# Patient Record
Sex: Female | Born: 1985 | Race: White | Hispanic: No | Marital: Single | Smoking: Never smoker
Health system: Southern US, Community
[De-identification: ages and names within clinical notes are randomized; demographics above are authoritative.]

## PROBLEM LIST (undated history)

## (undated) DIAGNOSIS — N809 Endometriosis, unspecified: Secondary | ICD-10-CM

## (undated) DIAGNOSIS — D519 Vitamin B12 deficiency anemia, unspecified: Secondary | ICD-10-CM

## (undated) DIAGNOSIS — K279 Peptic ulcer, site unspecified, unspecified as acute or chronic, without hemorrhage or perforation: Secondary | ICD-10-CM

## (undated) DIAGNOSIS — E039 Hypothyroidism, unspecified: Secondary | ICD-10-CM

## (undated) DIAGNOSIS — K760 Fatty (change of) liver, not elsewhere classified: Secondary | ICD-10-CM

## (undated) DIAGNOSIS — G43909 Migraine, unspecified, not intractable, without status migrainosus: Secondary | ICD-10-CM

## (undated) DIAGNOSIS — F79 Unspecified intellectual disabilities: Secondary | ICD-10-CM

## (undated) DIAGNOSIS — F4310 Post-traumatic stress disorder, unspecified: Secondary | ICD-10-CM

## (undated) DIAGNOSIS — R748 Abnormal levels of other serum enzymes: Secondary | ICD-10-CM

## (undated) DIAGNOSIS — G8929 Other chronic pain: Secondary | ICD-10-CM

## (undated) DIAGNOSIS — D259 Leiomyoma of uterus, unspecified: Secondary | ICD-10-CM

## (undated) DIAGNOSIS — Z9189 Other specified personal risk factors, not elsewhere classified: Secondary | ICD-10-CM

## (undated) DIAGNOSIS — M549 Dorsalgia, unspecified: Secondary | ICD-10-CM

## (undated) DIAGNOSIS — R5382 Chronic fatigue, unspecified: Secondary | ICD-10-CM

## (undated) DIAGNOSIS — F32A Depression, unspecified: Secondary | ICD-10-CM

## (undated) DIAGNOSIS — R161 Splenomegaly, not elsewhere classified: Secondary | ICD-10-CM

## (undated) DIAGNOSIS — D219 Benign neoplasm of connective and other soft tissue, unspecified: Secondary | ICD-10-CM

## (undated) DIAGNOSIS — G9332 Myalgic encephalomyelitis/chronic fatigue syndrome: Secondary | ICD-10-CM

## (undated) DIAGNOSIS — M419 Scoliosis, unspecified: Secondary | ICD-10-CM

## (undated) DIAGNOSIS — F329 Major depressive disorder, single episode, unspecified: Secondary | ICD-10-CM

## (undated) DIAGNOSIS — K5900 Constipation, unspecified: Secondary | ICD-10-CM

## (undated) HISTORY — DX: Post-traumatic stress disorder, unspecified: F43.10

## (undated) HISTORY — DX: Constipation, unspecified: K59.00

## (undated) HISTORY — DX: Hypothyroidism, unspecified: E03.9

## (undated) HISTORY — DX: Peptic ulcer, site unspecified, unspecified as acute or chronic, without hemorrhage or perforation: K27.9

## (undated) HISTORY — DX: Abnormal levels of other serum enzymes: R74.8

## (undated) HISTORY — DX: Dorsalgia, unspecified: M54.9

## (undated) HISTORY — PX: OVARIAN CYST REMOVAL: SHX89

## (undated) HISTORY — DX: Chronic fatigue, unspecified: R53.82

## (undated) HISTORY — DX: Endometriosis, unspecified: N80.9

## (undated) HISTORY — DX: Major depressive disorder, single episode, unspecified: F32.9

## (undated) HISTORY — DX: Myalgic encephalomyelitis/chronic fatigue syndrome: G93.32

## (undated) HISTORY — DX: Benign neoplasm of connective and other soft tissue, unspecified: D21.9

## (undated) HISTORY — DX: Depression, unspecified: F32.A

## (undated) HISTORY — DX: Leiomyoma of uterus, unspecified: D25.9

## (undated) HISTORY — PX: ABDOMINAL HYSTERECTOMY: SHX81

## (undated) HISTORY — DX: Other specified personal risk factors, not elsewhere classified: Z91.89

## (undated) HISTORY — DX: Other chronic pain: G89.29

## (undated) HISTORY — DX: Splenomegaly, not elsewhere classified: R16.1

## (undated) HISTORY — DX: Scoliosis, unspecified: M41.9

## (undated) HISTORY — DX: Fatty (change of) liver, not elsewhere classified: K76.0

## (undated) HISTORY — DX: Vitamin B12 deficiency anemia, unspecified: D51.9

## (undated) HISTORY — DX: Unspecified intellectual disabilities: F79

## (undated) HISTORY — PX: BACK SURGERY: SHX140

---

## 2004-06-20 ENCOUNTER — Ambulatory Visit: Payer: Self-pay | Admitting: Family Medicine

## 2004-09-26 ENCOUNTER — Ambulatory Visit: Payer: Self-pay | Admitting: Obstetrics and Gynecology

## 2005-01-16 ENCOUNTER — Ambulatory Visit: Payer: Self-pay | Admitting: Internal Medicine

## 2005-07-13 ENCOUNTER — Emergency Department: Payer: Self-pay | Admitting: Emergency Medicine

## 2006-04-16 ENCOUNTER — Ambulatory Visit: Payer: Self-pay | Admitting: Psychiatry

## 2006-04-27 ENCOUNTER — Ambulatory Visit: Payer: Self-pay | Admitting: Gastroenterology

## 2006-04-27 HISTORY — PX: COLONOSCOPY: SHX174

## 2006-05-25 ENCOUNTER — Ambulatory Visit: Payer: Self-pay | Admitting: Psychiatry

## 2007-03-03 ENCOUNTER — Emergency Department: Payer: Self-pay | Admitting: Emergency Medicine

## 2007-05-25 ENCOUNTER — Ambulatory Visit: Payer: Self-pay | Admitting: Pain Medicine

## 2007-06-01 ENCOUNTER — Ambulatory Visit: Payer: Self-pay | Admitting: Pain Medicine

## 2007-07-07 ENCOUNTER — Ambulatory Visit: Payer: Self-pay | Admitting: Pain Medicine

## 2007-09-22 ENCOUNTER — Ambulatory Visit: Payer: Self-pay | Admitting: Pain Medicine

## 2007-09-28 ENCOUNTER — Ambulatory Visit: Payer: Self-pay | Admitting: Pain Medicine

## 2010-04-06 HISTORY — PX: INTRAUTERINE DEVICE INSERTION: SHX323

## 2011-04-29 DIAGNOSIS — N920 Excessive and frequent menstruation with regular cycle: Secondary | ICD-10-CM | POA: Diagnosis not present

## 2011-04-30 DIAGNOSIS — G4452 New daily persistent headache (NDPH): Secondary | ICD-10-CM | POA: Diagnosis not present

## 2011-05-05 DIAGNOSIS — R5382 Chronic fatigue, unspecified: Secondary | ICD-10-CM | POA: Diagnosis not present

## 2011-05-05 DIAGNOSIS — G4489 Other headache syndrome: Secondary | ICD-10-CM | POA: Diagnosis not present

## 2011-05-05 DIAGNOSIS — G4710 Hypersomnia, unspecified: Secondary | ICD-10-CM | POA: Diagnosis not present

## 2011-05-14 DIAGNOSIS — G44029 Chronic cluster headache, not intractable: Secondary | ICD-10-CM | POA: Diagnosis not present

## 2011-05-14 DIAGNOSIS — J029 Acute pharyngitis, unspecified: Secondary | ICD-10-CM | POA: Diagnosis not present

## 2011-05-14 DIAGNOSIS — T783XXA Angioneurotic edema, initial encounter: Secondary | ICD-10-CM | POA: Diagnosis not present

## 2011-05-20 DIAGNOSIS — Z049 Encounter for examination and observation for unspecified reason: Secondary | ICD-10-CM | POA: Diagnosis not present

## 2011-05-20 DIAGNOSIS — Z79899 Other long term (current) drug therapy: Secondary | ICD-10-CM | POA: Diagnosis not present

## 2011-05-20 DIAGNOSIS — G43719 Chronic migraine without aura, intractable, without status migrainosus: Secondary | ICD-10-CM | POA: Diagnosis not present

## 2011-06-01 DIAGNOSIS — G518 Other disorders of facial nerve: Secondary | ICD-10-CM | POA: Diagnosis not present

## 2011-06-01 DIAGNOSIS — R51 Headache: Secondary | ICD-10-CM | POA: Diagnosis not present

## 2011-06-01 DIAGNOSIS — M542 Cervicalgia: Secondary | ICD-10-CM | POA: Diagnosis not present

## 2011-06-01 DIAGNOSIS — IMO0001 Reserved for inherently not codable concepts without codable children: Secondary | ICD-10-CM | POA: Diagnosis not present

## 2011-07-02 DIAGNOSIS — M542 Cervicalgia: Secondary | ICD-10-CM | POA: Diagnosis not present

## 2011-07-02 DIAGNOSIS — IMO0001 Reserved for inherently not codable concepts without codable children: Secondary | ICD-10-CM | POA: Diagnosis not present

## 2011-07-02 DIAGNOSIS — G518 Other disorders of facial nerve: Secondary | ICD-10-CM | POA: Diagnosis not present

## 2011-07-02 DIAGNOSIS — R51 Headache: Secondary | ICD-10-CM | POA: Diagnosis not present

## 2011-07-16 DIAGNOSIS — G518 Other disorders of facial nerve: Secondary | ICD-10-CM | POA: Diagnosis not present

## 2011-07-16 DIAGNOSIS — R51 Headache: Secondary | ICD-10-CM | POA: Diagnosis not present

## 2011-07-16 DIAGNOSIS — IMO0001 Reserved for inherently not codable concepts without codable children: Secondary | ICD-10-CM | POA: Diagnosis not present

## 2011-07-16 DIAGNOSIS — M542 Cervicalgia: Secondary | ICD-10-CM | POA: Diagnosis not present

## 2011-07-27 DIAGNOSIS — R5382 Chronic fatigue, unspecified: Secondary | ICD-10-CM | POA: Diagnosis not present

## 2011-07-27 DIAGNOSIS — E559 Vitamin D deficiency, unspecified: Secondary | ICD-10-CM | POA: Diagnosis not present

## 2011-07-27 DIAGNOSIS — G4710 Hypersomnia, unspecified: Secondary | ICD-10-CM | POA: Diagnosis not present

## 2011-07-27 DIAGNOSIS — R5381 Other malaise: Secondary | ICD-10-CM | POA: Diagnosis not present

## 2011-07-27 DIAGNOSIS — J309 Allergic rhinitis, unspecified: Secondary | ICD-10-CM | POA: Diagnosis not present

## 2011-07-27 DIAGNOSIS — E039 Hypothyroidism, unspecified: Secondary | ICD-10-CM | POA: Diagnosis not present

## 2011-07-27 DIAGNOSIS — G4489 Other headache syndrome: Secondary | ICD-10-CM | POA: Diagnosis not present

## 2011-07-28 DIAGNOSIS — D649 Anemia, unspecified: Secondary | ICD-10-CM | POA: Diagnosis not present

## 2011-07-28 DIAGNOSIS — E039 Hypothyroidism, unspecified: Secondary | ICD-10-CM | POA: Diagnosis not present

## 2011-07-28 DIAGNOSIS — R5381 Other malaise: Secondary | ICD-10-CM | POA: Diagnosis not present

## 2011-07-28 DIAGNOSIS — E559 Vitamin D deficiency, unspecified: Secondary | ICD-10-CM | POA: Diagnosis not present

## 2011-07-29 DIAGNOSIS — N912 Amenorrhea, unspecified: Secondary | ICD-10-CM | POA: Diagnosis not present

## 2011-08-05 DIAGNOSIS — J309 Allergic rhinitis, unspecified: Secondary | ICD-10-CM | POA: Diagnosis not present

## 2011-08-05 DIAGNOSIS — R300 Dysuria: Secondary | ICD-10-CM | POA: Diagnosis not present

## 2011-08-05 DIAGNOSIS — R5381 Other malaise: Secondary | ICD-10-CM | POA: Diagnosis not present

## 2011-08-05 DIAGNOSIS — R110 Nausea: Secondary | ICD-10-CM | POA: Diagnosis not present

## 2011-08-05 DIAGNOSIS — E559 Vitamin D deficiency, unspecified: Secondary | ICD-10-CM | POA: Diagnosis not present

## 2011-08-05 DIAGNOSIS — R109 Unspecified abdominal pain: Secondary | ICD-10-CM | POA: Diagnosis not present

## 2011-08-05 DIAGNOSIS — N390 Urinary tract infection, site not specified: Secondary | ICD-10-CM | POA: Diagnosis not present

## 2011-08-05 DIAGNOSIS — R5383 Other fatigue: Secondary | ICD-10-CM | POA: Diagnosis not present

## 2011-08-14 DIAGNOSIS — R109 Unspecified abdominal pain: Secondary | ICD-10-CM | POA: Diagnosis not present

## 2011-08-20 DIAGNOSIS — R300 Dysuria: Secondary | ICD-10-CM | POA: Diagnosis not present

## 2011-08-20 DIAGNOSIS — R5381 Other malaise: Secondary | ICD-10-CM | POA: Diagnosis not present

## 2011-08-20 DIAGNOSIS — N390 Urinary tract infection, site not specified: Secondary | ICD-10-CM | POA: Diagnosis not present

## 2011-08-20 DIAGNOSIS — R110 Nausea: Secondary | ICD-10-CM | POA: Diagnosis not present

## 2011-08-20 DIAGNOSIS — J309 Allergic rhinitis, unspecified: Secondary | ICD-10-CM | POA: Diagnosis not present

## 2011-09-01 DIAGNOSIS — N390 Urinary tract infection, site not specified: Secondary | ICD-10-CM | POA: Diagnosis not present

## 2011-09-01 DIAGNOSIS — J309 Allergic rhinitis, unspecified: Secondary | ICD-10-CM | POA: Diagnosis not present

## 2011-09-28 DIAGNOSIS — N920 Excessive and frequent menstruation with regular cycle: Secondary | ICD-10-CM | POA: Diagnosis not present

## 2011-10-16 ENCOUNTER — Emergency Department: Payer: Self-pay | Admitting: Emergency Medicine

## 2011-10-16 DIAGNOSIS — N390 Urinary tract infection, site not specified: Secondary | ICD-10-CM | POA: Diagnosis not present

## 2011-10-16 DIAGNOSIS — R34 Anuria and oliguria: Secondary | ICD-10-CM | POA: Diagnosis not present

## 2011-10-16 DIAGNOSIS — I10 Essential (primary) hypertension: Secondary | ICD-10-CM | POA: Diagnosis not present

## 2011-10-16 DIAGNOSIS — Z79899 Other long term (current) drug therapy: Secondary | ICD-10-CM | POA: Diagnosis not present

## 2011-10-16 DIAGNOSIS — R109 Unspecified abdominal pain: Secondary | ICD-10-CM | POA: Diagnosis not present

## 2011-10-16 DIAGNOSIS — M545 Low back pain: Secondary | ICD-10-CM | POA: Diagnosis not present

## 2011-10-16 DIAGNOSIS — R5381 Other malaise: Secondary | ICD-10-CM | POA: Diagnosis not present

## 2011-10-16 LAB — CBC
HGB: 13.9 g/dL (ref 12.0–16.0)
MCH: 29.1 pg (ref 26.0–34.0)
MCHC: 34.1 g/dL (ref 32.0–36.0)
Platelet: 218 10*3/uL (ref 150–440)
RDW: 13 % (ref 11.5–14.5)

## 2011-10-16 LAB — URINALYSIS, COMPLETE
Bilirubin,UR: NEGATIVE
Glucose,UR: NEGATIVE mg/dL (ref 0–75)
Ph: 5 (ref 4.5–8.0)
Protein: NEGATIVE
Specific Gravity: 1.021 (ref 1.003–1.030)

## 2011-10-16 LAB — BASIC METABOLIC PANEL
Anion Gap: 9 (ref 7–16)
BUN: 8 mg/dL (ref 7–18)
Calcium, Total: 8.9 mg/dL (ref 8.5–10.1)
EGFR (Non-African Amer.): >60
Glucose: 95 mg/dL (ref 65–99)
Osmolality: 279 (ref 275–301)
Potassium: 3.5 mmol/L (ref 3.5–5.1)

## 2011-10-28 DIAGNOSIS — N920 Excessive and frequent menstruation with regular cycle: Secondary | ICD-10-CM | POA: Diagnosis not present

## 2011-10-28 DIAGNOSIS — Z3009 Encounter for other general counseling and advice on contraception: Secondary | ICD-10-CM | POA: Diagnosis not present

## 2011-11-19 DIAGNOSIS — Z79899 Other long term (current) drug therapy: Secondary | ICD-10-CM | POA: Diagnosis not present

## 2011-11-19 DIAGNOSIS — N390 Urinary tract infection, site not specified: Secondary | ICD-10-CM | POA: Diagnosis not present

## 2011-11-19 DIAGNOSIS — Q619 Cystic kidney disease, unspecified: Secondary | ICD-10-CM | POA: Diagnosis not present

## 2011-11-19 DIAGNOSIS — M4120 Other idiopathic scoliosis, site unspecified: Secondary | ICD-10-CM | POA: Diagnosis not present

## 2011-11-19 DIAGNOSIS — N281 Cyst of kidney, acquired: Secondary | ICD-10-CM | POA: Diagnosis not present

## 2011-12-10 ENCOUNTER — Emergency Department (HOSPITAL_COMMUNITY)
Admission: EM | Admit: 2011-12-10 | Discharge: 2011-12-14 | Disposition: A | Discharge status: Home / Self Care | Payer: Medicare Other | Attending: Emergency Medicine | Admitting: Emergency Medicine

## 2011-12-10 ENCOUNTER — Encounter (HOSPITAL_COMMUNITY): Payer: Self-pay | Admitting: Emergency Medicine

## 2011-12-10 DIAGNOSIS — R51 Headache: Secondary | ICD-10-CM | POA: Insufficient documentation

## 2011-12-10 HISTORY — DX: Migraine, unspecified, not intractable, without status migrainosus: G43.909

## 2011-12-10 LAB — URINALYSIS, ROUTINE W REFLEX MICROSCOPIC
Glucose, UA: NEGATIVE mg/dL
Hgb urine dipstick: NEGATIVE
Leukocytes, UA: NEGATIVE
Protein, ur: NEGATIVE mg/dL
Specific Gravity, Urine: 1.031 — ABNORMAL HIGH (ref 1.005–1.030)
pH: 6 (ref 5.0–8.0)

## 2011-12-10 LAB — POCT PREGNANCY, URINE: Preg Test, Ur: NEGATIVE

## 2011-12-10 NOTE — ED Notes (Signed)
Patient left with her grand mother, she was her ride and had to get home to her husband. Will return if symptoms get worse.

## 2011-12-10 NOTE — ED Notes (Signed)
H/a x a couple of days  Will not go away and last night her hand had a spasm

## 2011-12-21 DIAGNOSIS — G4489 Other headache syndrome: Secondary | ICD-10-CM | POA: Diagnosis not present

## 2011-12-21 DIAGNOSIS — R110 Nausea: Secondary | ICD-10-CM | POA: Diagnosis not present

## 2011-12-21 DIAGNOSIS — R109 Unspecified abdominal pain: Secondary | ICD-10-CM | POA: Diagnosis not present

## 2011-12-21 DIAGNOSIS — G4710 Hypersomnia, unspecified: Secondary | ICD-10-CM | POA: Diagnosis not present

## 2011-12-21 DIAGNOSIS — F4310 Post-traumatic stress disorder, unspecified: Secondary | ICD-10-CM | POA: Diagnosis not present

## 2012-01-20 ENCOUNTER — Ambulatory Visit: Payer: Self-pay | Admitting: Pain Medicine

## 2012-01-20 DIAGNOSIS — Z9181 History of falling: Secondary | ICD-10-CM | POA: Diagnosis not present

## 2012-01-20 DIAGNOSIS — R51 Headache: Secondary | ICD-10-CM | POA: Diagnosis not present

## 2012-01-20 DIAGNOSIS — F329 Major depressive disorder, single episode, unspecified: Secondary | ICD-10-CM | POA: Diagnosis not present

## 2012-01-20 DIAGNOSIS — E039 Hypothyroidism, unspecified: Secondary | ICD-10-CM | POA: Diagnosis not present

## 2012-01-20 DIAGNOSIS — M531 Cervicobrachial syndrome: Secondary | ICD-10-CM | POA: Diagnosis not present

## 2012-01-20 DIAGNOSIS — M4120 Other idiopathic scoliosis, site unspecified: Secondary | ICD-10-CM | POA: Diagnosis not present

## 2012-01-20 DIAGNOSIS — G40909 Epilepsy, unspecified, not intractable, without status epilepticus: Secondary | ICD-10-CM | POA: Diagnosis not present

## 2012-01-20 DIAGNOSIS — R5382 Chronic fatigue, unspecified: Secondary | ICD-10-CM | POA: Diagnosis not present

## 2012-01-20 DIAGNOSIS — G43909 Migraine, unspecified, not intractable, without status migrainosus: Secondary | ICD-10-CM | POA: Diagnosis not present

## 2012-01-20 DIAGNOSIS — M542 Cervicalgia: Secondary | ICD-10-CM | POA: Diagnosis not present

## 2012-01-20 DIAGNOSIS — Z9889 Other specified postprocedural states: Secondary | ICD-10-CM | POA: Diagnosis not present

## 2012-01-20 DIAGNOSIS — IMO0001 Reserved for inherently not codable concepts without codable children: Secondary | ICD-10-CM | POA: Diagnosis not present

## 2012-01-20 DIAGNOSIS — M79609 Pain in unspecified limb: Secondary | ICD-10-CM | POA: Diagnosis not present

## 2012-01-20 DIAGNOSIS — R630 Anorexia: Secondary | ICD-10-CM | POA: Diagnosis not present

## 2012-01-27 DIAGNOSIS — Z3009 Encounter for other general counseling and advice on contraception: Secondary | ICD-10-CM | POA: Diagnosis not present

## 2012-01-27 DIAGNOSIS — N920 Excessive and frequent menstruation with regular cycle: Secondary | ICD-10-CM | POA: Diagnosis not present

## 2012-02-05 ENCOUNTER — Ambulatory Visit: Payer: Self-pay | Admitting: Pain Medicine

## 2012-02-05 DIAGNOSIS — M7989 Other specified soft tissue disorders: Secondary | ICD-10-CM | POA: Diagnosis not present

## 2012-02-05 DIAGNOSIS — M5137 Other intervertebral disc degeneration, lumbosacral region: Secondary | ICD-10-CM | POA: Diagnosis not present

## 2012-02-05 DIAGNOSIS — M47817 Spondylosis without myelopathy or radiculopathy, lumbosacral region: Secondary | ICD-10-CM | POA: Diagnosis not present

## 2012-02-05 DIAGNOSIS — Z981 Arthrodesis status: Secondary | ICD-10-CM | POA: Diagnosis not present

## 2012-02-05 DIAGNOSIS — M545 Low back pain, unspecified: Secondary | ICD-10-CM | POA: Diagnosis not present

## 2012-02-06 ENCOUNTER — Ambulatory Visit: Payer: Self-pay | Admitting: Pain Medicine

## 2012-02-06 DIAGNOSIS — M7989 Other specified soft tissue disorders: Secondary | ICD-10-CM | POA: Diagnosis not present

## 2012-02-06 DIAGNOSIS — M25579 Pain in unspecified ankle and joints of unspecified foot: Secondary | ICD-10-CM | POA: Diagnosis not present

## 2012-02-06 DIAGNOSIS — M25473 Effusion, unspecified ankle: Secondary | ICD-10-CM | POA: Diagnosis not present

## 2012-02-06 DIAGNOSIS — S82899A Other fracture of unspecified lower leg, initial encounter for closed fracture: Secondary | ICD-10-CM | POA: Diagnosis not present

## 2012-02-06 DIAGNOSIS — S92309A Fracture of unspecified metatarsal bone(s), unspecified foot, initial encounter for closed fracture: Secondary | ICD-10-CM | POA: Diagnosis not present

## 2012-02-06 DIAGNOSIS — M545 Low back pain: Secondary | ICD-10-CM | POA: Diagnosis not present

## 2012-02-06 DIAGNOSIS — M25476 Effusion, unspecified foot: Secondary | ICD-10-CM | POA: Diagnosis not present

## 2012-02-08 DIAGNOSIS — M25579 Pain in unspecified ankle and joints of unspecified foot: Secondary | ICD-10-CM | POA: Diagnosis not present

## 2012-02-08 DIAGNOSIS — M25476 Effusion, unspecified foot: Secondary | ICD-10-CM | POA: Diagnosis not present

## 2012-02-08 DIAGNOSIS — S92309A Fracture of unspecified metatarsal bone(s), unspecified foot, initial encounter for closed fracture: Secondary | ICD-10-CM | POA: Diagnosis not present

## 2012-02-19 DIAGNOSIS — S82899A Other fracture of unspecified lower leg, initial encounter for closed fracture: Secondary | ICD-10-CM | POA: Diagnosis not present

## 2012-02-19 DIAGNOSIS — M79609 Pain in unspecified limb: Secondary | ICD-10-CM | POA: Diagnosis not present

## 2012-02-23 ENCOUNTER — Ambulatory Visit: Payer: Self-pay | Admitting: Pain Medicine

## 2012-02-23 DIAGNOSIS — R51 Headache: Secondary | ICD-10-CM | POA: Diagnosis not present

## 2012-02-23 DIAGNOSIS — R569 Unspecified convulsions: Secondary | ICD-10-CM | POA: Diagnosis not present

## 2012-02-23 DIAGNOSIS — M542 Cervicalgia: Secondary | ICD-10-CM | POA: Diagnosis not present

## 2012-02-23 DIAGNOSIS — S92909A Unspecified fracture of unspecified foot, initial encounter for closed fracture: Secondary | ICD-10-CM | POA: Diagnosis not present

## 2012-02-23 DIAGNOSIS — M531 Cervicobrachial syndrome: Secondary | ICD-10-CM | POA: Diagnosis not present

## 2012-02-23 DIAGNOSIS — IMO0001 Reserved for inherently not codable concepts without codable children: Secondary | ICD-10-CM | POA: Diagnosis not present

## 2012-02-23 DIAGNOSIS — M79609 Pain in unspecified limb: Secondary | ICD-10-CM | POA: Diagnosis not present

## 2012-02-23 DIAGNOSIS — S82899A Other fracture of unspecified lower leg, initial encounter for closed fracture: Secondary | ICD-10-CM | POA: Diagnosis not present

## 2012-03-02 ENCOUNTER — Ambulatory Visit: Payer: Self-pay | Admitting: Pain Medicine

## 2012-03-02 DIAGNOSIS — M542 Cervicalgia: Secondary | ICD-10-CM | POA: Diagnosis not present

## 2012-03-02 DIAGNOSIS — M79609 Pain in unspecified limb: Secondary | ICD-10-CM | POA: Diagnosis not present

## 2012-03-08 DIAGNOSIS — M79609 Pain in unspecified limb: Secondary | ICD-10-CM | POA: Diagnosis not present

## 2012-03-15 DIAGNOSIS — R5383 Other fatigue: Secondary | ICD-10-CM | POA: Diagnosis not present

## 2012-03-15 DIAGNOSIS — F4310 Post-traumatic stress disorder, unspecified: Secondary | ICD-10-CM | POA: Diagnosis not present

## 2012-03-15 DIAGNOSIS — R5381 Other malaise: Secondary | ICD-10-CM | POA: Diagnosis not present

## 2012-03-15 DIAGNOSIS — R569 Unspecified convulsions: Secondary | ICD-10-CM | POA: Diagnosis not present

## 2012-03-15 DIAGNOSIS — G4489 Other headache syndrome: Secondary | ICD-10-CM | POA: Diagnosis not present

## 2012-03-22 DIAGNOSIS — M79609 Pain in unspecified limb: Secondary | ICD-10-CM | POA: Diagnosis not present

## 2012-03-24 ENCOUNTER — Ambulatory Visit: Payer: Self-pay | Admitting: Pain Medicine

## 2012-03-24 DIAGNOSIS — R51 Headache: Secondary | ICD-10-CM | POA: Diagnosis not present

## 2012-03-24 DIAGNOSIS — M79609 Pain in unspecified limb: Secondary | ICD-10-CM | POA: Diagnosis not present

## 2012-03-24 DIAGNOSIS — Z9889 Other specified postprocedural states: Secondary | ICD-10-CM | POA: Diagnosis not present

## 2012-03-24 DIAGNOSIS — IMO0001 Reserved for inherently not codable concepts without codable children: Secondary | ICD-10-CM | POA: Diagnosis not present

## 2012-03-24 DIAGNOSIS — M542 Cervicalgia: Secondary | ICD-10-CM | POA: Diagnosis not present

## 2012-03-24 DIAGNOSIS — M4120 Other idiopathic scoliosis, site unspecified: Secondary | ICD-10-CM | POA: Diagnosis not present

## 2012-04-08 ENCOUNTER — Ambulatory Visit: Payer: Self-pay | Admitting: Internal Medicine

## 2012-04-08 DIAGNOSIS — R569 Unspecified convulsions: Secondary | ICD-10-CM | POA: Diagnosis not present

## 2012-04-08 DIAGNOSIS — R51 Headache: Secondary | ICD-10-CM | POA: Diagnosis not present

## 2012-04-26 ENCOUNTER — Ambulatory Visit: Payer: Self-pay | Admitting: Pain Medicine

## 2012-04-26 DIAGNOSIS — IMO0001 Reserved for inherently not codable concepts without codable children: Secondary | ICD-10-CM | POA: Diagnosis not present

## 2012-04-26 DIAGNOSIS — R51 Headache: Secondary | ICD-10-CM | POA: Diagnosis not present

## 2012-04-26 DIAGNOSIS — M79609 Pain in unspecified limb: Secondary | ICD-10-CM | POA: Diagnosis not present

## 2012-04-26 DIAGNOSIS — M542 Cervicalgia: Secondary | ICD-10-CM | POA: Diagnosis not present

## 2012-04-26 DIAGNOSIS — Z79899 Other long term (current) drug therapy: Secondary | ICD-10-CM | POA: Diagnosis not present

## 2012-04-27 DIAGNOSIS — R5381 Other malaise: Secondary | ICD-10-CM | POA: Diagnosis not present

## 2012-04-27 DIAGNOSIS — F329 Major depressive disorder, single episode, unspecified: Secondary | ICD-10-CM | POA: Diagnosis not present

## 2012-04-27 DIAGNOSIS — G4489 Other headache syndrome: Secondary | ICD-10-CM | POA: Diagnosis not present

## 2012-04-27 DIAGNOSIS — J309 Allergic rhinitis, unspecified: Secondary | ICD-10-CM | POA: Diagnosis not present

## 2012-04-27 DIAGNOSIS — R569 Unspecified convulsions: Secondary | ICD-10-CM | POA: Diagnosis not present

## 2012-04-29 ENCOUNTER — Ambulatory Visit: Payer: Self-pay | Admitting: Neurology

## 2012-04-29 DIAGNOSIS — R0609 Other forms of dyspnea: Secondary | ICD-10-CM | POA: Diagnosis not present

## 2012-04-29 DIAGNOSIS — R569 Unspecified convulsions: Secondary | ICD-10-CM | POA: Diagnosis not present

## 2012-04-29 DIAGNOSIS — R51 Headache: Secondary | ICD-10-CM | POA: Diagnosis not present

## 2012-04-29 DIAGNOSIS — M6281 Muscle weakness (generalized): Secondary | ICD-10-CM | POA: Diagnosis not present

## 2012-04-29 DIAGNOSIS — R209 Unspecified disturbances of skin sensation: Secondary | ICD-10-CM | POA: Diagnosis not present

## 2012-05-02 DIAGNOSIS — Z3009 Encounter for other general counseling and advice on contraception: Secondary | ICD-10-CM | POA: Diagnosis not present

## 2012-05-03 ENCOUNTER — Ambulatory Visit: Payer: Self-pay | Admitting: Neurology

## 2012-05-03 DIAGNOSIS — R209 Unspecified disturbances of skin sensation: Secondary | ICD-10-CM | POA: Diagnosis not present

## 2012-05-03 DIAGNOSIS — R5383 Other fatigue: Secondary | ICD-10-CM | POA: Diagnosis not present

## 2012-05-03 DIAGNOSIS — M6281 Muscle weakness (generalized): Secondary | ICD-10-CM | POA: Diagnosis not present

## 2012-05-03 DIAGNOSIS — R51 Headache: Secondary | ICD-10-CM | POA: Diagnosis not present

## 2012-05-03 DIAGNOSIS — R0609 Other forms of dyspnea: Secondary | ICD-10-CM | POA: Diagnosis not present

## 2012-05-23 ENCOUNTER — Ambulatory Visit: Payer: Self-pay | Admitting: Pain Medicine

## 2012-05-23 DIAGNOSIS — M531 Cervicobrachial syndrome: Secondary | ICD-10-CM | POA: Diagnosis not present

## 2012-05-23 DIAGNOSIS — R51 Headache: Secondary | ICD-10-CM | POA: Diagnosis not present

## 2012-05-25 DIAGNOSIS — F4325 Adjustment disorder with mixed disturbance of emotions and conduct: Secondary | ICD-10-CM | POA: Diagnosis not present

## 2012-06-18 ENCOUNTER — Emergency Department: Payer: Self-pay | Admitting: Emergency Medicine

## 2012-06-18 DIAGNOSIS — N10 Acute tubulo-interstitial nephritis: Secondary | ICD-10-CM | POA: Diagnosis not present

## 2012-06-18 DIAGNOSIS — N12 Tubulo-interstitial nephritis, not specified as acute or chronic: Secondary | ICD-10-CM | POA: Diagnosis not present

## 2012-06-18 LAB — URINALYSIS, COMPLETE
Blood: NEGATIVE
Ketone: NEGATIVE
Nitrite: NEGATIVE
Ph: 5 (ref 4.5–8.0)
Protein: 30
RBC,UR: 5 /HPF (ref 0–5)
Squamous Epithelial: 62
WBC UR: 38 /HPF (ref 0–5)

## 2012-06-18 LAB — COMPREHENSIVE METABOLIC PANEL
Albumin: 4.2 g/dL (ref 3.4–5.0)
Alkaline Phosphatase: 134 U/L (ref 50–136)
Anion Gap: 5 — ABNORMAL LOW (ref 7–16)
Bilirubin,Total: 0.5 mg/dL (ref 0.2–1.0)
EGFR (African American): >60
Glucose: 101 mg/dL — ABNORMAL HIGH (ref 65–99)
Osmolality: 280 (ref 275–301)
Potassium: 3.7 mmol/L (ref 3.5–5.1)
SGOT(AST): 15 U/L (ref 15–37)
SGPT (ALT): 17 U/L (ref 12–78)
Sodium: 141 mmol/L (ref 136–145)
Total Protein: 8.1 g/dL (ref 6.4–8.2)

## 2012-06-18 LAB — CBC
MCH: 29.5 pg (ref 26.0–34.0)
MCHC: 34.3 g/dL (ref 32.0–36.0)
MCV: 86 fL (ref 80–100)
Platelet: 222 10*3/uL (ref 150–440)
RBC: 5.11 10*6/uL (ref 3.80–5.20)
WBC: 8.2 10*3/uL (ref 3.6–11.0)

## 2012-06-18 LAB — PREGNANCY, URINE: Pregnancy Test, Urine: NEGATIVE m[IU]/mL

## 2012-06-18 LAB — LIPASE, BLOOD: Lipase: 134 U/L (ref 73–393)

## 2012-07-06 DIAGNOSIS — R51 Headache: Secondary | ICD-10-CM | POA: Diagnosis not present

## 2012-07-25 DIAGNOSIS — Z3009 Encounter for other general counseling and advice on contraception: Secondary | ICD-10-CM | POA: Diagnosis not present

## 2012-09-11 DIAGNOSIS — R350 Frequency of micturition: Secondary | ICD-10-CM | POA: Diagnosis not present

## 2012-09-11 DIAGNOSIS — R300 Dysuria: Secondary | ICD-10-CM | POA: Diagnosis not present

## 2012-09-11 DIAGNOSIS — R110 Nausea: Secondary | ICD-10-CM | POA: Diagnosis not present

## 2012-09-11 DIAGNOSIS — N390 Urinary tract infection, site not specified: Secondary | ICD-10-CM | POA: Diagnosis not present

## 2012-09-14 DIAGNOSIS — R300 Dysuria: Secondary | ICD-10-CM | POA: Diagnosis not present

## 2012-09-14 DIAGNOSIS — N390 Urinary tract infection, site not specified: Secondary | ICD-10-CM | POA: Diagnosis not present

## 2012-09-25 DIAGNOSIS — R1110 Vomiting, unspecified: Secondary | ICD-10-CM | POA: Diagnosis not present

## 2012-09-25 DIAGNOSIS — N390 Urinary tract infection, site not specified: Secondary | ICD-10-CM | POA: Diagnosis not present

## 2012-10-06 DIAGNOSIS — K920 Hematemesis: Secondary | ICD-10-CM | POA: Diagnosis not present

## 2012-10-06 DIAGNOSIS — N390 Urinary tract infection, site not specified: Secondary | ICD-10-CM | POA: Diagnosis not present

## 2012-10-06 DIAGNOSIS — R112 Nausea with vomiting, unspecified: Secondary | ICD-10-CM | POA: Diagnosis not present

## 2012-10-06 DIAGNOSIS — R1012 Left upper quadrant pain: Secondary | ICD-10-CM | POA: Diagnosis not present

## 2012-10-10 ENCOUNTER — Other Ambulatory Visit: Payer: Self-pay | Admitting: Internal Medicine

## 2012-10-10 DIAGNOSIS — R109 Unspecified abdominal pain: Secondary | ICD-10-CM

## 2012-10-10 DIAGNOSIS — R1012 Left upper quadrant pain: Secondary | ICD-10-CM

## 2012-10-12 DIAGNOSIS — D259 Leiomyoma of uterus, unspecified: Secondary | ICD-10-CM | POA: Diagnosis not present

## 2012-10-12 DIAGNOSIS — N949 Unspecified condition associated with female genital organs and menstrual cycle: Secondary | ICD-10-CM | POA: Diagnosis not present

## 2012-10-14 ENCOUNTER — Ambulatory Visit
Admission: RE | Admit: 2012-10-14 | Discharge: 2012-10-14 | Disposition: A | Discharge status: Home / Self Care | Payer: Medicare Other | Source: Ambulatory Visit | Attending: Internal Medicine | Admitting: Internal Medicine

## 2012-10-14 ENCOUNTER — Other Ambulatory Visit: Payer: Self-pay | Admitting: Internal Medicine

## 2012-10-14 DIAGNOSIS — R1012 Left upper quadrant pain: Secondary | ICD-10-CM

## 2012-10-14 DIAGNOSIS — R110 Nausea: Secondary | ICD-10-CM | POA: Diagnosis not present

## 2012-10-14 DIAGNOSIS — R109 Unspecified abdominal pain: Secondary | ICD-10-CM

## 2012-10-19 DIAGNOSIS — N949 Unspecified condition associated with female genital organs and menstrual cycle: Secondary | ICD-10-CM | POA: Diagnosis not present

## 2012-10-19 DIAGNOSIS — K5900 Constipation, unspecified: Secondary | ICD-10-CM | POA: Diagnosis not present

## 2012-10-19 DIAGNOSIS — D259 Leiomyoma of uterus, unspecified: Secondary | ICD-10-CM | POA: Diagnosis not present

## 2012-10-25 DIAGNOSIS — R413 Other amnesia: Secondary | ICD-10-CM | POA: Diagnosis not present

## 2012-10-25 DIAGNOSIS — F329 Major depressive disorder, single episode, unspecified: Secondary | ICD-10-CM | POA: Diagnosis not present

## 2012-10-25 DIAGNOSIS — R51 Headache: Secondary | ICD-10-CM | POA: Diagnosis not present

## 2012-10-26 DIAGNOSIS — Z309 Encounter for contraceptive management, unspecified: Secondary | ICD-10-CM | POA: Diagnosis not present

## 2012-10-26 DIAGNOSIS — K5900 Constipation, unspecified: Secondary | ICD-10-CM | POA: Diagnosis not present

## 2012-10-27 DIAGNOSIS — R1012 Left upper quadrant pain: Secondary | ICD-10-CM | POA: Diagnosis not present

## 2012-10-31 DIAGNOSIS — F4323 Adjustment disorder with mixed anxiety and depressed mood: Secondary | ICD-10-CM | POA: Diagnosis not present

## 2012-10-31 DIAGNOSIS — R5382 Chronic fatigue, unspecified: Secondary | ICD-10-CM | POA: Diagnosis not present

## 2012-10-31 DIAGNOSIS — G4489 Other headache syndrome: Secondary | ICD-10-CM | POA: Diagnosis not present

## 2012-10-31 DIAGNOSIS — R112 Nausea with vomiting, unspecified: Secondary | ICD-10-CM | POA: Diagnosis not present

## 2012-10-31 DIAGNOSIS — R000 Tachycardia, unspecified: Secondary | ICD-10-CM | POA: Diagnosis not present

## 2012-10-31 DIAGNOSIS — K5900 Constipation, unspecified: Secondary | ICD-10-CM | POA: Diagnosis not present

## 2012-10-31 DIAGNOSIS — R569 Unspecified convulsions: Secondary | ICD-10-CM | POA: Diagnosis not present

## 2012-10-31 DIAGNOSIS — R109 Unspecified abdominal pain: Secondary | ICD-10-CM | POA: Diagnosis not present

## 2012-10-31 DIAGNOSIS — R413 Other amnesia: Secondary | ICD-10-CM | POA: Diagnosis not present

## 2012-11-09 DIAGNOSIS — G4489 Other headache syndrome: Secondary | ICD-10-CM | POA: Diagnosis not present

## 2012-11-09 DIAGNOSIS — R109 Unspecified abdominal pain: Secondary | ICD-10-CM | POA: Diagnosis not present

## 2012-11-09 DIAGNOSIS — R197 Diarrhea, unspecified: Secondary | ICD-10-CM | POA: Diagnosis not present

## 2012-11-09 DIAGNOSIS — R7301 Impaired fasting glucose: Secondary | ICD-10-CM | POA: Diagnosis not present

## 2012-11-09 DIAGNOSIS — R634 Abnormal weight loss: Secondary | ICD-10-CM | POA: Diagnosis not present

## 2012-11-09 DIAGNOSIS — R112 Nausea with vomiting, unspecified: Secondary | ICD-10-CM | POA: Diagnosis not present

## 2012-11-09 DIAGNOSIS — E860 Dehydration: Secondary | ICD-10-CM | POA: Diagnosis not present

## 2012-11-09 DIAGNOSIS — R34 Anuria and oliguria: Secondary | ICD-10-CM | POA: Diagnosis not present

## 2012-11-10 DIAGNOSIS — R112 Nausea with vomiting, unspecified: Secondary | ICD-10-CM | POA: Diagnosis not present

## 2012-11-10 DIAGNOSIS — R109 Unspecified abdominal pain: Secondary | ICD-10-CM | POA: Diagnosis not present

## 2012-11-10 DIAGNOSIS — R635 Abnormal weight gain: Secondary | ICD-10-CM | POA: Diagnosis not present

## 2012-11-10 DIAGNOSIS — R000 Tachycardia, unspecified: Secondary | ICD-10-CM | POA: Diagnosis not present

## 2012-11-14 ENCOUNTER — Ambulatory Visit: Payer: Self-pay | Admitting: Internal Medicine

## 2012-11-14 DIAGNOSIS — R112 Nausea with vomiting, unspecified: Secondary | ICD-10-CM | POA: Diagnosis not present

## 2012-11-14 DIAGNOSIS — R1011 Right upper quadrant pain: Secondary | ICD-10-CM | POA: Diagnosis not present

## 2012-11-18 DIAGNOSIS — R112 Nausea with vomiting, unspecified: Secondary | ICD-10-CM | POA: Diagnosis not present

## 2012-11-18 DIAGNOSIS — R413 Other amnesia: Secondary | ICD-10-CM | POA: Diagnosis not present

## 2012-11-18 DIAGNOSIS — F4323 Adjustment disorder with mixed anxiety and depressed mood: Secondary | ICD-10-CM | POA: Diagnosis not present

## 2012-11-18 DIAGNOSIS — E860 Dehydration: Secondary | ICD-10-CM | POA: Diagnosis not present

## 2012-11-18 DIAGNOSIS — R634 Abnormal weight loss: Secondary | ICD-10-CM | POA: Diagnosis not present

## 2012-11-29 DIAGNOSIS — R0789 Other chest pain: Secondary | ICD-10-CM | POA: Diagnosis not present

## 2012-11-29 DIAGNOSIS — G4700 Insomnia, unspecified: Secondary | ICD-10-CM | POA: Diagnosis not present

## 2012-11-29 DIAGNOSIS — E119 Type 2 diabetes mellitus without complications: Secondary | ICD-10-CM | POA: Diagnosis not present

## 2012-11-29 DIAGNOSIS — H9209 Otalgia, unspecified ear: Secondary | ICD-10-CM | POA: Diagnosis not present

## 2012-11-29 DIAGNOSIS — J069 Acute upper respiratory infection, unspecified: Secondary | ICD-10-CM | POA: Diagnosis not present

## 2012-11-29 DIAGNOSIS — R5381 Other malaise: Secondary | ICD-10-CM | POA: Diagnosis not present

## 2012-12-07 ENCOUNTER — Emergency Department: Payer: Self-pay | Admitting: Emergency Medicine

## 2012-12-07 DIAGNOSIS — E039 Hypothyroidism, unspecified: Secondary | ICD-10-CM | POA: Diagnosis not present

## 2012-12-07 DIAGNOSIS — Z888 Allergy status to other drugs, medicaments and biological substances status: Secondary | ICD-10-CM | POA: Diagnosis not present

## 2012-12-07 DIAGNOSIS — R110 Nausea: Secondary | ICD-10-CM | POA: Diagnosis not present

## 2012-12-07 DIAGNOSIS — H6120 Impacted cerumen, unspecified ear: Secondary | ICD-10-CM | POA: Diagnosis not present

## 2012-12-07 DIAGNOSIS — J40 Bronchitis, not specified as acute or chronic: Secondary | ICD-10-CM | POA: Diagnosis not present

## 2012-12-07 DIAGNOSIS — H6980 Other specified disorders of Eustachian tube, unspecified ear: Secondary | ICD-10-CM | POA: Diagnosis not present

## 2012-12-07 DIAGNOSIS — J209 Acute bronchitis, unspecified: Secondary | ICD-10-CM | POA: Diagnosis not present

## 2012-12-07 DIAGNOSIS — Z7982 Long term (current) use of aspirin: Secondary | ICD-10-CM | POA: Diagnosis not present

## 2012-12-07 DIAGNOSIS — Z79899 Other long term (current) drug therapy: Secondary | ICD-10-CM | POA: Diagnosis not present

## 2012-12-14 DIAGNOSIS — R070 Pain in throat: Secondary | ICD-10-CM | POA: Diagnosis not present

## 2012-12-14 DIAGNOSIS — I889 Nonspecific lymphadenitis, unspecified: Secondary | ICD-10-CM | POA: Diagnosis not present

## 2012-12-14 DIAGNOSIS — B9789 Other viral agents as the cause of diseases classified elsewhere: Secondary | ICD-10-CM | POA: Diagnosis not present

## 2012-12-26 DIAGNOSIS — R220 Localized swelling, mass and lump, head: Secondary | ICD-10-CM | POA: Diagnosis not present

## 2013-01-13 DIAGNOSIS — G43019 Migraine without aura, intractable, without status migrainosus: Secondary | ICD-10-CM | POA: Diagnosis not present

## 2013-01-13 DIAGNOSIS — F330 Major depressive disorder, recurrent, mild: Secondary | ICD-10-CM | POA: Diagnosis not present

## 2013-01-13 DIAGNOSIS — R5382 Chronic fatigue, unspecified: Secondary | ICD-10-CM | POA: Diagnosis not present

## 2013-01-13 DIAGNOSIS — E860 Dehydration: Secondary | ICD-10-CM | POA: Diagnosis not present

## 2013-01-13 DIAGNOSIS — R569 Unspecified convulsions: Secondary | ICD-10-CM | POA: Diagnosis not present

## 2013-01-13 DIAGNOSIS — D485 Neoplasm of uncertain behavior of skin: Secondary | ICD-10-CM | POA: Diagnosis not present

## 2013-01-24 DIAGNOSIS — I889 Nonspecific lymphadenitis, unspecified: Secondary | ICD-10-CM | POA: Diagnosis not present

## 2013-01-24 DIAGNOSIS — K5900 Constipation, unspecified: Secondary | ICD-10-CM | POA: Diagnosis not present

## 2013-01-24 DIAGNOSIS — N390 Urinary tract infection, site not specified: Secondary | ICD-10-CM | POA: Diagnosis not present

## 2013-01-24 DIAGNOSIS — R350 Frequency of micturition: Secondary | ICD-10-CM | POA: Diagnosis not present

## 2013-01-24 DIAGNOSIS — K921 Melena: Secondary | ICD-10-CM | POA: Diagnosis not present

## 2013-01-24 DIAGNOSIS — B9789 Other viral agents as the cause of diseases classified elsewhere: Secondary | ICD-10-CM | POA: Diagnosis not present

## 2013-01-24 DIAGNOSIS — Z113 Encounter for screening for infections with a predominantly sexual mode of transmission: Secondary | ICD-10-CM | POA: Diagnosis not present

## 2013-01-24 DIAGNOSIS — R5381 Other malaise: Secondary | ICD-10-CM | POA: Diagnosis not present

## 2013-01-24 DIAGNOSIS — Q680 Congenital deformity of sternocleidomastoid muscle: Secondary | ICD-10-CM | POA: Diagnosis not present

## 2013-01-30 DIAGNOSIS — R1032 Left lower quadrant pain: Secondary | ICD-10-CM | POA: Diagnosis not present

## 2013-01-30 DIAGNOSIS — R198 Other specified symptoms and signs involving the digestive system and abdomen: Secondary | ICD-10-CM | POA: Diagnosis not present

## 2013-01-30 DIAGNOSIS — R1031 Right lower quadrant pain: Secondary | ICD-10-CM | POA: Diagnosis not present

## 2013-01-30 DIAGNOSIS — K921 Melena: Secondary | ICD-10-CM | POA: Diagnosis not present

## 2013-01-31 DIAGNOSIS — R5382 Chronic fatigue, unspecified: Secondary | ICD-10-CM | POA: Diagnosis not present

## 2013-01-31 DIAGNOSIS — R070 Pain in throat: Secondary | ICD-10-CM | POA: Diagnosis not present

## 2013-01-31 DIAGNOSIS — G43019 Migraine without aura, intractable, without status migrainosus: Secondary | ICD-10-CM | POA: Diagnosis not present

## 2013-01-31 DIAGNOSIS — B370 Candidal stomatitis: Secondary | ICD-10-CM | POA: Diagnosis not present

## 2013-01-31 DIAGNOSIS — F330 Major depressive disorder, recurrent, mild: Secondary | ICD-10-CM | POA: Diagnosis not present

## 2013-01-31 DIAGNOSIS — K5900 Constipation, unspecified: Secondary | ICD-10-CM | POA: Diagnosis not present

## 2013-02-04 HISTORY — PX: COLONOSCOPY: SHX174

## 2013-02-04 HISTORY — PX: ESOPHAGOGASTRODUODENOSCOPY: SHX1529

## 2013-02-09 DIAGNOSIS — Z309 Encounter for contraceptive management, unspecified: Secondary | ICD-10-CM | POA: Diagnosis not present

## 2013-02-09 DIAGNOSIS — N644 Mastodynia: Secondary | ICD-10-CM | POA: Diagnosis not present

## 2013-02-10 ENCOUNTER — Ambulatory Visit: Payer: Self-pay | Admitting: Internal Medicine

## 2013-02-10 DIAGNOSIS — R51 Headache: Secondary | ICD-10-CM | POA: Diagnosis not present

## 2013-02-10 DIAGNOSIS — R112 Nausea with vomiting, unspecified: Secondary | ICD-10-CM | POA: Diagnosis not present

## 2013-02-10 DIAGNOSIS — J45909 Unspecified asthma, uncomplicated: Secondary | ICD-10-CM | POA: Diagnosis not present

## 2013-02-21 ENCOUNTER — Ambulatory Visit: Payer: Self-pay | Admitting: Gastroenterology

## 2013-02-21 DIAGNOSIS — K6289 Other specified diseases of anus and rectum: Secondary | ICD-10-CM | POA: Diagnosis not present

## 2013-02-21 DIAGNOSIS — K259 Gastric ulcer, unspecified as acute or chronic, without hemorrhage or perforation: Secondary | ICD-10-CM | POA: Diagnosis not present

## 2013-02-21 DIAGNOSIS — K921 Melena: Secondary | ICD-10-CM | POA: Diagnosis not present

## 2013-02-21 DIAGNOSIS — R198 Other specified symptoms and signs involving the digestive system and abdomen: Secondary | ICD-10-CM | POA: Diagnosis not present

## 2013-02-21 DIAGNOSIS — K625 Hemorrhage of anus and rectum: Secondary | ICD-10-CM | POA: Diagnosis not present

## 2013-02-21 DIAGNOSIS — K5289 Other specified noninfective gastroenteritis and colitis: Secondary | ICD-10-CM | POA: Diagnosis not present

## 2013-02-21 DIAGNOSIS — K269 Duodenal ulcer, unspecified as acute or chronic, without hemorrhage or perforation: Secondary | ICD-10-CM | POA: Diagnosis not present

## 2013-02-21 DIAGNOSIS — K2940 Chronic atrophic gastritis without bleeding: Secondary | ICD-10-CM | POA: Diagnosis not present

## 2013-02-21 DIAGNOSIS — Z888 Allergy status to other drugs, medicaments and biological substances status: Secondary | ICD-10-CM | POA: Diagnosis not present

## 2013-02-21 DIAGNOSIS — K2970 Gastritis, unspecified, without bleeding: Secondary | ICD-10-CM | POA: Diagnosis not present

## 2013-02-21 DIAGNOSIS — R1013 Epigastric pain: Secondary | ICD-10-CM | POA: Diagnosis not present

## 2013-02-22 ENCOUNTER — Ambulatory Visit: Payer: Self-pay | Admitting: Obstetrics and Gynecology

## 2013-02-22 DIAGNOSIS — L539 Erythematous condition, unspecified: Secondary | ICD-10-CM | POA: Diagnosis not present

## 2013-02-22 DIAGNOSIS — N644 Mastodynia: Secondary | ICD-10-CM | POA: Diagnosis not present

## 2013-02-23 DIAGNOSIS — K269 Duodenal ulcer, unspecified as acute or chronic, without hemorrhage or perforation: Secondary | ICD-10-CM | POA: Diagnosis not present

## 2013-02-23 DIAGNOSIS — K259 Gastric ulcer, unspecified as acute or chronic, without hemorrhage or perforation: Secondary | ICD-10-CM | POA: Diagnosis not present

## 2013-02-23 DIAGNOSIS — N644 Mastodynia: Secondary | ICD-10-CM | POA: Diagnosis not present

## 2013-02-23 DIAGNOSIS — N61 Mastitis without abscess: Secondary | ICD-10-CM | POA: Diagnosis not present

## 2013-02-24 DIAGNOSIS — R1084 Generalized abdominal pain: Secondary | ICD-10-CM | POA: Diagnosis not present

## 2013-02-24 DIAGNOSIS — R109 Unspecified abdominal pain: Secondary | ICD-10-CM | POA: Diagnosis not present

## 2013-02-24 DIAGNOSIS — Z0389 Encounter for observation for other suspected diseases and conditions ruled out: Secondary | ICD-10-CM | POA: Diagnosis not present

## 2013-02-24 DIAGNOSIS — R52 Pain, unspecified: Secondary | ICD-10-CM | POA: Diagnosis not present

## 2013-02-24 LAB — URINALYSIS, COMPLETE
Bilirubin,UR: NEGATIVE
Blood: NEGATIVE
RBC,UR: 2 /HPF (ref 0–5)
Specific Gravity: 1.018 (ref 1.003–1.030)
Squamous Epithelial: 7
WBC UR: 3 /HPF (ref 0–5)

## 2013-02-24 LAB — COMPREHENSIVE METABOLIC PANEL
Anion Gap: 6 — ABNORMAL LOW (ref 7–16)
Bilirubin,Total: 0.5 mg/dL (ref 0.2–1.0)
Calcium, Total: 9.2 mg/dL (ref 8.5–10.1)
Co2: 21 mmol/L (ref 21–32)
Creatinine: 0.73 mg/dL (ref 0.60–1.30)
Glucose: 97 mg/dL (ref 65–99)
Potassium: 3.3 mmol/L — ABNORMAL LOW (ref 3.5–5.1)
SGOT(AST): 20 U/L (ref 15–37)
SGPT (ALT): 20 U/L (ref 12–78)
Sodium: 142 mmol/L (ref 136–145)
Total Protein: 7.6 g/dL (ref 6.4–8.2)

## 2013-02-24 LAB — CBC WITH DIFFERENTIAL/PLATELET
Basophil %: 0.8 %
Eosinophil #: 0.1 10*3/uL (ref 0.0–0.7)
HGB: 14.6 g/dL (ref 12.0–16.0)
Lymphocyte #: 2.4 10*3/uL (ref 1.0–3.6)
Lymphocyte %: 35.8 %
MCHC: 34.2 g/dL (ref 32.0–36.0)
MCV: 84 fL (ref 80–100)
Monocyte #: 0.4 x10 3/mm (ref 0.2–0.9)
Neutrophil %: 55.2 %
RBC: 5.09 10*6/uL (ref 3.80–5.20)
RDW: 13.9 % (ref 11.5–14.5)

## 2013-02-24 LAB — LIPASE, BLOOD: Lipase: 75 U/L (ref 73–393)

## 2013-02-25 DIAGNOSIS — R1012 Left upper quadrant pain: Secondary | ICD-10-CM | POA: Diagnosis not present

## 2013-02-25 DIAGNOSIS — K259 Gastric ulcer, unspecified as acute or chronic, without hemorrhage or perforation: Secondary | ICD-10-CM | POA: Diagnosis not present

## 2013-02-25 DIAGNOSIS — R1084 Generalized abdominal pain: Secondary | ICD-10-CM | POA: Diagnosis not present

## 2013-02-25 DIAGNOSIS — K921 Melena: Secondary | ICD-10-CM | POA: Diagnosis not present

## 2013-02-25 DIAGNOSIS — E039 Hypothyroidism, unspecified: Secondary | ICD-10-CM | POA: Diagnosis not present

## 2013-02-25 DIAGNOSIS — Z0389 Encounter for observation for other suspected diseases and conditions ruled out: Secondary | ICD-10-CM | POA: Diagnosis not present

## 2013-02-25 DIAGNOSIS — R1013 Epigastric pain: Secondary | ICD-10-CM | POA: Diagnosis not present

## 2013-02-25 DIAGNOSIS — R1032 Left lower quadrant pain: Secondary | ICD-10-CM | POA: Diagnosis not present

## 2013-02-25 DIAGNOSIS — R52 Pain, unspecified: Secondary | ICD-10-CM | POA: Diagnosis not present

## 2013-02-25 DIAGNOSIS — K625 Hemorrhage of anus and rectum: Secondary | ICD-10-CM | POA: Diagnosis not present

## 2013-02-25 LAB — TROPONIN I
Troponin-I: <0.02 ng/mL
Troponin-I: <0.02 ng/mL
Troponin-I: <0.02 ng/mL

## 2013-02-25 LAB — HEMATOCRIT
HCT: 35.1 %
HCT: 35.8 %

## 2013-02-25 LAB — HEMOGLOBIN: HGB: 12.5 g/dL (ref 12.0–16.0)

## 2013-02-25 LAB — CK TOTAL AND CKMB (NOT AT ARMC)
CK, Total: 69 U/L (ref 21–215)
CK, Total: 68 U/L (ref 21–215)
CK, Total: 76 U/L (ref 21–215)
CK-MB: 0.6 ng/mL (ref 0.5–3.6)

## 2013-02-26 DIAGNOSIS — R1013 Epigastric pain: Secondary | ICD-10-CM | POA: Diagnosis not present

## 2013-02-26 DIAGNOSIS — K259 Gastric ulcer, unspecified as acute or chronic, without hemorrhage or perforation: Secondary | ICD-10-CM | POA: Diagnosis not present

## 2013-02-26 DIAGNOSIS — K625 Hemorrhage of anus and rectum: Secondary | ICD-10-CM | POA: Diagnosis not present

## 2013-02-26 DIAGNOSIS — E039 Hypothyroidism, unspecified: Secondary | ICD-10-CM | POA: Diagnosis not present

## 2013-02-26 DIAGNOSIS — K921 Melena: Secondary | ICD-10-CM | POA: Diagnosis not present

## 2013-02-26 DIAGNOSIS — R1032 Left lower quadrant pain: Secondary | ICD-10-CM | POA: Diagnosis not present

## 2013-02-26 DIAGNOSIS — R1012 Left upper quadrant pain: Secondary | ICD-10-CM | POA: Diagnosis not present

## 2013-02-26 LAB — CBC WITH DIFFERENTIAL/PLATELET
Basophil #: 0 10*3/uL (ref 0.0–0.1)
Basophil %: 0.8 %
HCT: 34.4 % — ABNORMAL LOW (ref 35.0–47.0)
HGB: 11.9 g/dL — ABNORMAL LOW (ref 12.0–16.0)
MCH: 28.7 pg (ref 26.0–34.0)
MCHC: 34.6 g/dL (ref 32.0–36.0)
Monocyte %: 7.7 %
Neutrophil #: 1.5 10*3/uL (ref 1.4–6.5)
Platelet: 162 10*3/uL (ref 150–440)
RDW: 13.8 % (ref 11.5–14.5)
WBC: 3.4 10*3/uL — ABNORMAL LOW (ref 3.6–11.0)

## 2013-02-26 LAB — COMPREHENSIVE METABOLIC PANEL
Alkaline Phosphatase: 87 U/L
Anion Gap: 7 (ref 7–16)
Bilirubin,Total: 0.5 mg/dL (ref 0.2–1.0)
Creatinine: 0.64 mg/dL (ref 0.60–1.30)
EGFR (Non-African Amer.): >60
Osmolality: 280 (ref 275–301)

## 2013-02-26 LAB — MAGNESIUM: Magnesium: 1.6 mg/dL — ABNORMAL LOW

## 2013-02-26 LAB — TSH: Thyroid Stimulating Horm: 2.31 u[IU]/mL

## 2013-02-27 ENCOUNTER — Inpatient Hospital Stay: Payer: Self-pay | Admitting: Internal Medicine

## 2013-02-27 DIAGNOSIS — R5382 Chronic fatigue, unspecified: Secondary | ICD-10-CM | POA: Diagnosis present

## 2013-02-27 DIAGNOSIS — Z79899 Other long term (current) drug therapy: Secondary | ICD-10-CM | POA: Diagnosis not present

## 2013-02-27 DIAGNOSIS — N61 Mastitis without abscess: Secondary | ICD-10-CM | POA: Diagnosis not present

## 2013-02-27 DIAGNOSIS — I498 Other specified cardiac arrhythmias: Secondary | ICD-10-CM | POA: Diagnosis present

## 2013-02-27 DIAGNOSIS — Z803 Family history of malignant neoplasm of breast: Secondary | ICD-10-CM | POA: Diagnosis not present

## 2013-02-27 DIAGNOSIS — K6289 Other specified diseases of anus and rectum: Secondary | ICD-10-CM | POA: Diagnosis present

## 2013-02-27 DIAGNOSIS — F411 Generalized anxiety disorder: Secondary | ICD-10-CM | POA: Diagnosis present

## 2013-02-27 DIAGNOSIS — D72819 Decreased white blood cell count, unspecified: Secondary | ICD-10-CM | POA: Diagnosis not present

## 2013-02-27 DIAGNOSIS — K5289 Other specified noninfective gastroenteritis and colitis: Secondary | ICD-10-CM | POA: Diagnosis present

## 2013-02-27 DIAGNOSIS — R1013 Epigastric pain: Secondary | ICD-10-CM | POA: Diagnosis not present

## 2013-02-27 DIAGNOSIS — E039 Hypothyroidism, unspecified: Secondary | ICD-10-CM | POA: Diagnosis present

## 2013-02-27 DIAGNOSIS — D259 Leiomyoma of uterus, unspecified: Secondary | ICD-10-CM | POA: Diagnosis not present

## 2013-02-27 DIAGNOSIS — G4489 Other headache syndrome: Secondary | ICD-10-CM | POA: Diagnosis present

## 2013-02-27 DIAGNOSIS — I959 Hypotension, unspecified: Secondary | ICD-10-CM | POA: Diagnosis not present

## 2013-02-27 DIAGNOSIS — K921 Melena: Secondary | ICD-10-CM | POA: Diagnosis present

## 2013-02-27 DIAGNOSIS — R141 Gas pain: Secondary | ICD-10-CM | POA: Diagnosis not present

## 2013-02-27 DIAGNOSIS — F450 Somatization disorder: Secondary | ICD-10-CM | POA: Diagnosis present

## 2013-02-27 DIAGNOSIS — G894 Chronic pain syndrome: Secondary | ICD-10-CM | POA: Diagnosis present

## 2013-02-27 DIAGNOSIS — E876 Hypokalemia: Secondary | ICD-10-CM | POA: Diagnosis present

## 2013-02-27 DIAGNOSIS — K2970 Gastritis, unspecified, without bleeding: Secondary | ICD-10-CM | POA: Diagnosis present

## 2013-02-27 DIAGNOSIS — K219 Gastro-esophageal reflux disease without esophagitis: Secondary | ICD-10-CM | POA: Diagnosis present

## 2013-02-27 DIAGNOSIS — K259 Gastric ulcer, unspecified as acute or chronic, without hemorrhage or perforation: Secondary | ICD-10-CM | POA: Diagnosis not present

## 2013-02-27 DIAGNOSIS — R109 Unspecified abdominal pain: Secondary | ICD-10-CM | POA: Diagnosis not present

## 2013-02-27 DIAGNOSIS — F4321 Adjustment disorder with depressed mood: Secondary | ICD-10-CM | POA: Diagnosis not present

## 2013-02-27 DIAGNOSIS — M4120 Other idiopathic scoliosis, site unspecified: Secondary | ICD-10-CM | POA: Diagnosis present

## 2013-02-27 DIAGNOSIS — Z800 Family history of malignant neoplasm of digestive organs: Secondary | ICD-10-CM | POA: Diagnosis not present

## 2013-02-27 DIAGNOSIS — N281 Cyst of kidney, acquired: Secondary | ICD-10-CM | POA: Diagnosis not present

## 2013-02-27 DIAGNOSIS — K269 Duodenal ulcer, unspecified as acute or chronic, without hemorrhage or perforation: Secondary | ICD-10-CM | POA: Diagnosis present

## 2013-02-27 DIAGNOSIS — R5381 Other malaise: Secondary | ICD-10-CM | POA: Diagnosis not present

## 2013-02-27 LAB — CBC WITH DIFFERENTIAL/PLATELET
Eosinophil %: 3.6 %
HGB: 12.3 g/dL (ref 12.0–16.0)
MCHC: 34.5 g/dL (ref 32.0–36.0)
MCV: 84 fL (ref 80–100)
Monocyte #: 0.3 x10 3/mm (ref 0.2–0.9)
Monocyte %: 8.8 %
Neutrophil #: 1.3 10*3/uL — ABNORMAL LOW (ref 1.4–6.5)
Neutrophil %: 41.3 %
WBC: 3.2 10*3/uL — ABNORMAL LOW (ref 3.6–11.0)

## 2013-02-27 LAB — OCCULT BLOOD X 1 CARD TO LAB, STOOL: Occult Blood, Feces: NEGATIVE

## 2013-02-27 LAB — CLOSTRIDIUM DIFFICILE(ARMC)

## 2013-02-28 LAB — CBC WITH DIFFERENTIAL/PLATELET
Basophil #: 0 10*3/uL (ref 0.0–0.1)
Eosinophil #: 0.1 10*3/uL (ref 0.0–0.7)
HGB: 13 g/dL (ref 12.0–16.0)
Lymphocyte #: 1.2 10*3/uL (ref 1.0–3.6)
Lymphocyte %: 35.6 %
MCV: 83 fL (ref 80–100)
Neutrophil #: 1.7 10*3/uL (ref 1.4–6.5)
RBC: 4.46 10*6/uL (ref 3.80–5.20)
WBC: 3.4 10*3/uL — ABNORMAL LOW (ref 3.6–11.0)

## 2013-02-28 LAB — BASIC METABOLIC PANEL
Chloride: 111 mmol/L — ABNORMAL HIGH (ref 98–107)
EGFR (Non-African Amer.): >60
Glucose: 113 mg/dL — ABNORMAL HIGH (ref 65–99)
Osmolality: 280 (ref 275–301)
Potassium: 3.6 mmol/L (ref 3.5–5.1)
Sodium: 141 mmol/L (ref 136–145)

## 2013-03-01 LAB — PATHOLOGY REPORT

## 2013-03-02 LAB — CBC WITH DIFFERENTIAL/PLATELET
Basophil %: 1.2 %
Eosinophil %: 1.7 %
HCT: 35.5 % (ref 35.0–47.0)
HGB: 12 g/dL (ref 12.0–16.0)
Lymphocyte %: 45.2 %
MCH: 28.8 pg (ref 26.0–34.0)
MCHC: 33.7 g/dL (ref 32.0–36.0)
MCV: 85 fL (ref 80–100)
Monocyte #: 0.2 x10 3/mm (ref 0.2–0.9)
Neutrophil %: 45.4 %
Platelet: 157 10*3/uL (ref 150–440)
RBC: 4.16 10*6/uL (ref 3.80–5.20)
RDW: 13.6 % (ref 11.5–14.5)
WBC: 3.8 10*3/uL (ref 3.6–11.0)

## 2013-03-02 LAB — BASIC METABOLIC PANEL
Anion Gap: 9 (ref 7–16)
BUN: 4 mg/dL — ABNORMAL LOW (ref 7–18)
Calcium, Total: 8.1 mg/dL — ABNORMAL LOW (ref 8.5–10.1)
Chloride: 114 mmol/L — ABNORMAL HIGH (ref 98–107)
Co2: 21 mmol/L (ref 21–32)
Creatinine: 0.73 mg/dL (ref 0.60–1.30)
EGFR (African American): >60
EGFR (Non-African Amer.): >60
Glucose: 114 mg/dL — ABNORMAL HIGH (ref 65–99)
Sodium: 144 mmol/L (ref 136–145)

## 2013-03-07 LAB — STOOL CULTURE

## 2013-03-09 ENCOUNTER — Ambulatory Visit: Payer: Self-pay | Admitting: Obstetrics and Gynecology

## 2013-03-09 DIAGNOSIS — N644 Mastodynia: Secondary | ICD-10-CM | POA: Diagnosis not present

## 2013-03-09 DIAGNOSIS — N6009 Solitary cyst of unspecified breast: Secondary | ICD-10-CM | POA: Diagnosis not present

## 2013-03-10 DIAGNOSIS — N926 Irregular menstruation, unspecified: Secondary | ICD-10-CM | POA: Diagnosis not present

## 2013-03-10 DIAGNOSIS — F330 Major depressive disorder, recurrent, mild: Secondary | ICD-10-CM | POA: Diagnosis not present

## 2013-03-10 DIAGNOSIS — K279 Peptic ulcer, site unspecified, unspecified as acute or chronic, without hemorrhage or perforation: Secondary | ICD-10-CM | POA: Diagnosis not present

## 2013-03-10 DIAGNOSIS — N281 Cyst of kidney, acquired: Secondary | ICD-10-CM | POA: Diagnosis not present

## 2013-03-10 DIAGNOSIS — D259 Leiomyoma of uterus, unspecified: Secondary | ICD-10-CM | POA: Diagnosis not present

## 2013-03-10 DIAGNOSIS — K2970 Gastritis, unspecified, without bleeding: Secondary | ICD-10-CM | POA: Diagnosis not present

## 2013-03-10 DIAGNOSIS — R5382 Chronic fatigue, unspecified: Secondary | ICD-10-CM | POA: Diagnosis not present

## 2013-03-14 DIAGNOSIS — R947 Abnormal results of other endocrine function studies: Secondary | ICD-10-CM | POA: Diagnosis not present

## 2013-03-14 DIAGNOSIS — R5381 Other malaise: Secondary | ICD-10-CM | POA: Diagnosis not present

## 2013-03-28 DIAGNOSIS — N949 Unspecified condition associated with female genital organs and menstrual cycle: Secondary | ICD-10-CM | POA: Diagnosis not present

## 2013-03-28 DIAGNOSIS — N390 Urinary tract infection, site not specified: Secondary | ICD-10-CM | POA: Diagnosis not present

## 2013-03-28 DIAGNOSIS — R112 Nausea with vomiting, unspecified: Secondary | ICD-10-CM | POA: Diagnosis not present

## 2013-03-28 DIAGNOSIS — K259 Gastric ulcer, unspecified as acute or chronic, without hemorrhage or perforation: Secondary | ICD-10-CM | POA: Diagnosis not present

## 2013-04-05 DIAGNOSIS — N949 Unspecified condition associated with female genital organs and menstrual cycle: Secondary | ICD-10-CM | POA: Diagnosis not present

## 2013-04-05 DIAGNOSIS — D259 Leiomyoma of uterus, unspecified: Secondary | ICD-10-CM | POA: Diagnosis not present

## 2013-04-06 HISTORY — PX: LAPAROSCOPIC ENDOMETRIOSIS FULGURATION: SUR769

## 2013-04-13 DIAGNOSIS — D259 Leiomyoma of uterus, unspecified: Secondary | ICD-10-CM | POA: Diagnosis not present

## 2013-04-13 DIAGNOSIS — N949 Unspecified condition associated with female genital organs and menstrual cycle: Secondary | ICD-10-CM | POA: Diagnosis not present

## 2013-04-19 ENCOUNTER — Ambulatory Visit: Payer: Self-pay | Admitting: Obstetrics and Gynecology

## 2013-04-19 DIAGNOSIS — N946 Dysmenorrhea, unspecified: Secondary | ICD-10-CM | POA: Diagnosis not present

## 2013-04-19 DIAGNOSIS — E039 Hypothyroidism, unspecified: Secondary | ICD-10-CM | POA: Diagnosis not present

## 2013-04-19 DIAGNOSIS — N949 Unspecified condition associated with female genital organs and menstrual cycle: Secondary | ICD-10-CM | POA: Diagnosis not present

## 2013-04-19 DIAGNOSIS — K219 Gastro-esophageal reflux disease without esophagitis: Secondary | ICD-10-CM | POA: Diagnosis not present

## 2013-04-19 DIAGNOSIS — N644 Mastodynia: Secondary | ICD-10-CM | POA: Diagnosis not present

## 2013-04-19 DIAGNOSIS — Z8742 Personal history of other diseases of the female genital tract: Secondary | ICD-10-CM | POA: Diagnosis not present

## 2013-04-19 DIAGNOSIS — B009 Herpesviral infection, unspecified: Secondary | ICD-10-CM | POA: Diagnosis not present

## 2013-04-19 DIAGNOSIS — Z9889 Other specified postprocedural states: Secondary | ICD-10-CM | POA: Diagnosis not present

## 2013-04-19 DIAGNOSIS — D259 Leiomyoma of uterus, unspecified: Secondary | ICD-10-CM | POA: Diagnosis not present

## 2013-04-19 DIAGNOSIS — Z79899 Other long term (current) drug therapy: Secondary | ICD-10-CM | POA: Diagnosis not present

## 2013-04-19 DIAGNOSIS — N9489 Other specified conditions associated with female genital organs and menstrual cycle: Secondary | ICD-10-CM | POA: Diagnosis not present

## 2013-04-19 DIAGNOSIS — M4120 Other idiopathic scoliosis, site unspecified: Secondary | ICD-10-CM | POA: Diagnosis not present

## 2013-04-19 DIAGNOSIS — Z01812 Encounter for preprocedural laboratory examination: Secondary | ICD-10-CM | POA: Diagnosis not present

## 2013-04-19 DIAGNOSIS — G9332 Myalgic encephalomyelitis/chronic fatigue syndrome: Secondary | ICD-10-CM | POA: Diagnosis not present

## 2013-04-19 DIAGNOSIS — R5382 Chronic fatigue, unspecified: Secondary | ICD-10-CM | POA: Diagnosis not present

## 2013-04-19 LAB — CBC
HCT: 39.4 % (ref 35.0–47.0)
HGB: 13.6 g/dL (ref 12.0–16.0)
MCH: 28.7 pg (ref 26.0–34.0)
MCHC: 34.5 g/dL (ref 32.0–36.0)
MCV: 83 fL (ref 80–100)
PLATELETS: 193 10*3/uL (ref 150–440)
RBC: 4.74 10*6/uL (ref 3.80–5.20)
RDW: 13.7 % (ref 11.5–14.5)
WBC: 5.1 10*3/uL (ref 3.6–11.0)

## 2013-04-19 LAB — PREGNANCY, URINE: PREGNANCY TEST, URINE: NEGATIVE m[IU]/mL

## 2013-04-24 ENCOUNTER — Ambulatory Visit: Payer: Self-pay | Admitting: Obstetrics and Gynecology

## 2013-04-24 DIAGNOSIS — Z833 Family history of diabetes mellitus: Secondary | ICD-10-CM | POA: Diagnosis not present

## 2013-04-24 DIAGNOSIS — M6020 Foreign body granuloma of soft tissue, not elsewhere classified, unspecified site: Secondary | ICD-10-CM | POA: Diagnosis not present

## 2013-04-24 DIAGNOSIS — Z888 Allergy status to other drugs, medicaments and biological substances status: Secondary | ICD-10-CM | POA: Diagnosis not present

## 2013-04-24 DIAGNOSIS — E079 Disorder of thyroid, unspecified: Secondary | ICD-10-CM | POA: Diagnosis not present

## 2013-04-24 DIAGNOSIS — Z800 Family history of malignant neoplasm of digestive organs: Secondary | ICD-10-CM | POA: Diagnosis not present

## 2013-04-24 DIAGNOSIS — N803 Endometriosis of pelvic peritoneum, unspecified: Secondary | ICD-10-CM | POA: Diagnosis not present

## 2013-04-24 DIAGNOSIS — Z885 Allergy status to narcotic agent status: Secondary | ICD-10-CM | POA: Diagnosis not present

## 2013-04-24 DIAGNOSIS — N83209 Unspecified ovarian cyst, unspecified side: Secondary | ICD-10-CM | POA: Diagnosis not present

## 2013-04-24 DIAGNOSIS — M4120 Other idiopathic scoliosis, site unspecified: Secondary | ICD-10-CM | POA: Diagnosis not present

## 2013-04-24 DIAGNOSIS — N80109 Endometriosis of ovary, unspecified side, unspecified depth: Secondary | ICD-10-CM | POA: Diagnosis not present

## 2013-04-24 DIAGNOSIS — Z801 Family history of malignant neoplasm of trachea, bronchus and lung: Secondary | ICD-10-CM | POA: Diagnosis not present

## 2013-04-24 DIAGNOSIS — N949 Unspecified condition associated with female genital organs and menstrual cycle: Secondary | ICD-10-CM | POA: Diagnosis not present

## 2013-04-24 DIAGNOSIS — G8929 Other chronic pain: Secondary | ICD-10-CM | POA: Diagnosis not present

## 2013-04-24 DIAGNOSIS — N739 Female pelvic inflammatory disease, unspecified: Secondary | ICD-10-CM | POA: Diagnosis not present

## 2013-04-24 DIAGNOSIS — N809 Endometriosis, unspecified: Secondary | ICD-10-CM | POA: Diagnosis not present

## 2013-04-24 DIAGNOSIS — Z8489 Family history of other specified conditions: Secondary | ICD-10-CM | POA: Diagnosis not present

## 2013-04-24 DIAGNOSIS — Z79899 Other long term (current) drug therapy: Secondary | ICD-10-CM | POA: Diagnosis not present

## 2013-04-24 DIAGNOSIS — N736 Female pelvic peritoneal adhesions (postinfective): Secondary | ICD-10-CM | POA: Diagnosis not present

## 2013-04-24 DIAGNOSIS — Z803 Family history of malignant neoplasm of breast: Secondary | ICD-10-CM | POA: Diagnosis not present

## 2013-04-24 DIAGNOSIS — D259 Leiomyoma of uterus, unspecified: Secondary | ICD-10-CM | POA: Diagnosis not present

## 2013-04-27 LAB — PATHOLOGY REPORT

## 2013-05-04 DIAGNOSIS — N809 Endometriosis, unspecified: Secondary | ICD-10-CM | POA: Diagnosis not present

## 2013-06-12 DIAGNOSIS — R110 Nausea: Secondary | ICD-10-CM | POA: Diagnosis not present

## 2013-06-12 DIAGNOSIS — G4700 Insomnia, unspecified: Secondary | ICD-10-CM | POA: Diagnosis not present

## 2013-06-12 DIAGNOSIS — G479 Sleep disorder, unspecified: Secondary | ICD-10-CM | POA: Diagnosis not present

## 2013-06-12 DIAGNOSIS — K2990 Gastroduodenitis, unspecified, without bleeding: Secondary | ICD-10-CM | POA: Diagnosis not present

## 2013-06-12 DIAGNOSIS — K2970 Gastritis, unspecified, without bleeding: Secondary | ICD-10-CM | POA: Diagnosis not present

## 2013-06-12 DIAGNOSIS — M25519 Pain in unspecified shoulder: Secondary | ICD-10-CM | POA: Diagnosis not present

## 2013-07-05 DIAGNOSIS — R5381 Other malaise: Secondary | ICD-10-CM | POA: Diagnosis not present

## 2013-07-05 DIAGNOSIS — E041 Nontoxic single thyroid nodule: Secondary | ICD-10-CM | POA: Diagnosis not present

## 2013-07-05 DIAGNOSIS — R1310 Dysphagia, unspecified: Secondary | ICD-10-CM | POA: Diagnosis not present

## 2013-07-13 DIAGNOSIS — R609 Edema, unspecified: Secondary | ICD-10-CM | POA: Diagnosis not present

## 2013-07-13 DIAGNOSIS — R390 Extravasation of urine: Secondary | ICD-10-CM | POA: Diagnosis not present

## 2013-07-13 DIAGNOSIS — R110 Nausea: Secondary | ICD-10-CM | POA: Diagnosis not present

## 2013-07-13 DIAGNOSIS — R109 Unspecified abdominal pain: Secondary | ICD-10-CM | POA: Diagnosis not present

## 2013-07-13 DIAGNOSIS — R34 Anuria and oliguria: Secondary | ICD-10-CM | POA: Diagnosis not present

## 2013-07-14 ENCOUNTER — Emergency Department: Payer: Self-pay | Admitting: Emergency Medicine

## 2013-07-14 DIAGNOSIS — D259 Leiomyoma of uterus, unspecified: Secondary | ICD-10-CM | POA: Diagnosis not present

## 2013-07-14 DIAGNOSIS — E039 Hypothyroidism, unspecified: Secondary | ICD-10-CM | POA: Diagnosis not present

## 2013-07-14 DIAGNOSIS — Z9889 Other specified postprocedural states: Secondary | ICD-10-CM | POA: Diagnosis not present

## 2013-07-14 DIAGNOSIS — N72 Inflammatory disease of cervix uteri: Secondary | ICD-10-CM | POA: Diagnosis not present

## 2013-07-14 DIAGNOSIS — N12 Tubulo-interstitial nephritis, not specified as acute or chronic: Secondary | ICD-10-CM | POA: Diagnosis not present

## 2013-07-14 DIAGNOSIS — N10 Acute tubulo-interstitial nephritis: Secondary | ICD-10-CM | POA: Diagnosis not present

## 2013-07-14 LAB — CBC WITH DIFFERENTIAL/PLATELET
Basophil #: 0 10*3/uL (ref 0.0–0.1)
Basophil %: 0.6 %
EOS ABS: 0.1 10*3/uL (ref 0.0–0.7)
EOS PCT: 2 %
HCT: 46.2 % (ref 35.0–47.0)
HGB: 15 g/dL (ref 12.0–16.0)
Lymphocyte #: 1.9 10*3/uL (ref 1.0–3.6)
Lymphocyte %: 38.5 %
MCH: 27.9 pg (ref 26.0–34.0)
MCHC: 32.5 g/dL (ref 32.0–36.0)
MCV: 86 fL (ref 80–100)
Monocyte #: 0.3 x10 3/mm (ref 0.2–0.9)
Monocyte %: 6.9 %
NEUTROS ABS: 2.6 10*3/uL (ref 1.4–6.5)
Neutrophil %: 52 %
Platelet: 215 10*3/uL (ref 150–440)
RBC: 5.39 10*6/uL — AB (ref 3.80–5.20)
RDW: 14 % (ref 11.5–14.5)
WBC: 5 10*3/uL (ref 3.6–11.0)

## 2013-07-14 LAB — COMPREHENSIVE METABOLIC PANEL
ALBUMIN: 3.5 g/dL (ref 3.4–5.0)
ALT: 55 U/L (ref 12–78)
ANION GAP: 3 — AB (ref 7–16)
AST: 46 U/L — AB (ref 15–37)
Alkaline Phosphatase: 161 U/L — ABNORMAL HIGH
BUN: 6 mg/dL — ABNORMAL LOW (ref 7–18)
Bilirubin,Total: 0.6 mg/dL (ref 0.2–1.0)
CALCIUM: 8.8 mg/dL (ref 8.5–10.1)
CREATININE: 0.66 mg/dL (ref 0.60–1.30)
Chloride: 107 mmol/L (ref 98–107)
Co2: 27 mmol/L (ref 21–32)
EGFR (African American): >60
GLUCOSE: 90 mg/dL (ref 65–99)
OSMOLALITY: 271 (ref 275–301)
Potassium: 4 mmol/L (ref 3.5–5.1)
Sodium: 137 mmol/L (ref 136–145)
Total Protein: 7.1 g/dL (ref 6.4–8.2)

## 2013-07-14 LAB — URINALYSIS, COMPLETE
Bacteria: NONE SEEN
Bilirubin,UR: NEGATIVE
Blood: NEGATIVE
Glucose,UR: NEGATIVE mg/dL
Ketone: NEGATIVE
Leukocyte Esterase: NEGATIVE
Nitrite: POSITIVE
Ph: 8
Protein: NEGATIVE
RBC,UR: 2 /HPF
Specific Gravity: 1.017
Squamous Epithelial: 12
WBC UR: 12 /HPF

## 2013-07-14 LAB — GC/CHLAMYDIA PROBE AMP

## 2013-07-14 LAB — WET PREP, GENITAL

## 2013-07-24 DIAGNOSIS — D259 Leiomyoma of uterus, unspecified: Secondary | ICD-10-CM | POA: Diagnosis not present

## 2013-07-24 DIAGNOSIS — N809 Endometriosis, unspecified: Secondary | ICD-10-CM | POA: Diagnosis not present

## 2013-07-24 DIAGNOSIS — N949 Unspecified condition associated with female genital organs and menstrual cycle: Secondary | ICD-10-CM | POA: Diagnosis not present

## 2013-07-24 DIAGNOSIS — R569 Unspecified convulsions: Secondary | ICD-10-CM | POA: Diagnosis not present

## 2013-07-24 DIAGNOSIS — Z9189 Other specified personal risk factors, not elsewhere classified: Secondary | ICD-10-CM | POA: Diagnosis not present

## 2013-07-24 DIAGNOSIS — K259 Gastric ulcer, unspecified as acute or chronic, without hemorrhage or perforation: Secondary | ICD-10-CM | POA: Diagnosis not present

## 2013-07-25 DIAGNOSIS — D259 Leiomyoma of uterus, unspecified: Secondary | ICD-10-CM | POA: Diagnosis not present

## 2013-07-25 DIAGNOSIS — R34 Anuria and oliguria: Secondary | ICD-10-CM | POA: Diagnosis not present

## 2013-07-25 DIAGNOSIS — R109 Unspecified abdominal pain: Secondary | ICD-10-CM | POA: Diagnosis not present

## 2013-07-26 DIAGNOSIS — K6289 Other specified diseases of anus and rectum: Secondary | ICD-10-CM | POA: Diagnosis not present

## 2013-07-26 DIAGNOSIS — R198 Other specified symptoms and signs involving the digestive system and abdomen: Secondary | ICD-10-CM | POA: Diagnosis not present

## 2013-07-26 DIAGNOSIS — K625 Hemorrhage of anus and rectum: Secondary | ICD-10-CM | POA: Diagnosis not present

## 2013-07-26 DIAGNOSIS — R5381 Other malaise: Secondary | ICD-10-CM | POA: Diagnosis not present

## 2013-07-26 DIAGNOSIS — R5383 Other fatigue: Secondary | ICD-10-CM | POA: Diagnosis not present

## 2013-08-10 DIAGNOSIS — N8000 Endometriosis of the uterus, unspecified: Secondary | ICD-10-CM | POA: Diagnosis not present

## 2013-08-10 DIAGNOSIS — G9332 Myalgic encephalomyelitis/chronic fatigue syndrome: Secondary | ICD-10-CM | POA: Diagnosis not present

## 2013-08-10 DIAGNOSIS — F329 Major depressive disorder, single episode, unspecified: Secondary | ICD-10-CM | POA: Diagnosis not present

## 2013-08-10 DIAGNOSIS — D259 Leiomyoma of uterus, unspecified: Secondary | ICD-10-CM | POA: Diagnosis not present

## 2013-08-10 DIAGNOSIS — R5382 Chronic fatigue, unspecified: Secondary | ICD-10-CM | POA: Diagnosis not present

## 2013-08-16 DIAGNOSIS — N938 Other specified abnormal uterine and vaginal bleeding: Secondary | ICD-10-CM | POA: Diagnosis not present

## 2013-08-16 DIAGNOSIS — F609 Personality disorder, unspecified: Secondary | ICD-10-CM | POA: Diagnosis not present

## 2013-08-16 DIAGNOSIS — N949 Unspecified condition associated with female genital organs and menstrual cycle: Secondary | ICD-10-CM | POA: Diagnosis not present

## 2013-08-16 DIAGNOSIS — N925 Other specified irregular menstruation: Secondary | ICD-10-CM | POA: Diagnosis not present

## 2013-08-18 DIAGNOSIS — N949 Unspecified condition associated with female genital organs and menstrual cycle: Secondary | ICD-10-CM | POA: Diagnosis not present

## 2013-08-18 DIAGNOSIS — N925 Other specified irregular menstruation: Secondary | ICD-10-CM | POA: Diagnosis not present

## 2013-08-24 DIAGNOSIS — N809 Endometriosis, unspecified: Secondary | ICD-10-CM | POA: Diagnosis not present

## 2013-08-24 DIAGNOSIS — N8000 Endometriosis of the uterus, unspecified: Secondary | ICD-10-CM | POA: Diagnosis not present

## 2013-08-24 DIAGNOSIS — D259 Leiomyoma of uterus, unspecified: Secondary | ICD-10-CM | POA: Diagnosis not present

## 2013-08-30 DIAGNOSIS — R1084 Generalized abdominal pain: Secondary | ICD-10-CM | POA: Diagnosis not present

## 2013-08-30 DIAGNOSIS — K625 Hemorrhage of anus and rectum: Secondary | ICD-10-CM | POA: Diagnosis not present

## 2013-08-30 DIAGNOSIS — K5289 Other specified noninfective gastroenteritis and colitis: Secondary | ICD-10-CM | POA: Diagnosis not present

## 2013-08-30 DIAGNOSIS — N809 Endometriosis, unspecified: Secondary | ICD-10-CM | POA: Insufficient documentation

## 2013-08-30 DIAGNOSIS — K6289 Other specified diseases of anus and rectum: Secondary | ICD-10-CM | POA: Insufficient documentation

## 2013-09-04 DIAGNOSIS — Z23 Encounter for immunization: Secondary | ICD-10-CM | POA: Diagnosis not present

## 2013-09-04 DIAGNOSIS — E559 Vitamin D deficiency, unspecified: Secondary | ICD-10-CM | POA: Diagnosis not present

## 2013-09-04 DIAGNOSIS — N809 Endometriosis, unspecified: Secondary | ICD-10-CM | POA: Diagnosis not present

## 2013-09-04 DIAGNOSIS — F3289 Other specified depressive episodes: Secondary | ICD-10-CM | POA: Diagnosis not present

## 2013-09-04 DIAGNOSIS — F329 Major depressive disorder, single episode, unspecified: Secondary | ICD-10-CM | POA: Diagnosis not present

## 2013-09-13 DIAGNOSIS — R1084 Generalized abdominal pain: Secondary | ICD-10-CM | POA: Diagnosis not present

## 2013-09-13 DIAGNOSIS — K6289 Other specified diseases of anus and rectum: Secondary | ICD-10-CM | POA: Diagnosis not present

## 2013-10-02 DIAGNOSIS — M79609 Pain in unspecified limb: Secondary | ICD-10-CM | POA: Diagnosis not present

## 2013-10-02 DIAGNOSIS — B351 Tinea unguium: Secondary | ICD-10-CM | POA: Diagnosis not present

## 2013-10-19 DIAGNOSIS — N921 Excessive and frequent menstruation with irregular cycle: Secondary | ICD-10-CM | POA: Diagnosis not present

## 2013-10-19 DIAGNOSIS — N809 Endometriosis, unspecified: Secondary | ICD-10-CM | POA: Diagnosis not present

## 2013-10-23 DIAGNOSIS — D518 Other vitamin B12 deficiency anemias: Secondary | ICD-10-CM | POA: Diagnosis not present

## 2013-10-23 DIAGNOSIS — N809 Endometriosis, unspecified: Secondary | ICD-10-CM | POA: Diagnosis not present

## 2013-10-23 DIAGNOSIS — R5383 Other fatigue: Secondary | ICD-10-CM | POA: Diagnosis not present

## 2013-10-23 DIAGNOSIS — E038 Other specified hypothyroidism: Secondary | ICD-10-CM | POA: Diagnosis not present

## 2013-10-23 DIAGNOSIS — G4700 Insomnia, unspecified: Secondary | ICD-10-CM | POA: Diagnosis not present

## 2013-10-23 DIAGNOSIS — R5381 Other malaise: Secondary | ICD-10-CM | POA: Diagnosis not present

## 2013-10-23 DIAGNOSIS — E039 Hypothyroidism, unspecified: Secondary | ICD-10-CM | POA: Diagnosis not present

## 2013-10-23 DIAGNOSIS — E559 Vitamin D deficiency, unspecified: Secondary | ICD-10-CM | POA: Diagnosis not present

## 2013-11-03 DIAGNOSIS — F4310 Post-traumatic stress disorder, unspecified: Secondary | ICD-10-CM | POA: Diagnosis not present

## 2013-11-03 DIAGNOSIS — F321 Major depressive disorder, single episode, moderate: Secondary | ICD-10-CM | POA: Diagnosis not present

## 2013-11-13 DIAGNOSIS — R109 Unspecified abdominal pain: Secondary | ICD-10-CM | POA: Diagnosis not present

## 2013-11-13 DIAGNOSIS — D259 Leiomyoma of uterus, unspecified: Secondary | ICD-10-CM | POA: Diagnosis not present

## 2013-11-13 DIAGNOSIS — N390 Urinary tract infection, site not specified: Secondary | ICD-10-CM | POA: Diagnosis not present

## 2013-11-13 DIAGNOSIS — R5381 Other malaise: Secondary | ICD-10-CM | POA: Diagnosis not present

## 2013-11-13 DIAGNOSIS — N83209 Unspecified ovarian cyst, unspecified side: Secondary | ICD-10-CM | POA: Diagnosis not present

## 2013-11-14 ENCOUNTER — Ambulatory Visit: Payer: Self-pay | Admitting: Obstetrics and Gynecology

## 2013-11-14 DIAGNOSIS — N949 Unspecified condition associated with female genital organs and menstrual cycle: Secondary | ICD-10-CM | POA: Diagnosis not present

## 2013-11-14 DIAGNOSIS — Z01812 Encounter for preprocedural laboratory examination: Secondary | ICD-10-CM | POA: Diagnosis not present

## 2013-11-14 LAB — BASIC METABOLIC PANEL
Anion Gap: 8 (ref 7–16)
BUN: 7 mg/dL (ref 7–18)
CREATININE: 0.65 mg/dL (ref 0.60–1.30)
Calcium, Total: 8.8 mg/dL (ref 8.5–10.1)
Chloride: 106 mmol/L (ref 98–107)
Co2: 28 mmol/L (ref 21–32)
EGFR (African American): >60
Glucose: 82 mg/dL (ref 65–99)
Osmolality: 280 (ref 275–301)
POTASSIUM: 4.1 mmol/L (ref 3.5–5.1)
SODIUM: 142 mmol/L (ref 136–145)

## 2013-11-14 LAB — CBC
HCT: 39.6 % (ref 35.0–47.0)
HGB: 13.1 g/dL (ref 12.0–16.0)
MCH: 28.5 pg (ref 26.0–34.0)
MCHC: 33.1 g/dL (ref 32.0–36.0)
MCV: 86 fL (ref 80–100)
Platelet: 209 10*3/uL (ref 150–440)
RBC: 4.59 10*6/uL (ref 3.80–5.20)
RDW: 13.4 % (ref 11.5–14.5)
WBC: 5.5 10*3/uL (ref 3.6–11.0)

## 2013-11-17 DIAGNOSIS — F4310 Post-traumatic stress disorder, unspecified: Secondary | ICD-10-CM | POA: Diagnosis not present

## 2013-11-17 DIAGNOSIS — F321 Major depressive disorder, single episode, moderate: Secondary | ICD-10-CM | POA: Diagnosis not present

## 2013-11-23 ENCOUNTER — Ambulatory Visit: Payer: Self-pay | Admitting: Obstetrics and Gynecology

## 2013-11-23 DIAGNOSIS — R42 Dizziness and giddiness: Secondary | ICD-10-CM | POA: Diagnosis not present

## 2013-11-23 DIAGNOSIS — Z9109 Other allergy status, other than to drugs and biological substances: Secondary | ICD-10-CM | POA: Diagnosis not present

## 2013-11-23 DIAGNOSIS — Z800 Family history of malignant neoplasm of digestive organs: Secondary | ICD-10-CM | POA: Diagnosis not present

## 2013-11-23 DIAGNOSIS — R413 Other amnesia: Secondary | ICD-10-CM | POA: Diagnosis not present

## 2013-11-23 DIAGNOSIS — G43909 Migraine, unspecified, not intractable, without status migrainosus: Secondary | ICD-10-CM | POA: Diagnosis not present

## 2013-11-23 DIAGNOSIS — N803 Endometriosis of pelvic peritoneum, unspecified: Secondary | ICD-10-CM | POA: Diagnosis not present

## 2013-11-23 DIAGNOSIS — M545 Low back pain, unspecified: Secondary | ICD-10-CM | POA: Diagnosis not present

## 2013-11-23 DIAGNOSIS — N949 Unspecified condition associated with female genital organs and menstrual cycle: Secondary | ICD-10-CM | POA: Diagnosis not present

## 2013-11-23 DIAGNOSIS — Z8489 Family history of other specified conditions: Secondary | ICD-10-CM | POA: Diagnosis not present

## 2013-11-23 DIAGNOSIS — Z8249 Family history of ischemic heart disease and other diseases of the circulatory system: Secondary | ICD-10-CM | POA: Diagnosis not present

## 2013-11-23 DIAGNOSIS — R1084 Generalized abdominal pain: Secondary | ICD-10-CM | POA: Diagnosis not present

## 2013-11-23 DIAGNOSIS — Z888 Allergy status to other drugs, medicaments and biological substances status: Secondary | ICD-10-CM | POA: Diagnosis not present

## 2013-11-23 DIAGNOSIS — Z807 Family history of other malignant neoplasms of lymphoid, hematopoietic and related tissues: Secondary | ICD-10-CM | POA: Diagnosis not present

## 2013-11-23 DIAGNOSIS — K219 Gastro-esophageal reflux disease without esophagitis: Secondary | ICD-10-CM | POA: Diagnosis not present

## 2013-11-23 DIAGNOSIS — M4120 Other idiopathic scoliosis, site unspecified: Secondary | ICD-10-CM | POA: Diagnosis not present

## 2013-11-23 DIAGNOSIS — Z8711 Personal history of peptic ulcer disease: Secondary | ICD-10-CM | POA: Diagnosis not present

## 2013-11-23 DIAGNOSIS — N808 Other endometriosis: Secondary | ICD-10-CM | POA: Diagnosis not present

## 2013-11-23 DIAGNOSIS — E079 Disorder of thyroid, unspecified: Secondary | ICD-10-CM | POA: Diagnosis not present

## 2013-11-23 DIAGNOSIS — Z79899 Other long term (current) drug therapy: Secondary | ICD-10-CM | POA: Diagnosis not present

## 2013-11-23 DIAGNOSIS — Z803 Family history of malignant neoplasm of breast: Secondary | ICD-10-CM | POA: Diagnosis not present

## 2013-11-23 DIAGNOSIS — G9332 Myalgic encephalomyelitis/chronic fatigue syndrome: Secondary | ICD-10-CM | POA: Diagnosis not present

## 2013-11-23 DIAGNOSIS — Z833 Family history of diabetes mellitus: Secondary | ICD-10-CM | POA: Diagnosis not present

## 2013-11-23 DIAGNOSIS — Z91010 Allergy to peanuts: Secondary | ICD-10-CM | POA: Diagnosis not present

## 2013-11-23 DIAGNOSIS — Z885 Allergy status to narcotic agent status: Secondary | ICD-10-CM | POA: Diagnosis not present

## 2013-11-23 DIAGNOSIS — Z801 Family history of malignant neoplasm of trachea, bronchus and lung: Secondary | ICD-10-CM | POA: Diagnosis not present

## 2013-11-29 LAB — PATHOLOGY REPORT

## 2013-11-30 ENCOUNTER — Emergency Department: Payer: Self-pay | Admitting: Emergency Medicine

## 2013-11-30 DIAGNOSIS — R509 Fever, unspecified: Secondary | ICD-10-CM | POA: Diagnosis not present

## 2013-11-30 DIAGNOSIS — K5900 Constipation, unspecified: Secondary | ICD-10-CM | POA: Diagnosis not present

## 2013-11-30 DIAGNOSIS — N898 Other specified noninflammatory disorders of vagina: Secondary | ICD-10-CM | POA: Diagnosis not present

## 2013-11-30 DIAGNOSIS — R630 Anorexia: Secondary | ICD-10-CM | POA: Diagnosis not present

## 2013-11-30 DIAGNOSIS — R319 Hematuria, unspecified: Secondary | ICD-10-CM | POA: Diagnosis not present

## 2013-11-30 DIAGNOSIS — R52 Pain, unspecified: Secondary | ICD-10-CM | POA: Diagnosis not present

## 2013-11-30 DIAGNOSIS — Z79899 Other long term (current) drug therapy: Secondary | ICD-10-CM | POA: Diagnosis not present

## 2013-11-30 DIAGNOSIS — R109 Unspecified abdominal pain: Secondary | ICD-10-CM | POA: Diagnosis not present

## 2013-11-30 DIAGNOSIS — D259 Leiomyoma of uterus, unspecified: Secondary | ICD-10-CM | POA: Diagnosis not present

## 2013-11-30 DIAGNOSIS — R110 Nausea: Secondary | ICD-10-CM | POA: Diagnosis not present

## 2013-11-30 DIAGNOSIS — R300 Dysuria: Secondary | ICD-10-CM | POA: Diagnosis not present

## 2013-11-30 DIAGNOSIS — Z3202 Encounter for pregnancy test, result negative: Secondary | ICD-10-CM | POA: Diagnosis not present

## 2013-11-30 LAB — BASIC METABOLIC PANEL
Anion Gap: 8 (ref 7–16)
BUN: 9 mg/dL (ref 7–18)
CALCIUM: 8.4 mg/dL — AB (ref 8.5–10.1)
CREATININE: 0.72 mg/dL (ref 0.60–1.30)
Chloride: 106 mmol/L (ref 98–107)
Co2: 25 mmol/L (ref 21–32)
Glucose: 121 mg/dL — ABNORMAL HIGH (ref 65–99)
OSMOLALITY: 277 (ref 275–301)
POTASSIUM: 4.2 mmol/L (ref 3.5–5.1)
SODIUM: 139 mmol/L (ref 136–145)

## 2013-11-30 LAB — URINALYSIS, COMPLETE
BILIRUBIN, UR: NEGATIVE
Glucose,UR: NEGATIVE mg/dL (ref 0–75)
KETONE: NEGATIVE
Leukocyte Esterase: NEGATIVE
NITRITE: NEGATIVE
PH: 8 (ref 4.5–8.0)
Protein: 30
RBC,UR: 927 /HPF (ref 0–5)
Specific Gravity: 1.029 (ref 1.003–1.030)
WBC UR: 4 /HPF (ref 0–5)

## 2013-11-30 LAB — CBC
HCT: 39.8 % (ref 35.0–47.0)
HGB: 13.4 g/dL (ref 12.0–16.0)
MCH: 29.1 pg (ref 26.0–34.0)
MCHC: 33.7 g/dL (ref 32.0–36.0)
MCV: 86 fL (ref 80–100)
Platelet: 204 10*3/uL (ref 150–440)
RBC: 4.61 10*6/uL (ref 3.80–5.20)
RDW: 13.1 % (ref 11.5–14.5)
WBC: 6 10*3/uL (ref 3.6–11.0)

## 2013-12-04 DIAGNOSIS — N83209 Unspecified ovarian cyst, unspecified side: Secondary | ICD-10-CM | POA: Diagnosis not present

## 2013-12-08 DIAGNOSIS — Z3202 Encounter for pregnancy test, result negative: Secondary | ICD-10-CM | POA: Diagnosis not present

## 2013-12-08 DIAGNOSIS — N920 Excessive and frequent menstruation with regular cycle: Secondary | ICD-10-CM | POA: Diagnosis not present

## 2013-12-12 DIAGNOSIS — R5381 Other malaise: Secondary | ICD-10-CM | POA: Diagnosis not present

## 2013-12-12 DIAGNOSIS — E039 Hypothyroidism, unspecified: Secondary | ICD-10-CM | POA: Diagnosis not present

## 2013-12-12 DIAGNOSIS — R5383 Other fatigue: Secondary | ICD-10-CM | POA: Diagnosis not present

## 2013-12-12 DIAGNOSIS — R7301 Impaired fasting glucose: Secondary | ICD-10-CM | POA: Diagnosis not present

## 2013-12-12 DIAGNOSIS — M25579 Pain in unspecified ankle and joints of unspecified foot: Secondary | ICD-10-CM | POA: Diagnosis not present

## 2013-12-14 ENCOUNTER — Ambulatory Visit: Payer: Self-pay

## 2013-12-14 DIAGNOSIS — M25579 Pain in unspecified ankle and joints of unspecified foot: Secondary | ICD-10-CM | POA: Diagnosis not present

## 2013-12-14 DIAGNOSIS — M79609 Pain in unspecified limb: Secondary | ICD-10-CM | POA: Diagnosis not present

## 2013-12-14 DIAGNOSIS — S99919A Unspecified injury of unspecified ankle, initial encounter: Secondary | ICD-10-CM | POA: Diagnosis not present

## 2013-12-14 DIAGNOSIS — S8990XA Unspecified injury of unspecified lower leg, initial encounter: Secondary | ICD-10-CM | POA: Diagnosis not present

## 2013-12-15 DIAGNOSIS — F321 Major depressive disorder, single episode, moderate: Secondary | ICD-10-CM | POA: Diagnosis not present

## 2013-12-15 DIAGNOSIS — F4310 Post-traumatic stress disorder, unspecified: Secondary | ICD-10-CM | POA: Diagnosis not present

## 2013-12-29 DIAGNOSIS — K5900 Constipation, unspecified: Secondary | ICD-10-CM | POA: Diagnosis not present

## 2013-12-29 DIAGNOSIS — R319 Hematuria, unspecified: Secondary | ICD-10-CM | POA: Diagnosis not present

## 2013-12-29 DIAGNOSIS — R300 Dysuria: Secondary | ICD-10-CM | POA: Diagnosis not present

## 2014-01-12 DIAGNOSIS — F321 Major depressive disorder, single episode, moderate: Secondary | ICD-10-CM | POA: Diagnosis not present

## 2014-01-12 DIAGNOSIS — F4310 Post-traumatic stress disorder, unspecified: Secondary | ICD-10-CM | POA: Diagnosis not present

## 2014-01-22 DIAGNOSIS — G43009 Migraine without aura, not intractable, without status migrainosus: Secondary | ICD-10-CM | POA: Diagnosis not present

## 2014-01-22 DIAGNOSIS — N809 Endometriosis, unspecified: Secondary | ICD-10-CM | POA: Diagnosis not present

## 2014-01-22 DIAGNOSIS — F5101 Primary insomnia: Secondary | ICD-10-CM | POA: Diagnosis not present

## 2014-01-22 DIAGNOSIS — R5383 Other fatigue: Secondary | ICD-10-CM | POA: Diagnosis not present

## 2014-01-22 DIAGNOSIS — F4323 Adjustment disorder with mixed anxiety and depressed mood: Secondary | ICD-10-CM | POA: Diagnosis not present

## 2014-01-22 DIAGNOSIS — G4489 Other headache syndrome: Secondary | ICD-10-CM | POA: Diagnosis not present

## 2014-01-26 DIAGNOSIS — R350 Frequency of micturition: Secondary | ICD-10-CM | POA: Diagnosis not present

## 2014-01-26 DIAGNOSIS — R300 Dysuria: Secondary | ICD-10-CM | POA: Diagnosis not present

## 2014-02-25 ENCOUNTER — Emergency Department: Payer: Self-pay | Admitting: Emergency Medicine

## 2014-02-25 DIAGNOSIS — F319 Bipolar disorder, unspecified: Secondary | ICD-10-CM | POA: Diagnosis not present

## 2014-02-25 DIAGNOSIS — J029 Acute pharyngitis, unspecified: Secondary | ICD-10-CM | POA: Diagnosis not present

## 2014-02-25 DIAGNOSIS — Z79899 Other long term (current) drug therapy: Secondary | ICD-10-CM | POA: Diagnosis not present

## 2014-02-25 DIAGNOSIS — R61 Generalized hyperhidrosis: Secondary | ICD-10-CM | POA: Diagnosis not present

## 2014-02-25 DIAGNOSIS — R51 Headache: Secondary | ICD-10-CM | POA: Diagnosis not present

## 2014-02-25 DIAGNOSIS — G43909 Migraine, unspecified, not intractable, without status migrainosus: Secondary | ICD-10-CM | POA: Diagnosis not present

## 2014-02-25 DIAGNOSIS — R42 Dizziness and giddiness: Secondary | ICD-10-CM | POA: Diagnosis not present

## 2014-02-25 DIAGNOSIS — B9689 Other specified bacterial agents as the cause of diseases classified elsewhere: Secondary | ICD-10-CM | POA: Diagnosis not present

## 2014-02-25 DIAGNOSIS — R079 Chest pain, unspecified: Secondary | ICD-10-CM | POA: Diagnosis not present

## 2014-02-25 DIAGNOSIS — N390 Urinary tract infection, site not specified: Secondary | ICD-10-CM | POA: Diagnosis not present

## 2014-02-25 LAB — CBC
HCT: 46.5 % (ref 35.0–47.0)
HGB: 15.8 g/dL (ref 12.0–16.0)
MCH: 28.6 pg (ref 26.0–34.0)
MCHC: 33.9 g/dL (ref 32.0–36.0)
MCV: 84 fL (ref 80–100)
PLATELETS: 220 10*3/uL (ref 150–440)
RBC: 5.53 10*6/uL — AB (ref 3.80–5.20)
RDW: 13.3 % (ref 11.5–14.5)
WBC: 8.1 10*3/uL (ref 3.6–11.0)

## 2014-02-25 LAB — BASIC METABOLIC PANEL
Anion Gap: 6 — ABNORMAL LOW (ref 7–16)
BUN: 12 mg/dL (ref 7–18)
CALCIUM: 8.8 mg/dL (ref 8.5–10.1)
Chloride: 107 mmol/L (ref 98–107)
Co2: 29 mmol/L (ref 21–32)
Creatinine: 0.82 mg/dL (ref 0.60–1.30)
EGFR (African American): >60
Glucose: 89 mg/dL (ref 65–99)
OSMOLALITY: 282 (ref 275–301)
Potassium: 3.9 mmol/L (ref 3.5–5.1)
Sodium: 142 mmol/L (ref 136–145)

## 2014-02-25 LAB — URINALYSIS, COMPLETE
BILIRUBIN, UR: NEGATIVE
Blood: NEGATIVE
Glucose,UR: NEGATIVE mg/dL (ref 0–75)
KETONE: NEGATIVE
Nitrite: NEGATIVE
PH: 7 (ref 4.5–8.0)
RBC,UR: 4 /HPF (ref 0–5)
SPECIFIC GRAVITY: 1.025 (ref 1.003–1.030)
WBC UR: 25 /HPF (ref 0–5)

## 2014-02-26 LAB — TROPONIN I

## 2014-02-26 LAB — PREGNANCY, URINE: Pregnancy Test, Urine: NEGATIVE m[IU]/mL

## 2014-02-27 LAB — URINE CULTURE

## 2014-03-06 DIAGNOSIS — F330 Major depressive disorder, recurrent, mild: Secondary | ICD-10-CM | POA: Diagnosis not present

## 2014-03-06 DIAGNOSIS — F5101 Primary insomnia: Secondary | ICD-10-CM | POA: Diagnosis not present

## 2014-03-06 DIAGNOSIS — Z23 Encounter for immunization: Secondary | ICD-10-CM | POA: Diagnosis not present

## 2014-03-06 DIAGNOSIS — G43009 Migraine without aura, not intractable, without status migrainosus: Secondary | ICD-10-CM | POA: Diagnosis not present

## 2014-03-09 DIAGNOSIS — Z3042 Encounter for surveillance of injectable contraceptive: Secondary | ICD-10-CM | POA: Diagnosis not present

## 2014-04-20 DIAGNOSIS — F321 Major depressive disorder, single episode, moderate: Secondary | ICD-10-CM | POA: Diagnosis not present

## 2014-04-20 DIAGNOSIS — F4310 Post-traumatic stress disorder, unspecified: Secondary | ICD-10-CM | POA: Diagnosis not present

## 2014-04-21 ENCOUNTER — Emergency Department: Payer: Self-pay | Admitting: Emergency Medicine

## 2014-04-21 DIAGNOSIS — Z79899 Other long term (current) drug therapy: Secondary | ICD-10-CM | POA: Diagnosis not present

## 2014-04-21 DIAGNOSIS — G43909 Migraine, unspecified, not intractable, without status migrainosus: Secondary | ICD-10-CM | POA: Diagnosis not present

## 2014-04-21 LAB — URINALYSIS, COMPLETE
BILIRUBIN, UR: NEGATIVE
Blood: NEGATIVE
Glucose,UR: NEGATIVE mg/dL (ref 0–75)
KETONE: NEGATIVE
Nitrite: NEGATIVE
PH: 5 (ref 4.5–8.0)
PROTEIN: NEGATIVE
RBC,UR: 5 /HPF (ref 0–5)
Specific Gravity: 1.025 (ref 1.003–1.030)

## 2014-04-21 LAB — BASIC METABOLIC PANEL
ANION GAP: 10 (ref 7–16)
BUN: 11 mg/dL (ref 7–18)
CALCIUM: 8.4 mg/dL — AB (ref 8.5–10.1)
CHLORIDE: 110 mmol/L — AB (ref 98–107)
CO2: 21 mmol/L (ref 21–32)
CREATININE: 0.65 mg/dL (ref 0.60–1.30)
EGFR (Non-African Amer.): >60
GLUCOSE: 69 mg/dL (ref 65–99)
Osmolality: 279 (ref 275–301)
Potassium: 4.2 mmol/L (ref 3.5–5.1)
Sodium: 141 mmol/L (ref 136–145)

## 2014-04-21 LAB — TROPONIN I

## 2014-05-04 DIAGNOSIS — F4310 Post-traumatic stress disorder, unspecified: Secondary | ICD-10-CM | POA: Diagnosis not present

## 2014-05-09 DIAGNOSIS — F4323 Adjustment disorder with mixed anxiety and depressed mood: Secondary | ICD-10-CM | POA: Diagnosis not present

## 2014-05-09 DIAGNOSIS — N800 Endometriosis of uterus: Secondary | ICD-10-CM | POA: Diagnosis not present

## 2014-05-09 DIAGNOSIS — G43009 Migraine without aura, not intractable, without status migrainosus: Secondary | ICD-10-CM | POA: Diagnosis not present

## 2014-05-11 DIAGNOSIS — E039 Hypothyroidism, unspecified: Secondary | ICD-10-CM | POA: Diagnosis not present

## 2014-05-11 DIAGNOSIS — R102 Pelvic and perineal pain: Secondary | ICD-10-CM | POA: Diagnosis not present

## 2014-05-11 DIAGNOSIS — R7301 Impaired fasting glucose: Secondary | ICD-10-CM | POA: Diagnosis not present

## 2014-05-11 DIAGNOSIS — N809 Endometriosis, unspecified: Secondary | ICD-10-CM | POA: Diagnosis not present

## 2014-05-11 DIAGNOSIS — E559 Vitamin D deficiency, unspecified: Secondary | ICD-10-CM | POA: Diagnosis not present

## 2014-05-11 DIAGNOSIS — R5383 Other fatigue: Secondary | ICD-10-CM | POA: Diagnosis not present

## 2014-06-01 DIAGNOSIS — F79 Unspecified intellectual disabilities: Secondary | ICD-10-CM | POA: Diagnosis not present

## 2014-06-01 DIAGNOSIS — F4310 Post-traumatic stress disorder, unspecified: Secondary | ICD-10-CM | POA: Diagnosis not present

## 2014-06-05 DIAGNOSIS — R110 Nausea: Secondary | ICD-10-CM | POA: Diagnosis not present

## 2014-06-05 DIAGNOSIS — R945 Abnormal results of liver function studies: Secondary | ICD-10-CM | POA: Diagnosis not present

## 2014-06-05 DIAGNOSIS — R1084 Generalized abdominal pain: Secondary | ICD-10-CM | POA: Diagnosis not present

## 2014-06-06 DIAGNOSIS — R1084 Generalized abdominal pain: Secondary | ICD-10-CM | POA: Diagnosis not present

## 2014-06-06 DIAGNOSIS — R945 Abnormal results of liver function studies: Secondary | ICD-10-CM | POA: Diagnosis not present

## 2014-06-06 DIAGNOSIS — R110 Nausea: Secondary | ICD-10-CM | POA: Diagnosis not present

## 2014-06-07 DIAGNOSIS — R1084 Generalized abdominal pain: Secondary | ICD-10-CM | POA: Diagnosis not present

## 2014-06-07 DIAGNOSIS — R945 Abnormal results of liver function studies: Secondary | ICD-10-CM | POA: Diagnosis not present

## 2014-06-29 DIAGNOSIS — F4310 Post-traumatic stress disorder, unspecified: Secondary | ICD-10-CM | POA: Diagnosis not present

## 2014-06-29 DIAGNOSIS — F79 Unspecified intellectual disabilities: Secondary | ICD-10-CM | POA: Diagnosis not present

## 2014-07-06 ENCOUNTER — Emergency Department
Admit: 2014-07-06 | Disposition: A | Discharge status: Home / Self Care | Payer: Self-pay | Admitting: Emergency Medicine

## 2014-07-06 DIAGNOSIS — Z3202 Encounter for pregnancy test, result negative: Secondary | ICD-10-CM | POA: Diagnosis not present

## 2014-07-06 DIAGNOSIS — D259 Leiomyoma of uterus, unspecified: Secondary | ICD-10-CM | POA: Diagnosis not present

## 2014-07-06 DIAGNOSIS — N390 Urinary tract infection, site not specified: Secondary | ICD-10-CM | POA: Diagnosis not present

## 2014-07-06 DIAGNOSIS — R102 Pelvic and perineal pain: Secondary | ICD-10-CM | POA: Diagnosis not present

## 2014-07-06 DIAGNOSIS — D251 Intramural leiomyoma of uterus: Secondary | ICD-10-CM | POA: Diagnosis not present

## 2014-07-06 DIAGNOSIS — D219 Benign neoplasm of connective and other soft tissue, unspecified: Secondary | ICD-10-CM | POA: Diagnosis not present

## 2014-07-07 DIAGNOSIS — D251 Intramural leiomyoma of uterus: Secondary | ICD-10-CM | POA: Diagnosis not present

## 2014-07-07 LAB — URINALYSIS, COMPLETE
GLUCOSE, UR: NEGATIVE mg/dL (ref 0–75)
Nitrite: NEGATIVE
Ph: 5 (ref 4.5–8.0)
Protein: 100
RBC,UR: 2 /HPF (ref 0–5)
Specific Gravity: 1.029 (ref 1.003–1.030)

## 2014-07-07 LAB — CBC WITH DIFFERENTIAL/PLATELET
BASOS ABS: 0 10*3/uL (ref 0.0–0.1)
BASOS PCT: 0.6 %
Eosinophil #: 0 10*3/uL (ref 0.0–0.7)
Eosinophil %: 0.7 %
HCT: 46.6 % (ref 35.0–47.0)
HGB: 15.6 g/dL (ref 12.0–16.0)
LYMPHS PCT: 30.9 %
Lymphocyte #: 1.6 10*3/uL (ref 1.0–3.6)
MCH: 28.4 pg (ref 26.0–34.0)
MCHC: 33.4 g/dL (ref 32.0–36.0)
MCV: 85 fL (ref 80–100)
Monocyte #: 0.4 x10 3/mm (ref 0.2–0.9)
Monocyte %: 7.6 %
NEUTROS PCT: 60.2 %
Neutrophil #: 3.2 10*3/uL (ref 1.4–6.5)
Platelet: 156 10*3/uL (ref 150–440)
RBC: 5.49 10*6/uL — ABNORMAL HIGH (ref 3.80–5.20)
RDW: 13.6 % (ref 11.5–14.5)
WBC: 5.3 10*3/uL (ref 3.6–11.0)

## 2014-07-07 LAB — COMPREHENSIVE METABOLIC PANEL
ALK PHOS: 120 U/L
ALT: 381 U/L — AB
AST: 226 U/L — AB
Albumin: 4.3 g/dL
Anion Gap: 10 (ref 7–16)
BUN: 7 mg/dL
Bilirubin,Total: 0.7 mg/dL
CO2: 18 mmol/L — AB
CREATININE: 0.7 mg/dL
Calcium, Total: 9 mg/dL
Chloride: 108 mmol/L
EGFR (Non-African Amer.): >60
Glucose: 93 mg/dL
Potassium: 3.6 mmol/L
Sodium: 136 mmol/L
Total Protein: 7.8 g/dL

## 2014-07-07 LAB — TROPONIN I

## 2014-07-07 LAB — LIPASE, BLOOD: LIPASE: 100 U/L — AB

## 2014-07-07 LAB — WET PREP, GENITAL

## 2014-07-10 DIAGNOSIS — F330 Major depressive disorder, recurrent, mild: Secondary | ICD-10-CM | POA: Diagnosis not present

## 2014-07-10 DIAGNOSIS — R634 Abnormal weight loss: Secondary | ICD-10-CM | POA: Diagnosis not present

## 2014-07-10 DIAGNOSIS — D259 Leiomyoma of uterus, unspecified: Secondary | ICD-10-CM | POA: Diagnosis not present

## 2014-07-10 DIAGNOSIS — Z0001 Encounter for general adult medical examination with abnormal findings: Secondary | ICD-10-CM | POA: Diagnosis not present

## 2014-07-10 DIAGNOSIS — R945 Abnormal results of liver function studies: Secondary | ICD-10-CM | POA: Diagnosis not present

## 2014-07-10 DIAGNOSIS — R1084 Generalized abdominal pain: Secondary | ICD-10-CM | POA: Diagnosis not present

## 2014-07-10 DIAGNOSIS — G43019 Migraine without aura, intractable, without status migrainosus: Secondary | ICD-10-CM | POA: Diagnosis not present

## 2014-07-10 DIAGNOSIS — R7301 Impaired fasting glucose: Secondary | ICD-10-CM | POA: Diagnosis not present

## 2014-07-13 ENCOUNTER — Ambulatory Visit
Admit: 2014-07-13 | Disposition: A | Discharge status: Home / Self Care | Payer: Self-pay | Attending: Nurse Practitioner | Admitting: Nurse Practitioner

## 2014-07-17 DIAGNOSIS — F4310 Post-traumatic stress disorder, unspecified: Secondary | ICD-10-CM | POA: Diagnosis not present

## 2014-07-17 DIAGNOSIS — F79 Unspecified intellectual disabilities: Secondary | ICD-10-CM | POA: Diagnosis not present

## 2014-07-27 DIAGNOSIS — R300 Dysuria: Secondary | ICD-10-CM | POA: Diagnosis not present

## 2014-07-27 NOTE — H&P (Signed)
PATIENT NAME:  Teresa Crawford, Teresa Crawford MR#:  [id number] DATE OF BIRTH:  [birth year]  DATE OF ADMISSION:  02/25/2013  PRIMARY CARE PHYSICIAN: Clayborn Bigness, M.D.  REFERRING PHYSICIAN:  Conni Slipper, M.D.   CHIEF COMPLAINT:  Epigastric abdominal pain and dark blood in the stool.   HISTORY OF PRESENT ILLNESS: The patient is a 29 year old Caucasian female with a past medical history of chronic headache and low back pain following up with pain management as an outpatient, was seen by gastroenterology last Tuesday for history of melena and had EGD and colonoscopy done. After the procedure, she was diagnosed with gastroduodenal ulcers and she was recommended to take proton pump inhibitor twice a day and she was sent home. The patient started having epigastric abdominal pain after the procedure since Tuesday, and also, she continues to have blood in her stool. As the epigastric abdominal pain is not improving, the patient came into the ER. The patient's hemoglobin was 15 with hematocrit 43.8 prior to the procedure and today hemoglobin is at 14 and hematocrit is 42.7. CAT scan of the abdomen and pelvis revealed no acute findings. The patient was looking pale when compared to her normal, as reported by her sister. The ER physician, Dr. Conni Slipper, has called on-call gastroenterologist, Dr. Gustavo Lah, who has suggested to observe the patient overnight and repeat hemoglobin and hematocrit. The patient has received IV morphine for pain in the ER and also IV Phenergan. After Phenergan was given, within a few minutes, the patient became tachycardic and heart rate went up to 150s to 160s. The patient denied any chest pain, palpitations or shortness of breath during that episode. Sinus tachycardia after administration of Phenergan was assumed to be an allergic reaction and the patient was given IV Benadryl and fluid boluses. During second IV bolus, the patient's heart rate came down from 160s to 118. The patient denies any chest  pain, shortness of breath or palpitations to suggest sinus tachycardia. Sister is at bedside. The patient was given IV morphine regarding her epigastric abdominal pain. Denies any nausea or vomiting. No sick contacts. No other complaints. The patient was tested for H. Pylori during EGD and colonoscopy which was returned to be negative.   PAST MEDICAL HISTORY:  Chronic headache, follows up with pain management as an outpatient, chronic low back pain, hypothyroidism, recent diagnosis of gastroduodenal ulcer by EGD and colonoscopy on last Tuesday, chronic fatigue syndrome, scoliosis.   PAST SURGICAL HISTORY: Back surgery at age 26, ovarian cyst resection bilaterally.  ALLERGIES:  SHE IS ALLERGIC TO CODEINE, EFFEXOR, IMITREX AND PAXIL.    PSYCHOSOCIAL HISTORY: Lives at home with sister. No history of smoking, alcohol or illicit drug usage.   FAMILY HISTORY: Mother deceased at age 97 from breast cancer.   HOME MEDICATIONS: Topamax 25 mg 1 tablet p.o. once a day, prednisone 10 mg 3 tablets orally once a day for 3 days, Phenergan 25 mg p.o. p.r.n., ondansetron 4 mg every six hours, Bactrim DS 2 times a day for 10 days, aspirin 81 mg once daily, acyclovir 200 mg p.o. 5 times a day, Tylenol  1 capsule by mouth every 4 to 6 hours. On further questioning, the patient has reported that she is not on Acyclovir and Bactrim currently.   REVIEW OF SYSTEMS: CONSTITUTIONAL: Denies any fever, but has chronic fatigue and weakness.  EYES: Denies blurry vision, glaucoma.  ENT: Denies epistaxis, discharge.  RESPIRATORY: Denies cough or COPD. Denies any painful respiration, shortness of breath.  CARDIOVASCULAR: Denies any  chest pain, palpitations. Denies any dizziness.  GASTROINTESTINAL: Complaining of nausea, but denies any vomiting, dark stool mixed with blood, complaining of epigastric abdominal pain. No hematemesis. Patient recently diagnosed with gastroduodenal ulcers with EGD and colonoscopy done last Tuesday.    GENITOURINARY: No dysuria, hematuria or renal calculi.   BREASTS: Denies any breast masses.  GYNECOLOGIC: The patient had ovarian cyst resection and diagnosis of fibroids recently.  ENDOCRINE: Denies polyuria, nocturia, states that she has hypothyroidism, but not on any medications.  INTEGUMENTARY: No acne, rash, lesions.  MUSCULOSKELETAL: Chronic headache and chronic low back pain. Denies any gout.  NEUROLOGIC: Denies any vertigo, ataxia. PSYCHIATRIC: No ADD, OCD.    PHYSICAL EXAMINATION: VITAL SIGNS: Temperature 98.5, pulse 119, respirations 18, blood pressure 138/78, pulse oximetry 99% on room air.   GENERAL APPEARANCE: Not in acute distress. Moderately built and obese.  HEENT: Normocephalic, atraumatic. Pupils are equally reactive to light and accommodation. No scleral icterus. No conjunctival injection. Extraocular movements are intact. No sinus tenderness. No postnasal drip. Dry mucous membranes. Uvula is midline.  NECK: Supple. No JVD or thyromegaly. Range of motion is intact.  LUNGS: Clear to auscultation bilaterally. No accessory muscle use and no anterior chest wall tenderness on palpation.  CARDIAC: S1, S2, normal tachycardic. No murmurs.  GASTROINTESTINAL: Soft, obese. Bowel sounds are positive in all four quadrants. Positive epigastric tenderness with no rebound tenderness. No guarding. No masses. No hepatosplenomegaly.  NEUROLOGIC: Awake, alert, oriented x 3. Cranial nerves II through XII are grossly intact. Motor and sensory are intact. Reflexes are 2+.  EXTREMITIES: No edema. No cyanosis. No clubbing.  SKIN: Warm to touch. Dry in nature. Normal turgor. No rashes. No lesions  MUSCULOSKELETAL: No joint effusion, tenderness, erythema.  PSYCHIATRIC: Normal mood and affect.   LABORATORY AND IMAGING STUDIES: CAT scan of the abdomen and pelvis with contrast has revealed no acute intraabdominal findings, probably secondary to fibroid. Urine drug pregnancy test is negative. Glucose  97, BUN 4, creatinine 0.7, sodium 140, potassium 3.8, chloride 115, CO2 21, GFR greater than 60. Anion gap 6, serum osmolality 280. Calcium 9.2 lipase 75. LFTs are normal. WBC 6.6, hemoglobin 14.6, hematocrit 40.7, platelets 230. MCV is 84.   URINALYSIS:  Cloudy in appearance, ketones 1+, nitrites negative, leukocyte esterase trace.   Recent EGD done by Dr. Arther Dames on 11/18 has revealed gastritis, multiple sites biopsied. The patient also had colonoscopy on the same day, on last Tuesday, regarding rectal bleeding and rectal pain. It revealed erythematous  mucosa in the distal rectum, biopsied. Examination is otherwise normal. Recommended proton pump inhibitor twice a day for four weeks followed by once daily.   ASSESSMENT AND PLAN: A 29 year old Caucasian female presented to the ER with a chief complaint of acute epigastric abdominal pain with ongoing problem of melena,  had EEG and colonoscopy done on last Tuesday. Will be admitted with following assessment and plan.  1.  Acute epigastric abdominal pain and melena probably from bleeding from gastroduodenal ulcers. We will put her on clear liquid diet, provide IV fluids and proton pump inhibitor.  2.  Will monitor hemoglobin and hematocrit q.6 hours.  3.  A surgical consult is placed to Dr. Gustavo Lah and stool for Hemoccult. IV morphine as needed for pain.  4.  Sinus tachycardia, probably from allergic reaction to Phenergan. We will provide her IV fluids and cycle cardiac biomarkers. Heart rate is improving after fluid boluses. We will also continue Benadryl.  5.  Chronic headache and chronic low back  pain. Outpatient follow-up as her pain management as recommended.  6.   Hypothyroidism. Currently, the patient is not on any medication. We will check TSH.  7.  Possible acute cystitis. We will get urine cultures, and the patient will be on IV antibiotic Rocephin empirically.  8.   Hypokalemia. We will provide her IV fluids with potassium supplement.   9.   Chronic headache syndrome.  10.  We will provide her gastrointestinal prophylaxis with Protonix twice  a day as suggested by gastroenterology. 11.  Deep venous prophylaxis with sequential compression devices as the patient has melena.   Diagnosis and plan of care was discussed with the patient and her sister at bedside. They both verbalized understanding of the plan. CODE STATUS: She is full code. Sister is the medical power of attorney.   Total time spent on admission is 50 minutes.     ____________________________ Nicholes Mango, MD ag:NTS D: 02/25/2013 00:51:58 ET T: 02/25/2013 01:40:10 ET JOB#: [id number]  cc: Nicholes Mango, MD, <Dictator> Lavera Guise, MD Lollie Sails, MD Nicholes Mango MD ELECTRONICALLY SIGNED 03/08/2013 7:11

## 2014-07-27 NOTE — Consult Note (Signed)
Chief Complaint:  Subjective/Chief Complaint seen for melena and abdominalpain.  No recurrent melena, mild nausea no emesis, loose stool without gross blood, abdominal pain improving.   VITAL SIGNS/ANCILLARY NOTES: **Vital Signs.:   23-Nov-14 14:33  Vital Signs Type Upon Transfer  Temperature Temperature (F) 97.6  Celsius 36.4  Temperature Source oral  Pulse Pulse 77  Respirations Respirations 20  Systolic BP Systolic BP 440  Diastolic BP (mmHg) Diastolic BP (mmHg) 84  Mean BP 99  Pulse Ox % Pulse Ox % 100  Pulse Ox Activity Level  At rest  Oxygen Delivery Room Air/ 21 %  *Intake and Output.:   23-Nov-14 14:34  Stool  moderate loose stool    18:03  Stool  Patient reports 2 episodes of diarrhea this afternoon    18:10  Stool  small, very loose light brown stool   Brief Assessment:  Cardiac Regular   Respiratory clear BS   Gastrointestinal details normal Soft  Nondistended  Bowel sounds normal  No rebound tenderness  tender to palpation in the left abdomen, mostly luq   Assessment/Plan:  Assessment/Plan:  Assessment 1) abdominal pain and rectal bleeding/melena post endoscopic proceedures. overall improving, interested in eating some, no furhter blood per rectum, abd pain lessening on exam, CT without evidence of acute injury. 2) multiple gastric and duodenal ulcers, possible colitis.  h. pylori and IBD panel pending.   Plan 1) continue current. will advance diet to full liquids, hold at that for now.   Electronic Signatures: Loistine Simas (MD)  (Signed [phone] 19:20)  Authored: Chief Complaint, VITAL SIGNS/ANCILLARY NOTES, Brief Assessment, Assessment/Plan   Last Updated: 23-Nov-14 19:20 by Loistine Simas (MD)

## 2014-07-27 NOTE — Consult Note (Signed)
Brief Consult Note: Diagnosis: melena, rectal bleeding abdominal pain.   Patient was seen by consultant.   Consult note dictated.   Recommend further assessment or treatment.   Orders entered.   Discussed with Attending MD.   Comments: Please see full GI consult [phone].  Patient admitted with luq abdominal pain with recent egd showing multiple gastric and duodenal ulcers, colonoscopy with possible colitis.  Will order serology for IBDz, continue iv ppi as you are.  No evidence of ongoing bleeding, hemodynamically stable. Following.  Electronic Signatures: Loistine Simas (MD)  (Signed [phone] 16:38)  Authored: Brief Consult Note   Last Updated: 22-Nov-14 16:38 by Loistine Simas (MD)

## 2014-07-27 NOTE — Consult Note (Signed)
PATIENT NAME:  Teresa Crawford, Teresa Crawford MR#:  [id number] DATE OF BIRTH:  02-16-1986  DATE OF CONSULTATION:  03/01/2013  CONSULTING PHYSICIAN:  Gonzella Lex, MD  IDENTIFYING INFORMATION AND REASON FOR CONSULT: A 29 year old woman currently in the hospital for work-up of abdominal pain. Reason for consult for anxiety.   HISTORY OF PRESENT ILLNESS: Information obtained from the patient and the chart. The patient is here in the hospital having complained of blood in her stool and abdominal pain. She continues to complain of severe abdominal pain and that is her chief complaint to me as well. She says that it comes and goes. At times it is so bad that it makes her cry. She is unable to give me much detail about it. She is remarkably vague in not being able to describe it very well nor to tell me when it gets worse or what might help to relieve it. She says that none of the pain medicines and been helpful. Psychiatrically she says that, of course, she is upset by her pain but denies that she is feeling depressed or sad. Denies feeling hopeless. Says she has positive things in her life and thing she is looking forward to. She says that her sleep is interrupted only when she gets abdominal pain. Her appetite had been poor during the time that she was not allowed to eat but she says today she is "starving." She completely denies any suicidal ideation. Denies any homicidal ideation. Denies any psychotic symptoms. She says that her life at home is not very stressful. She seems rather vague about her plans for the future. In short she is not presenting with any specific psychiatric syndrome. She denies that she is feeling overwhelmed with stress. She denies that she is having panic attacks. Denies a lot of anxiety. She does not present as having depressive symptoms. She says that she has chronic fatigue and has been diagnosed in the past with "chronic fatigue syndrome." She had been treated in the past with Cymbalta but  ironically she says that it only made her more tired and made her stomach more upset so she stopped taking it. She cannot remember if she has been given anything else to try and treat this condition.   PAST PSYCHIATRIC HISTORY: She says that she has had therapy years ago at the time that her mother died, but denies any history of suicide attempts. Denies any history of violence. Denies any history of psychotic symptoms. She remembers having been given Cymbalta in the past but as I mentioned, said that it just caused her to have side effects, did not think it helped at all. Never had a psychiatric hospitalization. It does look like psychiatric consults were considered by the pain management as well. From what I can tell, this is essentially because she is continuing to have so many physiologic complaints without a really clear syndrome that raises concerns about somatization.   PAST MEDICAL HISTORY: About 7 or 8 years ago she had surgery for ovarian cysts. I do not see that there has been any follow-up further from that.  She has been followed in the pain clinic for chronic headaches. She has a history of having had seizure-like activity and has had two EEGs which were normal. I do not see that anyone out and out said she had pseudoseizures but there is a suggestion of it there.  Her primary care doctor has told her that she has chronic fatigue syndrome. In all, she seems to, other  than the ovarian cyst, have had multiple complaints without a specific physiologic cause and without any clear resolution of it. This does suggest the possibility of somatization disorder.   MEDICATIONS: At admission this time, she was taking Topamax 25 mg once a day presumably for the headache, prednisone 10 mg 3 tablets once a day for 3 days, although it looks like she probably is already off that. Phenergan p.r.n., Zofran p.r.n., aspirin once a day, acyclovir 5 times a day.   ALLERGIES: CODEINE, EFFEXOR, IMITREX, PAXIL.    SOCIAL HISTORY: The patient lives with her sister. The patient does not work. She says that she is not able to work because every time she tries her medical problems get worse. She has tried to do some schooling at [employer], but also it was interrupted by her medical problems. She does not have any children of her own. 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 That seems to be the only activity she has and she seems content with it.   FAMILY HISTORY: Her mother died of breast cancer and the patient is very focused on that fact and tells me she is quite afraid of it. It is the only thing she specifically mention being afraid of.   REVIEW OF SYSTEMS:  Complains of abdominal pain, vaguely described. Otherwise, currently does not have specific complaints. Does not report headaches. Denies suicidal or homicidal ideation. Denies any hallucinations. Denies feeling depressed. No other specific physical complaints right now.   MENTAL STATUS EXAM:  A somewhat disheveled woman whose demeanor and appearance makes her look younger than her stated age. She was kind of passively cooperative with the interview. At first, she was so passive, that I wondered if she was developmentally disabled, but she opened up and started talking a little bit more as she relaxed. Eye contact was good. Psychomotor activity was normal. Speech was ultimately of normal rate, tone and volume. Affect reactive, mildly dysphoric but not tearful. Mood stated as being okay I guess. Thoughts are lucid. No indication of loosening of associations or delusions. No paranoia. Denies suicidal or homicidal ideation. Denies any hallucinations. Intelligence: Hard to gauge completely, probably in the average range overall. Alert and oriented x 4. Judgment and insight grossly adequate.   VITAL SIGNS: Current blood pressure 90/68, pulse 90, temperature 98.8.   LABORATORY RESULTS: I will not review on tape all of the lab results  that have come up. I do that she had at least 1 positive urine culture. I am not sure if that was considered to be a real infection or not. Her stool seems not to have any positive results on lab tests. She is not currently anemic. White count is only slightly low at 3.4.   ASSESSMENT: This is a 29 year old woman who presents with multiple physical complaints that have been resistant to satisfactory diagnosis or treatment. Patients like this can be extremely frustrating for the care providers as well as for the patients themselves. She is continuing to complain of pain and she has been getting prescribed lots of narcotic pain medicine without really clear relief so far, which also is of concern. On psychiatric exam. I do not see evidence of major depression. I do not see clear evidence of any specific anxiety disorder. She is not psychotic. I do not see that there is a history where she has been identified as a substance abuser. The patient probably has a type of somatization disorder with excessive focus on her physical  symptoms.   TREATMENT PLAN: Unfortunately, there is no specific treatment for patients with somatization especially when they do not have much insight into it. Probably the best thing is to do the appropriate work-up, manage safety and then to try and discharge her and not overtreated it. I am concerned that she may at risk for opiate dependence if she keeps getting more and more narcotic without clear relief. I would not add benzodiazepines to what she is taking. I would not add a serotonin reuptake inhibitor at this point as I do not see a clear target for it and she has complained of side effects previously. I encouraged the patient to cooperate with the treatment team and to focus on positive things in her life and what she is going to do when she gets home.   I would hasten to point out that somatization disorder does not mean that a person does not have medical problems or does not have  genuine distress. I am sure that she does have pain and I am not suggesting that she is malingering or being manipulative, but that rather she may have an excessive focus on her somatic symptoms that magnifies the distress she is feeling of them.   DIAGNOSIS, PRINCIPAL AND PRIMARY:  AXIS I: Somatization disorder.   SECONDARY DIAGNOSES: AXIS I: No further diagnosis.  AXIS II: Deferred.  AXIS III: Abdominal pain, headaches.  AXIS IV: Moderate from burden of illness.  AXIS V: Function at time of evaluation 45.    ____________________________ Gonzella Lex, MD jtc:dp D: 03/01/2013 13:13:59 ET T: 03/01/2013 13:54:32 ET JOB#: [id number]  cc: Gonzella Lex, MD, <Dictator> Gonzella Lex MD ELECTRONICALLY SIGNED 03/01/2013 15:40

## 2014-07-27 NOTE — Consult Note (Signed)
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   Patient was seen by consultant.   Consult note dictated.   Comments: Psychiatry: Patient seen and chart reviewed. Full note dictated. No specific syndrome. Long hx multiple medical complaints. May have somitization do. At risk for narcotic dependence. No specific psych treatment indicated.  Electronic Signatures: Gonzella Lex (MD)  (Signed [phone] 13:01)  Authored: Brief Consult Note   Last Updated: 26-Nov-14 13:01 by Gonzella Lex (MD)

## 2014-07-27 NOTE — Consult Note (Signed)
PATIENT NAME:  Teresa Crawford, SHOE MR#:  [id number] DATE OF BIRTH:  10/25/1985  DATE OF CONSULTATION:  02/25/2013  REFERRING PHYSICIAN:  Gladstone Lighter, MD CONSULTING PHYSICIAN:  Lollie Sails, MD  REASON FOR CONSULTATION: Melena and epigastric pain.   HISTORY OF PRESENT ILLNESS: Ms. Teresa Crawford is a 29 year old Caucasian female who was admitted to the hospital very late last night in regards to rectal bleeding and abdominal pain. She had undergone GI evaluation earlier in the week, having an EGD and colonoscopy on 02/21/2013. This was done because of problems of rectal bleeding and rectal pain as well as epigastric abdominal pain and reported melena. On EGD, she was found to have multiple gastric ulcers with clean base as well as gastritis. There were also multiple duodenal ulcers with a clean base as well. She was advised to use Prilosec 40 mg b.i.d. for 8 weeks, then down to 40 mg daily. There were biopsies taken, which will follow. Her colonoscopy showed some mild erythema in the distal rectum. The biopsies' results were as follows: Stomach showed no evidence of gastritis, dysplasia or malignancy. There was some superficial congestion and edema. No Helicobacter pylori. Biopsies of the right colon were showing focal active colitis, 1 of 3 fragments, no evidence of chronicity. The left colon biopsy was negative for colitis. The rectal random biopsies showed a focal active proctitis, again no evidence of chronicity. In review, she has a history of rectal bleeding and diarrhea. This began several months ago. This was also associated with lower abdominal pain. There has been some nausea but no vomiting. Several months ago, she was taking ibuprofen quite a bit for this discomfort, but it apparently in the interim has stopped. She did see GI at [facility] a couple of weeks ago and had the procedures arranged. She stated that when she woke up from the EGD, she had a new left upper quadrant pain; however, evaluation  otherwise has been uninformative. She states she has a bowel movement daily that is variable and some recent diarrhea. These symptoms actually go back a bit further. She was seen by her primary physician this past summer, and he did an upper GI series in [place]. I do not have this result. She was told that it was uninformative/negative. Since her procedure, she has noted some blood per rectum, became concerned and came to the Emergency Room. The left upper quadrant pain as noted above.   GASTROINTESTINAL FAMILY HISTORY: Pertinent for grandfather with colon cancer. Negative for peptic ulcer disease, negative for inflammatory bowel disease.   PAST MEDICAL HISTORY: History of chronic headache, under care of pain management as an outpatient. She has had chronic low back pain, hypothyroidism, history of chronic fatigue syndrome, and apparently surgery in the past for scoliosis. She has a history of an ovarian cyst resection as well as a history of uterine fibroids.   ALLERGIES: THE PATIENT IS ALLERGIC TO CODEINE, EFFEXOR, IMITREX, PAXIL.   OUTPATIENT MEDICATIONS: Include acyclovir 200 mg 1 capsule 5 times a day as needed, Bactrim DS 1 twice a day for 10 days, ondansetron 4 mg q.6 hours p.r.n., Phenergan 25 mg p.r.n., Topamax 50 mg once a day at bedtime.   REVIEW OF SYSTEMS: Ten systems reviewed per admission history and physical, agree with same.   PHYSICAL EXAMINATION: VITAL SIGNS: Temperature is 98.3, pulse 86, respirations 20, blood pressure 99/60, pulse oximetry 100%.  GENERAL: She is a well-appearing 29 year old Caucasian female in no acute distress.  HEENT: Normocephalic, atraumatic. Eyes are anicteric.  Nose: Septum midline. No lesions. Oropharynx: No lesions.  NECK: Supple. No JVD. No lymphadenopathy. No thyromegaly.  HEART: Regular rate and rhythm.  LUNGS: Bilaterally clear.  ABDOMEN: Soft. She is markedly tender to palpation in the left upper quadrant. There is some mild tenderness in the  left lower quadrant. There is no mass, rebound, or organomegaly noted.  RECTAL: Anorectal examination shows the vault to be empty, and mucus was tested for heme, however, negative. Most likely with the vault being empty, this would be inconclusive at any rate.  EXTREMITIES: No clubbing, cyanosis, or edema.  NEUROLOGICAL: Cranial nerves II through XII grossly intact. Muscle strength bilaterally equal and symmetric. DTRs bilaterally equal and symmetric.   LABORATORY AND RADIOLOGICAL DATA: Yesterday evening on admission to the hospital, she had a glucose 97, BUN 4, creatinine 0.73, sodium 142, potassium 3.3, chloride 115, bicarbonate 21, calcium 9.2, lipase 75. Hepatic profile was normal. She has had cardiac enzymes x 3, which were also normal. On admission to the hospital last night, she had a hemogram showing white count of 6.6, hemoglobin and hematocrit 14.4 and 42.7, platelet count of 230. MCV was 84. She has had 3 hemoglobins drawn since then, 12.5, 12.2, and 12.5 respectively. Urinalysis showed a trace of bacteria, trace of leukocyte esterase with 3 white cells per high-power field.   She did have a CT scan of the abdomen and pelvis in regards to the left upper quadrant pain. This showed no acute intra-abdominal findings. There was no evidence of perforation. Trace free pelvic fluid, likely physiologic, and evidence of a 3 cm uterine fibroid.   ASSESSMENT: Abdominal pain, left upper quadrant, in the setting of being post procedure (EGD and colonoscopy). CT scan shows no evidence of perforation. She did have multiple ulcers, both gastric and duodenal, on EGD. There is possibility of a mild colitis, uncertain etiology, noted also on colonic biopsies from her colonoscopy.   RECOMMENDATIONS: 1.  Will obtain an IBD panel.  2.  Continue IV PPI of 40 mg q.12 hours as you are.  3.  Pain management.  4.  Daily hemoglobins. Apparently currently her hemoglobin has been stable. She did not show any evidence of  old or fresh bleeding on rectal examination. She has had no hematemesis. Quite likely the abdominal pain is secondary to her multiple gastric ulcers. Continue current. Will follow with you.   Thank you for this consult.   ____________________________ Lollie Sails, MD mus:jcm D: 02/25/2013 16:35:48 ET T: 02/25/2013 17:58:48 ET JOB#: [id number]  cc: Lollie Sails, MD, <Dictator> Lollie Sails MD ELECTRONICALLY SIGNED 02/26/2013 2:16

## 2014-07-27 NOTE — Discharge Summary (Signed)
PATIENT NAME:  Teresa Crawford, Teresa Crawford MR#:  [id number] DATE OF BIRTH:  Mar 08, 1986  DATE OF ADMISSION:  02/27/2013 DATE OF DISCHARGE:  03/03/2013  PRIMARY CARE PHYSICIAN:  Teresa Bigness, MD  FINAL DIAGNOSES:  1.  Abdominal pain, gastric ulcers, diarrhea.  2.  Chronic fatigue.  3.  Anxiety.  4.  Mastitis.   MEDICATIONS ON DISCHARGE:  Include: 1.  Acyclovir 200 mg 5 times a day.  2.  Topamax 50 mg at bedtime.  3.  Zofran 4 mg orally disintegrating tablet every 6 hours as needed for nausea.  4.  Cephalexin 500 mg every 6 hours for 7 days.  5.  Simethicone 80 mg 4 times a day.  6.  Omeprazole 20 mg twice a day.  7.  Percocet 5/325, 1 tablet every 6 hours as needed for pain.   FOLLOWUP:   With Dr. Rayann Crawford, Gastroenterology, in one week. Follow up in 1 to 2 weeks with Dr. Clayborn Crawford.   HOSPITAL COURSE:  The patient was admitted 02/25/2013 and discharged 03/03/2013. The patient came in with epigastric abdominal pain and dark blood in the stool, was admitted to the hospital for acute epigastric discomfort and melena. GI consultation was initially done by Dr. Gustavo Crawford then Dr. Rayann Crawford followed up.   LABORATORY AND RADIOLOGICAL DATA DURING THE HOSPITAL COURSE:  Included:  A lipase of 75, BUN 97, BUN 4, creatinine 0.73, sodium 142, potassium 3.3, chloride 115, CO2 21, calcium 9.2. Liver function tests normal range. White blood cell count 6.6, H and H 14.6 and 42.7, platelet count of 230.   URINALYSIS:  Trace leukocyte esterase.   CT scan of the abdomen and pelvis with contrast:  Negative, probable 3-cm uterine fibroid.   EKG:  Sinus tachycardia, low voltage. Cardiac enzymes x 3 were negative. Urine culture less than 25,000 coag-negative staph. Helicobacter was negative. IgM, IgG, IgA and IgM. TSH 2.31. Magnesium 1.6. Occult blood was negative. Stool for C. difficile was negative.   Abdomen x-ray shows residual contrast in the colon. No bowel obstruction. Stool for Salmonella, Shigella, Campylobacter and E.  coli was negative. Abdominal ultrasound showed a simple cyst, left kidney.   White blood cell count upon discharge is 3.8, hemoglobin 12.0, platelet count of 157, glucose 114, BUN 4, creatinine 0.73, sodium 144, potassium 3.6, chloride 114, CO2 21, calcium 8.1.   HOSPITAL COURSE PER PROBLEM LIST:  1.  For abdominal pain, gastric ulcers and diarrhea, the patient continued to have symptoms during the entire hospital course. All testing was negative. I ended up getting a psychiatric consultation on the patient. It took me 4 days to discharge her home. Even on the day of discharge, she stated that she was not eating very well and was spitting up. GI thought the symptoms were psychogenic in nature. The patient did agree to discharge and will be discharged home.  2.  For her chronic fatigue, may be exaggerating the symptoms.  3.  Anxiety. I think this is playing a large role. Psychiatry did not add any new medications.  4.  Mastitis. Doxycycline that was prescribed as an outpatient may be contributing to her abdominal pain. The patient also had rib pain bilaterally. I did switch her antibiotic to cephalexin, which should be easier on the stomach. She has a followup appointment for a sonogram as an outpatient. Close clinical monitoring needed as outpatient. In the hospital, all of our studies were negative.  TIME SPENT ON DISCHARGE:  35 minutes.  ____________________________ Teresa Conch. Leslye Peer, MD rjw:jm  D: 03/03/2013 14:18:08 ET T: 03/03/2013 14:28:55 ET JOB#: [id number]  cc: Teresa Conch. Leslye Peer, MD, <Dictator> Teresa Guise, MD Teresa Brooklyn MD ELECTRONICALLY SIGNED 03/04/2013 15:18

## 2014-07-28 NOTE — Op Note (Signed)
PATIENT NAME:  Teresa Crawford, Teresa Crawford MR#:  [id number] DATE OF BIRTH:  07/26/1985  DATE OF PROCEDURE:  11/23/2013  PREOPERATIVE DIAGNOSIS: Pelvic pain unrelieved by medical management.   POSTOPERATIVE DIAGNOSES: 1.  Pelvic pain unrelieved by medical management.  2.  Endometriosis.   PROCEDURES: Operative laparoscopy, peritoneal biopsies.   ESTIMATED BLOOD LOSS: 50 mL.   FINDINGS: Normal abdomen with endometriosis out the bladder, the posterior cul-de-sac and the uterosacral ligaments.   SURGEON: Delsa Sale, MD    DESCRIPTION OF PROCEDURE: The patient was taken to the Operating Room and placed in supine position. After adequate general endotracheal anesthesia was instilled, the patient was prepped and draped in the usual sterile fashion. A side-opening speculum was placed in the patient's vagina and the anterior lip of the cervix was grasped with a single-tooth tenaculum. Hulka tenaculum was placed, side-opening speculum was removed. Attention was turned to the umbilicus which was injected with Marcaine.  An incision was made. Veress needle was placed. Hang drop test, fluid instillation test, and fluid aspiration test showed proper placement of the Veress needle. CO2 was placed on low flow. When tympany was heard around the liver, CO2 was placed on high flow. Then Veress needle was removed. The 10 mm trocar was placed.  The patient was placed in Trendelenburg and a 5 mm trocar port was placed in the midline and on the left lower quadrant.  The aforementioned findings were seen.  Photographs were taken. The areas were injected peritoneally with Marcaine to elevate and separate the tissue layers.  The tissue was cut away with the Harmonic hook.  Good hemostasis was identified.  (Dictation Anomaly) Arista was placed on all the open areas.  Good hemostasis was again identified and the uterus and ovaries allowed to fall back into the pelvis. The patient was taken out of Trendelenburg. The trocars were  removed.  CO2 was allowed to escape from the abdomen; 4-0 Monocryl was placed on the skin edges. Dermabond was placed, 2 x 2's and Tegaderm were placed.  Hulka tenaculum was removed from the patient's vagina. The patient was laid supine and taken to recovery after having tolerated the procedure well.      ____________________________ Delsa Sale, MD cck:DT D: 11/28/2013 01:60:10 ET T: 11/28/2013 19:45:55 ET JOB#: [id number]  cc: Delsa Sale, MD, <Dictator> Delsa Sale MD ELECTRONICALLY SIGNED 11/28/2013 20:35

## 2014-07-28 NOTE — Op Note (Signed)
PATIENT NAME:  Teresa Crawford, Teresa Crawford MR#:  [id number] DATE OF BIRTH:  25-Aug-1985  DATE OF PROCEDURE:  04/24/2013  PREOPERATIVE DIAGNOSES:  1.  Chronic pelvic pain.  2.  Leiomyoma.   POSTOPERATIVE DIAGNOSES:  1.  Chronic pelvic pain.  2.  Leiomyoma.  3.  Pelvic adhesive disease.  4.  Endometriosis.   OPERATIVE PROCEDURE:  Laparoscopic excision and fulguration of endometriosis.   SURGEON:  Alanda Slim. Shalon Salado, MD FIRST ASSISTANT:  Keely Reichel, PA-S  ANESTHESIA:  General endotracheal.   INDICATIONS:  The patient is a 29 year old single white female, para 0, on Depo-Provera for cycle regulation and attempt at pain control, presents for evaluation of chronic pelvic pain. Preoperative ultrasound has demonstrated uterine fibroids. No other abnormalities are known.   FINDINGS AT SURGERY:  Revealed a posterior subserosal fibroid measuring 3 cm in diameter in the lower uterine segment on the left posteriorly. The tubes and ovaries were grossly normal. There was a simple left ovarian cyst present. There were endometriosis implants in the cul-de-sac on the right side with a hemorrhagic red lesion and powder burn implants being seen. These implants were excised and cauterized. Right ovarian fossa demonstrated pseudofenestrations consistent with endometriosis. These likewise were biopsied and fulgurated. The lesions were away from the course of the ureter. There were pelvic adhesions with bowel being stuck to the pelvic sidewall in the left lower quadrant, as well as right lower quadrant. The appendix was likewise partially adherent to the pelvic sidewall. These adhesions were not taken down. Upper abdomen demonstrated normal liver and gallbladder. No upper abdominal adhesions were seen.   DESCRIPTION OF THE PROCEDURE:  The patient was brought to the operating room where she was placed in the supine position. General endotracheal anesthesia was induced without difficulty. She was placed in the dorsal  lithotomy position using the bumblebee stirrups. A Betadine and ChloraPrep abdominal, perineal, intravaginal prep and drape was performed in standard fashion. A red Robinson catheter was used to drain 30 mL of urine from the bladder. A Hulka tenaculum was placed onto the cervix. A subumbilical vertical incision 5 mm in length was made. The Optiview laparoscopic trocar system was placed with direct entry without evidence of bowel or vascular injury. A second 5-mm port was placed in the suprapubic region in the midline under direct visualization. The above-noted findings were photo documented. The endometriosis implants in the cul-de-sac were excised using biopsy forceps and cauterized using the Kleppinger bipolar forceps. In the right ovarian fossa, the pseudofenestrations were biopsied and then cauterized with Kleppinger bipolar forceps. Irrigation of the pelvis was performed. Complete survey was taken with photo documentation. Once satisfied with hemostasis and documenting of abnormalities, the procedure was terminated with all instrumentation being removed from the abdominopelvic cavity. The pneumoperitoneum was released. The incisions were closed with Dermabond glue. The patient was then awakened, extubated, and taken to the recovery room in satisfactory condition.   ESTIMATED BLOOD LOSS:  Minimal.   COMPLICATIONS:  None.   COUNTS:  All instruments, needles, and sponge counts were verified as correct.   INTRAVENOUS FLUIDS:  800 mL.   URINE OUTPUT:  30 mL.    ____________________________ Alanda Slim. Lorilynn Lehr, MD mad:ms D: 04/24/2013 14:16:35 ET T: 04/24/2013 22:30:38 ET JOB#: [id number]  cc: Hassell Done A. Oland Arquette, MD, <Dictator> [facility] Alanda Slim Yosselin Zoeller MD ELECTRONICALLY SIGNED 05/02/2013 7:38

## 2014-08-01 DIAGNOSIS — N939 Abnormal uterine and vaginal bleeding, unspecified: Secondary | ICD-10-CM | POA: Diagnosis not present

## 2014-08-01 DIAGNOSIS — N803 Endometriosis of pelvic peritoneum: Secondary | ICD-10-CM | POA: Diagnosis not present

## 2014-08-01 DIAGNOSIS — Z3202 Encounter for pregnancy test, result negative: Secondary | ICD-10-CM | POA: Diagnosis not present

## 2014-08-03 DIAGNOSIS — F79 Unspecified intellectual disabilities: Secondary | ICD-10-CM | POA: Diagnosis not present

## 2014-08-10 DIAGNOSIS — F4310 Post-traumatic stress disorder, unspecified: Secondary | ICD-10-CM | POA: Diagnosis not present

## 2014-08-10 DIAGNOSIS — F79 Unspecified intellectual disabilities: Secondary | ICD-10-CM | POA: Diagnosis not present

## 2014-08-14 DIAGNOSIS — E559 Vitamin D deficiency, unspecified: Secondary | ICD-10-CM | POA: Diagnosis not present

## 2014-08-14 DIAGNOSIS — N809 Endometriosis, unspecified: Secondary | ICD-10-CM | POA: Diagnosis not present

## 2014-08-14 DIAGNOSIS — R634 Abnormal weight loss: Secondary | ICD-10-CM | POA: Diagnosis not present

## 2014-08-14 DIAGNOSIS — F331 Major depressive disorder, recurrent, moderate: Secondary | ICD-10-CM | POA: Diagnosis not present

## 2014-08-14 DIAGNOSIS — G43009 Migraine without aura, not intractable, without status migrainosus: Secondary | ICD-10-CM | POA: Diagnosis not present

## 2014-08-14 DIAGNOSIS — R945 Abnormal results of liver function studies: Secondary | ICD-10-CM | POA: Diagnosis not present

## 2014-08-14 DIAGNOSIS — R1084 Generalized abdominal pain: Secondary | ICD-10-CM | POA: Diagnosis not present

## 2014-08-20 ENCOUNTER — Other Ambulatory Visit: Payer: Self-pay | Admitting: Nurse Practitioner

## 2014-08-20 DIAGNOSIS — R1084 Generalized abdominal pain: Secondary | ICD-10-CM

## 2014-08-24 ENCOUNTER — Ambulatory Visit
Admission: RE | Admit: 2014-08-24 | Discharge: 2014-08-24 | Disposition: A | Discharge status: Home / Self Care | Payer: Medicare Other | Source: Ambulatory Visit | Attending: Nurse Practitioner | Admitting: Nurse Practitioner

## 2014-08-24 DIAGNOSIS — K760 Fatty (change of) liver, not elsewhere classified: Secondary | ICD-10-CM | POA: Diagnosis not present

## 2014-08-24 DIAGNOSIS — R161 Splenomegaly, not elsewhere classified: Secondary | ICD-10-CM | POA: Diagnosis not present

## 2014-08-24 DIAGNOSIS — F79 Unspecified intellectual disabilities: Secondary | ICD-10-CM | POA: Diagnosis not present

## 2014-08-24 DIAGNOSIS — R1084 Generalized abdominal pain: Secondary | ICD-10-CM

## 2014-08-24 DIAGNOSIS — D259 Leiomyoma of uterus, unspecified: Secondary | ICD-10-CM | POA: Diagnosis not present

## 2014-08-24 MED ORDER — IOHEXOL 300 MG/ML  SOLN
100.0000 mL | Freq: Once | INTRAMUSCULAR | Status: AC | PRN
  Administered 2014-08-24: 100 mL via INTRAVENOUS

## 2014-09-07 DIAGNOSIS — R1084 Generalized abdominal pain: Secondary | ICD-10-CM | POA: Diagnosis not present

## 2014-09-07 DIAGNOSIS — D259 Leiomyoma of uterus, unspecified: Secondary | ICD-10-CM | POA: Diagnosis not present

## 2014-09-07 DIAGNOSIS — F331 Major depressive disorder, recurrent, moderate: Secondary | ICD-10-CM | POA: Diagnosis not present

## 2014-09-07 DIAGNOSIS — R634 Abnormal weight loss: Secondary | ICD-10-CM | POA: Diagnosis not present

## 2014-09-10 ENCOUNTER — Telehealth: Payer: Self-pay | Admitting: Gastroenterology

## 2014-09-10 NOTE — Telephone Encounter (Signed)
Needs appt. For spleen enlargement, hepatic stenosis, ab pain

## 2014-09-14 ENCOUNTER — Encounter: Payer: Self-pay | Admitting: Psychiatry

## 2014-09-14 ENCOUNTER — Ambulatory Visit (INDEPENDENT_AMBULATORY_CARE_PROVIDER_SITE_OTHER): Payer: Medicare Other | Admitting: Licensed Clinical Social Worker

## 2014-09-14 ENCOUNTER — Ambulatory Visit (INDEPENDENT_AMBULATORY_CARE_PROVIDER_SITE_OTHER): Payer: Medicare Other | Admitting: Psychiatry

## 2014-09-14 VITALS — BP 122/76 | HR 96 | Temp 97.8°F

## 2014-09-14 DIAGNOSIS — F4310 Post-traumatic stress disorder, unspecified: Secondary | ICD-10-CM

## 2014-09-14 DIAGNOSIS — F79 Unspecified intellectual disabilities: Secondary | ICD-10-CM | POA: Diagnosis not present

## 2014-09-14 DIAGNOSIS — F4321 Adjustment disorder with depressed mood: Secondary | ICD-10-CM

## 2014-09-14 DIAGNOSIS — F331 Major depressive disorder, recurrent, moderate: Secondary | ICD-10-CM

## 2014-09-14 DIAGNOSIS — M25572 Pain in left ankle and joints of left foot: Secondary | ICD-10-CM | POA: Diagnosis not present

## 2014-09-14 DIAGNOSIS — S9002XA Contusion of left ankle, initial encounter: Secondary | ICD-10-CM | POA: Diagnosis not present

## 2014-09-14 MED ORDER — MIRTAZAPINE 15 MG PO TABS: 15.0000 mg | ORAL_TABLET | Freq: Every day | ORAL | Status: DC

## 2014-09-14 MED ORDER — SERTRALINE HCL 50 MG PO TABS: 75.0000 mg | ORAL_TABLET | Freq: Every day | ORAL | Status: DC

## 2014-09-14 MED ORDER — TRAZODONE HCL 100 MG PO TABS: 100.0000 mg | ORAL_TABLET | Freq: Every day | ORAL | Status: DC
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
## 2014-09-14 NOTE — Progress Notes (Signed)
[place] MD/PA/NP OP Progress Note  09/14/2014 12:07 PM Teresa Crawford  MRN:  [id number]  Subjective:  Patient reports that her anxiety about being outside and having few years related to her past trauma has improved since her last visit. She has tolerated the increase in sertraline to 75 mg a day. She also states that she got a small dog since her last visit and she feels safe around the dog. She also attended a concert with her boyfriend and her boyfriend's family and felt safe in that arena as well. She states she's sleeping well. She states she is able to enjoy things and particularly enjoys walking her dog.  He continues to use the trazodone at night for insomnia and this has worked well. She states she is getting out more.  He has had a abdominal CT which was significant for some Hepatic steatosis. 2. Mild to moderate splenomegaly. 3. No acute inflammatory process. 4. Uterine fibroids with dominant versus 2 adjacent fibroids present. A small amount of free fluid in the pelvis is likely Physiologic.  Patient also indicates her appetite has not been good. Patient brought up perhaps restarting the Remeron as this did help her with her appetite. Chief Complaint:  not eating Visit Diagnosis:     ICD-9-CM ICD-10-CM   1. Intellectual disability 319 F79   2. Posttraumatic stress disorder 309.81 F43.10 traZODone (DESYREL) 100 MG tablet     sertraline (ZOLOFT) 50 MG tablet     mirtazapine (REMERON) 15 MG tablet  3. Major depressive disorder, recurrent episode, moderate 296.32 F33.1 traZODone (DESYREL) 100 MG tablet     sertraline (ZOLOFT) 50 MG tablet     mirtazapine (REMERON) 15 MG tablet    Past Medical History:  Past Medical History  Diagnosis Date  . Migraine   . Mental retardation   . Posttraumatic stress disorder   . Depression     Past Surgical History  Procedure Laterality Date  . Back surgery    . Laparoscopic endometriosis fulguration     Family History:  Family History   Problem Relation Age of Onset  . Breast cancer Mother   . COPD Father   . Lung cancer Father   . Dementia Father   . Diabetes Sister   . Diabetes Brother   . Ovarian cancer Paternal Aunt    Social History:  History   Social History  . Marital Status: Single    Spouse Name: N/A  . Number of Children: N/A  . Years of Education: N/A   Social History Main Topics  . Smoking status: Never Smoker   . Smokeless tobacco: Never Used  . Alcohol Use: No  . Drug Use: No  . Sexual Activity: No   Other Topics Concern  . None   Social History Narrative   Additional History:   Assessment:   Musculoskeletal: Strength & Muscle Tone: within normal limits Gait & Station: normal Patient leans: N/A  Psychiatric Specialty Exam: HPI  Review of Systems  Psychiatric/Behavioral: Negative for depression, suicidal ideas, hallucinations, memory loss and substance abuse. The patient is not nervous/anxious and does not have insomnia.     Blood pressure 122/76, pulse 96, temperature 97.8 F (36.6 C), temperature source Tympanic, last menstrual period 09/06/2014, SpO2 95 %.There is no height or weight on file to calculate BMI.  General Appearance: Fairly Groomed  Eye Contact:  Fair  Speech:  Clear and Coherent and Normal Rate more talkative than in last appointments.   Volume:  Normal  Mood:  Worthless and Good  Affect:  Congruent  Thought Process:  Concrete  Orientation:  Full (Time, Place, and Person)  Thought Content:  Negative  Suicidal Thoughts:  No  Homicidal Thoughts:  No  Memory:  Immediate;   Good Recent;   Good Remote;   Good  Judgement:  Fair  Insight:  Fair  Psychomotor Activity:  Negative  Concentration:  Good  Recall:  Good  Fund of Knowledge: Fair  Language: Fair  Akathisia:  Negative  Handed:  Right unknown  AIMS (if indicated):  Not done  Assets:  Social Support  ADL's:  Intact  Cognition: Impaired,  Moderate  Sleep:  Good    Is the patient at risk to self?   No. Has the patient been a risk to self in the past 6 months?  No. Has the patient been a risk to self within the distant past?  No. Is the patient a risk to others?  No. Has the patient been a risk to others in the past 6 months?  No. Has the patient been a risk to others within the distant past?  No.  Current Medications: Current Outpatient Prescriptions  Medication Sig Dispense Refill  . Cholecalciferol (D 1000) 1000 UNITS capsule Take by mouth.    . traMADol (ULTRAM) 50 MG tablet Take 50 mg by mouth every 6 (six) hours as needed. For pain.    . traZODone (DESYREL) 100 MG tablet Take 1 tablet (100 mg total) by mouth at bedtime. 30 tablet 2  . diphenhydrAMINE (BENADRYL) 25 MG tablet Take 25 mg by mouth every 6 (six) hours as needed. For allergies.    . hydrocortisone (CORTENEMA) 100 MG/60ML enema Place rectally.    . mirtazapine (REMERON) 15 MG tablet Take 1 tablet (15 mg total) by mouth at bedtime. 30 tablet 2  . norethindrone (MICRONOR,CAMILA,ERRIN) 0.35 MG tablet Take by mouth.    . promethazine (PHENERGAN) 12.5 MG suppository Place rectally.    . sertraline (ZOLOFT) 50 MG tablet Take 1.5 tablets (75 mg total) by mouth daily. 45 tablet 2   No current facility-administered medications for this visit.    Medical Decision Making:  Established Problem, Stable/Improving (1)  Treatment Plan Summary:Medication management we will continue her sertraline at 75 mg daily, we will continue the trazodone 100 mg at bedtime. We will restart her Remeron at 15 mg at bedtime. Patient will follow up in 2 months. She's been encouraged call if questions or concerns prior to the next appointment. She presents to this appointment with her case worker as she has for all of her past appointments.   Faith Rogue 09/14/2014, 12:07 PM

## 2014-09-20 ENCOUNTER — Encounter (INDEPENDENT_AMBULATORY_CARE_PROVIDER_SITE_OTHER): Payer: Self-pay

## 2014-09-20 ENCOUNTER — Encounter: Payer: Self-pay | Admitting: Urgent Care

## 2014-09-20 ENCOUNTER — Ambulatory Visit (INDEPENDENT_AMBULATORY_CARE_PROVIDER_SITE_OTHER): Payer: Medicare Other | Admitting: Urgent Care

## 2014-09-20 VITALS — BP 99/63 | HR 73 | Temp 97.5°F | Ht 65.0 in | Wt 166.2 lb

## 2014-09-20 DIAGNOSIS — R7989 Other specified abnormal findings of blood chemistry: Secondary | ICD-10-CM | POA: Diagnosis not present

## 2014-09-20 DIAGNOSIS — R945 Abnormal results of liver function studies: Secondary | ICD-10-CM

## 2014-09-20 DIAGNOSIS — Z8719 Personal history of other diseases of the digestive system: Secondary | ICD-10-CM

## 2014-09-20 DIAGNOSIS — K760 Fatty (change of) liver, not elsewhere classified: Secondary | ICD-10-CM | POA: Insufficient documentation

## 2014-09-20 DIAGNOSIS — R112 Nausea with vomiting, unspecified: Secondary | ICD-10-CM

## 2014-09-20 NOTE — Patient Instructions (Signed)
Fatty Liver  Fatty liver is the accumulation of fat in liver cells. It is also called hepatosteatosis or steatohepatitis. It is normal for your liver to contain some fat. If fat is more than 5 to 10% of your liver's weight, you have fatty liver.   There are often no symptoms (problems) for years while damage is still occurring. People often learn about their fatty liver when they have medical tests for other reasons. Fat can damage your liver for years or even decades without causing problems. When it becomes severe, it can cause fatigue, weight loss, weakness, and confusion.  This makes you more likely to develop more serious liver problems. The liver is the largest organ in the body. It does a lot of work and often gives no warning signs when it is sick until late in a disease.  The liver has many important jobs including:  · Breaking down foods.  · Storing vitamins, iron, and other minerals.  · Making proteins.  · Making bile for food digestion.  · Breaking down many products including medications, alcohol and some poisons.  CAUSES   There are a number of different conditions, medications, and poisons that can cause a fatty liver. Eating too many calories causes fat to build up in the liver. Not processing and breaking fats down normally may also cause this. Certain conditions, such as obesity, diabetes, and high triglycerides also cause this. Most fatty liver patients tend to be middle-aged and over weight.   Some causes of fatty liver are:  · Alcohol over consumption.  · Malnutrition.  · Steroid use.  · Valproic acid toxicity.  · Obesity.  · Cushing's syndrome.  · Poisons.  · Tetracycline in high dosages.  · Pregnancy.  · Diabetes.  · Hyperlipidemia.  · Rapid weight loss.  Some people develop fatty liver even having none of these conditions.  SYMPTOMS   Fatty liver most often causes no problems. This is called asymptomatic.  · It can be diagnosed with blood tests and also by a liver biopsy.  · It is one of the  most common causes of minor elevations of liver enzymes on routine blood tests.  · Specialized Imaging of the liver using ultrasound, CT (computed tomography) scan, or MRI (magnetic resonance imaging) can suggest a fatty liver but a biopsy is needed to confirm it.  · A biopsy involves taking a small sample of liver tissue. This is done by using a needle. It is then looked at under a microscope by a specialist.  TREATMENT   It is important to treat the cause. Simple fatty liver without a medical reason may not need treatment.  · Weight loss, fat restriction, and exercise in overweight patients produces inconsistent results but is worth trying.  · Fatty liver due to alcohol toxicity may not improve even with stopping drinking.  · Good control of diabetes may reduce fatty liver.  · Lower your triglycerides through diet, medication or both.  · Eat a balanced, healthy diet.  · Increase your physical activity.  · Get regular checkups from a liver specialist.  · There are no medical or surgical treatments for a fatty liver or NASH, but improving your diet and increasing your exercise may help prevent or reverse some of the damage.  PROGNOSIS   Fatty liver may cause no damage or it can lead to an inflammation of the liver. This is, called steatohepatitis. When it is linked to alcohol abuse, it is called alcoholic steatohepatitis. It often   is not linked to alcohol. It is then called nonalcoholic steatohepatitis, or NASH. Over time the liver may become scarred and hardened. This condition is called cirrhosis. Cirrhosis is serious and may lead to liver failure or cancer. NASH is one of the leading causes of cirrhosis. About 10-20% of Americans have fatty liver and a smaller 2-5% has NASH.  Document Released: 05/08/2005 Document Revised: 06/15/2011 Document Reviewed: 08/02/2013  [employer]® Patient Information ©2015 [employer]. This information is not intended to replace advice given to you by your health care provider. Make  sure you discuss any questions you have with your health care provider.

## 2014-09-20 NOTE — Progress Notes (Signed)
Gastroenterology Consultation  Referring Provider:     Lavera Guise, MD Primary Care Physician:  Lavera Guise, MD Primary Gastroenterologist:  Dr. Allen Norris     Reason for Consultation:     Elevated LFTS, hepatic steatosis, abdominal pain, bloating & nausea        HPI:   Teresa Crawford is a 29 y.o. y/o female referred for consultation & management of Elevated LFTS, hepatic steatosis, abdominal pain, bloating & nausea by Lavera Guise, MD.  She presents with Placido Sou from [facility], her caregiver.  She started having RUQ & lower abdominal pain for the past few months.  Pain is intermittent.  Pain is 6/10 at worst.  She has abdominal bloating that is worse when she eats.  She was found to have hepatic steatosis on CT scan & abnormal LFTS.  AST 226 & ALT 381.  ALP & bili normal.  She restarted remeron but otherwise denies any new medications.  She had poor appetite.  She has nausea & vomits a couple times per month.  She was losing weight & lost 30# in 5 months.  Nothing makes the pain better or worse. She does not have any tattoos or hx transfusions.  She has never had sexual intercourse.  She denes hx street drugs.  She denies ETOH.  She lives in her own apartment.  She has GYN FU for uterine fibroids.  Uses Boost.  Denies excessive tylenol/acetaminophen or NSAIDS.  She was on lupron injections earlier this year but cannot remember when her last injection was given. She had a GI workup by Joette Catching to clinic back in 2014 with colonoscopy and EGD as below. She has history of multiple peptic and duodenal ulcers as well as mild colitis on at least 2 previous colonoscopies. She had IBD panel done through [facility]. I do not have the results.    Past Medical History  Diagnosis Date  . Migraine   . Mental retardation   . Posttraumatic stress disorder   . Depression   . Hepatic steatosis   . Splenomegaly   . Elevated liver enzymes   . Uterine fibroid   . Endometriosis   .  Chronic back pain   . Hypothyroid   . Chronic fatigue syndrome   . Scoliosis   . PUD (peptic ulcer disease)     Past Surgical History  Procedure Laterality Date  . Back surgery    . Laparoscopic endometriosis fulguration  2015    [name]  . Colonoscopy  04/27/2006    Wohl-colitis, internal hemorrhoids  . Esophagogastroduodenoscopy  02/2013    multiple peptic & duodenal ulcers, negative h pylori  . Colonoscopy  02/2013    focal right colitis, left colon biopsy negative, focal active proctitis without chronicity   Current Outpatient Prescriptions  Medication Sig Dispense Refill  . Cholecalciferol (D 1000) 1000 UNITS capsule Take by mouth.    . mirtazapine (REMERON) 15 MG tablet Take 1 tablet (15 mg total) by mouth at bedtime. 30 tablet 2  . sertraline (ZOLOFT) 50 MG tablet Take 1.5 tablets (75 mg total) by mouth daily. 45 tablet 2  . traMADol (ULTRAM) 50 MG tablet Take 50 mg by mouth every 6 (six) hours as needed. For pain.    . traZODone (DESYREL) 100 MG tablet Take 1 tablet (100 mg total) by mouth at bedtime. 30 tablet 2   No current facility-administered medications for this visit.    Family History  Problem Relation Age  of Onset  . Breast cancer Mother   . COPD Father   . Lung cancer Father   . Dementia Father   . Diabetes Sister   . Diabetes Brother   . Ovarian cancer Paternal Aunt   . Colon cancer Maternal Grandmother   . Liver disease Maternal Aunt     ?etiology     History   Social History Narrative   She has brother, half brother & sister live nearby   Both parents deceased   Care managed by [name]   History  Substance Use Topics  . Smoking status: Never Smoker   . Smokeless tobacco: Never Used  . Alcohol Use: No    Allergies as of 09/20/2014 - Review Complete 09/20/2014  Allergen Reaction Noted  . Peanut butter flavor Swelling 08/24/2014  . Codeine Nausea And Vomiting 12/10/2011  . Paxil [paroxetine hcl] Other (See Comments) 12/10/2011  .  Zoloft [sertraline hcl] Other (See Comments) 12/10/2011    Review of Systems:    All systems reviewed and negative except where noted in HPI.   Physical Exam:  BP 99/63 mmHg  Pulse 73  Temp(Src) 97.5 F (36.4 C) (Oral)  Ht 5\' 5"  (1.651 m)  Wt 166 lb 3.2 oz (75.388 kg)  BMI 27.66 kg/m2  LMP 09/06/2014 Patient's last menstrual period was 09/06/2014. General:   Alert,  Well-developed, well-nourished, pleasant and cooperative in NAD, accompanied by her caregiver Head:  Normocephalic and atraumatic. Eyes:  Sclera clear, no icterus.   Conjunctiva pink. Ears:  Normal auditory acuity. Nose:  No deformity, discharge, or lesions. Mouth:  No deformity or lesions,oropharynx pink & moist. Neck:  Supple; no masses or thyromegaly. Lungs:  Respirations even and unlabored.  Clear throughout to auscultation.   No wheezes, crackles, or rhonchi. No acute distress. Heart:  Regular rate and rhythm; no murmurs, clicks, rubs, or gallops. Abdomen:  Normal bowel sounds.  No bruits.  Soft, non-tender and non-distended without masses, hepatosplenomegaly or hernias noted.  No guarding or rebound tenderness.  Negative Carnett sign.   Rectal:  Deferred.  Msk:  Symmetrical without gross deformities.  Good, equal movement & strength bilaterally. Pulses:  Normal pulses noted. Extremities:  No clubbing or edema.  No cyanosis. Neurologic:  Alert and oriented x3;  grossly normal neurologically. Skin:  Intact without significant lesions or rashes.  No jaundice. Lymph Nodes:  No significant cervical adenopathy. Psych:  Alert and cooperative. Normal mood and affect.  Imaging Studies: Ct Abdomen Pelvis W Contrast  08/24/2014   CLINICAL DATA:  Right upper quadrant abdominal pain for 6 months with 20 pound weight loss elevated liver function tests. Previous demonstration by pelvic ultrasound of uterine fibroids.  EXAM: CT ABDOMEN AND PELVIS WITH CONTRAST  TECHNIQUE: Multidetector CT imaging of the abdomen and pelvis was  performed using the standard protocol following bolus administration of intravenous contrast.  CONTRAST:  140mL OMNIPAQUE IOHEXOL 300 MG/ML  SOLN  COMPARISON:  Pelvic ultrasound on 07/07/2014 CT of the abdomen and pelvis without contrast on 11/30/2013  FINDINGS: The liver shows mild steatosis as well as focal fat adjacent to the falciform ligament. No evidence of biliary ductal dilatation or hepatic mass. The gallbladder is unremarkable by ultrasound.  The spleen is mild to moderately enlarged with estimated volume of 600 mL. Splenic vein and portal vein are patent. The pancreas, adrenal glands and kidneys are within normal limits.  Bowel shows no evidence of obstruction or inflammation. No intra abdominal mass or lymphadenopathy is identified. No evidence  of abscess. No hernias are seen.  The uterus demonstrates single dominant versus 2 adjacent left fundal fibroids measuring in conglomerate dimensions approximately 4.1 x 6.0 x 4.1 cm. A small amount of free fluid is present in the cul-de-sac which is likely physiologic. The bladder is decompressed.  Bony structures show underlying thoracolumbar scoliosis with prior spinal rod fixation. Visualized lung bases are unremarkable.  IMPRESSION: 1. Hepatic steatosis. 2. Mild to moderate splenomegaly. 3. No acute inflammatory process. 4. Uterine fibroids with dominant versus 2 adjacent fibroids present. A small amount of free fluid in the pelvis is likely physiologic.   Electronically Signed   By: Aletta Edouard M.D.   On: 08/24/2014 10:10

## 2014-09-21 ENCOUNTER — Ambulatory Visit: Payer: Medicare Other | Admitting: Licensed Clinical Social Worker

## 2014-09-21 DIAGNOSIS — R7989 Other specified abnormal findings of blood chemistry: Secondary | ICD-10-CM | POA: Diagnosis not present

## 2014-09-21 DIAGNOSIS — K760 Fatty (change of) liver, not elsewhere classified: Secondary | ICD-10-CM | POA: Diagnosis not present

## 2014-09-21 LAB — CBC WITH DIFFERENTIAL/PLATELET
BASOS ABS: 0 10*3/uL (ref 0.0–0.2)
Basos: 0 %
EOS (ABSOLUTE): 0.1 10*3/uL (ref 0.0–0.4)
Eos: 2 %
Hematocrit: 37.1 % (ref 34.0–46.6)
Hemoglobin: 12.6 g/dL (ref 11.1–15.9)
IMMATURE GRANULOCYTES: 0 %
Immature Grans (Abs): 0 10*3/uL (ref 0.0–0.1)
LYMPHS ABS: 1.2 10*3/uL (ref 0.7–3.1)
LYMPHS: 28 %
MCH: 29.2 pg (ref 26.6–33.0)
MCHC: 34 g/dL (ref 31.5–35.7)
MCV: 86 fL (ref 79–97)
MONOCYTES: 6 %
Monocytes Absolute: 0.2 10*3/uL (ref 0.1–0.9)
NEUTROS PCT: 64 %
Neutrophils Absolute: 2.7 10*3/uL (ref 1.4–7.0)
PLATELETS: 182 10*3/uL (ref 150–379)
RBC: 4.32 x10E6/uL (ref 3.77–5.28)
RDW: 15 % (ref 12.3–15.4)
WBC: 4.2 10*3/uL (ref 3.4–10.8)

## 2014-09-21 LAB — PROTIME-INR
INR: 0.9 (ref 0.8–1.2)
PROTHROMBIN TIME: 9.8 s (ref 9.1–12.0)

## 2014-09-22 ENCOUNTER — Encounter: Payer: Self-pay | Admitting: Urgent Care

## 2014-09-22 ENCOUNTER — Telehealth: Payer: Self-pay | Admitting: Urgent Care

## 2014-09-22 DIAGNOSIS — Z8719 Personal history of other diseases of the digestive system: Secondary | ICD-10-CM | POA: Insufficient documentation

## 2014-09-22 DIAGNOSIS — R112 Nausea with vomiting, unspecified: Secondary | ICD-10-CM | POA: Insufficient documentation

## 2014-09-22 LAB — HEPATIC FUNCTION PANEL
ALK PHOS: 103 IU/L (ref 39–117)
ALT: 12 IU/L (ref 0–32)
AST: 8 IU/L (ref 0–40)
Albumin: 3.7 g/dL (ref 3.5–5.5)
BILIRUBIN, DIRECT: 0.05 mg/dL (ref 0.00–0.40)
Bilirubin Total: <0.2 mg/dL (ref 0.0–1.2)
Total Protein: 6.1 g/dL (ref 6.0–8.5)

## 2014-09-22 LAB — HEPATITIS B SURFACE ANTIGEN: HEP B S AG: NEGATIVE

## 2014-09-22 LAB — TSH: TSH: 1.63 u[IU]/mL (ref 0.450–4.500)

## 2014-09-22 LAB — IGG, IGA, IGM
IGM (IMMUNOGLOBULIN M), SRM: 275 mg/dL — AB (ref 26–217)
IgA/Immunoglobulin A, Serum: 132 mg/dL (ref 87–352)
IgG (Immunoglobin G), Serum: 721 mg/dL (ref 700–1600)

## 2014-09-22 LAB — HEPATITIS A ANTIBODY, TOTAL: HEP A TOTAL AB: NEGATIVE

## 2014-09-22 LAB — HEPATITIS B SURFACE ANTIBODY,QUALITATIVE: Hep B Surface Ab, Qual: REACTIVE

## 2014-09-22 LAB — ANA: Anti Nuclear Antibody(ANA): NEGATIVE

## 2014-09-22 LAB — FERRITIN: FERRITIN: 79 ng/mL (ref 15–150)

## 2014-09-22 LAB — CERULOPLASMIN: CERULOPLASMIN: 61.6 mg/dL — AB (ref 19.0–39.0)

## 2014-09-22 LAB — ALPHA-1-ANTITRYPSIN: A-1 Antitrypsin: 237 mg/dL — ABNORMAL HIGH (ref 90–200)

## 2014-09-22 LAB — IRON AND TIBC
Iron Saturation: 30 % (ref 15–55)
Iron: 82 ug/dL (ref 27–159)
Total Iron Binding Capacity: 272 ug/dL (ref 250–450)
UIBC: 190 ug/dL (ref 131–425)

## 2014-09-22 LAB — HEPATITIS C ANTIBODY: HEP C VIRUS AB: 0.2 {s_co_ratio} (ref 0.0–0.9)

## 2014-09-22 NOTE — Assessment & Plan Note (Signed)
Teresa Crawford is a very pleasant 29 y.o. female with significantly elevated liver function tests (AST/ALT)  most likely secondary to medication effect from Lupron and possibly underlying NASH.   Further labs necessary to look for viral hepatitis, autoimmune liver disease, Primary Biliary cirrhosis, Primary Sclerosing Cholangitis, Hemachromatosis, Wilson's disease, or A-1 antitrypsin deficiency.  She has hepatic steatosis on CT.  Will check Hepatitis A/B vaccine status

## 2014-09-22 NOTE — Assessment & Plan Note (Signed)
Colitis on two previous colonoscopies is suspicious for IBD.  Will request IBD panel from [facility].  Consider UGI with SBFT.

## 2014-09-22 NOTE — Telephone Encounter (Signed)
Please request IBD panel results on this patient from [facility]. Thanks

## 2014-09-22 NOTE — Assessment & Plan Note (Addendum)
She has history of multiple peptic and duodenal ulcers. No recent NSAIDs. Negative H. pylori previously.  Will add PPI & consider followup Upper GI with SBFT once labs returned.

## 2014-09-22 NOTE — Assessment & Plan Note (Signed)
- 

## 2014-09-23 LAB — MITOCHONDRIAL/SMOOTH MUSCLE AB PNL
Mitochondrial Ab: 17.5 Units (ref 0.0–20.0)
Smooth Muscle Ab: 7 Units (ref 0–19)

## 2014-09-24 DIAGNOSIS — N803 Endometriosis of pelvic peritoneum: Secondary | ICD-10-CM | POA: Diagnosis not present

## 2014-09-24 DIAGNOSIS — D251 Intramural leiomyoma of uterus: Secondary | ICD-10-CM | POA: Diagnosis not present

## 2014-09-24 DIAGNOSIS — N939 Abnormal uterine and vaginal bleeding, unspecified: Secondary | ICD-10-CM | POA: Diagnosis not present

## 2014-09-25 ENCOUNTER — Encounter: Payer: Self-pay | Admitting: Urgent Care

## 2014-09-25 LAB — ACETAMINOPHEN LEVEL: ACETAMINOPHEN (TYLENOL), SERUM: NEGATIVE ug/mL (ref 10–30)

## 2014-09-25 NOTE — Telephone Encounter (Signed)
noted 

## 2014-09-25 NOTE — Telephone Encounter (Signed)
Called [facility] and asked for patient's IBD Panel and was told that patient has not been to the office for a year plus. I was also told that she didn't have any type of blood work.

## 2014-09-28 ENCOUNTER — Ambulatory Visit (INDEPENDENT_AMBULATORY_CARE_PROVIDER_SITE_OTHER): Payer: Medicare Other | Admitting: Licensed Clinical Social Worker

## 2014-09-28 ENCOUNTER — Other Ambulatory Visit: Payer: Self-pay

## 2014-09-28 DIAGNOSIS — F339 Major depressive disorder, recurrent, unspecified: Secondary | ICD-10-CM | POA: Insufficient documentation

## 2014-09-28 DIAGNOSIS — F331 Major depressive disorder, recurrent, moderate: Secondary | ICD-10-CM | POA: Diagnosis not present

## 2014-09-28 DIAGNOSIS — R5382 Chronic fatigue, unspecified: Secondary | ICD-10-CM

## 2014-09-28 DIAGNOSIS — F79 Unspecified intellectual disabilities: Secondary | ICD-10-CM | POA: Insufficient documentation

## 2014-09-28 DIAGNOSIS — F4310 Post-traumatic stress disorder, unspecified: Secondary | ICD-10-CM | POA: Diagnosis not present

## 2014-09-28 DIAGNOSIS — F4321 Adjustment disorder with depressed mood: Secondary | ICD-10-CM | POA: Diagnosis not present

## 2014-09-28 DIAGNOSIS — G9332 Myalgic encephalomyelitis/chronic fatigue syndrome: Secondary | ICD-10-CM | POA: Insufficient documentation

## 2014-09-28 DIAGNOSIS — D8989 Other specified disorders involving the immune mechanism, not elsewhere classified: Secondary | ICD-10-CM | POA: Insufficient documentation
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
## 2014-10-05 ENCOUNTER — Ambulatory Visit (INDEPENDENT_AMBULATORY_CARE_PROVIDER_SITE_OTHER): Payer: Medicare Other | Admitting: Urgent Care

## 2014-10-05 ENCOUNTER — Encounter: Payer: Self-pay | Admitting: Urgent Care

## 2014-10-05 ENCOUNTER — Telehealth: Payer: Self-pay | Admitting: Urgent Care

## 2014-10-05 VITALS — BP 104/70 | HR 78 | Temp 98.3°F | Ht 65.0 in | Wt 172.0 lb

## 2014-10-05 DIAGNOSIS — K760 Fatty (change of) liver, not elsewhere classified: Secondary | ICD-10-CM

## 2014-10-05 NOTE — Assessment & Plan Note (Signed)
Elevated liver function tests (AST/ALT) most likely secondary to medication effect from Lupron and possibly underlying NASH. LFTS are now normal Call if any further problems Instructions for fatty liver: Recommend 1-2# weight loss per week until ideal body weight through exercise & diet. Low fat/cholesterol diet.   Avoid sweets, sodas, fruit juices, sweetened beverages like tea, etc. Gradually increase exercise from 15 min daily up to 1 hr per day 5 days/week. Limit alcohol use. Will need Hepatitis A vaccine

## 2014-10-05 NOTE — Patient Instructions (Signed)
Your liver function tests are all normal now Avoid LUPRON Instructions for fatty liver: Recommend 1-2# weight loss per week until ideal body weight through exercise & diet. Low fat/cholesterol diet.   Avoid sweets, sodas, fruit juices, sweetened beverages like tea, etc. Gradually increase exercise from 15 min daily up to 1 hr per day 5 days/week. Limit alcohol use. Call if elevated liver tests in the future or any further problems Follow up with your primary care provider

## 2014-10-05 NOTE — Telephone Encounter (Signed)
Please let Dr Humphrey Rolls know patient should have Hepatitis A vaccine. Thanks

## 2014-10-05 NOTE — Progress Notes (Signed)
Primary Care Physician: Lavera Guise, MD Primary Gastroenterologist:  Dr Allen Norris  Chief Complaint  Patient presents with  . Follow-up    Elevated LFT's    HPI: Teresa Crawford is a 29 y.o. female here for follow up of Elevated LFTS, hepatic steatosis, abdominal pain, bloating & nausea by Lavera Guise, MD. She presents with caregiver from [place].  She feels well.  Serologic workup for liver disease benign.  Denies heartburn, indigestion, nausea, vomiting, dysphagia, odynophagia or anorexia. Denies constipation, diarrhea, rectal bleeding, melena or weight loss.  LFTS are now normal.  Hepatic Function Latest Ref Rng 09/21/2014 07/07/2014 07/14/2013  Total Protein 6.0 - 8.5 g/dL 6.1 7.8 7.1  Albumin - - 4.3 3.5  AST 0 - 40 IU/L 8 226(H) 46(H)  ALT 0 - 32 IU/L 12 381(H) 55  Alk Phosphatase 39 - 117 IU/L 103 120 161(H)  Total Bilirubin 0.0 - 1.2 mg/dL <0.2 - -  Bilirubin, Direct 0.00 - 0.40 mg/dL 0.05 - -      Current Outpatient Prescriptions  Medication Sig Dispense Refill  . Butalbital-APAP-Caffeine 50-300-40 MG CAPS Take by mouth.    . Cholecalciferol (D 1000) 1000 UNITS capsule Take by mouth.    . docusate sodium (COLACE) 100 MG capsule Take by mouth.    . EPINEPHrine (EPIPEN 2-PAK) 0.3 mg/0.3 mL IJ SOAJ injection Inject into the muscle.    . hydrOXYzine (VISTARIL) 50 MG capsule Take by mouth.    Marland Kitchen leuprolide (LUPRON DEPOT) 11.25 MG injection Inject into the muscle.    . mirtazapine (REMERON) 15 MG tablet Take 1 tablet (15 mg total) by mouth at bedtime. 30 tablet 2  . Norethin-Eth Estradiol-Fe [facility] FE,WYMZYA Orrin Brigham) 0.4-35 MG-MCG tablet Chew by mouth.    Marland Kitchen omeprazole (PRILOSEC) 20 MG capsule Take by mouth.    . promethazine (PHENERGAN) 12.5 MG tablet Take by mouth.    . sertraline (ZOLOFT) 50 MG tablet Take 1.5 tablets (75 mg total) by mouth daily. 45 tablet 2  . SUMAtriptan (IMITREX) 50 MG tablet Take by mouth.    . topiramate (TOPAMAX) 50 MG  tablet Take by mouth.    . traMADol (ULTRAM) 50 MG tablet Take 50 mg by mouth every 6 (six) hours as needed. For pain.    . traZODone (DESYREL) 100 MG tablet Take 1 tablet (100 mg total) by mouth at bedtime. 30 tablet 2  . Vitamin D, Ergocalciferol, (DRISDOL) 50000 UNITS CAPS capsule Take by mouth.     No current facility-administered medications for this visit.    Allergies as of 10/05/2014 - Review Complete 10/05/2014  Allergen Reaction Noted  . Peanut butter flavor Swelling 08/24/2014  . Codeine Nausea And Vomiting 12/10/2011  . Paroxetine  09/28/2014  . Paxil [paroxetine hcl] Other (See Comments) 12/10/2011  . Venlafaxine  09/28/2014  . Zoloft [sertraline hcl] Other (See Comments) 12/10/2011    Review of Systems: Gen: Denies any fever, chills, fatigue, weakness, malaise ENT: Negative for hoarseness, difficulty swallowing , nasal congestion CV: Denies chest pain, angina, palpitations, syncope, orthopnea, PND, peripheral edema, and claudication. Resp: Denies dyspnea at rest, dyspnea with exercise, cough, sputum, wheezing, coughing up blood, and pleurisy. GI: See HPI GU:  Negative for dysuria, hematuria, urinary incontinence, urinary frequency, nocturnal urination.  Endo: Negative for unusual weight change or sweats Derm: Denies jaundice, rash, itching, or unhealing ulcers.  Psych: Denies depression, anxiety, memory loss, suicidal ideation, hallucinations, paranoia, and confusion. Heme: Denies bruising, bleeding, and enlarged lymph nodes.  Physical Examination:  LMP 09/06/2014 There is no weight on file to calculate BMI. Patient's last menstrual period was 09/06/2014. General:   Alert,  Well-developed, well-nourished, pleasant and cooperative in NAD Head:  Normocephalic and atraumatic. Eyes:  Sclera clear, no icterus.   Conjunctiva pink. Mouth:  No deformity or lesions.  Oropharynx pink & moist. Neck:  Supple; no masses or thyromegaly. Heart:  Regular rate and rhythm; no  murmurs, clicks, rubs,  or gallops. Abdomen:   Normal bowel sounds.  Soft, nontender and nondistended. No masses, hepatosplenomegaly or hernias noted. No guarding or rebound tenderness.   Msk:  Symmetrical without gross deformities. Normal posture. Pulses:  Normal pulses noted. Extremities:  Without clubbing or edema. Neurologic:  Alert and  oriented x3;  grossly normal neurologically. Skin:  Intact without significant lesions or rashes. Cervical Nodes:  No significant cervical adenopathy. Psych:  Alert and cooperative. Normal mood and affect.

## 2014-10-10 NOTE — Telephone Encounter (Signed)
Left VM for nursing staff stating that patient will need Hepatitis A Vaccine series.

## 2014-10-12 ENCOUNTER — Encounter: Payer: Self-pay | Admitting: Emergency Medicine

## 2014-10-12 ENCOUNTER — Ambulatory Visit (INDEPENDENT_AMBULATORY_CARE_PROVIDER_SITE_OTHER): Payer: Medicare Other | Admitting: Licensed Clinical Social Worker

## 2014-10-12 ENCOUNTER — Emergency Department
Admission: EM | Admit: 2014-10-12 | Discharge: 2014-10-12 | Disposition: A | Discharge status: Home / Self Care | Payer: Medicare Other | Attending: Emergency Medicine | Admitting: Emergency Medicine

## 2014-10-12 DIAGNOSIS — R1030 Lower abdominal pain, unspecified: Secondary | ICD-10-CM | POA: Diagnosis present

## 2014-10-12 DIAGNOSIS — F4321 Adjustment disorder with depressed mood: Secondary | ICD-10-CM

## 2014-10-12 DIAGNOSIS — Z79899 Other long term (current) drug therapy: Secondary | ICD-10-CM | POA: Insufficient documentation

## 2014-10-12 DIAGNOSIS — Z3202 Encounter for pregnancy test, result negative: Secondary | ICD-10-CM | POA: Diagnosis not present

## 2014-10-12 DIAGNOSIS — G8929 Other chronic pain: Secondary | ICD-10-CM | POA: Insufficient documentation

## 2014-10-12 DIAGNOSIS — F419 Anxiety disorder, unspecified: Secondary | ICD-10-CM | POA: Diagnosis not present

## 2014-10-12 DIAGNOSIS — F331 Major depressive disorder, recurrent, moderate: Secondary | ICD-10-CM

## 2014-10-12 DIAGNOSIS — N809 Endometriosis, unspecified: Secondary | ICD-10-CM | POA: Diagnosis not present

## 2014-10-12 DIAGNOSIS — F4310 Post-traumatic stress disorder, unspecified: Secondary | ICD-10-CM | POA: Diagnosis not present

## 2014-10-12 DIAGNOSIS — Z791 Long term (current) use of non-steroidal anti-inflammatories (NSAID): Secondary | ICD-10-CM | POA: Diagnosis not present

## 2014-10-12 LAB — COMPREHENSIVE METABOLIC PANEL
ALBUMIN: 3.5 g/dL (ref 3.5–5.0)
ALT: 9 U/L — ABNORMAL LOW (ref 14–54)
AST: 12 U/L — ABNORMAL LOW (ref 15–41)
Alkaline Phosphatase: 89 U/L (ref 38–126)
Anion gap: 6 (ref 5–15)
BILIRUBIN TOTAL: 0.2 mg/dL — AB (ref 0.3–1.2)
BUN: 8 mg/dL (ref 6–20)
CO2: 22 mmol/L (ref 22–32)
Calcium: 8.5 mg/dL — ABNORMAL LOW (ref 8.9–10.3)
Chloride: 112 mmol/L — ABNORMAL HIGH (ref 101–111)
Creatinine, Ser: 0.69 mg/dL (ref 0.44–1.00)
GFR calc Af Amer: >60 mL/min (ref >60)
GLUCOSE: 98 mg/dL (ref 65–99)
Potassium: 3.8 mmol/L (ref 3.5–5.1)
SODIUM: 140 mmol/L (ref 135–145)
TOTAL PROTEIN: 7 g/dL (ref 6.5–8.1)

## 2014-10-12 LAB — CBC WITH DIFFERENTIAL/PLATELET
BASOS ABS: 0 10*3/uL (ref 0–0.1)
BASOS PCT: 0 %
EOS PCT: 1 %
Eosinophils Absolute: 0.1 10*3/uL (ref 0–0.7)
HCT: 37.1 % (ref 35.0–47.0)
HEMOGLOBIN: 12.2 g/dL (ref 12.0–16.0)
LYMPHS ABS: 1.6 10*3/uL (ref 1.0–3.6)
Lymphocytes Relative: 20 %
MCH: 28.9 pg (ref 26.0–34.0)
MCHC: 33 g/dL (ref 32.0–36.0)
MCV: 87.6 fL (ref 80.0–100.0)
MONO ABS: 0.3 10*3/uL (ref 0.2–0.9)
MONOS PCT: 4 %
Neutro Abs: 6 10*3/uL (ref 1.4–6.5)
Neutrophils Relative %: 75 %
Platelets: 197 10*3/uL (ref 150–440)
RBC: 4.23 MIL/uL (ref 3.80–5.20)
RDW: 14.5 % (ref 11.5–14.5)
WBC: 8.1 10*3/uL (ref 3.6–11.0)

## 2014-10-12 LAB — URINALYSIS COMPLETE WITH MICROSCOPIC (ARMC ONLY)
BILIRUBIN URINE: NEGATIVE
Glucose, UA: NEGATIVE mg/dL
HGB URINE DIPSTICK: NEGATIVE
KETONES UR: NEGATIVE mg/dL
Nitrite: NEGATIVE
PH: 6 (ref 5.0–8.0)
Protein, ur: NEGATIVE mg/dL
Specific Gravity, Urine: 1.019 (ref 1.005–1.030)

## 2014-10-12 LAB — POCT PREGNANCY, URINE: Preg Test, Ur: NEGATIVE

## 2014-10-12 LAB — LIPASE, BLOOD: Lipase: 31 U/L (ref 22–51)

## 2014-10-12 MED ORDER — KETOROLAC TROMETHAMINE 30 MG/ML IJ SOLN
30.0000 mg | Freq: Once | INTRAMUSCULAR | Status: AC
  Administered 2014-10-12: 30 mg via INTRAMUSCULAR

## 2014-10-12 MED ORDER — KETOROLAC TROMETHAMINE 30 MG/ML IJ SOLN
INTRAMUSCULAR | Status: AC
  Filled 2014-10-12: qty 1

## 2014-10-12 MED ORDER — NAPROXEN 500 MG PO TABS: 500.0000 mg | ORAL_TABLET | Freq: Two times a day (BID) | ORAL | Status: DC
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
## 2014-10-12 NOTE — ED Notes (Signed)
Pt reports hx of endometriosis, reports lower abdominal pain x3 days. Pt denies any vaginal bleeding, discharge. Pt ambulatory to triage.

## 2014-10-12 NOTE — ED Provider Notes (Signed)
[facility] Emergency Department Provider Note  ____________________________________________  Time seen: 6:30 PM  I have reviewed the triage vital signs and the nursing notes.   HISTORY  Chief Complaint Abdominal Pain   HPI Teresa Crawford is a 29 y.o. female who presents with complaints of lower abdominal pain for 3 days. She reports this is consistent with her endometriosis. She denies vaginal bleeding or discharge. She denies dysuria. She denies injury. She has no fevers chills. No nausea vomiting.     Past Medical History  Diagnosis Date  . Migraine   . Mental retardation   . Posttraumatic stress disorder   . Depression   . Hepatic steatosis   . Splenomegaly   . Elevated liver enzymes   . Uterine fibroid   . Endometriosis   . Chronic back pain   . Hypothyroid   . Chronic fatigue syndrome   . Scoliosis   . PUD (peptic ulcer disease)     Patient Active Problem List   Diagnosis Date Noted  . CFIDS (chronic fatigue and immune dysfunction syndrome) 09/28/2014  . Aggrieved 09/28/2014  . Depression, major, recurrent 09/28/2014  . Intellectual disability 09/28/2014  . Neurosis, posttraumatic 09/28/2014  . History of colitis 09/22/2014  . Hepatic steatosis 09/20/2014  . Endometriosis 08/30/2013    Past Surgical History  Procedure Laterality Date  . Back surgery    . Laparoscopic endometriosis fulguration  2015    [name]  . Colonoscopy  04/27/2006    Wohl-colitis, internal hemorrhoids  . Esophagogastroduodenoscopy  02/2013    multiple peptic & duodenal ulcers, negative h pylori  . Colonoscopy  02/2013    focal right colitis, left colon biopsy negative, focal active proctitis without chronicity    Current Outpatient Rx  Name  Route  Sig  Dispense  Refill  . Butalbital-APAP-Caffeine 50-300-40 MG CAPS   Oral   Take by mouth.         . Cholecalciferol (D 1000) 1000 UNITS capsule   Oral   Take by mouth.         . docusate sodium  (COLACE) 100 MG capsule   Oral   Take by mouth.         . EPINEPHrine (EPIPEN 2-PAK) 0.3 mg/0.3 mL IJ SOAJ injection   Intramuscular   Inject into the muscle.         . hydrOXYzine (VISTARIL) 50 MG capsule   Oral   Take by mouth.         Marland Kitchen leuprolide (LUPRON DEPOT) 11.25 MG injection   Intramuscular   Inject into the muscle.         . mirtazapine (REMERON) 15 MG tablet   Oral   Take 1 tablet (15 mg total) by mouth at bedtime.   30 tablet   2   . naproxen (NAPROSYN) 500 MG tablet   Oral   Take 1 tablet (500 mg total) by mouth 2 (two) times daily with a meal.   20 tablet   2   . Norethin-Eth Estradiol-Fe [facility] FE,WYMZYA Orrin Brigham) 0.4-35 MG-MCG tablet   Oral   Chew by mouth.         Marland Kitchen omeprazole (PRILOSEC) 20 MG capsule   Oral   Take by mouth.         . promethazine (PHENERGAN) 12.5 MG tablet   Oral   Take by mouth.         . sertraline (ZOLOFT) 50 MG tablet   Oral  Take 1.5 tablets (75 mg total) by mouth daily.   45 tablet   2   . SUMAtriptan (IMITREX) 50 MG tablet   Oral   Take by mouth.         . topiramate (TOPAMAX) 50 MG tablet   Oral   Take by mouth.         . traMADol (ULTRAM) 50 MG tablet   Oral   Take 50 mg by mouth every 6 (six) hours as needed. For pain.         . traZODone (DESYREL) 100 MG tablet   Oral   Take 1 tablet (100 mg total) by mouth at bedtime.   30 tablet   2   . Vitamin D, Ergocalciferol, (DRISDOL) 50000 UNITS CAPS capsule   Oral   Take by mouth.           Allergies Peanut butter flavor; Codeine; Paroxetine; Paxil; Venlafaxine; and Zoloft  Family History  Problem Relation Age of Onset  . Breast cancer Mother   . COPD Father   . Lung cancer Father   . Dementia Father   . Diabetes Sister   . Diabetes Brother   . Ovarian cancer Paternal Aunt   . Colon cancer Maternal Grandmother   . Liver disease Maternal Aunt     ?etiology    Social History History  Substance Use Topics  .  Smoking status: Never Smoker   . Smokeless tobacco: Never Used  . Alcohol Use: No    Review of Systems  Constitutional: Negative for fever. Eyes: Negative for visual changes. ENT: Negative for sore throat Cardiovascular: Negative for chest pain. Respiratory: Negative for shortness of breath. Gastrointestinal: Negative for abdominal pain, vomiting and diarrhea. Genitourinary: Negative for dysuria. Positive pelvic discomfort, no vaginal discharge Musculoskeletal: Negative for back pain. Skin: Negative for rash. Neurological: Negative for headaches or focal weakness Psychiatric: Positive anxiety  10-point ROS otherwise negative.  ____________________________________________   PHYSICAL EXAM:  VITAL SIGNS: ED Triage Vitals  Enc Vitals Group     BP 10/12/14 1502 104/66 mmHg     Pulse Rate 10/12/14 1502 72     Resp 10/12/14 1502 19     Temp 10/12/14 1502 98.1 F (36.7 C)     Temp Source 10/12/14 1502 Oral     SpO2 10/12/14 1502 100 %     Weight 10/12/14 1502 170 lb (77.111 kg)     Height 10/12/14 1502 5\' 5"  (1.651 m)     Head Cir --      Peak Flow --      Pain Score 10/12/14 1504 10     Pain Loc --      Pain Edu? --      Excl. in [place]? --      Constitutional: Alert and oriented. Well appearing and in no distress. Eyes: Conjunctivae are normal.  ENT   Head: Normocephalic and atraumatic.   Mouth/Throat: Mucous membranes are moist. Cardiovascular: Normal rate, regular rhythm. Normal and symmetric distal pulses are present in all extremities. No murmurs, rubs, or gallops. Respiratory: Normal respiratory effort without tachypnea nor retractions. Breath sounds are clear and equal bilaterally.  Gastrointestinal: Soft and non-tender in all quadrants. No distention. There is no CVA tenderness. Genitourinary: deferred per patient request Musculoskeletal: Nontender with normal range of motion in all extremities. No lower extremity tenderness nor edema. Neurologic:  Normal  speech and language. No gross focal neurologic deficits are appreciated. Skin:  Skin is warm, dry and intact. No  rash noted. Psychiatric: Mood and affect are normal. Patient exhibits appropriate insight and judgment.  ____________________________________________    LABS (pertinent positives/negatives)  Labs Reviewed  COMPREHENSIVE METABOLIC PANEL - Abnormal; Notable for the following:    Chloride 112 (*)    Calcium 8.5 (*)    AST 12 (*)    ALT 9 (*)    Total Bilirubin 0.2 (*)    All other components within normal limits  URINALYSIS COMPLETEWITH MICROSCOPIC ([facility] ONLY) - Abnormal; Notable for the following:    Color, Urine YELLOW (*)    APPearance HAZY (*)    Leukocytes, UA 3+ (*)    Bacteria, UA RARE (*)    Squamous Epithelial / LPF 6-30 (*)    All other components within normal limits  CBC WITH DIFFERENTIAL/PLATELET  LIPASE, BLOOD  POCT PREGNANCY, URINE    ____________________________________________   EKG  None  ____________________________________________    RADIOLOGY I have personally reviewed any xrays that were ordered on this patient:   ____________________________________________   PROCEDURES  Procedure(s) performed: none  Critical Care performed: none  ____________________________________________   INITIAL IMPRESSION / ASSESSMENT AND PLAN / ED COURSE  Pertinent labs & imaging results that were available during my care of the patient were reviewed by me and considered in my medical decision making (see chart for details).  Patient with chronic pelvic pain, given Toradol IM which completely relieved her pain. She is anxious to leave after treatment. She knows to return if any worsening pain vaginal bleeding fevers or chills  ____________________________________________   FINAL CLINICAL IMPRESSION(S) / ED DIAGNOSES  Final diagnoses:  Endometriosis     Lavonia Drafts, MD 10/12/14 [phone]

## 2014-10-16 DIAGNOSIS — G43019 Migraine without aura, intractable, without status migrainosus: Secondary | ICD-10-CM | POA: Diagnosis not present

## 2014-10-16 DIAGNOSIS — N800 Endometriosis of uterus: Secondary | ICD-10-CM | POA: Diagnosis not present

## 2014-10-16 DIAGNOSIS — F331 Major depressive disorder, recurrent, moderate: Secondary | ICD-10-CM | POA: Diagnosis not present

## 2014-10-16 DIAGNOSIS — R945 Abnormal results of liver function studies: Secondary | ICD-10-CM | POA: Diagnosis not present

## 2014-10-16 DIAGNOSIS — D259 Leiomyoma of uterus, unspecified: Secondary | ICD-10-CM | POA: Diagnosis not present

## 2014-10-26 ENCOUNTER — Ambulatory Visit (INDEPENDENT_AMBULATORY_CARE_PROVIDER_SITE_OTHER): Payer: Medicare Other | Admitting: Licensed Clinical Social Worker

## 2014-10-26 DIAGNOSIS — F4321 Adjustment disorder with depressed mood: Secondary | ICD-10-CM | POA: Diagnosis not present

## 2014-10-26 DIAGNOSIS — F331 Major depressive disorder, recurrent, moderate: Secondary | ICD-10-CM

## 2014-10-26 DIAGNOSIS — F79 Unspecified intellectual disabilities: Secondary | ICD-10-CM

## 2014-10-26 DIAGNOSIS — F4310 Post-traumatic stress disorder, unspecified: Secondary | ICD-10-CM

## 2014-10-26 NOTE — Progress Notes (Signed)
THERAPIST PROGRESS NOTE  Session Time: 11:05 a.m. - 12:15 p.m.  Participation Level: Active  Behavioral Response: CasualAlertAnxious and Depressed  Type of Therapy: Individual Therapy  Treatment Goals addressed: Anxiety and Coping  Interventions: Strength-based, Supportive and Reframing  Summary: Teresa Crawford is a 29 y.o. female who presents with symptoms of anxiety and depression, further complicated by chronic pain associated with Endometriosis and fibroid tumors.  Her pain was so bothersome at last session that she informed Teresa Crawford that she went to the ER here at [facility].  She was prescribed Naproxen and felt satisfied that the ER doctor "He was really trying to help me."  Is having hot flashes and difficulty sleeping due to pain.  Does report better control over her irritability.  Talked about her faith and that her efforts are directed towards: "Be a better Christian. I'm letting God handle this. I've asked God for help."    New stressor is recent unexpected expenses to have her dog's shots and to board her dog for trip out of town with boyfriend. "I love the two of them."  Is no longer going to classes for her GED and only task left is to take the test.  Only set back is the costs of the tests.  "I keep wanting to be a CNA but not until they can get this straightened out."   She was referring to her pain and health problems. On a positive note, she shared with Teresa Crawford that "I did what you suggested. I asked my primary doctor to refer me to that doctor we found in [place].  He doesn't take my insurance."  Her PCP's office is now trying to get client a referral to be seen by the Endometriosis specialist at [facility].  She talked about missing her deceased grand-parents, both of whom died in 11-17-12, 12 days apart from one another.  Amica tried to pull up her grand-mother's obituary on-line on her cell phone but was unable to do so.  Client continues having dreams with her grand-parents in them  and discussed how real the dreams seem to her when she wakes up.  Chalon's grief reactions are appropriate.  Ardie talked about how she is ooking forward to a beach trip with her boyfriend and his family in two weeks.  She's afraid she will be in too much pain to enjoy herself. Overall she does report that her mood is better compared to when she first came into this clinic yet her mood is heavily influenced by how much pain she is in.  Suicidal/Homicidal: Negativewithout intent/plan  Therapist Response:   Teresa Crawford provided client with supportive therapy with insight along with emotional and social support to reinforce client's strengths and available resources.  Praised her for advocating for herself at 37 appointments and speaking up in therapy about what is bothering her and reinforced the importance of things and people that add quality and happiness to her life such as her dog, boyfriend and being able to talk to her brothers and sisters on the telephone. Normalized feelings of sadness and longing for her deceased loved ones and offered support about the healing process being different for different people.  Teresa Crawford offered education about common irrational fears and beliefs that contribute to anxiety and depression. Reviewed and showed client how to use CBT/REBT strategies to recognize and re-frame irrational fears and self-talk as a means of increasing client's capacity to cope better with worries and stress.  Provided her with a few telephone numbers  to local agencies/services that may be able to offer her financial assistance to get her dog the care it needs.  Plan:  Return again in two weeks.  Client will ask for help as needed to secure medical  that is appropriate to diagnose and treat her pelvic pain.  She will keep all scheduled appointments and take medications as prescribed.  Lashawnda is aware of how to access emergency services including law enforcement, 911 and family  members.  Diagnosis: Major Depressive Disorder, Recurrent, Moderate    PTSD    Grief    Intellectual Delay    Miguel Dibble, Teresa Crawford 10/26/2014

## 2014-10-29 DIAGNOSIS — F331 Major depressive disorder, recurrent, moderate: Secondary | ICD-10-CM | POA: Insufficient documentation

## 2014-10-29 DIAGNOSIS — F4321 Adjustment disorder with depressed mood: Secondary | ICD-10-CM | POA: Insufficient documentation

## 2014-10-29 DIAGNOSIS — Z634 Disappearance and death of family member: Secondary | ICD-10-CM | POA: Insufficient documentation

## 2014-10-29 DIAGNOSIS — F4310 Post-traumatic stress disorder, unspecified: Secondary | ICD-10-CM | POA: Insufficient documentation

## 2014-11-09 ENCOUNTER — Ambulatory Visit: Payer: Self-pay | Admitting: Licensed Clinical Social Worker

## 2014-11-13 DIAGNOSIS — F70 Mild intellectual disabilities: Secondary | ICD-10-CM | POA: Diagnosis not present

## 2014-11-14 DIAGNOSIS — F70 Mild intellectual disabilities: Secondary | ICD-10-CM | POA: Diagnosis not present

## 2014-11-16 ENCOUNTER — Encounter: Payer: Self-pay | Admitting: Psychiatry

## 2014-11-16 ENCOUNTER — Ambulatory Visit (INDEPENDENT_AMBULATORY_CARE_PROVIDER_SITE_OTHER): Payer: Medicare Other | Admitting: Licensed Clinical Social Worker

## 2014-11-16 ENCOUNTER — Ambulatory Visit (INDEPENDENT_AMBULATORY_CARE_PROVIDER_SITE_OTHER): Payer: Medicare Other | Admitting: Psychiatry

## 2014-11-16 VITALS — BP 118/78 | HR 75 | Temp 98.2°F | Ht 65.5 in | Wt 184.4 lb

## 2014-11-16 DIAGNOSIS — F4321 Adjustment disorder with depressed mood: Secondary | ICD-10-CM | POA: Diagnosis not present

## 2014-11-16 DIAGNOSIS — F4310 Post-traumatic stress disorder, unspecified: Secondary | ICD-10-CM | POA: Diagnosis not present

## 2014-11-16 DIAGNOSIS — F331 Major depressive disorder, recurrent, moderate: Secondary | ICD-10-CM

## 2014-11-16 DIAGNOSIS — F79 Unspecified intellectual disabilities: Secondary | ICD-10-CM | POA: Diagnosis not present

## 2014-11-16 MED ORDER — SERTRALINE HCL 50 MG PO TABS: 75.0000 mg | ORAL_TABLET | Freq: Every day | ORAL | Status: DC

## 2014-11-16 MED ORDER — TRAZODONE HCL 100 MG PO TABS: 100.0000 mg | ORAL_TABLET | Freq: Every day | ORAL | Status: DC

## 2014-11-16 MED ORDER — MIRTAZAPINE 7.5 MG PO TABS: 7.5000 mg | ORAL_TABLET | Freq: Every day | ORAL | Status: DC

## 2014-11-16 NOTE — Progress Notes (Signed)
THERAPIST PROGRESS NOTE  Session Time: 9:03 a.m. - 10:10 a.m.  Participation Level: Active  Behavioral Response: NeatAlertDepressed  Type of Therapy: Individual Therapy  Treatment Goals addressed: Anxiety and Coping  Interventions: Solution Focused, Strength-based, Supportive and Reframing  Summary: Teresa Crawford is a 29 y.o. female who returns to OPT and complaining of ongoing abdominal pain and some concern about not starting her period. 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 She told LCSW that the pain medication is not helping her and it only makes her tired so therefore she rarely takes it anymore.  Client talked about her recent beach trip with her fiance', Teresa Crawford, and his family to attend a family wedding.  She shared events of her trip.  Her pain did interfere with the trip and she admitted "I had to fake it." Overall she voiced that it was a good time and she enjoyed meeting more of fiance's family members.  "I'm in pain. I'm still afraid." She described her fear as "...dying from a broken heart."  She shared with LCSW that she thinks this is what happened to her grand-mother following the death of her grand-father.  Teresa Crawford continues to grieve deaths of her parents and grand-parents. "I don't know what to do." Teresa Crawford was referring to not knowing how to cope with her grief.  She is wanting to get a job and with good insight that this will give her something to do and get her out of her apartment since she finds self sitting around with ruminating thoughts of family.  Teresa Crawford is her [facility] case worker and she is going to contact client's medical providers per Teresa Crawford in hopes of helping her to get an appointment with a specialist.  She was receptive to suggestions by LCSW to make coping cards and assisted LCSW to choose  a few on-line that she could relate to and read when she feels sad or deep pain related to grief.  Will think about the story writing.  Suicidal/Homicidal: Negativewithout intent/plan  Therapist Response:   LCSW provided client with supportive therapy with insight along with emotional and social support to reinforce client's strengths and available resources.  Praised her for advocating for herself at 30 appointments and speaking up in therapy about what is bothering her and reinforced the importance of things and people that add quality and happiness to her life such as her dog, boyfriend and being able to talk to her brothers and sisters on the telephone. Normalized feelings of sadness and grief reactions and encouraged client to continue talking about her feelings and recommended maybe writing a story of a good memory/fun time with each of her family members to share at next session.  Plan:  Return again in two weeks.  Continue addressing grief and assess need for additional referrals. Client will ask for help as needed to secure medical care  that is appropriate to diagnose and treat her pelvic pain.  She will keep all scheduled appointments and take medications as prescribed.  Teresa Crawford is aware of how to access emergency services including law enforcement, 911 and family members should she not feel well or feel threatened or in danger.  Diagnosis:  Major Depressive Disorder, Recurrent, Moderate    PTSD    Grief    Intellectual Delay    Teresa Dibble,  LCSW 11/16/2014

## 2014-11-16 NOTE — Progress Notes (Signed)
BH MD/PA/NP OP Progress Note  11/16/2014 11:24 AM Teresa Crawford  MRN:  [id number]  Subjective:  Patient related that the biggest issues that 2 nights ago she had a dream about her late father. She states the next morning when she woke up she was crying because of the drinking. She is able to state the dream was good but the thoughts of her father made her feel sad. She states that this has not been an issue a prolonged days of depression or sadness. In fact she states she enjoyed a beach trip that her group home when on several weeks ago and she looks forward to another upcoming beach trip that they will be taking soon. She continues to enjoy her dog "[place]."  A staff member from her home accompanied at her appointment and indicated that things are going well. The issue with appetite has improved as patient is eating and gaining weight. We have discussed that she is in the midst of having GI issues assessed and that the restarting of the Remeron has improved her appetite. However we have decided we will try to decrease the dose to address the issue of weight gain. Her staff worker she has been referred to a practice in [place] although staff worker is not clear as to what specialty this is for. Overall patient feels she is doing well, getting out and is enjoying her therapy with a therapist in this office.  Chief Complaint:  Chief Complaint    Follow-up; Depression     not eating Visit Diagnosis:     ICD-9-CM ICD-10-CM   1. Posttraumatic stress disorder 309.81 F43.10   2. Major depressive disorder, recurrent episode, moderate 296.32 F33.1     Past Medical History:  Past Medical History  Diagnosis Date  . Migraine   . Mental retardation   . Posttraumatic stress disorder   . Depression   . Hepatic steatosis   . Splenomegaly   . Elevated liver enzymes   . Uterine fibroid   . Endometriosis   . Chronic back pain   . Hypothyroid   . Chronic fatigue syndrome   . Scoliosis   . PUD  (peptic ulcer disease)     Past Surgical History  Procedure Laterality Date  . Back surgery    . Laparoscopic endometriosis fulguration  2015    [name]  . Colonoscopy  04/27/2006    Wohl-colitis, internal hemorrhoids  . Esophagogastroduodenoscopy  02/2013    multiple peptic & duodenal ulcers, negative h pylori  . Colonoscopy  02/2013    focal right colitis, left colon biopsy negative, focal active proctitis without chronicity   Family History:  Family History  Problem Relation Age of Onset  . Breast cancer Mother   . COPD Father   . Lung cancer Father   . Dementia Father   . Diabetes Sister   . Diabetes Brother   . Ovarian cancer Paternal Aunt   . Colon cancer Maternal Grandmother   . Liver disease Maternal Aunt     ?etiology   Social History:  Social History   Social History  . Marital Status: Single    Spouse Name: N/A  . Number of Children: 0  . Years of Education: N/A   Social History Main Topics  . Smoking status: Never Smoker   . Smokeless tobacco: Never Used  . Alcohol Use: No  . Drug Use: No  . Sexual Activity: No   Other Topics Concern  . None   Social  History Narrative   She has brother, half brother & sister live nearby   Both parents deceased   Care managed by [name]   Additional History:   Assessment:   Musculoskeletal: Strength & Muscle Tone: within normal limits Gait & Station: normal Patient leans: N/A  Psychiatric Specialty Exam: Depression        Associated symptoms include does not have insomnia and no suicidal ideas.   Review of Systems  Psychiatric/Behavioral: Negative for depression, suicidal ideas, hallucinations, memory loss and substance abuse. The patient is not nervous/anxious and does not have insomnia.     Blood pressure 118/78, pulse 75, temperature 98.2 F (36.8 C), temperature source Tympanic, height 5' 5.5" (1.664 m), weight 184 lb 6.4 oz (83.643 kg), SpO2 98 %.Body mass index is 30.21 kg/(m^2).  General  Appearance: Fairly Groomed  Eye Contact:  Fair  Speech:  Clear and Coherent and Normal Rate more talkative than in last appointments.   Volume:  Normal  Mood:  Good  Affect:  Congruent  Thought Process:  Concrete  Orientation:  Full (Time, Place, and Person)  Thought Content:  Negative  Suicidal Thoughts:  No  Homicidal Thoughts:  No  Memory:  Immediate;   Good Recent;   Good Remote;   Good  Judgement:  Fair  Insight:  Fair  Psychomotor Activity:  Negative  Concentration:  Good  Recall:  Good  Fund of Knowledge: Fair  Language: Fair  Akathisia:  Negative  Handed:  Right unknown  AIMS (if indicated):  Not done  Assets:  Social Support  ADL's:  Intact  Cognition: Impaired,  Moderate  Sleep:  Good    Is the patient at risk to self?  No. Has the patient been a risk to self in the past 6 months?  No. Has the patient been a risk to self within the distant past?  No. Is the patient a risk to others?  No. Has the patient been a risk to others in the past 6 months?  No. Has the patient been a risk to others within the distant past?  No.  Current Medications: Current Outpatient Prescriptions  Medication Sig Dispense Refill  . Butalbital-APAP-Caffeine 50-300-40 MG CAPS Take by mouth.    . Cholecalciferol (D 1000) 1000 UNITS capsule Take by mouth.    . docusate sodium (COLACE) 100 MG capsule Take by mouth.    . EPINEPHrine (EPIPEN 2-PAK) 0.3 mg/0.3 mL IJ SOAJ injection Inject into the muscle.    . hydrOXYzine (VISTARIL) 50 MG capsule Take by mouth.    Marland Kitchen leuprolide (LUPRON DEPOT) 11.25 MG injection Inject into the muscle.    . mirtazapine (REMERON) 15 MG tablet Take 1 tablet (15 mg total) by mouth at bedtime. 30 tablet 2  . naproxen (NAPROSYN) 500 MG tablet Take 1 tablet (500 mg total) by mouth 2 (two) times daily with a meal. 20 tablet 2  . Norethin-Eth Estradiol-Fe [facility] FE,WYMZYA Orrin Brigham) 0.4-35 MG-MCG tablet Chew by mouth.    Marland Kitchen omeprazole (PRILOSEC) 20 MG capsule  Take by mouth.    . promethazine (PHENERGAN) 12.5 MG tablet Take by mouth.    . sertraline (ZOLOFT) 50 MG tablet Take 1.5 tablets (75 mg total) by mouth daily. 45 tablet 2  . SUMAtriptan (IMITREX) 50 MG tablet Take by mouth.    . topiramate (TOPAMAX) 50 MG tablet Take by mouth.    . traMADol (ULTRAM) 50 MG tablet Take 50 mg by mouth every 6 (six) hours as needed. For  pain.    . traZODone (DESYREL) 100 MG tablet Take 1 tablet (100 mg total) by mouth at bedtime. 30 tablet 2  . Vitamin D, Ergocalciferol, (DRISDOL) 50000 UNITS CAPS capsule Take by mouth.     No current facility-administered medications for this visit.    Medical Decision Making:  Established Problem, Stable/Improving (1)  Treatment Plan Summary:Medication management we will continue her sertraline at 75 mg daily, we will continue the trazodone 100 mg at bedtime. We will decrease her Remeron to 7.5 mg at bedtime. Overall her mood has improved on the current regimen and she's been more outgoing. Patient will follow up in 2 months. She will continue her therapy.  Faith Rogue 11/16/2014, 11:24 AM

## 2014-11-26 DIAGNOSIS — N800 Endometriosis of uterus: Secondary | ICD-10-CM | POA: Diagnosis not present

## 2014-11-26 DIAGNOSIS — D259 Leiomyoma of uterus, unspecified: Secondary | ICD-10-CM | POA: Diagnosis not present

## 2014-11-26 DIAGNOSIS — N9489 Other specified conditions associated with female genital organs and menstrual cycle: Secondary | ICD-10-CM | POA: Diagnosis not present

## 2014-11-26 DIAGNOSIS — Z803 Family history of malignant neoplasm of breast: Secondary | ICD-10-CM | POA: Diagnosis not present

## 2014-11-28 DIAGNOSIS — M79675 Pain in left toe(s): Secondary | ICD-10-CM | POA: Diagnosis not present

## 2014-11-28 DIAGNOSIS — M79674 Pain in right toe(s): Secondary | ICD-10-CM | POA: Diagnosis not present

## 2014-11-28 DIAGNOSIS — B351 Tinea unguium: Secondary | ICD-10-CM | POA: Diagnosis not present

## 2014-11-30 ENCOUNTER — Ambulatory Visit (INDEPENDENT_AMBULATORY_CARE_PROVIDER_SITE_OTHER): Payer: Medicare Other | Admitting: Licensed Clinical Social Worker

## 2014-11-30 DIAGNOSIS — F4321 Adjustment disorder with depressed mood: Secondary | ICD-10-CM | POA: Diagnosis not present

## 2014-11-30 DIAGNOSIS — F79 Unspecified intellectual disabilities: Secondary | ICD-10-CM | POA: Diagnosis not present

## 2014-11-30 DIAGNOSIS — F4310 Post-traumatic stress disorder, unspecified: Secondary | ICD-10-CM | POA: Diagnosis not present

## 2014-11-30 DIAGNOSIS — F331 Major depressive disorder, recurrent, moderate: Secondary | ICD-10-CM
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
## 2014-12-13 ENCOUNTER — Ambulatory Visit (INDEPENDENT_AMBULATORY_CARE_PROVIDER_SITE_OTHER): Payer: Medicare Other | Admitting: Licensed Clinical Social Worker

## 2014-12-13 DIAGNOSIS — F79 Unspecified intellectual disabilities: Secondary | ICD-10-CM | POA: Diagnosis not present

## 2014-12-13 DIAGNOSIS — F4310 Post-traumatic stress disorder, unspecified: Secondary | ICD-10-CM | POA: Diagnosis not present

## 2014-12-13 DIAGNOSIS — F331 Major depressive disorder, recurrent, moderate: Secondary | ICD-10-CM

## 2014-12-13 DIAGNOSIS — F4321 Adjustment disorder with depressed mood: Secondary | ICD-10-CM

## 2014-12-13 NOTE — Progress Notes (Signed)
THERAPIST PROGRESS NOTE  Session Time:  2:01 p.m. - 3:15 p.m.  Participation Level: Active  Behavioral Response: NeatAlertAnxious and Depressed  Type of Therapy: Individual Therapy  Treatment Goals addressed: Communication: assertiveness and boundary setting and Coping  Interventions: Solution Focused, Psychosocial Skills: Grief counseling, Supportive and Reframing  Summary: Teresa Crawford is a 29 y.o. female who returns to [place] with ongoing struggles with grief reactions, physical and emotional pain, frustration around her medication condition and new stress in relationship with her fiance' since she has maintained a friendship with a former boyfriend.  Recent beach trip with [place] was enjoyable to a degree but much anxiety because Toriana stated that prior to leaving for her trip her brother posted something on social media that per client seems to mean that he is sick and dying.  "He keeps making excuses why he can't come and see me."  Recent phone conversation with her sister locally did not reveal more information about her brother. Per client, "She did calm me down for a little bit."  The family is having a reunion this week-end but client is uncertain if she's going or not.  "I know something big is wrong with him. I can't fault him for not telling me."  Much sadness about what is being kept from her with [name] indicating "I don't know if I'll make it without him."  Client talked about ongoing dreams that she is curious about yet denied that these are frightening to her.  Recent dream per client was of herself drowning and dying as a result. "My soul left my body and I could see and hear everybody around me trying to save me."  Client excitedly talked about a dream of her mother whom she has not dreamed about in sometime and content was that the mother, unlike her father and grand-parents who are usually alive in her dreams, was deceased and took Fusaye with her  wherever she was and client described feeling at peace and that it was a beautiful place but "It wasn't my time to stay with her."  As a result of worries over brother's health and dreams from deceased loved ones, Shantrice has not concluded that she believes maybe she is the one who is going to die.  Additional concern voiced about herself having cancer.   Client with a follow up appointment with her PCP for breast exam since client is at a higher risk since her mother's death was due to breast cancer. "I know my body."    Additional stressor discussed was her efforts to be friends with her ex-boyfriend who she dating before fiance' and whom she dated a short time after breaking up with fiance'. "I don't want to hurt his feelings" was comment about ex-boyfriend although Keianna has made it clear to this female that she is with current boyfriend and is happy. Client was receptive to LCSW's input about setting boundaries and being careful to not feel responsible for someone else's feelings. Courtnee denied that she felt that anyone was abusing or using her and voiced some discomfort with a few questions that her boyfriend asks of her.  Tishara voiced understanding of likelihood that her dreams may represent more the over-whelming stress and emotions that she is feeling rather than that she or someone she loves is going to die. On a positive note she shared that she has started back writing music.  Suicidal/Homicidal: Negativewithout intent/plan   Therapist Response:   LCSW assessed client's  functioning in context of physical and emotional symptoms.  Explored various theories or hypotheses around dream interpretations and gently re-framed irrational cognitions around serious illness and beliefs that she or her brother are going to die soon.  Also provided client with emotional and social support to reinforce client's strengths and available resources.  Psycho-education around setting boundaries with people in her life  and tips for recognizing when these people are being too invasive in her life.  Questioned client about how she is treated and what favors or information others like her siblings and boyfriend ask of her. Discussed assertive communication strategies and gently cautioned about certain thinking errors like jumping to conclusions, feelings are facts and catastrophisizing especially in the context of "if" her brother is terminally ill and how she may or may not cope should he die. Suggested that when she has these dreams that she talk to her support team at [facility] and to reflect on interpretations discussed in session today.  Normalized her feelings.  Plan:  Return again in two weeks.  Continue addressing grief and assess need for additional referrals. Client will ask for help as needed to secure medical care  that is appropriate to diagnose and treat her pelvic pain.  She will keep all scheduled appointments and take medications as prescribed.  Kanisha is aware of how to access emergency services including law enforcement, 911 and family members should she not feel well or feel threatened or in danger.  Diagnosis:  Major Depressive Disorder, Recurrent, Moderate    PTSD    Grief    Intellectual Delay  Miguel Dibble, LCSW 12/13/2014

## 2014-12-14 ENCOUNTER — Ambulatory Visit: Payer: Self-pay | Admitting: Licensed Clinical Social Worker

## 2014-12-14 DIAGNOSIS — N800 Endometriosis of uterus: Secondary | ICD-10-CM | POA: Diagnosis not present

## 2014-12-14 DIAGNOSIS — F331 Major depressive disorder, recurrent, moderate: Secondary | ICD-10-CM | POA: Diagnosis not present

## 2014-12-14 DIAGNOSIS — R635 Abnormal weight gain: Secondary | ICD-10-CM | POA: Diagnosis not present

## 2014-12-14 DIAGNOSIS — G43019 Migraine without aura, intractable, without status migrainosus: Secondary | ICD-10-CM | POA: Diagnosis not present

## 2014-12-14 DIAGNOSIS — R110 Nausea: Secondary | ICD-10-CM | POA: Diagnosis not present

## 2014-12-17 ENCOUNTER — Ambulatory Visit: Payer: Medicare Other | Admitting: Physical Therapy

## 2014-12-17 DIAGNOSIS — R635 Abnormal weight gain: Secondary | ICD-10-CM | POA: Diagnosis not present

## 2014-12-17 DIAGNOSIS — E039 Hypothyroidism, unspecified: Secondary | ICD-10-CM | POA: Diagnosis not present

## 2014-12-24 ENCOUNTER — Ambulatory Visit: Payer: Medicare Other | Attending: Obstetrics and Gynecology | Admitting: Physical Therapy

## 2014-12-24 DIAGNOSIS — Z7409 Other reduced mobility: Secondary | ICD-10-CM | POA: Diagnosis not present

## 2014-12-24 DIAGNOSIS — R531 Weakness: Secondary | ICD-10-CM

## 2014-12-24 DIAGNOSIS — M609 Myositis, unspecified: Secondary | ICD-10-CM | POA: Diagnosis not present

## 2014-12-24 DIAGNOSIS — R279 Unspecified lack of coordination: Secondary | ICD-10-CM | POA: Insufficient documentation

## 2014-12-24 DIAGNOSIS — M791 Myalgia: Secondary | ICD-10-CM | POA: Insufficient documentation

## 2014-12-24 DIAGNOSIS — IMO0001 Reserved for inherently not codable concepts without codable children: Secondary | ICD-10-CM

## 2014-12-25 DIAGNOSIS — J029 Acute pharyngitis, unspecified: Secondary | ICD-10-CM | POA: Diagnosis not present

## 2014-12-25 DIAGNOSIS — J069 Acute upper respiratory infection, unspecified: Secondary | ICD-10-CM | POA: Diagnosis not present

## 2014-12-25 NOTE — Therapy (Deleted)
[place] MAIN [facility] SERVICES [address] [place], [place], [address] Phone: [phone]   Fax:  [phone]  Patient Details  Name: Teresa Crawford MRN: [id number] Date of Birth: 09/22/1985 Referring Provider:  Illene Bolus, MD  Encounter Date: 12/24/2014   Jerl Mina ,PT, DPT, E-RYT  12/25/2014, 4:15 PM  [place] MAIN [facility] SERVICES [address] [place], [place], [address] Phone: [phone]   Fax:  [phone]

## 2014-12-28 ENCOUNTER — Ambulatory Visit: Payer: Self-pay | Admitting: Licensed Clinical Social Worker

## 2014-12-31 ENCOUNTER — Ambulatory Visit: Payer: Medicare Other | Admitting: Physical Therapy

## 2014-12-31 DIAGNOSIS — M609 Myositis, unspecified: Secondary | ICD-10-CM | POA: Diagnosis not present

## 2014-12-31 DIAGNOSIS — IMO0001 Reserved for inherently not codable concepts without codable children: Secondary | ICD-10-CM

## 2014-12-31 DIAGNOSIS — Z7409 Other reduced mobility: Secondary | ICD-10-CM | POA: Diagnosis not present

## 2014-12-31 DIAGNOSIS — R279 Unspecified lack of coordination: Secondary | ICD-10-CM | POA: Diagnosis not present

## 2014-12-31 DIAGNOSIS — R531 Weakness: Secondary | ICD-10-CM | POA: Diagnosis not present

## 2014-12-31 DIAGNOSIS — M791 Myalgia: Secondary | ICD-10-CM | POA: Diagnosis not present

## 2014-12-31 NOTE — Addendum Note (Signed)
Addended by: Jerl Mina on: 12/31/2014 03:36 PM   Modules accepted: Orders
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
## 2014-12-31 NOTE — Addendum Note (Signed)
Addended by: Jerl Mina on: 12/31/2014 02:22 PM   Modules accepted: Orders
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
## 2014-12-31 NOTE — Patient Instructions (Addendum)
Seated:  1) Bend to L side and arch your body like a rainbow. 10 reps/ 3x d day   2) Bend head your head to Right and Left 10 x / 3 x day   Laying on your back   3) Breathing exercise : Diaphragmatic breathing. 10x/ 3 x day   You are now ready to begin training the deep core muscles system: diaphragm, transverse abdominis, pelvic floor . These muscles must work together as a Therapist, occupational.   Investment banker, operational down inhale on a count of 1-2 , pause, (expanding your belly/ ribcage, relaxing pelvic floor muscles)  Exhale, on a count of 2-1, pause (belly sink in)

## 2014-12-31 NOTE — Patient Instructions (Signed)
   Scar Tissue Massage       Applied directly to the lesion and transverse to the direction of the fibres.      This deep tissue massage technique can have very good results in a mature or immature scar.      Never progress beyond a comfort level.  Lubrication of the scar helps soften it and increase its pliability.      Mediums such as lotion, castor oil, vitamin E oil, essential oils or other oils can prevent the scar from drying out and re-opening.          Cautions:  While treatment is most effective when a scar is still in its immature phase, it is also better to have a doctor's permission. A few additional cautions for immature scars include:      Take extreme care with radiated tissues, as the skin is delicate and can break easily.      Aside from friction massage, do not continue if your actions cause pain or increase tissue redness.      Never perform massage on any open lesions.
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
## 2014-12-31 NOTE — Addendum Note (Signed)
Addended by: Jerl Mina on: 12/31/2014 04:17 PM   Modules accepted: Orders
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
## 2015-01-03 DIAGNOSIS — Z1231 Encounter for screening mammogram for malignant neoplasm of breast: Secondary | ICD-10-CM | POA: Diagnosis not present

## 2015-01-04 ENCOUNTER — Ambulatory Visit (INDEPENDENT_AMBULATORY_CARE_PROVIDER_SITE_OTHER): Payer: Medicare Other | Admitting: Licensed Clinical Social Worker

## 2015-01-04 DIAGNOSIS — F4310 Post-traumatic stress disorder, unspecified: Secondary | ICD-10-CM

## 2015-01-04 DIAGNOSIS — F4321 Adjustment disorder with depressed mood: Secondary | ICD-10-CM | POA: Diagnosis not present

## 2015-01-04 DIAGNOSIS — F79 Unspecified intellectual disabilities: Secondary | ICD-10-CM | POA: Diagnosis not present

## 2015-01-04 DIAGNOSIS — F331 Major depressive disorder, recurrent, moderate: Secondary | ICD-10-CM | POA: Diagnosis not present
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
## 2015-01-07 ENCOUNTER — Ambulatory Visit: Payer: Medicare Other | Attending: Obstetrics and Gynecology | Admitting: Physical Therapy

## 2015-01-07 DIAGNOSIS — M791 Myalgia: Secondary | ICD-10-CM | POA: Diagnosis not present

## 2015-01-07 DIAGNOSIS — R279 Unspecified lack of coordination: Secondary | ICD-10-CM | POA: Insufficient documentation

## 2015-01-07 DIAGNOSIS — M609 Myositis, unspecified: Secondary | ICD-10-CM | POA: Insufficient documentation

## 2015-01-07 DIAGNOSIS — IMO0001 Reserved for inherently not codable concepts without codable children: Secondary | ICD-10-CM

## 2015-01-07 DIAGNOSIS — Z7409 Other reduced mobility: Secondary | ICD-10-CM | POA: Diagnosis not present

## 2015-01-07 DIAGNOSIS — R531 Weakness: Secondary | ICD-10-CM | POA: Insufficient documentation

## 2015-01-07 NOTE — Patient Instructions (Addendum)
In and out bed technique   Log rolling, picking up both leg at the same time  Exhale as you lift legs  __________________________  Adductor squeeze: with pillow between knees and 5 sec holds with exhalation 5sec, 20 reps, 3 x day with ice over the area. Squeeze before the point of pain  Ice for 20 min at a time within an hour with towel between ice and skin

## 2015-01-08 NOTE — Therapy (Signed)
[place] MAIN [facility] SERVICES [address] [place], [place], [address] Phone: [phone]   Fax:  [phone]  Physical Therapy Treatment  Patient Details  Name: Teresa Crawford MRN: [id number] Date of Birth: 1985-06-20 Referring Provider:  Lavera Guise, MD  Encounter Date: 01/07/2015      PT End of Session - 01/08/15 0951    Visit Number 2   Number of Visits 12   Date for PT Re-Evaluation 03/11/15   Authorization Type g-code aat 10th visit   PT Start Time 0915   PT Stop Time 1000   PT Time Calculation (min) 45 min   Activity Tolerance Patient tolerated treatment well;No increased pain   Behavior During Therapy [facility] for tasks assessed/performed      Past Medical History  Diagnosis Date  . Migraine   . Mental retardation   . Posttraumatic stress disorder   . Depression   . Hepatic steatosis   . Splenomegaly   . Elevated liver enzymes   . Uterine fibroid   . Endometriosis   . Chronic back pain   . Hypothyroid   . Chronic fatigue syndrome   . Scoliosis   . PUD (peptic ulcer disease)     Past Surgical History  Procedure Laterality Date  . Back surgery    . Laparoscopic endometriosis fulguration  2015    [name]  . Colonoscopy  04/27/2006    Wohl-colitis, internal hemorrhoids  . Esophagogastroduodenoscopy  02/2013    multiple peptic & duodenal ulcers, negative h pylori  . Colonoscopy  02/2013    focal right colitis, left colon biopsy negative, focal active proctitis without chronicity    There were no vitals filed for this visit.  Visit Diagnosis:  Mobility impaired  Lack of coordination  Weakness  Myalgia and myositis      Subjective Assessment - 01/07/15 0920    Subjective Pt reported "feeling good" after last session. Pt felt "relieved" with her HEP. She states it "really helps" her back.  Pt stated she slipped and fell in her bathroom two days ago, landed in split and her inner thighs hurt at 9/10 level of  pain.     Patient is accompained by:  aid    Pertinent History Pt lives in a supported living in her apt with with 3 hrs of staffing per day.    Patient Stated Goals " Not being in pain and for my muscles to loosen "            [facility] PT Assessment - 01/08/15 0946    Palpation   Spinal mobility decreased mm tensions along paraspinals, upper back   SI assessment  tenderness to deep palpation over pubic symphysis, adductors bilaterally   Bed Mobility   Bed Mobility --  wincing w/hip abd/ER, less pain w. log rolling cues    Ambulation/Gait   Gait Comments Antalgic gait, L lateral trunk  sway                     [facility] Adult PT Treatment/Exercise - 01/08/15 0946    Therapeutic Activites    ADL's sit <> supine 3 reps with exhalation and bodymechanics    Exercises   Other Exercises  adductor squeeze 10 reps x 2 sets, withheld hip abd isometric due to pain   Manual Therapy   Myofascial Release adductors bilaterally                PT Education - 01/07/15  1004    Education provided Yes   Education Details  HEP    Person(s) Educated Patient;Caregiver(s)   Methods Explanation;Demonstration;Tactile cues;Verbal cues;Handout   Comprehension Verbalized understanding;Returned demonstration             PT Long Term Goals - 12/31/14 1605    PT LONG TERM GOAL #1   Title Pt will decrease her score on [place] from 14% to < 12% in order to demo IND with managing pain.    Time 12   Period Weeks   Status New   PT LONG TERM GOAL #2   Title Pt will demo improved mobility of her longitudinal spinal scar in order to improve ROM to perform flexibility porgram for self care.    Time 12   Period Weeks   Status New   PT LONG TERM GOAL #3   Title Pt will demo decreased posterior pelvic mm tensions and tenderness in order to decrease pain and increase QOL.    Time 12   Period Weeks   Status New   PT LONG TERM GOAL #4   Title Pt will increase her gait speed from 1.0 m/s to  1.2 m/s  in order to cross the road safely when pt is in the community.    Time 12   Period Weeks   Status New               Plan - 01/07/15 0956    Clinical Impression Statement Pt rpeorted 9/10 adductor pain decreased to 7/10.  Pt reported the log rolling technique in/out bed was better while her other way was "misery".  Pt demo'd decreased bacl mm tensions which demonstrates her compliance to HEP.    Pt will benefit from skilled therapeutic intervention in order to improve on the following deficits Decreased cognition;Decreased coordination;Decreased endurance;Decreased scar mobility;Increased fascial restricitons;Pain;Decreased safety awareness;Decreased range of motion;Decreased mobility;Impaired flexibility;Difficulty walking;Postural dysfunction;Increased muscle spasms;Improper body mechanics;Abnormal gait   PT Frequency 1x / week   PT Duration 12 weeks   PT Treatment/Interventions ADLs/Self Care Home Management;Cryotherapy;Moist Heat;Gait training;Stair training;Functional mobility training;Therapeutic activities;Therapeutic exercise;Balance training;Neuromuscular re-education;Patient/family education;Orthotic Fit/Training;Scar mobilization;Passive range of motion;Dry needling;Energy conservation;Taping;Manual techniques   Consulted and Agree with Plan of Care Patient   Family Member Consulted aid        Problem List Patient Active Problem List   Diagnosis Date Noted  . Major depressive disorder, recurrent episode, moderate ([place]) 10/29/2014  . PTSD (post-traumatic stress disorder) 10/29/2014  . Grief 10/29/2014  . CFIDS (chronic fatigue and immune dysfunction syndrome) 09/28/2014  . Aggrieved 09/28/2014  . Depression, major, recurrent ([place]) 09/28/2014  . Intellectual disability 09/28/2014  . Neurosis, posttraumatic 09/28/2014  . History of colitis 09/22/2014  . Hepatic steatosis 09/20/2014  . Endometriosis 08/30/2013    Jerl Mina ,PT, DPT, E-RYT  01/08/2015,  9:55 AM  [place] MAIN [facility] SERVICES [address] [place], [place], [address] Phone: [phone]   Fax:  [phone]

## 2015-01-14 DIAGNOSIS — G43009 Migraine without aura, not intractable, without status migrainosus: Secondary | ICD-10-CM | POA: Diagnosis not present

## 2015-01-14 DIAGNOSIS — R5383 Other fatigue: Secondary | ICD-10-CM | POA: Diagnosis not present

## 2015-01-14 DIAGNOSIS — E559 Vitamin D deficiency, unspecified: Secondary | ICD-10-CM | POA: Diagnosis not present

## 2015-01-14 DIAGNOSIS — E538 Deficiency of other specified B group vitamins: Secondary | ICD-10-CM | POA: Diagnosis not present

## 2015-01-14 DIAGNOSIS — R1084 Generalized abdominal pain: Secondary | ICD-10-CM | POA: Diagnosis not present

## 2015-01-14 DIAGNOSIS — E611 Iron deficiency: Secondary | ICD-10-CM | POA: Diagnosis not present

## 2015-01-14 DIAGNOSIS — T8069XS Other serum reaction due to other serum, sequela: Secondary | ICD-10-CM | POA: Diagnosis not present

## 2015-01-14 DIAGNOSIS — D6481 Anemia due to antineoplastic chemotherapy: Secondary | ICD-10-CM | POA: Diagnosis not present

## 2015-01-14 DIAGNOSIS — R635 Abnormal weight gain: Secondary | ICD-10-CM | POA: Diagnosis not present

## 2015-01-14 DIAGNOSIS — R110 Nausea: Secondary | ICD-10-CM | POA: Diagnosis not present

## 2015-01-14 DIAGNOSIS — E039 Hypothyroidism, unspecified: Secondary | ICD-10-CM | POA: Diagnosis not present

## 2015-01-14 DIAGNOSIS — E6609 Other obesity due to excess calories: Secondary | ICD-10-CM | POA: Diagnosis not present

## 2015-01-14 DIAGNOSIS — D509 Iron deficiency anemia, unspecified: Secondary | ICD-10-CM | POA: Diagnosis not present

## 2015-01-15 ENCOUNTER — Ambulatory Visit: Payer: Medicare Other | Admitting: Physical Therapy

## 2015-01-17 DIAGNOSIS — R739 Hyperglycemia, unspecified: Secondary | ICD-10-CM | POA: Diagnosis not present

## 2015-01-17 DIAGNOSIS — N809 Endometriosis, unspecified: Secondary | ICD-10-CM | POA: Diagnosis not present

## 2015-01-17 DIAGNOSIS — R635 Abnormal weight gain: Secondary | ICD-10-CM | POA: Diagnosis not present

## 2015-01-17 DIAGNOSIS — F331 Major depressive disorder, recurrent, moderate: Secondary | ICD-10-CM | POA: Diagnosis not present

## 2015-01-17 DIAGNOSIS — G43019 Migraine without aura, intractable, without status migrainosus: Secondary | ICD-10-CM | POA: Diagnosis not present

## 2015-01-17 DIAGNOSIS — E6609 Other obesity due to excess calories: Secondary | ICD-10-CM | POA: Diagnosis not present

## 2015-01-18 ENCOUNTER — Ambulatory Visit (INDEPENDENT_AMBULATORY_CARE_PROVIDER_SITE_OTHER): Payer: Medicare Other | Admitting: Licensed Clinical Social Worker

## 2015-01-18 ENCOUNTER — Encounter: Payer: Self-pay | Admitting: Psychiatry

## 2015-01-18 ENCOUNTER — Ambulatory Visit (INDEPENDENT_AMBULATORY_CARE_PROVIDER_SITE_OTHER): Payer: Medicare Other | Admitting: Psychiatry

## 2015-01-18 VITALS — BP 122/98 | HR 85 | Temp 97.9°F | Ht 63.0 in | Wt 198.0 lb

## 2015-01-18 DIAGNOSIS — F4321 Adjustment disorder with depressed mood: Secondary | ICD-10-CM | POA: Diagnosis not present

## 2015-01-18 DIAGNOSIS — F4310 Post-traumatic stress disorder, unspecified: Secondary | ICD-10-CM | POA: Diagnosis not present

## 2015-01-18 DIAGNOSIS — F79 Unspecified intellectual disabilities: Secondary | ICD-10-CM

## 2015-01-18 DIAGNOSIS — F331 Major depressive disorder, recurrent, moderate: Secondary | ICD-10-CM | POA: Diagnosis not present

## 2015-01-18 MED ORDER — TRAZODONE HCL 100 MG PO TABS: 100.0000 mg | ORAL_TABLET | Freq: Every day | ORAL | Status: DC

## 2015-01-18 NOTE — Progress Notes (Signed)
[facility] MD/PA/NP OP Progress Note  01/18/2015 11:08 AM Teresa Crawford  MRN:  [id number]  Subjective:  Follow-up of her posterior manic stress disorder and major depressive disorder, recurrent moderate. She saw her primary care physician yesterday. At that appointment the primary care discontinued her Zoloft, discontinued her Remeron and discontinued the Vistaril which the patient was really not using much of the Vistaril on a regular basis. According to patient and her care home staff member there are rationale was that these medications may be causing weight gain. Writer did discuss with patient and caregiver that Remeron might cause increased appetite and subsequent weight gain however when we discontinue the medication the patient reported difficulty sleeping and also decreased appetite.  Primary care physician started Cymbalta 20 mg daily. Explained that given that these interventions have already occurred and it appears the primary care is also working up other issues such as thyroid and patient reported she has an ultrasound scheduled we will continue with this plan at this time. Patient tends to use her trazodone and feels it's effective for sleep. She states she is able to enjoy her dog.  Chief Complaint:  Chief Complaint    Follow-up; Medication Refill     not eating Visit Diagnosis:     ICD-9-CM ICD-10-CM   1. Posttraumatic stress disorder 309.81 F43.10 traZODone (DESYREL) 100 MG tablet  2. Major depressive disorder, recurrent episode, moderate (HCC) 296.32 F33.1 traZODone (DESYREL) 100 MG tablet    Past Medical History:  Past Medical History  Diagnosis Date  . Migraine   . Mental retardation   . Posttraumatic stress disorder   . Depression   . Hepatic steatosis   . Splenomegaly   . Elevated liver enzymes   . Uterine fibroid   . Endometriosis   . Chronic back pain   . Hypothyroid   . Chronic fatigue syndrome   . Scoliosis   . PUD (peptic ulcer disease)     Past Surgical  History  Procedure Laterality Date  . Back surgery    . Laparoscopic endometriosis fulguration  2015    [name]  . Colonoscopy  04/27/2006    Wohl-colitis, internal hemorrhoids  . Esophagogastroduodenoscopy  02/2013    multiple peptic & duodenal ulcers, negative h pylori  . Colonoscopy  02/2013    focal right colitis, left colon biopsy negative, focal active proctitis without chronicity   Family History:  Family History  Problem Relation Age of Onset  . Breast cancer Mother   . COPD Father   . Lung cancer Father   . Dementia Father   . Diabetes Sister   . Diabetes Brother   . Ovarian cancer Paternal Aunt   . Colon cancer Maternal Grandmother   . Liver disease Maternal Aunt     ?etiology   Social History:  Social History   Social History  . Marital Status: Single    Spouse Name: N/A  . Number of Children: 0  . Years of Education: N/A   Social History Main Topics  . Smoking status: Never Smoker   . Smokeless tobacco: Never Used  . Alcohol Use: No  . Drug Use: No  . Sexual Activity: No   Other Topics Concern  . None   Social History Narrative   She has brother, half brother & sister live nearby   Both parents deceased   Care managed by [name]   Additional History:   Assessment:   Musculoskeletal: Strength & Muscle Tone: within normal limits Gait &  Station: normal Patient leans: N/A  Psychiatric Specialty Exam: Depression        Associated symptoms include does not have insomnia and no suicidal ideas.   Review of Systems  Endo/Heme/Allergies:       Patient reports that have an ultrasound of her thyroid.  Psychiatric/Behavioral: Negative for depression, suicidal ideas, hallucinations, memory loss and substance abuse. The patient is not nervous/anxious and does not have insomnia.   All other systems reviewed and are negative.   Blood pressure 122/98, pulse 85, temperature 97.9 F (36.6 C), temperature source Tympanic, height 5\' 3"  (1.6 m), weight 198  lb (89.812 kg), SpO2 98 %.Body mass index is 35.08 kg/(m^2).  General Appearance: Fairly Groomed  Eye Contact:  Fair  Speech:  Clear and Coherent and Normal Rate more talkative than in last appointments.   Volume:  Normal  Mood:  Good  Affect:  Congruent smiled when discussing her dog  Thought Process:  Concrete  Orientation:  Full (Time, Place, and Person)  Thought Content:  Negative  Suicidal Thoughts:  No  Homicidal Thoughts:  No  Memory:  Immediate;   Good Recent;   Good Remote;   Good  Judgement:  Fair  Insight:  Fair  Psychomotor Activity:  Negative  Concentration:  Good  Recall:  Good  Fund of Knowledge: Fair  Language: Fair  Akathisia:  Negative  Handed:  Right unknown  AIMS (if indicated):  Not done  Assets:  Social Support  ADL's:  Intact  Cognition: Impaired,  Moderate  Sleep:  Good    Is the patient at risk to self?  No. Has the patient been a risk to self in the past 6 months?  No. Has the patient been a risk to self within the distant past?  No. Is the patient a risk to others?  No. Has the patient been a risk to others in the past 6 months?  No. Has the patient been a risk to others within the distant past?  No.  Current Medications: Current Outpatient Prescriptions  Medication Sig Dispense Refill  . Butalbital-APAP-Caffeine 50-300-40 MG CAPS Take by mouth.    . Cholecalciferol (D 1000) 1000 UNITS capsule Take by mouth.    . diazepam (VALIUM) 5 MG tablet Insert one tablet into the vagina at bedtime    . docusate sodium (COLACE) 100 MG capsule Take by mouth.    . EPINEPHrine (EPIPEN 2-PAK) 0.3 mg/0.3 mL IJ SOAJ injection Inject into the muscle.    . hydrOXYzine (VISTARIL) 50 MG capsule Take by mouth.    Marland Kitchen leuprolide (LUPRON DEPOT) 11.25 MG injection Inject into the muscle.    . naproxen (NAPROSYN) 500 MG tablet Take 1 tablet (500 mg total) by mouth 2 (two) times daily with a meal. 20 tablet 2  . Norethin-Eth Estradiol-Fe [facility] FE,WYMZYA Orrin Brigham) 0.4-35 MG-MCG tablet Chew by mouth.    Marland Kitchen omeprazole (PRILOSEC) 20 MG capsule Take by mouth.    . promethazine (PHENERGAN) 12.5 MG tablet Take by mouth.    . sertraline (ZOLOFT) 50 MG tablet Take 1.5 tablets (75 mg total) by mouth daily. 45 tablet 2  . SUMAtriptan (IMITREX) 50 MG tablet Take by mouth.    . topiramate (TOPAMAX) 50 MG tablet Take by mouth.    . traMADol (ULTRAM) 50 MG tablet Take 50 mg by mouth every 6 (six) hours as needed. For pain.    . traZODone (DESYREL) 100 MG tablet Take 1 tablet (100 mg total) by mouth at bedtime.  30 tablet 3  . Vitamin D, Ergocalciferol, (DRISDOL) 50000 UNITS CAPS capsule Take by mouth.     No current facility-administered medications for this visit.    Medical Decision Making:  Established Problem, Stable/Improving (1)  Treatment Plan Summary:Medication management   Post traumatic stress disorder-as discussed about primary care is discontinued her sertraline and started Cymbalta 20 mg daily yesterday.  Major depressive disorder, recurrent, moderate- Cymbalta has been started as above. Patient continues to use trazodone with good benefit  Patient will return in 1 month. I will make some attempt to reach Dr. Chancy Milroy in regards to her decisions to discontinue patient's medications.  Teresa Crawford 01/18/2015, 11:08 AM
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
## 2015-01-21 ENCOUNTER — Ambulatory Visit: Payer: Medicare Other | Admitting: Physical Therapy

## 2015-01-21 DIAGNOSIS — Z7409 Other reduced mobility: Secondary | ICD-10-CM | POA: Diagnosis not present

## 2015-01-21 DIAGNOSIS — IMO0001 Reserved for inherently not codable concepts without codable children: Secondary | ICD-10-CM

## 2015-01-21 DIAGNOSIS — R531 Weakness: Secondary | ICD-10-CM | POA: Diagnosis not present

## 2015-01-21 DIAGNOSIS — M791 Myalgia: Secondary | ICD-10-CM | POA: Diagnosis not present

## 2015-01-21 DIAGNOSIS — R279 Unspecified lack of coordination: Secondary | ICD-10-CM

## 2015-01-21 DIAGNOSIS — M609 Myositis, unspecified: Secondary | ICD-10-CM | POA: Diagnosis not present

## 2015-01-21 NOTE — Patient Instructions (Addendum)
Add stretches after walking :   1. figure -4 stretch seated         2. windshield wipers ( rocking knees side to side while laying on your back , knees bent    Relief menstrual pain: Put on heating pad, breath 20 mins/ hour    Keeping walking with the shoes lift in R shoe  (good for losing weight) Keep up stretches and scar massage on back daily

## 2015-01-22 NOTE — Therapy (Addendum)
[place] MAIN [facility] SERVICES [address] [place], [place], [address] Phone: [phone]   Fax:  [phone]  Physical Therapy Treatment  Patient Details  Name: Teresa Crawford MRN: [id number] Date of Birth: 1985-10-25 No Data Recorded  Encounter Date: 01/21/2015      PT End of Session - 01/22/15 1035    Visit Number 3   Number of Visits 12   Authorization Type g-code aat 10th visit   PT Start Time 0910   PT Stop Time 1010   PT Time Calculation (min) 60 min   Equipment Utilized During Treatment Other (comment)  1 cm shoe lift, large size in R shoe   Activity Tolerance Patient tolerated treatment well   Behavior During Therapy [facility] for tasks assessed/performed      Past Medical History  Diagnosis Date  . Migraine   . Mental retardation   . Posttraumatic stress disorder   . Depression   . Hepatic steatosis   . Splenomegaly   . Elevated liver enzymes   . Uterine fibroid   . Endometriosis   . Chronic back pain   . Hypothyroid   . Chronic fatigue syndrome   . Scoliosis   . PUD (peptic ulcer disease)     Past Surgical History  Procedure Laterality Date  . Back surgery    . Laparoscopic endometriosis fulguration  2015    [name]  . Colonoscopy  04/27/2006    Wohl-colitis, internal hemorrhoids  . Esophagogastroduodenoscopy  02/2013    multiple peptic & duodenal ulcers, negative h pylori  . Colonoscopy  02/2013    focal right colitis, left colon biopsy negative, focal active proctitis without chronicity    There were no vitals filed for this visit.  Visit Diagnosis:  Mobility impaired  Lack of coordination  Weakness  Myalgia and myositis      Subjective Assessment - 01/21/15 0930    Subjective Patient returns after 2 weeks and reported no pain with the inner thighs and pubic symphysis today and she feels recovered after her fall. Pt's MD recommended that she walks everyday for 1 hr. Pt has been doing so around the  block in the company of someone she know. Pt's aid provided a list of medications changes which PT has entered in her records. Pt's aid reports that her MD has taken her off her birth control pill / cream because pt stated the pill was "making her gain weight".  Pt stated she is on her period this morning and feels achy pain "like she is about to have a child and she does not like" and "like she has been cut  open" as she points to her lower abdomen area. Pt states she does not feel crampy.       Patient is accompained by: Family member  aid    Pertinent History Pt lives in a supported living in her apt with with 3 hrs of staffing per day.    Patient Stated Goals " Not being in pain and for my muscles to loosen "            [facility] PT Assessment - 01/22/15 1039    Palpation   SI assessment  no tenderness to deep palpation over pubic symphysis, adductors bilaterally   Palpation comment L sided LQ tenderness with palpation, and heel slide L                      [facility] Adult PT  Treatment/Exercise - 01/22/15 1031    Self-Care   Other Self-Care Comments  Updated new medication changes to her chart, educated about applying heat for 20 min to decrease menstrual pain. use of shoe lift  diaphragmatic breathing    Exercises   Other Exercises  see pt instruction (lower trunk rocking to create mobility in low abdominal area as a way to manage menstrual related pain)   reviewed stretches, pt recalled side bend stretch readily   Moist Heat Therapy   Number Minutes Moist Heat 10 Minutes (not billed)   Moist Heat Location Other (comment)  LQ abdomen                PT Education - 01/22/15 1104    Education provided Yes   Education Details HEP, ensure decreased risk for falls (questioned about rugs,having  nonskid material in bathroom where her fall occurred), educated aid on HEP as well.    Person(s) Educated Patient   Methods Explanation;Demonstration;Tactile cues;Verbal  cues;Handout   Comprehension Verbalized understanding;Returned demonstration             PT Long Term Goals - 01/22/15 1040    PT LONG TERM GOAL #1   Title Pt will decrease her score on [place] from 14% to < 12% in order to demo IND with managing pain.    Time 12   Period Weeks   Status On-going   PT LONG TERM GOAL #2   Title Pt will demo improved mobility of her longitudinal spinal scar in order to improve ROM to perform flexibility porgram for self care.    Time 12   Period Weeks   Status On-going   PT LONG TERM GOAL #3   Title Pt will demo decreased posterior pelvic mm tensions and tenderness in order to decrease pain and increase QOL.    Time 12   Period Weeks   Status On-going   PT LONG TERM GOAL #4   Title Pt will increase her gait speed from 1.0 m/s to 1.2 m/s  in order to cross the road safely when pt is in the community. (01/22/15: 1.39 m/s )    Time 12   Period Weeks   Status Achieved               Plan - 01/22/15 1050    Clinical Impression Statement Pt reported her abdominal pain decreased from 10/10 pre-Tx to a 3/10 post-Tx.  Pt's gait speed increased to community gait speed when wearing the shoe lift in R (1 cm). Pt also showed decreased R trunk sway and reported increased comfort with walking. Anticipate shoe lift will help her sustain her walking routine as it addressed her leg length imbalance secondary to scoliosis.  Pt's suprapubic pain from last visit (due to her fall) resolved with no pain upon palpation.  Pt continues to benefit from skilled pelvic health  PT and is progressing well towards her goals with achievement of one of her goals today.   Plan to educate pt on an abdominal massage to better manage future menstrual related pain and to address L LQ  with manual therapy where she reported pain and Hx of endometriosis.    Pt will benefit from skilled therapeutic intervention in order to improve on the following deficits Decreased cognition;Decreased  coordination;Decreased endurance;Decreased scar mobility;Increased fascial restrictions;Pain;Decreased safety awareness;Decreased range of motion;Decreased mobility;Impaired flexibility;Difficulty walking;Postural dysfunction;Increased muscle spasms;Improper body mechanics;Abnormal gait   PT Frequency 1 / week   PT Duration 12 weeks  PT Treatment/Interventions ADL/Self Care Home Management;Cryotherapy;Moist Heat;Gait training;Stair training;Functional mobility training;Therapeutic activities;Therapeutic exercise;Balance training;Neuromuscular re-education;Patient/family education;Orthotic Fit/Training;Scar mobilization;Passive range of motion;Dry needling;Energy conservation;Taping;Manual techniques   Consulted and Agree with Plan of Care Patient   Family Member Consulted aid        Problem List Patient Active Problem List   Diagnosis Date Noted  . Major depressive disorder, recurrent episode, moderate ([place]) 10/29/2014  . PTSD (post-traumatic stress disorder) 10/29/2014  . Grief 10/29/2014  . CFIDS (chronic fatigue and immune dysfunction syndrome) 09/28/2014  . Aggrieved 09/28/2014  . Depression, major, recurrent ([place]) 09/28/2014  . Intellectual disability 09/28/2014  . Neurosis, posttraumatic 09/28/2014  . History of colitis 09/22/2014  . Hepatic steatosis 09/20/2014  . Endometriosis 08/30/2013    Jerl Mina  ,PT, DPT, E-RYT  01/22/2015, 11:06 AM  [place] MAIN [facility] SERVICES [address] [place], [place], [address] Phone: [phone]   Fax:  [phone]  Name: SKY PRIMO MRN: [id number] Date of Birth: February 22, 1986

## 2015-01-23 DIAGNOSIS — E041 Nontoxic single thyroid nodule: Secondary | ICD-10-CM | POA: Diagnosis not present

## 2015-01-28 ENCOUNTER — Ambulatory Visit: Payer: Medicare Other | Admitting: Physical Therapy

## 2015-01-28 DIAGNOSIS — M791 Myalgia: Secondary | ICD-10-CM | POA: Diagnosis not present

## 2015-01-28 DIAGNOSIS — R279 Unspecified lack of coordination: Secondary | ICD-10-CM | POA: Diagnosis not present

## 2015-01-28 DIAGNOSIS — R531 Weakness: Secondary | ICD-10-CM

## 2015-01-28 DIAGNOSIS — Z7409 Other reduced mobility: Secondary | ICD-10-CM | POA: Diagnosis not present

## 2015-01-28 DIAGNOSIS — M609 Myositis, unspecified: Secondary | ICD-10-CM | POA: Diagnosis not present

## 2015-01-28 DIAGNOSIS — IMO0001 Reserved for inherently not codable concepts without codable children: Secondary | ICD-10-CM

## 2015-01-28 NOTE — Patient Instructions (Addendum)
Strengthening L low back curve:  1. "[place]"   PNF D2 extension   Yellow band  10 x rep x 2 day    2. Modified side plank at kitchen counter L arm only  5 sec holds x 10 reps x 2 day     3. Proper sitting with feet under knees     (hand drawn pictures added)    4. Hand press by side when sitting L arm only when sitting 5 sec holds x 10 reps x 2 day       4. Sidestepping L and R down hallway   2 laps each way

## 2015-01-29 NOTE — Therapy (Signed)
[place] MAIN [facility] SERVICES [address] [place], [place], [address] Phone: [phone]   Fax:  [phone]  Physical Therapy Treatment  Patient Details  Name: Teresa Crawford MRN: [id number] Date of Birth: [birth year] Referring Provider: Illene Bolus, MD (Dr. William Hamburger)   Encounter Date: 01/28/2015      PT End of Session - 01/29/15 1505    Visit Number 4   Number of Visits 12   Authorization Type g-code aat 10th visit   PT Start Time 0912   PT Stop Time 1010   PT Time Calculation (min) 58 min   Equipment Utilized During Treatment Other (comment)  1 cm shoe lift, large size in R shoe   Activity Tolerance Patient tolerated treatment well   Behavior During Therapy [facility] for tasks assessed/performed      Past Medical History  Diagnosis Date  . Migraine   . Mental retardation   . Posttraumatic stress disorder   . Depression   . Hepatic steatosis   . Splenomegaly   . Elevated liver enzymes   . Uterine fibroid   . Endometriosis   . Chronic back pain   . Hypothyroid   . Chronic fatigue syndrome   . Scoliosis   . PUD (peptic ulcer disease)     Past Surgical History  Procedure Laterality Date  . Back surgery    . Laparoscopic endometriosis fulguration  2015    [name]  . Colonoscopy  04/27/2006    Wohl-colitis, internal hemorrhoids  . Esophagogastroduodenoscopy  02/2013    multiple peptic & duodenal ulcers, negative h pylori  . Colonoscopy  02/2013    focal right colitis, left colon biopsy negative, focal active proctitis without chronicity    There were no vitals filed for this visit.  Visit Diagnosis:  Lack of coordination  Weakness  Myalgia and myositis  Mobility impaired      Subjective Assessment - 01/28/15 0918    Subjective Pt has reported she is able to walking with her fiance a longer distance in her nieghborhood and at the [place] after last session with the shoe lift inher shoe. Pt stated she "was proud of  herself". Pt's care taker reports she has not been complaining of being in as much pain. Pt reported using heat and gentle exercises helped relieved menstrual pain. Pt reported she has been doing her stretches.     Patient is accompained by: Family member  aid    Pertinent History Pt lives in a supported living in her apt with with 3 hrs of staffing per day.    Patient Stated Goals " Not being in pain and for my muscles to loosen "   Currently in Pain? No/denies   Pain Score 0-No pain            [facility] PT Assessment - 01/29/15 1500    Assessment   Medical Diagnosis pelvic pain   Referring Provider Illene Bolus, MD (Dr. William Hamburger)    Observation/Other Assessments   Other Surveys  --  Pain Disability Index: 3%    Palpation   SI assessment  --   Palpation comment no tenderness to posterior pelvic floor mm (external palpation)                      [facility] Adult PT Treatment/Exercise - 01/29/15 1503    Neuro Re-ed    Neuro Re-ed Details  see pt instructions to strengthening L sided concave side of lumbar  scoliotic curve   Exercises   Other Exercises  see patient instructions                     PT Long Term Goals - 01/28/15 9528    PT LONG TERM GOAL #1   Title Pt will decrease her score on [place] from 14% to < 12% in order to demo IND with managing pain. (01/28/15: 3%)    Time 12   Period Weeks   Status Achieved   PT LONG TERM GOAL #2   Title Pt will demo improved mobility of her longitudinal spinal scar in order to improve ROM to perform flexibility porgram for self care.    Time 12   Period Weeks   Status Achieved   PT LONG TERM GOAL #3   Title Pt will demo decreased posterior pelvic mm tensions and tenderness in order to decrease pain and increase QOL.    Time 12   Period Weeks   Status Achieved   PT LONG TERM GOAL #4   Title Pt will increase her gait speed from 1.0 m/s to 1.2 m/s  in order to cross the road safely when pt is in the community. (01/22/15:  1.39 m/s )    Time 12   Period Weeks   Status Achieved               Plan - 01/29/15 1505    Clinical Impression Statement Pt is progressing well towards her goals. Pt's Pain Disability Index score decreased to 3% from 14% indicating significant improvement with her pain. Pt continues to show improved gait without report of pain with R shoelift. Pt will be ready for D/C at next visit in order to review strengthening HEP for pt's L lumbar scoliotic curve (concave side).     Pt will benefit from skilled therapeutic intervention in order to improve on the following deficits Decreased cognition;Decreased coordination;Decreased endurance;Decreased scar mobility;Increased fascial restricitons;Pain;Decreased safety awareness;Decreased range of motion;Decreased mobility;Impaired flexibility;Difficulty walking;Postural dysfunction;Increased muscle spasms;Improper body mechanics;Abnormal gait   Rehab Potential Good   PT Frequency 1x / week   PT Duration 12 weeks   PT Treatment/Interventions ADLs/Self Care Home Management;Cryotherapy;Moist Heat;Gait training;Stair training;Functional mobility training;Therapeutic activities;Therapeutic exercise;Balance training;Neuromuscular re-education;Patient/family education;Orthotic Fit/Training;Scar mobilization;Passive range of motion;Dry needling;Energy conservation;Taping;Manual techniques   Consulted and Agree with Plan of Care Patient   Family Member Consulted aid        Problem List Patient Active Problem List   Diagnosis Date Noted  . Major depressive disorder, recurrent episode, moderate ([place]) 10/29/2014  . PTSD (post-traumatic stress disorder) 10/29/2014  . Grief 10/29/2014  . CFIDS (chronic fatigue and immune dysfunction syndrome) 09/28/2014  . Aggrieved 09/28/2014  . Depression, major, recurrent ([place]) 09/28/2014  . Intellectual disability 09/28/2014  . Neurosis, posttraumatic 09/28/2014  . History of colitis 09/22/2014  . Hepatic  steatosis 09/20/2014  . Endometriosis 08/30/2013    Jerl Mina  ,PT, DPT, E-RYT  01/29/2015, 3:09 PM  [place] MAIN [facility] SERVICES [address] [place], [place], [address] Phone: [phone]   Fax:  [phone]  Name: Teresa Crawford MRN: [id number] Date of Birth: [birth year]

## 2015-01-30 ENCOUNTER — Encounter: Payer: Self-pay | Admitting: Licensed Clinical Social Worker

## 2015-01-31 DIAGNOSIS — F331 Major depressive disorder, recurrent, moderate: Secondary | ICD-10-CM | POA: Diagnosis not present

## 2015-01-31 DIAGNOSIS — G43019 Migraine without aura, intractable, without status migrainosus: Secondary | ICD-10-CM | POA: Diagnosis not present

## 2015-01-31 DIAGNOSIS — F5101 Primary insomnia: Secondary | ICD-10-CM | POA: Diagnosis not present

## 2015-01-31 DIAGNOSIS — D510 Vitamin B12 deficiency anemia due to intrinsic factor deficiency: Secondary | ICD-10-CM | POA: Diagnosis not present

## 2015-01-31 DIAGNOSIS — R7301 Impaired fasting glucose: Secondary | ICD-10-CM | POA: Diagnosis not present

## 2015-01-31 DIAGNOSIS — R112 Nausea with vomiting, unspecified: Secondary | ICD-10-CM | POA: Diagnosis not present

## 2015-02-01 ENCOUNTER — Ambulatory Visit: Payer: Medicare Other | Admitting: Licensed Clinical Social Worker

## 2015-02-04 ENCOUNTER — Ambulatory Visit: Payer: Medicare Other | Admitting: Physical Therapy

## 2015-02-04 DIAGNOSIS — R531 Weakness: Secondary | ICD-10-CM | POA: Diagnosis not present

## 2015-02-04 DIAGNOSIS — Z7409 Other reduced mobility: Secondary | ICD-10-CM

## 2015-02-04 DIAGNOSIS — IMO0001 Reserved for inherently not codable concepts without codable children: Secondary | ICD-10-CM

## 2015-02-04 DIAGNOSIS — M609 Myositis, unspecified: Secondary | ICD-10-CM | POA: Diagnosis not present

## 2015-02-04 DIAGNOSIS — R279 Unspecified lack of coordination: Secondary | ICD-10-CM

## 2015-02-04 DIAGNOSIS — M791 Myalgia: Secondary | ICD-10-CM | POA: Diagnosis not present

## 2015-02-04 NOTE — Therapy (Deleted)
[place] MAIN [facility] SERVICES [address] [place], [place], [address] Phone: [phone]   Fax:  [phone]  Patient Details  Name: BETHANIE BLOXOM MRN: [id number] Date of Birth: September 26, 1985 Referring Provider:  Illene Bolus, MD  Encounter Date: 02/04/2015   Jerl Mina ,PT, DPT, E-RYT  02/04/2015, 7:36 PM  [place] MAIN [facility] SERVICES [address] [place], [place], [address] Phone: [phone]   Fax:  [phone]

## 2015-02-06 NOTE — Therapy (Addendum)
[place] MAIN [facility] SERVICES [address] [place], [place], [address] Phone: [phone]   Fax:  [phone]  Physical Therapy Treatment [place] Summary  Patient Details  Name: Teresa Crawford MRN: [id number] Date of Birth: 1985/04/17 Referring Provider: Illene Bolus  Encounter Date: 02/04/2015    Past Medical History  Diagnosis Date  . Migraine   . Mental retardation   . Posttraumatic stress disorder   . Depression   . Hepatic steatosis   . Splenomegaly   . Elevated liver enzymes   . Uterine fibroid   . Endometriosis   . Chronic back pain   . Hypothyroid   . Chronic fatigue syndrome   . Scoliosis   . PUD (peptic ulcer disease)     Past Surgical History  Procedure Laterality Date  . Back surgery    . Laparoscopic endometriosis fulguration  2015    [name]  . Colonoscopy  04/27/2006    Wohl-colitis, internal hemorrhoids  . Esophagogastroduodenoscopy  02/2013    multiple peptic & duodenal ulcers, negative h pylori  . Colonoscopy  02/2013    focal right colitis, left colon biopsy negative, focal active proctitis without chronicity    There were no vitals filed for this visit.  Visit Diagnosis:  Lack of coordination  Weakness  Myalgia and myositis  Mobility impaired      Subjective Assessment - 02/06/15 1531    Subjective Pt reported she went on a hike (with up/down hills) and felt "energized" afterwards. Pt has not had any pain for the past two weeks. Pt reported she feels "A Very Great Deal Better "7") on the GROC. Pt reports she has had L arm numbness and weakness since she back surgery at 16 years ago.  Then the arm Sx worsened after she experienced  two seizers 4 years ago. Pt  is interested in getting PT for her L arm  because she is concerned about the lack of sensation in the upper and lower arm. Pt  has difficulty with opening jars, and dropping objects when holding it.      Patient is accompained by: Family  member  aid    Pertinent History Pt lives in a supported living in her apt with with 3 hrs of staffing per day.    Patient Stated Goals " Not being in pain and for my muscles to loosen "            [facility] PT Assessment - 02/06/15 1532    Assessment   Medical Diagnosis pelvic pain   Referring Provider Illene Bolus   Observation/Other Assessments   Other Surveys  --  DASH-    Sensation   Light Touch Impaired Details --  C4-T1 on L decreased compared to R    AROM   Overall AROM  Within functional limits for tasks performed  R shoulder   Strength   Overall Strength --  shoulder elevation flexion, ext, scaption, ER/IR  4/5, L 5/5   Palpation   Palpation comment no tenderness to posterior pelvic floor mm (external palpation)                      [facility] Adult PT Treatment/Exercise - 02/06/15 1533    Self-Care   Other Self-Care Comments  d/c and maintenance of HEP to minimize pelvic and menstrual related pain   Therapeutic Activites    Therapeutic Activities --  assessed R shoulder myotome and sensation   Exercises   Other  Exercises  reviewed HEP, required minor cuing                 PT Education - 02/06/15 1536    Education provided Yes   Education Details HEP, general assessment of shoulder, explained d/c from pelvic pain case and refer for an order to treat shoulder pain    Person(s) Educated Patient   Methods Demonstration;Tactile cues;Verbal cues;Explanation   Comprehension Verbalized understanding;Returned demonstration             PT Long Term Goals - 02/06/15 1531    PT LONG TERM GOAL #1   Title Pt will decrease her score on [place] from 14% to < 12% in order to demo IND with managing pain. (01/28/15: 3%)    Time 12   Period Weeks   Status Achieved   PT LONG TERM GOAL #2   Title Pt will demo improved mobility of her longitudinal spinal scar in order to improve ROM to perform flexibility porgram for self care.    Time 12   Period Weeks    Status Achieved   PT LONG TERM GOAL #3   Title Pt will demo decreased posterior pelvic mm tensions and tenderness in order to decrease pain and increase QOL.    Time 12   Period Weeks   Status Achieved   PT LONG TERM GOAL #4   Title Pt will increase her gait speed from 1.0 m/s to 1.2 m/s  in order to cross the road safely when pt is in the community. (01/22/15: 1.39 m/s )    Time 12   Period Weeks   Status Achieved               Plan - 02/06/15 1542    Clinical Impression Statement Pt has met 100% of her goals with a report of feeling a "Very Great Deal Better" with her pain. Pt demo'd increased walking speed and  less gait deviations with use of R shoe lift. Pt also showed  significantly decreased mm tensions in back and pelvic floor with compliance to stretching and strengthening routine customized for her scoliotic spine. Pt demo'd compliance to relaxation and pain management techniques such as use of heat pad and gentle ROM exercises in order to manage menstrual -related pain. Pt is ready to be d/c at this time. Pt would benefit from an order to treat her R shoulder as she was concerned about the numbness and weakness she experiences.  General assessment showed decreased sensation on L dermatomes and decreased strength in L myotomes in UE compared to R.  Pt reported she had this complaint since her back surgery.  Further assessment under a MD order would be beneficial to help improve pt's deficits that impact her ability to lift objects and screw jars.  Thank you for your referral!    Pt will benefit from skilled therapeutic intervention in order to improve on the following deficits Decreased cognition;Decreased coordination;Decreased endurance;Decreased scar mobility;Increased fascial restrictions;Pain;Decreased safety awareness;Decreased range of motion;Decreased mobility;Impaired flexibility;Difficulty walking;Postural dysfunction;Increased muscle spasms;Improper body mechanics;Abnormal  gait   Rehab Potential Good   PT Frequency 1x / week   PT Duration 12 weeks   PT Treatment/Interventions ADLs/Self Care Home Management;Cryotherapy;Moist Heat;Gait training;Stair training;Functional mobility training;Therapeutic activities;Therapeutic exercise;Balance training;Neuromuscular re-education;Patient/family education;Orthotic Fit/Training;Scar mobilization;Passive range of motion;Dry needling;Energy conservation;Taping;Manual techniques   Consulted and Agree with Plan of Care Patient   Family Member Consulted aid        Problem List Patient Active Problem List  Diagnosis Date Noted  . Major depressive disorder, recurrent episode, moderate ([place]) 10/29/2014  . PTSD (post-traumatic stress disorder) 10/29/2014  . Grief 10/29/2014  . CFIDS (chronic fatigue and immune dysfunction syndrome) 09/28/2014  . Aggrieved 09/28/2014  . Depression, major, recurrent ([place]) 09/28/2014  . Intellectual disability 09/28/2014  . Neurosis, posttraumatic 09/28/2014  . History of colitis 09/22/2014  . Hepatic steatosis 09/20/2014  . Endometriosis 08/30/2013    Jerl Mina ,PT, DPT, E-RYT  02/06/2015, 3:50 PM  [place] MAIN [facility] SERVICES [address] [place], [place], [address] Phone: [phone]   Fax:  [phone]  Name: HAN LYSNE MRN: [id number] Date of Birth: 12-01-1985

## 2015-02-08 ENCOUNTER — Ambulatory Visit: Payer: Self-pay | Admitting: Licensed Clinical Social Worker

## 2015-02-15 ENCOUNTER — Ambulatory Visit: Payer: Medicare Other | Admitting: Physical Therapy

## 2015-02-18 ENCOUNTER — Ambulatory Visit: Payer: Medicare Other | Admitting: Physical Therapy

## 2015-02-19 ENCOUNTER — Ambulatory Visit: Payer: Self-pay | Admitting: Psychiatry

## 2015-02-19 DIAGNOSIS — J309 Allergic rhinitis, unspecified: Secondary | ICD-10-CM | POA: Diagnosis not present

## 2015-02-19 DIAGNOSIS — H65111 Acute and subacute allergic otitis media (mucoid) (sanguinous) (serous), right ear: Secondary | ICD-10-CM | POA: Diagnosis not present

## 2015-02-20 ENCOUNTER — Ambulatory Visit: Payer: Medicare Other | Admitting: Physical Therapy

## 2015-02-22 ENCOUNTER — Encounter: Payer: Self-pay | Admitting: Psychiatry

## 2015-02-22 ENCOUNTER — Ambulatory Visit (INDEPENDENT_AMBULATORY_CARE_PROVIDER_SITE_OTHER): Payer: Medicare Other | Admitting: Psychiatry

## 2015-02-22 VITALS — BP 118/68 | HR 79 | Temp 97.7°F | Ht 63.0 in | Wt 194.6 lb

## 2015-02-22 DIAGNOSIS — F4321 Adjustment disorder with depressed mood: Secondary | ICD-10-CM

## 2015-02-22 DIAGNOSIS — F4310 Post-traumatic stress disorder, unspecified: Secondary | ICD-10-CM

## 2015-02-22 DIAGNOSIS — F331 Major depressive disorder, recurrent, moderate: Secondary | ICD-10-CM

## 2015-02-22 NOTE — Progress Notes (Signed)
[place] MD/PA/NP OP Progress Note  02/22/2015 2:15 PM Teresa Crawford  MRN:  [id number]  Subjective:  Follow-up of her posttraumatic stress disorder and major depressive disorder, recurrent moderate. At the last visit 1 month ago her primary care and stopped her sertraline, Remeron and started her on Cymbalta. Patient is now been on the Cymbalta about a month. She reports tolerating it well and denies any side effects from it. I clarified with them who is going to be writing for this medication and it appears the primary care will be writing for it because her primary care is also using it to treat chronic fatigue syndrome. Thus at this time I am only writing for the trazodone.  Patient states that she continues to sleep well. She is pleased that since being off the other medications sertraline and Remeron she is beginning to lose weight although not as much as she would like. She presents with a another staff member from her supervised living facility. Staff member reports she is doing pretty well and participating in activities. Patient on her own volition put up a Christmas tree. Patient does discuss playing with her dog. However patient does state she still grieving the death of her father and at times can have crying episodes from that but estimates it might be occurring about 3 times a week. Chief Complaint: Doing okay Chief Complaint    Follow-up; Medication Refill     not eating Visit Diagnosis:     ICD-9-CM ICD-10-CM   1. Major depressive disorder, recurrent episode, moderate (HCC) 296.32 F33.1   2. PTSD (post-traumatic stress disorder) 309.81 F43.10   3. Grief 309.0 F43.21     Past Medical History:  Past Medical History  Diagnosis Date  . Migraine   . Mental retardation   . Posttraumatic stress disorder   . Depression   . Hepatic steatosis   . Splenomegaly   . Elevated liver enzymes   . Uterine fibroid   . Endometriosis   . Chronic back pain   . Hypothyroid   . Chronic fatigue  syndrome   . Scoliosis   . PUD (peptic ulcer disease)     Past Surgical History  Procedure Laterality Date  . Back surgery    . Laparoscopic endometriosis fulguration  2015    [name]  . Colonoscopy  04/27/2006    Wohl-colitis, internal hemorrhoids  . Esophagogastroduodenoscopy  02/2013    multiple peptic & duodenal ulcers, negative h pylori  . Colonoscopy  02/2013    focal right colitis, left colon biopsy negative, focal active proctitis without chronicity   Family History:  Family History  Problem Relation Age of Onset  . Breast cancer Mother   . COPD Father   . Lung cancer Father   . Dementia Father   . Diabetes Sister   . Diabetes Brother   . Ovarian cancer Paternal Aunt   . Colon cancer Maternal Grandmother   . Liver disease Maternal Aunt     ?etiology   Social History:  Social History   Social History  . Marital Status: Single    Spouse Name: N/A  . Number of Children: 0  . Years of Education: N/A   Social History Main Topics  . Smoking status: Never Smoker   . Smokeless tobacco: Never Used  . Alcohol Use: No  . Drug Use: No  . Sexual Activity: No   Other Topics Concern  . None   Social History Narrative   She has brother, half brother &  sister live nearby   Both parents deceased   Care managed by [name]   Additional History:   Assessment:   Musculoskeletal: Strength & Muscle Tone: within normal limits Gait & Station: normal Patient leans: N/A  Psychiatric Specialty Exam: Depression        Associated symptoms include does not have insomnia and no suicidal ideas.   Review of Systems  Endo/Heme/Allergies:       Patient reports that have an ultrasound of her thyroid.  Psychiatric/Behavioral: Negative for depression, suicidal ideas, hallucinations, memory loss and substance abuse. The patient is not nervous/anxious and does not have insomnia.   All other systems reviewed and are negative.   Blood pressure 118/68, pulse 79, temperature 97.7  F (36.5 C), temperature source Tympanic, height 5\' 3"  (1.6 m), weight 194 lb 9.6 oz (88.27 kg), last menstrual period 02/15/2015, SpO2 97 %.Body mass index is 34.48 kg/(m^2).  General Appearance: Fairly Groomed  Eye Contact:  Fair  Speech:  Clear and Coherent and Normal Rate more talkative than in last appointments.   Volume:  Normal  Mood:  Good  Affect:  Congruent able to smile and certainly more talkative than in past sessions   Thought Process:  Concrete  Orientation:  Full (Time, Place, and Person)  Thought Content:  Negative  Suicidal Thoughts:  No  Homicidal Thoughts:  No  Memory:  Immediate;   Good Recent;   Good Remote;   Good  Judgement:  Fair  Insight:  Fair  Psychomotor Activity:  Negative  Concentration:  Good  Recall:  Good  Fund of Knowledge: Fair  Language: Fair  Akathisia:  Negative  Handed:  Right unknown  AIMS (if indicated):  Not done  Assets:  Social Support  ADL's:  Intact  Cognition: Impaired,  Moderate  Sleep:  Good    Is the patient at risk to self?  No. Has the patient been a risk to self in the past 6 months?  No. Has the patient been a risk to self within the distant past?  No. Is the patient a risk to others?  No. Has the patient been a risk to others in the past 6 months?  No. Has the patient been a risk to others within the distant past?  No.  Current Medications: Current Outpatient Prescriptions  Medication Sig Dispense Refill  . Butalbital-APAP-Caffeine 50-300-40 MG CAPS Take by mouth.    . Cholecalciferol (D 1000) 1000 UNITS capsule Take by mouth.    . diazepam (VALIUM) 5 MG tablet Insert one tablet into the vagina at bedtime    . docusate sodium (COLACE) 100 MG capsule Take by mouth.    . DULoxetine (CYMBALTA) 20 MG capsule Take 20 mg by mouth daily.    Marland Kitchen EPINEPHrine (EPIPEN 2-PAK) 0.3 mg/0.3 mL IJ SOAJ injection Inject into the muscle.    . fexofenadine (ALLEGRA) 180 MG tablet Take 180 mg by mouth daily.    . fluticasone (FLONASE)  50 MCG/ACT nasal spray Place 1 spray into both nostrils daily.    Marland Kitchen ibuprofen (ADVIL,MOTRIN) 600 MG tablet Take 600 mg by mouth every 6 (six) hours as needed.    Marland Kitchen leuprolide (LUPRON DEPOT) 11.25 MG injection Inject into the muscle.    . naproxen (NAPROSYN) 500 MG tablet Take 1 tablet (500 mg total) by mouth 2 (two) times daily with a meal. 20 tablet 2  . Norethin-Eth Estradiol-Fe [facility] FE,WYMZYA Orrin Brigham) 0.4-35 MG-MCG tablet Chew by mouth.    Marland Kitchen omeprazole (PRILOSEC)  20 MG capsule Take by mouth.    . ondansetron (ZOFRAN) 8 MG tablet Take 8 mg by mouth every 8 (eight) hours as needed for nausea or vomiting.    . promethazine (PHENERGAN) 12.5 MG tablet Take by mouth.    . SUMAtriptan (IMITREX) 50 MG tablet Take by mouth.    . topiramate (TOPAMAX) 50 MG tablet Take 25 mg by mouth.     . traMADol (ULTRAM) 50 MG tablet Take 100 mg by mouth every 6 (six) hours as needed. For pain.    . traZODone (DESYREL) 100 MG tablet Take 1 tablet (100 mg total) by mouth at bedtime. 30 tablet 3  . Vitamin D, Ergocalciferol, (DRISDOL) 50000 UNITS CAPS capsule Take by mouth.     No current facility-administered medications for this visit.    Medical Decision Making:  Established Problem, Stable/Improving (1)  Treatment Plan Summary:Medication management   Post traumatic stress disorder-as discussed about primary care is discontinued her sertraline and started Cymbalta 20 mg daily yesterday.  Major depressive disorder, recurrent, moderate- Cymbalta has been started as above. Patient continues to use trazodone with good benefit.  Patient is also on Valium prescribed by another doctor for pelvic muscle spasms.  Patient will return in  2 months.  Faith Rogue 02/22/2015, 2:15 PM

## 2015-02-25 ENCOUNTER — Encounter: Payer: Medicare Other | Admitting: Physical Therapy

## 2015-02-25 DIAGNOSIS — F330 Major depressive disorder, recurrent, mild: Secondary | ICD-10-CM | POA: Diagnosis not present

## 2015-02-25 DIAGNOSIS — F5101 Primary insomnia: Secondary | ICD-10-CM | POA: Diagnosis not present

## 2015-02-25 DIAGNOSIS — E6609 Other obesity due to excess calories: Secondary | ICD-10-CM | POA: Diagnosis not present

## 2015-02-25 DIAGNOSIS — D510 Vitamin B12 deficiency anemia due to intrinsic factor deficiency: Secondary | ICD-10-CM | POA: Diagnosis not present

## 2015-02-26 ENCOUNTER — Ambulatory Visit: Payer: Medicare Other | Attending: Obstetrics and Gynecology | Admitting: Physical Therapy

## 2015-02-26 DIAGNOSIS — Z7409 Other reduced mobility: Secondary | ICD-10-CM

## 2015-02-26 DIAGNOSIS — M791 Myalgia: Secondary | ICD-10-CM | POA: Diagnosis not present

## 2015-02-26 DIAGNOSIS — R29898 Other symptoms and signs involving the musculoskeletal system: Secondary | ICD-10-CM

## 2015-02-26 DIAGNOSIS — M609 Myositis, unspecified: Secondary | ICD-10-CM | POA: Diagnosis not present

## 2015-02-26 DIAGNOSIS — IMO0001 Reserved for inherently not codable concepts without codable children: Secondary | ICD-10-CM

## 2015-02-26 NOTE — Patient Instructions (Signed)
Sleeping on one pillow on back with rolled hand towel under L rib to fill in the curve. Pillow under knees. Try to stay sleeping on back   Wake morning routine with loosening neck routine :   Chin tuck, chin lift, open jaw   Rotate neck to R, L  Bend ear toward shoulder R and L  3 x each direction    Sit with chin tucked under - 10 reps 3 x day

## 2015-02-27 ENCOUNTER — Ambulatory Visit: Payer: Medicare Other | Admitting: Physical Therapy

## 2015-02-27 NOTE — Therapy (Signed)
[place] MAIN [facility] SERVICES [address] [place], [place], [address] Phone: [phone]   Fax:  [phone]  Physical Therapy Evaluation  Patient Details  Name: Teresa Crawford MRN: [id number] Date of Birth: [birth year] Referring Provider: Illene Bolus  Encounter Date: 02/26/2015    Past Medical History  Diagnosis Date  . Migraine   . Mental retardation   . Posttraumatic stress disorder   . Depression   . Hepatic steatosis   . Splenomegaly   . Elevated liver enzymes   . Uterine fibroid   . Endometriosis   . Chronic back pain   . Hypothyroid   . Chronic fatigue syndrome   . Scoliosis   . PUD (peptic ulcer disease)     Past Surgical History  Procedure Laterality Date  . Back surgery    . Laparoscopic endometriosis fulguration  2015    [name]  . Colonoscopy  04/27/2006    Wohl-colitis, internal hemorrhoids  . Esophagogastroduodenoscopy  02/2013    multiple peptic & duodenal ulcers, negative h pylori  . Colonoscopy  02/2013    focal right colitis, left colon biopsy negative, focal active proctitis without chronicity    There were no vitals filed for this visit.  Visit Diagnosis:  Mobility impaired - Plan: PT plan of care cert/re-cert  Weakness of left upper extremity - Plan: PT plan of care cert/re-cert  Myalgia and myositis - Plan: PT plan of care cert/re-cert      Subjective Assessment - 02/26/15 1429    Subjective Pt reported L shoulder / arm pain described as "pins and needles" that started since the age of 13-14 after back surgery for scoliosis.  Pt notices this sensation comes on once a week intermittently without specific tasks.  Pt is bothered by decreased sensation in L arm compared to R. Pt tries to itch an area on her upper and lower arm, she "can't feel it."      Patient is accompained by: Family member  aid    Pertinent History Pt lives in a supported living in her apt with with 3 hrs of staffing per day.    Patient Stated Goals get arm stronger and feeling back in it    Currently in Pain? No/denies            [facility] PT Assessment - 02/27/15 1032    Assessment   Medical Diagnosis L shoulder/arm pain   Precautions   Precautions None   Restrictions   Weight Bearing Restrictions No   Home Environment   Additional Comments lives at an independent living facility with 3 hrs/ day of assistance   Prior Function   Level of Independence Independent with basic ADLs;Needs assistance with ADLs   Cognition   Overall Cognitive Status History of cognitive impairments - at baseline  aid helped to intepret questions, answer questionnaire    Observation/Other Assessments   Quick DASH  11.4    Sensation   Light Touch --  L UE < R UE C2-T1    Other:   Other/Comments functional reaching behind head and back WNL, no pain    AROM   Overall AROM  --  supine sidebend R ~50 deg , L ~60 deg, post-Tx bil ~60 deg    Strength   Overall Strength Comments UE L 3+/5 , R 5/5 overall,  Hand grip R :33 lb (avg of 3 trials, L 30 lb on avg  of 3 trials   post-Tx 4/5 L  Palpation   Spinal mobility R cervicothoracic curve 2/2 scoliosis   Palpation comment R subocciptal mm R increased tensions and tenderness   decreased tensions and tenderness on R post-Tx                           PT Education - 02/27/15 1040    Education provided Yes   Education Details HEP, POC, goals, anatomy and physiology   Person(s) Educated Patient  caretaker   Methods Explanation;Demonstration;Tactile cues;Verbal cues;Handout   Comprehension Returned demonstration             PT Long Term Goals - 02/27/15 1033    PT LONG TERM GOAL #1   Title Pt will decrease her QUick Dsh score from 11.4 to < 10 in order to increase functional use of L UE for ADLs.   Time 12   Period Weeks   Status New   PT LONG TERM GOAL #2   Title Pt will demo decreased tensions and tenderness along suboccipital mm on R in order to  restore mm balance 2/2 scoliotic curve for use of UE.    Time 12   Period Weeks   Status New   PT LONG TERM GOAL #3   Title Pt will report equal sensation along all UE dermatomes in order to show improved function   Time 12   Period Weeks   Status New   PT LONG TERM GOAL #4   Title Pt will demo decreased forward /slumped posture in order to better manage weakness in L arm.    Time 12   Period Weeks   Status New   PT LONG TERM GOAL #5   Title Pt will demo increased grip strength in L hand of 40 lb across 3 trials each in order to regain ADL function (hold cups, open jars).    Time 12   Period Weeks   Status New               Plan - 02/27/15 1041    Clinical Impression Statement Pt is a 29 yo female who c/o bilateral UE  numbness and tingling with L > R UE (all dermatomes) with weakness in L  UE. Her S & Sx from PT exam shows decreased L UE strength and sensation to  light touch, decreased cervical ROM,  increased mm tensions and tenderness  at suboccipital mm  2/2 R cervicothoracic L lumbar scoliotic curvature in  her spine. Suspect her Sx are associated with her scoliotic spine but will refer out if Sx do not improve after several visits. These deficits limit her ability to open jar, cut food and  other use of her hand in ADL tasks. Post-Tx today, pt showed increased L   UE strength  and cervical sidebend R but no changes yet in L  grip strength.      Pt will benefit from skilled therapeutic intervention in order to improve on the following deficits Decreased cognition;Decreased coordination;Decreased endurance;Decreased scar mobility;Increased fascial restricitons;Pain;Decreased safety awareness;Decreased range of motion;Decreased mobility;Impaired flexibility;Difficulty walking;Postural dysfunction;Increased muscle spasms;Improper body mechanics;Abnormal gait   Rehab Potential Good   PT Frequency 1x / week   PT Duration 12 weeks   PT Treatment/Interventions ADLs/Self  Care Home Management;Cryotherapy;Moist Heat;Gait training;Stair training;Functional mobility training;Therapeutic activities;Therapeutic exercise;Balance training;Neuromuscular re-education;Patient/family education;Orthotic Fit/Training;Scar mobilization;Passive range of motion;Dry needling;Energy conservation;Taping;Manual techniques;Electrical Stimulation;Aquatic Therapy;Biofeedback   Consulted and Agree with Plan of Care Patient   Family Member Consulted aid  G-Codes - 02/27/15 1053    Functional Assessment Tool Used Quick DASH   Functional Limitation Carrying, moving and handling objects   Carrying, Moving and Handling Objects Current Status [phone]) At least 1 percent but less than 20 percent impaired, limited or restricted   Carrying, Moving and Handling Objects Goal Status [id number]) At least 1 percent but less than 20 percent impaired, limited or restricted       Problem List Patient Active Problem List   Diagnosis Date Noted  . Major depressive disorder, recurrent episode, moderate ([place]) 10/29/2014  . PTSD (post-traumatic stress disorder) 10/29/2014  . Grief 10/29/2014  . CFIDS (chronic fatigue and immune dysfunction syndrome) 09/28/2014  . Aggrieved 09/28/2014  . Depression, major, recurrent ([place]) 09/28/2014  . Intellectual disability 09/28/2014  . Neurosis, posttraumatic 09/28/2014  . History of colitis 09/22/2014  . Hepatic steatosis 09/20/2014  . Endometriosis 08/30/2013    Jerl Mina ,PT, DPT, E-RYT  02/27/2015, 10:54 AM  [place] MAIN [facility] SERVICES [address] [place], [place], [address] Phone: [phone]   Fax:  [phone]  Name: SHENE JERDEE MRN: [id number] Date of Birth: 14-Jul-1985

## 2015-03-06 ENCOUNTER — Ambulatory Visit: Payer: Medicare Other | Admitting: Physical Therapy

## 2015-03-06 DIAGNOSIS — M609 Myositis, unspecified: Secondary | ICD-10-CM | POA: Diagnosis not present

## 2015-03-06 DIAGNOSIS — Z7409 Other reduced mobility: Secondary | ICD-10-CM | POA: Diagnosis not present

## 2015-03-06 DIAGNOSIS — IMO0001 Reserved for inherently not codable concepts without codable children: Secondary | ICD-10-CM

## 2015-03-06 DIAGNOSIS — R29898 Other symptoms and signs involving the musculoskeletal system: Secondary | ICD-10-CM | POA: Diagnosis not present

## 2015-03-06 DIAGNOSIS — M791 Myalgia: Secondary | ICD-10-CM | POA: Diagnosis not present

## 2015-03-06 NOTE — Therapy (Addendum)
[place] MAIN [facility] SERVICES [address] [place], [place], [address] Phone: [phone]   Fax:  [phone]  Physical Therapy Treatment  Patient Details  Name: LENNYX WINSTANLEY MRN: [id number] Date of Birth: 02-24-1986 Referring Provider: Yvette Rack, MD  Encounter Date: 03/06/2015      PT End of Session - 03/06/15 2348    Visit Number 2   Number of Visits 12   Date for PT Re-Evaluation 03/11/15   Authorization Type g-code aat 10th visit   PT Start Time 1100   PT Stop Time 1200   PT Time Calculation (min) 60 min      Past Medical History  Diagnosis Date  . Migraine   . Mental retardation   . Posttraumatic stress disorder   . Depression   . Hepatic steatosis   . Splenomegaly   . Elevated liver enzymes   . Uterine fibroid   . Endometriosis   . Chronic back pain   . Hypothyroid   . Chronic fatigue syndrome   . Scoliosis   . PUD (peptic ulcer disease)     Past Surgical History  Procedure Laterality Date  . Back surgery    . Laparoscopic endometriosis fulguration  2015    [name]  . Colonoscopy  04/27/2006    Wohl-colitis, internal hemorrhoids  . Esophagogastroduodenoscopy  02/2013    multiple peptic & duodenal ulcers, negative h pylori  . Colonoscopy  02/2013    focal right colitis, left colon biopsy negative, focal active proctitis without chronicity    There were no vitals filed for this visit.  Visit Diagnosis:  Mobility impaired  Weakness of left upper extremity  Myalgia and myositis      Subjective Assessment - 03/06/15 1115    Subjective Pt's aid was not present at appt today.  Pt appeared with make up and earrings today.  Pt reported she noticed she had more strength in her arms when play wrestlingw tih her fiance. Pt stated she was pleased to regain her strength and vopiced that if she does more activities with her hands, she knows she will get more strength back.  Pt reported she had L abdominal pain  yesterday which was mild at first in the morning. Pt was able to organize her fiance's birthday party.  At the end of day, pt reported her pain escalated to  the point that put her on the bathroom floor.  Pt's fiance reminded her to use her heating pad which helped to resolve her pain and to go to sleep.  Pt was not on her menstrual cycle. Pt stated she dislikes being not on birth control pill  for cyst related  issue.Pt understands why her OB-YN wants her to be off of birth control pill but pt  is concerned that her ovarian cyst may come back.    Pertinent History Pt lives in a supported living in her apt with with 3 hrs of staffing per day.             [facility] PT Assessment - 03/06/15 1144    Strength   Overall Strength Comments UE L 4+/5. Grip strength 35, 35, 40 #    Palpation   Palpation comment R suboccipatal mm tensions absent. Noted subluxation of L GH joint with chest rows w/ yellow band.  Withheld this exercise. Modified with hooklying "w". No subluxation noted,. added to HEP  [place] [facility] PT Treatment/Exercise - 03/06/15 1152    Self-Care   Other Self-Care Comments  provided self-care tips for management of abdominal pain for which pt was recently d/c for.   see pt instructions   Neuro Re-ed    Neuro Re-ed Details  log rolling , set up with HEP, "W" band exercises in hooklying 10 reps x 3 sets    performed 10 reps of chest rows but withheld due to subluxa   Exercises   Other Exercises  ball squeezes with breathing coordination 10 reps in L    Shoulder Exercises: Supine   Theraband Level (Shoulder ABduction) --  "W" yellow band 30 reps, education on set up                PT Education - 03/06/15 2348    Education provided Yes   Education Details HEP   Person(s) Educated Patient   Methods Explanation;Demonstration;Tactile cues;Verbal cues;Handout   Comprehension Verbalized understanding;Returned demonstration             PT  Long Term Goals - 02/27/15 1033    PT LONG TERM GOAL #1   Title Pt will decrease her QUick Dsh score from 11.4 to < 10 in order to increase functional use of L UE for ADLs.   Time 12   Period Weeks   Status New   PT LONG TERM GOAL #2   Title Pt will demo decreased tensions and tenderness along suboccipital mm on R in order to restore mm balance 2/2 scoliotic curve for use of UE.    Time 12   Period Weeks   Status New   PT LONG TERM GOAL #3   Title Pt will report equal sensation along all UE dermatomes in order to show improved function   Time 12   Period Weeks   Status New   PT LONG TERM GOAL #4   Title Pt will demo decreased forward /slumped posture in order to better manage weakness in L arm.    Time 12   Period Weeks   Status New   PT LONG TERM GOAL #5   Title Pt will demo increased grip strength in L hand of 40 lb across 3 trials each in order to regain ADL function (hold cups, open jars).    Time 12   Period Weeks   Status New               Plan - 03/06/15 2348    Clinical Impression Statement Pt demo improved LUE strength and grip strength today with no tensions in R suboccipital mm. Progressed her HEP. Withheld chest rows due to subluxation of L glenohumeral joint. Modified to hooklying "w" with yellow band to maintain scapular stabilization. Pt will continue to benefit from skilled PT.    Pt will benefit from skilled therapeutic intervention in order to improve on the following deficits Decreased cognition;Decreased coordination;Decreased endurance;Decreased scar mobility;Increased fascial restricitons;Pain;Decreased safety awareness;Decreased range of motion;Decreased mobility;Impaired flexibility;Difficulty walking;Postural dysfunction;Increased muscle spasms;Improper body mechanics;Abnormal gait   Rehab Potential Good   PT Frequency 1x / week   PT Duration 12 weeks   Consulted and Agree with Plan of Care Patient        Problem List Patient Active Problem  List   Diagnosis Date Noted  . Major depressive disorder, recurrent episode, moderate ([place]) 10/29/2014  . PTSD (post-traumatic stress disorder) 10/29/2014  . Grief 10/29/2014  . CFIDS (chronic fatigue and immune dysfunction syndrome) 09/28/2014  . Aggrieved 09/28/2014  .  Depression, major, recurrent ([place]) 09/28/2014  . Intellectual disability 09/28/2014  . Neurosis, posttraumatic 09/28/2014  . History of colitis 09/22/2014  . Hepatic steatosis 09/20/2014  . Endometriosis 08/30/2013    Jerl Mina ,PT, DPT, E-RYT  03/07/2015, 12:06 AM  [place] MAIN [facility] SERVICES [address] [place], [place], [address] Phone: [phone]   Fax:  [phone]  Name: CHONG CAMILLERI MRN: [id number] Date of Birth: [birth year]

## 2015-03-06 NOTE — Patient Instructions (Addendum)
Document on calendar when your  abdominal pain.   On days with increased activities in your schedule,  pace your activities when you start to feel the pain come on at a mild level.  After completing activities for 1 hr, make sure to lay down with heating pad for 20 min for a rest break. Afterwards, go to complete the remaining tasks. Avoid pushing through the pain and over-doing yourself with too many activities before self-care time.    ____________________________  Shoulder/ Hand Strengthening   Exercise 1  Laying on back with knees bend, yellow band under heels. Double wrap band around hands. Tuck shoulder blades under, avoid rounded shoulders.   Inhale do nothing, exhale pull band with elbow pressed into bed next to ribs  Like a "butterfly"   10 reps x 3 sets 2 x day     Exercise 2   Hold ball in L hand, in hale do nothing, exhale while squeezing

## 2015-03-08 ENCOUNTER — Encounter: Payer: Self-pay | Admitting: Physical Therapy

## 2015-03-11 ENCOUNTER — Ambulatory Visit: Payer: Medicare Other | Admitting: Physical Therapy

## 2015-03-15 ENCOUNTER — Encounter: Payer: Self-pay | Admitting: Physical Therapy

## 2015-03-18 ENCOUNTER — Encounter: Payer: Self-pay | Admitting: Physical Therapy

## 2015-03-18 ENCOUNTER — Ambulatory Visit: Payer: Medicare Other | Attending: Obstetrics and Gynecology | Admitting: Physical Therapy

## 2015-03-18 VITALS — BP 100/60

## 2015-03-18 DIAGNOSIS — M609 Myositis, unspecified: Secondary | ICD-10-CM | POA: Insufficient documentation

## 2015-03-18 DIAGNOSIS — R279 Unspecified lack of coordination: Secondary | ICD-10-CM

## 2015-03-18 DIAGNOSIS — IMO0001 Reserved for inherently not codable concepts without codable children: Secondary | ICD-10-CM

## 2015-03-18 DIAGNOSIS — M791 Myalgia: Secondary | ICD-10-CM | POA: Diagnosis not present

## 2015-03-18 DIAGNOSIS — R29898 Other symptoms and signs involving the musculoskeletal system: Secondary | ICD-10-CM | POA: Insufficient documentation

## 2015-03-18 DIAGNOSIS — R531 Weakness: Secondary | ICD-10-CM | POA: Diagnosis not present

## 2015-03-18 DIAGNOSIS — Z7409 Other reduced mobility: Secondary | ICD-10-CM | POA: Insufficient documentation

## 2015-03-19 DIAGNOSIS — R42 Dizziness and giddiness: Secondary | ICD-10-CM | POA: Diagnosis not present

## 2015-03-19 DIAGNOSIS — D259 Leiomyoma of uterus, unspecified: Secondary | ICD-10-CM | POA: Diagnosis not present

## 2015-03-19 DIAGNOSIS — N920 Excessive and frequent menstruation with regular cycle: Secondary | ICD-10-CM | POA: Diagnosis not present

## 2015-03-19 NOTE — Therapy (Addendum)
[place] MAIN [facility] SERVICES [address] [place], [place], [address] Phone: [phone]   Fax:  [phone]  Physical Therapy Treatment / Discharge Summary   Patient Details  Name: Teresa Crawford MRN: [id number] Date of Birth: [birth year] Referring Provider: Illene Bolus  Encounter Date: 03/18/2015      PT End of Session - 03/19/15 1534    Visit Number 3   Number of Visits 12   Date for PT Re-Evaluation 03/11/15   Authorization Type g-code at 10th visit   PT Start Time 1005   PT Stop Time 1125   PT Time Calculation (min) 80 min   Activity Tolerance Patient tolerated treatment well   Behavior During Therapy [facility] for tasks assessed/performed      Past Medical History  Diagnosis Date  . Migraine   . Mental retardation   . Posttraumatic stress disorder   . Depression   . Hepatic steatosis   . Splenomegaly   . Elevated liver enzymes   . Uterine fibroid   . Endometriosis   . Chronic back pain   . Hypothyroid   . Chronic fatigue syndrome   . Scoliosis   . PUD (peptic ulcer disease)     Past Surgical History  Procedure Laterality Date  . Back surgery    . Laparoscopic endometriosis fulguration  2015    [name]  . Colonoscopy  04/27/2006    Wohl-colitis, internal hemorrhoids  . Esophagogastroduodenoscopy  02/2013    multiple peptic & duodenal ulcers, negative h pylori  . Colonoscopy  02/2013    focal right colitis, left colon biopsy negative, focal active proctitis without chronicity    Filed Vitals:   03/18/15 1015  BP: 100/60    Visit Diagnosis:  Mobility impaired  Weakness of left upper extremity  Myalgia and myositis  Lack of coordination  Weakness      Subjective Assessment - 03/18/15 1015    Subjective Pt reported she is not having a good day. Pt reports she wishes to be put back on a  birth control pill  that will not cause weight gain in order to avoid menstrual-related pain. She has felt dizzy the past 3  days since she has started her menstrual cycle. Pt had a near fall and reported feeling drowsy, shaky, and weak.  This morning she felt dizzy and she felt like she might fall in the shower but did not fall. Pt also felt dizzy when walking his dog. Pt reported she ate cereal this morning but has not felt like eating much due to the pain.  PT called her aid into the room to f/u on questions.  Anne Ng (aid) reports pt has a Hx of not eating meals nor taking her medications and they try to encourage her to drink and eat more.        Pertinent History Pt lives in a supported living in her apt with with 3 hrs of staffing per day.             [facility] PT Assessment - 03/19/15 1423    Observation/Other Assessments   Quick DASH  2.27    AROM   Overall AROM  --  cervical AROM in supine WNL    Strength   Overall Strength Comments UE L 5/5. Grip strength 40, 40, 40 #                      [facility] Adult PT Treatment/Exercise - 03/19/15 1418  Self-care      Interviewed pt re: dizziness and near falls episodes in order to decide plan of action to get appropriate medical attention, requested for pt's aid to arrive into PT room for more questioning about pt's Sx, called RN of PCP to get an earlier appt and reported her Sx,  explained to pt  the importance of maintaining good nutrition especially during menstrual cycle and provided names of recipe books and strategized to eat a can of soup   or something easy to prep  even when she does not feel like eating due to pain. Monitored vitals, provided two cups of water, and moist heat pack over abdomen.  (no charge)      Therapeutic Activites [place] Therapy    ADL's reassessed goals, administered questionnaires.      Moist Heat Therapy   Number Minutes Moist Heat 10 Minutes   Moist Heat Location Other (comment)  abdomen                PT Education - 03/19/15 1532    Education provided Yes   Education Details HEP, educated about f/u  PCP re: her dizziness Sx and the importance of having food that is easy to prepare (can of soup) to eat and to not avoid eating/drinking water when she is experiencing pain related to menstrual cycle.    Person(s) Educated Patient   Methods Explanation;Demonstration;Tactile cues;Verbal cues   Comprehension Verbalized understanding;Returned demonstration             PT Long Term Goals - 03/18/15 1100    PT LONG TERM GOAL #1   Title Pt will decrease her QUick Dsh score from 11.4 to < 10 in order to increase functional use of L UE for ADLs.   Time 12   Period Weeks   Status New   PT LONG TERM GOAL #2   Title Pt will demo decreased tensions and tenderness along suboccipital mm on R in order to restore mm balance 2/2 scoliotic curve for use of UE.    Time 12   Period Weeks   Status Achieved   PT LONG TERM GOAL #3   Title Pt will report equal sensation along all UE dermatomes in order to show improved function   Time 12   Period Weeks   Status Not Met   PT LONG TERM GOAL #4   Title Pt will demo decreased forward /slumped posture in order to better manage weakness in L arm.    Time 12   Period Weeks   Status Achieved   PT LONG TERM GOAL #5   Title Pt will demo increased grip strength in L hand of 40 lb across 3 trials each in order to regain ADL function (hold cups, open jars).    Time 12   Period Weeks   Status Achieved               Plan - 03/18/15 1122    Clinical Impression Statement Pt reported feeling a "Great Deal Better" since she started PT for her L UE weakness and numbness. Pt achieved 100% of her goals and showed significantly improved UE strength and grip strength, upright posture, and  decreased neck mm tensions. Pt is ready for D/C today. However, it is important to note that pt continued to report decreased sensation along all L UE dermtomes.  Pt also required education for the half of her appointment time to address her Sx of dizziness, drowsiness, and near  fall  that occurred when she started her menstrual cycle three days ago.  PT notified the RN of her PCP and was able to help her get an earlier appt to be seen.   Pt and aid voiced understanding about f/u with MD re: Sx. Pt's BP was 100/60 and pt reported feeling better after drinking 2 cups of water and receiving moist heat pack over her abdomen for 15 min.      Pt will benefit from skilled therapeutic intervention in order to improve on the following deficits Decreased cognition;Decreased coordination;Decreased endurance;Decreased scar mobility;Increased fascial restricitons;Pain;Decreased safety awareness;Decreased range of motion;Decreased mobility;Impaired flexibility;Difficulty walking;Postural dysfunction;Increased muscle spasms;Improper body mechanics;Abnormal gait   Rehab Potential Good   PT Frequency 1x / week   PT Duration 12 weeks   Consulted and Agree with Plan of Care Patient        Problem List Patient Active Problem List   Diagnosis Date Noted  . Major depressive disorder, recurrent episode, moderate ([place]) 10/29/2014  . PTSD (post-traumatic stress disorder) 10/29/2014  . Grief 10/29/2014  . CFIDS (chronic fatigue and immune dysfunction syndrome) 09/28/2014  . Aggrieved 09/28/2014  . Depression, major, recurrent ([place]) 09/28/2014  . Intellectual disability 09/28/2014  . Neurosis, posttraumatic 09/28/2014  . History of colitis 09/22/2014  . Hepatic steatosis 09/20/2014  . Endometriosis 08/30/2013    Jerl Mina  ,PT, DPT, E-RYT  03/19/2015, 4:20 PM  [place] MAIN [facility] SERVICES [address] [place], [place], [address] Phone: [phone]   Fax:  [phone]  Name: DAKARI STABLER MRN: [id number] Date of Birth: November 25, 1985

## 2015-03-22 ENCOUNTER — Encounter: Payer: Self-pay | Admitting: Physical Therapy

## 2015-03-25 ENCOUNTER — Encounter: Payer: Medicare Other | Admitting: Physical Therapy

## 2015-03-27 DIAGNOSIS — D510 Vitamin B12 deficiency anemia due to intrinsic factor deficiency: Secondary | ICD-10-CM | POA: Diagnosis not present

## 2015-04-03 NOTE — Progress Notes (Signed)
Pharmacy notified.

## 2015-04-12 ENCOUNTER — Encounter: Payer: Self-pay | Admitting: Psychiatry

## 2015-04-12 ENCOUNTER — Ambulatory Visit (INDEPENDENT_AMBULATORY_CARE_PROVIDER_SITE_OTHER): Payer: Medicare Other | Admitting: Psychiatry

## 2015-04-12 VITALS — BP 122/84 | HR 62 | Temp 97.8°F | Ht 63.0 in | Wt 192.8 lb

## 2015-04-12 DIAGNOSIS — F331 Major depressive disorder, recurrent, moderate: Secondary | ICD-10-CM | POA: Diagnosis not present

## 2015-04-12 DIAGNOSIS — F4310 Post-traumatic stress disorder, unspecified: Secondary | ICD-10-CM | POA: Diagnosis not present

## 2015-04-12 MED ORDER — DULOXETINE HCL 20 MG PO CPEP: 20.0000 mg | ORAL_CAPSULE | Freq: Every day | ORAL | Status: DC

## 2015-04-12 MED ORDER — TRAZODONE HCL 100 MG PO TABS: 100.0000 mg | ORAL_TABLET | Freq: Every day | ORAL | Status: DC

## 2015-04-12 NOTE — Progress Notes (Signed)
[facility] MD/PA/NP OP Progress Note  04/12/2015 1:26 PM Teresa Crawford  MRN:  [id number]  Subjective:  Follow-up of her posttraumatic stress disorder and major depressive disorder, recurrent moderate. As in past appointments she presents with her case worker from her group home. At the last visit her primary care had stopped all of her medications and put her on Cymbalta. Patient states she continues to feel good on this medicine and denies weight gain which was the concern of the primary care. 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 She states she's sleeping well and does use trazodone every night. She discussed that a great on died at the age of 28 however she states she has been coping with it fairly well. She continues to find comfort in [place], her dog.  Chief Complaint: Doing okay Chief Complaint    Follow-up; Medication Refill     not eating Visit Diagnosis:     ICD-9-CM ICD-10-CM   1. Posttraumatic stress disorder 309.81 F43.10 traZODone (DESYREL) 100 MG tablet  2. Major depressive disorder, recurrent episode, moderate ([facility]) 296.32 F33.1 traZODone (DESYREL) 100 MG tablet    Past Medical History:  Past Medical History  Diagnosis Date  . Migraine   . Mental retardation   . Posttraumatic stress disorder   . Depression   . Hepatic steatosis   . Splenomegaly   . Elevated liver enzymes   . Uterine fibroid   . Endometriosis   . Chronic back pain   . Hypothyroid   . Chronic fatigue syndrome   . Scoliosis   . PUD (peptic ulcer disease)     Past Surgical History  Procedure Laterality Date  . Back surgery    . Laparoscopic endometriosis fulguration  2015    [name]  . Colonoscopy  04/27/2006    Wohl-colitis, internal hemorrhoids  . Esophagogastroduodenoscopy  02/2013    multiple peptic & duodenal ulcers, negative h  pylori  . Colonoscopy  02/2013    focal right colitis, left colon biopsy negative, focal active proctitis without chronicity   Family History:  Family History  Problem Relation Age of Onset  . Breast cancer Mother   . COPD Father   . Lung cancer Father   . Dementia Father   . Diabetes Sister   . Diabetes Brother   . Ovarian cancer Paternal Aunt   . Colon cancer Maternal Grandmother   . Liver disease Maternal Aunt     ?etiology   Social History:  Social History   Social History  . Marital Status: Single    Spouse Name: N/A  . Number of Children: 0  . Years of Education: N/A   Social History Main Topics  . Smoking status: Never Smoker   . Smokeless tobacco: Never Used  . Alcohol Use: No  . Drug Use: No  . Sexual Activity: No   Other Topics Concern  . None   Social History Narrative   She has brother, half brother & sister live nearby   Both parents deceased   Care managed by [name]   Additional History:   Assessment:   Musculoskeletal: Strength & Muscle Tone: within normal limits Gait & Station: normal Patient leans: N/A  Psychiatric Specialty Exam: Depression        Associated symptoms include does not have  insomnia and no suicidal ideas.   Review of Systems  Endo/Heme/Allergies:       Patient reports that have an ultrasound of her thyroid.  Psychiatric/Behavioral: Negative for depression, suicidal ideas, hallucinations, memory loss and substance abuse. The patient is not nervous/anxious and does not have insomnia.   All other systems reviewed and are negative.   Blood pressure 122/84, pulse 62, temperature 97.8 F (36.6 C), temperature source Tympanic, height 5\' 3"  (1.6 m), weight 192 lb 12.8 oz (87.454 kg), SpO2 98 %.Body mass index is 34.16 kg/(m^2).  General Appearance: Fairly Groomed  Eye Contact:  Fair  Speech:  Clear and Coherent and Normal Rate easily engaged, provides more spontaneous speech   Volume:  Normal  Mood:  Good  Affect:   Congruent smiling and able to laugh   Thought Process:  Concrete  Orientation:  Full (Time, Place, and Person)  Thought Content:  Negative  Suicidal Thoughts:  No  Homicidal Thoughts:  No  Memory:  Immediate;   Good Recent;   Good Remote;   Good  Judgement:  Fair  Insight:  Fair  Psychomotor Activity:  Negative  Concentration:  Good  Recall:  Good  Fund of Knowledge: Fair  Language: Fair  Akathisia:  Negative  Handed:  Right unknown  AIMS (if indicated):  Not done  Assets:  Social Support  ADL's:  Intact  Cognition: Impaired,  Moderate  Sleep:  Good    Is the patient at risk to self?  No. Has the patient been a risk to self in the past 6 months?  No. Has the patient been a risk to self within the distant past?  No. Is the patient a risk to others?  No. Has the patient been a risk to others in the past 6 months?  No. Has the patient been a risk to others within the distant past?  No.  Current Medications: Current Outpatient Prescriptions  Medication Sig Dispense Refill  . Butalbital-APAP-Caffeine 50-300-40 MG CAPS Take by mouth.    . Cholecalciferol (D 1000) 1000 UNITS capsule Take by mouth.    . diazepam (VALIUM) 5 MG tablet Insert one tablet into the vagina at bedtime    . docusate sodium (COLACE) 100 MG capsule Take by mouth.    . DULoxetine (CYMBALTA) 20 MG capsule Take 1 capsule (20 mg total) by mouth daily. 30 capsule 4  . EPINEPHrine (EPIPEN 2-PAK) 0.3 mg/0.3 mL IJ SOAJ injection Inject into the muscle.    . fexofenadine (ALLEGRA) 180 MG tablet Take 180 mg by mouth daily.    . fluticasone (FLONASE) 50 MCG/ACT nasal spray Place 1 spray into both nostrils daily.    Marland Kitchen ibuprofen (ADVIL,MOTRIN) 600 MG tablet Take 600 mg by mouth every 6 (six) hours as needed.    Marland Kitchen leuprolide (LUPRON DEPOT) 11.25 MG injection Inject into the muscle.    . naproxen (NAPROSYN) 500 MG tablet Take 1 tablet (500 mg total) by mouth 2 (two) times daily with a meal. 20 tablet 2  . Norethin-Eth  Estradiol-Fe [facility] FE,WYMZYA Orrin Brigham) 0.4-35 MG-MCG tablet Chew by mouth.    Marland Kitchen omeprazole (PRILOSEC) 20 MG capsule Take by mouth.    . ondansetron (ZOFRAN) 8 MG tablet Take 8 mg by mouth every 8 (eight) hours as needed for nausea or vomiting.    . promethazine (PHENERGAN) 12.5 MG tablet Take by mouth.    . SUMAtriptan (IMITREX) 50 MG tablet Take by mouth.    . topiramate (TOPAMAX) 50 MG tablet  Take 25 mg by mouth.     . traMADol (ULTRAM) 50 MG tablet Take 100 mg by mouth every 6 (six) hours as needed. For pain.    . traZODone (DESYREL) 100 MG tablet Take 1 tablet (100 mg total) by mouth at bedtime. 30 tablet 4  . Vitamin D, Ergocalciferol, (DRISDOL) 50000 UNITS CAPS capsule Take by mouth.     No current facility-administered medications for this visit.    Medical Decision Making:  Established Problem, Stable/Improving (1)  Treatment Plan Summary:Medication management   Post traumatic stress disorder- Continue  Cymbalta 20 mg daily   Major depressive disorder, recurrent, moderate- mood is bright. Cymbalta as above. Patient continues to use trazodone 100 mg at bedtime with good benefit.  Patient is also on Valium prescribed by another doctor for pelvic muscle spasms.  Patient will return in  3 months. Discussed my departure from the clinic and that she can transition to another provider within this clinic.She has  been encouraged call with any questions or concerns prior to her next appointment.  Faith Rogue 04/12/2015, 1:26 PM

## 2015-04-24 DIAGNOSIS — D510 Vitamin B12 deficiency anemia due to intrinsic factor deficiency: Secondary | ICD-10-CM | POA: Diagnosis not present

## 2015-05-05 ENCOUNTER — Encounter: Payer: Self-pay | Admitting: Emergency Medicine

## 2015-05-05 ENCOUNTER — Emergency Department
Admission: EM | Admit: 2015-05-05 | Discharge: 2015-05-05 | Disposition: A | Discharge status: Home / Self Care | Payer: Medicare Other | Attending: Emergency Medicine | Admitting: Emergency Medicine

## 2015-05-05 DIAGNOSIS — K921 Melena: Secondary | ICD-10-CM | POA: Diagnosis present

## 2015-05-05 DIAGNOSIS — Z7951 Long term (current) use of inhaled steroids: Secondary | ICD-10-CM | POA: Diagnosis not present

## 2015-05-05 DIAGNOSIS — F419 Anxiety disorder, unspecified: Secondary | ICD-10-CM | POA: Insufficient documentation

## 2015-05-05 DIAGNOSIS — Z793 Long term (current) use of hormonal contraceptives: Secondary | ICD-10-CM | POA: Insufficient documentation

## 2015-05-05 DIAGNOSIS — Z791 Long term (current) use of non-steroidal anti-inflammatories (NSAID): Secondary | ICD-10-CM | POA: Insufficient documentation

## 2015-05-05 DIAGNOSIS — K649 Unspecified hemorrhoids: Secondary | ICD-10-CM

## 2015-05-05 DIAGNOSIS — G8929 Other chronic pain: Secondary | ICD-10-CM | POA: Insufficient documentation

## 2015-05-05 DIAGNOSIS — Z79899 Other long term (current) drug therapy: Secondary | ICD-10-CM | POA: Diagnosis not present

## 2015-05-05 DIAGNOSIS — K644 Residual hemorrhoidal skin tags: Secondary | ICD-10-CM | POA: Diagnosis not present

## 2015-05-05 LAB — CBC WITH DIFFERENTIAL/PLATELET
BASOS PCT: 1 %
Basophils Absolute: 0 10*3/uL (ref 0–0.1)
EOS ABS: 0.1 10*3/uL (ref 0–0.7)
EOS PCT: 2 %
HCT: 42.6 % (ref 35.0–47.0)
Hemoglobin: 14.3 g/dL (ref 12.0–16.0)
Lymphocytes Relative: 28 %
Lymphs Abs: 1.7 10*3/uL (ref 1.0–3.6)
MCH: 28 pg (ref 26.0–34.0)
MCHC: 33.6 g/dL (ref 32.0–36.0)
MCV: 83.3 fL (ref 80.0–100.0)
Monocytes Absolute: 0.3 10*3/uL (ref 0.2–0.9)
Monocytes Relative: 6 %
NEUTROS PCT: 63 %
Neutro Abs: 3.9 10*3/uL (ref 1.4–6.5)
Platelets: 178 10*3/uL (ref 150–440)
RBC: 5.12 MIL/uL (ref 3.80–5.20)
RDW: 13.9 % (ref 11.5–14.5)
WBC: 6.2 10*3/uL (ref 3.6–11.0)

## 2015-05-05 NOTE — ED Notes (Signed)
Blood in stool yesterday, worse today and vomited

## 2015-05-05 NOTE — ED Notes (Signed)
NAD noted at time of D/C. Pt denies questions or concerns. Pt ambulatory to the lobby at this time.  

## 2015-05-05 NOTE — ED Provider Notes (Signed)
[facility] Emergency Department Provider Note  ____________________________________________    I have reviewed the triage vital signs and the nursing notes.   HISTORY  Chief Complaint Blood In Stools    HPI Teresa Crawford is a 30 y.o. female who has a caregiver with her who presents with complaints of blood in her stool. She reports after having a bowel movement that she noticed bright red blood on the toilet paper. She does have a history of chronic abdominal pain which is no different. There have been no fevers or chills. No history of GI bleeding. She is not on blood thinners.       Past Medical History  Diagnosis Date  . Migraine   . Mental retardation   . Posttraumatic stress disorder   . Depression   . Hepatic steatosis   . Splenomegaly   . Elevated liver enzymes   . Uterine fibroid   . Endometriosis   . Chronic back pain   . Hypothyroid   . Chronic fatigue syndrome   . Scoliosis   . PUD (peptic ulcer disease)     Patient Active Problem List   Diagnosis Date Noted  . Major depressive disorder, recurrent episode, moderate ([place]) 10/29/2014  . PTSD (post-traumatic stress disorder) 10/29/2014  . Grief 10/29/2014  . CFIDS (chronic fatigue and immune dysfunction syndrome) 09/28/2014  . Aggrieved 09/28/2014  . Depression, major, recurrent ([place]) 09/28/2014  . Intellectual disability 09/28/2014  . Neurosis, posttraumatic 09/28/2014  . History of colitis 09/22/2014  . Hepatic steatosis 09/20/2014  . Endometriosis 08/30/2013    Past Surgical History  Procedure Laterality Date  . Back surgery    . Laparoscopic endometriosis fulguration  2015    [name]  . Colonoscopy  04/27/2006    Wohl-colitis, internal hemorrhoids  . Esophagogastroduodenoscopy  02/2013    multiple peptic & duodenal ulcers, negative h pylori  . Colonoscopy  02/2013    focal right colitis, left colon biopsy negative, focal active proctitis without chronicity     Current Outpatient Rx  Name  Route  Sig  Dispense  Refill  . Butalbital-APAP-Caffeine 50-300-40 MG CAPS   Oral   Take by mouth.         . Cholecalciferol (D 1000) 1000 UNITS capsule   Oral   Take by mouth.         . diazepam (VALIUM) 5 MG tablet      Insert one tablet into the vagina at bedtime         . docusate sodium (COLACE) 100 MG capsule   Oral   Take by mouth.         . DULoxetine (CYMBALTA) 20 MG capsule   Oral   Take 1 capsule (20 mg total) by mouth daily.   30 capsule   4   . EPINEPHrine (EPIPEN 2-PAK) 0.3 mg/0.3 mL IJ SOAJ injection   Intramuscular   Inject into the muscle.         . fexofenadine (ALLEGRA) 180 MG tablet   Oral   Take 180 mg by mouth daily.         . fluticasone (FLONASE) 50 MCG/ACT nasal spray   Each Nare   Place 1 spray into both nostrils daily.         Marland Kitchen ibuprofen (ADVIL,MOTRIN) 600 MG tablet   Oral   Take 600 mg by mouth every 6 (six) hours as needed.         Marland Kitchen leuprolide (LUPRON DEPOT) 11.25 MG  injection   Intramuscular   Inject into the muscle.         . naproxen (NAPROSYN) 500 MG tablet   Oral   Take 1 tablet (500 mg total) by mouth 2 (two) times daily with a meal.   20 tablet   2   . Norethin-Eth Estradiol-Fe [facility] FE,WYMZYA Orrin Brigham) 0.4-35 MG-MCG tablet   Oral   Chew by mouth.         Marland Kitchen omeprazole (PRILOSEC) 20 MG capsule   Oral   Take by mouth.         . ondansetron (ZOFRAN) 8 MG tablet   Oral   Take 8 mg by mouth every 8 (eight) hours as needed for nausea or vomiting.         . promethazine (PHENERGAN) 12.5 MG tablet   Oral   Take by mouth.         . SUMAtriptan (IMITREX) 50 MG tablet   Oral   Take by mouth.         . topiramate (TOPAMAX) 50 MG tablet   Oral   Take 25 mg by mouth.          . traMADol (ULTRAM) 50 MG tablet   Oral   Take 100 mg by mouth every 6 (six) hours as needed. For pain.         . traZODone (DESYREL) 100 MG tablet   Oral   Take  1 tablet (100 mg total) by mouth at bedtime.   30 tablet   4   . Vitamin D, Ergocalciferol, (DRISDOL) 50000 UNITS CAPS capsule   Oral   Take by mouth.           Allergies Peanut butter flavor; Codeine; Paroxetine; Paxil; Venlafaxine; and Zoloft  Family History  Problem Relation Age of Onset  . Breast cancer Mother   . COPD Father   . Lung cancer Father   . Dementia Father   . Diabetes Sister   . Diabetes Brother   . Ovarian cancer Paternal Aunt   . Colon cancer Maternal Grandmother   . Liver disease Maternal Aunt     ?etiology    Social History Social History  Substance Use Topics  . Smoking status: Never Smoker   . Smokeless tobacco: Never Used  . Alcohol Use: No    Review of Systems  Constitutional: Negative for fever. Eyes: Negative for visual changes. ENT: Negative for sore throat Cardiovascular: Negative for chest pain. Respiratory: Negative for shortness of breath. Gastrointestinal: Chronic abdominal pain, no diarrhea, soft stools, brown Genitourinary: Negative for dysuria. Musculoskeletal: Negative for back pain. Skin: Negative for rash. Neurological: Negative for headaches Psychiatric: Anxious about losing blood    ____________________________________________   PHYSICAL EXAM:  VITAL SIGNS: ED Triage Vitals  Enc Vitals Group     BP 05/05/15 1203 117/81 mmHg     Pulse Rate 05/05/15 1203 78     Resp 05/05/15 1203 18     Temp 05/05/15 1204 97.9 F (36.6 C)     Temp src --      SpO2 05/05/15 1203 98 %     Weight 05/05/15 1203 190 lb (86.183 kg)     Height 05/05/15 1203 5\' 6"  (1.676 m)     Head Cir --      Peak Flow --      Pain Score --      Pain Loc --      Pain Edu? --  Excl. in [place]? --      Constitutional: Alert and oriented. Well appearing and in no distress. Eyes: Conjunctivae are normal.  ENT   Head: Normocephalic and atraumatic.   Mouth/Throat: Mucous membranes are moist. Cardiovascular: Normal rate, regular  rhythm. Normal and symmetric distal pulses are present in all extremities.  Respiratory: Normal respiratory effort without tachypnea nor retractions.  Gastrointestinal: Soft and non-tender in all quadrants. No distention. There is no CVA tenderness. Guaiac negative stool, small external hemorrhoid noted, nonbleeding. Genitourinary: deferred Musculoskeletal: Nontender with normal range of motion in all extremities. No lower extremity tenderness nor edema. Neurologic:  Normal speech and language. No gross focal neurologic deficits are appreciated. Skin:  Skin is warm, dry and intact. No rash noted. Psychiatric: Mood and affect are normal. Patient exhibits appropriate insight and judgment.  ____________________________________________    LABS (pertinent positives/negatives)  Labs Reviewed  CBC WITH DIFFERENTIAL/PLATELET  BASIC METABOLIC PANEL    ____________________________________________   EKG  None  ____________________________________________    RADIOLOGY I have personally reviewed any xrays that were ordered on this patient: None  ____________________________________________   PROCEDURES  Procedure(s) performed: none  Critical Care performed: none  ____________________________________________   INITIAL IMPRESSION / ASSESSMENT AND PLAN / ED COURSE  Pertinent labs & imaging results that were available during my care of the patient were reviewed by me and considered in my medical decision making (see chart for details).  History of present illness and exam is most consistent with hemorrhoidal bleeding. Patient's hemoglobin is 14.3 and quite stable. Her vitals are unremarkable. Exam is consistent with small external hemorrhoid which I suspect bled earlier. She is already on Colace. Recommended continued monitoring by caregiver.  ____________________________________________   FINAL CLINICAL IMPRESSION(S) / ED DIAGNOSES  Final diagnoses:  Hemorrhoids, unspecified  hemorrhoid type     Lavonia Drafts, MD 05/05/15 1756

## 2015-05-05 NOTE — ED Notes (Signed)
Pt. BMP hemolyzed in the lab. Attempted at blood draw 4 times. If needed will be redrawn with IV started.

## 2015-05-05 NOTE — Discharge Instructions (Signed)
Hemorrhoids  Hemorrhoids are puffy (swollen) veins around the rectum or anus. Hemorrhoids can cause pain, itching, bleeding, or irritation.  HOME CARE  · Eat foods with fiber, such as whole grains, beans, nuts, fruits, and vegetables. Ask your doctor about taking products with added fiber in them (fiber supplements).   · Drink enough fluid to keep your pee (urine) clear or pale yellow.  · Exercise often.  · Go to the bathroom when you have the urge to poop. Do not wait.  · Avoid straining to poop (bowel movement).  · Keep the butt area dry and clean. Use wet toilet paper or moist paper towels.  · Medicated creams and medicine inserted into the anus (anal suppository) may be used or applied as told.  · Only take medicine as told by your doctor.  · Take a warm water bath (sitz bath) for 15-20 minutes to ease pain. Do this 3-4 times a day.  · Place ice packs on the area if it is tender or puffy. Use the ice packs between the warm water baths.    Put ice in a plastic bag.    Place a towel between your skin and the bag.    Leave the ice on for 15-20 minutes, 03-04 times a day.  · Do not use a donut-shaped pillow or sit on the toilet for a long time.  GET HELP RIGHT AWAY IF:   · You have more pain that is not controlled by treatment or medicine.  · You have bleeding that will not stop.  · You have trouble or are unable to poop (bowel movement).  · You have pain or puffiness outside the area of the hemorrhoids.  MAKE SURE YOU:   · Understand these instructions.  · Will watch your condition.  · Will get help right away if you are not doing well or get worse.     This information is not intended to replace advice given to you by your health care provider. Make sure you discuss any questions you have with your health care provider.     Document Released: 12/31/2007 Document Revised: 03/09/2012 Document Reviewed: 02/02/2012  Elsevier Interactive Patient Education ©2016 [employer]

## 2015-05-23 DIAGNOSIS — F4310 Post-traumatic stress disorder, unspecified: Secondary | ICD-10-CM | POA: Diagnosis not present

## 2015-05-23 DIAGNOSIS — R635 Abnormal weight gain: Secondary | ICD-10-CM | POA: Diagnosis not present

## 2015-05-23 DIAGNOSIS — F331 Major depressive disorder, recurrent, moderate: Secondary | ICD-10-CM | POA: Diagnosis not present

## 2015-05-23 DIAGNOSIS — E039 Hypothyroidism, unspecified: Secondary | ICD-10-CM | POA: Diagnosis not present

## 2015-05-23 DIAGNOSIS — R682 Dry mouth, unspecified: Secondary | ICD-10-CM | POA: Diagnosis not present

## 2015-05-23 DIAGNOSIS — E539 Vitamin B deficiency, unspecified: Secondary | ICD-10-CM | POA: Diagnosis not present

## 2015-06-14 ENCOUNTER — Emergency Department
Admission: EM | Admit: 2015-06-14 | Discharge: 2015-06-14 | Disposition: A | Discharge status: Home / Self Care | Payer: Medicare Other | Attending: Emergency Medicine | Admitting: Emergency Medicine

## 2015-06-14 ENCOUNTER — Encounter: Payer: Self-pay | Admitting: Medical Oncology

## 2015-06-14 ENCOUNTER — Emergency Department: Payer: Medicare Other

## 2015-06-14 DIAGNOSIS — Z79899 Other long term (current) drug therapy: Secondary | ICD-10-CM | POA: Insufficient documentation

## 2015-06-14 DIAGNOSIS — R0602 Shortness of breath: Secondary | ICD-10-CM | POA: Diagnosis not present

## 2015-06-14 DIAGNOSIS — R079 Chest pain, unspecified: Secondary | ICD-10-CM | POA: Insufficient documentation

## 2015-06-14 DIAGNOSIS — R42 Dizziness and giddiness: Secondary | ICD-10-CM | POA: Diagnosis not present

## 2015-06-14 DIAGNOSIS — Z791 Long term (current) use of non-steroidal anti-inflammatories (NSAID): Secondary | ICD-10-CM | POA: Diagnosis not present

## 2015-06-14 LAB — BASIC METABOLIC PANEL
Anion gap: 7 (ref 5–15)
BUN: 8 mg/dL (ref 6–20)
CO2: 24 mmol/L (ref 22–32)
CREATININE: 0.6 mg/dL (ref 0.44–1.00)
Calcium: 9.1 mg/dL (ref 8.9–10.3)
Chloride: 109 mmol/L (ref 101–111)
GFR calc Af Amer: >60 mL/min (ref >60)
GFR calc non Af Amer: >60 mL/min (ref >60)
GLUCOSE: 106 mg/dL — AB (ref 65–99)
POTASSIUM: 3.7 mmol/L (ref 3.5–5.1)
SODIUM: 140 mmol/L (ref 135–145)

## 2015-06-14 LAB — CBC
HEMATOCRIT: 38.2 % (ref 35.0–47.0)
Hemoglobin: 13 g/dL (ref 12.0–16.0)
MCH: 28.2 pg (ref 26.0–34.0)
MCHC: 34.1 g/dL (ref 32.0–36.0)
MCV: 82.7 fL (ref 80.0–100.0)
Platelets: 182 10*3/uL (ref 150–440)
RBC: 4.61 MIL/uL (ref 3.80–5.20)
RDW: 13.9 % (ref 11.5–14.5)
WBC: 5.8 10*3/uL (ref 3.6–11.0)

## 2015-06-14 LAB — TROPONIN I: Troponin I: <0.03 ng/mL (ref <0.031)

## 2015-06-14 NOTE — ED Notes (Signed)
Pt reports she began having central chest tightness Tuesday, pt also reports dizziness.

## 2015-06-14 NOTE — ED Provider Notes (Signed)
[facility] Emergency Department Provider Note   ____________________________________________  Time seen: Approximately 4:50 PM I have reviewed the triage vital signs and the triage nursing note.  HISTORY  Chief Complaint Chest Pain and Dizziness   Historian Patient, caregiver who helps this patient was independent living  HPI Teresa Crawford is a 30 y.o. female with a history of mild mental retardation, depression, post amount of stress disorder, is here for complaint of middle of the chest pain that started on Tuesday and has been essentially ongoing since then. Nothing makes it worse or better. Not associated with GI symptoms or burping or belching. No fevers. No coughing. She is also reporting some mild dizziness that seems to be worse with change of position. No seizure. No focal weakness or numbness. No altered mental status. No pleuritic chest pain. Pain is relatively constant.  No new medications.    Past Medical History  Diagnosis Date  . Migraine   . Mental retardation   . Posttraumatic stress disorder   . Depression   . Hepatic steatosis   . Splenomegaly   . Elevated liver enzymes   . Uterine fibroid   . Endometriosis   . Chronic back pain   . Hypothyroid   . Chronic fatigue syndrome   . Scoliosis   . PUD (peptic ulcer disease)     Patient Active Problem List   Diagnosis Date Noted  . Major depressive disorder, recurrent episode, moderate ([place]) 10/29/2014  . PTSD (post-traumatic stress disorder) 10/29/2014  . Grief 10/29/2014  . CFIDS (chronic fatigue and immune dysfunction syndrome) 09/28/2014  . Aggrieved 09/28/2014  . Depression, major, recurrent ([place]) 09/28/2014  . Intellectual disability 09/28/2014  . Neurosis, posttraumatic 09/28/2014  . History of colitis 09/22/2014  . Hepatic steatosis 09/20/2014  . Endometriosis 08/30/2013    Past Surgical History  Procedure Laterality Date  . Back surgery    . Laparoscopic  endometriosis fulguration  2015    [name]  . Colonoscopy  04/27/2006    Wohl-colitis, internal hemorrhoids  . Esophagogastroduodenoscopy  02/2013    multiple peptic & duodenal ulcers, negative h pylori  . Colonoscopy  02/2013    focal right colitis, left colon biopsy negative, focal active proctitis without chronicity    Current Outpatient Rx  Name  Route  Sig  Dispense  Refill  . Butalbital-APAP-Caffeine 50-300-40 MG CAPS   Oral   Take by mouth.         . Cholecalciferol (D 1000) 1000 UNITS capsule   Oral   Take by mouth.         . diazepam (VALIUM) 5 MG tablet      Insert one tablet into the vagina at bedtime         . docusate sodium (COLACE) 100 MG capsule   Oral   Take by mouth.         . DULoxetine (CYMBALTA) 20 MG capsule   Oral   Take 1 capsule (20 mg total) by mouth daily.   30 capsule   4   . EPINEPHrine (EPIPEN 2-PAK) 0.3 mg/0.3 mL IJ SOAJ injection   Intramuscular   Inject into the muscle.         . fexofenadine (ALLEGRA) 180 MG tablet   Oral   Take 180 mg by mouth daily.         . fluticasone (FLONASE) 50 MCG/ACT nasal spray   Each Nare   Place 1 spray into both nostrils daily.         Marland Kitchen  ibuprofen (ADVIL,MOTRIN) 600 MG tablet   Oral   Take 600 mg by mouth every 6 (six) hours as needed.         Marland Kitchen leuprolide (LUPRON DEPOT) 11.25 MG injection   Intramuscular   Inject into the muscle.         . naproxen (NAPROSYN) 500 MG tablet   Oral   Take 1 tablet (500 mg total) by mouth 2 (two) times daily with a meal.   20 tablet   2   . Norethin-Eth Estradiol-Fe [facility] FE,WYMZYA Orrin Brigham) 0.4-35 MG-MCG tablet   Oral   Chew by mouth.         Marland Kitchen omeprazole (PRILOSEC) 20 MG capsule   Oral   Take by mouth.         . ondansetron (ZOFRAN) 8 MG tablet   Oral   Take 8 mg by mouth every 8 (eight) hours as needed for nausea or vomiting.         . promethazine (PHENERGAN) 12.5 MG tablet   Oral   Take by mouth.          . SUMAtriptan (IMITREX) 50 MG tablet   Oral   Take by mouth.         . topiramate (TOPAMAX) 50 MG tablet   Oral   Take 25 mg by mouth.          . traMADol (ULTRAM) 50 MG tablet   Oral   Take 100 mg by mouth every 6 (six) hours as needed. For pain.         . traZODone (DESYREL) 100 MG tablet   Oral   Take 1 tablet (100 mg total) by mouth at bedtime.   30 tablet   4   . Vitamin D, Ergocalciferol, (DRISDOL) 50000 UNITS CAPS capsule   Oral   Take by mouth.           Allergies Peanut butter flavor; Codeine; Paroxetine; Paxil; Venlafaxine; and Zoloft  Family History  Problem Relation Age of Onset  . Breast cancer Mother   . COPD Father   . Lung cancer Father   . Dementia Father   . Diabetes Sister   . Diabetes Brother   . Ovarian cancer Paternal Aunt   . Colon cancer Maternal Grandmother   . Liver disease Maternal Aunt     ?etiology    Social History Social History  Substance Use Topics  . Smoking status: Never Smoker   . Smokeless tobacco: Never Used  . Alcohol Use: No    Review of Systems  Constitutional: Negative for fever. Eyes: Negative for visual changes. ENT: Negative for sore throat. Cardiovascular: Negative for palpitations Respiratory: Negative for shortness of breath. Gastrointestinal: Negative for abdominal pain, vomiting and diarrhea. Genitourinary: Negative for dysuria. Musculoskeletal: Negative for back pain. Skin: Negative for rash. Neurological: Negative for headache. 10 point Review of Systems otherwise negative ____________________________________________   PHYSICAL EXAM:  VITAL SIGNS: ED Triage Vitals  Enc Vitals Group     BP 06/14/15 1310 104/58 mmHg     Pulse Rate 06/14/15 1310 77     Resp 06/14/15 1310 16     Temp 06/14/15 1310 97.5 F (36.4 C)     Temp Source 06/14/15 1310 Oral     SpO2 06/14/15 1310 100 %     Weight 06/14/15 1310 190 lb (86.183 kg)     Height 06/14/15 1310 5\' 6"  (1.676 m)     Head Cir --  Peak Flow --      Pain Score 06/14/15 1311 7     Pain Loc --      Pain Edu? --      Excl. in [place]? --      Constitutional: Alert and oriented. Well appearing and in no distress. HEENT   Head: Normocephalic and atraumatic.      Eyes: Conjunctivae are normal. PERRL. Normal extraocular movements.      Ears:         Nose: No congestion/rhinnorhea.   Mouth/Throat: Mucous membranes are moist.   Neck: No stridor. Cardiovascular/Chest: Normal rate, regular rhythm.  No murmurs, rubs, or gallops. Nontender chest to palpation and compression. Respiratory: Normal respiratory effort without tachypnea nor retractions. Breath sounds are clear and equal bilaterally. No wheezes/rales/rhonchi. Gastrointestinal: Soft. No distention, no guarding, no rebound. Nontender.    Genitourinary/rectal:Deferred Musculoskeletal: Nontender with normal range of motion in all extremities. No joint effusions.  No lower extremity tenderness.  No edema. Neurologic:  Normal speech and language. No gross or focal neurologic deficits are appreciated. Skin:  Skin is warm, dry and intact. No rash noted. Psychiatric: Mood and affect are normal. Speech and behavior are normal. Patient exhibits appropriate insight and judgment.  ____________________________________________   EKG I, Lisa Roca, MD, the attending physician have personally viewed and interpreted all ECGs.  71 bpm. Normal sinus rhythm. Narrow QRS. Normal axis. Normal ST and T-wave ____________________________________________  LABS (pertinent positives/negatives)  Basic metabolic panel within normal limits CBC within normal limits Troponin less than 0.03  ____________________________________________  RADIOLOGY All Xrays were viewed by me. Imaging interpreted by Radiologist.  Chest: No acute disease __________________________________________  PROCEDURES  Procedure(s) performed: Orthostatics performed by me. Lying 112/66 with pulse  73 Sitting blood pressure 120/74 with pulse 70 Standing blood pressure 149/79 with pulse 82  Critical Care performed: None  ____________________________________________   ED COURSE / ASSESSMENT AND PLAN  Pertinent labs & imaging results that were available during my care of the patient were reviewed by me and considered in my medical decision making (see chart for details).   This patient has nonspecific chest pain going on for several days now. I'm unsure what may be causing her pain, however her exam and evaluation are reassuring today in the emergency department. I'm not suspicious of an acute vascular, cardiac, or pulmonary cause of chest discomfort.  In terms of the dizziness, she has an intact neurologic exam. I'm not concerned about an intracranial emergency at this point time.  I'm wondering if maybe some of her psychiatric medications may be causing the dizziness, although she denies any change or new prescriptions recently.  In either case, I think it is safe to discharge from the emergency department today.       CONSULTATIONS:   None   Patient / Family / Caregiver informed of clinical course, medical decision-making process, and agree with plan.   I discussed return precautions, follow-up instructions, and discharged instructions with patient and/or family.   ___________________________________________   FINAL CLINICAL IMPRESSION(S) / ED DIAGNOSES   Final diagnoses:  Chest pain, unspecified              Note: This dictation was prepared with Dragon dictation. Any transcriptional errors that result from this process are unintentional   Lisa Roca, MD 06/14/15 1724

## 2015-06-14 NOTE — Discharge Instructions (Signed)
You were evaluated for chest discomfort for several days, and some dizziness with standing, and although no certain cause was found, your examine evaluation are reassuring in the emergency department stay.  Return to the emergency department for any new or worsening condition including trouble breathing, fever, passing out, or any other symptoms concerning to you such as worsening chest pain.   Nonspecific Chest Pain  Chest pain can be caused by many different conditions. There is always a chance that your pain could be related to something serious, such as a heart attack or a blood clot in your lungs. Chest pain can also be caused by conditions that are not life-threatening. If you have chest pain, it is very important to follow up with your health care provider. CAUSES  Chest pain can be caused by:  Heartburn.  Pneumonia or bronchitis.  Anxiety or stress.  Inflammation around your heart (pericarditis) or lung (pleuritis or pleurisy).  A blood clot in your lung.  A collapsed lung (pneumothorax). It can develop suddenly on its own (spontaneous pneumothorax) or from trauma to the chest.  Shingles infection (varicella-zoster virus).  Heart attack.  Damage to the bones, muscles, and cartilage that make up your chest wall. This can include:  Bruised bones due to injury.  Strained muscles or cartilage due to frequent or repeated coughing or overwork.  Fracture to one or more ribs.  Sore cartilage due to inflammation (costochondritis). RISK FACTORS  Risk factors for chest pain may include:  Activities that increase your risk for trauma or injury to your chest.  Respiratory infections or conditions that cause frequent coughing.  Medical conditions or overeating that can cause heartburn.  Heart disease or family history of heart disease.  Conditions or health behaviors that increase your risk of developing a blood clot.  Having had chicken pox (varicella zoster). SIGNS AND  SYMPTOMS Chest pain can feel like:  Burning or tingling on the surface of your chest or deep in your chest.  Crushing, pressure, aching, or squeezing pain.  Dull or sharp pain that is worse when you move, cough, or take a deep breath.  Pain that is also felt in your back, neck, shoulder, or arm, or pain that spreads to any of these areas. Your chest pain may come and go, or it may stay constant. DIAGNOSIS Lab tests or other studies may be needed to find the cause of your pain. Your health care provider may have you take a test called an ambulatory ECG (electrocardiogram). An ECG records your heartbeat patterns at the time the test is performed. You may also have other tests, such as:  Transthoracic echocardiogram (TTE). During echocardiography, sound waves are used to create a picture of all of the heart structures and to look at how blood flows through your heart.  Transesophageal echocardiogram (TEE).This is a more advanced imaging test that obtains images from inside your body. It allows your health care provider to see your heart in finer detail.  Cardiac monitoring. This allows your health care provider to monitor your heart rate and rhythm in real time.  Holter monitor. This is a portable device that records your heartbeat and can help to diagnose abnormal heartbeats. It allows your health care provider to track your heart activity for several days, if needed.  Stress tests. These can be done through exercise or by taking medicine that makes your heart beat more quickly.  Blood tests.  Imaging tests. TREATMENT  Your treatment depends on what is causing your  chest pain. Treatment may include:  Medicines. These may include:  Acid blockers for heartburn.  Anti-inflammatory medicine.  Pain medicine for inflammatory conditions.  Antibiotic medicine, if an infection is present.  Medicines to dissolve blood clots.  Medicines to treat coronary artery disease.  Supportive  care for conditions that do not require medicines. This may include:  Resting.  Applying heat or cold packs to injured areas.  Limiting activities until pain decreases. HOME CARE INSTRUCTIONS  If you were prescribed an antibiotic medicine, finish it all even if you start to feel better.  Avoid any activities that bring on chest pain.  Do not use any tobacco products, including cigarettes, chewing tobacco, or electronic cigarettes. If you need help quitting, ask your health care provider.  Do not drink alcohol.  Take medicines only as directed by your health care provider.  Keep all follow-up visits as directed by your health care provider. This is important. This includes any further testing if your chest pain does not go away.  If heartburn is the cause for your chest pain, you may be told to keep your head raised (elevated) while sleeping. This reduces the chance that acid will go from your stomach into your esophagus.  Make lifestyle changes as directed by your health care provider. These may include:  Getting regular exercise. Ask your health care provider to suggest some activities that are safe for you.  Eating a heart-healthy diet. A registered dietitian can help you to learn healthy eating options.  Maintaining a healthy weight.  Managing diabetes, if necessary.  Reducing stress. SEEK MEDICAL CARE IF:  Your chest pain does not go away after treatment.  You have a rash with blisters on your chest.  You have a fever. SEEK IMMEDIATE MEDICAL CARE IF:   Your chest pain is worse.  You have an increasing cough, or you cough up blood.  You have severe abdominal pain.  You have severe weakness.  You faint.  You have chills.  You have sudden, unexplained chest discomfort.  You have sudden, unexplained discomfort in your arms, back, neck, or jaw.  You have shortness of breath at any time.  You suddenly start to sweat, or your skin gets clammy.  You feel  nauseous or you vomit.  You suddenly feel light-headed or dizzy.  Your heart begins to beat quickly, or it feels like it is skipping beats. These symptoms may represent a serious problem that is an emergency. Do not wait to see if the symptoms will go away. Get medical help right away. Call your local emergency services (911 in the [place] Do not drive yourself to the hospital.   This information is not intended to replace advice given to you by your health care provider. Make sure you discuss any questions you have with your health care provider.   Document Released: 12/31/2004 Document Revised: 04/13/2014 Document Reviewed: 10/27/2013 Elsevier Interactive Patient Education [employer].

## 2015-06-20 DIAGNOSIS — E539 Vitamin B deficiency, unspecified: Secondary | ICD-10-CM | POA: Diagnosis not present

## 2015-07-12 ENCOUNTER — Ambulatory Visit: Payer: Medicare Other | Admitting: Psychiatry

## 2015-07-17 ENCOUNTER — Ambulatory Visit (INDEPENDENT_AMBULATORY_CARE_PROVIDER_SITE_OTHER): Payer: Medicare Other | Admitting: Psychiatry

## 2015-07-17 ENCOUNTER — Encounter: Payer: Self-pay | Admitting: Psychiatry

## 2015-07-17 VITALS — BP 122/80 | HR 85 | Temp 97.4°F | Ht 66.0 in | Wt 187.0 lb

## 2015-07-17 DIAGNOSIS — F331 Major depressive disorder, recurrent, moderate: Secondary | ICD-10-CM

## 2015-07-17 DIAGNOSIS — F4310 Post-traumatic stress disorder, unspecified: Secondary | ICD-10-CM

## 2015-07-17 MED ORDER — DULOXETINE HCL 20 MG PO CPEP: 20.0000 mg | ORAL_CAPSULE | Freq: Every day | ORAL | Status: DC

## 2015-07-17 MED ORDER — TRAZODONE HCL 100 MG PO TABS: 100.0000 mg | ORAL_TABLET | Freq: Every day | ORAL | Status: DC

## 2015-07-17 NOTE — Progress Notes (Signed)
Patient ID: Teresa Crawford, female   DOB: [birth year], 30 y.o.   MRN: [id number] [facility] MD/PA/NP OP Progress Note  07/17/2015 11:32 AM Teresa Crawford  MRN:  [id number]  Subjective:  Follow-up of her posttraumatic stress disorder and major depressive disorder, recurrent moderate. She was previously seen by Dr. Jimmye Norman and this is the first appointment for this patient with this clinician. She is seen today with her caregiver of 2 years. Reports that she is doing quite well. She is now in independent living and also has a boyfriend. States that she was just starting a job at [employer]  soon. She also states that the she enjoys spending time with her dog and watching TV. Doing well on her current medication regimen. Denies any side effects. Denies any suicidal thoughts. Sleeping well with the help of trazodone.   Chief Complaint: Doing better  Visit Diagnosis:     ICD-9-CM ICD-10-CM   1. Major depressive disorder, recurrent episode, moderate (HCC) 296.32 F33.1   2. PTSD (post-traumatic stress disorder) 309.81 F43.10     Past Medical History:  Past Medical History  Diagnosis Date  . Migraine   . Mental retardation   . Posttraumatic stress disorder   . Depression   . Hepatic steatosis   . Splenomegaly   . Elevated liver enzymes   . Uterine fibroid   . Endometriosis   . Chronic back pain   . Hypothyroid   . Chronic fatigue syndrome   . Scoliosis   . PUD (peptic ulcer disease)     Past Surgical History  Procedure Laterality Date  . Back surgery    . Laparoscopic endometriosis fulguration  2015    [name]  . Colonoscopy  04/27/2006    Wohl-colitis, internal hemorrhoids  . Esophagogastroduodenoscopy  02/2013    multiple peptic & duodenal ulcers, negative h pylori  . Colonoscopy  02/2013    focal right colitis, left colon biopsy negative, focal active proctitis without chronicity   Family History:  Family History  Problem Relation Age of Onset  . Breast cancer Mother   . COPD  Father   . Lung cancer Father   . Dementia Father   . Diabetes Sister   . Diabetes Brother   . Ovarian cancer Paternal Aunt   . Colon cancer Maternal Grandmother   . Liver disease Maternal Aunt     ?etiology   Social History:  Social History   Social History  . Marital Status: Single    Spouse Name: N/A  . Number of Children: 0  . Years of Education: N/A   Social History Main Topics  . Smoking status: Never Smoker   . Smokeless tobacco: Never Used  . Alcohol Use: No  . Drug Use: No  . Sexual Activity: No   Other Topics Concern  . Not on file   Social History Narrative   She has brother, half brother & sister live nearby   Both parents deceased   Care managed by [name]   Additional History:   Assessment:   Musculoskeletal: Strength & Muscle Tone: within normal limits Gait & Station: normal Patient leans: N/A  Psychiatric Specialty Exam: Depression        Associated symptoms include does not have insomnia and no suicidal ideas.   Review of Systems  Endo/Heme/Allergies:       Patient reports that have an ultrasound of her thyroid.  Psychiatric/Behavioral: Negative for depression, suicidal ideas, hallucinations, memory loss and substance abuse. The patient is  not nervous/anxious and does not have insomnia.   All other systems reviewed and are negative.   There were no vitals taken for this visit.There is no weight on file to calculate BMI.  General Appearance: Fairly Groomed  Eye Contact:  Fair  Speech:  Clear and Coherent and Normal Rate easily engaged, provides more spontaneous speech   Volume:  Normal  Mood:  Good  Affect:  Congruent smiling and able to laugh   Thought Process:  Concrete  Orientation:  Full (Time, Place, and Person)  Thought Content:  Negative  Suicidal Thoughts:  No  Homicidal Thoughts:  No  Memory:  Immediate;   Good Recent;   Good Remote;   Good  Judgement:  Fair  Insight:  Fair  Psychomotor Activity:  Negative   Concentration:  Good  Recall:  Good  Fund of Knowledge: Fair  Language: Fair  Akathisia:  Negative  Handed:  Right unknown  AIMS (if indicated):  Not done  Assets:  Social Support  ADL's:  Intact  Cognition: Impaired,  Moderate  Sleep:  Good    Is the patient at risk to self?  No. Has the patient been a risk to self in the past 6 months?  No. Has the patient been a risk to self within the distant past?  No. Is the patient a risk to others?  No. Has the patient been a risk to others in the past 6 months?  No. Has the patient been a risk to others within the distant past?  No.  Current Medications: Current Outpatient Prescriptions  Medication Sig Dispense Refill  . Butalbital-APAP-Caffeine 50-300-40 MG CAPS Take by mouth.    . Cholecalciferol (D 1000) 1000 UNITS capsule Take by mouth.    . diazepam (VALIUM) 5 MG tablet Insert one tablet into the vagina at bedtime    . docusate sodium (COLACE) 100 MG capsule Take by mouth.    . DULoxetine (CYMBALTA) 20 MG capsule Take 1 capsule (20 mg total) by mouth daily. 30 capsule 4  . EPINEPHrine (EPIPEN 2-PAK) 0.3 mg/0.3 mL IJ SOAJ injection Inject into the muscle.    . fexofenadine (ALLEGRA) 180 MG tablet Take 180 mg by mouth daily.    . fluticasone (FLONASE) 50 MCG/ACT nasal spray Place 1 spray into both nostrils daily.    Marland Kitchen ibuprofen (ADVIL,MOTRIN) 600 MG tablet Take 600 mg by mouth every 6 (six) hours as needed.    Marland Kitchen leuprolide (LUPRON DEPOT) 11.25 MG injection Inject into the muscle.    . naproxen (NAPROSYN) 500 MG tablet Take 1 tablet (500 mg total) by mouth 2 (two) times daily with a meal. 20 tablet 2  . Norethin-Eth Estradiol-Fe [facility] FE,WYMZYA Orrin Brigham) 0.4-35 MG-MCG tablet Chew by mouth.    Marland Kitchen omeprazole (PRILOSEC) 20 MG capsule Take by mouth.    . ondansetron (ZOFRAN) 8 MG tablet Take 8 mg by mouth every 8 (eight) hours as needed for nausea or vomiting.    . promethazine (PHENERGAN) 12.5 MG tablet Take by mouth.    .  SUMAtriptan (IMITREX) 50 MG tablet Take by mouth.    . topiramate (TOPAMAX) 50 MG tablet Take 25 mg by mouth.     . traMADol (ULTRAM) 50 MG tablet Take 100 mg by mouth every 6 (six) hours as needed. For pain.    . traZODone (DESYREL) 100 MG tablet Take 1 tablet (100 mg total) by mouth at bedtime. 30 tablet 4  . Vitamin D, Ergocalciferol, (DRISDOL) 50000 UNITS CAPS capsule  Take by mouth.     No current facility-administered medications for this visit.    Medical Decision Making:  Established Problem, Stable/Improving (1)  Treatment Plan Summary:Medication management   Post traumatic stress disorder- Continue  Cymbalta 20 mg daily   Major depressive disorder, recurrent, moderate- mood is bright. Cymbalta as above.   Continue trazodone at 100 mg at bedtime for sleep   Patient is also on Valium prescribed by another doctor for pelvic muscle spasms.  Patient will return in  3 months. She has  been encouraged call with any questions or concerns prior to her next appointment.  Teresa Crawford 07/17/2015, 11:32 AM

## 2015-07-22 ENCOUNTER — Ambulatory Visit: Payer: Medicare Other | Admitting: Psychiatry

## 2015-07-25 DIAGNOSIS — G43009 Migraine without aura, not intractable, without status migrainosus: Secondary | ICD-10-CM | POA: Diagnosis not present

## 2015-07-25 DIAGNOSIS — E539 Vitamin B deficiency, unspecified: Secondary | ICD-10-CM | POA: Diagnosis not present

## 2015-07-25 DIAGNOSIS — F330 Major depressive disorder, recurrent, mild: Secondary | ICD-10-CM | POA: Diagnosis not present

## 2015-07-25 DIAGNOSIS — Z0001 Encounter for general adult medical examination with abnormal findings: Secondary | ICD-10-CM | POA: Diagnosis not present

## 2015-07-25 DIAGNOSIS — J309 Allergic rhinitis, unspecified: Secondary | ICD-10-CM | POA: Diagnosis not present

## 2015-07-25 DIAGNOSIS — F5101 Primary insomnia: Secondary | ICD-10-CM | POA: Diagnosis not present

## 2015-07-25 DIAGNOSIS — Z803 Family history of malignant neoplasm of breast: Secondary | ICD-10-CM | POA: Diagnosis not present

## 2015-08-28 DIAGNOSIS — E539 Vitamin B deficiency, unspecified: Secondary | ICD-10-CM | POA: Diagnosis not present

## 2015-09-05 DIAGNOSIS — M79674 Pain in right toe(s): Secondary | ICD-10-CM | POA: Diagnosis not present

## 2015-09-05 DIAGNOSIS — M79675 Pain in left toe(s): Secondary | ICD-10-CM | POA: Diagnosis not present

## 2015-09-05 DIAGNOSIS — B351 Tinea unguium: Secondary | ICD-10-CM | POA: Diagnosis not present

## 2015-09-12 DIAGNOSIS — Z789 Other specified health status: Secondary | ICD-10-CM

## 2015-09-12 HISTORY — DX: Other specified health status: Z78.9

## 2015-09-25 DIAGNOSIS — E539 Vitamin B deficiency, unspecified: Secondary | ICD-10-CM | POA: Diagnosis not present

## 2015-09-26 ENCOUNTER — Encounter: Payer: Self-pay | Admitting: Oncology

## 2015-09-26 ENCOUNTER — Inpatient Hospital Stay: Payer: Medicare Other | Attending: Oncology | Admitting: Oncology

## 2015-09-26 DIAGNOSIS — G8929 Other chronic pain: Secondary | ICD-10-CM | POA: Insufficient documentation

## 2015-09-26 DIAGNOSIS — Z79899 Other long term (current) drug therapy: Secondary | ICD-10-CM | POA: Diagnosis not present

## 2015-09-26 DIAGNOSIS — M419 Scoliosis, unspecified: Secondary | ICD-10-CM | POA: Diagnosis not present

## 2015-09-26 DIAGNOSIS — Z801 Family history of malignant neoplasm of trachea, bronchus and lung: Secondary | ICD-10-CM | POA: Diagnosis not present

## 2015-09-26 DIAGNOSIS — Z8669 Personal history of other diseases of the nervous system and sense organs: Secondary | ICD-10-CM | POA: Diagnosis not present

## 2015-09-26 DIAGNOSIS — Z315 Encounter for genetic counseling: Secondary | ICD-10-CM | POA: Insufficient documentation

## 2015-09-26 DIAGNOSIS — R531 Weakness: Secondary | ICD-10-CM | POA: Insufficient documentation

## 2015-09-26 DIAGNOSIS — Z803 Family history of malignant neoplasm of breast: Secondary | ICD-10-CM | POA: Diagnosis not present

## 2015-09-26 DIAGNOSIS — R5383 Other fatigue: Secondary | ICD-10-CM | POA: Diagnosis not present

## 2015-09-26 DIAGNOSIS — K760 Fatty (change of) liver, not elsewhere classified: Secondary | ICD-10-CM | POA: Diagnosis not present

## 2015-09-26 DIAGNOSIS — Z807 Family history of other malignant neoplasms of lymphoid, hematopoietic and related tissues: Secondary | ICD-10-CM | POA: Insufficient documentation

## 2015-09-26 DIAGNOSIS — E039 Hypothyroidism, unspecified: Secondary | ICD-10-CM | POA: Diagnosis not present

## 2015-09-26 DIAGNOSIS — F79 Unspecified intellectual disabilities: Secondary | ICD-10-CM | POA: Diagnosis not present

## 2015-09-26 DIAGNOSIS — N809 Endometriosis, unspecified: Secondary | ICD-10-CM | POA: Diagnosis not present

## 2015-09-26 DIAGNOSIS — F329 Major depressive disorder, single episode, unspecified: Secondary | ICD-10-CM

## 2015-09-26 DIAGNOSIS — Z8041 Family history of malignant neoplasm of ovary: Secondary | ICD-10-CM | POA: Diagnosis not present

## 2015-09-26 DIAGNOSIS — Z800 Family history of malignant neoplasm of digestive organs: Secondary | ICD-10-CM | POA: Diagnosis not present

## 2015-09-26 NOTE — Progress Notes (Signed)
States is having abd pain today due to endometriosis. Feels nervous about appt today.

## 2015-10-06 NOTE — Progress Notes (Signed)
[place]  [phone] [phone]) [phone]  ID: Teresa Crawford OB: [birth year]  MR#: [id number]  [id number]  Patient Care Team: Lavera Guise, MD as PCP - General (Internal Medicine)  CHIEF COMPLAINT:  Chief Complaint  Patient presents with  . Genetic testing    INTERVAL HISTORY: Patient is a 30 year old female with no personal history of malignancy, but has been referred to genetic testing for a significant family history of breast as well as GI malignancies. Patient appears depressed today. She is also anxious about her appointment. She has chronic abdominal pain secondary to endometriosis. She has no neurologic complaints. She denies any recent fevers or illnesses. She denies any weight loss. She has no chest pain or shortness of breath. She denies any nausea, vomiting, constipation, or diarrhea. She has no urinary complaints. Patient otherwise feels well and offers no further specific complaints.  REVIEW OF SYSTEMS:   Review of Systems  Constitutional: Positive for malaise/fatigue. Negative for fever and weight loss.  Respiratory: Negative.  Negative for cough and shortness of breath.   Cardiovascular: Negative.  Negative for chest pain.  Genitourinary: Negative.   Musculoskeletal: Positive for back pain.  Neurological: Positive for weakness.  Psychiatric/Behavioral: Positive for depression. The patient is nervous/anxious.     As per HPI. Otherwise, a complete review of systems is negatve.  PAST MEDICAL HISTORY: Past Medical History  Diagnosis Date  . Migraine   . Mental retardation   . Posttraumatic stress disorder   . Depression   . Hepatic steatosis   . Splenomegaly   . Elevated liver enzymes   . Uterine fibroid   . Endometriosis   . Chronic back pain   . Hypothyroid   . Chronic fatigue syndrome   . Scoliosis   . PUD (peptic ulcer disease)   . Last menstrual period (LMP) > 10 days ago 09/12/15    PAST SURGICAL HISTORY: Past  Surgical History  Procedure Laterality Date  . Back surgery    . Laparoscopic endometriosis fulguration  2015    [name]  . Colonoscopy  04/27/2006    Wohl-colitis, internal hemorrhoids  . Esophagogastroduodenoscopy  02/2013    multiple peptic & duodenal ulcers, negative h pylori  . Colonoscopy  02/2013    focal right colitis, left colon biopsy negative, focal active proctitis without chronicity    FAMILY HISTORY Family History  Problem Relation Age of Onset  . Breast cancer Mother 61  . COPD Father   . Lung cancer Father 12  . Dementia Father   . Diabetes Sister   . Diabetes Brother   . Ovarian cancer Paternal Aunt   . Colon cancer Maternal Grandfather 26  . Liver disease Maternal Aunt     ?etiology  . Lymphoma Maternal Grandmother 60  . Lung cancer Other 34    maternal great grandfather  . Stomach cancer Other 14    maternal great grandmother       ADVANCED DIRECTIVES:    HEALTH MAINTENANCE: Social History  Substance Use Topics  . Smoking status: Never Smoker   . Smokeless tobacco: Never Used  . Alcohol Use: No     Colonoscopy:  PAP:  Bone density:  Lipid panel:  Allergies  Allergen Reactions  . Peanut Butter Flavor Swelling    Throat swells  . Codeine Nausea And Vomiting    nausea  . Paroxetine     Patient states the cause of a panic attack but denies an actual physical injury.  . Paxil [  Paroxetine Hcl] Other (See Comments)    Panic attacks  . Venlafaxine     Patient states it causes a panic attack but denies it ever caused a physical manifestation  . Zoloft [Sertraline Hcl] Other (See Comments)    Panic attacks    Current Outpatient Prescriptions  Medication Sig Dispense Refill  . cyanocobalamin (,VITAMIN B-12,) 1000 MCG/ML injection Inject 1 mL into the muscle every 30 (thirty) days.    . diazepam (VALIUM) 5 MG tablet Insert one tablet into the vagina at bedtime    . docusate sodium (COLACE) 100 MG capsule Take 100 mg by mouth 2 (two) times  daily.     . DULoxetine (CYMBALTA) 20 MG capsule Take 1 capsule (20 mg total) by mouth daily. 30 capsule 4  . EPINEPHrine (EPIPEN 2-PAK) 0.3 mg/0.3 mL IJ SOAJ injection Inject into the muscle.    . fluticasone (FLONASE) 50 MCG/ACT nasal spray Place 1 spray into both nostrils daily.    Marland Kitchen omeprazole (PRILOSEC) 20 MG capsule Take 20 mg by mouth 2 (two) times daily before a meal.     . ondansetron (ZOFRAN) 8 MG tablet Take 8 mg by mouth every 8 (eight) hours as needed for nausea or vomiting.    . promethazine (PHENERGAN) 12.5 MG tablet Take 12.5 mg by mouth every 6 (six) hours as needed.     . topiramate (TOPAMAX) 50 MG tablet Take 25 mg by mouth.     . traMADol (ULTRAM) 50 MG tablet Take 100 mg by mouth every 6 (six) hours as needed. For pain.    . traZODone (DESYREL) 100 MG tablet Take 1 tablet (100 mg total) by mouth at bedtime. 30 tablet 4   No current facility-administered medications for this visit.    OBJECTIVE: Filed Vitals:   09/26/15 1515  BP: 120/83  Pulse: 65  Temp: 91.7 F (33.2 C)  Resp: 18     Body mass index is 30.05 kg/(m^2).    ECOG FS:0 - Asymptomatic  General: Well-developed, well-nourished, no acute distress. Eyes: Pink conjunctiva, anicteric sclera. Musculoskeletal: No edema, cyanosis, or clubbing. Neuro: Alert, answering all questions appropriately. Cranial nerves grossly intact. Skin: No rashes or petechiae noted. Psych: Flat affect.   LAB RESULTS:  Lab Results  Component Value Date   NA 140 06/14/2015   K 3.7 06/14/2015   CL 109 06/14/2015   CO2 24 06/14/2015   GLUCOSE 106* 06/14/2015   BUN 8 06/14/2015   CREATININE 0.60 06/14/2015   CALCIUM 9.1 06/14/2015   PROT 7.0 10/12/2014   ALBUMIN 3.5 10/12/2014   AST 12* 10/12/2014   ALT 9* 10/12/2014   ALKPHOS 89 10/12/2014   BILITOT 0.2* 10/12/2014   GFRNONAA >60 06/14/2015   GFRAA >60 06/14/2015    Lab Results  Component Value Date   WBC 5.8 06/14/2015   NEUTROABS 3.9 05/05/2015   HGB 13.0  06/14/2015   HCT 38.2 06/14/2015   MCV 82.7 06/14/2015   PLT 182 06/14/2015     STUDIES: No results found.  ASSESSMENT: Genetic testing for family history of breast and GI malignancy.  PLAN:    1. Genetic testing: Although patient does not have a personal history of malignancy, her mother is deceased from breast cancer at the age of 57. She was initially diagnosed in her 65s. She also reports several cousins with breast cancer. She is unclear of their ages, but states they were "young".  Patient also has a maternal grandfather with colon cancer and a great maternal  grandmother with stomach cancer. Her father is deceased from lung cancer. Given her family history, patient will benefit from genetic testing. Will send the My Risk panel to assess not only for breast cancer risk, but given her family history of GI malignancies can assess for Lynch syndrome as well. If results are negative, patient expressed understanding that she is still at increased risk for developing malignancy, particularly breast, given her family history. She will require routine yearly mammograms as well as regular colonoscopies. If her genetic test returns positive, she will return to clinic for further evaluation and discussion of prophylactic measures she can take to minimize her risk of developing malignancy. No follow-up has been scheduled at this time.  Approximately 45 minutes was spent in discussion of which greater than 50% was consultation.  Patient expressed understanding and was in agreement with this plan. She also understands that She can call clinic at any time with any questions, concerns, or complaints.   Lloyd Huger, MD   10/06/2015 7:46 AM

## 2015-10-16 ENCOUNTER — Ambulatory Visit (INDEPENDENT_AMBULATORY_CARE_PROVIDER_SITE_OTHER): Payer: Medicare Other | Admitting: Psychiatry

## 2015-10-16 DIAGNOSIS — F331 Major depressive disorder, recurrent, moderate: Secondary | ICD-10-CM | POA: Diagnosis not present

## 2015-10-16 DIAGNOSIS — F4310 Post-traumatic stress disorder, unspecified: Secondary | ICD-10-CM

## 2015-10-16 DIAGNOSIS — F79 Unspecified intellectual disabilities: Secondary | ICD-10-CM

## 2015-10-16 MED ORDER — DULOXETINE HCL 20 MG PO CPEP: 20.0000 mg | ORAL_CAPSULE | Freq: Every day | ORAL | Status: DC

## 2015-10-16 MED ORDER — TRAZODONE HCL 50 MG PO TABS: 50.0000 mg | ORAL_TABLET | Freq: Every day | ORAL | Status: DC

## 2015-10-16 NOTE — Progress Notes (Signed)
Patient ID: Teresa Crawford, female   DOB: 13-Sep-1985, 30 y.o.   MRN: [id number]  [facility] MD/PA/NP OP Progress Note  10/16/2015 11:40 AM Teresa Crawford  MRN:  [id number]  Subjective:  Follow-up of her posttraumatic stress disorder and major depressive disorder, recurrent moderate. Reports doing well. She likes her job at [employer]. She also states that the she enjoys spending time with her dog and watching TV. Doing well on her current medication regimen. Denies any side effects. Denies any suicidal thoughts. Sleeping well with the help of trazodone, reports that the 100 mg is too much for her and she is currently taking 50 mg.   Chief Complaint: Doing better  Visit Diagnosis:     ICD-9-CM ICD-10-CM   1. Major depressive disorder, recurrent episode, moderate (HCC) 296.32 F33.1   2. PTSD (post-traumatic stress disorder) 309.81 F43.10   3. Intellectual disability [address]     Past Medical History:  Past Medical History  Diagnosis Date  . Migraine   . Mental retardation   . Posttraumatic stress disorder   . Depression   . Hepatic steatosis   . Splenomegaly   . Elevated liver enzymes   . Uterine fibroid   . Endometriosis   . Chronic back pain   . Hypothyroid   . Chronic fatigue syndrome   . Scoliosis   . PUD (peptic ulcer disease)   . Last menstrual period (LMP) > 10 days ago 09/12/15    Past Surgical History  Procedure Laterality Date  . Back surgery    . Laparoscopic endometriosis fulguration  2015    [name]  . Colonoscopy  04/27/2006    Wohl-colitis, internal hemorrhoids  . Esophagogastroduodenoscopy  02/2013    multiple peptic & duodenal ulcers, negative h pylori  . Colonoscopy  02/2013    focal right colitis, left colon biopsy negative, focal active proctitis without chronicity   Family History:  Family History  Problem Relation Age of Onset  . Breast cancer Mother 41  . COPD Father   . Lung cancer Father 48  . Dementia Father   . Diabetes Sister   . Diabetes Brother    . Ovarian cancer Paternal Aunt   . Colon cancer Maternal Grandfather 65  . Liver disease Maternal Aunt     ?etiology  . Lymphoma Maternal Grandmother 60  . Lung cancer Other 70    maternal great grandfather  . Stomach cancer Other 10    maternal great grandmother   Social History:  Social History   Social History  . Marital Status: Single    Spouse Name: N/A  . Number of Children: 0  . Years of Education: N/A   Social History Main Topics  . Smoking status: Never Smoker   . Smokeless tobacco: Never Used  . Alcohol Use: No  . Drug Use: No  . Sexual Activity: No   Other Topics Concern  . Not on file   Social History Narrative   She has brother, half brother & sister live nearby   Both parents deceased   Care managed by [name]   Additional History:   Assessment:   Musculoskeletal: Strength & Muscle Tone: within normal limits Gait & Station: normal Patient leans: N/A  Psychiatric Specialty Exam: Depression        Associated symptoms include does not have insomnia and no suicidal ideas.   Review of Systems  Endo/Heme/Allergies:       Patient reports that have an ultrasound of her thyroid.  Psychiatric/Behavioral: Negative for depression, suicidal ideas, hallucinations, memory loss and substance abuse. The patient is not nervous/anxious and does not have insomnia.   All other systems reviewed and are negative.   There were no vitals taken for this visit.There is no weight on file to calculate BMI.  General Appearance: Fairly Groomed  Eye Contact:  Fair  Speech:  Clear and Coherent and Normal Rate  Volume:  Normal  Mood:  Good  Affect:  Congruent smiling and able to laugh   Thought Process:  Concrete  Orientation:  Full (Time, Place, and Person)  Thought Content:  Negative  Suicidal Thoughts:  No  Homicidal Thoughts:  No  Memory:  Immediate;   Good Recent;   Good Remote;   Good  Judgement:  Fair  Insight:  Fair  Psychomotor Activity:  Negative   Concentration:  Good  Recall:  Good  Fund of Knowledge: Fair  Language: Fair  Akathisia:  Negative  Handed:  Right unknown  AIMS (if indicated):  Not done  Assets:  Social Support  ADL's:  Intact  Cognition: Impaired,  Moderate  Sleep:  Good    Is the patient at risk to self?  No. Has the patient been a risk to self in the past 6 months?  No. Has the patient been a risk to self within the distant past?  No. Is the patient a risk to others?  No. Has the patient been a risk to others in the past 6 months?  No. Has the patient been a risk to others within the distant past?  No.  Current Medications: Current Outpatient Prescriptions  Medication Sig Dispense Refill  . cyanocobalamin (,VITAMIN B-12,) 1000 MCG/ML injection Inject 1 mL into the muscle every 30 (thirty) days.    . diazepam (VALIUM) 5 MG tablet Insert one tablet into the vagina at bedtime    . docusate sodium (COLACE) 100 MG capsule Take 100 mg by mouth 2 (two) times daily.     . DULoxetine (CYMBALTA) 20 MG capsule Take 1 capsule (20 mg total) by mouth daily. 30 capsule 4  . EPINEPHrine (EPIPEN 2-PAK) 0.3 mg/0.3 mL IJ SOAJ injection Inject into the muscle.    . fluticasone (FLONASE) 50 MCG/ACT nasal spray Place 1 spray into both nostrils daily.    Marland Kitchen omeprazole (PRILOSEC) 20 MG capsule Take 20 mg by mouth 2 (two) times daily before a meal.     . ondansetron (ZOFRAN) 8 MG tablet Take 8 mg by mouth every 8 (eight) hours as needed for nausea or vomiting.    . promethazine (PHENERGAN) 12.5 MG tablet Take 12.5 mg by mouth every 6 (six) hours as needed.     . topiramate (TOPAMAX) 50 MG tablet Take 25 mg by mouth.     . traMADol (ULTRAM) 50 MG tablet Take 100 mg by mouth every 6 (six) hours as needed. For pain.    . traZODone (DESYREL) 100 MG tablet Take 1 tablet (100 mg total) by mouth at bedtime. 30 tablet 4   No current facility-administered medications for this visit.    Medical Decision Making:  Established Problem,  Stable/Improving (1)  Treatment Plan Summary:Medication management   Post traumatic stress disorder- Continue  Cymbalta 20 mg daily   Major depressive disorder, recurrent, moderate- mood is bright. Cymbalta as above.   Decrease trazodone to 50 mg since patient has been too sedated at the 100 mg dose  Patient is also on Valium prescribed by another doctor for pelvic muscle spasms.  Patient will return in  3 months. She has  been encouraged call with any questions or concerns prior to her next appointment.  Amilcar Reever 10/16/2015, 11:40 AM

## 2015-10-21 ENCOUNTER — Telehealth: Payer: Self-pay | Admitting: *Deleted

## 2015-10-21 NOTE — Telephone Encounter (Signed)
I spoke with patient regarding [facility] genetic testing, testing is not covered in full by her insurance. Patient does not wish to proceed with testing at this time as she can not pay the out of pocket expense. I notified Tav at [facility] that patient does not wish to proceed with testing.

## 2015-10-26 ENCOUNTER — Emergency Department: Payer: Medicare Other

## 2015-10-26 ENCOUNTER — Emergency Department
Admission: EM | Admit: 2015-10-26 | Discharge: 2015-10-27 | Disposition: A | Discharge status: Home / Self Care | Payer: Medicare Other | Attending: Emergency Medicine | Admitting: Emergency Medicine

## 2015-10-26 ENCOUNTER — Encounter: Payer: Self-pay | Admitting: Radiology

## 2015-10-26 DIAGNOSIS — Z91010 Allergy to peanuts: Secondary | ICD-10-CM | POA: Diagnosis not present

## 2015-10-26 DIAGNOSIS — E039 Hypothyroidism, unspecified: Secondary | ICD-10-CM | POA: Insufficient documentation

## 2015-10-26 DIAGNOSIS — Z7951 Long term (current) use of inhaled steroids: Secondary | ICD-10-CM | POA: Insufficient documentation

## 2015-10-26 DIAGNOSIS — K920 Hematemesis: Secondary | ICD-10-CM | POA: Diagnosis not present

## 2015-10-26 DIAGNOSIS — R102 Pelvic and perineal pain: Secondary | ICD-10-CM | POA: Diagnosis not present

## 2015-10-26 DIAGNOSIS — Z79899 Other long term (current) drug therapy: Secondary | ICD-10-CM | POA: Diagnosis not present

## 2015-10-26 DIAGNOSIS — F331 Major depressive disorder, recurrent, moderate: Secondary | ICD-10-CM | POA: Insufficient documentation

## 2015-10-26 DIAGNOSIS — M419 Scoliosis, unspecified: Secondary | ICD-10-CM | POA: Insufficient documentation

## 2015-10-26 DIAGNOSIS — R112 Nausea with vomiting, unspecified: Secondary | ICD-10-CM | POA: Diagnosis not present

## 2015-10-26 DIAGNOSIS — B9689 Other specified bacterial agents as the cause of diseases classified elsewhere: Secondary | ICD-10-CM | POA: Diagnosis not present

## 2015-10-26 DIAGNOSIS — R109 Unspecified abdominal pain: Secondary | ICD-10-CM | POA: Diagnosis not present

## 2015-10-26 DIAGNOSIS — D251 Intramural leiomyoma of uterus: Secondary | ICD-10-CM | POA: Diagnosis not present

## 2015-10-26 DIAGNOSIS — N760 Acute vaginitis: Secondary | ICD-10-CM | POA: Diagnosis not present

## 2015-10-26 DIAGNOSIS — D259 Leiomyoma of uterus, unspecified: Secondary | ICD-10-CM | POA: Diagnosis not present

## 2015-10-26 LAB — URINALYSIS COMPLETE WITH MICROSCOPIC (ARMC ONLY)
Bacteria, UA: NONE SEEN
Bilirubin Urine: NEGATIVE
Glucose, UA: NEGATIVE mg/dL
Hgb urine dipstick: NEGATIVE
KETONES UR: NEGATIVE mg/dL
Nitrite: NEGATIVE
PH: 6 (ref 5.0–8.0)
PROTEIN: 100 mg/dL — AB
Specific Gravity, Urine: 1.031 — ABNORMAL HIGH (ref 1.005–1.030)

## 2015-10-26 LAB — COMPREHENSIVE METABOLIC PANEL
ALT: 19 U/L (ref 14–54)
ANION GAP: 8 (ref 5–15)
AST: 19 U/L (ref 15–41)
Albumin: 3.6 g/dL (ref 3.5–5.0)
Alkaline Phosphatase: 128 U/L — ABNORMAL HIGH (ref 38–126)
BUN: 8 mg/dL (ref 6–20)
CHLORIDE: 104 mmol/L (ref 101–111)
CO2: 26 mmol/L (ref 22–32)
Calcium: 8.8 mg/dL — ABNORMAL LOW (ref 8.9–10.3)
Creatinine, Ser: 0.85 mg/dL (ref 0.44–1.00)
GFR calc non Af Amer: >60 mL/min (ref >60)
GLUCOSE: 101 mg/dL — AB (ref 65–99)
Potassium: 3.5 mmol/L (ref 3.5–5.1)
Sodium: 138 mmol/L (ref 135–145)
Total Bilirubin: 0.6 mg/dL (ref 0.3–1.2)
Total Protein: 6.8 g/dL (ref 6.5–8.1)

## 2015-10-26 LAB — LIPASE, BLOOD: LIPASE: 19 U/L (ref 11–51)

## 2015-10-26 LAB — WET PREP, GENITAL
Sperm: NONE SEEN
TRICH WET PREP: NONE SEEN
Yeast Wet Prep HPF POC: NONE SEEN

## 2015-10-26 LAB — CBC
HEMATOCRIT: 38.9 % (ref 35.0–47.0)
HEMOGLOBIN: 13.1 g/dL (ref 12.0–16.0)
MCH: 27 pg (ref 26.0–34.0)
MCHC: 33.7 g/dL (ref 32.0–36.0)
MCV: 80.1 fL (ref 80.0–100.0)
Platelets: 188 10*3/uL (ref 150–440)
RBC: 4.85 MIL/uL (ref 3.80–5.20)
RDW: 14.8 % — ABNORMAL HIGH (ref 11.5–14.5)
WBC: 6.5 10*3/uL (ref 3.6–11.0)

## 2015-10-26 LAB — POCT PREGNANCY, URINE: Preg Test, Ur: NEGATIVE

## 2015-10-26 LAB — TYPE AND SCREEN
ABO/RH(D): A POS
Antibody Screen: NEGATIVE

## 2015-10-26 MED ORDER — ONDANSETRON HCL 4 MG/2ML IJ SOLN
INTRAMUSCULAR | Status: AC
  Administered 2015-10-26: 4 mg via INTRAVENOUS
  Filled 2015-10-26: qty 2

## 2015-10-26 MED ORDER — DIATRIZOATE MEGLUMINE & SODIUM 66-10 % PO SOLN
15.0000 mL | ORAL | Status: AC
  Administered 2015-10-26 (×2): 15 mL via ORAL

## 2015-10-26 MED ORDER — IOPAMIDOL (ISOVUE-300) INJECTION 61%
100.0000 mL | Freq: Once | INTRAVENOUS | Status: AC | PRN
  Administered 2015-10-26: 100 mL via INTRAVENOUS

## 2015-10-26 MED ORDER — METRONIDAZOLE 500 MG PO TABS: 500.0000 mg | ORAL_TABLET | Freq: Two times a day (BID) | ORAL | Status: AC

## 2015-10-26 MED ORDER — ONDANSETRON HCL 4 MG/2ML IJ SOLN
4.0000 mg | Freq: Once | INTRAMUSCULAR | Status: AC
  Administered 2015-10-26: 4 mg via INTRAVENOUS

## 2015-10-26 NOTE — ED Provider Notes (Signed)
[facility] Emergency Department Provider Note    ____________________________________________  Time seen: ~2215  I have reviewed the triage vital signs and the nursing notes.   HISTORY  Chief Complaint Abdominal Pain and Hematemesis   History limited by: Not Limited   HPI Teresa Crawford is a 30 y.o. female who presents to the emergency department today because of concerns for low quadrant pain. The symptoms started yesterday. They have been persistent. They're constant. She feels like pressure to that area does give some relief. She has not noticed any dysuria. No abnormal vaginal discharge. Patient does not know 70 diarrhea. She states she has a couple episodes of vomiting some with blood in it. No upper abdominal pain. No fevers.    Past Medical History  Diagnosis Date  . Migraine   . Mental retardation   . Posttraumatic stress disorder   . Depression   . Hepatic steatosis   . Splenomegaly   . Elevated liver enzymes   . Uterine fibroid   . Endometriosis   . Chronic back pain   . Hypothyroid   . Chronic fatigue syndrome   . Scoliosis   . PUD (peptic ulcer disease)   . Last menstrual period (LMP) > 10 days ago 09/12/15    Patient Active Problem List   Diagnosis Date Noted  . Major depressive disorder, recurrent episode, moderate ([place]) 10/29/2014  . PTSD (post-traumatic stress disorder) 10/29/2014  . Grief 10/29/2014  . CFIDS (chronic fatigue and immune dysfunction syndrome) 09/28/2014  . Aggrieved 09/28/2014  . Depression, major, recurrent ([place]) 09/28/2014  . Intellectual disability 09/28/2014  . Neurosis, posttraumatic 09/28/2014  . History of colitis 09/22/2014  . Hepatic steatosis 09/20/2014  . Endometriosis 08/30/2013    Past Surgical History  Procedure Laterality Date  . Back surgery    . Laparoscopic endometriosis fulguration  2015    [name]  . Colonoscopy  04/27/2006    Wohl-colitis, internal hemorrhoids  .  Esophagogastroduodenoscopy  02/2013    multiple peptic & duodenal ulcers, negative h pylori  . Colonoscopy  02/2013    focal right colitis, left colon biopsy negative, focal active proctitis without chronicity    Current Outpatient Rx  Name  Route  Sig  Dispense  Refill  . cyanocobalamin (,VITAMIN B-12,) 1000 MCG/ML injection   Intramuscular   Inject 1 mL into the muscle every 30 (thirty) days.         . diazepam (VALIUM) 5 MG tablet      Insert one tablet into the vagina at bedtime         . docusate sodium (COLACE) 100 MG capsule   Oral   Take 100 mg by mouth 2 (two) times daily.          . DULoxetine (CYMBALTA) 20 MG capsule   Oral   Take 1 capsule (20 mg total) by mouth daily.   30 capsule   2   . EPINEPHrine (EPIPEN 2-PAK) 0.3 mg/0.3 mL IJ SOAJ injection   Intramuscular   Inject into the muscle.         . fluticasone (FLONASE) 50 MCG/ACT nasal spray   Each Nare   Place 1 spray into both nostrils daily.         Marland Kitchen omeprazole (PRILOSEC) 20 MG capsule   Oral   Take 20 mg by mouth 2 (two) times daily before a meal.          . ondansetron (ZOFRAN) 8 MG tablet   Oral  Take 8 mg by mouth every 8 (eight) hours as needed for nausea or vomiting.         . promethazine (PHENERGAN) 12.5 MG tablet   Oral   Take 12.5 mg by mouth every 6 (six) hours as needed.          . topiramate (TOPAMAX) 50 MG tablet   Oral   Take 25 mg by mouth.          . traMADol (ULTRAM) 50 MG tablet   Oral   Take 100 mg by mouth every 6 (six) hours as needed. For pain.         . traZODone (DESYREL) 50 MG tablet   Oral   Take 1 tablet (50 mg total) by mouth at bedtime.   30 tablet   2     Allergies Peanut butter flavor; Codeine; Paroxetine; Paxil; Venlafaxine; and Zoloft  Family History  Problem Relation Age of Onset  . Breast cancer Mother 30  . COPD Father   . Lung cancer Father 59  . Dementia Father   . Diabetes Sister   . Diabetes Brother   . Ovarian  cancer Paternal Aunt   . Colon cancer Maternal Grandfather 46  . Liver disease Maternal Aunt     ?etiology  . Lymphoma Maternal Grandmother 60  . Lung cancer Other 50    maternal great grandfather  . Stomach cancer Other 78    maternal great grandmother    Social History Social History  Substance Use Topics  . Smoking status: Never Smoker   . Smokeless tobacco: Never Used  . Alcohol Use: No    Review of Systems  Constitutional: Negative for fever. Cardiovascular: Negative for chest pain. Respiratory: Negative for shortness of breath. Gastrointestinal:Right lower quadrant pain Genitourinary: Negative for dysuria. Musculoskeletal: Negative for back pain. Skin: Negative for rash. Neurological: Negative for headaches, focal weakness or numbness.  10-point ROS otherwise negative.  ____________________________________________   PHYSICAL EXAM:  VITAL SIGNS: ED Triage Vitals  Enc Vitals Group     BP 10/26/15 1945 117/69 mmHg     Pulse Rate 10/26/15 1945 87     Resp 10/26/15 1945 17     Temp 10/26/15 1945 97.6 F (36.4 C)     Temp Source 10/26/15 1945 Oral     SpO2 10/26/15 1945 100 %     Weight 10/26/15 1945 187 lb (84.823 kg)     Height 10/26/15 1945 5\' 5"  (1.651 m)     Head Cir --      Peak Flow --      Pain Score 10/26/15 1946 10   Constitutional: Alert and oriented. Well appearing and in no distress. Eyes: Conjunctivae are normal. PERRL. Normal extraocular movements. ENT   Head: Normocephalic and atraumatic.   Nose: No congestion/rhinnorhea.   Mouth/Throat: Mucous membranes are moist.   Neck: No stridor. Hematological/Lymphatic/Immunilogical: No cervical lymphadenopathy. Cardiovascular: Normal rate, regular rhythm.  No murmurs, rubs, or gallops. Respiratory: Normal respiratory effort without tachypnea nor retractions. Breath sounds are clear and equal bilaterally. No wheezes/rales/rhonchi. Gastrointestinal: Soft and tender in the right lower  quadrant. No distention. No rebound or guarding. Genitourinary: No external lesions. No blood in vaginal vault. No CMT. Tenderness to palpation of the right adnexal. No left adnexal tenderness or fullness. Musculoskeletal: Normal range of motion in all extremities. No joint effusions.  No lower extremity tenderness nor edema. Neurologic:  Normal speech and language. No gross focal neurologic deficits are appreciated.  Skin:  Skin  is warm, dry and intact. No rash noted. Psychiatric: Mood and affect are normal. Speech and behavior are normal. Patient exhibits appropriate insight and judgment.  ____________________________________________    LABS (pertinent positives/negatives)  Labs Reviewed  COMPREHENSIVE METABOLIC PANEL - Abnormal; Notable for the following:    Glucose, Bld 101 (*)    Calcium 8.8 (*)    Alkaline Phosphatase 128 (*)    All other components within normal limits  CBC - Abnormal; Notable for the following:    RDW 14.8 (*)    All other components within normal limits  URINALYSIS COMPLETEWITH MICROSCOPIC ([facility] ONLY) - Abnormal; Notable for the following:    Color, Urine YELLOW (*)    APPearance CLOUDY (*)    Specific Gravity, Urine 1.031 (*)    Protein, ur 100 (*)    Leukocytes, UA 2+ (*)    Squamous Epithelial / LPF 6-30 (*)    All other components within normal limits  LIPASE, BLOOD  POC URINE PREG, ED  POCT PREGNANCY, URINE  TYPE AND SCREEN     ____________________________________________   EKG  None  ____________________________________________    RADIOLOGY  CT abd/pel IMPRESSION: 1. Increased size of the dominant uterine fibroid. 2. Small amount of pelvic free fluid, likely physiologic. 3. Normal appendix.   ____________________________________________   PROCEDURES  Procedure(s) performed: None  Critical Care performed: No  ____________________________________________   INITIAL IMPRESSION / ASSESSMENT AND PLAN / ED  COURSE  Pertinent labs & imaging results that were available during my care of the patient were reviewed by me and considered in my medical decision making (see chart for details).  She presented to the emergency department today because of concerns for right lower quadrant pain. CT scan was obtained given concern for appendicitis which was negative. Pelvic exam was then performed which showed some adnexal tenderness. Will get an ultrasound. Ultrasound pending at time of sign out. Her partner did come back positive for clue cells so will prepare antibiotics for bacterial vaginosis.  ____________________________________________   FINAL CLINICAL IMPRESSION(S) / ED DIAGNOSES  Final diagnoses:  Left adnexal tenderness  Bacterial vaginosis     Note: This dictation was prepared with Dragon dictation. Any transcriptional errors that result from this process are unintentional    Nance Pear, MD 10/27/15 0002

## 2015-10-26 NOTE — ED Notes (Signed)
Pt arrived via EMS from home c/o RLQ pain and vomiting up blood. Symptoms started yesterday. Pt reports the pain in the RLQ feels like something is going to burst, pt reports that it feels better to hold a pillow to the RLQ, pt very tender in RLQ and is gaurding. Pt reports the blood she is vomiting up is bright red. Denies any blood in her stool. Last BM was yesterday and was normal.

## 2015-10-26 NOTE — Discharge Instructions (Signed)
Please seek medical attention for any high fevers, chest pain, shortness of breath, change in behavior, persistent vomiting, bloody stool or any other new or concerning symptoms.   Bacterial Vaginosis Bacterial vaginosis is a vaginal infection that occurs when the normal balance of bacteria in the vagina is disrupted. It results from an overgrowth of certain bacteria. This is the most common vaginal infection in women of childbearing age. Treatment is important to prevent complications, especially in pregnant women, as it can cause a premature delivery. CAUSES  Bacterial vaginosis is caused by an increase in harmful bacteria that are normally present in smaller amounts in the vagina. Several different kinds of bacteria can cause bacterial vaginosis. However, the reason that the condition develops is not fully understood. RISK FACTORS Certain activities or behaviors can put you at an increased risk of developing bacterial vaginosis, including:  Having a new sex partner or multiple sex partners.  Douching.  Using an intrauterine device (IUD) for contraception. Women do not get bacterial vaginosis from toilet seats, bedding, swimming pools, or contact with objects around them. SIGNS AND SYMPTOMS  Some women with bacterial vaginosis have no signs or symptoms. Common symptoms include:  Grey vaginal discharge.  A fishlike odor with discharge, especially after sexual intercourse.  Itching or burning of the vagina and vulva.  Burning or pain with urination. DIAGNOSIS  Your health care provider will take a medical history and examine the vagina for signs of bacterial vaginosis. A sample of vaginal fluid may be taken. Your health care provider will look at this sample under a microscope to check for bacteria and abnormal cells. A vaginal pH test may also be done.  TREATMENT  Bacterial vaginosis may be treated with antibiotic medicines. These may be given in the form of a pill or a vaginal cream. A  second round of antibiotics may be prescribed if the condition comes back after treatment. Because bacterial vaginosis increases your risk for sexually transmitted diseases, getting treated can help reduce your risk for chlamydia, gonorrhea, HIV, and herpes. HOME CARE INSTRUCTIONS   Only take over-the-counter or prescription medicines as directed by your health care provider.  If antibiotic medicine was prescribed, take it as directed. Make sure you finish it even if you start to feel better.  Tell all sexual partners that you have a vaginal infection. They should see their health care provider and be treated if they have problems, such as a mild rash or itching.  During treatment, it is important that you follow these instructions:  Avoid sexual activity or use condoms correctly.  Do not douche.  Avoid alcohol as directed by your health care provider.  Avoid breastfeeding as directed by your health care provider. SEEK MEDICAL CARE IF:   Your symptoms are not improving after 3 days of treatment.  You have increased discharge or pain.  You have a fever. MAKE SURE YOU:   Understand these instructions.  Will watch your condition.  Will get help right away if you are not doing well or get worse. FOR MORE INFORMATION  Centers for Disease Control and Prevention, Division of STD Prevention: [employer] American Sexual Health Association (ASHA): www.ashastd.org    This information is not intended to replace advice given to you by your health care provider. Make sure you discuss any questions you have with your health care provider.   Document Released: 03/23/2005 Document Revised: 04/13/2014 Document Reviewed: 11/02/2012 Elsevier Interactive Patient Education [employer].

## 2015-10-27 LAB — CHLAMYDIA/NGC RT PCR (ARMC ONLY)
Chlamydia Tr: NOT DETECTED
N gonorrhoeae: NOT DETECTED

## 2015-10-28 LAB — URINE CULTURE

## 2015-10-31 DIAGNOSIS — F4310 Post-traumatic stress disorder, unspecified: Secondary | ICD-10-CM | POA: Diagnosis not present

## 2015-10-31 DIAGNOSIS — R112 Nausea with vomiting, unspecified: Secondary | ICD-10-CM | POA: Diagnosis not present

## 2015-10-31 DIAGNOSIS — N920 Excessive and frequent menstruation with regular cycle: Secondary | ICD-10-CM | POA: Diagnosis not present

## 2015-10-31 DIAGNOSIS — D519 Vitamin B12 deficiency anemia, unspecified: Secondary | ICD-10-CM | POA: Diagnosis not present

## 2015-10-31 DIAGNOSIS — F5101 Primary insomnia: Secondary | ICD-10-CM | POA: Diagnosis not present

## 2015-11-19 DIAGNOSIS — D259 Leiomyoma of uterus, unspecified: Secondary | ICD-10-CM | POA: Diagnosis not present

## 2015-11-19 DIAGNOSIS — N920 Excessive and frequent menstruation with regular cycle: Secondary | ICD-10-CM | POA: Diagnosis not present

## 2015-11-19 DIAGNOSIS — N809 Endometriosis, unspecified: Secondary | ICD-10-CM | POA: Diagnosis not present

## 2015-12-03 DIAGNOSIS — N949 Unspecified condition associated with female genital organs and menstrual cycle: Secondary | ICD-10-CM | POA: Diagnosis not present

## 2015-12-03 DIAGNOSIS — D259 Leiomyoma of uterus, unspecified: Secondary | ICD-10-CM | POA: Diagnosis not present

## 2015-12-03 DIAGNOSIS — N920 Excessive and frequent menstruation with regular cycle: Secondary | ICD-10-CM | POA: Diagnosis not present

## 2015-12-05 DIAGNOSIS — N920 Excessive and frequent menstruation with regular cycle: Secondary | ICD-10-CM | POA: Diagnosis not present

## 2015-12-05 DIAGNOSIS — F331 Major depressive disorder, recurrent, moderate: Secondary | ICD-10-CM | POA: Diagnosis not present

## 2015-12-05 DIAGNOSIS — D519 Vitamin B12 deficiency anemia, unspecified: Secondary | ICD-10-CM | POA: Diagnosis not present

## 2015-12-05 DIAGNOSIS — F4310 Post-traumatic stress disorder, unspecified: Secondary | ICD-10-CM | POA: Diagnosis not present

## 2015-12-05 DIAGNOSIS — F4323 Adjustment disorder with mixed anxiety and depressed mood: Secondary | ICD-10-CM | POA: Diagnosis not present

## 2015-12-05 DIAGNOSIS — E039 Hypothyroidism, unspecified: Secondary | ICD-10-CM | POA: Diagnosis not present

## 2015-12-12 ENCOUNTER — Ambulatory Visit: Payer: Self-pay | Admitting: Gastroenterology

## 2016-01-06 ENCOUNTER — Other Ambulatory Visit: Payer: Self-pay

## 2016-01-07 ENCOUNTER — Ambulatory Visit (INDEPENDENT_AMBULATORY_CARE_PROVIDER_SITE_OTHER): Payer: Medicare Other | Admitting: Gastroenterology

## 2016-01-07 ENCOUNTER — Encounter: Payer: Self-pay | Admitting: Gastroenterology

## 2016-01-07 VITALS — BP 101/69 | HR 64 | Temp 98.2°F | Ht 65.0 in | Wt 173.4 lb

## 2016-01-07 DIAGNOSIS — K920 Hematemesis: Secondary | ICD-10-CM

## 2016-01-07 NOTE — Progress Notes (Signed)
Primary Care Physician: Lavera Guise, MD  Primary Gastroenterologist:  Dr. Lucilla Lame  Chief Complaint  Patient presents with  . Follow up ER    July 2017   . Hematemesis    HPI: Teresa Crawford is a 30 y.o. female here For follow-up after being seen in the emergency department in July for hematemesis. The patient states that she went to emergency room because she had vomited blood. The patient lives in a group home and the report was that this vomiting up of blood was unwitnessed. The patient denies any unexplained weight loss black stools or bloody stools. The patient also reports that she has abdominal pain but from non-GI causes because she has a history of fibroids. The patient is concerned because she had a grandfather with colon cancer but the patient is not having any lower GI symptoms. She is now here for follow-up after being told to follow with her GI upon discharge.  Current Outpatient Prescriptions  Medication Sig Dispense Refill  . cetirizine (ZYRTEC) 10 MG tablet Take 10 mg by mouth daily.    . cyanocobalamin (,VITAMIN B-12,) 1000 MCG/ML injection Inject 1 mL into the muscle every 30 (thirty) days.    . diazepam (VALIUM) 5 MG tablet Insert one tablet into the vagina at bedtime    . docusate sodium (COLACE) 100 MG capsule Take 100 mg by mouth 2 (two) times daily.     . DULoxetine (CYMBALTA) 20 MG capsule Take 1 capsule (20 mg total) by mouth daily. 30 capsule 2  . EPINEPHrine (EPIPEN 2-PAK) 0.3 mg/0.3 mL IJ SOAJ injection Inject into the muscle.    . fluticasone (FLONASE) 50 MCG/ACT nasal spray Place 1 spray into both nostrils daily.    Marland Kitchen omeprazole (PRILOSEC) 20 MG capsule Take 20 mg by mouth 2 (two) times daily before a meal.     . ondansetron (ZOFRAN) 8 MG tablet Take 8 mg by mouth every 8 (eight) hours as needed for nausea or vomiting.    . topiramate (TOPAMAX) 50 MG tablet Take 25 mg by mouth.     . traMADol (ULTRAM) 50 MG tablet Take 100 mg by mouth every 6 (six)  hours as needed. For pain.    . traZODone (DESYREL) 50 MG tablet Take 1 tablet (50 mg total) by mouth at bedtime. 30 tablet 2  . Butalbital-APAP-Caffeine 50-300-40 MG CAPS Take by mouth.    . Cholecalciferol (VITAMIN D3) 1000 units CAPS Take by mouth.    . hydrOXYzine (VISTARIL) 50 MG capsule Take 50 mg by mouth.    . promethazine (PHENERGAN) 12.5 MG tablet Take 12.5 mg by mouth every 6 (six) hours as needed.     . SUMAtriptan (IMITREX) 50 MG tablet Take by mouth.     No current facility-administered medications for this visit.     Allergies as of 01/07/2016 - Review Complete 01/07/2016  Allergen Reaction Noted  . Peanut butter flavor Swelling 08/24/2014  . Codeine Nausea And Vomiting 12/10/2011  . Paroxetine  09/28/2014  . Paxil [paroxetine hcl] Other (See Comments) 12/10/2011  . Venlafaxine  09/28/2014  . Zoloft [sertraline hcl] Other (See Comments) 12/10/2011    ROS:  General: Negative for anorexia, weight loss, fever, chills, fatigue, weakness. ENT: Negative for hoarseness, difficulty swallowing , nasal congestion. CV: Negative for chest pain, angina, palpitations, dyspnea on exertion, peripheral edema.  Respiratory: Negative for dyspnea at rest, dyspnea on exertion, cough, sputum, wheezing.  GI: See history of present illness. GU:  Negative  for dysuria, hematuria, urinary incontinence, urinary frequency, nocturnal urination.  Endo: Negative for unusual weight change.    Physical Examination:   BP 101/69   Pulse 64   Temp 98.2 F (36.8 C) (Oral)   Ht 5\' 5"  (1.651 m)   Wt 173 lb 6.4 oz (78.7 kg)   BMI 28.86 kg/m   General: Well-nourished, well-developed in no acute distress.  Eyes: No icterus. Conjunctivae pink. Mouth: Oropharyngeal mucosa moist and pink , no lesions erythema or exudate. Lungs: Clear to auscultation bilaterally. Non-labored. Heart: Regular rate and rhythm, no murmurs rubs or gallops.  Abdomen: Bowel sounds are normal, nontender, nondistended, no  hepatosplenomegaly or masses, no abdominal bruits or hernia , no rebound or guarding.   Extremities: No lower extremity edema. No clubbing or deformities. Neuro: Alert and oriented x 3.  Grossly intact. Skin: Warm and dry, no jaundice.   Psych: Alert and cooperative, normal mood and affect.  Labs:    Imaging Studies: No results found.  Assessment and Plan:   Teresa Crawford is a 30 y.o. y/o female who comes in today with a history of hematemesis. The patient has had no further episodes of this. The patient does report some early satiety but the patient's caregiver states it is more of a problem with appetite. There is no worrisome symptoms such as weight loss or further hematemesis. The patient is ready on omeprazole 20 mg a day. The patient will be kept on this dose. She'll call me if there is any further problems of her hematemesis returns.   Note: This dictation was prepared with Dragon dictation along with smaller phrase technology. Any transcriptional errors that result from this process are unintentional.

## 2016-01-10 DIAGNOSIS — Z1231 Encounter for screening mammogram for malignant neoplasm of breast: Secondary | ICD-10-CM | POA: Diagnosis not present

## 2016-01-10 DIAGNOSIS — Z803 Family history of malignant neoplasm of breast: Secondary | ICD-10-CM | POA: Diagnosis not present

## 2016-01-20 ENCOUNTER — Ambulatory Visit: Payer: Self-pay | Admitting: Psychiatry

## 2016-01-21 ENCOUNTER — Encounter: Payer: Self-pay | Admitting: Psychiatry

## 2016-01-21 ENCOUNTER — Ambulatory Visit (INDEPENDENT_AMBULATORY_CARE_PROVIDER_SITE_OTHER): Payer: Medicare Other | Admitting: Psychiatry

## 2016-01-21 VITALS — BP 101/66 | HR 65 | Temp 97.6°F | Wt 175.8 lb

## 2016-01-21 DIAGNOSIS — F331 Major depressive disorder, recurrent, moderate: Secondary | ICD-10-CM

## 2016-01-21 DIAGNOSIS — F4310 Post-traumatic stress disorder, unspecified: Secondary | ICD-10-CM | POA: Diagnosis not present

## 2016-01-21 DIAGNOSIS — F79 Unspecified intellectual disabilities: Secondary | ICD-10-CM | POA: Diagnosis not present

## 2016-01-21 MED ORDER — DULOXETINE HCL 20 MG PO CPEP: 20.0000 mg | ORAL_CAPSULE | Freq: Every day | ORAL | 3 refills | Status: DC

## 2016-01-21 MED ORDER — TRAZODONE HCL 50 MG PO TABS: 50.0000 mg | ORAL_TABLET | Freq: Every day | ORAL | 3 refills | Status: DC

## 2016-01-21 NOTE — Progress Notes (Signed)
Patient ID: Teresa Crawford, female   DOB: 1986/02/10, 30 y.o.   MRN: [id number]  [facility] MD/PA/NP OP Progress Note  01/21/2016 10:42 AM SAMUELA TINGLE  MRN:  [id number]  Subjective:  Follow-up of her posttraumatic stress disorder and major depressive disorder, recurrent moderate. Reports doing well. Patient states that she is not working anymore at [employer]. States that it was too fast paced for her. 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 Overall mood is good and states her appetite is up and down. Her case manager states that patient has been doing quite well overall.  Chief Complaint: Doing well Chief Complaint    Follow-up; Medication Refill     Visit Diagnosis:     ICD-9-CM ICD-10-CM   1. Major depressive disorder, recurrent episode, moderate (HCC) 296.32 F33.1 traZODone (DESYREL) 50 MG tablet  2. PTSD (post-traumatic stress disorder) 309.81 F43.10   3. Intellectual disability 319 F79   4. Posttraumatic stress disorder 309.81 F43.10 traZODone (DESYREL) 50 MG tablet    Past Medical History:  Past Medical History:  Diagnosis Date  . Chronic back pain   . Chronic fatigue syndrome   . Depression   . Elevated liver enzymes   . Endometriosis   . Hepatic steatosis   . Hypothyroid   . Last menstrual period (LMP) > 10 days ago 09/12/15  . Mental retardation   . Migraine   . Posttraumatic stress disorder   . PUD (peptic ulcer disease)   . Scoliosis   . Splenomegaly   . Uterine fibroid     Past Surgical History:  Procedure Laterality Date  . BACK SURGERY    . COLONOSCOPY  04/27/2006   Wohl-colitis, internal hemorrhoids  . COLONOSCOPY  02/2013   focal right colitis, left colon biopsy negative, focal active proctitis without chronicity  . ESOPHAGOGASTRODUODENOSCOPY  02/2013   multiple peptic & duodenal ulcers, negative h pylori  . LAPAROSCOPIC ENDOMETRIOSIS FULGURATION  2015   [name]   Family History:  Family History  Problem Relation Age of  Onset  . Breast cancer Mother 69  . COPD Father   . Lung cancer Father 7  . Dementia Father   . Diabetes Sister   . Diabetes Brother   . Ovarian cancer Paternal Aunt   . Colon cancer Maternal Grandfather 46  . Liver disease Maternal Aunt     ?etiology  . Lymphoma Maternal Grandmother 60  . Lung cancer Other 43    maternal great grandfather  . Stomach cancer Other 74    maternal great grandmother   Social History:  Social History   Social History  . Marital status: Single    Spouse name: N/A  . Number of children: 0  . Years of education: N/A   Social History Main Topics  . Smoking status: Never Smoker  . Smokeless tobacco: Never Used  . Alcohol use No  . Drug use: No  . Sexual activity: No   Other Topics Concern  . None   Social History Narrative   She has brother, half brother & sister live nearby   Both parents deceased   Care managed by [name]   Additional History:   Assessment:   Musculoskeletal: Strength & Muscle Tone: within normal limits Gait & Station: normal Patient leans: N/A  Psychiatric Specialty Exam: Depression         Associated symptoms include does not have insomnia and no suicidal ideas. Medication Refill     Review  of Systems  Endo/Heme/Allergies:       Patient reports that have an ultrasound of her thyroid.  Psychiatric/Behavioral: Negative for depression, hallucinations, memory loss, substance abuse and suicidal ideas. The patient is not nervous/anxious and does not have insomnia.   All other systems reviewed and are negative.   Blood pressure 101/66, pulse 65, temperature 97.6 F (36.4 C), temperature source Oral, weight 175 lb 12.8 oz (79.7 kg), last menstrual period 01/05/2016.Body mass index is 29.25 kg/m.  General Appearance: Fairly Groomed  Eye Contact:  Fair  Speech:  Clear and Coherent and Normal Rate  Volume:  Normal  Mood:  Good  Affect:  Congruent smiling and able to laugh   Thought Process:  Concrete   Orientation:  Full (Time, Place, and Person)  Thought Content:  Negative  Suicidal Thoughts:  No  Homicidal Thoughts:  No  Memory:  Immediate;   Good Recent;   Good Remote;   Good  Judgement:  Fair  Insight:  Fair  Psychomotor Activity:  Negative  Concentration:  Good  Recall:  Good  Fund of Knowledge: Fair  Language: Fair  Akathisia:  Negative  Handed:  Right unknown  AIMS (if indicated):  Not done  Assets:  Social Support  ADL's:  Intact  Cognition: Impaired,  Moderate  Sleep:  Good    Is the patient at risk to self?  No. Has the patient been a risk to self in the past 6 months?  No. Has the patient been a risk to self within the distant past?  No. Is the patient a risk to others?  No. Has the patient been a risk to others in the past 6 months?  No. Has the patient been a risk to others within the distant past?  No.  Current Medications: Current Outpatient Prescriptions  Medication Sig Dispense Refill  . Butalbital-APAP-Caffeine 50-300-40 MG CAPS Take by mouth.    . cetirizine (ZYRTEC) 10 MG tablet Take 10 mg by mouth daily.    . Cholecalciferol (VITAMIN D3) 1000 units CAPS Take by mouth.    . cyanocobalamin (,VITAMIN B-12,) 1000 MCG/ML injection Inject 1 mL into the muscle every 30 (thirty) days.    . diazepam (VALIUM) 5 MG tablet Insert one tablet into the vagina at bedtime    . docusate sodium (COLACE) 100 MG capsule Take 100 mg by mouth 2 (two) times daily.     . DULoxetine (CYMBALTA) 20 MG capsule Take 1 capsule (20 mg total) by mouth daily. 30 capsule 3  . EPINEPHrine (EPIPEN 2-PAK) 0.3 mg/0.3 mL IJ SOAJ injection Inject into the muscle.    . fluticasone (FLONASE) 50 MCG/ACT nasal spray Place 1 spray into both nostrils daily.    . hydrOXYzine (VISTARIL) 50 MG capsule Take 50 mg by mouth.    Marland Kitchen omeprazole (PRILOSEC) 20 MG capsule Take 20 mg by mouth 2 (two) times daily before a meal.     . ondansetron (ZOFRAN) 8 MG tablet Take 8 mg by mouth every 8 (eight) hours as  needed for nausea or vomiting.    . promethazine (PHENERGAN) 12.5 MG tablet Take 12.5 mg by mouth every 6 (six) hours as needed.     . SUMAtriptan (IMITREX) 50 MG tablet Take by mouth.    . topiramate (TOPAMAX) 50 MG tablet Take 25 mg by mouth.     . traMADol (ULTRAM) 50 MG tablet Take 100 mg by mouth every 6 (six) hours as needed. For pain.    . traZODone (DESYREL)  50 MG tablet Take 1 tablet (50 mg total) by mouth at bedtime. 30 tablet 3   No current facility-administered medications for this visit.     Medical Decision Making:  Established Problem, Stable/Improving (1)  Treatment Plan Summary:Medication management   Post traumatic stress disorder- Continue  Cymbalta 20 mg daily   Major depressive disorder, recurrent, moderate- Same as above  Insomnia- Continue to take Trazodone at 50mg  po qhs.  Patient is also on Valium prescribed by another doctor for pelvic muscle spasms.  Patient will return in  3 months. She has  been encouraged call with any questions or concerns prior to her next appointment.  Virginie Josten 01/21/2016, 10:42 AM

## 2016-02-04 DIAGNOSIS — Z23 Encounter for immunization: Secondary | ICD-10-CM | POA: Diagnosis not present

## 2016-02-04 DIAGNOSIS — F4310 Post-traumatic stress disorder, unspecified: Secondary | ICD-10-CM | POA: Diagnosis not present

## 2016-02-04 DIAGNOSIS — D252 Subserosal leiomyoma of uterus: Secondary | ICD-10-CM | POA: Diagnosis not present

## 2016-02-04 DIAGNOSIS — E049 Nontoxic goiter, unspecified: Secondary | ICD-10-CM | POA: Diagnosis not present

## 2016-02-04 DIAGNOSIS — D251 Intramural leiomyoma of uterus: Secondary | ICD-10-CM | POA: Diagnosis not present

## 2016-02-04 DIAGNOSIS — E041 Nontoxic single thyroid nodule: Secondary | ICD-10-CM | POA: Diagnosis not present

## 2016-02-04 DIAGNOSIS — D519 Vitamin B12 deficiency anemia, unspecified: Secondary | ICD-10-CM | POA: Diagnosis not present

## 2016-02-04 DIAGNOSIS — N920 Excessive and frequent menstruation with regular cycle: Secondary | ICD-10-CM | POA: Diagnosis not present

## 2016-02-04 DIAGNOSIS — N946 Dysmenorrhea, unspecified: Secondary | ICD-10-CM | POA: Diagnosis not present

## 2016-02-04 DIAGNOSIS — F331 Major depressive disorder, recurrent, moderate: Secondary | ICD-10-CM | POA: Diagnosis not present

## 2016-02-05 ENCOUNTER — Ambulatory Visit: Payer: Medicare Other | Admitting: Licensed Clinical Social Worker

## 2016-02-25 DIAGNOSIS — D259 Leiomyoma of uterus, unspecified: Secondary | ICD-10-CM | POA: Diagnosis not present

## 2016-03-03 DIAGNOSIS — D252 Subserosal leiomyoma of uterus: Secondary | ICD-10-CM | POA: Diagnosis not present

## 2016-04-07 ENCOUNTER — Encounter: Payer: Self-pay | Admitting: Emergency Medicine

## 2016-04-07 ENCOUNTER — Emergency Department
Admission: EM | Admit: 2016-04-07 | Discharge: 2016-04-07 | Disposition: A | Discharge status: Home / Self Care | Payer: Medicare Other | Attending: Emergency Medicine | Admitting: Emergency Medicine

## 2016-04-07 ENCOUNTER — Telehealth: Payer: Self-pay

## 2016-04-07 DIAGNOSIS — Z046 Encounter for general psychiatric examination, requested by authority: Secondary | ICD-10-CM | POA: Diagnosis present

## 2016-04-07 DIAGNOSIS — F331 Major depressive disorder, recurrent, moderate: Secondary | ICD-10-CM | POA: Diagnosis present

## 2016-04-07 DIAGNOSIS — E039 Hypothyroidism, unspecified: Secondary | ICD-10-CM | POA: Diagnosis not present

## 2016-04-07 DIAGNOSIS — F4310 Post-traumatic stress disorder, unspecified: Secondary | ICD-10-CM | POA: Diagnosis present

## 2016-04-07 DIAGNOSIS — Z79899 Other long term (current) drug therapy: Secondary | ICD-10-CM | POA: Diagnosis not present

## 2016-04-07 DIAGNOSIS — F339 Major depressive disorder, recurrent, unspecified: Secondary | ICD-10-CM | POA: Diagnosis present

## 2016-04-07 DIAGNOSIS — S61519A Laceration without foreign body of unspecified wrist, initial encounter: Secondary | ICD-10-CM

## 2016-04-07 DIAGNOSIS — F329 Major depressive disorder, single episode, unspecified: Secondary | ICD-10-CM | POA: Diagnosis not present

## 2016-04-07 DIAGNOSIS — X789XXA Intentional self-harm by unspecified sharp object, initial encounter: Secondary | ICD-10-CM

## 2016-04-07 DIAGNOSIS — F32A Depression, unspecified: Secondary | ICD-10-CM

## 2016-04-07 LAB — COMPREHENSIVE METABOLIC PANEL
ALK PHOS: 97 U/L (ref 38–126)
ALT: 17 U/L (ref 14–54)
AST: 26 U/L (ref 15–41)
Albumin: 3.5 g/dL (ref 3.5–5.0)
Anion gap: 8 (ref 5–15)
BILIRUBIN TOTAL: 0.4 mg/dL (ref 0.3–1.2)
BUN: 9 mg/dL (ref 6–20)
CALCIUM: 8.9 mg/dL (ref 8.9–10.3)
CHLORIDE: 106 mmol/L (ref 101–111)
CO2: 23 mmol/L (ref 22–32)
CREATININE: 0.76 mg/dL (ref 0.44–1.00)
Glucose, Bld: 85 mg/dL (ref 65–99)
Potassium: 3.6 mmol/L (ref 3.5–5.1)
Sodium: 137 mmol/L (ref 135–145)
TOTAL PROTEIN: 7.9 g/dL (ref 6.5–8.1)

## 2016-04-07 LAB — CBC
HCT: 40.2 % (ref 35.0–47.0)
Hemoglobin: 13.3 g/dL (ref 12.0–16.0)
MCH: 26.4 pg (ref 26.0–34.0)
MCHC: 33.1 g/dL (ref 32.0–36.0)
MCV: 79.8 fL — AB (ref 80.0–100.0)
PLATELETS: 282 10*3/uL (ref 150–440)
RBC: 5.04 MIL/uL (ref 3.80–5.20)
RDW: 16 % — AB (ref 11.5–14.5)
WBC: 7 10*3/uL (ref 3.6–11.0)

## 2016-04-07 LAB — URINE DRUG SCREEN, QUALITATIVE (ARMC ONLY)
Amphetamines, Ur Screen: NOT DETECTED
BARBITURATES, UR SCREEN: NOT DETECTED
Benzodiazepine, Ur Scrn: POSITIVE — AB
CANNABINOID 50 NG, UR ~~LOC~~: NOT DETECTED
Cocaine Metabolite,Ur ~~LOC~~: NOT DETECTED
MDMA (ECSTASY) UR SCREEN: NOT DETECTED
Methadone Scn, Ur: NOT DETECTED
Opiate, Ur Screen: NOT DETECTED
PHENCYCLIDINE (PCP) UR S: NOT DETECTED
TRICYCLIC, UR SCREEN: NOT DETECTED

## 2016-04-07 LAB — SALICYLATE LEVEL

## 2016-04-07 LAB — ACETAMINOPHEN LEVEL: Acetaminophen (Tylenol), Serum: <10 ug/mL — ABNORMAL LOW (ref 10–30)

## 2016-04-07 LAB — ETHANOL

## 2016-04-07 LAB — POCT PREGNANCY, URINE: PREG TEST UR: NEGATIVE

## 2016-04-07 MED ORDER — DULOXETINE HCL 20 MG PO CPEP: 20.0000 mg | ORAL_CAPSULE | Freq: Two times a day (BID) | ORAL | 1 refills | Status: DC

## 2016-04-07 MED ORDER — ONDANSETRON HCL 4 MG PO TABS: 4.0000 mg | ORAL_TABLET | Freq: Three times a day (TID) | ORAL | 0 refills | Status: DC | PRN

## 2016-04-07 NOTE — ED Notes (Signed)
Pt discharged to home.  Caregiver driving.  Discharge instructions reviewed with pt and caregiver.  Verbalized understanding.  No questions or concerns at this time.  Teach back verified.  Pt in NAD.  No items left in ED.

## 2016-04-07 NOTE — ED Provider Notes (Signed)
[facility] Emergency Department Provider Note   ____________________________________________   I have reviewed the triage vital signs and the nursing notes.   HISTORY  Chief Complaint Mental Health Problem   History limited by: Not Limited   HPI Teresa Crawford is a 31 y.o. female who presents to the emergency department today because of concern for self harm and depression. Patient states that part of her depression is associated with the fact that she will require a hysterectomy. The patient did cut herself on her left forearm a few days ago. Cuts are superficial. No SI at this time. In addition the patient does state that she has been feeling nauseous and has had vomiting and diarrhea recently.    Past Medical History:  Diagnosis Date  . Chronic back pain   . Chronic fatigue syndrome   . Depression   . Elevated liver enzymes   . Endometriosis   . Hepatic steatosis   . Hypothyroid   . Last menstrual period (LMP) > 10 days ago 09/12/15  . Mental retardation   . Migraine   . Posttraumatic stress disorder   . PUD (peptic ulcer disease)   . Scoliosis   . Splenomegaly   . Uterine fibroid     Patient Active Problem List   Diagnosis Date Noted  . Major depressive disorder, recurrent episode, moderate ([place]) 10/29/2014  . PTSD (post-traumatic stress disorder) 10/29/2014  . Grief 10/29/2014  . CFIDS (chronic fatigue and immune dysfunction syndrome) ([place]) 09/28/2014  . Aggrieved 09/28/2014  . Depression, major, recurrent ([place]) 09/28/2014  . Intellectual disability 09/28/2014  . Neurosis, posttraumatic 09/28/2014  . History of colitis 09/22/2014  . Hepatic steatosis 09/20/2014  . Endometriosis 08/30/2013    Past Surgical History:  Procedure Laterality Date  . BACK SURGERY    . COLONOSCOPY  04/27/2006   Wohl-colitis, internal hemorrhoids  . COLONOSCOPY  02/2013   focal right colitis, left colon biopsy negative, focal active proctitis without  chronicity  . ESOPHAGOGASTRODUODENOSCOPY  02/2013   multiple peptic & duodenal ulcers, negative h pylori  . LAPAROSCOPIC ENDOMETRIOSIS FULGURATION  2015   [name]    Prior to Admission medications   Medication Sig Start Date End Date Taking? Authorizing Provider  Butalbital-APAP-Caffeine 50-300-40 MG CAPS Take by mouth.    Historical Provider, MD  cetirizine (ZYRTEC) 10 MG tablet Take 10 mg by mouth daily.    Historical Provider, MD  Cholecalciferol (VITAMIN D3) 1000 units CAPS Take by mouth.    Historical Provider, MD  cyanocobalamin (,VITAMIN B-12,) 1000 MCG/ML injection Inject 1 mL into the muscle every 30 (thirty) days.    Historical Provider, MD  diazepam (VALIUM) 5 MG tablet Insert one tablet into the vagina at bedtime 11/26/14   Historical Provider, MD  docusate sodium (COLACE) 100 MG capsule Take 100 mg by mouth 2 (two) times daily.     Historical Provider, MD  DULoxetine (CYMBALTA) 20 MG capsule Take 1 capsule (20 mg total) by mouth 2 (two) times daily. 04/07/16   Gonzella Lex, MD  EPINEPHrine (EPIPEN 2-PAK) 0.3 mg/0.3 mL IJ SOAJ injection Inject into the muscle.    Historical Provider, MD  fluticasone (FLONASE) 50 MCG/ACT nasal spray Place 1 spray into both nostrils daily.    Historical Provider, MD  hydrOXYzine (VISTARIL) 50 MG capsule Take 50 mg by mouth. 06/29/14   Historical Provider, MD  omeprazole (PRILOSEC) 20 MG capsule Take 20 mg by mouth 2 (two) times daily before a meal.  Historical Provider, MD  ondansetron (ZOFRAN) 8 MG tablet Take 8 mg by mouth every 8 (eight) hours as needed for nausea or vomiting.    Historical Provider, MD  promethazine (PHENERGAN) 12.5 MG tablet Take 12.5 mg by mouth every 6 (six) hours as needed.     Historical Provider, MD  SUMAtriptan (IMITREX) 50 MG tablet Take by mouth.    Historical Provider, MD  topiramate (TOPAMAX) 50 MG tablet Take 25 mg by mouth.     Historical Provider, MD  traMADol (ULTRAM) 50 MG tablet Take 100 mg by mouth every 6 (six)  hours as needed. For pain.    Historical Provider, MD  traZODone (DESYREL) 50 MG tablet Take 1 tablet (50 mg total) by mouth at bedtime. 01/21/16   Himabindu Ravi, MD    Allergies Peanut butter flavor; Codeine; Paroxetine; Paxil [paroxetine hcl]; Venlafaxine; and Zoloft [sertraline hcl]  Family History  Problem Relation Age of Onset  . Breast cancer Mother 19  . COPD Father   . Lung cancer Father 74  . Dementia Father   . Diabetes Sister   . Diabetes Brother   . Ovarian cancer Paternal Aunt   . Colon cancer Maternal Grandfather 87  . Liver disease Maternal Aunt     ?etiology  . Lymphoma Maternal Grandmother 60  . Lung cancer Other 26    maternal great grandfather  . Stomach cancer Other 26    maternal great grandmother    Social History Social History  Substance Use Topics  . Smoking status: Never Smoker  . Smokeless tobacco: Never Used  . Alcohol use No    Review of Systems  Constitutional: Negative for fever. Cardiovascular: Negative for chest pain. Respiratory: Negative for shortness of breath. Gastrointestinal: Negative for abdominal pain. Positive for vomiting, diarrhea and nausea.  Genitourinary: Negative for dysuria. Musculoskeletal: Negative for back pain. Skin: Negative for rash. Neurological: Negative for headaches, focal weakness or numbness.  10-point ROS otherwise negative.  ____________________________________________   PHYSICAL EXAM:  VITAL SIGNS: ED Triage Vitals  Enc Vitals Group     BP 04/07/16 1743 118/73     Pulse Rate 04/07/16 1743 84     Resp 04/07/16 1743 17     Temp 04/07/16 1743 98.5 F (36.9 C)     Temp Source 04/07/16 1743 Oral     SpO2 04/07/16 1743 98 %     Weight 04/07/16 1744 170 lb (77.1 kg)     Height 04/07/16 1744 5\' 3"  (1.6 m)     Head Circumference --      Peak Flow --      Pain Score --      Pain Loc --      Pain Edu? --      Excl. in [place]? --      Constitutional: Alert and oriented. Well appearing and in no  distress. Eyes: Conjunctivae are normal. Normal extraocular movements. ENT   Head: Normocephalic and atraumatic.   Nose: No congestion/rhinnorhea.   Mouth/Throat: Mucous membranes are moist.   Neck: No stridor. Hematological/Lymphatic/Immunilogical: No cervical lymphadenopathy. Cardiovascular: Normal rate, regular rhythm.  No murmurs, rubs, or gallops.  Respiratory: Normal respiratory effort without tachypnea nor retractions. Breath sounds are clear and equal bilaterally. No wheezes/rales/rhonchi. Gastrointestinal: Soft and non tender. No rebound. No guarding.  Genitourinary: Deferred Musculoskeletal: Normal range of motion in all extremities. No lower extremity edema. Neurologic:  Normal speech and language. No gross focal neurologic deficits are appreciated.  Skin:  Skin is warm, dry.  Two very superficial abrasions to left forearm.  Psychiatric: Depressed. Poor eye contact. ____________________________________________    LABS (pertinent positives/negatives)  Labs Reviewed  ACETAMINOPHEN LEVEL - Abnormal; Notable for the following:       Result Value   Acetaminophen (Tylenol), Serum <10 (*)    All other components within normal limits  CBC - Abnormal; Notable for the following:    MCV 79.8 (*)    RDW 16.0 (*)    All other components within normal limits  URINE DRUG SCREEN, QUALITATIVE ([facility] ONLY) - Abnormal; Notable for the following:    Benzodiazepine, Ur Scrn POSITIVE (*)    All other components within normal limits  COMPREHENSIVE METABOLIC PANEL  ETHANOL  SALICYLATE LEVEL  POC URINE PREG, ED  POCT PREGNANCY, URINE     ____________________________________________   EKG  None  ____________________________________________    RADIOLOGY  None  ____________________________________________   PROCEDURES  Procedures  ____________________________________________   INITIAL IMPRESSION / ASSESSMENT AND PLAN / ED COURSE  Pertinent labs & imaging  results that were available during my care of the patient were reviewed by me and considered in my medical decision making (see chart for details).  Patient here with depression and cutting. Dr. Weber Cooks with psychiatry evaluated the patient. Feels she is safe to be discharged home. He has written a prescription to increase her medication. Given that patient has had some nausea recently will give antiemetic.   ____________________________________________   FINAL CLINICAL IMPRESSION(S) / ED DIAGNOSES  Final diagnoses:  Depression, unspecified depression type     Note: This dictation was prepared with Dragon dictation. Any transcriptional errors that result from this process are unintentional     Nance Pear, MD 04/07/16 1911

## 2016-04-07 NOTE — Discharge Instructions (Signed)
Please seek medical attention for any high fevers, chest pain, shortness of breath, change in behavior, persistent vomiting, bloody stool or any other new or concerning symptoms.  
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
## 2016-04-07 NOTE — ED Notes (Signed)
Pts caregiver from [facility] left contact information for discharge:  Benjamine Mola (care coordinator): [phone] [place]: [phone]

## 2016-04-07 NOTE — ED Triage Notes (Signed)
Patient presents to ED via POV with group home staff d/t SI. Patient is from [facility] independent living. Patient cut her arm on Friday. However, staff just found out because patients boyfriend had called them notifying them of self harm. Patient states she is depressed and feels her medications are not working. Patient denies SI at this time. Superficial razor cuts noted to left arm. Patient avoids eye contact during assessment.

## 2016-04-07 NOTE — Telephone Encounter (Signed)
pt caseworker from [facility] called states that pt used razers to her wrist and they wanted to know what they need to do.  she was advised to go to er no doctor in the office and that Dr. Einar Grad patient primary would out of the office for a month.  take patient to ER to be addressed with mental statues and any medication changes that may needed to be address.  And to look at wounds care also.

## 2016-04-13 ENCOUNTER — Telehealth: Payer: Self-pay

## 2016-04-13 DIAGNOSIS — F4310 Post-traumatic stress disorder, unspecified: Secondary | ICD-10-CM | POA: Diagnosis not present

## 2016-04-13 DIAGNOSIS — T1491XD Suicide attempt, subsequent encounter: Secondary | ICD-10-CM | POA: Diagnosis not present

## 2016-04-13 DIAGNOSIS — F331 Major depressive disorder, recurrent, moderate: Secondary | ICD-10-CM | POA: Diagnosis not present

## 2016-04-13 NOTE — Telephone Encounter (Signed)
called in rx for trazodone 50mg  #30 with one additional refill. pt has appt 05-21-16.

## 2016-04-13 NOTE — Telephone Encounter (Signed)
received a request for trazodone 50mg  take 1 table by mouth once daily at bedtime.

## 2016-04-27 ENCOUNTER — Emergency Department
Admission: EM | Admit: 2016-04-27 | Discharge: 2016-04-27 | Disposition: A | Discharge status: Home / Self Care | Payer: Medicare Other | Attending: Emergency Medicine | Admitting: Emergency Medicine

## 2016-04-27 ENCOUNTER — Encounter: Payer: Self-pay | Admitting: Emergency Medicine

## 2016-04-27 DIAGNOSIS — R102 Pelvic and perineal pain: Secondary | ICD-10-CM | POA: Diagnosis not present

## 2016-04-27 DIAGNOSIS — N921 Excessive and frequent menstruation with irregular cycle: Secondary | ICD-10-CM

## 2016-04-27 DIAGNOSIS — E039 Hypothyroidism, unspecified: Secondary | ICD-10-CM | POA: Diagnosis not present

## 2016-04-27 DIAGNOSIS — T384X5A Adverse effect of oral contraceptives, initial encounter: Secondary | ICD-10-CM | POA: Insufficient documentation

## 2016-04-27 DIAGNOSIS — Z79899 Other long term (current) drug therapy: Secondary | ICD-10-CM | POA: Diagnosis not present

## 2016-04-27 DIAGNOSIS — N939 Abnormal uterine and vaginal bleeding, unspecified: Secondary | ICD-10-CM | POA: Insufficient documentation

## 2016-04-27 DIAGNOSIS — N926 Irregular menstruation, unspecified: Secondary | ICD-10-CM | POA: Diagnosis not present

## 2016-04-27 DIAGNOSIS — R109 Unspecified abdominal pain: Secondary | ICD-10-CM | POA: Diagnosis not present

## 2016-04-27 LAB — CBC WITH DIFFERENTIAL/PLATELET
BASOS ABS: 0 10*3/uL (ref 0–0.1)
BASOS PCT: 0 %
EOS ABS: 0.1 10*3/uL (ref 0–0.7)
Eosinophils Relative: 1 %
HEMATOCRIT: 36.1 % (ref 35.0–47.0)
Hemoglobin: 12 g/dL (ref 12.0–16.0)
Lymphocytes Relative: 25 %
Lymphs Abs: 1.5 10*3/uL (ref 1.0–3.6)
MCH: 26 pg (ref 26.0–34.0)
MCHC: 33.3 g/dL (ref 32.0–36.0)
MCV: 77.9 fL — ABNORMAL LOW (ref 80.0–100.0)
MONO ABS: 0.4 10*3/uL (ref 0.2–0.9)
Monocytes Relative: 6 %
NEUTROS ABS: 3.9 10*3/uL (ref 1.4–6.5)
Neutrophils Relative %: 68 %
PLATELETS: 232 10*3/uL (ref 150–440)
RBC: 4.63 MIL/uL (ref 3.80–5.20)
RDW: 15.7 % — AB (ref 11.5–14.5)
WBC: 5.9 10*3/uL (ref 3.6–11.0)

## 2016-04-27 LAB — HCG, QUANTITATIVE, PREGNANCY

## 2016-04-27 MED ORDER — NAPROXEN 500 MG PO TABS: 500.0000 mg | ORAL_TABLET | Freq: Two times a day (BID) | ORAL | 2 refills | Status: DC

## 2016-04-27 MED ORDER — KETOROLAC TROMETHAMINE 30 MG/ML IJ SOLN
30.0000 mg | Freq: Once | INTRAMUSCULAR | Status: AC
  Administered 2016-04-27: 30 mg via INTRAMUSCULAR
  Filled 2016-04-27: qty 1

## 2016-04-27 NOTE — ED Notes (Signed)
Pt from home with bleeding today. States she has golf ball sized clots and has used one pad. Pt alert & oriented with NAD noted.

## 2016-04-27 NOTE — ED Notes (Signed)
Pt discharged home after verbalizing understanding of discharge instructions; nad noted. Pt c/o pain 9/10; previously 10/10. Pt calm with no difficulty breathing; speaking in complete sentences. Pt describes pain as "aggravating."

## 2016-04-27 NOTE — ED Provider Notes (Signed)
[facility] Emergency Department Provider Note   ____________________________________________    I have reviewed the triage vital signs and the nursing notes.   HISTORY  Chief Complaint Vaginal Bleeding     HPI Teresa Crawford is a 31 y.o. female who presents with complaints of vaginal bleeding and mild bilateral lower pelvic cramping. She reports symptoms started this morning. She is concerned because she passed a small amount of clot/tissue. Patient has been on birth control pills which she does not know what they are but reports compliance. She also has a history of fibroids and reports she is supposed to have a hysterectomy   Past Medical History:  Diagnosis Date  . Chronic back pain   . Chronic fatigue syndrome   . Depression   . Elevated liver enzymes   . Endometriosis   . Hepatic steatosis   . Hypothyroid   . Last menstrual period (LMP) > 10 days ago 09/12/15  . Mental retardation   . Migraine   . Posttraumatic stress disorder   . PUD (peptic ulcer disease)   . Scoliosis   . Splenomegaly   . Uterine fibroid     Patient Active Problem List   Diagnosis Date Noted  . Self-inflicted laceration of wrist 04/07/2016  . Major depressive disorder, recurrent episode, moderate ([place]) 10/29/2014  . PTSD (post-traumatic stress disorder) 10/29/2014  . Grief 10/29/2014  . CFIDS (chronic fatigue and immune dysfunction syndrome) ([place]) 09/28/2014  . Aggrieved 09/28/2014  . Depression, major, recurrent ([place]) 09/28/2014  . Intellectual disability 09/28/2014  . Neurosis, posttraumatic 09/28/2014  . History of colitis 09/22/2014  . Hepatic steatosis 09/20/2014  . Endometriosis 08/30/2013    Past Surgical History:  Procedure Laterality Date  . BACK SURGERY    . COLONOSCOPY  04/27/2006   Wohl-colitis, internal hemorrhoids  . COLONOSCOPY  02/2013   focal right colitis, left colon biopsy negative, focal active proctitis without chronicity  .  ESOPHAGOGASTRODUODENOSCOPY  02/2013   multiple peptic & duodenal ulcers, negative h pylori  . LAPAROSCOPIC ENDOMETRIOSIS FULGURATION  2015   [name]    Prior to Admission medications   Medication Sig Start Date End Date Taking? Authorizing Provider  Butalbital-APAP-Caffeine 50-300-40 MG CAPS Take by mouth.    Historical Provider, MD  cetirizine (ZYRTEC) 10 MG tablet Take 10 mg by mouth daily.    Historical Provider, MD  Cholecalciferol (VITAMIN D3) 1000 units CAPS Take by mouth.    Historical Provider, MD  cyanocobalamin (,VITAMIN B-12,) 1000 MCG/ML injection Inject 1 mL into the muscle every 30 (thirty) days.    Historical Provider, MD  diazepam (VALIUM) 5 MG tablet Insert one tablet into the vagina at bedtime 11/26/14   Historical Provider, MD  docusate sodium (COLACE) 100 MG capsule Take 100 mg by mouth 2 (two) times daily.     Historical Provider, MD  DULoxetine (CYMBALTA) 20 MG capsule Take 1 capsule (20 mg total) by mouth 2 (two) times daily. 04/07/16   Gonzella Lex, MD  EPINEPHrine (EPIPEN 2-PAK) 0.3 mg/0.3 mL IJ SOAJ injection Inject into the muscle.    Historical Provider, MD  fluticasone (FLONASE) 50 MCG/ACT nasal spray Place 1 spray into both nostrils daily.    Historical Provider, MD  hydrOXYzine (VISTARIL) 50 MG capsule Take 50 mg by mouth. 06/29/14   Historical Provider, MD  naproxen (NAPROSYN) 500 MG tablet Take 1 tablet (500 mg total) by mouth 2 (two) times daily with a meal. 04/27/16   Lavonia Drafts, MD  omeprazole (PRILOSEC) 20 MG capsule Take 20 mg by mouth 2 (two) times daily before a meal.     Historical Provider, MD  ondansetron (ZOFRAN) 4 MG tablet Take 1 tablet (4 mg total) by mouth every 8 (eight) hours as needed for nausea or vomiting. 04/07/16   Nance Pear, MD  ondansetron (ZOFRAN) 8 MG tablet Take 8 mg by mouth every 8 (eight) hours as needed for nausea or vomiting.    Historical Provider, MD  promethazine (PHENERGAN) 12.5 MG tablet Take 12.5 mg by mouth every 6 (six)  hours as needed.     Historical Provider, MD  SUMAtriptan (IMITREX) 50 MG tablet Take by mouth.    Historical Provider, MD  topiramate (TOPAMAX) 50 MG tablet Take 25 mg by mouth.     Historical Provider, MD  traMADol (ULTRAM) 50 MG tablet Take 100 mg by mouth every 6 (six) hours as needed. For pain.    Historical Provider, MD  traZODone (DESYREL) 50 MG tablet Take 1 tablet (50 mg total) by mouth at bedtime. 01/21/16   Himabindu Ravi, MD     Allergies Peanut butter flavor; Codeine; Paroxetine; Paxil [paroxetine hcl]; Venlafaxine; and Zoloft [sertraline hcl]  Family History  Problem Relation Age of Onset  . Breast cancer Mother 52  . COPD Father   . Lung cancer Father 35  . Dementia Father   . Diabetes Sister   . Diabetes Brother   . Ovarian cancer Paternal Aunt   . Colon cancer Maternal Grandfather 43  . Liver disease Maternal Aunt     ?etiology  . Lymphoma Maternal Grandmother 60  . Lung cancer Other 21    maternal great grandfather  . Stomach cancer Other 59    maternal great grandmother    Social History Social History  Substance Use Topics  . Smoking status: Never Smoker  . Smokeless tobacco: Never Used  . Alcohol use No    Review of Systems  Constitutional: No fever/chills  Gastrointestinal: As above Genitourinary: Negative for dysuria. Musculoskeletal: Negative for back pain. Skin: Negative for rash.   10-point ROS otherwise negative.  ____________________________________________   PHYSICAL EXAM:  VITAL SIGNS: ED Triage Vitals [04/27/16 0554]  Enc Vitals Group     BP (!) 141/69     Pulse Rate 94     Resp 18     Temp 98.1 F (36.7 C)     Temp Source Oral     SpO2 100 %     Weight 170 lb (77.1 kg)     Height 5\' 3"  (1.6 m)     Head Circumference      Peak Flow      Pain Score 8     Pain Loc      Pain Edu?      Excl. in [place]?    Constitutional: Alert and oriented. No acute distress. Pleasant and interactive Eyes: Conjunctivae are normal.    Head: Atraumatic Nose: No congestion/rhinnorhea. Mouth/Throat: Mucous membranes are moist.   Cardiovascular: Normal rate, regular rhythm.   Good peripheral circulation. Respiratory: Normal respiratory effort.  No retractions.  Gastrointestinal: Soft and nontender. No distention.  No CVA tenderness. Genitourinary: deferred Musculoskeletal:  Warm and well perfused Neurologic:  Normal speech and language. No gross focal neurologic deficits are appreciated.  Skin:  Skin is warm, dry and intact. No rash noted. Psychiatric: Mood and affect are normal. Speech and behavior are normal.  ____________________________________________   LABS (all labs ordered are listed, but only abnormal results  are displayed)  Labs Reviewed  CBC WITH DIFFERENTIAL/PLATELET - Abnormal; Notable for the following:       Result Value   MCV 77.9 (*)    RDW 15.7 (*)    All other components within normal limits  HCG, QUANTITATIVE, PREGNANCY   ____________________________________________  EKG  None ____________________________________________  RADIOLOGY  None ____________________________________________   PROCEDURES  Procedure(s) performed: No    Critical Care performed: No ____________________________________________   INITIAL IMPRESSION / ASSESSMENT AND PLAN / ED COURSE  Pertinent labs & imaging results that were available during my care of the patient were reviewed by me and considered in my medical decision making (see chart for details).  Patient overall well-appearing and in no acute distress. Vital signs are reassuring. Hemoglobin is normal, beta hCG is negative. Exam is reassuring. Suspect mild breakthrough bleeding no indication for further imaging at this time. We will treat her with Toradol IM and reevaluate.    Patient had significant improvement in pain, we will discharge with NSAIDs PCP follow-up. __________________________________________   FINAL CLINICAL IMPRESSION(S) / ED  DIAGNOSES  Final diagnoses:  Breakthrough bleeding on birth control pills      NEW MEDICATIONS STARTED DURING THIS VISIT:  Discharge Medication List as of 04/27/2016  9:18 AM    START taking these medications   Details  naproxen (NAPROSYN) 500 MG tablet Take 1 tablet (500 mg total) by mouth 2 (two) times daily with a meal., Starting Mon 04/27/2016, Print         Note:  This document was prepared using Dragon voice recognition software and may include unintentional dictation errors.    Lavonia Drafts, MD 04/27/16 1104

## 2016-04-27 NOTE — ED Triage Notes (Signed)
Patient brought in by ems from home. Patient states that she has been on birth control pills that keep her from having a period times one year. Patient states that she started having vaginal bleeding and abdominal cramping tonight. Patient states that she passed some tissue about 04:00 this am.

## 2016-05-11 NOTE — Telephone Encounter (Signed)
Please call patient and have her scheduled with me at the earliest appointment.

## 2016-05-15 DIAGNOSIS — J028 Acute pharyngitis due to other specified organisms: Secondary | ICD-10-CM | POA: Diagnosis not present

## 2016-05-15 DIAGNOSIS — B9789 Other viral agents as the cause of diseases classified elsewhere: Secondary | ICD-10-CM | POA: Diagnosis not present

## 2016-05-15 DIAGNOSIS — J988 Other specified respiratory disorders: Secondary | ICD-10-CM | POA: Diagnosis not present

## 2016-05-15 DIAGNOSIS — J029 Acute pharyngitis, unspecified: Secondary | ICD-10-CM | POA: Diagnosis not present

## 2016-05-21 ENCOUNTER — Ambulatory Visit: Payer: Medicare Other | Admitting: Psychiatry

## 2016-05-27 ENCOUNTER — Ambulatory Visit (INDEPENDENT_AMBULATORY_CARE_PROVIDER_SITE_OTHER): Payer: Medicare Other | Admitting: Psychiatry

## 2016-05-27 ENCOUNTER — Encounter: Payer: Self-pay | Admitting: Psychiatry

## 2016-05-27 VITALS — BP 109/69 | HR 96 | Temp 98.2°F | Wt 184.8 lb

## 2016-05-27 DIAGNOSIS — F331 Major depressive disorder, recurrent, moderate: Secondary | ICD-10-CM

## 2016-05-27 DIAGNOSIS — F79 Unspecified intellectual disabilities: Secondary | ICD-10-CM

## 2016-05-27 DIAGNOSIS — F4310 Post-traumatic stress disorder, unspecified: Secondary | ICD-10-CM | POA: Diagnosis not present

## 2016-05-27 MED ORDER — TRAZODONE HCL 50 MG PO TABS: 50.0000 mg | ORAL_TABLET | Freq: Every day | ORAL | 3 refills | Status: DC

## 2016-05-27 MED ORDER — DULOXETINE HCL 20 MG PO CPEP: 20.0000 mg | ORAL_CAPSULE | Freq: Two times a day (BID) | ORAL | 1 refills | Status: DC

## 2016-05-27 NOTE — Progress Notes (Signed)
Patient ID: Teresa Crawford, female   DOB: 1985/07/14, 31 y.o.   MRN: [id number]  [facility] MD/PA/NP OP Progress Note  05/27/2016 11:53 AM Teresa Crawford  MRN:  [id number]  Subjective:  Follow-up of her posttraumatic stress disorder and major depressive disorder, recurrent moderate. Reports doing well currently. Patient reports that back in January she was feeling depressed and had cut on herself. At the time her Cymbalta was increased to 40 mg daily. States that she is responding well to that and has been feeling much better mood wise. Denies any side effects. Reports she is due for a hysterectomy next week because of fibroid tumors and heavy bleeding. Denies any current suicidal thoughts. She is currently not working since she wants to get over with her surgery.  Chief Complaint: Doing well Chief Complaint    Follow-up; Medication Refill     Visit Diagnosis:     ICD-9-CM ICD-10-CM   1. Major depressive disorder, recurrent episode, moderate (HCC) 296.32 F33.1   2. PTSD (post-traumatic stress disorder) 309.81 F43.10   3. Intellectual disability [address]     Past Medical History:  Past Medical History:  Diagnosis Date  . Chronic back pain   . Chronic fatigue syndrome   . Depression   . Elevated liver enzymes   . Endometriosis   . Hepatic steatosis   . Hypothyroid   . Last menstrual period (LMP) > 10 days ago 09/12/15  . Mental retardation   . Migraine   . Posttraumatic stress disorder   . PUD (peptic ulcer disease)   . Scoliosis   . Splenomegaly   . Uterine fibroid     Past Surgical History:  Procedure Laterality Date  . BACK SURGERY    . COLONOSCOPY  04/27/2006   Wohl-colitis, internal hemorrhoids  . COLONOSCOPY  02/2013   focal right colitis, left colon biopsy negative, focal active proctitis without chronicity  . ESOPHAGOGASTRODUODENOSCOPY  02/2013   multiple peptic & duodenal ulcers, negative h pylori  . LAPAROSCOPIC ENDOMETRIOSIS FULGURATION  2015   [name]   Family History:   Family History  Problem Relation Age of Onset  . Breast cancer Mother 70  . COPD Father   . Lung cancer Father 28  . Dementia Father   . Diabetes Sister   . Diabetes Brother   . Ovarian cancer Paternal Aunt   . Colon cancer Maternal Grandfather 33  . Liver disease Maternal Aunt     ?etiology  . Lymphoma Maternal Grandmother 60  . Lung cancer Other 6    maternal great grandfather  . Stomach cancer Other 89    maternal great grandmother   Social History:  Social History   Social History  . Marital status: Single    Spouse name: N/A  . Number of children: 0  . Years of education: N/A   Social History Main Topics  . Smoking status: Never Smoker  . Smokeless tobacco: Never Used  . Alcohol use No  . Drug use: No  . Sexual activity: Not Asked   Other Topics Concern  . None   Social History Narrative   She has brother, half brother & sister live nearby   Both parents deceased   Care managed by [name]   Additional History:   Assessment:   Musculoskeletal: Strength & Muscle Tone: within normal limits Gait & Station: normal Patient leans: N/A  Psychiatric Specialty Exam: Medication Refill   Depression         Associated symptoms include  does not have insomnia and no suicidal ideas.   Review of Systems  Endo/Heme/Allergies:       Patient reports that have an ultrasound of her thyroid.  Psychiatric/Behavioral: Negative for depression, hallucinations, memory loss, substance abuse and suicidal ideas. The patient is not nervous/anxious and does not have insomnia.   All other systems reviewed and are negative.   Blood pressure 109/69, pulse 96, temperature 98.2 F (36.8 C), temperature source Oral, weight 184 lb 12.8 oz (83.8 kg).Body mass index is 32.74 kg/m.  General Appearance: Fairly Groomed  Eye Contact:  Fair  Speech:  Clear and Coherent and Normal Rate  Volume:  Normal  Mood:  Good  Affect:  Congruent   Thought Process:  Concrete  Orientation:   Full (Time, Place, and Person)  Thought Content:  Negative  Suicidal Thoughts:  No  Homicidal Thoughts:  No  Memory:  Immediate;   Good Recent;   Good Remote;   Good  Judgement:  Fair  Insight:  Fair  Psychomotor Activity:  Negative  Concentration:  Good  Recall:  Good  Fund of Knowledge: Fair  Language: Fair  Akathisia:  Negative  Handed:  Right unknown  AIMS (if indicated):  Not done  Assets:  Social Support  ADL's:  Intact  Cognition: Impaired,  Moderate  Sleep:  Good    Is the patient at risk to self?  No. Has the patient been a risk to self in the past 6 months?  No. Has the patient been a risk to self within the distant past?  No. Is the patient a risk to others?  No. Has the patient been a risk to others in the past 6 months?  No. Has the patient been a risk to others within the distant past?  No.  Current Medications: Current Outpatient Prescriptions  Medication Sig Dispense Refill  . Butalbital-APAP-Caffeine 50-300-40 MG CAPS Take by mouth.    . cetirizine (ZYRTEC) 10 MG tablet Take 10 mg by mouth daily.    . Cholecalciferol (VITAMIN D3) 1000 units CAPS Take by mouth.    . cyanocobalamin (,VITAMIN B-12,) 1000 MCG/ML injection Inject 1 mL into the muscle every 30 (thirty) days.    . diazepam (VALIUM) 5 MG tablet Insert one tablet into the vagina at bedtime    . docusate sodium (COLACE) 100 MG capsule Take 100 mg by mouth 2 (two) times daily.     . DULoxetine (CYMBALTA) 20 MG capsule Take 1 capsule (20 mg total) by mouth 2 (two) times daily. 60 capsule 1  . EPINEPHrine (EPIPEN 2-PAK) 0.3 mg/0.3 mL IJ SOAJ injection Inject into the muscle.    . fluticasone (FLONASE) 50 MCG/ACT nasal spray Place 1 spray into both nostrils daily.    . hydrOXYzine (VISTARIL) 50 MG capsule Take 50 mg by mouth.    . naproxen (NAPROSYN) 500 MG tablet Take 1 tablet (500 mg total) by mouth 2 (two) times daily with a meal. 20 tablet 2  . omeprazole (PRILOSEC) 20 MG capsule Take 20 mg by mouth  2 (two) times daily before a meal.     . ondansetron (ZOFRAN) 4 MG tablet Take 1 tablet (4 mg total) by mouth every 8 (eight) hours as needed for nausea or vomiting. 20 tablet 0  . ondansetron (ZOFRAN) 8 MG tablet Take 8 mg by mouth every 8 (eight) hours as needed for nausea or vomiting.    . promethazine (PHENERGAN) 12.5 MG tablet Take 12.5 mg by mouth every 6 (six) hours  as needed.     . SUMAtriptan (IMITREX) 50 MG tablet Take by mouth.    . topiramate (TOPAMAX) 50 MG tablet Take 25 mg by mouth.     . traMADol (ULTRAM) 50 MG tablet Take 100 mg by mouth every 6 (six) hours as needed. For pain.    . traZODone (DESYREL) 50 MG tablet Take 1 tablet (50 mg total) by mouth at bedtime. 30 tablet 3   No current facility-administered medications for this visit.     Medical Decision Making:  Established Problem, Stable/Improving (1)  Treatment Plan Summary:Medication management   Post traumatic stress disorder- Continue  Cymbalta 40 mg daily  Patient educated about interactions with tramadol and Cymbalta. Patient reports that she takes the tramadol occasionally when she is in a lot of pain. She was made aware of serotonin syndrome with the Cymbalta and the tramadol.  Major depressive disorder, recurrent, moderate- Same as above  Insomnia- Continue to take Trazodone at 50mg  po qhs.  Patient is also on Valium prescribed by another doctor for pelvic muscle spasms.  Patient will return in  3 months. She has  been encouraged call with any questions or concerns prior to her next appointment.  Emad Brechtel 05/27/2016, 11:53 AM

## 2016-06-02 DIAGNOSIS — N920 Excessive and frequent menstruation with regular cycle: Secondary | ICD-10-CM | POA: Diagnosis not present

## 2016-06-02 DIAGNOSIS — G43019 Migraine without aura, intractable, without status migrainosus: Secondary | ICD-10-CM | POA: Diagnosis not present

## 2016-06-02 DIAGNOSIS — F4310 Post-traumatic stress disorder, unspecified: Secondary | ICD-10-CM | POA: Diagnosis not present

## 2016-06-02 DIAGNOSIS — F331 Major depressive disorder, recurrent, moderate: Secondary | ICD-10-CM | POA: Diagnosis not present

## 2016-06-03 DIAGNOSIS — Z7951 Long term (current) use of inhaled steroids: Secondary | ICD-10-CM | POA: Diagnosis not present

## 2016-06-03 DIAGNOSIS — K219 Gastro-esophageal reflux disease without esophagitis: Secondary | ICD-10-CM | POA: Diagnosis not present

## 2016-06-03 DIAGNOSIS — R0602 Shortness of breath: Secondary | ICD-10-CM | POA: Diagnosis not present

## 2016-06-03 DIAGNOSIS — Z79891 Long term (current) use of opiate analgesic: Secondary | ICD-10-CM | POA: Diagnosis not present

## 2016-06-03 DIAGNOSIS — Z6830 Body mass index (BMI) 30.0-30.9, adult: Secondary | ICD-10-CM | POA: Diagnosis not present

## 2016-06-03 DIAGNOSIS — N803 Endometriosis of pelvic peritoneum: Secondary | ICD-10-CM | POA: Diagnosis not present

## 2016-06-03 DIAGNOSIS — R102 Pelvic and perineal pain: Secondary | ICD-10-CM | POA: Diagnosis not present

## 2016-06-03 DIAGNOSIS — R6250 Unspecified lack of expected normal physiological development in childhood: Secondary | ICD-10-CM | POA: Diagnosis not present

## 2016-06-03 DIAGNOSIS — N939 Abnormal uterine and vaginal bleeding, unspecified: Secondary | ICD-10-CM | POA: Diagnosis not present

## 2016-06-03 DIAGNOSIS — Z888 Allergy status to other drugs, medicaments and biological substances status: Secondary | ICD-10-CM | POA: Diagnosis not present

## 2016-06-03 DIAGNOSIS — G8929 Other chronic pain: Secondary | ICD-10-CM | POA: Diagnosis not present

## 2016-06-03 DIAGNOSIS — Z803 Family history of malignant neoplasm of breast: Secondary | ICD-10-CM | POA: Diagnosis not present

## 2016-06-03 DIAGNOSIS — Z885 Allergy status to narcotic agent status: Secondary | ICD-10-CM | POA: Diagnosis not present

## 2016-06-03 DIAGNOSIS — M791 Myalgia: Secondary | ICD-10-CM | POA: Diagnosis not present

## 2016-06-03 DIAGNOSIS — R109 Unspecified abdominal pain: Secondary | ICD-10-CM | POA: Diagnosis not present

## 2016-06-03 DIAGNOSIS — N852 Hypertrophy of uterus: Secondary | ICD-10-CM | POA: Diagnosis not present

## 2016-06-03 DIAGNOSIS — N135 Crossing vessel and stricture of ureter without hydronephrosis: Secondary | ICD-10-CM | POA: Diagnosis not present

## 2016-06-03 DIAGNOSIS — D259 Leiomyoma of uterus, unspecified: Secondary | ICD-10-CM | POA: Diagnosis not present

## 2016-06-03 DIAGNOSIS — E669 Obesity, unspecified: Secondary | ICD-10-CM | POA: Diagnosis not present

## 2016-06-03 DIAGNOSIS — D252 Subserosal leiomyoma of uterus: Secondary | ICD-10-CM | POA: Diagnosis not present

## 2016-06-03 DIAGNOSIS — Z800 Family history of malignant neoplasm of digestive organs: Secondary | ICD-10-CM | POA: Diagnosis not present

## 2016-06-03 DIAGNOSIS — Z79899 Other long term (current) drug therapy: Secondary | ICD-10-CM | POA: Diagnosis not present

## 2016-06-03 DIAGNOSIS — J309 Allergic rhinitis, unspecified: Secondary | ICD-10-CM | POA: Diagnosis not present

## 2016-06-03 HISTORY — PX: ABDOMINAL HYSTERECTOMY: SHX81

## 2016-06-04 DIAGNOSIS — D252 Subserosal leiomyoma of uterus: Secondary | ICD-10-CM | POA: Diagnosis not present

## 2016-06-04 DIAGNOSIS — M791 Myalgia: Secondary | ICD-10-CM | POA: Diagnosis not present

## 2016-06-04 DIAGNOSIS — N803 Endometriosis of pelvic peritoneum: Secondary | ICD-10-CM | POA: Diagnosis not present

## 2016-06-04 DIAGNOSIS — N135 Crossing vessel and stricture of ureter without hydronephrosis: Secondary | ICD-10-CM | POA: Diagnosis not present

## 2016-06-04 DIAGNOSIS — Z800 Family history of malignant neoplasm of digestive organs: Secondary | ICD-10-CM | POA: Diagnosis not present

## 2016-06-04 DIAGNOSIS — N939 Abnormal uterine and vaginal bleeding, unspecified: Secondary | ICD-10-CM | POA: Diagnosis not present

## 2016-06-05 DIAGNOSIS — Z800 Family history of malignant neoplasm of digestive organs: Secondary | ICD-10-CM | POA: Diagnosis not present

## 2016-06-05 DIAGNOSIS — D250 Submucous leiomyoma of uterus: Secondary | ICD-10-CM | POA: Diagnosis not present

## 2016-06-05 DIAGNOSIS — N803 Endometriosis of pelvic peritoneum: Secondary | ICD-10-CM | POA: Diagnosis not present

## 2016-06-05 DIAGNOSIS — N939 Abnormal uterine and vaginal bleeding, unspecified: Secondary | ICD-10-CM | POA: Diagnosis not present

## 2016-06-05 DIAGNOSIS — M791 Myalgia: Secondary | ICD-10-CM | POA: Diagnosis not present

## 2016-06-05 DIAGNOSIS — D252 Subserosal leiomyoma of uterus: Secondary | ICD-10-CM | POA: Diagnosis not present

## 2016-06-05 DIAGNOSIS — N135 Crossing vessel and stricture of ureter without hydronephrosis: Secondary | ICD-10-CM | POA: Diagnosis not present

## 2016-06-09 ENCOUNTER — Emergency Department
Admission: EM | Admit: 2016-06-09 | Discharge: 2016-06-10 | Disposition: A | Discharge status: Home / Self Care | Payer: Medicare Other | Attending: Emergency Medicine | Admitting: Emergency Medicine

## 2016-06-09 DIAGNOSIS — E039 Hypothyroidism, unspecified: Secondary | ICD-10-CM | POA: Diagnosis not present

## 2016-06-09 DIAGNOSIS — Z79899 Other long term (current) drug therapy: Secondary | ICD-10-CM | POA: Insufficient documentation

## 2016-06-09 DIAGNOSIS — N3090 Cystitis, unspecified without hematuria: Secondary | ICD-10-CM | POA: Diagnosis not present

## 2016-06-09 DIAGNOSIS — E860 Dehydration: Secondary | ICD-10-CM | POA: Diagnosis not present

## 2016-06-09 DIAGNOSIS — R339 Retention of urine, unspecified: Secondary | ICD-10-CM | POA: Diagnosis present

## 2016-06-09 LAB — URINALYSIS, COMPLETE (UACMP) WITH MICROSCOPIC
Bacteria, UA: NONE SEEN
Bilirubin Urine: NEGATIVE
GLUCOSE, UA: NEGATIVE mg/dL
Hgb urine dipstick: NEGATIVE
Ketones, ur: NEGATIVE mg/dL
Leukocytes, UA: NEGATIVE
Nitrite: NEGATIVE
PH: 7 (ref 5.0–8.0)
Protein, ur: NEGATIVE mg/dL
SPECIFIC GRAVITY, URINE: 1.027 (ref 1.005–1.030)
SQUAMOUS EPITHELIAL / LPF: NONE SEEN

## 2016-06-09 LAB — TYPE AND SCREEN
ABO/RH(D): A POS
ANTIBODY SCREEN: NEGATIVE

## 2016-06-09 LAB — COMPREHENSIVE METABOLIC PANEL
ALT: 11 U/L — ABNORMAL LOW (ref 14–54)
AST: 13 U/L — ABNORMAL LOW (ref 15–41)
Albumin: 3.2 g/dL — ABNORMAL LOW (ref 3.5–5.0)
Alkaline Phosphatase: 85 U/L (ref 38–126)
Anion gap: 5 (ref 5–15)
BILIRUBIN TOTAL: 0.2 mg/dL — AB (ref 0.3–1.2)
BUN: 10 mg/dL (ref 6–20)
CHLORIDE: 111 mmol/L (ref 101–111)
CO2: 21 mmol/L — ABNORMAL LOW (ref 22–32)
Calcium: 8.5 mg/dL — ABNORMAL LOW (ref 8.9–10.3)
Creatinine, Ser: 0.7 mg/dL (ref 0.44–1.00)
GFR calc non Af Amer: >60 mL/min (ref >60)
Glucose, Bld: 105 mg/dL — ABNORMAL HIGH (ref 65–99)
POTASSIUM: 3.7 mmol/L (ref 3.5–5.1)
Sodium: 137 mmol/L (ref 135–145)
TOTAL PROTEIN: 6.9 g/dL (ref 6.5–8.1)

## 2016-06-09 LAB — CBC
HCT: 36.3 % (ref 35.0–47.0)
Hemoglobin: 11.9 g/dL — ABNORMAL LOW (ref 12.0–16.0)
MCH: 25.7 pg — ABNORMAL LOW (ref 26.0–34.0)
MCHC: 32.7 g/dL (ref 32.0–36.0)
MCV: 78.4 fL — AB (ref 80.0–100.0)
PLATELETS: 302 10*3/uL (ref 150–440)
RBC: 4.63 MIL/uL (ref 3.80–5.20)
RDW: 16.1 % — ABNORMAL HIGH (ref 11.5–14.5)
WBC: 7.2 10*3/uL (ref 3.6–11.0)

## 2016-06-09 MED ORDER — CIPROFLOXACIN HCL 500 MG PO TABS: 500.0000 mg | ORAL_TABLET | Freq: Two times a day (BID) | ORAL | 0 refills | Status: DC

## 2016-06-09 MED ORDER — ONDANSETRON 4 MG PO TBDP: 4.0000 mg | ORAL_TABLET | Freq: Three times a day (TID) | ORAL | 0 refills | Status: DC | PRN

## 2016-06-09 NOTE — ED Notes (Signed)
In and out cath done    Pt tolerated well.  approx 200cc urine drained   ua to lab.

## 2016-06-09 NOTE — ED Provider Notes (Signed)
[facility] Emergency Department Provider Note  ____________________________________________  Time seen: Approximately 11:24 PM  I have reviewed the triage vital signs and the nursing notes.   HISTORY  Chief Complaint Urinary Retention and Rectal Bleeding    HPI NORMALEE FICKES is a 31 y.o. female who complains of low urine output today. Last voided this morning. She had a hysterectomy 5 days ago, and was able to urinate normally until today. She reports poor oral intake. Also some suprapubic pain. No fever or chills. No dizziness or syncope. No chest pain or shortness of breath.     Past Medical History:  Diagnosis Date  . Chronic back pain   . Chronic fatigue syndrome   . Depression   . Elevated liver enzymes   . Endometriosis   . Hepatic steatosis   . Hypothyroid   . Last menstrual period (LMP) > 10 days ago 09/12/15  . Mental retardation   . Migraine   . Posttraumatic stress disorder   . PUD (peptic ulcer disease)   . Scoliosis   . Splenomegaly   . Uterine fibroid      Patient Active Problem List   Diagnosis Date Noted  . Self-inflicted laceration of wrist 04/07/2016  . Major depressive disorder, recurrent episode, moderate ([place]) 10/29/2014  . PTSD (post-traumatic stress disorder) 10/29/2014  . Grief 10/29/2014  . CFIDS (chronic fatigue and immune dysfunction syndrome) ([place]) 09/28/2014  . Aggrieved 09/28/2014  . Depression, major, recurrent ([place]) 09/28/2014  . Intellectual disability 09/28/2014  . Neurosis, posttraumatic 09/28/2014  . History of colitis 09/22/2014  . Hepatic steatosis 09/20/2014  . Endometriosis 08/30/2013     Past Surgical History:  Procedure Laterality Date  . ABDOMINAL HYSTERECTOMY    . BACK SURGERY    . COLONOSCOPY  04/27/2006   Wohl-colitis, internal hemorrhoids  . COLONOSCOPY  02/2013   focal right colitis, left colon biopsy negative, focal active proctitis without chronicity  .  ESOPHAGOGASTRODUODENOSCOPY  02/2013   multiple peptic & duodenal ulcers, negative h pylori  . LAPAROSCOPIC ENDOMETRIOSIS FULGURATION  2015   [name]     Prior to Admission medications   Medication Sig Start Date End Date Taking? Authorizing Provider  Butalbital-APAP-Caffeine 50-300-40 MG CAPS Take by mouth.    Historical Provider, MD  cetirizine (ZYRTEC) 10 MG tablet Take 10 mg by mouth daily.    Historical Provider, MD  Cholecalciferol (VITAMIN D3) 1000 units CAPS Take by mouth.    Historical Provider, MD  ciprofloxacin (CIPRO) 500 MG tablet Take 1 tablet (500 mg total) by mouth 2 (two) times daily. 06/09/16   Carrie Mew, MD  cyanocobalamin (,VITAMIN B-12,) 1000 MCG/ML injection Inject 1 mL into the muscle every 30 (thirty) days.    Historical Provider, MD  diazepam (VALIUM) 5 MG tablet Insert one tablet into the vagina at bedtime 11/26/14   Historical Provider, MD  docusate sodium (COLACE) 100 MG capsule Take 100 mg by mouth 2 (two) times daily.     Historical Provider, MD  DULoxetine (CYMBALTA) 20 MG capsule Take 1 capsule (20 mg total) by mouth 2 (two) times daily. 05/27/16   Himabindu Ravi, MD  EPINEPHrine (EPIPEN 2-PAK) 0.3 mg/0.3 mL IJ SOAJ injection Inject into the muscle.    Historical Provider, MD  fluticasone (FLONASE) 50 MCG/ACT nasal spray Place 1 spray into both nostrils daily.    Historical Provider, MD  hydrOXYzine (VISTARIL) 50 MG capsule Take 50 mg by mouth. 06/29/14   Historical Provider, MD  naproxen (NAPROSYN) 500  MG tablet Take 1 tablet (500 mg total) by mouth 2 (two) times daily with a meal. 04/27/16   Lavonia Drafts, MD  omeprazole (PRILOSEC) 20 MG capsule Take 20 mg by mouth 2 (two) times daily before a meal.     Historical Provider, MD  ondansetron (ZOFRAN ODT) 4 MG disintegrating tablet Take 1 tablet (4 mg total) by mouth every 8 (eight) hours as needed for nausea or vomiting. 06/09/16   Carrie Mew, MD  ondansetron (ZOFRAN) 4 MG tablet Take 1 tablet (4 mg total) by  mouth every 8 (eight) hours as needed for nausea or vomiting. 04/07/16   Nance Pear, MD  ondansetron (ZOFRAN) 8 MG tablet Take 8 mg by mouth every 8 (eight) hours as needed for nausea or vomiting.    Historical Provider, MD  promethazine (PHENERGAN) 12.5 MG tablet Take 12.5 mg by mouth every 6 (six) hours as needed.     Historical Provider, MD  SUMAtriptan (IMITREX) 50 MG tablet Take by mouth.    Historical Provider, MD  topiramate (TOPAMAX) 50 MG tablet Take 25 mg by mouth.     Historical Provider, MD  traMADol (ULTRAM) 50 MG tablet Take 100 mg by mouth every 6 (six) hours as needed. For pain.    Historical Provider, MD  traZODone (DESYREL) 50 MG tablet Take 1 tablet (50 mg total) by mouth at bedtime. 05/27/16   Himabindu Ravi, MD     Allergies Peanut butter flavor; Codeine; Paroxetine; Paxil [paroxetine hcl]; Venlafaxine; and Zoloft [sertraline hcl]   Family History  Problem Relation Age of Onset  . Breast cancer Mother 47  . COPD Father   . Lung cancer Father 15  . Dementia Father   . Diabetes Sister   . Diabetes Brother   . Ovarian cancer Paternal Aunt   . Colon cancer Maternal Grandfather 55  . Liver disease Maternal Aunt     ?etiology  . Lymphoma Maternal Grandmother 60  . Lung cancer Other 65    maternal great grandfather  . Stomach cancer Other 69    maternal great grandmother    Social History Social History  Substance Use Topics  . Smoking status: Never Smoker  . Smokeless tobacco: Never Used  . Alcohol use No    Review of Systems  Constitutional:   No fever or chills.  ENT:   No sore throat. No rhinorrhea. Cardiovascular:   No chest pain. Respiratory:   No dyspnea or cough. Gastrointestinal:   Positive suprapubic pain without vomiting and diarrhea.  Genitourinary:   Positive difficulty urinating.. Musculoskeletal:   Negative for focal pain or swelling Neurological:   Negative for headaches 10-point ROS otherwise  negative.  ____________________________________________   PHYSICAL EXAM:  VITAL SIGNS: ED Triage Vitals  Enc Vitals Group     BP 06/09/16 2036 96/69     Pulse Rate 06/09/16 2036 97     Resp 06/09/16 2036 18     Temp 06/09/16 2036 98 F (36.7 C)     Temp Source 06/09/16 2036 Oral     SpO2 06/09/16 2036 98 %     Weight 06/09/16 2037 184 lb (83.5 kg)     Height --      Head Circumference --      Peak Flow --      Pain Score 06/09/16 2037 9     Pain Loc --      Pain Edu? --      Excl. in [place]? --     Vital  signs reviewed, nursing assessments reviewed.   Constitutional:   Alert and oriented. Well appearing and in no distress. Eyes:   No scleral icterus. No conjunctival pallor. PERRL. EOMI.  No nystagmus. ENT   Head:   Normocephalic and atraumatic.   Nose:   No congestion/rhinnorhea. No septal hematoma   Mouth/Throat:   MMM, no pharyngeal erythema. No peritonsillar mass.    Neck:   No stridor. No SubQ emphysema. No meningismus. Hematological/Lymphatic/Immunilogical:   No cervical lymphadenopathy. Cardiovascular:   RRR. Symmetric bilateral radial and DP pulses.  No murmurs.  Respiratory:   Normal respiratory effort without tachypnea nor retractions. Breath sounds are clear and equal bilaterally. No wheezes/rales/rhonchi. Gastrointestinal:   Soft with suprapubic tenderness. Surgical incisions well-healed.. Non distended. There is no CVA tenderness.  No rebound, rigidity, or guarding. Genitourinary:   deferred Musculoskeletal:   Normal range of motion in all extremities. No joint effusions.  No lower extremity tenderness.  No edema. Neurologic:   Normal speech and language.  CN 2-10 normal. Motor grossly intact. No gross focal neurologic deficits are appreciated.  Skin:    Skin is warm, dry and intact. No rash noted.  No petechiae, purpura, or bullae.  ____________________________________________    LABS (pertinent positives/negatives) (all labs ordered are  listed, but only abnormal results are displayed) Labs Reviewed  COMPREHENSIVE METABOLIC PANEL - Abnormal; Notable for the following:       Result Value   CO2 21 (*)    Glucose, Bld 105 (*)    Calcium 8.5 (*)    Albumin 3.2 (*)    AST 13 (*)    ALT 11 (*)    Total Bilirubin 0.2 (*)    All other components within normal limits  CBC - Abnormal; Notable for the following:    Hemoglobin 11.9 (*)    MCV 78.4 (*)    MCH 25.7 (*)    RDW 16.1 (*)    All other components within normal limits  URINALYSIS, COMPLETE (UACMP) WITH MICROSCOPIC - Abnormal; Notable for the following:    Color, Urine YELLOW (*)    APPearance CLOUDY (*)    All other components within normal limits  URINE CULTURE  POC OCCULT BLOOD, ED  TYPE AND SCREEN   ____________________________________________   EKG  Interpreted by me Normal sinus rhythm rate of 94, normal axis intervals QRS ST segments and T waves  ____________________________________________    RADIOLOGY  No results found.  ____________________________________________   PROCEDURES Procedures  ____________________________________________   INITIAL IMPRESSION / ASSESSMENT AND PLAN / ED COURSE  Pertinent labs & imaging results that were available during my care of the patient were reviewed by me and considered in my medical decision making (see chart for details).  Patient presents with difficulty urinating, suprapubic tenderness 5 days postop. Exam is not consistent with a postop infection of the abdomen. I suspect she is having a UTI with some bladder spasm, or possibly dehydration. Bladder scan showed 200 mL's. Patient was cathetered which again produced only 200 mL's. We'll have her drink lots of fluid while checking a urinalysis.    ----------------------------------------- 11:57 PM on 06/09/2016 -----------------------------------------  Tolerating oral fluids very well. Urinalysis shows white cells in the urine. With her exam and  symptoms, consistent with cystitis. All start Cipro. Urine culture sent. Zofran when necessary. Follow-up with primary care. Labs and vital signs unremarkable. No pyelonephritis or sepsis.Considering the patient's symptoms, medical history, and physical examination today, I have low suspicion for cholecystitis or biliary pathology,  pancreatitis, perforation or bowel obstruction, hernia, intra-abdominal abscess, AAA or dissection, volvulus or intussusception, mesenteric ischemia, or appendicitis.         ____________________________________________   FINAL CLINICAL IMPRESSION(S) / ED DIAGNOSES  Final diagnoses:  Cystitis  Mild dehydration      New Prescriptions   CIPROFLOXACIN (CIPRO) 500 MG TABLET    Take 1 tablet (500 mg total) by mouth 2 (two) times daily.   ONDANSETRON (ZOFRAN ODT) 4 MG DISINTEGRATING TABLET    Take 1 tablet (4 mg total) by mouth every 8 (eight) hours as needed for nausea or vomiting.     Portions of this note were generated with dragon dictation software. Dictation errors may occur despite best attempts at proofreading.    Carrie Mew, MD 06/09/16 [id number]

## 2016-06-09 NOTE — ED Triage Notes (Signed)
Pt reports to ED w/ c/o anuria.  Pt sts that she has not urinated today. Sts that she had hysterectomy Wednesday, went home Friday and had no issues urinating until today. Denies constipation, n/v/d, CP, SOB, LOC or fevers.  Pt alert and oriented. Pt also c/o rectal bleeding, ongoing "for a few days'.

## 2016-06-09 NOTE — ED Notes (Signed)
Pt states she had hysterectomy last week in [place].  Today, pt woke up and urinated and states she has not voided since this morning.  No back pain.  Incisions on abd clean and dry.  No redness or drainage noted.

## 2016-06-10 DIAGNOSIS — R1030 Lower abdominal pain, unspecified: Secondary | ICD-10-CM | POA: Diagnosis not present

## 2016-06-10 DIAGNOSIS — Z90710 Acquired absence of both cervix and uterus: Secondary | ICD-10-CM | POA: Diagnosis not present

## 2016-06-10 DIAGNOSIS — Z79899 Other long term (current) drug therapy: Secondary | ICD-10-CM | POA: Diagnosis not present

## 2016-06-10 DIAGNOSIS — E860 Dehydration: Secondary | ICD-10-CM | POA: Diagnosis not present

## 2016-06-10 DIAGNOSIS — N3090 Cystitis, unspecified without hematuria: Secondary | ICD-10-CM | POA: Diagnosis not present

## 2016-06-10 DIAGNOSIS — Z7951 Long term (current) use of inhaled steroids: Secondary | ICD-10-CM | POA: Diagnosis not present

## 2016-06-10 DIAGNOSIS — N390 Urinary tract infection, site not specified: Secondary | ICD-10-CM | POA: Diagnosis not present

## 2016-06-10 DIAGNOSIS — R102 Pelvic and perineal pain: Secondary | ICD-10-CM | POA: Diagnosis not present

## 2016-06-10 DIAGNOSIS — R339 Retention of urine, unspecified: Secondary | ICD-10-CM | POA: Diagnosis not present

## 2016-06-10 MED ORDER — CIPROFLOXACIN HCL 500 MG PO TABS
500.0000 mg | ORAL_TABLET | Freq: Once | ORAL | Status: AC
  Administered 2016-06-10: 500 mg via ORAL
  Filled 2016-06-10: qty 1

## 2016-06-11 LAB — URINE CULTURE

## 2016-07-29 DIAGNOSIS — E039 Hypothyroidism, unspecified: Secondary | ICD-10-CM | POA: Diagnosis not present

## 2016-07-29 DIAGNOSIS — H6691 Otitis media, unspecified, right ear: Secondary | ICD-10-CM | POA: Diagnosis not present

## 2016-07-31 ENCOUNTER — Ambulatory Visit (INDEPENDENT_AMBULATORY_CARE_PROVIDER_SITE_OTHER): Payer: Medicare Other | Admitting: Psychiatry

## 2016-07-31 ENCOUNTER — Encounter: Payer: Self-pay | Admitting: Psychiatry

## 2016-07-31 VITALS — BP 115/77 | HR 99 | Temp 97.6°F | Wt 193.0 lb

## 2016-07-31 DIAGNOSIS — F331 Major depressive disorder, recurrent, moderate: Secondary | ICD-10-CM

## 2016-07-31 DIAGNOSIS — F4310 Post-traumatic stress disorder, unspecified: Secondary | ICD-10-CM | POA: Diagnosis not present

## 2016-07-31 MED ORDER — DULOXETINE HCL 30 MG PO CPEP: 30.0000 mg | ORAL_CAPSULE | Freq: Every day | ORAL | 2 refills | Status: DC

## 2016-07-31 MED ORDER — TRAZODONE HCL 50 MG PO TABS: 50.0000 mg | ORAL_TABLET | Freq: Every day | ORAL | 3 refills | Status: DC

## 2016-07-31 NOTE — Progress Notes (Signed)
Patient ID: Teresa Crawford, female   DOB: 04-Dec-1985, 31 y.o.   MRN: [id number]  [facility] MD/PA/NP OP Progress Note  07/31/2016 10:34 AM [name]  MRN:  [id number]  Subjective:  Follow-up of her posttraumatic stress disorder and major depressive disorder, recurrent moderate. Reports doing well currently. Patient reports that she is feeling too sleepy on the Cymbalta at 40 mg. Would like to go back to 30 mg daily. Reports her mood has been quite good. She had her hysterectomy done in February and states the surgery and recovery went quite well. Denies any self-harm mom behavior since January and reports her mood has been quite stable.  Chief Complaint: Doing well Chief Complaint    Follow-up; Medication Refill     Visit Diagnosis:     ICD-9-CM ICD-10-CM   1. Major depressive disorder, recurrent episode, moderate (HCC) 296.32 F33.1   2. PTSD (post-traumatic stress disorder) 309.81 F43.10     Past Medical History:  Past Medical History:  Diagnosis Date  . Chronic back pain   . Chronic fatigue syndrome   . Depression   . Elevated liver enzymes   . Endometriosis   . Hepatic steatosis   . Hypothyroid   . Last menstrual period (LMP) > 10 days ago 09/12/15  . Mental retardation   . Migraine   . Posttraumatic stress disorder   . PUD (peptic ulcer disease)   . Scoliosis   . Splenomegaly   . Uterine fibroid     Past Surgical History:  Procedure Laterality Date  . ABDOMINAL HYSTERECTOMY    . BACK SURGERY    . COLONOSCOPY  04/27/2006   Wohl-colitis, internal hemorrhoids  . COLONOSCOPY  02/2013   focal right colitis, left colon biopsy negative, focal active proctitis without chronicity  . ESOPHAGOGASTRODUODENOSCOPY  02/2013   multiple peptic & duodenal ulcers, negative h pylori  . LAPAROSCOPIC ENDOMETRIOSIS FULGURATION  2015   [name]   Family History:  Family History  Problem Relation Age of Onset  . Breast cancer Mother 68  . COPD Father   . Lung cancer Father 3  . Dementia  Father   . Diabetes Sister   . Diabetes Brother   . Ovarian cancer Paternal Aunt   . Colon cancer Maternal Grandfather 70  . Liver disease Maternal Aunt     ?etiology  . Lymphoma Maternal Grandmother 60  . Lung cancer Other 53    maternal great grandfather  . Stomach cancer Other 60    maternal great grandmother   Social History:  Social History   Social History  . Marital status: Single    Spouse name: N/A  . Number of children: 0  . Years of education: N/A   Social History Main Topics  . Smoking status: Never Smoker  . Smokeless tobacco: Never Used  . Alcohol use No  . Drug use: No  . Sexual activity: Not Currently   Other Topics Concern  . None   Social History Narrative   She has brother, half brother & sister live nearby   Both parents deceased   Care managed by [name]   Additional History:   Assessment:   Musculoskeletal: Strength & Muscle Tone: within normal limits Gait & Station: normal Patient leans: N/A  Psychiatric Specialty Exam: Medication Refill   Depression         Associated symptoms include does not have insomnia and no suicidal ideas.   Review of Systems  Endo/Heme/Allergies:  Patient reports that have an ultrasound of her thyroid.  Psychiatric/Behavioral: Negative for depression, hallucinations, memory loss, substance abuse and suicidal ideas. The patient is not nervous/anxious and does not have insomnia.   All other systems reviewed and are negative.   Blood pressure 115/77, pulse 99, temperature 97.6 F (36.4 C), temperature source Oral, weight 193 lb (87.5 kg), last menstrual period 01/05/2016.Body mass index is 34.19 kg/m.  General Appearance: Fairly Groomed  Eye Contact:  Fair  Speech:  Clear and Coherent and Normal Rate  Volume:  Normal  Mood:  Good  Affect:  Congruent   Thought Process:  Concrete  Orientation:  Full (Time, Place, and Person)  Thought Content:  Negative  Suicidal Thoughts:  No  Homicidal  Thoughts:  No  Memory:  Immediate;   Good Recent;   Good Remote;   Good  Judgement:  Fair  Insight:  Fair  Psychomotor Activity:  Negative  Concentration:  Good  Recall:  Good  Fund of Knowledge: Fair  Language: Fair  Akathisia:  Negative  Handed:  Right unknown  AIMS (if indicated):  Not done  Assets:  Social Support  ADL's:  Intact  Cognition: Impaired,  Moderate  Sleep:  Good    Is the patient at risk to self?  No. Has the patient been a risk to self in the past 6 months?  No. Has the patient been a risk to self within the distant past?  No. Is the patient a risk to others?  No. Has the patient been a risk to others in the past 6 months?  No. Has the patient been a risk to others within the distant past?  No.  Current Medications: Current Outpatient Prescriptions  Medication Sig Dispense Refill  . amoxicillin-clavulanate (AUGMENTIN) 875-125 MG tablet Take 1 tablet by mouth 2 (two) times daily.    . Butalbital-APAP-Caffeine 50-300-40 MG CAPS Take by mouth.    . cetirizine (ZYRTEC) 10 MG tablet Take 10 mg by mouth daily.    . cyanocobalamin (,VITAMIN B-12,) 1000 MCG/ML injection Inject 1 mL into the muscle every 30 (thirty) days.    . diazepam (VALIUM) 5 MG tablet Insert one tablet into the vagina at bedtime    . docusate sodium (COLACE) 100 MG capsule Take 100 mg by mouth 2 (two) times daily.     . DULoxetine (CYMBALTA) 20 MG capsule Take 1 capsule (20 mg total) by mouth 2 (two) times daily. 60 capsule 1  . EPINEPHrine (EPIPEN 2-PAK) 0.3 mg/0.3 mL IJ SOAJ injection Inject into the muscle.    . fluticasone (FLONASE) 50 MCG/ACT nasal spray Place 1 spray into both nostrils daily.    . hydrOXYzine (VISTARIL) 50 MG capsule Take 50 mg by mouth.    . naproxen (NAPROSYN) 500 MG tablet Take 1 tablet (500 mg total) by mouth 2 (two) times daily with a meal. 20 tablet 2  . norethindrone (AYGESTIN) 5 MG tablet Take 5 mg by mouth daily.    Marland Kitchen omeprazole (PRILOSEC) 20 MG capsule Take 20 mg  by mouth 2 (two) times daily before a meal.     . SUMAtriptan (IMITREX) 50 MG tablet Take by mouth.    . topiramate (TOPAMAX) 50 MG tablet Take 25 mg by mouth.     . traMADol (ULTRAM) 50 MG tablet Take 100 mg by mouth every 6 (six) hours as needed. For pain.    . traZODone (DESYREL) 50 MG tablet Take 1 tablet (50 mg total) by mouth at bedtime. [place]  tablet 3   No current facility-administered medications for this visit.     Medical Decision Making:  Established Problem, Stable/Improving (1)  Treatment Plan Summary:Medication management   Post traumatic stress disorder- [place] Cymbalta to 30 mg daily  Patient educated about interactions with tramadol and Cymbalta. Patient reports that she takes the tramadol occasionally when she is in a lot of pain. She was made aware of serotonin syndrome with the Cymbalta and the tramadol.  Major depressive disorder, recurrent, moderate- Same as above  Insomnia- Continue to take Trazodone at 50mg  po qhs.  Patient is also on Valium prescribed by another doctor for pelvic muscle spasms.  Patient will return in  3 months. She has  been encouraged call with any questions or concerns prior to her next appointment.  Lessie Funderburke 07/31/2016, 10:34 AM

## 2016-08-18 DIAGNOSIS — R51 Headache: Secondary | ICD-10-CM | POA: Diagnosis not present

## 2016-08-18 DIAGNOSIS — H9209 Otalgia, unspecified ear: Secondary | ICD-10-CM | POA: Diagnosis not present

## 2016-08-18 DIAGNOSIS — H6121 Impacted cerumen, right ear: Secondary | ICD-10-CM | POA: Diagnosis not present

## 2016-08-24 ENCOUNTER — Ambulatory Visit: Payer: Medicare Other | Admitting: Psychiatry

## 2016-09-08 DIAGNOSIS — G40909 Epilepsy, unspecified, not intractable, without status epilepticus: Secondary | ICD-10-CM | POA: Diagnosis not present

## 2016-09-08 DIAGNOSIS — Z79899 Other long term (current) drug therapy: Secondary | ICD-10-CM | POA: Insufficient documentation

## 2016-09-08 DIAGNOSIS — F79 Unspecified intellectual disabilities: Secondary | ICD-10-CM | POA: Insufficient documentation

## 2016-09-08 DIAGNOSIS — E039 Hypothyroidism, unspecified: Secondary | ICD-10-CM | POA: Insufficient documentation

## 2016-09-08 DIAGNOSIS — G43809 Other migraine, not intractable, without status migrainosus: Secondary | ICD-10-CM | POA: Diagnosis not present

## 2016-09-08 DIAGNOSIS — R51 Headache: Secondary | ICD-10-CM | POA: Diagnosis not present

## 2016-09-08 NOTE — ED Triage Notes (Signed)
Pt in with co headache since April states not improving has not seen pmd for the same. Pt also states had a seizure 4 days ago but did not see pmd at that time, states has hx of seizures but not supposed to be on meds.

## 2016-09-08 NOTE — ED Notes (Signed)
Pt brought in by [facility] with co headache since April and now has loss of memory. Pt told fire dept that she has hx of seizures.

## 2016-09-09 ENCOUNTER — Emergency Department
Admission: EM | Admit: 2016-09-09 | Discharge: 2016-09-09 | Disposition: A | Discharge status: Home / Self Care | Payer: Medicare Other | Attending: Emergency Medicine | Admitting: Emergency Medicine

## 2016-09-09 ENCOUNTER — Emergency Department: Payer: Medicare Other

## 2016-09-09 DIAGNOSIS — G43809 Other migraine, not intractable, without status migrainosus: Secondary | ICD-10-CM

## 2016-09-09 DIAGNOSIS — G40909 Epilepsy, unspecified, not intractable, without status epilepticus: Secondary | ICD-10-CM | POA: Diagnosis not present

## 2016-09-09 DIAGNOSIS — R51 Headache: Secondary | ICD-10-CM | POA: Diagnosis not present

## 2016-09-09 LAB — CBC WITH DIFFERENTIAL/PLATELET
BASOS ABS: 0 10*3/uL (ref 0–0.1)
Basophils Relative: 1 %
Eosinophils Absolute: 0.1 10*3/uL (ref 0–0.7)
Eosinophils Relative: 1 %
HEMATOCRIT: 34.2 % — AB (ref 35.0–47.0)
Hemoglobin: 11.3 g/dL — ABNORMAL LOW (ref 12.0–16.0)
LYMPHS PCT: 37 %
Lymphs Abs: 2.4 10*3/uL (ref 1.0–3.6)
MCH: 23.3 pg — ABNORMAL LOW (ref 26.0–34.0)
MCHC: 33 g/dL (ref 32.0–36.0)
MCV: 70.8 fL — AB (ref 80.0–100.0)
Monocytes Absolute: 0.4 10*3/uL (ref 0.2–0.9)
Monocytes Relative: 6 %
NEUTROS ABS: 3.7 10*3/uL (ref 1.4–6.5)
Neutrophils Relative %: 55 %
PLATELETS: 260 10*3/uL (ref 150–440)
RBC: 4.83 MIL/uL (ref 3.80–5.20)
RDW: 16.4 % — ABNORMAL HIGH (ref 11.5–14.5)
WBC: 6.6 10*3/uL (ref 3.6–11.0)

## 2016-09-09 LAB — BASIC METABOLIC PANEL
ANION GAP: 7 (ref 5–15)
BUN: 7 mg/dL (ref 6–20)
CO2: 23 mmol/L (ref 22–32)
Calcium: 8.7 mg/dL — ABNORMAL LOW (ref 8.9–10.3)
Chloride: 109 mmol/L (ref 101–111)
Creatinine, Ser: 0.74 mg/dL (ref 0.44–1.00)
GFR calc Af Amer: >60 mL/min (ref >60)
GLUCOSE: 90 mg/dL (ref 65–99)
POTASSIUM: 3.6 mmol/L (ref 3.5–5.1)
Sodium: 139 mmol/L (ref 135–145)

## 2016-09-09 LAB — HEPATIC FUNCTION PANEL
ALBUMIN: 3.7 g/dL (ref 3.5–5.0)
ALT: 15 U/L (ref 14–54)
AST: 19 U/L (ref 15–41)
Alkaline Phosphatase: 95 U/L (ref 38–126)
BILIRUBIN TOTAL: 0.3 mg/dL (ref 0.3–1.2)
Bilirubin, Direct: <0.1 mg/dL — ABNORMAL LOW (ref 0.1–0.5)
TOTAL PROTEIN: 7.1 g/dL (ref 6.5–8.1)

## 2016-09-09 LAB — LIPASE, BLOOD: LIPASE: 31 U/L (ref 11–51)

## 2016-09-09 LAB — ETHANOL

## 2016-09-09 MED ORDER — DEXTROSE-NACL 5-0.45 % IV SOLN
INTRAVENOUS | Status: DC
Start: 2016-09-09 — End: 2016-09-09
  Administered 2016-09-09: 02:00:00 via INTRAVENOUS

## 2016-09-09 MED ORDER — PROCHLORPERAZINE MALEATE 10 MG PO TABS: 10.0000 mg | ORAL_TABLET | Freq: Four times a day (QID) | ORAL | 0 refills | Status: DC | PRN

## 2016-09-09 MED ORDER — KETOROLAC TROMETHAMINE 30 MG/ML IJ SOLN
10.0000 mg | Freq: Once | INTRAMUSCULAR | Status: AC
  Administered 2016-09-09: 9.9 mg via INTRAVENOUS
  Filled 2016-09-09: qty 1

## 2016-09-09 MED ORDER — PROCHLORPERAZINE EDISYLATE 5 MG/ML IJ SOLN
5.0000 mg | Freq: Once | INTRAMUSCULAR | Status: AC
  Administered 2016-09-09: 5 mg via INTRAVENOUS
  Filled 2016-09-09: qty 2

## 2016-09-09 MED ORDER — DIPHENHYDRAMINE HCL 50 MG/ML IJ SOLN
25.0000 mg | Freq: Once | INTRAMUSCULAR | Status: AC
  Administered 2016-09-09: 25 mg via INTRAVENOUS
  Filled 2016-09-09: qty 1

## 2016-09-09 NOTE — Discharge Instructions (Signed)
You may take Compazine as needed for headache and nausea. Return to the ER for worsening symptoms, persistent vomiting, seizure activity, lethargy or other concerns.

## 2016-09-09 NOTE — ED Provider Notes (Signed)
[facility] Emergency Department Provider Note   ____________________________________________   First MD Initiated Contact with Patient 09/09/16 0145     (approximate)  I have reviewed the triage vital signs and the nursing notes.   HISTORY  Chief Complaint Headache    HPI Teresa Crawford is a 31 y.o. female who presents to the ED from home with a chief complaint of headache and seizure. Patient has a history of migraine headaches; reports a constant headache since April. Symptoms associated with photophobia. Takes Topamax and states she has had no relief with Topamax.Also reports history of seizures; last seizure 4 years ago and patient has not been taking antiepileptics. States she had a seizure in her sleep 4 days ago. This was unwitnessed. Denies recent fever, chills, neck pain, chest pain, shortness of breath, abdominal pain, nausea, vomiting, diarrhea. Denies recent travel or trauma. Denies recent tick bite. Nothing makes her symptoms better or worse.   Past Medical History:  Diagnosis Date  . Chronic back pain   . Chronic fatigue syndrome   . Depression   . Elevated liver enzymes   . Endometriosis   . Hepatic steatosis   . Hypothyroid   . Last menstrual period (LMP) > 10 days ago 09/12/15  . Mental retardation   . Migraine   . Posttraumatic stress disorder   . PUD (peptic ulcer disease)   . Scoliosis   . Splenomegaly   . Uterine fibroid     Patient Active Problem List   Diagnosis Date Noted  . Self-inflicted laceration of wrist 04/07/2016  . Major depressive disorder, recurrent episode, moderate ([place]) 10/29/2014  . PTSD (post-traumatic stress disorder) 10/29/2014  . Grief 10/29/2014  . CFIDS (chronic fatigue and immune dysfunction syndrome) ([place]) 09/28/2014  . Aggrieved 09/28/2014  . Depression, major, recurrent ([place]) 09/28/2014  . Intellectual disability 09/28/2014  . Neurosis, posttraumatic 09/28/2014  . History of colitis  09/22/2014  . Hepatic steatosis 09/20/2014  . Endometriosis 08/30/2013    Past Surgical History:  Procedure Laterality Date  . ABDOMINAL HYSTERECTOMY    . BACK SURGERY    . COLONOSCOPY  04/27/2006   Wohl-colitis, internal hemorrhoids  . COLONOSCOPY  02/2013   focal right colitis, left colon biopsy negative, focal active proctitis without chronicity  . ESOPHAGOGASTRODUODENOSCOPY  02/2013   multiple peptic & duodenal ulcers, negative h pylori  . LAPAROSCOPIC ENDOMETRIOSIS FULGURATION  2015   [name]    Prior to Admission medications   Medication Sig Start Date End Date Taking? Authorizing Provider  amoxicillin-clavulanate (AUGMENTIN) 875-125 MG tablet Take 1 tablet by mouth 2 (two) times daily.    [provider]  Butalbital-APAP-Caffeine 50-300-40 MG CAPS Take by mouth.    [provider]  cetirizine (ZYRTEC) 10 MG tablet Take 10 mg by mouth daily.    [provider]  cyanocobalamin (,VITAMIN B-12,) 1000 MCG/ML injection Inject 1 mL into the muscle every 30 (thirty) days.    [provider]  diazepam (VALIUM) 5 MG tablet Insert one tablet into the vagina at bedtime 11/26/14   [provider]  docusate sodium (COLACE) 100 MG capsule Take 100 mg by mouth 2 (two) times daily.     [provider]  DULoxetine (CYMBALTA) 30 MG capsule Take 1 capsule (30 mg total) by mouth daily. 07/31/16   Ravi, Himabindu, MD  EPINEPHrine (EPIPEN 2-PAK) 0.3 mg/0.3 mL IJ SOAJ injection Inject into the muscle.    [provider]  fluticasone (FLONASE) 50 MCG/ACT nasal  spray Place 1 spray into both nostrils daily.    [provider]  hydrOXYzine (VISTARIL) 50 MG capsule Take 50 mg by mouth. 06/29/14   [provider]  naproxen (NAPROSYN) 500 MG tablet Take 1 tablet (500 mg total) by mouth 2 (two) times daily with a meal. 04/27/16   Lavonia Drafts, MD  norethindrone (AYGESTIN) 5 MG tablet Take 5 mg by mouth daily.    [provider]  omeprazole (PRILOSEC) 20 MG capsule Take 20 mg by mouth 2 (two) times daily before a meal.     [provider]  prochlorperazine (COMPAZINE) 10 MG tablet Take 1 tablet (10 mg total) by mouth every 6 (six) hours as needed for nausea. 09/09/16   Paulette Blanch, MD  SUMAtriptan (IMITREX) 50 MG tablet Take by mouth.    [provider]  topiramate (TOPAMAX) 50 MG tablet Take 25 mg by mouth.     [provider]  traMADol (ULTRAM) 50 MG tablet Take 100 mg by mouth every 6 (six) hours as needed. For pain.    [provider]  traZODone (DESYREL) 50 MG tablet Take 1 tablet (50 mg total) by mouth at bedtime. 07/31/16   Elvin So, MD    Allergies Peanut butter flavor; Codeine; Paroxetine; Paxil [paroxetine hcl]; Venlafaxine; and Zoloft [sertraline hcl]  Family History  Problem Relation Age of Onset  . Breast cancer Mother 53  . COPD Father   . Lung cancer Father 100  . Dementia Father   . Diabetes Sister   . Diabetes Brother   . Ovarian cancer Paternal Aunt   . Colon cancer Maternal Grandfather 45  . Liver disease Maternal Aunt        ?etiology  . Lymphoma Maternal Grandmother 60  . Lung cancer Other 75       maternal great grandfather  . Stomach cancer Other 70       maternal great grandmother  Migraine headaches None for cerebral aneurysm  Social History Social History  Substance Use Topics  . Smoking status: Never Smoker  . Smokeless tobacco: Never Used  . Alcohol use No    Review of Systems  Constitutional: No fever/chills. Eyes: No visual changes. ENT: No sore throat. Cardiovascular: Denies chest pain. Respiratory: Denies shortness of breath. Gastrointestinal: No abdominal pain.  No nausea, no vomiting.  No diarrhea.  No constipation. Genitourinary: Negative for dysuria. Musculoskeletal: Negative for back pain. Skin: Negative for rash. Neurological: Positive for headache. Negative for focal weakness or  numbness.   ____________________________________________   PHYSICAL EXAM:  VITAL SIGNS: ED Triage Vitals  Enc Vitals Group     BP 09/08/16 2142 113/64     Pulse Rate 09/08/16 2142 94     Resp 09/08/16 2142 18     Temp 09/08/16 2142 98.3 F (36.8 C)     Temp Source 09/08/16 2142 Oral     SpO2 09/08/16 2142 100 %     Weight 09/08/16 2142 200 lb (90.7 kg)     Height 09/08/16 2142 5\' 5"  (1.651 m)     Head Circumference --      Peak Flow --      Pain Score 09/08/16 2145 10     Pain Loc --      Pain Edu? --      Excl. in [place]? --     Constitutional: Alert and oriented. Well appearing and in no acute distress. Eyes: Conjunctivae are normal. PERRL. EOMI. Head: Atraumatic. Nose: No congestion/rhinnorhea.  Mouth/Throat: Mucous membranes are moist.  Oropharynx non-erythematous. Neck: No stridor.  Supple neck without meningismus. No carotid bruits. Cardiovascular: Normal rate, regular rhythm. Grossly normal heart sounds.  Good peripheral circulation. Respiratory: Normal respiratory effort.  No retractions. Lungs CTAB. Gastrointestinal: Soft and nontender. No distention. No abdominal bruits. No CVA tenderness. Musculoskeletal: No lower extremity tenderness nor edema.  No joint effusions. Neurologic:  Normal speech and language. No gross focal neurologic deficits are appreciated. No gait instability. Skin:  Skin is warm, dry and intact. No rash noted. No petechiae. Psychiatric: Mood and affect are normal. Speech and behavior are normal.  ____________________________________________   LABS (all labs ordered are listed, but only abnormal results are displayed)  Labs Reviewed  CBC WITH DIFFERENTIAL/PLATELET - Abnormal; Notable for the following:       Result Value   Hemoglobin 11.3 (*)    HCT 34.2 (*)    MCV 70.8 (*)    MCH 23.3 (*)    RDW 16.4 (*)    All other components within normal limits  BASIC METABOLIC PANEL - Abnormal; Notable for the following:    Calcium 8.7 (*)    All  other components within normal limits  HEPATIC FUNCTION PANEL - Abnormal; Notable for the following:    Bilirubin, Direct <0.1 (*)    All other components within normal limits  LIPASE, BLOOD  ETHANOL  URINALYSIS, COMPLETE (UACMP) WITH MICROSCOPIC  URINE DRUG SCREEN, QUALITATIVE ([facility] ONLY)  POC URINE PREG, ED   ____________________________________________  EKG  None ____________________________________________  RADIOLOGY  Ct Head Wo Contrast  Result Date: 09/09/2016 CLINICAL DATA:  Headache since April (least 2 months). Seizure 4 days prior. Patient reports history of seizures. EXAM: CT HEAD WITHOUT CONTRAST TECHNIQUE: Contiguous axial images were obtained from the base of the skull through the vertex without intravenous contrast. COMPARISON:  Head CT 02/25/2014 FINDINGS: Brain: No evidence of acute infarction, hemorrhage, hydrocephalus, extra-axial collection or mass lesion/mass effect. Vascular: No hyperdense vessel or unexpected calcification. Skull: Normal. Negative for fracture or focal lesion. Sinuses/Orbits: Paranasal sinuses and mastoid air cells are clear. The visualized orbits are unremarkable. Other: None. IMPRESSION: No acute intracranial abnormality. No explanation for headache or seizures. Electronically Signed   By: Jeb Levering M.D.   On: 09/09/2016 03:26    ____________________________________________   PROCEDURES  Procedure(s) performed: None  Procedures  Critical Care performed: No  ____________________________________________   INITIAL IMPRESSION / ASSESSMENT AND PLAN / ED COURSE  Pertinent labs & imaging results that were available during my care of the patient were reviewed by me and considered in my medical decision making (see chart for details).  31 year old female with a history of migraine headaches and seizures who presents with constant headache 2 months and seizure in her sleep 4 days ago which was unwitnessed. Will obtain screening lab  work, obtain CT head. Infuse headache cocktail of Toradol, Compazine and Benadryl; will reassess. Patient's neck is supple and there are no focal neurological deficits on my examination.  Clinical Course as of Sep 10 335  Wed Sep 09, 2016  0332 Headache is completely resolved. Updated patient and spouse on laboratory and imaging results. Based on patient's description, it is unclear whether or not she had a true tonic-clonic seizure as this happened in her sleep and there were no witnesses. This is not convincing to restart antiepileptics. We'll discharge patient home with prescription for Compazine, and she will follow-up with her doctor closely. Strict return precautions given. Patient verbalizes understanding and agrees  with plan of care.  [JS]    Clinical Course User Index [JS] Paulette Blanch, MD     ____________________________________________   FINAL CLINICAL IMPRESSION(S) / ED DIAGNOSES  Final diagnoses:  Other migraine without status migrainosus, not intractable      NEW MEDICATIONS STARTED DURING THIS VISIT:  New Prescriptions   PROCHLORPERAZINE (COMPAZINE) 10 MG TABLET    Take 1 tablet (10 mg total) by mouth every 6 (six) hours as needed for nausea.     Note:  This document was prepared using Dragon voice recognition software and may include unintentional dictation errors.    Paulette Blanch, MD 09/09/16 [phone]

## 2016-09-09 NOTE — ED Notes (Signed)
Pt discharged to home.  Family member driving.  Discharge instructions reviewed.  Verbalized understanding.  No questions or concerns at this time.  Teach back verified.  Pt in NAD.  No items left in ED.   

## 2016-09-10 DIAGNOSIS — R531 Weakness: Secondary | ICD-10-CM | POA: Diagnosis not present

## 2016-09-10 DIAGNOSIS — G40909 Epilepsy, unspecified, not intractable, without status epilepticus: Secondary | ICD-10-CM | POA: Diagnosis not present

## 2016-09-10 DIAGNOSIS — G43019 Migraine without aura, intractable, without status migrainosus: Secondary | ICD-10-CM | POA: Diagnosis not present

## 2016-10-01 DIAGNOSIS — R635 Abnormal weight gain: Secondary | ICD-10-CM | POA: Diagnosis not present

## 2016-10-01 DIAGNOSIS — G4489 Other headache syndrome: Secondary | ICD-10-CM | POA: Diagnosis not present

## 2016-10-15 ENCOUNTER — Encounter: Payer: Self-pay | Admitting: Psychiatry

## 2016-10-15 ENCOUNTER — Ambulatory Visit (INDEPENDENT_AMBULATORY_CARE_PROVIDER_SITE_OTHER): Payer: Medicare Other | Admitting: Psychiatry

## 2016-10-15 VITALS — BP 125/87 | HR 81 | Temp 97.4°F | Wt 203.8 lb

## 2016-10-15 DIAGNOSIS — F79 Unspecified intellectual disabilities: Secondary | ICD-10-CM

## 2016-10-15 DIAGNOSIS — F331 Major depressive disorder, recurrent, moderate: Secondary | ICD-10-CM | POA: Diagnosis not present

## 2016-10-15 DIAGNOSIS — F4310 Post-traumatic stress disorder, unspecified: Secondary | ICD-10-CM | POA: Diagnosis not present

## 2016-10-15 MED ORDER — DULOXETINE HCL 30 MG PO CPEP: 30.0000 mg | ORAL_CAPSULE | Freq: Every day | ORAL | 2 refills | Status: DC

## 2016-10-15 MED ORDER — TRAZODONE HCL 50 MG PO TABS: 50.0000 mg | ORAL_TABLET | Freq: Every day | ORAL | 3 refills | Status: DC

## 2016-10-15 NOTE — Progress Notes (Signed)
Patient ID: Teresa Crawford, female   DOB: [birth year], 31 y.o.   MRN: [id number]  [facility] MD/PA/NP OP Progress Note  10/15/2016 10:48 AM FREDDIE NGHIEM  MRN:  [id number]  Subjective:  Follow-up of her posttraumatic stress disorder and major depressive disorder, recurrent moderate. Reports doing well currently. Patient was seen with her group home worker. Reports that the Cymbalta at 30 mg has been good. He reports good mood and stability. She is looking forward to starting classes soon and wants to be a nurse. She has gained some weight since her last appointment and states that she is also trying to lose weight. Overall no complaints and doing quite well   Chief Complaint: Doing well Chief Complaint    Follow-up; Medication Refill     Visit Diagnosis:     ICD-10-CM   1. Major depressive disorder, recurrent episode, moderate (HCC) F33.1   2. PTSD (post-traumatic stress disorder) F43.10   3. Intellectual disability F47     Past Medical History:  Past Medical History:  Diagnosis Date  . Chronic back pain   . Chronic fatigue syndrome   . Depression   . Elevated liver enzymes   . Endometriosis   . Hepatic steatosis   . Hypothyroid   . Last menstrual period (LMP) > 10 days ago 09/12/15  . Mental retardation   . Migraine   . Posttraumatic stress disorder   . PUD (peptic ulcer disease)   . Scoliosis   . Splenomegaly   . Uterine fibroid     Past Surgical History:  Procedure Laterality Date  . ABDOMINAL HYSTERECTOMY    . BACK SURGERY    . COLONOSCOPY  04/27/2006   Wohl-colitis, internal hemorrhoids  . COLONOSCOPY  02/2013   focal right colitis, left colon biopsy negative, focal active proctitis without chronicity  . ESOPHAGOGASTRODUODENOSCOPY  02/2013   multiple peptic & duodenal ulcers, negative h pylori  . LAPAROSCOPIC ENDOMETRIOSIS FULGURATION  2015   [name]   Family History:  Family History  Problem Relation Age of Onset  . Breast cancer Mother 34  . COPD Father   . Lung  cancer Father 52  . Dementia Father   . Diabetes Sister   . Diabetes Brother   . Ovarian cancer Paternal Aunt   . Colon cancer Maternal Grandfather 54  . Liver disease Maternal Aunt        ?etiology  . Lymphoma Maternal Grandmother 60  . Lung cancer Other 86       maternal great grandfather  . Stomach cancer Other 41       maternal great grandmother   Social History:  Social History   Social History  . Marital status: Single    Spouse name: N/A  . Number of children: 0  . Years of education: N/A   Social History Main Topics  . Smoking status: Never Smoker  . Smokeless tobacco: Never Used  . Alcohol use No  . Drug use: No  . Sexual activity: Not Currently   Other Topics Concern  . None   Social History Narrative   She has brother, half brother & sister live nearby   Both parents deceased   Care managed by [name]   Additional History:   Assessment:   Musculoskeletal: Strength & Muscle Tone: within normal limits Gait & Station: normal Patient leans: N/A  Psychiatric Specialty Exam: Medication Refill   Depression         Associated symptoms include does not have insomnia and  no suicidal ideas.   Review of Systems  Endo/Heme/Allergies:       Patient reports that have an ultrasound of her thyroid.  Psychiatric/Behavioral: Negative for depression, hallucinations, memory loss, substance abuse and suicidal ideas. The patient is not nervous/anxious and does not have insomnia.   All other systems reviewed and are negative.   Blood pressure 125/87, pulse 81, temperature (!) 97.4 F (36.3 C), temperature source Oral, weight 203 lb 12.8 oz (92.4 kg), last menstrual period 01/05/2016.Body mass index is 33.91 kg/m.  General Appearance: Fairly Groomed  Eye Contact:  Fair  Speech:  Clear and Coherent and Normal Rate  Volume:  Normal  Mood:  Good  Affect:  Congruent   Thought Process:  Concrete  Orientation:  Full (Time, Place, and Person)  Thought Content:   Negative  Suicidal Thoughts:  No  Homicidal Thoughts:  No  Memory:  Immediate;   Good Recent;   Good Remote;   Good  Judgement:  Fair  Insight:  Fair  Psychomotor Activity:  Negative  Concentration:  Good  Recall:  Good  Fund of Knowledge: Fair  Language: Fair  Akathisia:  Negative  Handed:  Right unknown  AIMS (if indicated):  Not done  Assets:  Social Support  ADL's:  Intact  Cognition: Impaired,  Moderate  Sleep:  Good    Is the patient at risk to self?  No. Has the patient been a risk to self in the past 6 months?  No. Has the patient been a risk to self within the distant past?  No. Is the patient a risk to others?  No. Has the patient been a risk to others in the past 6 months?  No. Has the patient been a risk to others within the distant past?  No.  Current Medications: Current Outpatient Prescriptions  Medication Sig Dispense Refill  . amoxicillin-clavulanate (AUGMENTIN) 875-125 MG tablet Take 1 tablet by mouth 2 (two) times daily.    . Butalbital-APAP-Caffeine 50-300-40 MG CAPS Take by mouth.    . cetirizine (ZYRTEC) 10 MG tablet Take 10 mg by mouth daily.    . cyanocobalamin (,VITAMIN B-12,) 1000 MCG/ML injection Inject 1 mL into the muscle every 30 (thirty) days.    . diazepam (VALIUM) 5 MG tablet Insert one tablet into the vagina at bedtime    . docusate sodium (COLACE) 100 MG capsule Take 100 mg by mouth 2 (two) times daily.     . DULoxetine (CYMBALTA) 30 MG capsule Take 1 capsule (30 mg total) by mouth daily. 30 capsule 2  . EPINEPHrine (EPIPEN 2-PAK) 0.3 mg/0.3 mL IJ SOAJ injection Inject into the muscle.    . fluticasone (FLONASE) 50 MCG/ACT nasal spray Place 1 spray into both nostrils daily.    . hydrOXYzine (VISTARIL) 50 MG capsule Take 50 mg by mouth.    . naproxen (NAPROSYN) 500 MG tablet Take 1 tablet (500 mg total) by mouth 2 (two) times daily with a meal. 20 tablet 2  . norethindrone (AYGESTIN) 5 MG tablet Take 5 mg by mouth daily.    Marland Kitchen omeprazole  (PRILOSEC) 20 MG capsule Take 20 mg by mouth 2 (two) times daily before a meal.     . prochlorperazine (COMPAZINE) 10 MG tablet Take 1 tablet (10 mg total) by mouth every 6 (six) hours as needed for nausea. 20 tablet 0  . SUMAtriptan (IMITREX) 50 MG tablet Take by mouth.    . topiramate (TOPAMAX) 50 MG tablet Take 25 mg by mouth.     Marland Kitchen  traMADol (ULTRAM) 50 MG tablet Take 100 mg by mouth every 6 (six) hours as needed. For pain.    . traZODone (DESYREL) 50 MG tablet Take 1 tablet (50 mg total) by mouth at bedtime. 30 tablet 3   No current facility-administered medications for this visit.     Medical Decision Making:  Established Problem, Stable/Improving (1)  Treatment Plan Summary:Medication management   Post traumatic stress disorder- Continue Cymbalta to 30 mg daily   Major depressive disorder, recurrent, moderate- Same as above  Insomnia- Continue to take Trazodone at 50mg  po qhs.  Patient is also on Valium prescribed by another doctor for pelvic muscle spasms.  Patient will return in  3 months. She has  been encouraged call with any questions or concerns prior to her next appointment.  Rox Mcgriff 10/15/2016, 10:48 AM

## 2016-10-20 ENCOUNTER — Encounter: Payer: Self-pay | Admitting: Dietician

## 2016-10-20 ENCOUNTER — Encounter: Payer: Medicare Other | Attending: Internal Medicine | Admitting: Dietician

## 2016-10-20 VITALS — Ht 65.5 in | Wt 202.0 lb

## 2016-10-20 DIAGNOSIS — E6609 Other obesity due to excess calories: Secondary | ICD-10-CM

## 2016-10-20 DIAGNOSIS — Z713 Dietary counseling and surveillance: Secondary | ICD-10-CM | POA: Insufficient documentation

## 2016-10-20 DIAGNOSIS — E669 Obesity, unspecified: Secondary | ICD-10-CM | POA: Diagnosis not present

## 2016-10-20 DIAGNOSIS — F329 Major depressive disorder, single episode, unspecified: Secondary | ICD-10-CM | POA: Insufficient documentation

## 2016-10-20 DIAGNOSIS — Z6831 Body mass index (BMI) 31.0-31.9, adult: Secondary | ICD-10-CM

## 2016-10-20 NOTE — Progress Notes (Signed)
Medical Nutrition Therapy: Visit start time: 10:40am A  end time: 12:10pm Assessment:  Diagnosis: obesity, major depressive disorder Past medical history: chronic fatigue, intellectual disability, PTSD, fatty liver; also has a peanut allergy and has also been told to avoid tree nuts. Psychosocial issues/ stress concerns:Patient denies being depressed or hopeless at today's visit.  Preferred learning method:  . Auditory Current weight: 202 lbs Height: 65.5 in Medications, supplements: see list Progress and evaluation:  Patient accompanied by her Idamae Schuller coach, Inez Catalina came for initial medical nutrition therapy visit. Patient lives alone and has a Leisure centre manager for 3 hours daily who can accompany her to the grocery store and help with meal planning and preparation. Patient reports that she often skips meals during the day and eats "alot" in the evening usually sweets. She states she can eat 2 sleeves of sugar cookies with icing for example. She eats "out" frequently accompanied by her boyfriend and usually makes high fat choices at fast food restaurants. She admits to being a very picky eater but states she is willing to try more fruits and vegetables. On most days, her diet is void of fruits and vegetables. She doesn't eat meat often at home but includes when eating "out" which meets her protein needs on those days. She has gained approximately 30 lbs in the past 9 months.   Physical activity: none; she has access with no charge to the [facility] and states she is willing for staff to accompany her several days per week. Dietary Intake:  Usual eating pattern includes 1-2 meals and numerous snacks in the evening Dining out frequency: 6-8 meals per week.  Breakfast: often skips but does eat cereal/milk or oatmeal on some days Lunch:Cup of noodles soup or skips or eats "out" with her boyfriend; often a [employer] baconator, fries and soda. Snack: cookies Supper: "out" or sometimes skips and just eats  "sweets". Snack: cookies Beverages: diet green tea, water, soda when "out"; wine coolers several times per year  Nutrition Care Education: Basic nutrition:   Used food guide plate to teach the food groups with examples of foods patient will eat and can be available. Showed how to better balance meals with fruits and vegetables. Gave and reviewed "Quick and Healthy Meals" for examples of balanced meals and asked if coaches could help her both with the foods she purchases and with simple preparation. Inez Catalina stated that coaches can help with this. Looked up calories in [place] and compared to a cheeseburger so patient could see the difference. Also, discussed other healthier choices when eating "out".  Stressed need for 3 meals daily spaced 4-5 hours apart to help decrease excessive snacking in the evenings. Discussed strategies such as purchasing a single 100-120 calorie pack of cookies to avoid binge eating on cookies. Encouraged to avoid any alcohol to avoid medication/ alcohol interaction. Exercise:  Discussed exercise options. She stated when she [facility] the [facility], she liked riding stationary bike.  Nutritional Diagnosis:   Inactivity as related to lack of any structure exercise as evidenced by physical activity history. Excessive caloric intake as related to excessive intake of sweets and frequent high fat fast food choices as evidenced by diet history and BMI of 33.1. Multi-nutrient deficiency as related to significantly low intake of fruits, vegetables, whole grains and calcium sources as evidenced by diet history. Intervention: [name] coaches to help with these recommendations: Use food guide plate to help you balance protein and  2-3 starch servings. Add a vegetable or fruit  or both with as many meals as possible. Limit fruit juice to no more than 8 oz per day. Choose a cheeseburger when "out" rather than baconator. Buy more fruits and vegetables to add as sides in order to  decrease starch and higher fat main dishes. Include at least 3 food groups per meal. Measure out some portions of starchy foods. Consider buying some measuring cups at a [place] for example. Can call Hospice and Palliative care at [phone] and ask about Advanced Directives. Exercise at "[facility]' 3 days per week for 30 minutes.  Education Materials given:  . Plate Planner . Quick and Healthy Meals . Food lists/ Planning A Balanced Meal . Snacking handout . Goals/ instructions Learner/ who was taught:  . Patient  . [place]  Level of understanding: . artial understanding; needs review/ practice Demonstrated degree of understanding via:   Teach back Learning barriers: . Cognitive limitations  Willingness to learn/ readiness for change: . Acceptance, ready for change  Monitoring and Evaluation:  Dietary intake, exercise, , and body weight      follow up: 11/19/16 at 10:30am

## 2016-10-20 NOTE — Patient Instructions (Addendum)
[name] coaches to help with these recommendations: Use food guide plate to help you balance protein and  2-3 starch servings. Add a vegetable or fruit or both with as many meals as possible. Limit fruit juice to no more than 8 oz per day. Choose a cheeseburger when "out" rather than baconator. Buy more fruits and vegetables to add as sides in order to decrease starch and higher fat main dishes. Include at least 3 food groups per meal. Measure out some portions of starchy foods. Consider buying some measuring cups at a [place] for example. Can call Hospice and Palliative care at [phone] and ask about Advanced Directives. Exercise at "[facility]' 3 days per week for 30 minutes.

## 2016-10-22 DIAGNOSIS — R102 Pelvic and perineal pain: Secondary | ICD-10-CM | POA: Diagnosis not present

## 2016-10-22 DIAGNOSIS — M791 Myalgia: Secondary | ICD-10-CM | POA: Diagnosis not present

## 2016-10-23 ENCOUNTER — Other Ambulatory Visit: Payer: Self-pay | Admitting: Obstetrics & Gynecology

## 2016-10-27 ENCOUNTER — Other Ambulatory Visit: Payer: Self-pay | Admitting: Obstetrics & Gynecology

## 2016-10-27 DIAGNOSIS — R102 Pelvic and perineal pain: Secondary | ICD-10-CM

## 2016-11-03 ENCOUNTER — Ambulatory Visit
Admission: RE | Admit: 2016-11-03 | Discharge: 2016-11-03 | Disposition: A | Discharge status: Home / Self Care | Payer: Medicare Other | Source: Ambulatory Visit | Attending: Obstetrics & Gynecology | Admitting: Obstetrics & Gynecology

## 2016-11-03 DIAGNOSIS — R102 Pelvic and perineal pain: Secondary | ICD-10-CM

## 2016-11-03 DIAGNOSIS — Z90710 Acquired absence of both cervix and uterus: Secondary | ICD-10-CM | POA: Insufficient documentation

## 2016-11-19 ENCOUNTER — Ambulatory Visit: Payer: Self-pay | Admitting: Dietician

## 2016-11-19 ENCOUNTER — Telehealth: Payer: Self-pay | Admitting: Dietician

## 2016-11-19 NOTE — Telephone Encounter (Signed)
Vaughan Basta, patient's "coach", asked that I call back next week to reschedule patient's missed appointment.

## 2016-11-26 ENCOUNTER — Encounter: Payer: Medicare Other | Attending: Internal Medicine | Admitting: Dietician

## 2016-11-26 VITALS — Ht 65.5 in | Wt 207.2 lb

## 2016-11-26 DIAGNOSIS — E669 Obesity, unspecified: Secondary | ICD-10-CM | POA: Insufficient documentation

## 2016-11-26 DIAGNOSIS — F329 Major depressive disorder, single episode, unspecified: Secondary | ICD-10-CM | POA: Diagnosis not present

## 2016-11-26 DIAGNOSIS — E6609 Other obesity due to excess calories: Secondary | ICD-10-CM

## 2016-11-26 DIAGNOSIS — Z713 Dietary counseling and surveillance: Secondary | ICD-10-CM | POA: Insufficient documentation

## 2016-11-26 DIAGNOSIS — Z6831 Body mass index (BMI) 31.0-31.9, adult: Secondary | ICD-10-CM

## 2016-11-26 NOTE — Progress Notes (Signed)
Medical Nutrition Therapy Follow-up visit:  Time with patient: 9-10:00am Visit #:2 ASSESSMENT:  Diagnosis:obesity, major depressive disorder  Current weight:207.2 lbs    Height:65.5in Medications: See list Medical History: chronic fatigue, intellectual disability, fatty liver; also has a peanut allergy Progress and evaluation: Patient, accompanied by her "coach", Inez Catalina, came for  medical nutrition therapy follow-up. She gave several examples of meals that she has added fruits or vegetables or both. She continues to skip breakfast on most days. She did switch from a Technical sales engineer to a cheeseburger as suggested. She also chose a [place] burger and green beans as an example of a meal eaten "out". She admits that evening snacking continues to be a problem area. She has not gone to the [facility] for exercise although staff is willing to take her and she isn't charged. She set this again as a goal. She expressed surprise that she has gained weight; 5.2 lbs since her previous visit 5 weeks ago.   NUTRITION CARE EDUCATION: Basic nutrition: Commended patient on positive diet changes she has made. Gave an actual plate divided into starch, protein, fruit/vegetable sections. Asked her to take examples of her meal choices and show where on the plate the foods would be placed. She did a good job of putting food choices in appropriate sections.  For one meal of a roll, macaroni/cheese, potato casserole, mashed potatoes, she said, "Oh, All of my choices were starches". We discussed other options she could have chosen to balance her meals better. Used food models to discuss portion control and encouraged again to purchase some measuring cups to help with this. Gave the plate to take home to help her remember the need to balance starch and protein with fruits/ vegetables and to use only 1/4 of plate for starch choices. Exercise: Discussed again the importance of physical activity and how the [employer] option could be a great  choice. She agreed that she could go at least 2 days per week. Discussed additional options such as dancing since [employer] loves listening to music.  INTERVENTION:  Go to [facility] on Tuesdays and Thursday. Begin with 30 minutes. Dance while listening to music in afternoon. Walk with friend as often as possible.  Continue to think about food guide plate when planning meals and eating "out". Use plate given as a guide. Balance meals with protein (meat, egg, cheese) with starch (potato, rice, bread, cereal, oatmeal, macaroni and cheese for examples) and non-starchy vegetables. Continue to add a fruit or vegetable or both at meals. Measure out some portions, especially of starchy foods.  EDUCATION MATERIALS GIVEN:  . Plate Planner- Divided actual plate . Food lists/ Planning A Balanced Meal (2nd copy) . Goals/ instructions  LEARNER/ who was taught:  . Patient              [place] coach LEVEL OF UNDERSTANDING: . Partial understanding; needs review/ practice Demonstrated degree of understanding via teach back.  LEARNING BARRIERS: . Cognitive limitations  WILLINGNESS TO LEARN/READINESS FOR CHANGE: . Change in progress  MONITORING AND EVALUATION: diet, exercise, weight                     Follow-up: 01/21/17 at 10:30am

## 2016-11-26 NOTE — Patient Instructions (Signed)
Go to [facility] on Tuesdays and Thursday. Begin with 30 minutes. Dance while listening to music in afternoon. Walk with friend as often as possible.  Continue to think about food guide plate when planning meals and eating "out". Balance meals with protein (meat, egg, cheese) with starch (potato, rice, bread, cereal, oatmeal, macaroni and cheese for examples) and non-starchy vegetables. Continue to add a fruit or vegetable or both at meals. Measure out some portions, especially of starchy foods.

## 2016-12-26 ENCOUNTER — Emergency Department: Payer: Medicare Other

## 2016-12-26 ENCOUNTER — Emergency Department
Admission: EM | Admit: 2016-12-26 | Discharge: 2016-12-27 | Disposition: A | Discharge status: Home / Self Care | Payer: Medicare Other | Source: Home / Self Care | Attending: Emergency Medicine | Admitting: Emergency Medicine

## 2016-12-26 ENCOUNTER — Encounter: Payer: Self-pay | Admitting: Emergency Medicine

## 2016-12-26 DIAGNOSIS — E039 Hypothyroidism, unspecified: Secondary | ICD-10-CM

## 2016-12-26 DIAGNOSIS — F70 Mild intellectual disabilities: Secondary | ICD-10-CM | POA: Diagnosis not present

## 2016-12-26 DIAGNOSIS — R42 Dizziness and giddiness: Secondary | ICD-10-CM | POA: Diagnosis not present

## 2016-12-26 DIAGNOSIS — R0789 Other chest pain: Secondary | ICD-10-CM

## 2016-12-26 DIAGNOSIS — R7989 Other specified abnormal findings of blood chemistry: Secondary | ICD-10-CM | POA: Diagnosis not present

## 2016-12-26 DIAGNOSIS — R531 Weakness: Secondary | ICD-10-CM | POA: Diagnosis not present

## 2016-12-26 DIAGNOSIS — Z79899 Other long term (current) drug therapy: Secondary | ICD-10-CM

## 2016-12-26 DIAGNOSIS — F79 Unspecified intellectual disabilities: Secondary | ICD-10-CM

## 2016-12-26 DIAGNOSIS — F4310 Post-traumatic stress disorder, unspecified: Secondary | ICD-10-CM | POA: Diagnosis not present

## 2016-12-26 DIAGNOSIS — Z8742 Personal history of other diseases of the female genital tract: Secondary | ICD-10-CM

## 2016-12-26 DIAGNOSIS — R791 Abnormal coagulation profile: Secondary | ICD-10-CM | POA: Insufficient documentation

## 2016-12-26 DIAGNOSIS — F331 Major depressive disorder, recurrent, moderate: Secondary | ICD-10-CM | POA: Diagnosis not present

## 2016-12-26 DIAGNOSIS — R0781 Pleurodynia: Secondary | ICD-10-CM | POA: Insufficient documentation

## 2016-12-26 DIAGNOSIS — R4020 Unspecified coma: Secondary | ICD-10-CM | POA: Diagnosis not present

## 2016-12-26 DIAGNOSIS — Z90710 Acquired absence of both cervix and uterus: Secondary | ICD-10-CM | POA: Diagnosis not present

## 2016-12-26 DIAGNOSIS — R079 Chest pain, unspecified: Secondary | ICD-10-CM | POA: Diagnosis not present

## 2016-12-26 DIAGNOSIS — Z791 Long term (current) use of non-steroidal anti-inflammatories (NSAID): Secondary | ICD-10-CM | POA: Diagnosis not present

## 2016-12-26 LAB — BASIC METABOLIC PANEL
Anion gap: 8 (ref 5–15)
BUN: 10 mg/dL (ref 6–20)
CALCIUM: 8.9 mg/dL (ref 8.9–10.3)
CO2: 20 mmol/L — AB (ref 22–32)
CREATININE: 0.83 mg/dL (ref 0.44–1.00)
Chloride: 111 mmol/L (ref 101–111)
GFR calc non Af Amer: >60 mL/min (ref >60)
Glucose, Bld: 117 mg/dL — ABNORMAL HIGH (ref 65–99)
Potassium: 3.4 mmol/L — ABNORMAL LOW (ref 3.5–5.1)
Sodium: 139 mmol/L (ref 135–145)

## 2016-12-26 LAB — FIBRIN DERIVATIVES D-DIMER (ARMC ONLY): Fibrin derivatives D-dimer (ARMC): 530.46 — ABNORMAL HIGH (ref 0.00–499.00)

## 2016-12-26 LAB — CBC
HCT: 36.9 % (ref 35.0–47.0)
Hemoglobin: 12.4 g/dL (ref 12.0–16.0)
MCH: 24 pg — AB (ref 26.0–34.0)
MCHC: 33.5 g/dL (ref 32.0–36.0)
MCV: 71.6 fL — ABNORMAL LOW (ref 80.0–100.0)
PLATELETS: 290 10*3/uL (ref 150–440)
RBC: 5.15 MIL/uL (ref 3.80–5.20)
RDW: 17 % — ABNORMAL HIGH (ref 11.5–14.5)
WBC: 7.2 10*3/uL (ref 3.6–11.0)

## 2016-12-26 LAB — HCG, QUANTITATIVE, PREGNANCY: hCG, Beta Chain, Quant, S: <1 m[IU]/mL (ref <5)

## 2016-12-26 LAB — TROPONIN I

## 2016-12-26 MED ORDER — ONDANSETRON 4 MG PO TBDP
4.0000 mg | ORAL_TABLET | Freq: Once | ORAL | Status: AC
  Administered 2016-12-26: 4 mg via ORAL
  Filled 2016-12-26: qty 1

## 2016-12-26 MED ORDER — IOPAMIDOL (ISOVUE-370) INJECTION 76%
75.0000 mL | Freq: Once | INTRAVENOUS | Status: AC | PRN
  Administered 2016-12-26: 75 mL via INTRAVENOUS

## 2016-12-26 NOTE — ED Notes (Signed)
Pt had an episode of vomiting. Drank ginger ale pta. Advised nothing by mouth while here.

## 2016-12-26 NOTE — ED Provider Notes (Signed)
[facility] Emergency Department Provider Note  ____________________________________________   First MD Initiated Contact with Patient 12/26/16 2136     (approximate)  I have reviewed the triage vital signs and the nursing notes.   HISTORY  Chief Complaint Chest Pain; Shortness of Breath; and Dizziness   HPI Teresa Crawford is a 31 y.o. female who self presents to the emergency department with 5 hours of atypical chest pain. Her pain is sharp and worse with deep inspiration and associated with mild shortness of breath. No hemoptysis. She's had no leg swelling. She last had surgery in February. She has had no recent travel. She has no history of deep vein thrombosis or pulmonary embolism. She has had some mild nausea and has vomited once. She does take oral contraceptive pills. Her symptoms began insidiously and is been slowly progressive. Nothing seems to make them better or worse.   Past Medical History:  Diagnosis Date  . Chronic back pain   . Chronic fatigue syndrome   . Depression   . Elevated liver enzymes   . Endometriosis   . Hepatic steatosis   . Hypothyroid   . Last menstrual period (LMP) > 10 days ago 09/12/15  . Mental retardation   . Migraine   . Posttraumatic stress disorder   . PUD (peptic ulcer disease)   . Scoliosis   . Splenomegaly   . Uterine fibroid     Patient Active Problem List   Diagnosis Date Noted  . Self-inflicted laceration of wrist 04/07/2016  . Major depressive disorder, recurrent episode, moderate ([place]) 10/29/2014  . PTSD (post-traumatic stress disorder) 10/29/2014  . Grief 10/29/2014  . CFIDS (chronic fatigue and immune dysfunction syndrome) ([place]) 09/28/2014  . Aggrieved 09/28/2014  . Depression, major, recurrent ([place]) 09/28/2014  . Intellectual disability 09/28/2014  . Neurosis, posttraumatic 09/28/2014  . History of colitis 09/22/2014  . Hepatic steatosis 09/20/2014  . Endometriosis 08/30/2013    Past  Surgical History:  Procedure Laterality Date  . ABDOMINAL HYSTERECTOMY    . BACK SURGERY    . COLONOSCOPY  04/27/2006   Wohl-colitis, internal hemorrhoids  . COLONOSCOPY  02/2013   focal right colitis, left colon biopsy negative, focal active proctitis without chronicity  . ESOPHAGOGASTRODUODENOSCOPY  02/2013   multiple peptic & duodenal ulcers, negative h pylori  . LAPAROSCOPIC ENDOMETRIOSIS FULGURATION  2015   [name]    Prior to Admission medications   Medication Sig Start Date End Date Taking? Authorizing Provider  amoxicillin-clavulanate (AUGMENTIN) 875-125 MG tablet Take 1 tablet by mouth 2 (two) times daily.    [provider]  Butalbital-APAP-Caffeine 50-300-40 MG CAPS Take by mouth.    [provider]  cetirizine (ZYRTEC) 10 MG tablet Take 10 mg by mouth daily.    [provider]  cyanocobalamin (,VITAMIN B-12,) 1000 MCG/ML injection Inject 1 mL into the muscle every 30 (thirty) days.    [provider]  diazepam (VALIUM) 5 MG tablet Insert one tablet into the vagina at bedtime 11/26/14   [provider]  docusate sodium (COLACE) 100 MG capsule Take 100 mg by mouth 2 (two) times daily.     [provider]  DULoxetine (CYMBALTA) 30 MG capsule Take 1 capsule (30 mg total) by mouth daily. 10/15/16   Ravi, Himabindu, MD  EPINEPHrine (EPIPEN 2-PAK) 0.3 mg/0.3 mL IJ SOAJ injection Inject into the muscle.    [provider]  fluticasone (FLONASE) 50 MCG/ACT nasal spray Place 1 spray into both nostrils daily.  [provider]  hydrOXYzine (VISTARIL) 50 MG capsule Take 50 mg by mouth. 06/29/14   [provider]  naproxen (NAPROSYN) 500 MG tablet Take 1 tablet (500 mg total) by mouth 2 (two) times daily with a meal. 04/27/16   Lavonia Drafts, MD  norethindrone (AYGESTIN) 5 MG tablet Take 5 mg by mouth daily.    [provider]  omeprazole (PRILOSEC) 20 MG capsule Take 20 mg by mouth 2 (two) times daily  before a meal.     [provider]  prochlorperazine (COMPAZINE) 10 MG tablet Take 1 tablet (10 mg total) by mouth every 6 (six) hours as needed for nausea. 09/09/16   Paulette Blanch, MD  SUMAtriptan (IMITREX) 50 MG tablet Take by mouth.    [provider]  topiramate (TOPAMAX) 50 MG tablet Take 25 mg by mouth.     [provider]  traMADol (ULTRAM) 50 MG tablet Take 100 mg by mouth every 6 (six) hours as needed. For pain.    [provider]  traZODone (DESYREL) 50 MG tablet Take 1 tablet (50 mg total) by mouth at bedtime. 10/15/16   Elvin So, MD    Allergies Peanut butter flavor; Codeine; Paroxetine; Paxil [paroxetine hcl]; Venlafaxine; and Zoloft [sertraline hcl]  Family History  Problem Relation Age of Onset  . Breast cancer Mother 12  . COPD Father   . Lung cancer Father 63  . Dementia Father   . Diabetes Sister   . Diabetes Brother   . Ovarian cancer Paternal Aunt   . Colon cancer Maternal Grandfather 49  . Liver disease Maternal Aunt        ?etiology  . Lymphoma Maternal Grandmother 60  . Lung cancer Other 69       maternal great grandfather  . Stomach cancer Other 58       maternal great grandmother    Social History Social History  Substance Use Topics  . Smoking status: Never Smoker  . Smokeless tobacco: Never Used  . Alcohol use 0.0 oz/week     Comment: occ    Review of Systems Constitutional: No fever/chills Eyes: No visual changes. ENT: No sore throat. Cardiovascular: Positive chest pain. Respiratory: Positive shortness of breath. Gastrointestinal: No abdominal pain.  Positive nausea, positive vomiting.  No diarrhea.  No constipation. Genitourinary: Negative for dysuria. Musculoskeletal: Negative for back pain. Skin: Negative for rash. Neurological: Negative for headaches, focal weakness or numbness.   ____________________________________________   PHYSICAL EXAM:  VITAL SIGNS: ED Triage Vitals  Enc Vitals  Group     BP 12/26/16 2026 111/70     Pulse Rate 12/26/16 2026 (!) 102     Resp 12/26/16 2026 18     Temp 12/26/16 2026 98.4 F (36.9 C)     Temp Source 12/26/16 2026 Oral     SpO2 12/26/16 2026 100 %     Weight 12/26/16 2026 209 lb 14.1 oz (95.2 kg)     Height 12/26/16 2026 5\' 2"  (1.575 m)     Head Circumference --      Peak Flow --      Pain Score 12/26/16 2025 9     Pain Loc --      Pain Edu? --      Excl. in [place]? --     Constitutional: Alert and oriented 4 appears somewhat uncomfortable nontoxic no diaphoresis speaks full clear sentences Eyes: PERRL EOMI. Head: Atraumatic. Nose: No congestion/rhinnorhea. Mouth/Throat: No trismus Neck: No stridor.  Cardiovascular: Tachycardic rate, regular rhythm. Grossly normal heart sounds.  Good peripheral circulation. Respiratory: Normal respiratory effort.  No retractions. Lungs CTAB and moving good air Gastrointestinal: Soft nontender no rebound or guarding no peritonitis no McBurney's tenderness negative Rovsing's no costovertebral tenderness Musculoskeletal: No lower extremity edema  legs are equal in size Neurologic:  Normal speech and language. No gross focal neurologic deficits are appreciated. Skin:  Skin is warm, dry and intact. No rash noted. Psychiatric: Mood and affect are normal. Speech and behavior are normal.    ____________________________________________   DIFFERENTIAL includes but not limited to  Pulmonary embolism, pleurisy, pneumonia, acute coronary syndrome, Boerhaave syndrome, pneumothorax ____________________________________________   LABS (all labs ordered are listed, but only abnormal results are displayed)  Labs Reviewed  BASIC METABOLIC PANEL - Abnormal; Notable for the following:       Result Value   Potassium 3.4 (*)    CO2 20 (*)    Glucose, Bld 117 (*)    All other components within normal limits  CBC - Abnormal; Notable for the following:    MCV 71.6 (*)    MCH 24.0 (*)    RDW 17.0 (*)     All other components within normal limits  FIBRIN DERIVATIVES D-DIMER ([facility] ONLY) - Abnormal; Notable for the following:    Fibrin derivatives D-dimer ([facility]) 530.46 (*)    All other components within normal limits  TROPONIN I  HCG, QUANTITATIVE, PREGNANCY    Blood work reviewed and interpreted by me elevated d-dimer concerning for acute clot __________________________________________  EKG  ED ECG REPORT I, Darel Hong, the attending physician, personally viewed and interpreted this ECG.  Date: 12/26/2016 EKG Time:  Rate: 91 Rhythm: normal sinus rhythm QRS Axis: normal Intervals: normal ST/T Wave abnormalities: normal Narrative Interpretation: no evidence of acute ischemia  ____________________________________________  RADIOLOGY  Chest x-ray reviewed by me no acute disease ____________________________________________   PROCEDURES  Procedure(s) performed: yes  Angiocath insertion Performed by: Darel Hong  Consent: Verbal consent obtained. Risks and benefits: risks, benefits and alternatives were discussed Time out: Immediately prior to procedure a "time out" was called to verify the correct patient, procedure, equipment, support staff and site/side marked as required.  Preparation: Patient was prepped and draped in the usual sterile fashion.  Vein Location: Right antecubital fossa  Ultrasound Guided  Gauge: 20  Normal blood return and flush without difficulty Patient tolerance: Patient tolerated the procedure well with no immediate complications.     Procedures  Critical Care performed: no  Observation: no ____________________________________________   INITIAL IMPRESSION / ASSESSMENT AND PLAN / ED COURSE  Pertinent labs & imaging results that were available during my care of the patient were reviewed by me and considered in my medical decision making (see chart for details).  The patient arrives somewhat uncomfortable appearing and is low  risk for pulmonary embolism. She does have pleuritic chest pain and shortness of breath which does raise the concern however. She is tachycardic and on oral contraceptive pills so she fails PERC.  I will check a d-dimer reevaluate.     ----------------------------------------- 11:15 PM on 12/26/2016 -----------------------------------------  The patient's d-dimer came back elevated greater than 500 so we will progress on to CT scan of her chest. ____________________________________________   FINAL CLINICAL IMPRESSION(S) / ED DIAGNOSES  Final diagnoses:  Pleuritic chest pain      NEW MEDICATIONS STARTED DURING THIS VISIT:  New Prescriptions   No medications on file     Note:  This  document was prepared using Systems analyst and may include unintentional dictation errors.     Darel Hong, MD 12/26/16 2324

## 2016-12-26 NOTE — ED Triage Notes (Signed)
Patient with complaint of central chest pain radiating to left arm with dizziness and shortness of breath that started about 5 hours ago.

## 2016-12-26 NOTE — ED Notes (Signed)
Pt to lobby via wheelchair; Pt arrived via EMS from home with c/o chest pain/rib pain; left sided wall pain that replicates on palpation; BP 109/84; HR 102; pt anxious as she has a friend that passed away on 2022/08/03

## 2016-12-27 DIAGNOSIS — R7989 Other specified abnormal findings of blood chemistry: Secondary | ICD-10-CM | POA: Diagnosis not present

## 2016-12-27 MED ORDER — KETOROLAC TROMETHAMINE 30 MG/ML IJ SOLN
30.0000 mg | Freq: Once | INTRAMUSCULAR | Status: AC
  Administered 2016-12-27: 30 mg via INTRAVENOUS
  Filled 2016-12-27: qty 1

## 2016-12-27 MED ORDER — KETOROLAC TROMETHAMINE 10 MG PO TABS: 10.0000 mg | ORAL_TABLET | Freq: Four times a day (QID) | ORAL | 0 refills | Status: DC | PRN

## 2016-12-27 NOTE — ED Notes (Signed)
Reviewed d/c instructions, follow-up care, prescription with patient. Pt verbalized understanding.  

## 2016-12-27 NOTE — ED Provider Notes (Signed)
I assumed care of the patient from [place] at 11:00 PM.patient has reproducible chest pain given Toradol 30 mg at this time and will be referred to primary care provider for further outpatient evaluation.   Gregor Hams, MD 12/27/16 [phone]

## 2016-12-28 ENCOUNTER — Emergency Department: Payer: Medicare Other

## 2016-12-28 ENCOUNTER — Other Ambulatory Visit: Payer: Self-pay

## 2016-12-28 ENCOUNTER — Inpatient Hospital Stay
Admission: EM | Admit: 2016-12-28 | Discharge: 2017-01-01 | DRG: 948 | Disposition: A | Discharge status: Home Health | Payer: Medicare Other | Attending: Internal Medicine | Admitting: Internal Medicine

## 2016-12-28 ENCOUNTER — Encounter: Payer: Self-pay | Admitting: Emergency Medicine

## 2016-12-28 DIAGNOSIS — Z888 Allergy status to other drugs, medicaments and biological substances status: Secondary | ICD-10-CM | POA: Diagnosis not present

## 2016-12-28 DIAGNOSIS — F339 Major depressive disorder, recurrent, unspecified: Secondary | ICD-10-CM | POA: Diagnosis present

## 2016-12-28 DIAGNOSIS — Z7951 Long term (current) use of inhaled steroids: Secondary | ICD-10-CM | POA: Diagnosis not present

## 2016-12-28 DIAGNOSIS — Z90710 Acquired absence of both cervix and uterus: Secondary | ICD-10-CM

## 2016-12-28 DIAGNOSIS — M47816 Spondylosis without myelopathy or radiculopathy, lumbar region: Secondary | ICD-10-CM | POA: Diagnosis not present

## 2016-12-28 DIAGNOSIS — L89151 Pressure ulcer of sacral region, stage 1: Secondary | ICD-10-CM | POA: Diagnosis present

## 2016-12-28 DIAGNOSIS — R29898 Other symptoms and signs involving the musculoskeletal system: Secondary | ICD-10-CM | POA: Diagnosis not present

## 2016-12-28 DIAGNOSIS — M4185 Other forms of scoliosis, thoracolumbar region: Secondary | ICD-10-CM | POA: Diagnosis not present

## 2016-12-28 DIAGNOSIS — Z885 Allergy status to narcotic agent status: Secondary | ICD-10-CM | POA: Diagnosis not present

## 2016-12-28 DIAGNOSIS — R42 Dizziness and giddiness: Secondary | ICD-10-CM | POA: Diagnosis not present

## 2016-12-28 DIAGNOSIS — F70 Mild intellectual disabilities: Secondary | ICD-10-CM | POA: Diagnosis present

## 2016-12-28 DIAGNOSIS — R4020 Unspecified coma: Secondary | ICD-10-CM | POA: Diagnosis not present

## 2016-12-28 DIAGNOSIS — F331 Major depressive disorder, recurrent, moderate: Secondary | ICD-10-CM | POA: Diagnosis present

## 2016-12-28 DIAGNOSIS — Z6836 Body mass index (BMI) 36.0-36.9, adult: Secondary | ICD-10-CM | POA: Diagnosis not present

## 2016-12-28 DIAGNOSIS — G8929 Other chronic pain: Secondary | ICD-10-CM | POA: Diagnosis present

## 2016-12-28 DIAGNOSIS — M549 Dorsalgia, unspecified: Secondary | ICD-10-CM | POA: Diagnosis present

## 2016-12-28 DIAGNOSIS — Z79899 Other long term (current) drug therapy: Secondary | ICD-10-CM

## 2016-12-28 DIAGNOSIS — M5126 Other intervertebral disc displacement, lumbar region: Secondary | ICD-10-CM | POA: Diagnosis not present

## 2016-12-28 DIAGNOSIS — F4310 Post-traumatic stress disorder, unspecified: Secondary | ICD-10-CM | POA: Diagnosis present

## 2016-12-28 DIAGNOSIS — E039 Hypothyroidism, unspecified: Secondary | ICD-10-CM | POA: Diagnosis not present

## 2016-12-28 DIAGNOSIS — Z791 Long term (current) use of non-steroidal anti-inflammatories (NSAID): Secondary | ICD-10-CM

## 2016-12-28 DIAGNOSIS — R531 Weakness: Principal | ICD-10-CM

## 2016-12-28 DIAGNOSIS — F79 Unspecified intellectual disabilities: Secondary | ICD-10-CM

## 2016-12-28 DIAGNOSIS — L8990 Pressure ulcer of unspecified site, unspecified stage: Secondary | ICD-10-CM | POA: Insufficient documentation

## 2016-12-28 DIAGNOSIS — M419 Scoliosis, unspecified: Secondary | ICD-10-CM | POA: Diagnosis not present

## 2016-12-28 DIAGNOSIS — R442 Other hallucinations: Secondary | ICD-10-CM | POA: Diagnosis not present

## 2016-12-28 DIAGNOSIS — K219 Gastro-esophageal reflux disease without esophagitis: Secondary | ICD-10-CM | POA: Diagnosis present

## 2016-12-28 LAB — CBC
HEMATOCRIT: 38.7 % (ref 35.0–47.0)
HEMOGLOBIN: 12.7 g/dL (ref 12.0–16.0)
MCH: 23.7 pg — ABNORMAL LOW (ref 26.0–34.0)
MCHC: 32.9 g/dL (ref 32.0–36.0)
MCV: 72 fL — AB (ref 80.0–100.0)
Platelets: 337 10*3/uL (ref 150–440)
RBC: 5.38 MIL/uL — AB (ref 3.80–5.20)
RDW: 16.8 % — ABNORMAL HIGH (ref 11.5–14.5)
WBC: 9.2 10*3/uL (ref 3.6–11.0)

## 2016-12-28 LAB — PROTIME-INR
INR: 1.05
PROTHROMBIN TIME: 13.6 s (ref 11.4–15.2)

## 2016-12-28 LAB — CK: Total CK: 133 U/L (ref 38–234)

## 2016-12-28 LAB — BASIC METABOLIC PANEL
ANION GAP: 9 (ref 5–15)
BUN: 8 mg/dL (ref 6–20)
CHLORIDE: 110 mmol/L (ref 101–111)
CO2: 22 mmol/L (ref 22–32)
Calcium: 9 mg/dL (ref 8.9–10.3)
Creatinine, Ser: 0.88 mg/dL (ref 0.44–1.00)
GFR calc Af Amer: >60 mL/min (ref >60)
GFR calc non Af Amer: >60 mL/min (ref >60)
Glucose, Bld: 88 mg/dL (ref 65–99)
POTASSIUM: 3.3 mmol/L — AB (ref 3.5–5.1)
SODIUM: 141 mmol/L (ref 135–145)

## 2016-12-28 LAB — TSH: TSH: 4.71 u[IU]/mL — ABNORMAL HIGH (ref 0.350–4.500)

## 2016-12-28 MED ORDER — LIDOCAINE HCL (PF) 1 % IJ SOLN
5.0000 mL | Freq: Once | INTRAMUSCULAR | Status: AC
  Administered 2016-12-28: 5 mL via INTRADERMAL
  Filled 2016-12-28: qty 5

## 2016-12-28 NOTE — ED Notes (Signed)
Dr. Mable Paris tried to do a lumbar puncture. Attempt was unsuccessful due to Pt's previous back surgery.

## 2016-12-28 NOTE — ED Provider Notes (Signed)
[facility] Emergency Department Provider Note  ____________________________________________   First MD Initiated Contact with Patient 12/28/16 2134     (approximate)  I have reviewed the triage vital signs and the nursing notes.   HISTORY  Chief Complaint Weakness  level V exemption history Limited by the patient's developmental delay  HPI Teresa Crawford is a 31 y.o. female who comes to the emergency department with roughly 24 hours of fatigue weakness and difficulty standing. I saw and treated the patient yesterday when she was here for pleuritic chest pain. She ended up having a positive d-dimer so she subsequently had a CT scan of her chest which was negative and she was discharged home in improved condition. She says that following her discharge home she is felt her legs give out and difficulty walking. It is difficult for her described she says that when she stands up she feels like her knees are going to buckle.   Past Medical History:  Diagnosis Date  . Chronic back pain   . Chronic fatigue syndrome   . Depression   . Elevated liver enzymes   . Endometriosis   . Hepatic steatosis   . Hypothyroid   . Last menstrual period (LMP) > 10 days ago 09/12/15  . Mental retardation   . Migraine   . Posttraumatic stress disorder   . PUD (peptic ulcer disease)   . Scoliosis   . Splenomegaly   . Uterine fibroid     Patient Active Problem List   Diagnosis Date Noted  . Scoliosis 12/28/2016  . Hypothyroidism 12/28/2016  . Lower extremity weakness 12/28/2016  . Self-inflicted laceration of wrist 04/07/2016  . Major depressive disorder, recurrent episode, moderate ([place]) 10/29/2014  . PTSD (post-traumatic stress disorder) 10/29/2014  . Grief 10/29/2014  . CFIDS (chronic fatigue and immune dysfunction syndrome) ([place]) 09/28/2014  . Aggrieved 09/28/2014  . Depression, major, recurrent ([place]) 09/28/2014  . Intellectual disability 09/28/2014  . Neurosis,  posttraumatic 09/28/2014  . History of colitis 09/22/2014  . Hepatic steatosis 09/20/2014  . Endometriosis 08/30/2013    Past Surgical History:  Procedure Laterality Date  . ABDOMINAL HYSTERECTOMY    . BACK SURGERY    . COLONOSCOPY  04/27/2006   Wohl-colitis, internal hemorrhoids  . COLONOSCOPY  02/2013   focal right colitis, left colon biopsy negative, focal active proctitis without chronicity  . ESOPHAGOGASTRODUODENOSCOPY  02/2013   multiple peptic & duodenal ulcers, negative h pylori  . LAPAROSCOPIC ENDOMETRIOSIS FULGURATION  2015   [name]    Prior to Admission medications   Medication Sig Start Date End Date Taking? Authorizing Provider  amoxicillin-clavulanate (AUGMENTIN) 875-125 MG tablet Take 1 tablet by mouth 2 (two) times daily.    [provider]  Butalbital-APAP-Caffeine 50-300-40 MG CAPS Take by mouth.    [provider]  cetirizine (ZYRTEC) 10 MG tablet Take 10 mg by mouth daily.    [provider]  cyanocobalamin (,VITAMIN B-12,) 1000 MCG/ML injection Inject 1 mL into the muscle every 30 (thirty) days.    [provider]  diazepam (VALIUM) 5 MG tablet Insert one tablet into the vagina at bedtime 11/26/14   [provider]  docusate sodium (COLACE) 100 MG capsule Take 100 mg by mouth 2 (two) times daily.     [provider]  DULoxetine (CYMBALTA) 30 MG capsule Take 1 capsule (30 mg total) by mouth daily. 10/15/16   Ravi, Himabindu, MD  EPINEPHrine (EPIPEN 2-PAK) 0.3 mg/0.3 mL IJ SOAJ injection Inject  into the muscle.    [provider]  fluticasone (FLONASE) 50 MCG/ACT nasal spray Place 1 spray into both nostrils daily.    [provider]  hydrOXYzine (VISTARIL) 50 MG capsule Take 50 mg by mouth. 06/29/14   [provider]  ketorolac (TORADOL) 10 MG tablet Take 1 tablet (10 mg total) by mouth every 6 (six) hours as needed. 12/27/16   Gregor Hams, MD  naproxen (NAPROSYN) 500 MG tablet Take 1  tablet (500 mg total) by mouth 2 (two) times daily with a meal. 04/27/16   Lavonia Drafts, MD  norethindrone (AYGESTIN) 5 MG tablet Take 5 mg by mouth daily.    [provider]  omeprazole (PRILOSEC) 20 MG capsule Take 20 mg by mouth 2 (two) times daily before a meal.     [provider]  prochlorperazine (COMPAZINE) 10 MG tablet Take 1 tablet (10 mg total) by mouth every 6 (six) hours as needed for nausea. 09/09/16   Paulette Blanch, MD  SUMAtriptan (IMITREX) 50 MG tablet Take by mouth.    [provider]  topiramate (TOPAMAX) 50 MG tablet Take 25 mg by mouth.     [provider]  traMADol (ULTRAM) 50 MG tablet Take 100 mg by mouth every 6 (six) hours as needed. For pain.    [provider]  traZODone (DESYREL) 50 MG tablet Take 1 tablet (50 mg total) by mouth at bedtime. 10/15/16   Elvin So, MD    Allergies Peanut butter flavor; Codeine; Paroxetine; Paxil [paroxetine hcl]; Venlafaxine; and Zoloft [sertraline hcl]  Family History  Problem Relation Age of Onset  . Breast cancer Mother 49  . COPD Father   . Lung cancer Father 4  . Dementia Father   . Diabetes Sister   . Diabetes Brother   . Ovarian cancer Paternal Aunt   . Colon cancer Maternal Grandfather 26  . Liver disease Maternal Aunt        ?etiology  . Lymphoma Maternal Grandmother 60  . Lung cancer Other 28       maternal great grandfather  . Stomach cancer Other 53       maternal great grandmother    Social History Social History  Substance Use Topics  . Smoking status: Never Smoker  . Smokeless tobacco: Never Used  . Alcohol use 0.0 oz/week     Comment: occ    Review of Systems Constitutional: No fever/chills Eyes: No visual changes. ENT: No sore throat. Cardiovascular: Denies chest pain. Respiratory: Denies shortness of breath. Gastrointestinal: No abdominal pain.  No nausea, no vomiting.  No diarrhea.  No constipation. Genitourinary: Negative for  dysuria. Musculoskeletal: Negative for back pain. Skin: Negative for rash. Neurological: positive for focal weakness negative for numbness   ____________________________________________   PHYSICAL EXAM:  VITAL SIGNS: ED Triage Vitals [12/28/16 2118]  Enc Vitals Group     BP 127/86     Pulse Rate (!) 113     Resp 17     Temp 99.1 F (37.3 C)     Temp Source Oral     SpO2 100 %     Weight 207 lb (93.9 kg)     Height 5\' 3"  (1.6 m)     Head Circumference      Peak Flow      Pain Score      Pain Loc      Pain Edu?      Excl. in [place]?     Constitutional:  alert and oriented 4 developmentally delayed no acute distress Eyes: PERRL EOMI. Head: Atraumatic. Nose: No congestion/rhinnorhea. Mouth/Throat: No trismus Neck: No stridor.   Cardiovascular: tachycardicrate, regular rhythm. Grossly normal heart sounds.  Good peripheral circulation. Respiratory: Normal respiratory effort.  No retractions. Lungs CTAB and moving good air Gastrointestinal: morbidly obese Musculoskeletal: No lower extremity edema   Neurologic:  unable to stand  decreased strength  in bilateral hip flexion and hip extension plantar flexion dorsiflexion A shunt throughout bilateral lower extremitiesDecreased deep tendon reflexes at knees bilaterally Normal strength in upper extremities Skin:  Skin is warm, dry and intact. No rash noted. Psychiatric: Mood and affect are normal. Speech and behavior are normal.    ____________________________________________   DIFFERENTIAL includes but not limited to  stroke, intracerebral mass, likely on Barr syndrome, metabolic derangement peripheral neuropathy ____________________________________________   LABS (all labs ordered are listed, but only abnormal results are displayed)  Labs Reviewed  BASIC METABOLIC PANEL - Abnormal; Notable for the following:       Result Value   Potassium 3.3 (*)    All other components within normal limits  CBC - Abnormal;  Notable for the following:    RBC 5.38 (*)    MCV 72.0 (*)    MCH 23.7 (*)    RDW 16.8 (*)    All other components within normal limits  TSH - Abnormal; Notable for the following:    TSH 4.710 (*)    All other components within normal limits  CK  PROTIME-INR  URINALYSIS, COMPLETE (UACMP) WITH MICROSCOPIC  CBG MONITORING, ED    blood work reviewed and interpreted by me acute disease appreciated __________________________________________  EKG  ED ECG REPORT I, Darel Hong, the attending physician, personally viewed and interpreted this ECG.  Date: 12/28/2016 EKG Time:  Rate: 104 Rhythm: sinus tachycardia QRS Axis: normal Intervals: normal ST/T Wave abnormalities: normal Narrative Interpretation: no evidence of acute ischemia  ____________________________________________  RADIOLOGY  head CT reviewed by me unremarkable ____________________________________________   PROCEDURES  Procedure(s) performed: no  Procedures  Critical Care performed: no  Observation: no ____________________________________________   INITIAL IMPRESSION / ASSESSMENT AND PLAN / ED COURSE  Pertinent labs & imaging results that were available during my care of the patient were reviewed by me and considered in my medical decision making (see chart for details).  the patient is somewhat anxious appearing and tachycardic. History is very challenging to obtain this is difficult for the patient to describe the sensation she is feeling. She was able to stand up on her own although when she attempted to take a step she nearly fell to the ground. At this point I will CT her head and abdomen on a CK and a TSH and reevaluate.     ----------------------------------------- 10:04 PM on 12/28/2016 -----------------------------------------  On further questioning the patient thinks she had a URI and some sore throat may be 2-3 weeks ago. Her exam is challenging to perform however she does seem to be  weaker in her lower extremities with normal strength in her upper. I do have some concern for [place] barr syndrome and she is consented to lumbar puncture. ____________________________________________  ----------------------------------------- 10:48 PM on 12/28/2016 -----------------------------------------  I attempted to perform the lumbar puncture at multiple levels and was unsuccessful secondary to the patient's habitus, scoliosis, and hardware. I then discussed the case with on-call neurologist at [facility] Dr. Chong Sicilian recommended inpatient admission for IR guided lumbar puncture.   FINAL CLINICAL IMPRESSION(S) / ED DIAGNOSES  Final diagnoses:  Weakness      NEW MEDICATIONS STARTED DURING THIS VISIT:  New Prescriptions   No medications on file     Note:  This document was prepared using Dragon voice recognition software and may include unintentional dictation errors.     Darel Hong, MD 12/28/16 [phone]

## 2016-12-28 NOTE — H&P (Signed)
[place] [facility] [place] NAME: Teresa Crawford    MR#:  [id number]  DATE OF BIRTH:  10-28-1985  DATE OF ADMISSION:  12/28/2016  PRIMARY CARE PHYSICIAN: Lavera Guise, MD   REQUESTING/REFERRING PHYSICIAN: Mable Paris, MD  CHIEF COMPLAINT:   Chief Complaint  Patient presents with  . Weakness    HISTORY OF PRESENT ILLNESS:  Teresa Crawford  is a 31 y.o. female who presents with lateral lower extremity weakness. Patient states that she's had some viral illness over the past week or so, which include a risk for symptoms but also GI upset and nausea with vomiting. Over the last several days she's gotten progressively weaker in her lower extremities, and like her legs give way when she stands up, and reporting that she actually had to crawl around her house due to being so weak. At that point she came in for evaluation again. There is some concern initially by ED provider for possible atypical Guillain-Barr, lumbar puncture was attempted but unsuccessful. Hospitalists were called for admission and further evaluation  PAST MEDICAL HISTORY:   Past Medical History:  Diagnosis Date  . Chronic back pain   . Chronic fatigue syndrome   . Depression   . Elevated liver enzymes   . Endometriosis   . Hepatic steatosis   . Hypothyroid   . Last menstrual period (LMP) > 10 days ago 09/12/15  . Mental retardation   . Migraine   . Posttraumatic stress disorder   . PUD (peptic ulcer disease)   . Scoliosis   . Splenomegaly   . Uterine fibroid     PAST SURGICAL HISTORY:   Past Surgical History:  Procedure Laterality Date  . ABDOMINAL HYSTERECTOMY    . BACK SURGERY    . COLONOSCOPY  04/27/2006   Wohl-colitis, internal hemorrhoids  . COLONOSCOPY  02/2013   focal right colitis, left colon biopsy negative, focal active proctitis without chronicity  . ESOPHAGOGASTRODUODENOSCOPY  02/2013   multiple peptic & duodenal ulcers, negative h pylori  .  LAPAROSCOPIC ENDOMETRIOSIS FULGURATION  2015   [name]    SOCIAL HISTORY:   Social History  Substance Use Topics  . Smoking status: Never Smoker  . Smokeless tobacco: Never Used  . Alcohol use 0.0 oz/week     Comment: occ    FAMILY HISTORY:   Family History  Problem Relation Age of Onset  . Breast cancer Mother 15  . COPD Father   . Lung cancer Father 30  . Dementia Father   . Diabetes Sister   . Diabetes Brother   . Ovarian cancer Paternal Aunt   . Colon cancer Maternal Grandfather 69  . Liver disease Maternal Aunt        ?etiology  . Lymphoma Maternal Grandmother 60  . Lung cancer Other 27       maternal great grandfather  . Stomach cancer Other 25       maternal great grandmother    DRUG ALLERGIES:   Allergies  Allergen Reactions  . Peanut Butter Flavor Swelling    Throat swells  . Codeine Nausea And Vomiting    nausea  . Paroxetine     Patient states the cause of a panic attack but denies an actual physical injury.  . Paxil [Paroxetine Hcl] Other (See Comments)    Panic attacks  . Venlafaxine     Patient states it causes a panic attack but denies it ever caused a physical manifestation  . Zoloft [  Sertraline Hcl] Other (See Comments)    Panic attacks    MEDICATIONS AT HOME:   Prior to Admission medications   Medication Sig Start Date End Date Taking? Authorizing Provider  amoxicillin-clavulanate (AUGMENTIN) 875-125 MG tablet Take 1 tablet by mouth 2 (two) times daily.    [provider]  Butalbital-APAP-Caffeine 50-300-40 MG CAPS Take by mouth.    [provider]  cetirizine (ZYRTEC) 10 MG tablet Take 10 mg by mouth daily.    [provider]  cyanocobalamin (,VITAMIN B-12,) 1000 MCG/ML injection Inject 1 mL into the muscle every 30 (thirty) days.    [provider]  diazepam (VALIUM) 5 MG tablet Insert one tablet into the vagina at bedtime 11/26/14   [provider]  docusate sodium (COLACE) 100 MG capsule Take  100 mg by mouth 2 (two) times daily.     [provider]  DULoxetine (CYMBALTA) 30 MG capsule Take 1 capsule (30 mg total) by mouth daily. 10/15/16   Ravi, Himabindu, MD  EPINEPHrine (EPIPEN 2-PAK) 0.3 mg/0.3 mL IJ SOAJ injection Inject into the muscle.    [provider]  fluticasone (FLONASE) 50 MCG/ACT nasal spray Place 1 spray into both nostrils daily.    [provider]  hydrOXYzine (VISTARIL) 50 MG capsule Take 50 mg by mouth. 06/29/14   [provider]  ketorolac (TORADOL) 10 MG tablet Take 1 tablet (10 mg total) by mouth every 6 (six) hours as needed. 12/27/16   Gregor Hams, MD  naproxen (NAPROSYN) 500 MG tablet Take 1 tablet (500 mg total) by mouth 2 (two) times daily with a meal. 04/27/16   Lavonia Drafts, MD  norethindrone (AYGESTIN) 5 MG tablet Take 5 mg by mouth daily.    [provider]  omeprazole (PRILOSEC) 20 MG capsule Take 20 mg by mouth 2 (two) times daily before a meal.     [provider]  prochlorperazine (COMPAZINE) 10 MG tablet Take 1 tablet (10 mg total) by mouth every 6 (six) hours as needed for nausea. 09/09/16   Paulette Blanch, MD  SUMAtriptan (IMITREX) 50 MG tablet Take by mouth.    [provider]  topiramate (TOPAMAX) 50 MG tablet Take 25 mg by mouth.     [provider]  traMADol (ULTRAM) 50 MG tablet Take 100 mg by mouth every 6 (six) hours as needed. For pain.    [provider]  traZODone (DESYREL) 50 MG tablet Take 1 tablet (50 mg total) by mouth at bedtime. 10/15/16   Elvin So, MD    REVIEW OF SYSTEMS:  Review of Systems  Constitutional: Negative for chills, fever, malaise/fatigue and weight loss.  HENT: Negative for ear pain, hearing loss and tinnitus.   Eyes: Negative for blurred vision, double vision, pain and redness.  Respiratory: Positive for cough. Negative for hemoptysis and shortness of breath.   Cardiovascular: Negative for chest pain, palpitations, orthopnea and  leg swelling.  Gastrointestinal: Positive for nausea and vomiting. Negative for abdominal pain, constipation and diarrhea.  Genitourinary: Negative for dysuria, frequency and hematuria.  Musculoskeletal: Negative for back pain, joint pain and neck pain.  Skin:       No acne, rash, or lesions  Neurological: Positive for weakness. Negative for dizziness, tremors and focal weakness.  Endo/Heme/Allergies: Negative for polydipsia. Does not bruise/bleed easily.  Psychiatric/Behavioral: Negative for depression. The patient is not nervous/anxious and does not have insomnia.      VITAL SIGNS:   Vitals:   12/28/16 2118  12/28/16 2312  BP: 127/86 116/72  Pulse: (!) 113 95  Resp: 17 16  Temp: 99.1 F (37.3 C) 99 F (37.2 C)  TempSrc: Oral Oral  SpO2: 100% 100%  Weight: 93.9 kg (207 lb)   Height: 5\' 3"  (1.6 m)    Wt Readings from Last 3 Encounters:  12/28/16 93.9 kg (207 lb)  12/26/16 95.2 kg (209 lb 14.1 oz)  11/26/16 94 kg (207 lb 3.2 oz)    PHYSICAL EXAMINATION:  Physical Exam  Vitals reviewed. Constitutional: She is oriented to person, place, and time. She appears well-developed and well-nourished. No distress.  HENT:  Head: Normocephalic and atraumatic.  Mouth/Throat: Oropharynx is clear and moist.  Eyes: Pupils are equal, round, and reactive to light. Conjunctivae and EOM are normal. No scleral icterus.  Neck: Normal range of motion. Neck supple. No JVD present. No thyromegaly present.  Cardiovascular: Normal rate, regular rhythm and intact distal pulses.  Exam reveals no gallop and no friction rub.   No murmur heard. Respiratory: Effort normal and breath sounds normal. No respiratory distress. She has no wheezes. She has no rales.  GI: Soft. Bowel sounds are normal. She exhibits no distension. There is no tenderness.  Musculoskeletal: Normal range of motion. She exhibits no edema.  No arthritis, no gout  Lymphadenopathy:    She has no cervical adenopathy.  Neurological: She  is alert and oriented to person, place, and time. No cranial nerve deficit.  No dysarthria, no aphasia. Right greater than left lower extremity proximal weakness with 5 out of 5 proximal strength on the left, and week 4 out of 5 proximal strength on the right. Left greater than right distal lower extremity weakness, with 4/5 dorsiflexion strength on the left versus 5/5 dorsiflexion strength in the right. Normal bilateral upper extremity strength. No other significant neurologic abnormalities noted.  Skin: Skin is warm and dry. No rash noted. No erythema.  Psychiatric: She has a normal mood and affect. Her behavior is normal. Judgment and thought content normal.    LABORATORY PANEL:   CBC  Recent Labs Lab 12/28/16 2129  WBC 9.2  HGB 12.7  HCT 38.7  PLT 337   ------------------------------------------------------------------------------------------------------------------  Chemistries   Recent Labs Lab 12/28/16 2129  NA 141  K 3.3*  CL 110  CO2 22  GLUCOSE 88  BUN 8  CREATININE 0.88  CALCIUM 9.0   ------------------------------------------------------------------------------------------------------------------  Cardiac Enzymes  Recent Labs Lab 12/26/16 2035  TROPONINI <0.03   ------------------------------------------------------------------------------------------------------------------  RADIOLOGY:  Ct Head Wo Contrast  Result Date: 12/28/2016 CLINICAL DATA:  Altered level of consciousness. Generalized weakness and dizziness. EXAM: CT HEAD WITHOUT CONTRAST TECHNIQUE: Contiguous axial images were obtained from the base of the skull through the vertex without intravenous contrast. COMPARISON:  Head CT 09/09/2016 FINDINGS: Brain: No intracranial hemorrhage, mass effect, or midline shift. No hydrocephalus. The basilar cisterns are patent. No evidence of territorial infarct or acute ischemia. No extra-axial or intracranial fluid collection. Vascular: No hyperdense vessel.  Skull: No fracture or focal lesion. Sinuses/Orbits: Paranasal sinuses and mastoid air cells are clear. The visualized orbits are unremarkable. Other: None. IMPRESSION: No acute intracranial abnormality. Electronically Signed   By: Jeb Levering M.D.   On: 12/28/2016 22:04   Ct Angio Chest Pe W/cm &/or Wo Cm  Result Date: 12/27/2016 CLINICAL DATA:  Chest and rib pain, LEFT chest wall pain replicates on palpation. Elevated D-dimer. EXAM: CT ANGIOGRAPHY CHEST WITH CONTRAST TECHNIQUE: Multidetector CT imaging of the chest was performed using the  standard protocol during bolus administration of intravenous contrast. Multiplanar CT image reconstructions and MIPs were obtained to evaluate the vascular anatomy. Please note, initial bolus resultant and poor pulmonary arterial opacification, repeat examination performed. CONTRAST:  75 cc Isovue 370 COMPARISON:  Chest radiograph December 26, 2016 2042 hours FINDINGS: CARDIOVASCULAR: Adequate contrast opacification of the pulmonary artery's. Main pulmonary artery is not enlarged. No pulmonary arterial filling defects to the level of the subsegmental branches. Heart size is normal, no right heart strain. No pericardial effusion. Thoracic aorta is normal course and caliber, unremarkable. MEDIASTINUM/NODES: No lymphadenopathy by CT size criteria. LUNGS/PLEURA: Tracheobronchial tree is patent, no pneumothorax. No pleural effusions, focal consolidations, pulmonary nodules or masses. UPPER ABDOMEN: Included abdomen demonstrates hepatic steatosis. Borderline included splenomegaly. MUSCULOSKELETAL: Visualized soft tissues and included osseous structures are nonacute. Broad dextroscoliosis with thoracolumbar Harrington rods. Review of the MIP images confirms the above findings. IMPRESSION: Negative CT angiogram of the chest. Electronically Signed   By: Elon Alas M.D.   On: 12/27/2016 00:22    EKG:   Orders placed or performed during the hospital encounter of  12/28/16  . ED EKG  . ED EKG    IMPRESSION AND PLAN:  Principal Problem:   Lower extremity weakness - see history of present illness and exam for details, weakness is not symmetric, but potentially atypical GBS versus some other neurologic problem. We will admit her tonight, get fluoroscopy guided lumbar puncture tomorrow, get a neurology consult. Active Problems:   Scoliosis - history of the same, had hardware placed for this in the pastwhich made lumbar puncture difficult tonight.   Depression, major, recurrent ([place]) - continue home meds   Intellectual disability - mild, patient is able to interact and communicatewithout significant difficulty   Hypothyroidism - continue home meds  All the records are reviewed and case discussed with ED provider. Management plans discussed with the patient and/or family.  DVT PROPHYLAXIS: SubQ lovenox  GI PROPHYLAXIS: PPI  ADMISSION STATUS: Inpatient  CODE STATUS: Full Code Status History    This patient does not have a recorded code status. Please follow your organizational policy for patients in this situation.      TOTAL TIME TAKING CARE OF THIS PATIENT: 45 minutes.   Jannifer Franklin, Nekesha Font Crawford 12/28/2016, 11:17 PM  [employer]  [phone]  CC: Primary care physician; Lavera Guise, MD  Note:  This document was prepared using Dragon voice recognition software and may include unintentional dictation errors.

## 2016-12-28 NOTE — ED Triage Notes (Signed)
Patient presents to ED via POV from home with c/o generalized weakness and dizziness. No slurred speech or facial droop noted. Patient also reports frequent falls.

## 2016-12-29 ENCOUNTER — Inpatient Hospital Stay: Payer: Medicare Other

## 2016-12-29 DIAGNOSIS — R29898 Other symptoms and signs involving the musculoskeletal system: Secondary | ICD-10-CM

## 2016-12-29 LAB — URINALYSIS, COMPLETE (UACMP) WITH MICROSCOPIC
Bilirubin Urine: NEGATIVE
Glucose, UA: NEGATIVE mg/dL
Hgb urine dipstick: NEGATIVE
KETONES UR: NEGATIVE mg/dL
Nitrite: NEGATIVE
PROTEIN: 100 mg/dL — AB
Specific Gravity, Urine: 1.021 (ref 1.005–1.030)
pH: 7 (ref 5.0–8.0)

## 2016-12-29 LAB — CBC
HCT: 37.4 % (ref 35.0–47.0)
HEMOGLOBIN: 12.4 g/dL (ref 12.0–16.0)
MCH: 24.4 pg — AB (ref 26.0–34.0)
MCHC: 33.1 g/dL (ref 32.0–36.0)
MCV: 73.5 fL — ABNORMAL LOW (ref 80.0–100.0)
PLATELETS: 298 10*3/uL (ref 150–440)
RBC: 5.09 MIL/uL (ref 3.80–5.20)
RDW: 16.9 % — ABNORMAL HIGH (ref 11.5–14.5)
WBC: 8.7 10*3/uL (ref 3.6–11.0)

## 2016-12-29 LAB — URINE DRUG SCREEN, QUALITATIVE (ARMC ONLY)
AMPHETAMINES, UR SCREEN: NOT DETECTED
Barbiturates, Ur Screen: NOT DETECTED
Benzodiazepine, Ur Scrn: POSITIVE — AB
CANNABINOID 50 NG, UR ~~LOC~~: NOT DETECTED
COCAINE METABOLITE, UR ~~LOC~~: NOT DETECTED
MDMA (ECSTASY) UR SCREEN: NOT DETECTED
Methadone Scn, Ur: NOT DETECTED
Opiate, Ur Screen: NOT DETECTED
Phencyclidine (PCP) Ur S: NOT DETECTED
TRICYCLIC, UR SCREEN: POSITIVE — AB

## 2016-12-29 LAB — BASIC METABOLIC PANEL
ANION GAP: 7 (ref 5–15)
BUN: 7 mg/dL (ref 6–20)
CALCIUM: 9.1 mg/dL (ref 8.9–10.3)
CHLORIDE: 111 mmol/L (ref 101–111)
CO2: 23 mmol/L (ref 22–32)
Creatinine, Ser: 0.93 mg/dL (ref 0.44–1.00)
GFR calc non Af Amer: >60 mL/min (ref >60)
Glucose, Bld: 90 mg/dL (ref 65–99)
Potassium: 3.8 mmol/L (ref 3.5–5.1)
SODIUM: 141 mmol/L (ref 135–145)

## 2016-12-29 LAB — SURGICAL PCR SCREEN
MRSA, PCR: NEGATIVE
Staphylococcus aureus: NEGATIVE

## 2016-12-29 MED ORDER — TAMSULOSIN HCL 0.4 MG PO CAPS
0.4000 mg | ORAL_CAPSULE | Freq: Every day | ORAL | Status: DC
  Administered 2016-12-29 – 2017-01-01 (×4): 0.4 mg via ORAL
  Filled 2016-12-29 (×4): qty 1

## 2016-12-29 MED ORDER — ORAL CARE MOUTH RINSE
15.0000 mL | Freq: Two times a day (BID) | OROMUCOSAL | Status: DC
  Administered 2016-12-30 – 2016-12-31 (×3): 15 mL via OROMUCOSAL

## 2016-12-29 MED ORDER — PANTOPRAZOLE SODIUM 40 MG PO TBEC
40.0000 mg | DELAYED_RELEASE_TABLET | Freq: Two times a day (BID) | ORAL | Status: DC
  Administered 2016-12-29 – 2017-01-01 (×7): 40 mg via ORAL
  Filled 2016-12-29 (×6): qty 1

## 2016-12-29 MED ORDER — TOPIRAMATE 25 MG PO TABS
25.0000 mg | ORAL_TABLET | Freq: Every day | ORAL | Status: DC
  Administered 2016-12-29 – 2017-01-01 (×4): 25 mg via ORAL
  Filled 2016-12-29 (×4): qty 1

## 2016-12-29 MED ORDER — TRAMADOL HCL 50 MG PO TABS
100.0000 mg | ORAL_TABLET | Freq: Four times a day (QID) | ORAL | Status: DC | PRN
  Administered 2016-12-29 – 2016-12-30 (×4): 100 mg via ORAL
  Filled 2016-12-29 (×4): qty 2

## 2016-12-29 MED ORDER — PROCHLORPERAZINE MALEATE 10 MG PO TABS
10.0000 mg | ORAL_TABLET | Freq: Four times a day (QID) | ORAL | Status: DC | PRN
  Filled 2016-12-29: qty 1

## 2016-12-29 MED ORDER — ONDANSETRON HCL 4 MG PO TABS: 4.0000 mg | ORAL_TABLET | Freq: Four times a day (QID) | ORAL | Status: DC | PRN

## 2016-12-29 MED ORDER — DULOXETINE HCL 30 MG PO CPEP
30.0000 mg | ORAL_CAPSULE | Freq: Every day | ORAL | Status: DC
  Administered 2016-12-29 – 2017-01-01 (×4): 30 mg via ORAL
  Filled 2016-12-29 (×4): qty 1

## 2016-12-29 MED ORDER — GADOBENATE DIMEGLUMINE 529 MG/ML IV SOLN
20.0000 mL | Freq: Once | INTRAVENOUS | Status: AC | PRN
  Administered 2016-12-29: 19 mL via INTRAVENOUS

## 2016-12-29 MED ORDER — KETOROLAC TROMETHAMINE 10 MG PO TABS
10.0000 mg | ORAL_TABLET | Freq: Four times a day (QID) | ORAL | Status: DC | PRN
  Filled 2016-12-29: qty 1

## 2016-12-29 MED ORDER — ACETAMINOPHEN 650 MG RE SUPP
650.0000 mg | Freq: Four times a day (QID) | RECTAL | Status: DC | PRN
  Filled 2016-12-29 (×2): qty 1

## 2016-12-29 MED ORDER — TRAZODONE HCL 50 MG PO TABS
50.0000 mg | ORAL_TABLET | Freq: Every day | ORAL | Status: DC
  Administered 2016-12-29 – 2016-12-31 (×3): 50 mg via ORAL
  Filled 2016-12-29 (×3): qty 1

## 2016-12-29 MED ORDER — ACETAMINOPHEN 325 MG PO TABS
650.0000 mg | ORAL_TABLET | Freq: Four times a day (QID) | ORAL | Status: DC | PRN
  Administered 2016-12-31 (×2): 650 mg via ORAL
  Filled 2016-12-29 (×2): qty 2

## 2016-12-29 MED ORDER — ENOXAPARIN SODIUM 40 MG/0.4ML ~~LOC~~ SOLN
40.0000 mg | SUBCUTANEOUS | Status: DC
  Administered 2016-12-29 – 2016-12-30 (×2): 40 mg via SUBCUTANEOUS
  Filled 2016-12-29 (×2): qty 0.4

## 2016-12-29 MED ORDER — HYDROXYZINE PAMOATE 50 MG PO CAPS
50.0000 mg | ORAL_CAPSULE | Freq: Two times a day (BID) | ORAL | Status: DC | PRN
  Filled 2016-12-29: qty 1

## 2016-12-29 MED ORDER — ONDANSETRON HCL 4 MG/2ML IJ SOLN: 4.0000 mg | Freq: Four times a day (QID) | INTRAMUSCULAR | Status: DC | PRN

## 2016-12-29 MED ORDER — NORETHINDRONE ACETATE 5 MG PO TABS
5.0000 mg | ORAL_TABLET | Freq: Every day | ORAL | Status: DC
  Administered 2016-12-29 – 2017-01-01 (×4): 5 mg via ORAL
  Filled 2016-12-29 (×4): qty 1

## 2016-12-29 NOTE — ED Notes (Signed)
Assisted patient onto bedpan. 

## 2016-12-29 NOTE — NC FL2 (Signed)
[place] LEVEL OF CARE SCREENING TOOL     IDENTIFICATION  Patient Name: Teresa Crawford Birthdate: Nov 23, 1985 Sex: female Admission Date (Current Location): 12/28/2016  [place] and [place] Number:  Teresa Crawford  [id number] [place] and Address:  [facility], [address] [place], [place] [address]      Provider Number: [id number]  Attending Physician Name and Address:  Henreitta Leber, MD  Relative Name and Phone Number:       Current Level of Care: Hospital Recommended Level of Care: SNF  Prior Approval Number:    Date Approved/Denied:   PASRR Number:    Discharge Plan: SNF     Current Diagnoses: Patient Active Problem List   Diagnosis Date Noted  . Scoliosis 12/28/2016  . Hypothyroidism 12/28/2016  . Lower extremity weakness 12/28/2016  . Self-inflicted laceration of wrist 04/07/2016  . Major depressive disorder, recurrent episode, moderate ([place]) 10/29/2014  . PTSD (post-traumatic stress disorder) 10/29/2014  . Grief 10/29/2014  . CFIDS (chronic fatigue and immune dysfunction syndrome) ([place]) 09/28/2014  . Aggrieved 09/28/2014  . Depression, major, recurrent ([place]) 09/28/2014  . Intellectual disability 09/28/2014  . Neurosis, posttraumatic 09/28/2014  . History of colitis 09/22/2014  . Hepatic steatosis 09/20/2014  . Endometriosis 08/30/2013    Orientation RESPIRATION BLADDER Height & Weight     Self, Time, Situation, Place  Normal Continent Weight: 207 lb (93.9 kg) Height:  5\' 3"  (160 cm)  BEHAVIORAL SYMPTOMS/MOOD NEUROLOGICAL BOWEL NUTRITION STATUS      Continent Regular Diet   AMBULATORY STATUS COMMUNICATION OF NEEDS Skin   Extensive Verbally PU Stage and Appropriate Care (Pressure Ulcer Stage 2 on Buttocks)                       Personal Care Assistance Level of Assistance  Bathing, Feeding, Dressing Bathing Assistance: Limited assistance Feeding assistance: Independent Dressing Assistance: Limited  assistance     Functional Limitations Info  Sight, Hearing, Speech Sight Info: Adequate Hearing Info: Adequate Speech Info: Adequate    SPECIAL CARE FACTORS FREQUENCY   PT and OT   5 times per week                   Contractures      Additional Factors Info  Code Status, Allergies Code Status Info:  (Full Code) Allergies Info:  (PEANUT BUTTER FLAVOR, CODEINE, PAROXETINE, PAXIL PAROXETINE HCL, VENLAFAXINE, ZOLOFT SERTRALINE HCL )           Current Medications (12/29/2016):  This is the current hospital active medication list Current Facility-Administered Medications  Medication Dose Route Frequency Provider Last Rate Last Dose  . acetaminophen (TYLENOL) tablet 650 mg  650 mg Oral Q6H PRN Lance Coon, MD       Or  . acetaminophen (TYLENOL) suppository 650 mg  650 mg Rectal Q6H PRN Lance Coon, MD      . DULoxetine (CYMBALTA) DR capsule 30 mg  30 mg Oral Daily Lance Coon, MD      . enoxaparin (LOVENOX) injection 40 mg  40 mg Subcutaneous Q24H Lance Coon, MD   40 mg at 12/29/16 0404  . hydrOXYzine (VISTARIL) capsule 50 mg  50 mg Oral BID PRN Lance Coon, MD      . ketorolac (TORADOL) tablet 10 mg  10 mg Oral Q6H PRN Lance Coon, MD      . MEDLINE mouth rinse  15 mL Mouth Rinse BID Lance Coon, MD      .  norethindrone (AYGESTIN) tablet 5 mg  5 mg Oral Daily Lance Coon, MD      . ondansetron [facility]) tablet 4 mg  4 mg Oral Q6H PRN Lance Coon, MD       Or  . ondansetron [facility]) injection 4 mg  4 mg Intravenous Q6H PRN Lance Coon, MD      . pantoprazole (PROTONIX) EC tablet 40 mg  40 mg Oral BID Lance Coon, MD      . prochlorperazine (COMPAZINE) tablet 10 mg  10 mg Oral Q6H PRN Lance Coon, MD      . topiramate (TOPAMAX) tablet 25 mg  25 mg Oral Daily Lance Coon, MD      . traMADol [facility]) tablet 100 mg  100 mg Oral Q6H PRN Lance Coon, MD   100 mg at 12/29/16 0404  . traZODone (DESYREL) tablet 50 mg  50 mg Oral QHS Lance Coon, MD          Discharge Medications: Please see discharge summary for a list of discharge medications.  Relevant Imaging Results:  Relevant Lab Results:   Additional Information  (SSN: [id number])  Smith Mince, Student-Social Work

## 2016-12-29 NOTE — ED Notes (Signed)
Patient unable to provide urine sample at this time. Nurse alerted.

## 2016-12-29 NOTE — Progress Notes (Signed)
Chaplain responded to an OR for Advanced Directives. CH met with pt and educated pt about the AD. Pt states that he needs time to review material and will contact [place] when ready to complete it. [place] will follow up pt as needed.   12/29/16 1000  Clinical Encounter Type  Visited With Patient  Visit Type Initial  Referral From Nurse  Consult/Referral To Chaplain  Spiritual Encounters  Spiritual Needs Literature;Other (Comment)

## 2016-12-29 NOTE — Progress Notes (Signed)
Pt's post void residual was 400. Per Dr. Verdell Carmine, pt needs to be started on 0.4mg  of PO flomax daily and to perform in and out catheterization once. He also asked that we check a post void residual next time that she voids. Orders placed.

## 2016-12-29 NOTE — Consult Note (Addendum)
Reason for Consult:LE weakness Referring Physician: Verdell Carmine  CC: LE weakness  HPI: Teresa Crawford is an 31 y.o. female who reports that two days ago she began to experience lower extremity weakness.  She reports that she feels as if her legs are going to give out on her.  She has not sensory symptoms.  Does not report any bowel or bladder incontinence but feels that she must strain to urinate.  She reports no upper extremity complaints but does report that she has had difficulty swallowing for the past couple of days and noted nausea and vomiting filling multiple bags with vomitus.  Describes associated dizziness.  Patient also describes right occipital headaches that are worse with standing or movement.    Past Medical History:  Diagnosis Date  . Chronic back pain   . Chronic fatigue syndrome   . Depression   . Elevated liver enzymes   . Endometriosis   . Hepatic steatosis   . Hypothyroid   . Last menstrual period (LMP) > 10 days ago 09/12/15  . Mental retardation   . Migraine   . Posttraumatic stress disorder   . PUD (peptic ulcer disease)   . Scoliosis   . Splenomegaly   . Uterine fibroid     Past Surgical History:  Procedure Laterality Date  . ABDOMINAL HYSTERECTOMY    . BACK SURGERY    . COLONOSCOPY  04/27/2006   Wohl-colitis, internal hemorrhoids  . COLONOSCOPY  02/2013   focal right colitis, left colon biopsy negative, focal active proctitis without chronicity  . ESOPHAGOGASTRODUODENOSCOPY  02/2013   multiple peptic & duodenal ulcers, negative h pylori  . LAPAROSCOPIC ENDOMETRIOSIS FULGURATION  2015   [name]    Family History  Problem Relation Age of Onset  . Breast cancer Mother 4  . COPD Father   . Lung cancer Father 4  . Dementia Father   . Diabetes Sister   . Diabetes Brother   . Ovarian cancer Paternal Aunt   . Colon cancer Maternal Grandfather 28  . Liver disease Maternal Aunt        ?etiology  . Lymphoma Maternal Grandmother 60  . Lung cancer Other  60       maternal great grandfather  . Stomach cancer Other 8       maternal great grandmother    Social History:  reports that she has never smoked. She has never used smokeless tobacco. She reports that she drinks alcohol. She reports that she does not use drugs.  Allergies  Allergen Reactions  . Peanut Butter Flavor Swelling    Throat swells  . Codeine Nausea And Vomiting    nausea  . Paroxetine     Patient states the cause of a panic attack but denies an actual physical injury.  . Paxil [Paroxetine Hcl] Other (See Comments)    Panic attacks  . Venlafaxine     Patient states it causes a panic attack but denies it ever caused a physical manifestation  . Zoloft [Sertraline Hcl] Other (See Comments)    Panic attacks    Medications:  I have reviewed the patient's current medications. Prior to Admission:  Prescriptions Prior to Admission  Medication Sig Dispense Refill Last Dose  . diphenhydrAMINE (BENADRYL) 25 mg capsule Take 25 mg by mouth every 6 (six) hours as needed for allergies.   prn at prn  . amoxicillin-clavulanate (AUGMENTIN) 875-125 MG tablet Take 1 tablet by mouth 2 (two) times daily.   Taking  . Butalbital-APAP-Caffeine 50-300-40  MG CAPS Take by mouth.   Taking  . cetirizine (ZYRTEC) 10 MG tablet Take 10 mg by mouth daily.   Taking  . cyanocobalamin (,VITAMIN B-12,) 1000 MCG/ML injection Inject 1 mL into the muscle every 30 (thirty) days.   Taking  . diazepam (VALIUM) 5 MG tablet Insert one tablet into the vagina at bedtime   Taking  . docusate sodium (COLACE) 100 MG capsule Take 100 mg by mouth 2 (two) times daily.    Taking  . DULoxetine (CYMBALTA) 30 MG capsule Take 1 capsule (30 mg total) by mouth daily. 30 capsule 2   . EPINEPHrine (EPIPEN 2-PAK) 0.3 mg/0.3 mL IJ SOAJ injection Inject into the muscle.   Taking  . fluticasone (FLONASE) 50 MCG/ACT nasal spray Place 1 spray into both nostrils daily.   Taking  . hydrOXYzine (VISTARIL) 50 MG capsule Take 50 mg by  mouth.   Taking  . ketorolac (TORADOL) 10 MG tablet Take 1 tablet (10 mg total) by mouth every 6 (six) hours as needed. 20 tablet 0   . naproxen (NAPROSYN) 500 MG tablet Take 1 tablet (500 mg total) by mouth 2 (two) times daily with a meal. 20 tablet 2 Taking  . norethindrone (AYGESTIN) 5 MG tablet Take 5 mg by mouth daily.   Taking  . omeprazole (PRILOSEC) 20 MG capsule Take 20 mg by mouth 2 (two) times daily before a meal.    Taking  . prochlorperazine (COMPAZINE) 10 MG tablet Take 1 tablet (10 mg total) by mouth every 6 (six) hours as needed for nausea. 20 tablet 0 Taking  . SUMAtriptan (IMITREX) 50 MG tablet Take by mouth.   Taking  . topiramate (TOPAMAX) 50 MG tablet Take 25 mg by mouth.    Taking  . traMADol (ULTRAM) 50 MG tablet Take 100 mg by mouth every 6 (six) hours as needed. For pain.   Taking  . traZODone (DESYREL) 50 MG tablet Take 1 tablet (50 mg total) by mouth at bedtime. 30 tablet 3    Scheduled: . DULoxetine  30 mg Oral Daily  . enoxaparin (LOVENOX) injection  40 mg Subcutaneous Q24H  . mouth rinse  15 mL Mouth Rinse BID  . norethindrone  5 mg Oral Daily  . pantoprazole  40 mg Oral BID  . topiramate  25 mg Oral Daily  . traZODone  50 mg Oral QHS    ROS: History obtained from the patient  General ROS: negative for - chills, fatigue, fever, night sweats, weight gain or weight loss Psychological ROS: negative for - behavioral disorder, hallucinations, memory difficulties, mood swings or suicidal ideation Ophthalmic ROS: negative for - blurry vision, double vision, eye pain or loss of vision ENT ROS: ertigo Allergy and Immunology ROS: negative for - hives or itchy/watery eyes Hematological and Lymphatic ROS: negative for - bleeding problems, bruising or swollen lymph nodes Endocrine ROS: negative for - galactorrhea, hair pattern changes, polydipsia/polyuria or temperature intolerance Respiratory ROS: negative for - cough, hemoptysis, shortness of breath or  wheezing Cardiovascular ROS: negative for - chest pain, dyspnea on exertion, edema or irregular heartbeat Gastrointestinal ROS: nausea/vomiting  Genito-Urinary ROS: urinary retention Musculoskeletal ROS: negative for - joint swelling or muscular weakness Neurological ROS: as noted in HPI Dermatological ROS: negative for rash and skin lesion changes  Physical Examination: Blood pressure 115/65, pulse 99, temperature 99.1 F (37.3 C), temperature source Oral, resp. rate 18, height 5\' 3"  (1.6 m), weight 93.9 kg (207 lb), last menstrual period 01/05/2016, SpO2 100 %.  HEENT-  Normocephalic, no lesions, without obvious abnormality.  Normal external eye and conjunctiva.  Normal TM's bilaterally.  Normal auditory canals and external ears. Normal external nose, mucus membranes and septum.  Normal pharynx. Cardiovascular- S1, S2 normal, pulses palpable throughout   Lungs- chest clear, no wheezing, rales, normal symmetric air entry Abdomen- soft, non-tender; bowel sounds normal; no masses,  no organomegaly Extremities- no edema Lymph-no adenopathy palpable Musculoskeletal-no joint tenderness, deformity or swelling Skin-warm and dry, no hyperpigmentation, vitiligo, or suspicious lesions  Neurological Examination   Mental Status: Alert, oriented, thought content appropriate.  Speech fluent without evidence of aphasia.  Able to follow 3 step commands without difficulty. Cranial Nerves: II: Discs flat bilaterally; Visual fields grossly normal, pupils equal, round, reactive to light and accommodation III,IV, VI: ptosis not present, extra-ocular motions intact bilaterally V,VII: smile symmetric, facial light touch sensation normal bilaterally VIII: hearing normal bilaterally IX,X: gag reflex present XI: bilateral shoulder shrug XII: midline tongue extension Motor: Right : Upper extremity   5/5    Left:     Upper extremity   4+/5  Lower extremity   5/5     Lower extremity   5/5 Tone and bulk:normal  tone throughout; no atrophy noted Sensory: Pinprick and light touch decreased in the left upper and lower extremity Deep Tendon Reflexes: 2+ and symmetric throughout Plantars: Right: downgoing   Left: downgoing Cerebellar: Normal finger-to-nose and normal heel-to-shin testing bilaterally Gait: not tested due to safety concerns    Laboratory Studies:   Basic Metabolic Panel:  Recent Labs Lab 12/26/16 2035 12/28/16 2129 12/29/16 0413  NA 139 141 141  K 3.4* 3.3* 3.8  CL 111 110 111  CO2 20* 22 23  GLUCOSE 117* 88 90  BUN 10 8 7   CREATININE 0.83 0.88 0.93  CALCIUM 8.9 9.0 9.1    Liver Function Tests: No results for input(s): AST, ALT, ALKPHOS, BILITOT, PROT, ALBUMIN in the last 168 hours. No results for input(s): LIPASE, AMYLASE in the last 168 hours. No results for input(s): AMMONIA in the last 168 hours.  CBC:  Recent Labs Lab 12/26/16 2035 12/28/16 2129 12/29/16 0413  WBC 7.2 9.2 8.7  HGB 12.4 12.7 12.4  HCT 36.9 38.7 37.4  MCV 71.6* 72.0* 73.5*  PLT 290 337 298    Cardiac Enzymes:  Recent Labs Lab 12/26/16 2035 12/28/16 2129  CKTOTAL  --  133  TROPONINI <0.03  --     BNP: Invalid input(s): POCBNP  CBG: No results for input(s): GLUCAP in the last 168 hours.  Microbiology: Results for orders placed or performed during the hospital encounter of 12/28/16  Surgical PCR screen     Status: None   Collection Time: 12/29/16  1:30 AM  Result Value Ref Range Status   MRSA, PCR NEGATIVE NEGATIVE Final   Staphylococcus aureus NEGATIVE NEGATIVE Final    Comment: (NOTE) The Xpert SA Assay (FDA approved for NASAL specimens in patients 66 years of age and older), is one component of a comprehensive surveillance program. It is not intended to diagnose infection nor to guide or monitor treatment.     Coagulation Studies:  Recent Labs  12/28/16 2153  LABPROT 13.6  INR 1.05    Urinalysis:  Recent Labs Lab 12/29/16 0138  COLORURINE AMBER*   LABSPEC 1.021  PHURINE 7.0  GLUCOSEU NEGATIVE  HGBUR NEGATIVE  BILIRUBINUR NEGATIVE  KETONESUR NEGATIVE  PROTEINUR 100*  NITRITE NEGATIVE  LEUKOCYTESUR LARGE*    Lipid Panel:  No results found for:  CHOL, TRIG, HDL, CHOLHDL, VLDL, LDLCALC  HgbA1C: No results found for: HGBA1C  Urine Drug Screen:     Component Value Date/Time   LABOPIA NONE DETECTED 04/07/2016 1747   COCAINSCRNUR NONE DETECTED 04/07/2016 1747   LABBENZ POSITIVE (A) 04/07/2016 1747   AMPHETMU NONE DETECTED 04/07/2016 1747   THCU NONE DETECTED 04/07/2016 1747   LABBARB NONE DETECTED 04/07/2016 1747    Alcohol Level: No results for input(s): ETH in the last 168 hours.  Other results: EKG: sinus tachycardia at 104 bpm.  Imaging: Ct Head Wo Contrast  Result Date: 12/28/2016 CLINICAL DATA:  Altered level of consciousness. Generalized weakness and dizziness. EXAM: CT HEAD WITHOUT CONTRAST TECHNIQUE: Contiguous axial images were obtained from the base of the skull through the vertex without intravenous contrast. COMPARISON:  Head CT 09/09/2016 FINDINGS: Brain: No intracranial hemorrhage, mass effect, or midline shift. No hydrocephalus. The basilar cisterns are patent. No evidence of territorial infarct or acute ischemia. No extra-axial or intracranial fluid collection. Vascular: No hyperdense vessel. Skull: No fracture or focal lesion. Sinuses/Orbits: Paranasal sinuses and mastoid air cells are clear. The visualized orbits are unremarkable. Other: None. IMPRESSION: No acute intracranial abnormality. Electronically Signed   By: Jeb Levering M.D.   On: 12/28/2016 22:04     Assessment/Plan: 31 year old female with multiple new complaints including headache, dizziness, dysphagia, nausea, vomiting, urinary retention and lower extremity weakness.  She does have some focal findings on neurological examination that she reports have been present for many years,  Has required surgical intervention as a teenager for scoliosis.   Has long-standing left sided deficits. At this point patient with reflexes throughout and doubt GBS as etiology.  She has had surgical intervention in the past and would like to rule out the possibility of cord abnormality, particularly with coexistent urinary retention.  Patient also with other symptoms that can not be referable to the spinal cord so further brain imaging may be helpful as well.   ?UTI.  TSH mildly elevated.    Recommendations: 1.  MRI of the brain, thoracic and lumbar spines 2.  Would bladder scan to check post void residuals.     Alexis Goodell, MD Neurology [phone] 12/29/2016, 10:32 AM

## 2016-12-29 NOTE — Clinical Social Work Note (Signed)
Clinical Social Work Assessment  Patient Details  Name: Teresa Crawford MRN: [id number] Date of Birth: [birth year]  Date of referral:  12/29/16               Reason for consult:  Other (Comment Required) (Patient is from an independent living apartment. )                Permission sought to share information with:  Other (Community Case Manager Learta Codding. ) Permission granted to share information::  Yes, Verbal Permission Granted  Name::        Agency::     Relationship::     Contact Information:     Housing/Transportation Living arrangements for the past 2 months:  Apartment Source of Information:  Patient, Case Manager Patient Interpreter Needed:  None Criminal Activity/Legal Involvement Pertinent to Current Situation/Hospitalization:  No - Comment as needed Significant Relationships:  Significant Other Lives with:  Self Do you feel safe going back to the place where you live?  Yes Need for family participation in patient care:     Care giving concerns:  Patient lives in an independent living apartment on [address] in [place], [place] that is connected to [employer].    Social Worker assessment / plan:  Holiday representative (CSW) reviewed chart and noted that patient is from a group home however upon meeting with the patient she stated that she lives in an apartment. Patient was alert and oriented X4 and was sitting up in the bed eating lunch. CSW introduced self and explained role of CSW department. Patient reported that she lives in an apartment and her boyfriend is her primary support. Patient reported that she is ready to leave [facility] and go home. Patient reported no needs or concerns at this time. Patient gave CSW permission to contact her community case manager Elizabeth Huffines [phone]. CSW contacted Benjamine Mola and confirmed that patient is in independent living. Per Benjamine Mola patient is her own guardian and makes her own decisions. Per Benjamine Mola the nurse can  call the on call phone number [phone] when patient is stable for D/C and they will provide transport. CSW will continue to follow and assist as needed.   Employment status:  Disabled (Comment on whether or not currently receiving Disability) Insurance information:  Medicare, Medicaid In [place] PT Recommendations:  Not assessed at this time Information / Referral to community resources:  Other (Comment Required) (Patient will likely D/C home. )  Patient/Family's Response to care:  Patient is agreeable to return to her apartment.   Patient/Family's Understanding of and Emotional Response to Diagnosis, Current Treatment, and Prognosis:  Patient was pleasant and thanked CSW for visit.   Emotional Assessment Appearance:  Appears stated age Attitude/Demeanor/Rapport:    Affect (typically observed):  Accepting, Adaptable, Pleasant Orientation:  Oriented to Self, Oriented to Place, Oriented to  Time, Oriented to Situation Alcohol / Substance use:  Not Applicable Psych involvement (Current and /or in the community):  No (Comment)  Discharge Needs  Concerns to be addressed:  Discharge Planning Concerns Readmission within the last 30 days:  No Current discharge risk:  Substance Abuse Barriers to Discharge:  Continued Medical Work up   [employer], [name], LCSW 12/29/2016, 3:43 PM

## 2016-12-29 NOTE — Progress Notes (Signed)
Urine sample obtained and sent. Pt informed on POC. VSS. No acute distress at this time. Will monitor.

## 2016-12-29 NOTE — Progress Notes (Signed)
[facility] NAME: Teresa Crawford    MR#:  [id number]  DATE OF BIRTH:  [birth year]  SUBJECTIVE:   Patient here due to lower extremity weakness and difficulty walking. She denies any urinary or bowel incontinence. Patient also complains of some occipital headaches which is worse with standing.  REVIEW OF SYSTEMS:    Review of Systems  Constitutional: Negative for chills and fever.  HENT: Negative for congestion and tinnitus.   Eyes: Negative for blurred vision and double vision.  Respiratory: Negative for cough, shortness of breath and wheezing.   Cardiovascular: Negative for chest pain, orthopnea and PND.  Gastrointestinal: Negative for abdominal pain, diarrhea, nausea and vomiting.  Genitourinary: Negative for dysuria and hematuria.  Neurological: Positive for weakness. Negative for dizziness, sensory change and focal weakness.  All other systems reviewed and are negative.   Nutrition: Regular Tolerating Diet: Yes Tolerating PT: Await Eval.   DRUG ALLERGIES:   Allergies  Allergen Reactions  . Peanut Butter Flavor Swelling    Throat swells  . Codeine Nausea And Vomiting    nausea  . Paroxetine     Patient states the cause of a panic attack but denies an actual physical injury.  . Paxil [Paroxetine Hcl] Other (See Comments)    Panic attacks  . Venlafaxine     Patient states it causes a panic attack but denies it ever caused a physical manifestation  . Zoloft [Sertraline Hcl] Other (See Comments)    Panic attacks    VITALS:  Blood pressure 115/65, pulse 99, temperature 99.1 F (37.3 C), temperature source Oral, resp. rate 18, height 5\' 3"  (1.6 m), weight 93.9 kg (207 lb), last menstrual period 01/05/2016, SpO2 100 %.  PHYSICAL EXAMINATION:   Physical Exam  GENERAL:  31 y.o.-year-old patient lying in bed in no acute distress.  EYES: Pupils equal, round, reactive to light and accommodation. No scleral icterus.  Extraocular muscles intact.  HEENT: Head atraumatic, normocephalic. Oropharynx and nasopharynx clear.  NECK:  Supple, no jugular venous distention. No thyroid enlargement, no tenderness.  LUNGS: Normal breath sounds bilaterally, no wheezing, rales, rhonchi. No use of accessory muscles of respiration.  CARDIOVASCULAR: S1, S2 normal. No murmurs, rubs, or gallops.  ABDOMEN: Soft, nontender, nondistended. Bowel sounds present. No organomegaly or mass.  EXTREMITIES: No cyanosis, clubbing or edema b/l.    NEUROLOGIC: Cranial nerves II through XII are intact. No focal Motor or sensory deficits b/l.   PSYCHIATRIC: The patient is alert and oriented x 3.  SKIN: No obvious rash, lesion, or ulcer.    LABORATORY PANEL:   CBC  Recent Labs Lab 12/29/16 0413  WBC 8.7  HGB 12.4  HCT 37.4  PLT 298   ------------------------------------------------------------------------------------------------------------------  Chemistries   Recent Labs Lab 12/29/16 0413  NA 141  K 3.8  CL 111  CO2 23  GLUCOSE 90  BUN 7  CREATININE 0.93  CALCIUM 9.1   ------------------------------------------------------------------------------------------------------------------  Cardiac Enzymes  Recent Labs Lab 12/26/16 2035  TROPONINI <0.03   ------------------------------------------------------------------------------------------------------------------  RADIOLOGY:  Ct Head Wo Contrast  Result Date: 12/28/2016 CLINICAL DATA:  Altered level of consciousness. Generalized weakness and dizziness. EXAM: CT HEAD WITHOUT CONTRAST TECHNIQUE: Contiguous axial images were obtained from the base of the skull through the vertex without intravenous contrast. COMPARISON:  Head CT 09/09/2016 FINDINGS: Brain: No intracranial hemorrhage, mass effect, or midline shift. No hydrocephalus. The basilar cisterns are patent. No evidence of territorial infarct or acute ischemia. No extra-axial  or intracranial fluid collection.  Vascular: No hyperdense vessel. Skull: No fracture or focal lesion. Sinuses/Orbits: Paranasal sinuses and mastoid air cells are clear. The visualized orbits are unremarkable. Other: None. IMPRESSION: No acute intracranial abnormality. Electronically Signed   By: Jeb Levering M.D.   On: 12/28/2016 22:04   Mr Jeri Cos HA Contrast  Result Date: 12/29/2016 CLINICAL DATA:  Lower extremity weakness, recent viral illness. EXAM: MRI HEAD WITHOUT AND WITH CONTRAST TECHNIQUE: Multiplanar, multiecho pulse sequences of the brain and surrounding structures were obtained without and with intravenous contrast. CONTRAST:  62mL MULTIHANCE GADOBENATE DIMEGLUMINE 529 MG/ML IV SOLN COMPARISON:  CT HEAD December 28, 2016 and MRI of the head February 10, 2013 FINDINGS: INTRACRANIAL CONTENTS: No reduced diffusion to suggest acute ischemia or hyperacute demyelination. No susceptibility artifact to suggest hemorrhage. The ventricles and sulci are normal for patient's age. No suspicious parenchymal signal, masses, mass effect. No abnormal intraparenchymal or extra-axial enhancement. No abnormal extra-axial fluid collections. No extra-axial masses. VASCULAR: Normal major intracranial vascular flow voids present at skull base. SKULL AND UPPER CERVICAL SPINE: No abnormal sellar expansion. No suspicious calvarial bone marrow signal. Craniocervical junction maintained. SINUSES/ORBITS: The mastoid air-cells and included paranasal sinuses are well-aerated.The included ocular globes and orbital contents are non-suspicious. OTHER: Patient is edentulous. IMPRESSION: Normal MRI of the head with and without contrast. Electronically Signed   By: Elon Alas M.D.   On: 12/29/2016 14:13   Mr Lumbar Spine Wo Contrast  Result Date: 12/29/2016 CLINICAL DATA:  Progressive lower extremity weakness after recent viral illness. EXAM: MRI LUMBAR SPINE WITHOUT CONTRAST TECHNIQUE: Multiplanar, multisequence MR imaging of the lumbar spine was  performed. No intravenous contrast was administered. COMPARISON:  CT abdomen and pelvis October 26, 2015 FINDINGS: SEGMENTATION: For the purposes of this report, the last well-formed intervertebral disc will be reported as L5-S1. ALIGNMENT: Maintained lumbar lordosis. No malalignment. Broad dextroscoliosis apparent on the axial sequences. VERTEBRAE:Limited assessment due to thoracolumbar Harrington rods better demonstrated on prior CT. Vertebral bodies are intact. Intervertebral discs demonstrate normal morphology and signal characteristics. No abnormal bone marrow signal. CONUS MEDULLARIS: Conus medullaris and cauda equina predominately obscured due to hardware artifact. Cauda equina from L3-4 caudally is normal. PARASPINAL AND SOFT TISSUES: Characterized prevertebral and paraspinal soft tissues are nonacute. Mild paraspinal muscle atrophy and postoperative susceptibility artifact. DISC LEVELS: T12-L1 thru L2-3: Predominately obscured. No definite canal stenosis. L3-4: No disc bulge, canal stenosis nor neural foraminal narrowing. Mild facet arthropathy. L4-5: Small RIGHT subarticular disc protrusion. Mild facet arthropathy without canal stenosis or neural foraminal narrowing. L5-S1: No disc bulge, canal stenosis nor neural foraminal narrowing. Mild facet arthropathy. IMPRESSION: 1. Limited assessment due to the hardware artifact, thoracolumbar Harrington rods to the level of L2-3. 2. No canal stenosis or neural foraminal narrowing the L3-4 through L5-S1. Nondiagnostic evaluation of upper levels. Electronically Signed   By: Elon Alas M.D.   On: 12/29/2016 14:18     ASSESSMENT AND PLAN:   31 year old female with past medical history of fistula chronic stress disorder, peptic ulcer disease, depression, chronic fatigue syndrome, chronic back pain who presented to the hospital due to lower extremity weakness and difficulty walking.  1. Lower extremity weakness/difficulty walking-etiology unclear presently.  Suspected to be atypical GBS but unlikely as seen by Neurology and their suspicion is low. Patient had good reflexes bilaterally. - pt. Has had back surgery done in the past. MRI of the lumbar spine and MRI of the brain is negative for acute pathology. -Patient also complains  of urinary incontinence and we'll do a bladder scan and check a postvoid residual. -My clinical suspicion of this is all psychogenic in nature and unlikely to any acute medical pathology. - We'll get physical therapy evaluation.  2. GERD-continue Protonix.  3. History of PTSD-continue Topamax.  4. Depression-continue trazodone, Cymbalta.  5. History of chronic back pain-continue ketorolac.   All the records are reviewed and case discussed with Care Management/Social Worker. Management plans discussed with the patient, family and they are in agreement.  CODE STATUS: Full  DVT Prophylaxis: Lovenox  TOTAL TIME TAKING CARE OF THIS PATIENT: 30 minutes.   POSSIBLE D/C IN 1-2 DAYS, DEPENDING ON CLINICAL CONDITION.   Henreitta Leber M.D on 12/29/2016 at 2:31 PM  Between 7am to 6pm - Pager - [phone]  After 6pm go to [email or web address] - password [email or web address] [facility]  Office  [phone]  CC: Primary care physician; Lavera Guise, MD

## 2016-12-30 DIAGNOSIS — L8990 Pressure ulcer of unspecified site, unspecified stage: Secondary | ICD-10-CM | POA: Insufficient documentation

## 2016-12-30 LAB — HIV ANTIBODY (ROUTINE TESTING W REFLEX): HIV Screen 4th Generation wRfx: NONREACTIVE

## 2016-12-30 NOTE — Progress Notes (Signed)
Subjective: Patient reports no improvement.  Continues to have lower extremity weakness.    Objective: Current vital signs: BP 117/73 (BP Location: Left Arm)   Pulse 88   Temp 98.5 F (36.9 C) (Oral)   Resp 16   Ht 5\' 3"  (1.6 m)   Wt 93.9 kg (207 lb)   LMP 01/05/2016   SpO2 98%   BMI 36.67 kg/m  Vital signs in last 24 hours: Temp:  [98.2 F (36.8 C)-98.8 F (37.1 C)] 98.5 F (36.9 C) (09/26 0859) Pulse Rate:  [88-105] 88 (09/26 1045) Resp:  [16-18] 16 (09/26 0859) BP: (110-128)/(61-84) 117/73 (09/26 0859) SpO2:  [98 %-100 %] 98 % (09/26 1045)  Intake/Output from previous day: 09/25 0701 - 09/26 0700 In: 240 [P.O.:240] Out: 550 [Urine:550] Intake/Output this shift: No intake/output data recorded. Nutritional status: Diet regular Room service appropriate? Yes; Fluid consistency: Thin  Neurologic Exam: Mental Status: Alert, oriented, thought content appropriate.  Speech fluent without evidence of aphasia.  Able to follow 3 step commands without difficulty. Cranial Nerves: II: Discs flat bilaterally; Visual fields grossly normal, pupils equal, round, reactive to light and accommodation III,IV, VI: ptosis not present, extra-ocular motions intact bilaterally V,VII: smile symmetric, facial light touch sensation normal bilaterally VIII: hearing normal bilaterally IX,X: gag reflex present XI: bilateral shoulder shrug XII: midline tongue extension Motor: Able to stand with minimal assistance.  Once standing legs buckle.  Once asked to lock knees able to stand and able to stand on each leg individually without assistance.  Easily lowered body down to chair with no use of chair arms.   Sensory: Pinprick and light touch decreased in the left upper and lower extremity Deep Tendon Reflexes: 2+ and symmetric throughout  Lab Results: Basic Metabolic Panel:  Recent Labs Lab 12/26/16 2035 12/28/16 2129 12/29/16 0413  NA 139 141 141  K 3.4* 3.3* 3.8  CL 111 110 111  CO2 20*  22 23  GLUCOSE 117* 88 90  BUN 10 8 7   CREATININE 0.83 0.88 0.93  CALCIUM 8.9 9.0 9.1    Liver Function Tests: No results for input(s): AST, ALT, ALKPHOS, BILITOT, PROT, ALBUMIN in the last 168 hours. No results for input(s): LIPASE, AMYLASE in the last 168 hours. No results for input(s): AMMONIA in the last 168 hours.  CBC:  Recent Labs Lab 12/26/16 2035 12/28/16 2129 12/29/16 0413  WBC 7.2 9.2 8.7  HGB 12.4 12.7 12.4  HCT 36.9 38.7 37.4  MCV 71.6* 72.0* 73.5*  PLT 290 337 298    Cardiac Enzymes:  Recent Labs Lab 12/26/16 2035 12/28/16 2129  CKTOTAL  --  133  TROPONINI <0.03  --     Lipid Panel: No results for input(s): CHOL, TRIG, HDL, CHOLHDL, VLDL, LDLCALC in the last 168 hours.  CBG: No results for input(s): GLUCAP in the last 168 hours.  Microbiology: Results for orders placed or performed during the hospital encounter of 12/28/16  Surgical PCR screen     Status: None   Collection Time: 12/29/16  1:30 AM  Result Value Ref Range Status   MRSA, PCR NEGATIVE NEGATIVE Final   Staphylococcus aureus NEGATIVE NEGATIVE Final    Comment: (NOTE) The Xpert SA Assay (FDA approved for NASAL specimens in patients 8 years of age and older), is one component of a comprehensive surveillance program. It is not intended to diagnose infection nor to guide or monitor treatment.     Coagulation Studies:  Recent Labs  12/28/16 2153  LABPROT 13.6  INR  1.05    Imaging: Ct Head Wo Contrast  Result Date: 12/28/2016 CLINICAL DATA:  Altered level of consciousness. Generalized weakness and dizziness. EXAM: CT HEAD WITHOUT CONTRAST TECHNIQUE: Contiguous axial images were obtained from the base of the skull through the vertex without intravenous contrast. COMPARISON:  Head CT 09/09/2016 FINDINGS: Brain: No intracranial hemorrhage, mass effect, or midline shift. No hydrocephalus. The basilar cisterns are patent. No evidence of territorial infarct or acute ischemia. No  extra-axial or intracranial fluid collection. Vascular: No hyperdense vessel. Skull: No fracture or focal lesion. Sinuses/Orbits: Paranasal sinuses and mastoid air cells are clear. The visualized orbits are unremarkable. Other: None. IMPRESSION: No acute intracranial abnormality. Electronically Signed   By: Jeb Levering M.D.   On: 12/28/2016 22:04   Mr Jeri Cos ZY Contrast  Result Date: 12/29/2016 CLINICAL DATA:  Lower extremity weakness, recent viral illness. EXAM: MRI HEAD WITHOUT AND WITH CONTRAST TECHNIQUE: Multiplanar, multiecho pulse sequences of the brain and surrounding structures were obtained without and with intravenous contrast. CONTRAST:  18mL MULTIHANCE GADOBENATE DIMEGLUMINE 529 MG/ML IV SOLN COMPARISON:  CT HEAD December 28, 2016 and MRI of the head February 10, 2013 FINDINGS: INTRACRANIAL CONTENTS: No reduced diffusion to suggest acute ischemia or hyperacute demyelination. No susceptibility artifact to suggest hemorrhage. The ventricles and sulci are normal for patient's age. No suspicious parenchymal signal, masses, mass effect. No abnormal intraparenchymal or extra-axial enhancement. No abnormal extra-axial fluid collections. No extra-axial masses. VASCULAR: Normal major intracranial vascular flow voids present at skull base. SKULL AND UPPER CERVICAL SPINE: No abnormal sellar expansion. No suspicious calvarial bone marrow signal. Craniocervical junction maintained. SINUSES/ORBITS: The mastoid air-cells and included paranasal sinuses are well-aerated.The included ocular globes and orbital contents are non-suspicious. OTHER: Patient is edentulous. IMPRESSION: Normal MRI of the head with and without contrast. Electronically Signed   By: Elon Alas M.D.   On: 12/29/2016 14:13   Mr Lumbar Spine Wo Contrast  Result Date: 12/29/2016 CLINICAL DATA:  Progressive lower extremity weakness after recent viral illness. EXAM: MRI LUMBAR SPINE WITHOUT CONTRAST TECHNIQUE: Multiplanar,  multisequence MR imaging of the lumbar spine was performed. No intravenous contrast was administered. COMPARISON:  CT abdomen and pelvis October 26, 2015 FINDINGS: SEGMENTATION: For the purposes of this report, the last well-formed intervertebral disc will be reported as L5-S1. ALIGNMENT: Maintained lumbar lordosis. No malalignment. Broad dextroscoliosis apparent on the axial sequences. VERTEBRAE:Limited assessment due to thoracolumbar Harrington rods better demonstrated on prior CT. Vertebral bodies are intact. Intervertebral discs demonstrate normal morphology and signal characteristics. No abnormal bone marrow signal. CONUS MEDULLARIS: Conus medullaris and cauda equina predominately obscured due to hardware artifact. Cauda equina from L3-4 caudally is normal. PARASPINAL AND SOFT TISSUES: Characterized prevertebral and paraspinal soft tissues are nonacute. Mild paraspinal muscle atrophy and postoperative susceptibility artifact. DISC LEVELS: T12-L1 thru L2-3: Predominately obscured. No definite canal stenosis. L3-4: No disc bulge, canal stenosis nor neural foraminal narrowing. Mild facet arthropathy. L4-5: Small RIGHT subarticular disc protrusion. Mild facet arthropathy without canal stenosis or neural foraminal narrowing. L5-S1: No disc bulge, canal stenosis nor neural foraminal narrowing. Mild facet arthropathy. IMPRESSION: 1. Limited assessment due to the hardware artifact, thoracolumbar Harrington rods to the level of L2-3. 2. No canal stenosis or neural foraminal narrowing the L3-4 through L5-S1. Nondiagnostic evaluation of upper levels. Electronically Signed   By: Elon Alas M.D.   On: 12/29/2016 14:18    Medications:  I have reviewed the patient's current medications. Scheduled: . DULoxetine  30 mg Oral Daily  .  mouth rinse  15 mL Mouth Rinse BID  . norethindrone  5 mg Oral Daily  . pantoprazole  40 mg Oral BID  . tamsulosin  0.4 mg Oral Daily  . topiramate  25 mg Oral Daily  . traZODone  50  mg Oral QHS    Assessment/Plan: Patient unchanged but symptoms not progressive.  Exam somewhat inconsistent but remain concerned about urinary retention.  Patient has maintained DTR's.  TSH only mildly elevated.  MRI of the brain is unremarkable.  MRI of the lumbar spine is suboptimal due to artifact from hardware.  Remain concerned about possibility of cord pathology.    Recommendations: 1.  Case discussed with radiology.  Patient to have myelogram.   2.  Myasthenia panel   LOS: 2 days   Alexis Goodell, MD Neurology [phone] 12/30/2016  11:29 AM

## 2016-12-30 NOTE — Progress Notes (Signed)
[facility] NAME: Teresa Crawford    MR#:  [id number]  DATE OF BIRTH:  17-Aug-1985  SUBJECTIVE:   Patient here due to lower extremity weakness and difficulty walking. Patient had urinary retention yesterday and her postvoid residual was greater than 400. Started on Flomax yesterday and voiding a bit better today and postvoid residual was only 70 today. Still complaining of significant left lower extremity weakness and difficulty walking. Seen by physical therapy and recommended short-term rehabilitation. MRI of her lumbar and brain negative for any acute pathology. Discussed with neurology Dr. Doy Mince and plan for myelogram for tomorrow.  REVIEW OF SYSTEMS:    Review of Systems  Constitutional: Negative for chills and fever.  HENT: Negative for congestion and tinnitus.   Eyes: Negative for blurred vision and double vision.  Respiratory: Negative for cough, shortness of breath and wheezing.   Cardiovascular: Negative for chest pain, orthopnea and PND.  Gastrointestinal: Negative for abdominal pain, diarrhea, nausea and vomiting.  Genitourinary: Negative for dysuria and hematuria.  Neurological: Positive for weakness. Negative for dizziness, sensory change and focal weakness.  All other systems reviewed and are negative.   Nutrition: Regular Tolerating Diet: Yes Tolerating PT: Await Eval.   DRUG ALLERGIES:   Allergies  Allergen Reactions  . Peanut Butter Flavor Swelling    Throat swells  . Codeine Nausea And Vomiting    nausea  . Paroxetine     Patient states the cause of a panic attack but denies an actual physical injury.  . Paxil [Paroxetine Hcl] Other (See Comments)    Panic attacks  . Venlafaxine     Patient states it causes a panic attack but denies it ever caused a physical manifestation  . Zoloft [Sertraline Hcl] Other (See Comments)    Panic attacks    VITALS:  Blood pressure 117/73, pulse 88, temperature 98.5 F  (36.9 C), temperature source Oral, resp. rate 16, height 5\' 3"  (1.6 m), weight 93.9 kg (207 lb), last menstrual period 01/05/2016, SpO2 98 %.  PHYSICAL EXAMINATION:   Physical Exam  GENERAL:  31 y.o.-year-old patient lying in bed in no acute distress.  EYES: Pupils equal, round, reactive to light and accommodation. No scleral icterus. Extraocular muscles intact.  HEENT: Head atraumatic, normocephalic. Oropharynx and nasopharynx clear.  NECK:  Supple, no jugular venous distention. No thyroid enlargement, no tenderness.  LUNGS: Normal breath sounds bilaterally, no wheezing, rales, rhonchi. No use of accessory muscles of respiration.  CARDIOVASCULAR: S1, S2 normal. No murmurs, rubs, or gallops.  ABDOMEN: Soft, nontender, nondistended. Bowel sounds present. No organomegaly or mass.  EXTREMITIES: No cyanosis, clubbing or edema b/l.    NEUROLOGIC: Cranial nerves II through XII are intact. No focal Motor or sensory deficits b/l.  LLE weakness. +2 reflexes b/l.  PSYCHIATRIC: The patient is alert and oriented x 3.  SKIN: No obvious rash, lesion, or ulcer.    LABORATORY PANEL:   CBC  Recent Labs Lab 12/29/16 0413  WBC 8.7  HGB 12.4  HCT 37.4  PLT 298   ------------------------------------------------------------------------------------------------------------------  Chemistries   Recent Labs Lab 12/29/16 0413  NA 141  K 3.8  CL 111  CO2 23  GLUCOSE 90  BUN 7  CREATININE 0.93  CALCIUM 9.1   ------------------------------------------------------------------------------------------------------------------  Cardiac Enzymes  Recent Labs Lab 12/26/16 2035  TROPONINI <0.03   ------------------------------------------------------------------------------------------------------------------  RADIOLOGY:  Ct Head Wo Contrast  Result Date: 12/28/2016 CLINICAL DATA:  Altered level of consciousness. Generalized weakness  and dizziness. EXAM: CT HEAD WITHOUT CONTRAST TECHNIQUE:  Contiguous axial images were obtained from the base of the skull through the vertex without intravenous contrast. COMPARISON:  Head CT 09/09/2016 FINDINGS: Brain: No intracranial hemorrhage, mass effect, or midline shift. No hydrocephalus. The basilar cisterns are patent. No evidence of territorial infarct or acute ischemia. No extra-axial or intracranial fluid collection. Vascular: No hyperdense vessel. Skull: No fracture or focal lesion. Sinuses/Orbits: Paranasal sinuses and mastoid air cells are clear. The visualized orbits are unremarkable. Other: None. IMPRESSION: No acute intracranial abnormality. Electronically Signed   By: Jeb Levering M.D.   On: 12/28/2016 22:04   Mr Jeri Cos JJ Contrast  Result Date: 12/29/2016 CLINICAL DATA:  Lower extremity weakness, recent viral illness. EXAM: MRI HEAD WITHOUT AND WITH CONTRAST TECHNIQUE: Multiplanar, multiecho pulse sequences of the brain and surrounding structures were obtained without and with intravenous contrast. CONTRAST:  28mL MULTIHANCE GADOBENATE DIMEGLUMINE 529 MG/ML IV SOLN COMPARISON:  CT HEAD December 28, 2016 and MRI of the head February 10, 2013 FINDINGS: INTRACRANIAL CONTENTS: No reduced diffusion to suggest acute ischemia or hyperacute demyelination. No susceptibility artifact to suggest hemorrhage. The ventricles and sulci are normal for patient's age. No suspicious parenchymal signal, masses, mass effect. No abnormal intraparenchymal or extra-axial enhancement. No abnormal extra-axial fluid collections. No extra-axial masses. VASCULAR: Normal major intracranial vascular flow voids present at skull base. SKULL AND UPPER CERVICAL SPINE: No abnormal sellar expansion. No suspicious calvarial bone marrow signal. Craniocervical junction maintained. SINUSES/ORBITS: The mastoid air-cells and included paranasal sinuses are well-aerated.The included ocular globes and orbital contents are non-suspicious. OTHER: Patient is edentulous. IMPRESSION: Normal  MRI of the head with and without contrast. Electronically Signed   By: Elon Alas M.D.   On: 12/29/2016 14:13   Mr Lumbar Spine Wo Contrast  Result Date: 12/29/2016 CLINICAL DATA:  Progressive lower extremity weakness after recent viral illness. EXAM: MRI LUMBAR SPINE WITHOUT CONTRAST TECHNIQUE: Multiplanar, multisequence MR imaging of the lumbar spine was performed. No intravenous contrast was administered. COMPARISON:  CT abdomen and pelvis October 26, 2015 FINDINGS: SEGMENTATION: For the purposes of this report, the last well-formed intervertebral disc will be reported as L5-S1. ALIGNMENT: Maintained lumbar lordosis. No malalignment. Broad dextroscoliosis apparent on the axial sequences. VERTEBRAE:Limited assessment due to thoracolumbar Harrington rods better demonstrated on prior CT. Vertebral bodies are intact. Intervertebral discs demonstrate normal morphology and signal characteristics. No abnormal bone marrow signal. CONUS MEDULLARIS: Conus medullaris and cauda equina predominately obscured due to hardware artifact. Cauda equina from L3-4 caudally is normal. PARASPINAL AND SOFT TISSUES: Characterized prevertebral and paraspinal soft tissues are nonacute. Mild paraspinal muscle atrophy and postoperative susceptibility artifact. DISC LEVELS: T12-L1 thru L2-3: Predominately obscured. No definite canal stenosis. L3-4: No disc bulge, canal stenosis nor neural foraminal narrowing. Mild facet arthropathy. L4-5: Small RIGHT subarticular disc protrusion. Mild facet arthropathy without canal stenosis or neural foraminal narrowing. L5-S1: No disc bulge, canal stenosis nor neural foraminal narrowing. Mild facet arthropathy. IMPRESSION: 1. Limited assessment due to the hardware artifact, thoracolumbar Harrington rods to the level of L2-3. 2. No canal stenosis or neural foraminal narrowing the L3-4 through L5-S1. Nondiagnostic evaluation of upper levels. Electronically Signed   By: Elon Alas M.D.   On:  12/29/2016 14:18     ASSESSMENT AND PLAN:   31 year old female with past medical history of fistula chronic stress disorder, peptic ulcer disease, depression, chronic fatigue syndrome, chronic back pain who presented to the hospital due to lower extremity weakness and difficulty walking.  1. Lower extremity weakness/difficulty walking-etiology unclear presently. Suspected to be atypical GBS but unlikely as seen by Neurology and their suspicion is low. Patient had good reflexes bilaterally. - pt. Has had back surgery done in the past. MRI of the lumbar spine and MRI of the brain is negative for acute pathology. - pt. Having some Urinary Retention and started on Flomax and it has improved.  - Seen by physical therapy and recommended short-term rehabilitation. Discussed care with neurology after imaging done yesterday and we'll get a myelogram of the cervical thoracic and lumbar spine to further look for the source of her weakness. -Continue supportive care for now.  2. GERD-continue Protonix.  3. History of PTSD-continue Topamax.  4. Depression-continue trazodone, Cymbalta.  5. History of chronic back pain-continue ketorolac.  6. Urinary retention-continue Flomax. Follow postvoid residuals. - Etiology unclear presently.  Discussed plan of are with Neurology.   All the records are reviewed and case discussed with Care Management/Social Worker. Management plans discussed with the patient, family and they are in agreement.  CODE STATUS: Full  DVT Prophylaxis: Lovenox  TOTAL TIME TAKING CARE OF THIS PATIENT: 30 minutes.   POSSIBLE D/C IN 1-2 DAYS, DEPENDING ON CLINICAL CONDITION.   Henreitta Leber M.D on 12/30/2016 at 1:55 PM  Between 7am to 6pm - Pager - [phone]  After 6pm go to [email or web address] - password [email or web address]  Office  [phone]  CC: Primary care physician; Lavera Guise, MD

## 2016-12-30 NOTE — Progress Notes (Signed)
Clinical Social Worker (CSW) met with patient and explained that PT is recommending SNF. CSW explained that medicare will pay for SNF once patient has a 3 night inpatient qualifying stay at [facility]. Patient was admitted to inpatient on 12/28/16. Patient is agreeable to SNF search in [facility]. FL2 complete and faxed out. PASARR is pending.   CSW presented bed offers to patient and she chose [facility]. Doug admissions coordinator at [facility] is aware of above. CSW contacted patient's community case [place] with patient's permission and made her aware of above. CSW will continue to follow and assist as needed.   [employer], LCSW [phone]

## 2016-12-30 NOTE — OR Nursing (Signed)
Primary care nurse notified that rad nurse called yesterday and informed primary nurse that md needed to be informed that lovenox needs to be held one dose prior to LP, so the LP was reschedule for today. The Lovenox was given again last pm and now the Lp will be delayed another day.

## 2016-12-30 NOTE — Evaluation (Signed)
Physical Therapy Evaluation Patient Details Name: Teresa Crawford MRN: [id number] DOB: 07/26/1985 Today's Date: 12/30/2016   History of Present Illness  Pt is a 31 year old female with past medical history of fistula, chronic stress disorder, peptic ulcer disease, depression, chronic fatigue syndrome, chronic back pain who presented to the hospital due to lower extremity weakness and difficulty walking.  Assessment includes: Lower extremity weakness/difficulty walking-etiology unclear presently, suspected to be atypical GBS but unlikely as seen by Neurology and their suspicion is low; GERD, and PTSD.    Clinical Impression  Pt presents with deficits in strength, transfers, gait, balance, and activity tolerance.  Pt Ind with all bed mobility tasks without extra time or effort required.  Pt required CGA with transfers with mod verbal cues for hand placement with pt unable to stand without UE assist.  Pt leans to stronger R side during sit to stand and upon initial stand LLE remains with knee somewhat flexed with pt leaning on RLE.   Pt able to amb 1-2' x 1 and then 4-5' x 1 with RW and CGA.  Pt required mod verbal cues for step-to sequencing and reports feeling as if would fall without BUE assist from the walker.  Pt's HR increased from 88 bpm to 125 bpm after amb 1-2 small steps and returned to upper 90's upon sitting briefly.  HR then increased to mid 140's after amb 4-5 feet, MD notified.  Pt will benefit from PT services in a SNF setting upon discharge from acute care to safely address above deficits for decreased caregiver assistance and return to PLOF.      Follow Up Recommendations SNF    Equipment Recommendations  Rolling walker with 5" wheels    Recommendations for Other Services       Precautions / Restrictions Precautions Precautions: Fall Restrictions Weight Bearing Restrictions: No      Mobility  Bed Mobility Overal bed mobility: Independent                 Transfers Overall transfer level: Needs assistance Equipment used: Rolling walker (2 wheeled) Transfers: Sit to/from Stand Sit to Stand: Min guard         General transfer comment: Mod verbal cues for hand placement with pt unable to stand without UE assist.  Pt leans to stronger R side during sit to stand and upon initial stand LLE remains with knee somewhat flexed with pt leaning on RLE.   Ambulation/Gait Ambulation/Gait assistance: Min guard Ambulation Distance (Feet): 5 Feet Assistive device: Rolling walker (2 wheeled) Gait Pattern/deviations: Step-to pattern;Decreased step length - right;Decreased stance time - left   Gait velocity interpretation: <1.8 ft/sec, indicative of risk for recurrent falls General Gait Details: Pt required mod verbal cues for step-to sequencing and reports feeling as if would fall without BUE assist from the walker.  Pt's HR increased from 88 bpm to 125 bpm after amb 1-2 small steps and returned to upper 90's upon sitting briefly.  HR then increased to mid 140's after amb 4-5 feet, MD notified.    Stairs            Wheelchair Mobility    Modified Rankin (Stroke Patients Only)       Balance Overall balance assessment: Needs assistance Sitting-balance support: Feet unsupported;Feet supported;No upper extremity supported;Bilateral upper extremity supported Sitting balance-Leahy Scale: Normal     Standing balance support: Bilateral upper extremity supported Standing balance-Leahy Scale: Fair  Pertinent Vitals/Pain Pain Assessment: No/denies pain    Home Living Family/patient expects to be discharged to:: Group home                      Prior Function Level of Independence: Independent         Comments: Ind Amb without AD with no fall history prior to recent onset of weakness     Hand Dominance   Dominant Hand: Left    Extremity/Trunk Assessment   Upper Extremity  Assessment Upper Extremity Assessment: Overall WFL for tasks assessed;LUE deficits/detail LUE Sensation: decreased light touch;decreased proprioception    Lower Extremity Assessment Lower Extremity Assessment: Generalized weakness;LLE deficits/detail LLE Deficits / Details: RLE grossly 5/5 with LLE grossly 4/5 but functional strength deficits present LLE Sensation: decreased light touch;decreased proprioception       Communication   Communication: No difficulties  Cognition Arousal/Alertness: Awake/alert Behavior During Therapy: WFL for tasks assessed/performed Overall Cognitive Status: Within Functional Limits for tasks assessed                                 General Comments: Documented mild cognitive deficits at baseline       General Comments      Exercises Total Joint Exercises Ankle Circles/Pumps: AROM;Both;10 reps;5 reps Quad Sets: Strengthening;Both;5 reps;10 reps Gluteal Sets: Strengthening;Both;5 reps;10 reps Long Arc Quad: Both;10 reps;Strengthening Knee Flexion: Strengthening;Both;10 reps Marching in Standing: AROM;Both;5 reps (With BUE support on RW) Other Exercises Other Exercises: B seated hip flex x 10 Other Exercises: HEP education/review with BLE APs, QS, GS, knee flex x 10 each 5-6x/day   Assessment/Plan    PT Assessment Patient needs continued PT services  PT Problem List Decreased strength;Decreased activity tolerance;Decreased balance;Decreased knowledge of use of DME       PT Treatment Interventions DME instruction;Gait training;Functional mobility training;Neuromuscular re-education;Balance training;Therapeutic exercise;Therapeutic activities;Patient/family education    PT Goals (Current goals can be found in the Care Plan section)  Acute Rehab PT Goals Patient Stated Goal: To get stronger and walk again PT Goal Formulation: With patient Time For Goal Achievement: 01/12/17 Potential to Achieve Goals: Good    Frequency Min  2X/week   Barriers to discharge Decreased caregiver support      Co-evaluation               AM-PAC PT "6 Clicks" Daily Activity  Outcome Measure Difficulty turning over in bed (including adjusting bedclothes, sheets and blankets)?: None Difficulty moving from lying on back to sitting on the side of the bed? : None Difficulty sitting down on and standing up from a chair with arms (e.g., wheelchair, bedside commode, etc,.)?: Unable Help needed moving to and from a bed to chair (including a wheelchair)?: A Little Help needed walking in hospital room?: A Lot Help needed climbing 3-5 steps with a railing? : A Lot 6 Click Score: 16    End of Session Equipment Utilized During Treatment: Gait belt Activity Tolerance: Patient limited by fatigue Patient left: in chair;with chair alarm set;with call bell/phone within reach Nurse Communication: Mobility status;Other (comment) (Chair alarm beeping intermittently, possible low battery) PT Visit Diagnosis: Difficulty in walking, not elsewhere classified (R26.2);Muscle weakness (generalized) (M62.81)    Time: 1610-9604 PT Time Calculation (min) (ACUTE ONLY): 39 min   Charges:   PT Evaluation $PT Eval Low Complexity: 1 Low PT Treatments $Therapeutic Exercise: 8-22 mins   PT G Codes:   PT  G-Codes **NOT FOR INPATIENT CLASS** Functional Assessment Tool Used: AM-PAC 6 Clicks Basic Mobility Functional Limitation: Mobility: Walking and moving around Mobility: Walking and Moving Around Current Status [phone]): At least 40 percent but less than 60 percent impaired, limited or restricted Mobility: Walking and Moving Around Goal Status [phone]): At least 1 percent but less than 20 percent impaired, limited or restricted    D. Scott Talon Witting PT, DPT 12/30/16, 11:10 AM

## 2016-12-30 NOTE — Clinical Social Work Placement (Signed)
   CLINICAL SOCIAL WORK PLACEMENT  NOTE  Date:  12/30/2016  Patient Details  Name: Teresa Crawford MRN: [id number] Date of Birth: 28-Apr-1985  Clinical Social Work is seeking post-discharge placement for this patient at the [place] level of care (*CSW will initial, date and re-position this form in  chart as items are completed):  Yes   Patient/family provided with [place] Work Department's list of facilities offering this level of care within the geographic area requested by the patient (or if unable, by the patient's family).  Yes   Patient/family informed of their freedom to choose among providers that offer the needed level of care, that participate in Medicare, Medicaid or managed care program needed by the patient, have an available bed and are willing to accept the patient.  Yes   Patient/family informed of [facility]'s ownership interest in [facility] and [facility], as well as of the fact that they are under no obligation to receive care at these facilities.  PASRR submitted to EDS on 12/30/16     PASRR number received on       Existing PASRR number confirmed on       FL2 transmitted to all facilities in geographic area requested by pt/family on 12/30/16     FL2 transmitted to all facilities within larger geographic area on       Patient informed that his/her managed care company has contracts with or will negotiate with certain facilities, including the following:        Yes   Patient/family informed of bed offers received.  Patient chooses bed at  [facility] )     Physician recommends and patient chooses bed at      Patient to be transferred to   on  .  Patient to be transferred to facility by       Patient family notified on   of transfer.  Name of family member notified:        PHYSICIAN       Additional Comment:    _______________________________________________ [name], [name], LCSW 12/30/2016,  2:24 PM

## 2016-12-30 NOTE — Progress Notes (Signed)
Chaplain made a follow up visit with pt. Pt appeared down but hopeful. Pt states she has had several tests but still doesn't know what she is suffering from. [place] encouraged pt, provided a ministry of compassion and presence. [place] will follow up with pt as needed.   12/30/16 1300  Clinical Encounter Type  Visited With Patient  Visit Type Follow-up  Referral From [place] (Comment)

## 2016-12-31 ENCOUNTER — Inpatient Hospital Stay: Payer: Medicare Other

## 2016-12-31 LAB — ACETYLCHOLINE RECEPTOR, BINDING

## 2016-12-31 LAB — STRIATED MUSCLE ANTIBODY: Anti-striation Abs: NEGATIVE

## 2016-12-31 LAB — ANTI-SMOOTH MUSCLE ANTIBODY, IGG: F-Actin IgG: 11 Units (ref 0–19)

## 2016-12-31 MED ORDER — LIDOCAINE HCL (PF) 1 % IJ SOLN
5.0000 mL | Freq: Once | INTRAMUSCULAR | Status: DC
  Filled 2016-12-31: qty 5

## 2016-12-31 MED ORDER — TAMSULOSIN HCL 0.4 MG PO CAPS: 0.4000 mg | ORAL_CAPSULE | Freq: Every day | ORAL | 0 refills | Status: DC

## 2016-12-31 MED ORDER — DEXTROSE 5 % IV SOLN
1.0000 g | INTRAVENOUS | Status: DC
  Administered 2016-12-31 – 2017-01-01 (×2): 1 g via INTRAVENOUS
  Filled 2016-12-31 (×3): qty 10

## 2016-12-31 MED ORDER — IOPAMIDOL (ISOVUE-M 300) INJECTION 61%
15.0000 mL | Freq: Once | INTRAMUSCULAR | Status: AC | PRN
  Administered 2016-12-31: 10 mL via INTRATHECAL

## 2016-12-31 MED ORDER — DIAZEPAM 5 MG PO TABS: 5.0000 mg | ORAL_TABLET | Freq: Every day | ORAL | 0 refills | Status: DC

## 2016-12-31 MED ORDER — TRAMADOL HCL 50 MG PO TABS: 100.0000 mg | ORAL_TABLET | Freq: Four times a day (QID) | ORAL | 0 refills | Status: DC | PRN

## 2016-12-31 NOTE — Progress Notes (Signed)
[facility] [place] NAME: Teresa Crawford    MR#:  [id number]  DATE OF BIRTH:  April 19, 1985  SUBJECTIVE:   Still has weakness of lower extremities. Had Ct myelogram earlier  REVIEW OF SYSTEMS:    Review of Systems  Constitutional: Negative for chills and fever.  HENT: Negative for congestion and tinnitus.   Eyes: Negative for blurred vision and double vision.  Respiratory: Negative for cough, shortness of breath and wheezing.   Cardiovascular: Negative for chest pain, orthopnea and PND.  Gastrointestinal: Negative for abdominal pain, diarrhea, nausea and vomiting.  Genitourinary: Negative for dysuria and hematuria.  Neurological: Positive for weakness. Negative for dizziness, sensory change and focal weakness.  All other systems reviewed and are negative.   Nutrition: Regular Tolerating Diet: Yes Tolerating PT: yes  DRUG ALLERGIES:   Allergies  Allergen Reactions  . Peanut Butter Flavor Swelling    Throat swells  . Codeine Nausea And Vomiting    nausea  . Paroxetine     Patient states the cause of a panic attack but denies an actual physical injury.  . Paxil [Paroxetine Hcl] Other (See Comments)    Panic attacks  . Venlafaxine     Patient states it causes a panic attack but denies it ever caused a physical manifestation  . Zoloft [Sertraline Hcl] Other (See Comments)    Panic attacks    VITALS:  Blood pressure 102/60, pulse 99, temperature 98.8 F (37.1 C), temperature source Oral, resp. rate 19, height 5\' 3"  (1.6 m), weight 93.9 kg (207 lb), last menstrual period 01/05/2016, SpO2 100 %.  PHYSICAL EXAMINATION:   Physical Exam  GENERAL:  31 y.o.-year-old patient lying in bed in no acute distress.  EYES: Pupils equal, round, reactive to light and accommodation. No scleral icterus. Extraocular muscles intact.  HEENT: Head atraumatic, normocephalic. Oropharynx and nasopharynx clear.  NECK:  Supple, no jugular venous  distention. No thyroid enlargement, no tenderness.  LUNGS: Normal breath sounds bilaterally, no wheezing, rales, rhonchi. No use of accessory muscles of respiration.  CARDIOVASCULAR: S1, S2 normal. No murmurs, rubs, or gallops.  ABDOMEN: Soft, nontender, nondistended. Bowel sounds present. No organomegaly or mass.  EXTREMITIES: No cyanosis, clubbing or edema b/l.    NEUROLOGIC: Cranial nerves II through XII are intact. No focal Motor or sensory deficits b/l.  LLE weakness. +2 reflexes b/l.  PSYCHIATRIC: The patient is alert and oriented x 3.  SKIN: No obvious rash, lesion, or ulcer.    LABORATORY PANEL:   CBC  Recent Labs Lab 12/29/16 0413  WBC 8.7  HGB 12.4  HCT 37.4  PLT 298   ------------------------------------------------------------------------------------------------------------------  Chemistries   Recent Labs Lab 12/29/16 0413  NA 141  K 3.8  CL 111  CO2 23  GLUCOSE 90  BUN 7  CREATININE 0.93  CALCIUM 9.1   ------------------------------------------------------------------------------------------------------------------  Cardiac Enzymes  Recent Labs Lab 12/26/16 2035  TROPONINI <0.03   ------------------------------------------------------------------------------------------------------------------  RADIOLOGY:  Dg Myelogram 2+ Regions  Result Date: 12/31/2016 CLINICAL DATA:  Fall 2 days ago. Lower extremity weakness. Unable to stand. FLUOROSCOPY TIME:  dictate in minuets and seconds PROCEDURE: LUMBAR PUNCTURE FOR CERVICAL LUMBAR AND THORACIC MYELOGRAM CERVICAL AND LUMBAR AND THORACIC MYELOGRAM CT CERVICAL MYELOGRAM CT LUMBAR MYELOGRAM CT THORACIC MYELOGRAM After thorough discussion of risks and benefits of the procedure including bleeding, infection, injury to nerves, blood vessels, adjacent structures as well as headache and CSF leak, written and oral informed consent was obtained. Consent was obtained  by Dr. Marcello Moores Register. Patient was positioned prone  on the fluoroscopy table. Local anesthesia was provided with 1% lidocaine without epinephrine after prepped and draped in the usual sterile fashion. Puncture was performed at the L4-L5 using a 3 1/2 inch 22-gauge spinal needle via _ left _ paramedian approach. Using a single pass through the dura, the needle was placed within the thecal sac, with return of clear CSF. 10 mL Isovue-M 300 was injected into the thecal sac, with normal opacification of the nerve roots and cauda equina consistent with free flow within the subarachnoid space. The patient was then moved to the trendelenburg position and contrast flowed into the Thoracic and Cervical spine regions. I personally performed the lumbar puncture and administered the intrathecal contrast. I also personally supervised acquisition of the myelogram images. Finding procedure hemostasis achieved. Patient sent to CT for further imaging. No complications. TECHNIQUE: Contiguous axial images were obtained through the Cervical, Thoracic, and Lumbar spine after the intrathecal infusion of infusion. Coronal and sagittal reconstructions were obtained of the axial image sets. FINDINGS: CERVICAL/ THORACIC, AND LUMBAR MYELOGRAM FINDINGS: The lumbar/thoracic/ cervical thecal sac appear to be widely patent. Reference is made to postmyelogram CT findings for more detailed results. CT CERVICAL MYELOGRAM FINDINGS: Reference is made to CT report . CT LUMBAR MYELOGRAM FINDINGS: Reference is made to CT report . CT THORACIC MYELOGRAM FINDINGS: Reference is made to CT report. IMPRESSION: Successful lumbar/ thoracic/cervical myelogram. Wide patency of the thecal sac noted. Reference is made to postmyelogram CT report for more detailed findings. Electronically Signed   By: Marcello Moores  Register   On: 12/31/2016 10:14     ASSESSMENT AND PLAN:   31 year old female with past medical history of fistula chronic stress disorder, peptic ulcer disease, depression, chronic fatigue syndrome, chronic  back pain who presented to the hospital due to lower extremity weakness and difficulty walking.  1. Lower extremity weakness/difficulty walking-etiology unclear presently. Suspected to be atypical GBS but unlikely as seen by Neurology and their suspicion is low. Patient had good reflexes bilaterally. - pt. Has had back surgery done in the past. MRI of the lumbar spine and MRI of the brain is negative for acute pathology. - pt. Having some Urinary Retention and started on Flomax and it has improved.  - Seen by physical therapy and recommended short-term rehabilitation.  CT myelogram done and results pending -Continue supportive care  - Likely d/c to SNF tomorrow if CT myelogram negative  2. GERD-continue Protonix.  3. History of PTSD-continue Topamax.  4. Depression-continue trazodone, Cymbalta.  5. History of chronic back pain-continue ketorolac.  6. Urinary retention-continue Flomax. Follow postvoid residuals. - Etiology unclear presently.  Discussed with Dr. Doy Mince  All the records are reviewed and case discussed with Care Management/Social Worker. Management plans discussed with the patient, family and they are in agreement.  CODE STATUS: Full  DVT Prophylaxis: Lovenox  TOTAL TIME TAKING CARE OF THIS PATIENT: 30 minutes.   POSSIBLE D/C IN 1-2 DAYS, DEPENDING ON CLINICAL CONDITION.   Hillary Bow R M.D on 12/31/2016 at 5:40 PM  Between 7am to 6pm - Pager - [phone]  After 6pm go to [email or web address] - password [email or web address] [facility]  Office  [phone]  CC: Primary care physician; Lavera Guise, MD

## 2016-12-31 NOTE — Progress Notes (Signed)
Pt. Stable after myelogram.VSS.Back stable F/U with her M.D.

## 2016-12-31 NOTE — Discharge Summary (Signed)
[facility] [place] NAME: Teresa Crawford    MR#:  [id number]  DATE OF BIRTH:  1985-11-22  DATE OF ADMISSION:  12/28/2016 ADMITTING PHYSICIAN: Lance Coon, MD  DATE OF DISCHARGE: 01/01/2017  3:30 PM  PRIMARY CARE PHYSICIAN: Lavera Guise, MD   ADMISSION DIAGNOSIS:  Weakness [R53.1] Lower extremity weakness [R29.898]  DISCHARGE DIAGNOSIS:  Principal Problem:   Lower extremity weakness Active Problems:   Depression, major, recurrent (HCC)   Intellectual disability   Scoliosis   Hypothyroidism   Pressure injury of skin   SECONDARY DIAGNOSIS:   Past Medical History:  Diagnosis Date  . Chronic back pain   . Chronic fatigue syndrome   . Depression   . Elevated liver enzymes   . Endometriosis   . Hepatic steatosis   . Hypothyroid   . Last menstrual period (LMP) > 10 days ago 09/12/15  . Mental retardation   . Migraine   . Posttraumatic stress disorder   . PUD (peptic ulcer disease)   . Scoliosis   . Splenomegaly   . Uterine fibroid      ADMITTING HISTORY  HISTORY OF PRESENT ILLNESS:  Teresa Crawford  is a 31 y.o. female who presents with lateral lower extremity weakness. Patient states that she's had some viral illness over the past week or so, which include a risk for symptoms but also GI upset and nausea with vomiting. Over the last several days she's gotten progressively weaker in her lower extremities, and like her legs give way when she stands up, and reporting that she actually had to crawl around her house due to being so weak. At that point she came in for evaluation again. There is some concern initially by ED provider for possible atypical Guillain-Barr, lumbar puncture was attempted but unsuccessful. Hospitalists were called for admission and further evaluation   HOSPITAL COURSE:   31 year old female with past medical history of fistula chronic stress disorder, peptic ulcer disease, depression, chronic fatigue  syndrome, chronic back pain who presented to the hospital due to lower extremity weakness and difficulty walking.  1. Lower extremity weakness/difficulty walking-etiology unclear presently. Suspected to be atypical GBS but unlikely as seen by Neurology and their suspicion is low. Patient had good reflexes bilaterally. - pt. Has had back surgery done in the past. MRI of the lumbar spine and MRI of the brain is negative for acute pathology. - pt. Having some Urinary Retention and started on Flomax and it has improved.  - Seen by physical therapy and recommended short-term rehabilitation.  -Continue supportive care for now. - CT myelogram results Normal -  Initially patient was thought to be a candidate to go to skilled nursing facility.  She improved well with her weakness on the day of discharge she walked 400 feet with a walker without assistance.   Patient discharged home with outpatient physical therapy.  Outpatient Neurology follow-up.  2. GERD-continue Protonix.  3. History of PTSD-continue Topamax.  4. Depression-continue trazodone, Cymbalta.  5. History of chronic back pain-continue ketorolac.  6. Urinary retention-continue Flomax. Voiding well.  Stable for discharge   To home  CONSULTS OBTAINED:  Treatment Team:  Alexis Goodell, MD  DRUG ALLERGIES:   Allergies  Allergen Reactions  . Peanut Butter Flavor Swelling    Throat swells  . Codeine Nausea And Vomiting    nausea  . Paroxetine     Patient states the cause of a panic attack but denies an actual physical injury.  Marland Kitchen  Paxil [Paroxetine Hcl] Other (See Comments)    Panic attacks  . Venlafaxine     Patient states it causes a panic attack but denies it ever caused a physical manifestation  . Zoloft [Sertraline Hcl] Other (See Comments)    Panic attacks    DISCHARGE MEDICATIONS:   Current Discharge Medication List    START taking these medications   Details  tamsulosin (FLOMAX) 0.4 MG CAPS capsule Take  1 capsule (0.4 mg total) by mouth daily. Qty: 30 capsule, Refills: 0      CONTINUE these medications which have CHANGED   Details  diazepam (VALIUM) 5 MG tablet Take 1 tablet (5 mg total) by mouth at bedtime. Inserts tablet vaginally. Qty: 10 tablet, Refills: 0    traMADol (ULTRAM) 50 MG tablet Take 2 tablets (100 mg total) by mouth every 6 (six) hours as needed for moderate pain. Qty: 20 tablet, Refills: 0      CONTINUE these medications which have NOT CHANGED   Details  cetirizine (ZYRTEC) 10 MG tablet Take 10 mg by mouth at bedtime.     diphenhydrAMINE (BENADRYL) 25 mg capsule Take 25 mg by mouth every 6 (six) hours as needed for allergies.    docusate sodium (COLACE) 100 MG capsule Take 100 mg by mouth 2 (two) times daily.     DULoxetine (CYMBALTA) 20 MG capsule Take 20 mg by mouth daily.    EPINEPHrine (EPIPEN 2-PAK) 0.3 mg/0.3 mL IJ SOAJ injection Inject 0.3 mg into the muscle once as needed. For allergic reaction    fluticasone (FLONASE) 50 MCG/ACT nasal spray Place 1 spray into both nostrils daily.    ketorolac (TORADOL) 10 MG tablet Take 1 tablet (10 mg total) by mouth every 6 (six) hours as needed. Qty: 20 tablet, Refills: 0    norethindrone (AYGESTIN) 5 MG tablet Take 5 mg by mouth daily.    omeprazole (PRILOSEC) 20 MG capsule Take 20 mg by mouth 2 (two) times daily before a meal.     ondansetron (ZOFRAN-ODT) 8 MG disintegrating tablet Take 8 mg by mouth daily as needed for nausea.    topiramate (TOPAMAX) 50 MG tablet Take 25 mg by mouth at bedtime.     traZODone (DESYREL) 50 MG tablet Take 1 tablet (50 mg total) by mouth at bedtime. Qty: 30 tablet, Refills: 3   Associated Diagnoses: Posttraumatic stress disorder; Major depressive disorder, recurrent episode, moderate (HCC)      STOP taking these medications     hydrOXYzine (VISTARIL) 50 MG capsule      prochlorperazine (COMPAZINE) 10 MG tablet         Today   VITAL SIGNS:  Blood pressure 106/67,  pulse 99, temperature 97.9 F (36.6 C), temperature source Oral, resp. rate 19, height 5\' 3"  (1.6 m), weight 93.9 kg (207 lb), last menstrual period 01/05/2016, SpO2 100 %.  I/O:   Intake/Output Summary (Last 24 hours) at 12/31/16 1313 Last data filed at 12/31/16 0950  Gross per 24 hour  Intake              720 ml  Output              300 ml  Net              420 ml    PHYSICAL EXAMINATION:  Physical Exam  GENERAL:  31 y.o.-year-old patient lying in the bed with no acute distress.  LUNGS: Normal breath sounds bilaterally, no wheezing, rales,rhonchi or crepitation. No use of  accessory muscles of respiration.  CARDIOVASCULAR: S1, S2 normal. No murmurs, rubs, or gallops.  ABDOMEN: Soft, non-tender, non-distended. Bowel sounds present. No organomegaly or mass.  NEUROLOGIC: Motor upper extremities 5/5. B/L LE 4-/5 PSYCHIATRIC: The patient is alert and oriented SKIN: No obvious rash, lesion, or ulcer.   DATA REVIEW:   CBC  Recent Labs Lab 12/29/16 0413  WBC 8.7  HGB 12.4  HCT 37.4  PLT 298    Chemistries   Recent Labs Lab 12/29/16 0413  NA 141  K 3.8  CL 111  CO2 23  GLUCOSE 90  BUN 7  CREATININE 0.93  CALCIUM 9.1    Cardiac Enzymes  Recent Labs Lab 12/26/16 2035  TROPONINI <0.03    Microbiology Results  Results for orders placed or performed during the hospital encounter of 12/28/16  Surgical PCR screen     Status: None   Collection Time: 12/29/16  1:30 AM  Result Value Ref Range Status   MRSA, PCR NEGATIVE NEGATIVE Final   Staphylococcus aureus NEGATIVE NEGATIVE Final    Comment: (NOTE) The Xpert SA Assay (FDA approved for NASAL specimens in patients 4 years of age and older), is one component of a comprehensive surveillance program. It is not intended to diagnose infection nor to guide or monitor treatment.     RADIOLOGY:  Mr Jeri Cos Wo Contrast  Result Date: 12/29/2016 CLINICAL DATA:  Lower extremity weakness, recent viral illness. EXAM:  MRI HEAD WITHOUT AND WITH CONTRAST TECHNIQUE: Multiplanar, multiecho pulse sequences of the brain and surrounding structures were obtained without and with intravenous contrast. CONTRAST:  58mL MULTIHANCE GADOBENATE DIMEGLUMINE 529 MG/ML IV SOLN COMPARISON:  CT HEAD December 28, 2016 and MRI of the head February 10, 2013 FINDINGS: INTRACRANIAL CONTENTS: No reduced diffusion to suggest acute ischemia or hyperacute demyelination. No susceptibility artifact to suggest hemorrhage. The ventricles and sulci are normal for patient's age. No suspicious parenchymal signal, masses, mass effect. No abnormal intraparenchymal or extra-axial enhancement. No abnormal extra-axial fluid collections. No extra-axial masses. VASCULAR: Normal major intracranial vascular flow voids present at skull base. SKULL AND UPPER CERVICAL SPINE: No abnormal sellar expansion. No suspicious calvarial bone marrow signal. Craniocervical junction maintained. SINUSES/ORBITS: The mastoid air-cells and included paranasal sinuses are well-aerated.The included ocular globes and orbital contents are non-suspicious. OTHER: Patient is edentulous. IMPRESSION: Normal MRI of the head with and without contrast. Electronically Signed   By: Elon Alas M.D.   On: 12/29/2016 14:13   Mr Lumbar Spine Wo Contrast  Result Date: 12/29/2016 CLINICAL DATA:  Progressive lower extremity weakness after recent viral illness. EXAM: MRI LUMBAR SPINE WITHOUT CONTRAST TECHNIQUE: Multiplanar, multisequence MR imaging of the lumbar spine was performed. No intravenous contrast was administered. COMPARISON:  CT abdomen and pelvis October 26, 2015 FINDINGS: SEGMENTATION: For the purposes of this report, the last well-formed intervertebral disc will be reported as L5-S1. ALIGNMENT: Maintained lumbar lordosis. No malalignment. Broad dextroscoliosis apparent on the axial sequences. VERTEBRAE:Limited assessment due to thoracolumbar Harrington rods better demonstrated on prior CT.  Vertebral bodies are intact. Intervertebral discs demonstrate normal morphology and signal characteristics. No abnormal bone marrow signal. CONUS MEDULLARIS: Conus medullaris and cauda equina predominately obscured due to hardware artifact. Cauda equina from L3-4 caudally is normal. PARASPINAL AND SOFT TISSUES: Characterized prevertebral and paraspinal soft tissues are nonacute. Mild paraspinal muscle atrophy and postoperative susceptibility artifact. DISC LEVELS: T12-L1 thru L2-3: Predominately obscured. No definite canal stenosis. L3-4: No disc bulge, canal stenosis nor neural foraminal narrowing. Mild facet arthropathy. L4-5:  Small RIGHT subarticular disc protrusion. Mild facet arthropathy without canal stenosis or neural foraminal narrowing. L5-S1: No disc bulge, canal stenosis nor neural foraminal narrowing. Mild facet arthropathy. IMPRESSION: 1. Limited assessment due to the hardware artifact, thoracolumbar Harrington rods to the level of L2-3. 2. No canal stenosis or neural foraminal narrowing the L3-4 through L5-S1. Nondiagnostic evaluation of upper levels. Electronically Signed   By: Elon Alas M.D.   On: 12/29/2016 14:18   Dg Myelogram 2+ Regions  Result Date: 12/31/2016 CLINICAL DATA:  Fall 2 days ago. Lower extremity weakness. Unable to stand. FLUOROSCOPY TIME:  dictate in minuets and seconds PROCEDURE: LUMBAR PUNCTURE FOR CERVICAL LUMBAR AND THORACIC MYELOGRAM CERVICAL AND LUMBAR AND THORACIC MYELOGRAM CT CERVICAL MYELOGRAM CT LUMBAR MYELOGRAM CT THORACIC MYELOGRAM After thorough discussion of risks and benefits of the procedure including bleeding, infection, injury to nerves, blood vessels, adjacent structures as well as headache and CSF leak, written and oral informed consent was obtained. Consent was obtained by Dr. Marcello Moores Register. Patient was positioned prone on the fluoroscopy table. Local anesthesia was provided with 1% lidocaine without epinephrine after prepped and draped in the  usual sterile fashion. Puncture was performed at the L4-L5 using a 3 1/2 inch 22-gauge spinal needle via _ left _ paramedian approach. Using a single pass through the dura, the needle was placed within the thecal sac, with return of clear CSF. 10 mL Isovue-M 300 was injected into the thecal sac, with normal opacification of the nerve roots and cauda equina consistent with free flow within the subarachnoid space. The patient was then moved to the trendelenburg position and contrast flowed into the Thoracic and Cervical spine regions. I personally performed the lumbar puncture and administered the intrathecal contrast. I also personally supervised acquisition of the myelogram images. Finding procedure hemostasis achieved. Patient sent to CT for further imaging. No complications. TECHNIQUE: Contiguous axial images were obtained through the Cervical, Thoracic, and Lumbar spine after the intrathecal infusion of infusion. Coronal and sagittal reconstructions were obtained of the axial image sets. FINDINGS: CERVICAL/ THORACIC, AND LUMBAR MYELOGRAM FINDINGS: The lumbar/thoracic/ cervical thecal sac appear to be widely patent. Reference is made to postmyelogram CT findings for more detailed results. CT CERVICAL MYELOGRAM FINDINGS: Reference is made to CT report . CT LUMBAR MYELOGRAM FINDINGS: Reference is made to CT report . CT THORACIC MYELOGRAM FINDINGS: Reference is made to CT report. IMPRESSION: Successful lumbar/ thoracic/cervical myelogram. Wide patency of the thecal sac noted. Reference is made to postmyelogram CT report for more detailed findings. Electronically Signed   By: McKean   On: 12/31/2016 10:14    Follow up with PCP in 1 week.  Management plans discussed with the patient, family and they are in agreement.  CODE STATUS:     Code Status Orders        Start     Ordered   12/29/16 0051  Full code  Continuous     12/29/16 0050    Code Status History    Date Active Date Inactive Code  Status Order ID Comments User Context   This patient has a current code status but no historical code status.      TOTAL TIME TAKING CARE OF THIS PATIENT ON DAY OF DISCHARGE: more than 30 minutes.   Hillary Bow R M.D on 12/31/2016 at 1:13 PM  Between 7am to 6pm - Pager - [phone]  After 6pm go to [email or web address] - password [email or web address] [facility]  Office  [phone]  CC: Primary care physician; Lavera Guise, MD  Note: This dictation was prepared with Dragon dictation along with smaller phrase technology. Any transcriptional errors that result from this process are unintentional.

## 2016-12-31 NOTE — Discharge Instructions (Addendum)
Activity as tolerated with walker  Regular diet

## 2016-12-31 NOTE — Progress Notes (Signed)
Patient's PASARR has been received, [id number] E expires on 01/30/2017. Plan is for patient to D/C to [facility] when medically stable.   [employer], LCSW [phone]

## 2016-12-31 NOTE — Progress Notes (Signed)
Subjective: Patient s/p myelogram.  No new complaints.  Reports urinary symptoms improved.  Objective: Current vital signs: BP 103/61 (BP Location: Left Arm)   Pulse 97   Temp 97.8 F (36.6 C) (Oral)   Resp 15   Ht 5\' 3"  (1.6 m)   Wt 93.9 kg (207 lb)   LMP 01/05/2016   SpO2 100%   BMI 36.67 kg/m  Vital signs in last 24 hours: Temp:  [97.8 F (36.6 C)-98.8 F (37.1 C)] 97.8 F (36.6 C) (09/27 2026) Pulse Rate:  [97-99] 97 (09/27 2026) Resp:  [15] 15 (09/27 2026) BP: (102-103)/(60-61) 103/61 (09/27 2026) SpO2:  [100 %] 100 % (09/27 2026)  Intake/Output from previous day: 09/26 0701 - 09/27 0700 In: 480 [P.O.:480] Out: 350 [Urine:350] Intake/Output this shift: No intake/output data recorded. Nutritional status: Diet regular Room service appropriate? Yes; Fluid consistency: Thin  Neurologic Exam: Mental Status: Alert, oriented, thought content appropriate. Speech fluent without evidence of aphasia. Able to follow 3 step commands without difficulty. Cranial Nerves: II: Discs flat bilaterally; Visual fields grossly normal, pupils equal, round, reactive to light and accommodation III,IV, VI: ptosis not present, extra-ocular motions intact bilaterally V,VII: smile symmetric, facial light touch sensation normal bilaterally VIII: hearing normal bilaterally IX,X: gag reflex present XI: bilateral shoulder shrug XII: midline tongue extension Motor: 5/5 strength throughout.   Sensory: Pinprick and light touch decreased in the left upper and lower extremity Deep Tendon Reflexes: 2+ and symmetric throughout  Lab Results: Basic Metabolic Panel:  Recent Labs Lab 12/26/16 2035 12/28/16 2129 12/29/16 0413  NA 139 141 141  K 3.4* 3.3* 3.8  CL 111 110 111  CO2 20* 22 23  GLUCOSE 117* 88 90  BUN 10 8 7   CREATININE 0.83 0.88 0.93  CALCIUM 8.9 9.0 9.1    Liver Function Tests: No results for input(s): AST, ALT, ALKPHOS, BILITOT, PROT, ALBUMIN in the last 168 hours. No  results for input(s): LIPASE, AMYLASE in the last 168 hours. No results for input(s): AMMONIA in the last 168 hours.  CBC:  Recent Labs Lab 12/26/16 2035 12/28/16 2129 12/29/16 0413  WBC 7.2 9.2 8.7  HGB 12.4 12.7 12.4  HCT 36.9 38.7 37.4  MCV 71.6* 72.0* 73.5*  PLT 290 337 298    Cardiac Enzymes:  Recent Labs Lab 12/26/16 2035 12/28/16 2129  CKTOTAL  --  133  TROPONINI <0.03  --     Lipid Panel: No results for input(s): CHOL, TRIG, HDL, CHOLHDL, VLDL, LDLCALC in the last 168 hours.  CBG: No results for input(s): GLUCAP in the last 168 hours.  Microbiology: Results for orders placed or performed during the hospital encounter of 12/28/16  Surgical PCR screen     Status: None   Collection Time: 12/29/16  1:30 AM  Result Value Ref Range Status   MRSA, PCR NEGATIVE NEGATIVE Final   Staphylococcus aureus NEGATIVE NEGATIVE Final    Comment: (NOTE) The Xpert SA Assay (FDA approved for NASAL specimens in patients 65 years of age and older), is one component of a comprehensive surveillance program. It is not intended to diagnose infection nor to guide or monitor treatment.     Coagulation Studies:  Recent Labs  12/28/16 2153  LABPROT 13.6  INR 1.05    Imaging: Dg Myelogram 2+ Regions  Result Date: 12/31/2016 CLINICAL DATA:  Fall 2 days ago. Lower extremity weakness. Unable to stand. FLUOROSCOPY TIME:  dictate in minuets and seconds PROCEDURE: LUMBAR PUNCTURE FOR CERVICAL LUMBAR AND THORACIC MYELOGRAM CERVICAL  AND LUMBAR AND THORACIC MYELOGRAM CT CERVICAL MYELOGRAM CT LUMBAR MYELOGRAM CT THORACIC MYELOGRAM After thorough discussion of risks and benefits of the procedure including bleeding, infection, injury to nerves, blood vessels, adjacent structures as well as headache and CSF leak, written and oral informed consent was obtained. Consent was obtained by Dr. Marcello Moores Register. Patient was positioned prone on the fluoroscopy table. Local anesthesia was provided with  1% lidocaine without epinephrine after prepped and draped in the usual sterile fashion. Puncture was performed at the L4-L5 using a 3 1/2 inch 22-gauge spinal needle via _ left _ paramedian approach. Using a single pass through the dura, the needle was placed within the thecal sac, with return of clear CSF. 10 mL Isovue-M 300 was injected into the thecal sac, with normal opacification of the nerve roots and cauda equina consistent with free flow within the subarachnoid space. The patient was then moved to the trendelenburg position and contrast flowed into the Thoracic and Cervical spine regions. I personally performed the lumbar puncture and administered the intrathecal contrast. I also personally supervised acquisition of the myelogram images. Finding procedure hemostasis achieved. Patient sent to CT for further imaging. No complications. TECHNIQUE: Contiguous axial images were obtained through the Cervical, Thoracic, and Lumbar spine after the intrathecal infusion of infusion. Coronal and sagittal reconstructions were obtained of the axial image sets. FINDINGS: CERVICAL/ THORACIC, AND LUMBAR MYELOGRAM FINDINGS: The lumbar/thoracic/ cervical thecal sac appear to be widely patent. Reference is made to postmyelogram CT findings for more detailed results. CT CERVICAL MYELOGRAM FINDINGS: Reference is made to CT report . CT LUMBAR MYELOGRAM FINDINGS: Reference is made to CT report . CT THORACIC MYELOGRAM FINDINGS: Reference is made to CT report. IMPRESSION: Successful lumbar/ thoracic/cervical myelogram. Wide patency of the thecal sac noted. Reference is made to postmyelogram CT report for more detailed findings. Electronically Signed   By: [place]   On: 12/31/2016 10:14    Medications:  I have reviewed the patient's current medications. Scheduled: . DULoxetine  30 mg Oral Daily  . lidocaine (PF)  5 mL Other Once  . mouth rinse  15 mL Mouth Rinse BID  . norethindrone  5 mg Oral Daily  . pantoprazole   40 mg Oral BID  . tamsulosin  0.4 mg Oral Daily  . topiramate  25 mg Oral Daily  . traZODone  50 mg Oral QHS    Assessment/Plan: No new complaints.  Symptoms not progressive.  Urinary symptoms improved.  Patient s/p myelogram.  Thecal sac patent.  Awaiting CT results.  Etiology of symptoms remain unclear but no evidence of cord compression.  Myasthenia panel negative.  Recommendations: 1. Patient to follow up with neurology on an outpatient basis.   LOS: 3 days   Alexis Goodell, MD Neurology [phone] 12/31/2016  8:52 PM

## 2017-01-01 ENCOUNTER — Inpatient Hospital Stay: Payer: Medicare Other

## 2017-01-01 MED ORDER — TAMSULOSIN HCL 0.4 MG PO CAPS: 0.4000 mg | ORAL_CAPSULE | Freq: Every day | ORAL | 0 refills | Status: DC

## 2017-01-01 NOTE — Care Management (Signed)
Per PT patient will not need HHPT as she walked 400+feet however she will need a walker which has been requested from [facility]. No other RNCM needs.

## 2017-01-01 NOTE — Progress Notes (Signed)
PT is now recommending home health. Clinical Social Worker (CSW) met with patient and made her aware of above. Patient is agreeable to D/C home with home health today. Patient gave CSW permission to call her community case manager Benjamine Mola. CSW contacted Benjamine Mola and made her aware of above. Per Signa Kell staff member Lowell Guitar will pick patient up today between 1 and 2. RN and RN case manager aware of above. Please reconsult if future social work needs arise. CSW signing off.   [employer], LCSW [phone]

## 2017-01-01 NOTE — Progress Notes (Signed)
Subjective: Patient reports feeling tired today but otherwise unchanged.    Objective: Current vital signs: BP 113/65   Pulse 96   Temp 98.3 F (36.8 C) (Oral)   Resp 18   Ht 5\' 3"  (1.6 m)   Wt 93.9 kg (207 lb)   LMP 01/05/2016   SpO2 100%   BMI 36.67 kg/m  Vital signs in last 24 hours: Temp:  [97.8 F (36.6 C)-98.8 F (37.1 C)] 98.3 F (36.8 C) (09/28 1344) Pulse Rate:  [90-99] 96 (09/28 1344) Resp:  [15-18] 18 (09/28 1344) BP: (99-113)/(60-65) 113/65 (09/28 1344) SpO2:  [99 %-100 %] 100 % (09/28 1344)  Intake/Output from previous day: 09/27 0701 - 09/28 0700 In: 1010 [P.O.:960; IV Piggyback:50] Out: 900 [Urine:900] Intake/Output this shift: Total I/O In: 240 [P.O.:240] Out: -  Nutritional status: Diet regular Room service appropriate? Yes; Fluid consistency: Thin  Neurologic Exam: Mental Status: Alert, oriented, thought content appropriate. Speech fluent without evidence of aphasia. Able to follow 3 step commands without difficulty. Cranial Nerves: II: Discs flat bilaterally; Visual fields grossly normal, pupils equal, round, reactive to light and accommodation III,IV, VI: ptosis not present, extra-ocular motions intact bilaterally V,VII: smile symmetric, facial light touch sensation normal bilaterally VIII: hearing normal bilaterally IX,X: gag reflex present XI: bilateral shoulder shrug XII: midline tongue extension Motor: 5/5 strength throughout.  Sensory: Pinprick and light touch decreased in the left upper and lower extremity Deep Tendon Reflexes: 2+ and symmetric throughout   Lab Results: Basic Metabolic Panel:  Recent Labs Lab 12/26/16 2035 12/28/16 2129 12/29/16 0413  NA 139 141 141  K 3.4* 3.3* 3.8  CL 111 110 111  CO2 20* 22 23  GLUCOSE 117* 88 90  BUN 10 8 7   CREATININE 0.83 0.88 0.93  CALCIUM 8.9 9.0 9.1    Liver Function Tests: No results for input(s): AST, ALT, ALKPHOS, BILITOT, PROT, ALBUMIN in the last 168 hours. No results  for input(s): LIPASE, AMYLASE in the last 168 hours. No results for input(s): AMMONIA in the last 168 hours.  CBC:  Recent Labs Lab 12/26/16 2035 12/28/16 2129 12/29/16 0413  WBC 7.2 9.2 8.7  HGB 12.4 12.7 12.4  HCT 36.9 38.7 37.4  MCV 71.6* 72.0* 73.5*  PLT 290 337 298    Cardiac Enzymes:  Recent Labs Lab 12/26/16 2035 12/28/16 2129  CKTOTAL  --  133  TROPONINI <0.03  --     Lipid Panel: No results for input(s): CHOL, TRIG, HDL, CHOLHDL, VLDL, LDLCALC in the last 168 hours.  CBG: No results for input(s): GLUCAP in the last 168 hours.  Microbiology: Results for orders placed or performed during the hospital encounter of 12/28/16  Surgical PCR screen     Status: None   Collection Time: 12/29/16  1:30 AM  Result Value Ref Range Status   MRSA, PCR NEGATIVE NEGATIVE Final   Staphylococcus aureus NEGATIVE NEGATIVE Final    Comment: (NOTE) The Xpert SA Assay (FDA approved for NASAL specimens in patients 63 years of age and older), is one component of a comprehensive surveillance program. It is not intended to diagnose infection nor to guide or monitor treatment.     Coagulation Studies: No results for input(s): LABPROT, INR in the last 72 hours.  Imaging: Ct Cervical Spine W Contrast  Result Date: 01/01/2017 CLINICAL DATA:  Fall 2 days ago. Lower extremity weakness. Unable to stand. FLUOROSCOPY TIME:  dictate in minuets and seconds PROCEDURE: LUMBAR PUNCTURE FOR CERVICAL LUMBAR AND THORACIC MYELOGRAM CERVICAL AND  LUMBAR AND THORACIC MYELOGRAM CT CERVICAL MYELOGRAM CT LUMBAR MYELOGRAM CT THORACIC MYELOGRAM After thorough discussion of risks and benefits of the procedure including bleeding, infection, injury to nerves, blood vessels, adjacent structures as well as headache and CSF leak, written and oral informed consent was obtained. Consent was obtained by Dr. Marcello Moores Register. Patient was positioned prone on the fluoroscopy table. Local anesthesia was provided with 1%  lidocaine without epinephrine after prepped and draped in the usual sterile fashion. Puncture was performed at the L4-L5 using a 3 1/2 inch 22-gauge spinal needle via _ left _ paramedian approach. Using a single pass through the dura, the needle was placed within the thecal sac, with return of clear CSF. 10 mL Isovue-M 300 was injected into the thecal sac, with normal opacification of the nerve roots and cauda equina consistent with free flow within the subarachnoid space. The patient was then moved to the trendelenburg position and contrast flowed into the Thoracic and Cervical spine regions. I personally performed the lumbar puncture and administered the intrathecal contrast. I also personally supervised acquisition of the myelogram images. Finding procedure hemostasis achieved. Patient sent to CT for further imaging. No complications. TECHNIQUE: Contiguous axial images were obtained through the Cervical, Thoracic, and Lumbar spine after the intrathecal infusion of infusion. Coronal and sagittal reconstructions were obtained of the axial image sets. FINDINGS: CERVICAL/ THORACIC, AND LUMBAR MYELOGRAM FINDINGS: The lumbar/thoracic/ cervical thecal sac appear to be widely patent. Reference is made to postmyelogram CT findings for more detailed results. CT CERVICAL MYELOGRAM FINDINGS: Reference is made to CT report . CT LUMBAR MYELOGRAM FINDINGS: Reference is made to CT report . CT THORACIC MYELOGRAM FINDINGS: Reference is made to CT report. IMPRESSION: Successful lumbar/ thoracic/cervical myelogram. Wide patency of the thecal sac noted. Reference is made to postmyelogram CT report for more detailed findings. Electronically Signed   By: Marcello Moores  Register   On: 12/31/2016 10:14   Ct Thoracic Spine W Contrast  Result Date: 01/01/2017 CLINICAL DATA:  Fall 2 days ago. Lower extremity weakness. Unable to stand. FLUOROSCOPY TIME:  dictate in minuets and seconds PROCEDURE: LUMBAR PUNCTURE FOR CERVICAL LUMBAR AND THORACIC  MYELOGRAM CERVICAL AND LUMBAR AND THORACIC MYELOGRAM CT CERVICAL MYELOGRAM CT LUMBAR MYELOGRAM CT THORACIC MYELOGRAM After thorough discussion of risks and benefits of the procedure including bleeding, infection, injury to nerves, blood vessels, adjacent structures as well as headache and CSF leak, written and oral informed consent was obtained. Consent was obtained by Dr. Marcello Moores Register. Patient was positioned prone on the fluoroscopy table. Local anesthesia was provided with 1% lidocaine without epinephrine after prepped and draped in the usual sterile fashion. Puncture was performed at the L4-L5 using a 3 1/2 inch 22-gauge spinal needle via _ left _ paramedian approach. Using a single pass through the dura, the needle was placed within the thecal sac, with return of clear CSF. 10 mL Isovue-M 300 was injected into the thecal sac, with normal opacification of the nerve roots and cauda equina consistent with free flow within the subarachnoid space. The patient was then moved to the trendelenburg position and contrast flowed into the Thoracic and Cervical spine regions. I personally performed the lumbar puncture and administered the intrathecal contrast. I also personally supervised acquisition of the myelogram images. Finding procedure hemostasis achieved. Patient sent to CT for further imaging. No complications. TECHNIQUE: Contiguous axial images were obtained through the Cervical, Thoracic, and Lumbar spine after the intrathecal infusion of infusion. Coronal and sagittal reconstructions were obtained of the axial image  sets. FINDINGS: CERVICAL/ THORACIC, AND LUMBAR MYELOGRAM FINDINGS: The lumbar/thoracic/ cervical thecal sac appear to be widely patent. Reference is made to postmyelogram CT findings for more detailed results. CT CERVICAL MYELOGRAM FINDINGS: Reference is made to CT report . CT LUMBAR MYELOGRAM FINDINGS: Reference is made to CT report . CT THORACIC MYELOGRAM FINDINGS: Reference is made to CT report.  IMPRESSION: Successful lumbar/ thoracic/cervical myelogram. Wide patency of the thecal sac noted. Reference is made to postmyelogram CT report for more detailed findings. Electronically Signed   By: [place]   On: 12/31/2016 10:14   Ct Lumbar Spine W Contrast  Result Date: 01/01/2017 CLINICAL DATA:  Fall 2 days ago. Lower extremity weakness. Unable to stand. FLUOROSCOPY TIME:  dictate in minuets and seconds PROCEDURE: LUMBAR PUNCTURE FOR CERVICAL LUMBAR AND THORACIC MYELOGRAM CERVICAL AND LUMBAR AND THORACIC MYELOGRAM CT CERVICAL MYELOGRAM CT LUMBAR MYELOGRAM CT THORACIC MYELOGRAM After thorough discussion of risks and benefits of the procedure including bleeding, infection, injury to nerves, blood vessels, adjacent structures as well as headache and CSF leak, written and oral informed consent was obtained. Consent was obtained by Dr. Marcello Moores Register. Patient was positioned prone on the fluoroscopy table. Local anesthesia was provided with 1% lidocaine without epinephrine after prepped and draped in the usual sterile fashion. Puncture was performed at the L4-L5 using a 3 1/2 inch 22-gauge spinal needle via _ left _ paramedian approach. Using a single pass through the dura, the needle was placed within the thecal sac, with return of clear CSF. 10 mL Isovue-M 300 was injected into the thecal sac, with normal opacification of the nerve roots and cauda equina consistent with free flow within the subarachnoid space. The patient was then moved to the trendelenburg position and contrast flowed into the Thoracic and Cervical spine regions. I personally performed the lumbar puncture and administered the intrathecal contrast. I also personally supervised acquisition of the myelogram images. Finding procedure hemostasis achieved. Patient sent to CT for further imaging. No complications. TECHNIQUE: Contiguous axial images were obtained through the Cervical, Thoracic, and Lumbar spine after the intrathecal infusion  of infusion. Coronal and sagittal reconstructions were obtained of the axial image sets. FINDINGS: CERVICAL/ THORACIC, AND LUMBAR MYELOGRAM FINDINGS: The lumbar/thoracic/ cervical thecal sac appear to be widely patent. Reference is made to postmyelogram CT findings for more detailed results. CT CERVICAL MYELOGRAM FINDINGS: Reference is made to CT report . CT LUMBAR MYELOGRAM FINDINGS: Reference is made to CT report . CT THORACIC MYELOGRAM FINDINGS: Reference is made to CT report. IMPRESSION: Successful lumbar/ thoracic/cervical myelogram. Wide patency of the thecal sac noted. Reference is made to postmyelogram CT report for more detailed findings. Electronically Signed   By: Marcello Moores  Register   On: 12/31/2016 10:14   Dg Myelogram 2+ Regions  Result Date: 12/31/2016 CLINICAL DATA:  Fall 2 days ago. Lower extremity weakness. Unable to stand. FLUOROSCOPY TIME:  dictate in minuets and seconds PROCEDURE: LUMBAR PUNCTURE FOR CERVICAL LUMBAR AND THORACIC MYELOGRAM CERVICAL AND LUMBAR AND THORACIC MYELOGRAM CT CERVICAL MYELOGRAM CT LUMBAR MYELOGRAM CT THORACIC MYELOGRAM After thorough discussion of risks and benefits of the procedure including bleeding, infection, injury to nerves, blood vessels, adjacent structures as well as headache and CSF leak, written and oral informed consent was obtained. Consent was obtained by Dr. Marcello Moores Register. Patient was positioned prone on the fluoroscopy table. Local anesthesia was provided with 1% lidocaine without epinephrine after prepped and draped in the usual sterile fashion. Puncture was performed at the L4-L5 using a 3 1/2  inch 22-gauge spinal needle via _ left _ paramedian approach. Using a single pass through the dura, the needle was placed within the thecal sac, with return of clear CSF. 10 mL Isovue-M 300 was injected into the thecal sac, with normal opacification of the nerve roots and cauda equina consistent with free flow within the subarachnoid space. The patient was then  moved to the trendelenburg position and contrast flowed into the Thoracic and Cervical spine regions. I personally performed the lumbar puncture and administered the intrathecal contrast. I also personally supervised acquisition of the myelogram images. Finding procedure hemostasis achieved. Patient sent to CT for further imaging. No complications. TECHNIQUE: Contiguous axial images were obtained through the Cervical, Thoracic, and Lumbar spine after the intrathecal infusion of infusion. Coronal and sagittal reconstructions were obtained of the axial image sets. FINDINGS: CERVICAL/ THORACIC, AND LUMBAR MYELOGRAM FINDINGS: The lumbar/thoracic/ cervical thecal sac appear to be widely patent. Reference is made to postmyelogram CT findings for more detailed results. CT CERVICAL MYELOGRAM FINDINGS: Reference is made to CT report . CT LUMBAR MYELOGRAM FINDINGS: Reference is made to CT report . CT THORACIC MYELOGRAM FINDINGS: Reference is made to CT report. IMPRESSION: Successful lumbar/ thoracic/cervical myelogram. Wide patency of the thecal sac noted. Reference is made to postmyelogram CT report for more detailed findings. Electronically Signed   By: [place]   On: 12/31/2016 10:14    Medications:  I have reviewed the patient's current medications. Scheduled: . DULoxetine  30 mg Oral Daily  . lidocaine (PF)  5 mL Other Once  . mouth rinse  15 mL Mouth Rinse BID  . norethindrone  5 mg Oral Daily  . pantoprazole  40 mg Oral BID  . tamsulosin  0.4 mg Oral Daily  . topiramate  25 mg Oral Daily  . traZODone  50 mg Oral QHS    Assessment/Plan: Patient with no progression of symptoms, in fact gait is improved today.  Myelogram showed no significant abnormalities.    Recommendations: 1. Patient to follow up with neurology on an outpatient basis.   LOS: 4 days   Alexis Goodell, MD Neurology [phone] 01/01/2017  1:49 PM

## 2017-01-01 NOTE — Progress Notes (Signed)
Physical Therapy Treatment Patient Details Name: Teresa Crawford MRN: [id number] DOB: March 27, 1986 Today's Date: 01/01/2017    History of Present Illness Pt is a 31 year old female with past medical history of fistula, chronic stress disorder, peptic ulcer disease, depression, chronic fatigue syndrome, chronic back pain who presented to the hospital due to lower extremity weakness and difficulty walking.  Assessment includes: Lower extremity weakness/difficulty walking-etiology unclear presently, suspected to be atypical GBS but unlikely as seen by Neurology and their suspicion is low; GERD, and PTSD.    PT Comments    Pt showed greatly improved function though she still had considerable functional weakness in LEs and generally had balance limitations.  She was able to maintain consistent speed with ambulation but foot placement, knee hyperextension and quality of motion in LEs was lacking t/o most of the effort.  Pt was able to do some ambulation with cane only, but was not safe with this.  Pt scored a 48/56 on the Berg Balance Scale showing need for AD.  Balance and gait are not at baseline and pt would benefit from HHPT or outpatient PT per ability to safely get out of the home.   Follow Up Recommendations  Home health PT;Outpatient PT;Supervision - Intermittent (per progress)     Equipment Recommendations  Rolling walker with 5" wheels    Recommendations for Other Services       Precautions / Restrictions Precautions Precautions: Fall Restrictions Weight Bearing Restrictions: No    Mobility  Bed Mobility Overal bed mobility: Independent                Transfers Overall transfer level: Modified independent Equipment used: Rolling walker (2 wheeled) Transfers: Sit to/from Stand Sit to Stand: Min guard         General transfer comment: Pt able to rise with light cuing but no direct assist  Ambulation/Gait Ambulation/Gait assistance: Min guard Ambulation Distance  (Feet): 450 Feet Assistive device: Straight cane;Rolling walker (2 wheeled)       General Gait Details: Pt with some coordination/quality of motion issues with b/l LEs, but was able to do prolonged bout of ambulation w/o LOBs.  She was able to walk ~100 ft with [facility] though she was much more unsteady and did need occasional light assist.  Pt's HR did increased to the 140s with the effort. Pt needed very frequent reminders with walker use, posture, appropriate speed and cadence and generally safety awareness.   Stairs            Wheelchair Mobility    Modified Rankin (Stroke Patients Only)       Balance Overall balance assessment: Needs assistance                               Standardized Balance Assessment Standardized Balance Assessment : Berg Balance Test Berg Balance Test Sit to Stand: Able to stand without using hands and stabilize independently Standing Unsupported: Able to stand safely 2 minutes Sitting with Back Unsupported but Feet Supported on Floor or Stool: Able to sit safely and securely 2 minutes Stand to Sit: Sits safely with minimal use of hands Transfers: Able to transfer safely, minor use of hands Standing Unsupported with Eyes Closed: Able to stand 10 seconds safely Standing Ubsupported with Feet Together: Able to place feet together independently and stand 1 minute safely From Standing, Reach Forward with Outstretched Arm: Can reach forward >12 cm safely (5") From Standing Position, Pick  up Object from Floor: Able to pick up shoe, needs supervision From Standing Position, Turn to Look Behind Over each Shoulder: Looks behind from both sides and weight shifts well Turn 360 Degrees: Able to turn 360 degrees safely in 4 seconds or less Standing Unsupported, Alternately Place Feet on Step/Stool: Able to stand independently and complete 8 steps >20 seconds Standing Unsupported, One Foot in Front: Needs help to step but can hold 15 seconds Standing on  One Leg: Able to lift leg independently and hold equal to or more than 3 seconds Total Score: 48        Cognition Arousal/Alertness: Awake/alert Behavior During Therapy: WFL for tasks assessed/performed Overall Cognitive Status: Within Functional Limits for tasks assessed                                        Exercises      General Comments        Pertinent Vitals/Pain Pain Assessment: No/denies pain    Home Living                      Prior Function            PT Goals (current goals can now be found in the care plan section) Progress towards PT goals: Progressing toward goals    Frequency    Min 2X/week      PT Plan Discharge plan needs to be updated    Co-evaluation              AM-PAC PT "6 Clicks" Daily Activity  Outcome Measure  Difficulty turning over in bed (including adjusting bedclothes, sheets and blankets)?: None Difficulty moving from lying on back to sitting on the side of the bed? : None Difficulty sitting down on and standing up from a chair with arms (e.g., wheelchair, bedside commode, etc,.)?: None Help needed moving to and from a bed to chair (including a wheelchair)?: None Help needed walking in hospital room?: A Little Help needed climbing 3-5 steps with a railing? : A Little 6 Click Score: 22    End of Session Equipment Utilized During Treatment: Gait belt Activity Tolerance: Patient limited by fatigue Patient left: in chair;with chair alarm set;with call bell/phone within reach Nurse Communication: Mobility status PT Visit Diagnosis: Difficulty in walking, not elsewhere classified (R26.2);Muscle weakness (generalized) (M62.81)     Time: 4008-6761 PT Time Calculation (min) (ACUTE ONLY): 32 min  Charges:  $Gait Training: 8-22 mins $Neuromuscular Re-education: 8-22 mins                    G Codes:       Kreg Shropshire, DPT 01/01/2017, 11:46 AM

## 2017-01-14 ENCOUNTER — Ambulatory Visit: Payer: Medicare Other | Admitting: Psychiatry

## 2017-01-21 ENCOUNTER — Ambulatory Visit: Payer: Self-pay | Admitting: Dietician

## 2017-01-29 DIAGNOSIS — Z8041 Family history of malignant neoplasm of ovary: Secondary | ICD-10-CM | POA: Diagnosis not present

## 2017-01-29 DIAGNOSIS — Z803 Family history of malignant neoplasm of breast: Secondary | ICD-10-CM | POA: Diagnosis not present

## 2017-02-02 DIAGNOSIS — R569 Unspecified convulsions: Secondary | ICD-10-CM | POA: Diagnosis not present

## 2017-02-08 ENCOUNTER — Encounter: Payer: Medicare Other | Attending: Internal Medicine | Admitting: Dietician

## 2017-02-08 ENCOUNTER — Encounter: Payer: Self-pay | Admitting: Dietician

## 2017-02-08 VITALS — Wt 212.1 lb

## 2017-02-08 DIAGNOSIS — E669 Obesity, unspecified: Secondary | ICD-10-CM | POA: Diagnosis not present

## 2017-02-08 DIAGNOSIS — Z713 Dietary counseling and surveillance: Secondary | ICD-10-CM | POA: Insufficient documentation

## 2017-02-08 DIAGNOSIS — Z6831 Body mass index (BMI) 31.0-31.9, adult: Secondary | ICD-10-CM

## 2017-02-08 DIAGNOSIS — F329 Major depressive disorder, single episode, unspecified: Secondary | ICD-10-CM | POA: Diagnosis not present

## 2017-02-08 DIAGNOSIS — E6609 Other obesity due to excess calories: Secondary | ICD-10-CM

## 2017-02-08 NOTE — Progress Notes (Signed)
Medical Nutrition Therapy Follow-up visit:  Time with patient: 1330-1430 Visit #: 3 ASSESSMENT:  Diagnosis:obesity, major depressive disorder  Current weight:212.1 lbs   Height: 65.5in Medications: See list Medical History: chronic fatigue, intellectual disability, fatty liver; also has a peanut allergy Progress and evaluation: Patient accompanied by her coach (T.T.) came for nutrition follow-up. She reports that she continues to skip meals frequently, does not want to cook most of the time although staff is willing to help her. She eats "out" frequently or eats lunchables or "frozen" dinners. She drinks 2-3 cans of Gingerale daily. She continues to buy "Little Debbie's" and oreos; will sometimes eat a "sleeve" of oreos at a time. She has not followed through to go the to [facility] for exercise even though the staff is willing to take her. She states she wants to lose weight and is willing to make some changes. Her weight today is 5 lbs more than her weight 2 1/2 months ago.  NUTRITION CARE EDUCATION: Basic nutrition/Exercise:  Discussed importance of establishing a consistent meal/snack schedule to avoid skipping meals and then snacking on sweets or chips. Discussed how staff could help her prepare simple meals that balance meat with starch and vegetables or fruit. Discussed menu options. Gave and reviewed snack options but discussed importance of limiting sweets at her home. Stressed again the importance of allowing staff to take her to the [facility] for exercise as well as walking with them when the weather allows. T.T. Said she would be glad to walk with her. There are 3 coaches that alternate 3 hours/day with [name].  NUTRITIONAL DIAGNOSIS:  No change from initial visit. INTERVENTION:  Establish a meal pattern of 3 meals and 2 snacks. 7:00am 12:00noon 3:00pm, 6:00pm, 8:00 snack Gym- 2 days per week for 30 minutes. Walk for exercise with a staff person at least 3 days per week. Talk with staff  about dinner plans so they can help prepare. Try mini bag of popcorn for a snack. Limit amount of sweets kept at home.  EDUCATION MATERIALS GIVEN:  . Snacking handout . Goals/ instructions  LEARNER/ who was taught:  . Patient  . [name] Coach: T.T.  LEVEL OF UNDERSTANDING: . Partial understanding; needs review/ practice Demonstrated limited degree of understanding via teach back.  LEARNING BARRIERS: . Cognitive limitations WILLINGNESS TO LEARN/READINESS FOR CHANGE: . Hesitance, contemplating change  MONITORING AND EVALUATION:  Weight, diet, exercise Follow-up: 03/08/2017 at 1:30pm

## 2017-02-08 NOTE — Patient Instructions (Addendum)
Establish a meal pattern of 3 meals and 2 snacks. 7:00am 12:00noon 3:00pm, 6:00pm, 8:00 snack Gym- 2 days per week for 30 minutes. Walk for exercise with a staff person at least 3 days per week. Talk with staff about dinner plans so they can help prepare. Try mini bag of popcorn for a snack. Limit amount of sweets kept at home.

## 2017-02-09 DIAGNOSIS — E6609 Other obesity due to excess calories: Secondary | ICD-10-CM | POA: Diagnosis not present

## 2017-02-09 DIAGNOSIS — K5900 Constipation, unspecified: Secondary | ICD-10-CM | POA: Diagnosis not present

## 2017-02-09 DIAGNOSIS — F331 Major depressive disorder, recurrent, moderate: Secondary | ICD-10-CM | POA: Diagnosis not present

## 2017-02-09 DIAGNOSIS — E041 Nontoxic single thyroid nodule: Secondary | ICD-10-CM | POA: Diagnosis not present

## 2017-02-09 DIAGNOSIS — Z23 Encounter for immunization: Secondary | ICD-10-CM | POA: Diagnosis not present

## 2017-02-09 DIAGNOSIS — G43009 Migraine without aura, not intractable, without status migrainosus: Secondary | ICD-10-CM | POA: Diagnosis not present

## 2017-02-11 ENCOUNTER — Ambulatory Visit (INDEPENDENT_AMBULATORY_CARE_PROVIDER_SITE_OTHER): Payer: Medicare Other | Admitting: Psychiatry

## 2017-02-11 ENCOUNTER — Other Ambulatory Visit: Payer: Self-pay

## 2017-02-11 ENCOUNTER — Encounter: Payer: Self-pay | Admitting: Psychiatry

## 2017-02-11 VITALS — BP 111/73 | HR 102 | Temp 97.6°F | Wt 213.0 lb

## 2017-02-11 DIAGNOSIS — F4310 Post-traumatic stress disorder, unspecified: Secondary | ICD-10-CM | POA: Diagnosis not present

## 2017-02-11 DIAGNOSIS — F79 Unspecified intellectual disabilities: Secondary | ICD-10-CM

## 2017-02-11 DIAGNOSIS — F331 Major depressive disorder, recurrent, moderate: Secondary | ICD-10-CM | POA: Diagnosis not present

## 2017-02-11 MED ORDER — TRAZODONE HCL 50 MG PO TABS: 50.0000 mg | ORAL_TABLET | Freq: Every day | ORAL | 3 refills | Status: DC

## 2017-02-11 MED ORDER — DULOXETINE HCL 30 MG PO CPEP: 30.0000 mg | ORAL_CAPSULE | Freq: Every day | ORAL | 3 refills | Status: DC

## 2017-02-11 NOTE — Progress Notes (Signed)
Patient ID: Teresa Crawford, female   DOB: Apr 19, 1985, 31 y.o.   MRN: [id number]  [facility] MD/PA/NP OP Progress Note  02/11/2017 10:05 AM ARMYA WESTERHOFF  MRN:  [id number]  Subjective:  Follow-up of her posttraumatic stress disorder and major depressive disorder, recurrent moderate. Reports doing well currently. Patient was seen with her group home worker. Reports that the Cymbalta at 30 mg has been good. She misses going to her day program and will start going to [employer] day program on December the 3rd.  Fair sleep and appetite. She was very anxious about starting school and has dropped the idea.  Chief Complaint: Doing well Chief Complaint    Follow-up; Medication Refill     Visit Diagnosis:     ICD-10-CM   1. Major depressive disorder, recurrent episode, moderate (HCC) F33.1   2. PTSD (post-traumatic stress disorder) F43.10   3. Intellectual disability F14     Past Medical History:  Past Medical History:  Diagnosis Date  . Chronic back pain   . Chronic fatigue syndrome   . Depression   . Elevated liver enzymes   . Endometriosis   . Hepatic steatosis   . Hypothyroid   . Last menstrual period (LMP) > 10 days ago 09/12/15  . Mental retardation   . Migraine   . Posttraumatic stress disorder   . PUD (peptic ulcer disease)   . Scoliosis   . Splenomegaly   . Uterine fibroid     Past Surgical History:  Procedure Laterality Date  . ABDOMINAL HYSTERECTOMY    . BACK SURGERY    . COLONOSCOPY  04/27/2006   Wohl-colitis, internal hemorrhoids  . COLONOSCOPY  02/2013   focal right colitis, left colon biopsy negative, focal active proctitis without chronicity  . ESOPHAGOGASTRODUODENOSCOPY  02/2013   multiple peptic & duodenal ulcers, negative h pylori  . LAPAROSCOPIC ENDOMETRIOSIS FULGURATION  2015   [name]   Family History:  Family History  Problem Relation Age of Onset  . Breast cancer Mother 17  . COPD Father   . Lung cancer Father 82  . Dementia Father   . Diabetes  Sister   . Diabetes Brother   . Ovarian cancer Paternal Aunt   . Colon cancer Maternal Grandfather 42  . Liver disease Maternal Aunt        ?etiology  . Lymphoma Maternal Grandmother 60  . Lung cancer Other 50       maternal great grandfather  . Stomach cancer Other 81       maternal great grandmother   Social History:  Social History   Socioeconomic History  . Marital status: Single    Spouse name: None  . Number of children: 0  . Years of education: None  . Highest education level: None  Social Needs  . Financial resource strain: Not hard at all  . Food insecurity - worry: Never true  . Food insecurity - inability: Never true  . Transportation needs - medical: No  . Transportation needs - non-medical: No  Occupational History  . None  Tobacco Use  . Smoking status: Never Smoker  . Smokeless tobacco: Never Used  Substance and Sexual Activity  . Alcohol use: Yes    Alcohol/week: 0.0 oz    Comment: occ  . Drug use: No  . Sexual activity: Not Currently  Other Topics Concern  . None  Social History Narrative   She has brother, half brother & sister live nearby   Both parents deceased  Care managed by [name]   Additional History:   Assessment:   Musculoskeletal: Strength & Muscle Tone: within normal limits Gait & Station: normal Patient leans: N/A  Psychiatric Specialty Exam: Medication Refill   Depression         Associated symptoms include does not have insomnia and no suicidal ideas.   Review of Systems  Endo/Heme/Allergies:       Patient reports that have an ultrasound of her thyroid.  Psychiatric/Behavioral: Negative for depression, hallucinations, memory loss, substance abuse and suicidal ideas. The patient is not nervous/anxious and does not have insomnia.   All other systems reviewed and are negative.   Last menstrual period 01/05/2016.There is no height or weight on file to calculate BMI.  General Appearance: Fairly Groomed  Eye Contact:   Fair  Speech:  Clear and Coherent and Normal Rate  Volume:  Normal  Mood:  Good  Affect:  Congruent   Thought Process:  Concrete  Orientation:  Full (Time, Place, and Person)  Thought Content:  Negative  Suicidal Thoughts:  No  Homicidal Thoughts:  No  Memory:  Immediate;   Good Recent;   Good Remote;   Good  Judgement:  Fair  Insight:  Fair  Psychomotor Activity:  Negative  Concentration:  Good  Recall:  Good  Fund of Knowledge: Fair  Language: Fair  Akathisia:  Negative  Handed:  Right unknown  AIMS (if indicated):  Not done  Assets:  Social Support  ADL's:  Intact  Cognition: Impaired,  Moderate  Sleep:  Good    Is the patient at risk to self?  No. Has the patient been a risk to self in the past 6 months?  No. Has the patient been a risk to self within the distant past?  No. Is the patient a risk to others?  No. Has the patient been a risk to others in the past 6 months?  No. Has the patient been a risk to others within the distant past?  No.  Current Medications: Current Outpatient Medications  Medication Sig Dispense Refill  . cetirizine (ZYRTEC) 10 MG tablet Take 10 mg by mouth at bedtime.     . diazepam (VALIUM) 5 MG tablet Take 1 tablet (5 mg total) by mouth at bedtime. Inserts tablet vaginally. 10 tablet 0  . diphenhydrAMINE (BENADRYL) 25 mg capsule Take 25 mg by mouth every 6 (six) hours as needed for allergies.    Marland Kitchen docusate sodium (COLACE) 100 MG capsule Take 100 mg by mouth 2 (two) times daily.     . DULoxetine (CYMBALTA) 20 MG capsule Take 20 mg by mouth daily.    Marland Kitchen EPINEPHrine (EPIPEN 2-PAK) 0.3 mg/0.3 mL IJ SOAJ injection Inject 0.3 mg into the muscle once as needed. For allergic reaction    . fluticasone (FLONASE) 50 MCG/ACT nasal spray Place 1 spray into both nostrils daily.    Marland Kitchen ketorolac (TORADOL) 10 MG tablet Take 1 tablet (10 mg total) by mouth every 6 (six) hours as needed. (Patient taking differently: Take 10 mg by mouth every 6 (six) hours as  needed for moderate pain. ) 20 tablet 0  . norethindrone (AYGESTIN) 5 MG tablet Take 5 mg by mouth daily.    Marland Kitchen omeprazole (PRILOSEC) 20 MG capsule Take 20 mg by mouth 2 (two) times daily before a meal.     . ondansetron (ZOFRAN-ODT) 8 MG disintegrating tablet Take 8 mg by mouth daily as needed for nausea.    . tamsulosin (FLOMAX) 0.4  MG CAPS capsule Take 1 capsule (0.4 mg total) by mouth daily. 30 capsule 0  . topiramate (TOPAMAX) 50 MG tablet Take 25 mg by mouth at bedtime.     . traMADol (ULTRAM) 50 MG tablet Take 2 tablets (100 mg total) by mouth every 6 (six) hours as needed for moderate pain. 20 tablet 0  . traZODone (DESYREL) 50 MG tablet Take 1 tablet (50 mg total) by mouth at bedtime. 30 tablet 3   No current facility-administered medications for this visit.     Medical Decision Making:  Established Problem, Stable/Improving (1)  Treatment Plan Summary:Medication management   Post traumatic stress disorder- Continue Cymbalta to 30 mg daily   Major depressive disorder, recurrent, moderate- Same as above  Insomnia- Continue to take Trazodone at 50mg  po qhs.  Patient is also on Valium prescribed by another doctor for pelvic muscle spasms.  Patient will return in  4  months. She has  been encouraged call with any questions or concerns prior to her next appointment.  Daylah Sayavong 02/11/2017, 10:05 AM

## 2017-03-01 DIAGNOSIS — E041 Nontoxic single thyroid nodule: Secondary | ICD-10-CM | POA: Diagnosis not present

## 2017-03-08 ENCOUNTER — Ambulatory Visit: Payer: Self-pay | Admitting: Dietician

## 2017-03-11 DIAGNOSIS — R569 Unspecified convulsions: Secondary | ICD-10-CM | POA: Diagnosis not present

## 2017-03-16 ENCOUNTER — Ambulatory Visit: Payer: Self-pay | Admitting: Dietician

## 2017-03-25 ENCOUNTER — Encounter: Payer: Self-pay | Admitting: Dietician

## 2017-03-31 DIAGNOSIS — R569 Unspecified convulsions: Secondary | ICD-10-CM | POA: Insufficient documentation

## 2017-04-07 ENCOUNTER — Other Ambulatory Visit: Payer: Self-pay | Admitting: Internal Medicine

## 2017-04-16 ENCOUNTER — Encounter: Payer: Self-pay | Admitting: Nurse Practitioner

## 2017-04-16 ENCOUNTER — Other Ambulatory Visit: Payer: Self-pay

## 2017-04-16 ENCOUNTER — Ambulatory Visit (INDEPENDENT_AMBULATORY_CARE_PROVIDER_SITE_OTHER): Payer: Medicare Other | Admitting: Nurse Practitioner

## 2017-04-16 VITALS — BP 112/80 | HR 94 | Temp 97.9°F | Resp 16 | Ht 65.0 in | Wt 216.0 lb

## 2017-04-16 DIAGNOSIS — E041 Nontoxic single thyroid nodule: Secondary | ICD-10-CM | POA: Insufficient documentation

## 2017-04-16 DIAGNOSIS — G43009 Migraine without aura, not intractable, without status migrainosus: Secondary | ICD-10-CM | POA: Diagnosis not present

## 2017-04-16 DIAGNOSIS — K5900 Constipation, unspecified: Secondary | ICD-10-CM | POA: Insufficient documentation

## 2017-04-16 DIAGNOSIS — E6609 Other obesity due to excess calories: Secondary | ICD-10-CM | POA: Insufficient documentation

## 2017-04-16 DIAGNOSIS — J029 Acute pharyngitis, unspecified: Secondary | ICD-10-CM

## 2017-04-16 DIAGNOSIS — J020 Streptococcal pharyngitis: Secondary | ICD-10-CM

## 2017-04-16 LAB — POCT RAPID STREP A (OFFICE): Rapid Strep A Screen: NEGATIVE

## 2017-04-16 MED ORDER — AZITHROMYCIN 250 MG PO TABS: ORAL_TABLET | ORAL | 0 refills | Status: DC

## 2017-04-16 NOTE — Progress Notes (Signed)
[facility] [place], [place] [address]  Internal MEDICINE  Office Visit Note  Patient Name: Teresa Crawford  [id number]  [id number]  Date of Service: 04/16/2017  Complaints/HPI:  Pt is here for a sick visit.  Chief Complaint  Patient presents with  . Sore Throat    problems with swallowing   Other  This is a new problem. The current episode started in the past 7 days. The problem occurs constantly. The problem has been gradually worsening. Associated symptoms include congestion, coughing, fatigue, headaches and a sore throat. Pertinent negatives include no arthralgias, chest pain, fever, myalgias, nausea or vomiting. The symptoms are aggravated by swallowing. She has tried nothing for the symptoms.   Current Medication:  Outpatient Encounter Medications as of 04/16/2017  Medication Sig  . benzonatate (TESSALON) 200 MG capsule Take 200 mg by mouth every 8 (eight) hours as needed for cough.  . cetirizine (ZYRTEC) 10 MG tablet Take 10 mg by mouth at bedtime.   . diazepam (VALIUM) 5 MG tablet Take 1 tablet (5 mg total) by mouth at bedtime. Inserts tablet vaginally.  . docusate sodium (COLACE) 100 MG capsule TAKE 1 CAPSULE BY MOUTH TWICE DAILY FOR STOOL SOFTENER  . DULoxetine (CYMBALTA) 30 MG capsule Take 1 capsule (30 mg total) daily by mouth.  . EPINEPHrine (EPIPEN 2-PAK) 0.3 mg/0.3 mL IJ SOAJ injection Inject 0.3 mg into the muscle once as needed. For allergic reaction  . fluticasone (FLONASE) 50 MCG/ACT nasal spray Place 1 spray into both nostrils daily.  . norethindrone (AYGESTIN) 5 MG tablet Take 5 mg by mouth daily.  Marland Kitchen omeprazole (PRILOSEC) 20 MG capsule Take 20 mg by mouth 2 (two) times daily before a meal.   . ondansetron (ZOFRAN-ODT) 8 MG disintegrating tablet Take 8 mg by mouth 3 (three) times daily.   . polyethylene glycol (MIRALAX / GLYCOLAX) packet Take 17 g by mouth daily as needed. Take one cap of full in water qd prn only of constipation  .  SUMAtriptan (IMITREX) 50 MG tablet Take 50 mg by mouth once. Take 1 tab at the onshet of headache with zofran  . topiramate (TOPAMAX) 50 MG tablet Take 25 mg by mouth at bedtime.   . traMADol (ULTRAM) 50 MG tablet Take 2 tablets (100 mg total) by mouth every 6 (six) hours as needed for moderate pain.  . traZODone (DESYREL) 50 MG tablet Take 1 tablet (50 mg total) at bedtime by mouth.  . diphenhydrAMINE (BENADRYL) 25 mg capsule Take 25 mg by mouth every 6 (six) hours as needed for allergies.  Marland Kitchen ketorolac (TORADOL) 10 MG tablet Take 1 tablet (10 mg total) by mouth every 6 (six) hours as needed. (Patient not taking: Reported on 04/16/2017)  . tamsulosin (FLOMAX) 0.4 MG CAPS capsule Take 1 capsule (0.4 mg total) by mouth daily.   No facility-administered encounter medications on file as of 04/16/2017.       Medical History: Past Medical History:  Diagnosis Date  . B12 deficiency anemia   . Chronic back pain   . Chronic fatigue syndrome   . Constipation   . Depression   . Elevated liver enzymes   . Endometriosis   . Fibroids   . Hepatic steatosis   . Hypothyroid   . Last menstrual period (LMP) > 10 days ago 09/12/15  . Mental retardation   . Migraine   . Post traumatic stress disorder   . Posttraumatic stress disorder   . PUD (peptic ulcer disease)   .  Scoliosis   . Splenomegaly   . Uterine fibroid      Today's Vitals   04/16/17 1219  BP: 112/80  Pulse: 94  Resp: 16  Temp: 97.9 F (36.6 C)  SpO2: 98%  Weight: 216 lb (98 kg)  Height: 5\' 5"  (1.651 m)    Review of Systems  Constitutional: Positive for fatigue. Negative for fever.  HENT: Positive for congestion, postnasal drip, rhinorrhea and sore throat.   Respiratory: Positive for cough. Negative for wheezing.   Cardiovascular: Negative for chest pain and palpitations.  Gastrointestinal: Negative for nausea and vomiting.  Endocrine: Negative.   Musculoskeletal: Negative for arthralgias and myalgias.  Skin: Negative.    Allergic/Immunologic: Positive for environmental allergies.  Neurological: Positive for headaches.  Hematological: Negative.   Psychiatric/Behavioral: Negative for behavioral problems. The patient is not nervous/anxious.     Physical Exam  Constitutional: She is oriented to person, place, and time. She appears well-developed and well-nourished. No distress.  HENT:  Head: Normocephalic and atraumatic.  Right Ear: External ear normal.  Left Ear: External ear normal.  Nose: Rhinorrhea present. Right sinus exhibits no maxillary sinus tenderness and no frontal sinus tenderness. Left sinus exhibits no maxillary sinus tenderness and no frontal sinus tenderness.  Mouth/Throat: Posterior oropharyngeal erythema present. No oropharyngeal exudate.  Eyes: EOM are normal. Pupils are equal, round, and reactive to light.  Neck: Normal range of motion. Neck supple. No JVD present. No tracheal deviation present. No thyromegaly present.  Cardiovascular: Normal rate, regular rhythm and normal heart sounds. Exam reveals no gallop and no friction rub.  No murmur heard. Pulmonary/Chest: Effort normal and breath sounds normal. No respiratory distress. She has no wheezes. She has no rales. She exhibits no tenderness.  Abdominal: Soft. Bowel sounds are normal. There is no tenderness.  Musculoskeletal: Normal range of motion.  Lymphadenopathy:    She has cervical adenopathy.  Neurological: She is alert and oriented to person, place, and time. No cranial nerve deficit.  Skin: Skin is warm and dry. She is not diaphoretic.  Psychiatric: She has a normal mood and affect. Her behavior is normal. Judgment and thought content normal.  Nursing note and vitals reviewed.   Assessment/Plan:  1. Acute pharyngitis, unspecified etiology - azithromycin (ZITHROMAX) 250 MG tablet; z-pack - take as directed for 5 days  Dispense: 6 tablet; Refill: 0 Recommend using tylenol and prescribed ibuprofen as needed and as indicated  for body aches and headache.  2. Strep throat - POCT rapid strep A - strep screen negative. cepacol throat lozenges given to patient.   3. Migraine without aura, not intractable, without status migrainosus Continue to take prescribed NSAIDs as needed and as prescribed.    General Counseling : I have discussed the findings of the evaluation and examination with [name].  I have also discussed any further diagnostic evaluation that may be needed or ordered today. Kenniya verbalizes understanding of the findings of todays visit. We also reviewed her medications today. she has been encouraged to call the office with any questions or concerns that should arise related to todays visit.  This patient was seen by Leretha Pol, FNP- C in Collaboration with Dr Lavera Guise as a part of collaborative care agreement    Time spent: 15 minutes

## 2017-04-21 ENCOUNTER — Encounter: Payer: Self-pay | Admitting: Nurse Practitioner

## 2017-05-07 ENCOUNTER — Other Ambulatory Visit: Payer: Self-pay | Admitting: Internal Medicine

## 2017-05-10 ENCOUNTER — Other Ambulatory Visit: Payer: Self-pay

## 2017-05-10 MED ORDER — CETIRIZINE HCL 10 MG PO TABS: 10.0000 mg | ORAL_TABLET | Freq: Every day | ORAL | 3 refills | Status: DC

## 2017-05-10 NOTE — Telephone Encounter (Signed)
Can you send her diazepam please last 1/19

## 2017-05-11 ENCOUNTER — Ambulatory Visit: Payer: Self-pay | Admitting: Nurse Practitioner

## 2017-05-12 ENCOUNTER — Ambulatory Visit (INDEPENDENT_AMBULATORY_CARE_PROVIDER_SITE_OTHER): Payer: Medicare Other | Admitting: Internal Medicine

## 2017-05-12 ENCOUNTER — Encounter: Payer: Self-pay | Admitting: Internal Medicine

## 2017-05-12 VITALS — BP 128/81 | HR 98 | Temp 98.5°F | Resp 16 | Ht 65.0 in | Wt 217.4 lb

## 2017-05-12 DIAGNOSIS — R1310 Dysphagia, unspecified: Secondary | ICD-10-CM | POA: Diagnosis not present

## 2017-05-12 DIAGNOSIS — R059 Cough, unspecified: Secondary | ICD-10-CM

## 2017-05-12 DIAGNOSIS — J029 Acute pharyngitis, unspecified: Secondary | ICD-10-CM | POA: Diagnosis not present

## 2017-05-12 DIAGNOSIS — R05 Cough: Secondary | ICD-10-CM

## 2017-05-12 LAB — POCT RAPID STREP A (OFFICE): Rapid Strep A Screen: NEGATIVE

## 2017-05-12 MED ORDER — PREDNISONE 10 MG PO TABS: 10.0000 mg | ORAL_TABLET | Freq: Every day | ORAL | 0 refills | Status: DC

## 2017-05-12 MED ORDER — ACETAMINOPHEN-CODEINE 300-30 MG PO TABS: ORAL_TABLET | ORAL | 0 refills | Status: DC

## 2017-05-12 NOTE — Progress Notes (Signed)
[facility] [place], [place] [address]  Internal MEDICINE  Office Visit Note  Patient Name: Teresa Crawford  [id number]  [id number]  Date of Service: 05/12/2017  Chief Complaint  Patient presents with  . Cough    trouble breathing when coughing can't lay flat when sleeping.  started saturday  . Sore Throat     HPI Pt is here for a sick visit. Cough and sore throat since Saturday Pt has taken cough meds otc with no relief, she did take Zpak  Current Medication:  Outpatient Encounter Medications as of 05/12/2017  Medication Sig  . azithromycin (ZITHROMAX) 250 MG tablet z-pack - take as directed for 5 days  . benzonatate (TESSALON) 200 MG capsule Take 200 mg by mouth every 8 (eight) hours as needed for cough.  . cetirizine (ZYRTEC) 10 MG tablet Take 1 tablet (10 mg total) by mouth daily.  . diazepam (VALIUM) 5 MG tablet INSERT 1 TABLET VAGINALLY ONCE DAILY AT BEDTIME  . diphenhydrAMINE (BENADRYL) 25 mg capsule Take 25 mg by mouth every 6 (six) hours as needed for allergies.  Marland Kitchen docusate sodium (COLACE) 100 MG capsule TAKE 1 CAPSULE BY MOUTH TWICE DAILY FOR STOOL SOFTENER  . DULoxetine (CYMBALTA) 30 MG capsule Take 1 capsule (30 mg total) daily by mouth.  . EPINEPHrine (EPIPEN 2-PAK) 0.3 mg/0.3 mL IJ SOAJ injection Inject 0.3 mg into the muscle once as needed. For allergic reaction  . fluticasone (FLONASE) 50 MCG/ACT nasal spray Place 1 spray into both nostrils daily.  . norethindrone (AYGESTIN) 5 MG tablet Take 5 mg by mouth daily.  Marland Kitchen omeprazole (PRILOSEC) 20 MG capsule Take 20 mg by mouth 2 (two) times daily before a meal.   . omeprazole (PRILOSEC) 40 MG capsule TAKE 1 CAPSULE BY MOUTH TWICE DAILY  . ondansetron (ZOFRAN-ODT) 8 MG disintegrating tablet Take 8 mg by mouth 3 (three) times daily.   . polyethylene glycol (MIRALAX / GLYCOLAX) packet Take 17 g by mouth daily as needed. Take one cap of full in water qd prn only of constipation  . SUMAtriptan  (IMITREX) 50 MG tablet Take 50 mg by mouth once. Take 1 tab at the onshet of headache with zofran  . tamsulosin (FLOMAX) 0.4 MG CAPS capsule Take 1 capsule (0.4 mg total) by mouth daily.  Marland Kitchen topiramate (TOPAMAX) 50 MG tablet Take 25 mg by mouth at bedtime.   . traMADol (ULTRAM) 50 MG tablet Take 2 tablets (100 mg total) by mouth every 6 (six) hours as needed for moderate pain.  . traZODone (DESYREL) 50 MG tablet Take 1 tablet (50 mg total) at bedtime by mouth.  . [DISCONTINUED] ketorolac (TORADOL) 10 MG tablet Take 1 tablet (10 mg total) by mouth every 6 (six) hours as needed. (Patient not taking: Reported on 04/16/2017)   No facility-administered encounter medications on file as of 05/12/2017.       Medical History: Past Medical History:  Diagnosis Date  . B12 deficiency anemia   . Chronic back pain   . Chronic fatigue syndrome   . Constipation   . Depression   . Elevated liver enzymes   . Endometriosis   . Fibroids   . Hepatic steatosis   . Hypothyroid   . Last menstrual period (LMP) > 10 days ago 09/12/15  . Mental retardation   . Migraine   . Post traumatic stress disorder   . Posttraumatic stress disorder   . PUD (peptic ulcer disease)   . Scoliosis   .  Splenomegaly   . Uterine fibroid      Vital Signs: BP 128/81 (BP Location: Left Arm, Patient Position: Sitting)   Pulse 98   Temp 98.5 F (36.9 C) (Oral)   Resp 16   Ht 5\' 5"  (1.651 m)   Wt 217 lb 6.4 oz (98.6 kg)   LMP 01/05/2016   SpO2 96%   BMI 36.18 kg/m    Review of Systems  Constitutional: Negative for chills, fatigue and unexpected weight change.  HENT: Positive for postnasal drip. Negative for congestion, rhinorrhea, sneezing and sore throat.   Eyes: Negative for redness.  Respiratory: Positive for cough and choking. Negative for chest tightness and shortness of breath.   Cardiovascular: Negative for chest pain and palpitations.  Gastrointestinal: Negative for abdominal pain, constipation, diarrhea,  nausea and vomiting.  Genitourinary: Negative for dysuria and frequency.  Musculoskeletal: Positive for myalgias.  Psychiatric/Behavioral: Negative for behavioral problems (Depression), sleep disturbance and suicidal ideas. The patient is not nervous/anxious.     Physical Exam  Constitutional: She appears well-developed and well-nourished. No distress.  HENT:  Head: Normocephalic and atraumatic.  Mouth/Throat: Oropharynx is clear and moist. No oropharyngeal exudate.  Eyes: EOM are normal. Pupils are equal, round, and reactive to light.  Neck: Normal range of motion. Neck supple.  Cardiovascular: Normal rate and regular rhythm.  Pulmonary/Chest: Effort normal. No respiratory distress. She has wheezes. She has no rales.  Abdominal: Soft.  Neurological: No cranial nerve deficit.  Skin: Skin is warm. She is not diaphoretic.  Psychiatric: She has a normal mood and affect. Her behavior is normal. Judgment and thought content normal.    Assessment/Plan: 1. Acute pharyngitis, unspecified etiology - POCT rapid strep A . Negative   2. Dysphagia, unspecified type - Gargles with salt water, soft diet  3. Cough - predniSONE (DELTASONE) 10 MG tablet; Take 1 tablet (10 mg total) by mouth daily with breakfast.  Dispense: 6 tablet; Refill: 0 - Acetaminophen-Codeine (TYLENOL/CODEINE #3) 300-30 MG tablet; One tab po 2  Day for cough pain  Dispense: 15 tablet; Refill: 0  General Counseling: Jaslyn verbalizes understanding of the findings of todays visit and agrees with plan of treatment. I have discussed any further diagnostic evaluation that may be needed or ordered today. We also reviewed her medications today. she has been encouraged to call the office with any questions or concerns that should arise related to todays visit.   Time spent: 20 Minutes

## 2017-05-19 ENCOUNTER — Ambulatory Visit (INDEPENDENT_AMBULATORY_CARE_PROVIDER_SITE_OTHER): Payer: Medicare Other | Admitting: Internal Medicine

## 2017-05-19 ENCOUNTER — Encounter: Payer: Self-pay | Admitting: Internal Medicine

## 2017-05-19 VITALS — BP 123/88 | HR 124 | Resp 16 | Ht 65.0 in | Wt 220.4 lb

## 2017-05-19 DIAGNOSIS — F3342 Major depressive disorder, recurrent, in full remission: Secondary | ICD-10-CM

## 2017-05-19 DIAGNOSIS — R062 Wheezing: Secondary | ICD-10-CM

## 2017-05-19 DIAGNOSIS — R05 Cough: Secondary | ICD-10-CM

## 2017-05-19 DIAGNOSIS — R059 Cough, unspecified: Secondary | ICD-10-CM

## 2017-05-19 MED ORDER — ALBUTEROL SULFATE HFA 108 (90 BASE) MCG/ACT IN AERS
2.0000 | INHALATION_SPRAY | Freq: Four times a day (QID) | RESPIRATORY_TRACT | 0 refills | Status: DC | PRN
Start: 2017-05-19 — End: 2017-06-17

## 2017-05-19 MED ORDER — IPRATROPIUM-ALBUTEROL 0.5-2.5 (3) MG/3ML IN SOLN
3.0000 mL | Freq: Once | RESPIRATORY_TRACT | Status: AC
  Administered 2017-05-19: 3 mL via RESPIRATORY_TRACT

## 2017-06-03 ENCOUNTER — Other Ambulatory Visit: Payer: Self-pay | Admitting: Internal Medicine

## 2017-06-04 ENCOUNTER — Other Ambulatory Visit: Payer: Self-pay

## 2017-06-04 DIAGNOSIS — J029 Acute pharyngitis, unspecified: Secondary | ICD-10-CM

## 2017-06-04 MED ORDER — FLUTICASONE PROPIONATE 50 MCG/ACT NA SUSP: 1.0000 | Freq: Every day | NASAL | 3 refills | Status: DC

## 2017-06-04 NOTE — Telephone Encounter (Signed)
Pt called when she cough her chest hurts and no wheezing and no mucus as per dr Humphrey Rolls send flovent 220  2 puff twice  A day rinse a mooth /np

## 2017-06-07 ENCOUNTER — Telehealth: Payer: Self-pay

## 2017-06-07 DIAGNOSIS — F4310 Post-traumatic stress disorder, unspecified: Secondary | ICD-10-CM

## 2017-06-07 DIAGNOSIS — F331 Major depressive disorder, recurrent, moderate: Secondary | ICD-10-CM

## 2017-06-07 NOTE — Telephone Encounter (Signed)
Medication refill request - Faxes received from patient's [facility] for refills of her prescribed Trazodone and Duloxetine, both last ordered 02/11/17 + 3 refills and pt. returns 06/17/17.

## 2017-06-08 MED ORDER — DULOXETINE HCL 30 MG PO CPEP: 30.0000 mg | ORAL_CAPSULE | Freq: Every day | ORAL | 0 refills | Status: DC

## 2017-06-08 MED ORDER — TRAZODONE HCL 50 MG PO TABS: 50.0000 mg | ORAL_TABLET | Freq: Every day | ORAL | 0 refills | Status: DC

## 2017-06-08 NOTE — Telephone Encounter (Signed)
A new one time order for 30 days each of patient's prescribed Trazodone and Duloxetine e-scribed to patient's [facility] as ordered by Dr. Einar Grad this date.

## 2017-06-08 NOTE — Telephone Encounter (Signed)
Please call in refill for  a month, she is a reliable patient and compliant.

## 2017-06-09 ENCOUNTER — Other Ambulatory Visit: Payer: Self-pay | Admitting: Internal Medicine

## 2017-06-10 ENCOUNTER — Ambulatory Visit: Payer: Self-pay | Admitting: Psychiatry

## 2017-06-10 ENCOUNTER — Ambulatory Visit: Payer: Medicare Other | Admitting: Psychiatry

## 2017-06-10 ENCOUNTER — Other Ambulatory Visit: Payer: Self-pay

## 2017-06-10 MED ORDER — FLUTICASONE PROPIONATE HFA 220 MCG/ACT IN AERO: INHALATION_SPRAY | RESPIRATORY_TRACT | 5 refills | Status: DC

## 2017-06-14 ENCOUNTER — Other Ambulatory Visit: Payer: Self-pay

## 2017-06-14 MED ORDER — EPINEPHRINE 0.3 MG/0.3ML IJ SOAJ: INTRAMUSCULAR | 5 refills | Status: DC

## 2017-06-14 NOTE — Progress Notes (Signed)
[facility] [place], [place] [address]  Internal MEDICINE  Office Visit Note  Patient Name: Teresa Crawford  [id number]  [id number]  Date of Service: 06/14/2017  Chief Complaint  Patient presents with  . Follow-up    test results thyroid  . Cough    with wheezing     HPI Pt is here for a sick visit. She has not been feeling well with her cough and wheezing. She does have Albuterol  But does not use on a regular basis, no h/o asthma  Thyroid u/s is normal with minor nodules. Continues to gain weight. She does have major depression and PTSD   Current Medication:  Outpatient Encounter Medications as of 05/19/2017  Medication Sig  . Acetaminophen-Codeine (TYLENOL/CODEINE #3) 300-30 MG tablet One tab po 2  Day for cough pain  . albuterol (PROVENTIL HFA;VENTOLIN HFA) 108 (90 Base) MCG/ACT inhaler Inhale 2 puffs into the lungs every 6 (six) hours as needed for wheezing or shortness of breath.  Marland Kitchen azithromycin (ZITHROMAX) 250 MG tablet z-pack - take as directed for 5 days  . benzonatate (TESSALON) 200 MG capsule Take 200 mg by mouth every 8 (eight) hours as needed for cough.  . cetirizine (ZYRTEC) 10 MG tablet Take 1 tablet (10 mg total) by mouth daily.  . diazepam (VALIUM) 5 MG tablet INSERT 1 TABLET VAGINALLY ONCE DAILY AT BEDTIME  . diphenhydrAMINE (BENADRYL) 25 mg capsule Take 25 mg by mouth every 6 (six) hours as needed for allergies.  Marland Kitchen docusate sodium (COLACE) 100 MG capsule TAKE 1 CAPSULE BY MOUTH TWICE DAILY FOR STOOL SOFTENER  . omeprazole (PRILOSEC) 20 MG capsule Take 20 mg by mouth 2 (two) times daily before a meal.   . omeprazole (PRILOSEC) 40 MG capsule TAKE 1 CAPSULE BY MOUTH TWICE DAILY  . ondansetron (ZOFRAN-ODT) 8 MG disintegrating tablet Take 8 mg by mouth 3 (three) times daily.   . polyethylene glycol (MIRALAX / GLYCOLAX) packet Take 17 g by mouth daily as needed. Take one cap of full in water qd prn only of constipation  . predniSONE  (DELTASONE) 10 MG tablet Take 1 tablet (10 mg total) by mouth daily with breakfast.  . SUMAtriptan (IMITREX) 50 MG tablet Take 50 mg by mouth once. Take 1 tab at the onshet of headache with zofran  . tamsulosin (FLOMAX) 0.4 MG CAPS capsule Take 1 capsule (0.4 mg total) by mouth daily.  Marland Kitchen topiramate (TOPAMAX) 50 MG tablet Take 25 mg by mouth at bedtime.   . traMADol (ULTRAM) 50 MG tablet Take 2 tablets (100 mg total) by mouth every 6 (six) hours as needed for moderate pain.  . [DISCONTINUED] DULoxetine (CYMBALTA) 30 MG capsule Take 1 capsule (30 mg total) daily by mouth.  . [DISCONTINUED] EPINEPHrine (EPIPEN 2-PAK) 0.3 mg/0.3 mL IJ SOAJ injection Inject 0.3 mg into the muscle once as needed. For allergic reaction  . [DISCONTINUED] fluticasone (FLONASE) 50 MCG/ACT nasal spray Place 1 spray into both nostrils daily.  . [DISCONTINUED] norethindrone (AYGESTIN) 5 MG tablet Take 5 mg by mouth daily.  . [DISCONTINUED] traZODone (DESYREL) 50 MG tablet Take 1 tablet (50 mg total) at bedtime by mouth.  . [EXPIRED] ipratropium-albuterol (DUONEB) 0.5-2.5 (3) MG/3ML nebulizer solution 3 mL    No facility-administered encounter medications on file as of 05/19/2017.       Medical History: Past Medical History:  Diagnosis Date  . B12 deficiency anemia   . Chronic back pain   . Chronic fatigue syndrome   .  Constipation   . Depression   . Elevated liver enzymes   . Endometriosis   . Fibroids   . Hepatic steatosis   . Hypothyroid   . Last menstrual period (LMP) > 10 days ago 09/12/15  . Mental retardation   . Migraine   . Post traumatic stress disorder   . Posttraumatic stress disorder   . PUD (peptic ulcer disease)   . Scoliosis   . Splenomegaly   . Uterine fibroid      Vital Signs: BP 123/88 (BP Location: Left Arm, Patient Position: Sitting)   Pulse (!) 124   Resp 16   Ht 5\' 5"  (1.651 m)   Wt 220 lb 6.4 oz (100 kg)   LMP 01/05/2016   SpO2 99%   BMI 36.68 kg/m    Review of Systems   Constitutional: Negative for chills, fatigue and unexpected weight change.  HENT: Positive for postnasal drip and sore throat. Negative for congestion, rhinorrhea and sneezing.   Eyes: Negative for redness.  Respiratory: Positive for cough and wheezing. Negative for chest tightness and shortness of breath.   Cardiovascular: Negative for chest pain and palpitations.  Gastrointestinal: Negative for abdominal pain, diarrhea, nausea and vomiting.  Genitourinary: Negative for dysuria and frequency.  Musculoskeletal: Positive for back pain. Negative for myalgias.  Skin: Negative for rash.  Neurological: Negative.  Negative for tremors and numbness.  Hematological: Negative for adenopathy. Does not bruise/bleed easily.  Psychiatric/Behavioral: Negative for behavioral problems (Depression), sleep disturbance and suicidal ideas. The patient is not nervous/anxious.     Physical Exam  Constitutional: She is oriented to person, place, and time. She appears well-developed and well-nourished.  HENT:  Head: Normocephalic and atraumatic.  Mouth/Throat: No oropharyngeal exudate.  Eyes: Conjunctivae and EOM are normal. Pupils are equal, round, and reactive to light.  Neck: Normal range of motion. Neck supple. No thyromegaly present.  Cardiovascular: Normal rate, regular rhythm and normal heart sounds.  Pulmonary/Chest: Effort normal. She has wheezes.  Abdominal: Soft.  Musculoskeletal: Normal range of motion.  Neurological: She is alert and oriented to person, place, and time. She has normal reflexes.  Skin: Skin is warm and dry.  Psychiatric: She has a normal mood and affect. Her behavior is normal. Judgment and thought content normal.   Assessment/Plan: 1. Wheezing - ipratropium-albuterol (DUONEB) 0.5-2.5 (3) MG/3ML nebulizer solution 3 mL - Spirometry with Graph  2. Cough - continue proventil.  3. Recurrent major depressive disorder, in full remission ([place]) - per psych, pt comes with  caregiver  General Counseling: Teresa Crawford understanding of the findings of todays visit and agrees with plan of treatment. I have discussed any further diagnostic evaluation that may be needed or ordered today. We also reviewed her medications today. she has been encouraged to call the office with any questions or concerns that should arise related to todays visit.    Orders Placed This Encounter  Procedures  . Spirometry with Graph    Meds ordered this encounter  Medications  . ipratropium-albuterol (DUONEB) 0.5-2.5 (3) MG/3ML nebulizer solution 3 mL  . albuterol (PROVENTIL HFA;VENTOLIN HFA) 108 (90 Base) MCG/ACT inhaler    Sig: Inhale 2 puffs into the lungs every 6 (six) hours as needed for wheezing or shortness of breath.    Dispense:  1 Inhaler    Refill:  0    Time spent:25 Minutes

## 2017-06-17 ENCOUNTER — Other Ambulatory Visit: Payer: Self-pay

## 2017-06-17 ENCOUNTER — Encounter: Payer: Self-pay | Admitting: Psychiatry

## 2017-06-17 ENCOUNTER — Ambulatory Visit (INDEPENDENT_AMBULATORY_CARE_PROVIDER_SITE_OTHER): Payer: Medicare Other | Admitting: Psychiatry

## 2017-06-17 VITALS — BP 132/87 | HR 95 | Temp 97.7°F | Wt 223.6 lb

## 2017-06-17 DIAGNOSIS — F4310 Post-traumatic stress disorder, unspecified: Secondary | ICD-10-CM | POA: Diagnosis not present

## 2017-06-17 DIAGNOSIS — F331 Major depressive disorder, recurrent, moderate: Secondary | ICD-10-CM

## 2017-06-17 DIAGNOSIS — F79 Unspecified intellectual disabilities: Secondary | ICD-10-CM | POA: Diagnosis not present

## 2017-06-17 MED ORDER — DULOXETINE HCL 30 MG PO CPEP: 30.0000 mg | ORAL_CAPSULE | Freq: Every day | ORAL | 2 refills | Status: DC

## 2017-06-17 MED ORDER — TRAZODONE HCL 50 MG PO TABS: 50.0000 mg | ORAL_TABLET | Freq: Every day | ORAL | 2 refills | Status: DC

## 2017-06-17 NOTE — Progress Notes (Signed)
Patient ID: Teresa Crawford, female   DOB: 02/25/1986, 32 y.o.   MRN: [id number]  [facility] MD/PA/NP OP Progress Note  06/17/2017 10:37 AM EBBA GOLL  MRN:  [id number]  Subjective:  Follow-up of her posttraumatic stress disorder and major depressive disorder, recurrent moderate. Reports doing well currently. Patient was seen with her group home worker. States that yesterday e was somewhat roughsince she drank coffee and also [facility]. States that that did not sit well with her. Was very anxious all day. Oves that this month is a bad month since it is the anniversary of her father's death. Otherwise doing well and enjoying being at [employer] and enjoys the programs.  Chief Complaint: Doing well Chief Complaint    Follow-up; Medication Refill; Weight Gain     Visit Diagnosis:     ICD-10-CM   1. Posttraumatic stress disorder F43.10   2. Major depressive disorder, recurrent episode, moderate (HCC) F33.1   3. Intellectual disability F93     Past Medical History:  Past Medical History:  Diagnosis Date  . B12 deficiency anemia   . Chronic back pain   . Chronic fatigue syndrome   . Constipation   . Depression   . Elevated liver enzymes   . Endometriosis   . Fibroids   . Hepatic steatosis   . Hypothyroid   . Last menstrual period (LMP) > 10 days ago 09/12/15  . Mental retardation   . Migraine   . Post traumatic stress disorder   . Posttraumatic stress disorder   . PUD (peptic ulcer disease)   . Scoliosis   . Splenomegaly   . Uterine fibroid     Past Surgical History:  Procedure Laterality Date  . ABDOMINAL HYSTERECTOMY    . BACK SURGERY    . COLONOSCOPY  04/27/2006   Wohl-colitis, internal hemorrhoids  . COLONOSCOPY  02/2013   focal right colitis, left colon biopsy negative, focal active proctitis without chronicity  . ESOPHAGOGASTRODUODENOSCOPY  02/2013   multiple peptic & duodenal ulcers, negative h pylori  . INTRAUTERINE DEVICE INSERTION  2012  . LAPAROSCOPIC  ENDOMETRIOSIS FULGURATION  2015   [name]  . OVARIAN CYST REMOVAL     Family History:  Family History  Problem Relation Age of Onset  . Breast cancer Mother 31  . COPD Father   . Lung cancer Father 59  . Dementia Father   . Diabetes Sister   . Diabetes Brother   . Ovarian cancer Paternal Aunt   . Colon cancer Maternal Grandfather 20  . Liver disease Maternal Aunt        ?etiology  . Lymphoma Maternal Grandmother 60  . Lung cancer Other 62       maternal great grandfather  . Stomach cancer Other 75       maternal great grandmother   Social History:  Social History   Socioeconomic History  . Marital status: Single    Spouse name: None  . Number of children: 0  . Years of education: None  . Highest education level: None  Social Needs  . Financial resource strain: Not hard at all  . Food insecurity - worry: Never true  . Food insecurity - inability: Never true  . Transportation needs - medical: No  . Transportation needs - non-medical: No  Occupational History  . None  Tobacco Use  . Smoking status: Never Smoker  . Smokeless tobacco: Never Used  Substance and Sexual Activity  . Alcohol use: Yes  Alcohol/week: 0.0 oz    Comment: occ  . Drug use: No  . Sexual activity: Not Currently  Other Topics Concern  . None  Social History Narrative   She has brother, half brother & sister live nearby   Both parents deceased   Care managed by [name]   Additional History:   Assessment:   Musculoskeletal: Strength & Muscle Tone: within normal limits Gait & Station: normal Patient leans: N/A  Psychiatric Specialty Exam: Medication Refill   Depression         Associated symptoms include does not have insomnia and no suicidal ideas.   Review of Systems  Endo/Heme/Allergies:       Patient reports that have an ultrasound of her thyroid.  Psychiatric/Behavioral: Negative for depression, hallucinations, memory loss, substance abuse and suicidal ideas. The patient  is not nervous/anxious and does not have insomnia.   All other systems reviewed and are negative.   Blood pressure 132/87, pulse 95, temperature 97.7 F (36.5 C), temperature source Oral, weight 101.4 kg (223 lb 9.6 oz), last menstrual period 01/05/2016.Body mass index is 37.21 kg/m.  General Appearance: Fairly Groomed  Eye Contact:  Fair  Speech:  Clear and Coherent and Normal Rate  Volume:  Normal  Mood:  Good  Affect:  Congruent   Thought Process:  Concrete  Orientation:  Full (Time, Place, and Person)  Thought Content:  Negative  Suicidal Thoughts:  No  Homicidal Thoughts:  No  Memory:  Immediate;   Good Recent;   Good Remote;   Good  Judgement:  Fair  Insight:  Fair  Psychomotor Activity:  Negative  Concentration:  Good  Recall:  Good  Fund of Knowledge: Fair  Language: Fair  Akathisia:  Negative  Handed:  Right unknown  AIMS (if indicated):  Not done  Assets:  Social Support  ADL's:  Intact  Cognition: Impaired,  Moderate  Sleep:  Good    Is the patient at risk to self?  No. Has the patient been a risk to self in the past 6 months?  No. Has the patient been a risk to self within the distant past?  No. Is the patient a risk to others?  No. Has the patient been a risk to others in the past 6 months?  No. Has the patient been a risk to others within the distant past?  No.  Current Medications: Current Outpatient Medications  Medication Sig Dispense Refill  . benzonatate (TESSALON) 200 MG capsule Take 200 mg by mouth every 8 (eight) hours as needed for cough.    . cetirizine (ZYRTEC) 10 MG tablet Take 1 tablet (10 mg total) by mouth daily. 30 tablet 3  . diazepam (VALIUM) 5 MG tablet INSERT 1 TABLET VAGINALLY ONCE DAILY AT BEDTIME 30 tablet 3  . diphenhydrAMINE (BENADRYL) 25 mg capsule Take 25 mg by mouth every 6 (six) hours as needed for allergies.    Marland Kitchen docusate sodium (COLACE) 100 MG capsule TAKE 1 CAPSULE BY MOUTH TWICE DAILY FOR STOOL SOFTENER 60 capsule 3  .  DULoxetine (CYMBALTA) 30 MG capsule Take 1 capsule (30 mg total) by mouth daily. 30 capsule 0  . EPINEPHrine 0.3 mg/0.3 mL IJ SOAJ injection USE FOR ANAPHYLAXIS 1 Device 5  . fluticasone (FLONASE) 50 MCG/ACT nasal spray Place 1 spray into both nostrils daily. 3 g 3  . norethindrone (AYGESTIN) 5 MG tablet TAKE 1 TABLET BY MOUTH ONCE DAILY 30 tablet 1  . omeprazole (PRILOSEC) 40 MG capsule TAKE 1  CAPSULE BY MOUTH TWICE DAILY 60 capsule 6  . polyethylene glycol (MIRALAX / GLYCOLAX) packet Take 17 g by mouth daily as needed. Take one cap of full in water qd prn only of constipation    . SUMAtriptan (IMITREX) 50 MG tablet Take 50 mg by mouth once. Take 1 tab at the onshet of headache with zofran    . topiramate (TOPAMAX) 50 MG tablet Take 25 mg by mouth at bedtime.     . traMADol (ULTRAM) 50 MG tablet Take 2 tablets (100 mg total) by mouth every 6 (six) hours as needed for moderate pain. 20 tablet 0  . traZODone (DESYREL) 50 MG tablet Take 1 tablet (50 mg total) by mouth at bedtime. 30 tablet 0   No current facility-administered medications for this visit.     Medical Decision Making:  Established Problem, Stable/Improving (1)  Treatment Plan Summary:Medication management   Post traumatic stress disorder- Continue Cymbalta to 30 mg daily   Major depressive disorder, recurrent, moderate- Same as above  Insomnia- Continue to take Trazodone at 50mg  po qhs.  Patient is also on Valium prescribed by another doctor for pelvic muscle spasms.  Patient will return in  3 months. She has  been encouraged call with any questions or concerns prior to her next appointment.  Abbagayle Zaragoza 06/17/2017, 10:37 AM

## 2017-07-06 ENCOUNTER — Other Ambulatory Visit: Payer: Self-pay | Admitting: Internal Medicine

## 2017-08-03 DIAGNOSIS — G4489 Other headache syndrome: Secondary | ICD-10-CM | POA: Insufficient documentation

## 2017-08-03 DIAGNOSIS — R569 Unspecified convulsions: Secondary | ICD-10-CM | POA: Diagnosis not present

## 2017-08-03 DIAGNOSIS — R51 Headache: Secondary | ICD-10-CM | POA: Diagnosis not present

## 2017-08-05 ENCOUNTER — Other Ambulatory Visit: Payer: Self-pay | Admitting: Internal Medicine

## 2017-08-17 ENCOUNTER — Encounter: Payer: Self-pay | Admitting: Internal Medicine

## 2017-08-17 ENCOUNTER — Ambulatory Visit (INDEPENDENT_AMBULATORY_CARE_PROVIDER_SITE_OTHER): Payer: Medicare Other | Admitting: Internal Medicine

## 2017-08-17 VITALS — BP 118/77 | HR 104 | Resp 16 | Ht 65.0 in | Wt 221.4 lb

## 2017-08-17 DIAGNOSIS — G5601 Carpal tunnel syndrome, right upper limb: Secondary | ICD-10-CM | POA: Diagnosis not present

## 2017-08-17 DIAGNOSIS — R5383 Other fatigue: Secondary | ICD-10-CM | POA: Diagnosis not present

## 2017-08-17 DIAGNOSIS — R110 Nausea: Secondary | ICD-10-CM

## 2017-08-17 NOTE — Progress Notes (Signed)
[facility] [place] [place], [place] [address]  Internal MEDICINE  Office Visit Note  Patient Name: Teresa Crawford  [id number]  [id number]  Date of Service: 08/19/2017  Chief Complaint  Patient presents with  . Fatigue    follow up   HPI  Pt is here for routine follow up.She has been drowsy and sluggish in the morning, takes Diazepam and trazodone at night. She is also gaining weight after being on Cymbalta and ocp, she will like to go off of them, feels sick on her stomach too  C/o numbness in right arm, has been on her phone all day. Sometimes feels weak   Current Medication: Outpatient Encounter Medications as of 08/17/2017  Medication Sig  . cetirizine (ZYRTEC) 10 MG tablet TAKE 1 TABLET BY MOUTH AT BEDTIME FOR ALLERGIES  . diazepam (VALIUM) 5 MG tablet INSERT 1 TABLET VAGINALLY ONCE DAILY AT BEDTIME  . diphenhydrAMINE (BENADRYL) 25 mg capsule Take 25 mg by mouth every 6 (six) hours as needed for allergies.  Marland Kitchen docusate sodium (COLACE) 100 MG capsule TAKE 1 CAPSULE BY MOUTH TWICE DAILY FOR STOOL SOFTENER  . DULoxetine (CYMBALTA) 30 MG capsule Take 1 capsule (30 mg total) by mouth daily.  Marland Kitchen EPINEPHrine 0.3 mg/0.3 mL IJ SOAJ injection USE FOR ANAPHYLAXIS  . fluticasone (FLOVENT HFA) 220 MCG/ACT inhaler Inhale into the lungs.  . norethindrone (AYGESTIN) 5 MG tablet TAKE 1 TABLET BY MOUTH ONCE DAILY  . omeprazole (PRILOSEC) 40 MG capsule TAKE 1 CAPSULE BY MOUTH TWICE DAILY  . SUMAtriptan (IMITREX) 50 MG tablet Take 50 mg by mouth once. Take 1 tab at the onshet of headache with zofran  . topiramate (TOPAMAX) 50 MG tablet TAKE 1 TABLET BY MOUTH EACH EVENING  . traMADol (ULTRAM) 50 MG tablet Take 2 tablets (100 mg total) by mouth every 6 (six) hours as needed for moderate pain.  . traZODone (DESYREL) 50 MG tablet Take 1 tablet (50 mg total) by mouth at bedtime.  . benzonatate (TESSALON) 200 MG capsule Take 200 mg by mouth every 8 (eight) hours as needed for cough.   . fluticasone (FLONASE) 50 MCG/ACT nasal spray Place 1 spray into both nostrils daily.  . [DISCONTINUED] polyethylene glycol (MIRALAX / GLYCOLAX) packet Take 17 g by mouth daily as needed. Take one cap of full in water qd prn only of constipation   No facility-administered encounter medications on file as of 08/17/2017.     Surgical History: Past Surgical History:  Procedure Laterality Date  . ABDOMINAL HYSTERECTOMY    . BACK SURGERY    . COLONOSCOPY  04/27/2006   Wohl-colitis, internal hemorrhoids  . COLONOSCOPY  02/2013   focal right colitis, left colon biopsy negative, focal active proctitis without chronicity  . ESOPHAGOGASTRODUODENOSCOPY  02/2013   multiple peptic & duodenal ulcers, negative h pylori  . INTRAUTERINE DEVICE INSERTION  2012  . LAPAROSCOPIC ENDOMETRIOSIS FULGURATION  2015   [name]  . OVARIAN CYST REMOVAL      Medical History: Past Medical History:  Diagnosis Date  . B12 deficiency anemia   . Chronic back pain   . Chronic fatigue syndrome   . Constipation   . Depression   . Elevated liver enzymes   . Endometriosis   . Fibroids   . Hepatic steatosis   . Hypothyroid   . Last menstrual period (LMP) > 10 days ago 09/12/15  . Mental retardation   . Migraine   . Post traumatic stress disorder   . Posttraumatic  stress disorder   . PUD (peptic ulcer disease)   . Scoliosis   . Splenomegaly   . Uterine fibroid     Family History: Family History  Problem Relation Age of Onset  . Breast cancer Mother 22  . COPD Father   . Lung cancer Father 41  . Dementia Father   . Diabetes Sister   . Diabetes Brother   . Ovarian cancer Paternal Aunt   . Colon cancer Maternal Grandfather 33  . Liver disease Maternal Aunt        ?etiology  . Lymphoma Maternal Grandmother 60  . Lung cancer Other 31       maternal great grandfather  . Stomach cancer Other 33       maternal great grandmother    Social History   Socioeconomic History  . Marital status: Single     Spouse name: Not on file  . Number of children: 0  . Years of education: Not on file  . Highest education level: Not on file  Occupational History  . Not on file  Social Needs  . Financial resource strain: Not hard at all  . Food insecurity:    Worry: Never true    Inability: Never true  . Transportation needs:    Medical: No    Non-medical: No  Tobacco Use  . Smoking status: Never Smoker  . Smokeless tobacco: Never Used  Substance and Sexual Activity  . Alcohol use: Yes    Alcohol/week: 0.0 oz    Comment: occ  . Drug use: No  . Sexual activity: Not Currently  Lifestyle  . Physical activity:    Days per week: 0 days    Minutes per session: 0 min  . Stress: Not at all  Relationships  . Social connections:    Talks on phone: Patient refused    Gets together: Patient refused    Attends religious service: Patient refused    Active member of club or organization: Patient refused    Attends meetings of clubs or organizations: Patient refused    Relationship status: Patient refused  . Intimate partner violence:    Fear of current or ex partner: Patient refused    Emotionally abused: Patient refused    Physically abused: Patient refused    Forced sexual activity: Patient refused  Other Topics Concern  . Not on file  Social History Narrative   She has brother, half brother & sister live nearby   Both parents deceased   Care managed by [name]    Review of Systems  Constitutional: Negative for chills, diaphoresis and fatigue.  HENT: Negative for ear pain, postnasal drip and sinus pressure.   Eyes: Negative for photophobia, discharge, redness, itching and visual disturbance.  Respiratory: Negative for cough, shortness of breath and wheezing.   Cardiovascular: Negative for chest pain, palpitations and leg swelling.  Gastrointestinal: Negative for abdominal pain, constipation, diarrhea, nausea and vomiting.  Genitourinary: Negative for dysuria and flank pain.   Musculoskeletal: Negative for arthralgias, back pain, gait problem and neck pain.  Skin: Negative for color change.  Allergic/Immunologic: Negative for environmental allergies and food allergies.  Neurological: Negative for dizziness and headaches.  Hematological: Does not bruise/bleed easily.  Psychiatric/Behavioral: Negative for agitation, behavioral problems (depression) and hallucinations.    Vital Signs: BP 118/77 (BP Location: Right Arm, Patient Position: Sitting, Cuff Size: Normal)   Pulse (!) 104   Resp 16   Ht 5\' 5"  (1.651 m)   Wt 221 lb  6.4 oz (100.4 kg)   LMP 01/05/2016   SpO2 100%   BMI 36.84 kg/m    Physical Exam  Constitutional: She is oriented to person, place, and time. She appears well-developed and well-nourished. No distress.  HENT:  Head: Normocephalic and atraumatic.  Mouth/Throat: Oropharynx is clear and moist. No oropharyngeal exudate.  Eyes: Pupils are equal, round, and reactive to light. EOM are normal.  Neck: Normal range of motion. Neck supple. No JVD present. No tracheal deviation present. No thyromegaly present.  Cardiovascular: Normal rate, regular rhythm and normal heart sounds. Exam reveals no gallop and no friction rub.  No murmur heard. Pulmonary/Chest: Effort normal. No respiratory distress. She has no wheezes. She has no rales. She exhibits no tenderness.  Abdominal: Soft. Bowel sounds are normal.  Musculoskeletal: Normal range of motion.  Lymphadenopathy:    She has no cervical adenopathy.  Neurological: She is alert and oriented to person, place, and time. No cranial nerve deficit.  Skin: Skin is warm and dry. She is not diaphoretic.  Psychiatric: She has a normal mood and affect. Her behavior is normal. Judgment and thought content normal.   Assessment/Plan: 1. Carpal tunnel syndrome on right - Motrin otc 200 mg 2 tabs po bid prn, wrist splint. Lose weight  2. Nausea - Can be multifactorial, will dc meds , like OCP  3. Fatigue,  unspecified type - DC Trazadon and Cymbalta for now  General Counseling: Aveline verbalizes understanding of the findings of todays visit and agrees with plan of treatment. I have discussed any further diagnostic evaluation that may be needed or ordered today. We also reviewed her medications today. she has been encouraged to call the office with any questions or concerns that should arise related to todays visit.  Time spent: 11 Minutes  Dr Lavera Guise Internal medicine

## 2017-08-22 ENCOUNTER — Other Ambulatory Visit: Payer: Self-pay

## 2017-08-22 ENCOUNTER — Encounter: Payer: Self-pay | Admitting: Emergency Medicine

## 2017-08-22 ENCOUNTER — Emergency Department
Admission: EM | Admit: 2017-08-22 | Discharge: 2017-08-22 | Disposition: A | Discharge status: Home / Self Care | Payer: Medicare Other | Attending: Emergency Medicine | Admitting: Emergency Medicine

## 2017-08-22 DIAGNOSIS — R51 Headache: Secondary | ICD-10-CM | POA: Insufficient documentation

## 2017-08-22 DIAGNOSIS — R42 Dizziness and giddiness: Secondary | ICD-10-CM | POA: Diagnosis not present

## 2017-08-22 DIAGNOSIS — E039 Hypothyroidism, unspecified: Secondary | ICD-10-CM | POA: Diagnosis not present

## 2017-08-22 DIAGNOSIS — A084 Viral intestinal infection, unspecified: Secondary | ICD-10-CM | POA: Insufficient documentation

## 2017-08-22 DIAGNOSIS — N3000 Acute cystitis without hematuria: Secondary | ICD-10-CM | POA: Diagnosis not present

## 2017-08-22 DIAGNOSIS — Z79899 Other long term (current) drug therapy: Secondary | ICD-10-CM | POA: Diagnosis not present

## 2017-08-22 DIAGNOSIS — R519 Headache, unspecified: Secondary | ICD-10-CM

## 2017-08-22 LAB — URINALYSIS, COMPLETE (UACMP) WITH MICROSCOPIC
BILIRUBIN URINE: NEGATIVE
GLUCOSE, UA: NEGATIVE mg/dL
Hgb urine dipstick: NEGATIVE
KETONES UR: NEGATIVE mg/dL
Nitrite: NEGATIVE
PROTEIN: 30 mg/dL — AB
Specific Gravity, Urine: 1.018 (ref 1.005–1.030)
pH: 8 (ref 5.0–8.0)

## 2017-08-22 LAB — CBC WITH DIFFERENTIAL/PLATELET
BASOS PCT: 1 %
Basophils Absolute: 0 10*3/uL (ref 0–0.1)
EOS ABS: 0.1 10*3/uL (ref 0–0.7)
Eosinophils Relative: 1 %
HCT: 37.3 % (ref 35.0–47.0)
Hemoglobin: 12.1 g/dL (ref 12.0–16.0)
LYMPHS ABS: 1.9 10*3/uL (ref 1.0–3.6)
Lymphocytes Relative: 35 %
MCH: 24 pg — ABNORMAL LOW (ref 26.0–34.0)
MCHC: 32.4 g/dL (ref 32.0–36.0)
MCV: 74 fL — ABNORMAL LOW (ref 80.0–100.0)
MONOS PCT: 7 %
Monocytes Absolute: 0.4 10*3/uL (ref 0.2–0.9)
NEUTROS ABS: 3.1 10*3/uL (ref 1.4–6.5)
NEUTROS PCT: 56 %
PLATELETS: 257 10*3/uL (ref 150–440)
RBC: 5.05 MIL/uL (ref 3.80–5.20)
RDW: 17.2 % — AB (ref 11.5–14.5)
WBC: 5.5 10*3/uL (ref 3.6–11.0)

## 2017-08-22 LAB — BASIC METABOLIC PANEL
ANION GAP: 8 (ref 5–15)
BUN: 6 mg/dL (ref 6–20)
CALCIUM: 8.7 mg/dL — AB (ref 8.9–10.3)
CO2: 22 mmol/L (ref 22–32)
Chloride: 109 mmol/L (ref 101–111)
Creatinine, Ser: 0.81 mg/dL (ref 0.44–1.00)
GLUCOSE: 96 mg/dL (ref 65–99)
Potassium: 4 mmol/L (ref 3.5–5.1)
SODIUM: 139 mmol/L (ref 135–145)

## 2017-08-22 MED ORDER — NITROFURANTOIN MONOHYD MACRO 100 MG PO CAPS
100.0000 mg | ORAL_CAPSULE | Freq: Once | ORAL | Status: AC
  Administered 2017-08-22: 100 mg via ORAL
  Filled 2017-08-22: qty 1

## 2017-08-22 MED ORDER — NITROFURANTOIN MONOHYD MACRO 100 MG PO CAPS: 100.0000 mg | ORAL_CAPSULE | Freq: Two times a day (BID) | ORAL | 0 refills | Status: AC

## 2017-08-22 MED ORDER — KETOROLAC TROMETHAMINE 30 MG/ML IJ SOLN
15.0000 mg | Freq: Once | INTRAMUSCULAR | Status: AC
  Administered 2017-08-22: 15 mg via INTRAVENOUS
  Filled 2017-08-22: qty 1

## 2017-08-22 MED ORDER — ONDANSETRON 4 MG PO TBDP: 4.0000 mg | ORAL_TABLET | Freq: Three times a day (TID) | ORAL | 0 refills | Status: DC | PRN

## 2017-08-22 MED ORDER — METOCLOPRAMIDE HCL 5 MG/ML IJ SOLN
10.0000 mg | Freq: Once | INTRAMUSCULAR | Status: AC
  Administered 2017-08-22: 10 mg via INTRAVENOUS
  Filled 2017-08-22: qty 2

## 2017-08-22 MED ORDER — SODIUM CHLORIDE 0.9 % IV BOLUS
1000.0000 mL | Freq: Once | INTRAVENOUS | Status: AC
  Administered 2017-08-22: 1000 mL via INTRAVENOUS

## 2017-08-22 NOTE — ED Provider Notes (Signed)
[facility] Provider Note  ____________________________________________  Time seen: Approximately 8:54 PM  I have reviewed the triage vital signs and the nursing notes.   HISTORY  Chief Complaint Migraine   HPI Teresa Crawford is a 32 y.o. female with a history of chronic fatigue, anemia, depression, mental retardation, PTSD who presents for evaluation of dizziness.  Patient reports 3 days of several daily episodes of nonbloody nonbilious emesis and watery diarrhea.  She reports that she has been having a constant generalized sharp severe headache for 3 days associated with photophobia and dizziness.  She denies thunderclap HA. She describes her dizziness as lightheadedness but also room spinning.  She denies any prior history of vertigo.  She denies fever or chills.  She denies abdominal pain, chest pain, shortness of breath.  She is also complaining of bilateral ear pain.  No sore throat.  No dysuria hematuria.  She is status post hysterectomy.   Past Medical History:  Diagnosis Date  . B12 deficiency anemia   . Chronic back pain   . Chronic fatigue syndrome   . Constipation   . Depression   . Elevated liver enzymes   . Endometriosis   . Fibroids   . Hepatic steatosis   . Hypothyroid   . Last menstrual period (LMP) > 10 days ago 09/12/15  . Mental retardation   . Migraine   . Post traumatic stress disorder   . Posttraumatic stress disorder   . PUD (peptic ulcer disease)   . Scoliosis   . Splenomegaly   . Uterine fibroid     Patient Active Problem List   Diagnosis Date Noted  . Constipation, unspecified 04/16/2017  . Nontoxic single thyroid nodule 04/16/2017  . Migraine without aura, not intractable, without status migrainosus 04/16/2017  . Other obesity due to excess calories 04/16/2017  . Seizures ([place]) 03/31/2017  . Seizure-like activity ([place]) 02/02/2017  . Pressure injury of skin 12/30/2016  . Scoliosis 12/28/2016    . Hypothyroidism 12/28/2016  . Lower extremity weakness 12/28/2016  . Self-inflicted laceration of wrist 04/07/2016  . Major depressive disorder, recurrent episode, moderate ([place]) 10/29/2014  . PTSD (post-traumatic stress disorder) 10/29/2014  . Grief 10/29/2014  . CFIDS (chronic fatigue and immune dysfunction syndrome) ([place]) 09/28/2014  . Aggrieved 09/28/2014  . Depression, major, recurrent ([place]) 09/28/2014  . Intellectual disability 09/28/2014  . Neurosis, posttraumatic 09/28/2014  . History of colitis 09/22/2014  . Hepatic steatosis 09/20/2014  . Endometriosis 08/30/2013    Past Surgical History:  Procedure Laterality Date  . ABDOMINAL HYSTERECTOMY    . BACK SURGERY    . COLONOSCOPY  04/27/2006   Wohl-colitis, internal hemorrhoids  . COLONOSCOPY  02/2013   focal right colitis, left colon biopsy negative, focal active proctitis without chronicity  . ESOPHAGOGASTRODUODENOSCOPY  02/2013   multiple peptic & duodenal ulcers, negative h pylori  . INTRAUTERINE DEVICE INSERTION  2012  . LAPAROSCOPIC ENDOMETRIOSIS FULGURATION  2015   [name]  . OVARIAN CYST REMOVAL      Prior to Admission medications   Medication Sig Start Date End Date Taking? Authorizing Provider  benzonatate (TESSALON) 200 MG capsule Take 200 mg by mouth every 8 (eight) hours as needed for cough.    [provider]  cetirizine (ZYRTEC) 10 MG tablet TAKE 1 TABLET BY MOUTH AT BEDTIME FOR ALLERGIES 08/05/17   Lavera Guise, MD  diazepam (VALIUM) 5 MG tablet INSERT 1 TABLET VAGINALLY ONCE DAILY AT BEDTIME 05/10/17   Humphrey Rolls,  Timoteo Gaul, MD  diphenhydrAMINE (BENADRYL) 25 mg capsule Take 25 mg by mouth every 6 (six) hours as needed for allergies.    [provider]  docusate sodium (COLACE) 100 MG capsule TAKE 1 CAPSULE BY MOUTH TWICE DAILY FOR STOOL SOFTENER 08/05/17   Lavera Guise, MD  DULoxetine (CYMBALTA) 30 MG capsule Take 1 capsule (30 mg total) by mouth daily. 06/17/17 06/17/18  Elvin So, MD   EPINEPHrine 0.3 mg/0.3 mL IJ SOAJ injection USE FOR ANAPHYLAXIS 06/14/17   Lavera Guise, MD  fluticasone [facility]) 50 MCG/ACT nasal spray Place 1 spray into both nostrils daily. 06/04/17   Lavera Guise, MD  fluticasone (FLOVENT HFA) 220 MCG/ACT inhaler Inhale into the lungs.    [provider]  nitrofurantoin, macrocrystal-monohydrate, (MACROBID) 100 MG capsule Take 1 capsule (100 mg total) by mouth 2 (two) times daily for 5 days. 08/22/17 08/27/17  Rudene Re, MD  norethindrone (AYGESTIN) 5 MG tablet TAKE 1 TABLET BY MOUTH ONCE DAILY 08/05/17   Lavera Guise, MD  omeprazole (PRILOSEC) 40 MG capsule TAKE 1 CAPSULE BY MOUTH TWICE DAILY 05/10/17   Lavera Guise, MD  ondansetron [facility] ODT) 4 MG disintegrating tablet Take 1 tablet (4 mg total) by mouth every 8 (eight) hours as needed for nausea or vomiting. 08/22/17   Alfred Levins, [place], MD  SUMAtriptan (IMITREX) 50 MG tablet Take 50 mg by mouth once. Take 1 tab at the onshet of headache with zofran    [provider]  topiramate (TOPAMAX) 50 MG tablet TAKE 1 TABLET BY MOUTH EACH EVENING 07/06/17   Ronnell Freshwater, NP  traMADol (ULTRAM) 50 MG tablet Take 2 tablets (100 mg total) by mouth every 6 (six) hours as needed for moderate pain. 12/31/16   Hillary Bow, MD  traZODone (DESYREL) 50 MG tablet Take 1 tablet (50 mg total) by mouth at bedtime. 06/17/17   Elvin So, MD    Allergies Peanut butter flavor; Codeine; Paroxetine; Paxil [paroxetine hcl]; Venlafaxine; and Zoloft [sertraline hcl]  Family History  Problem Relation Age of Onset  . Breast cancer Mother 45  . COPD Father   . Lung cancer Father 36  . Dementia Father   . Diabetes Sister   . Diabetes Brother   . Ovarian cancer Paternal Aunt   . Colon cancer Maternal Grandfather 73  . Liver disease Maternal Aunt        ?etiology  . Lymphoma Maternal Grandmother 60  . Lung cancer Other 66       maternal great grandfather  . Stomach cancer Other 31       maternal  great grandmother    Social History Social History   Tobacco Use  . Smoking status: Never Smoker  . Smokeless tobacco: Never Used  Substance Use Topics  . Alcohol use: Yes    Alcohol/week: 0.0 oz    Comment: Occassional  . Drug use: No    Review of Systems  Constitutional: Negative for fever. + Dizziness Eyes: Negative for visual changes. ENT: Negative for sore throat. + ear pain Neck: No neck pain  Cardiovascular: Negative for chest pain. Respiratory: Negative for shortness of breath. Gastrointestinal: Negative for abdominal pain. + vomiting and diarrhea. Genitourinary: Negative for dysuria. Musculoskeletal: Negative for back pain. Skin: Negative for rash. Neurological: + headaches. No weakness or numbness. Psych: No SI or HI  ____________________________________________   PHYSICAL EXAM:  VITAL SIGNS: ED Triage Vitals  Enc Vitals Group     BP 08/22/17 1953 Marland Kitchen)  135/58     Pulse Rate 08/22/17 1953 94     Resp 08/22/17 1953 16     Temp 08/22/17 1953 98.3 F (36.8 C)     Temp Source 08/22/17 1953 Oral     SpO2 08/22/17 1953 100 %     Weight 08/22/17 1954 212 lb (96.2 kg)     Height 08/22/17 1954 5\' 2"  (1.575 m)     Head Circumference --      Peak Flow --      Pain Score 08/22/17 1954 9     Pain Loc --      Pain Edu? --      Excl. in GC? --    Orthostatic VS: negative  Constitutional: Alert and oriented. Well appearing and in no apparent distress. HEENT:      Head: Normocephalic and atraumatic.         Eyes: Conjunctivae are normal. Sclera is non-icteric.       Ears: TMs are visualized and clear with no evidence of outer or inner ear infection      Mouth/Throat: Mucous membranes are moist.       Neck: Supple with no signs of meningismus. Cardiovascular: Regular rate and rhythm. No murmurs, gallops, or rubs. 2+ symmetrical distal pulses are present in all extremities. No JVD. Respiratory: Normal respiratory effort. Lungs are clear to auscultation  bilaterally. No wheezes, crackles, or rhonchi.  Gastrointestinal: Soft, non tender, and non distended with positive bowel sounds. No rebound or guarding. Musculoskeletal: Nontender with normal range of motion in all extremities. No edema, cyanosis, or erythema of extremities. Neurologic: Normal speech and language. A & O x3, PERRL, EOMI, no nystagmus, CN II-XII intact, motor testing reveals good tone and bulk throughout. There is no evidence of pronator drift or dysmetria. Muscle strength is 5/5 throughout. Sensory examination is intact. Gait is normal. Skin: Skin is warm, dry and intact. No rash noted. Psychiatric: Mood and affect are normal. Speech and behavior are normal.  ____________________________________________   LABS (all labs ordered are listed, but only abnormal results are displayed)  Labs Reviewed  CBC WITH DIFFERENTIAL/PLATELET - Abnormal; Notable for the following components:      Result Value   MCV 74.0 (*)    MCH 24.0 (*)    RDW 17.2 (*)    All other components within normal limits  BASIC METABOLIC PANEL - Abnormal; Notable for the following components:   Calcium 8.7 (*)    All other components within normal limits  URINALYSIS, COMPLETE (UACMP) WITH MICROSCOPIC - Abnormal; Notable for the following components:   Color, Urine YELLOW (*)    APPearance CLOUDY (*)    Protein, ur 30 (*)    Leukocytes, UA TRACE (*)    Bacteria, UA MANY (*)    All other components within normal limits  URINE CULTURE   ____________________________________________  EKG  ED ECG REPORT I, Rudene Re, the attending physician, personally viewed and interpreted this ECG.  Normal sinus rhythm, rate of 83, normal intervals, normal axis, no ST elevations or depressions.  Unchanged from prior ____________________________________________  RADIOLOGY  I have personally reviewed the images performed during this visit and I agree with the Radiologist's read.   Interpretation by  Radiologist:  No results found.    ____________________________________________   PROCEDURES  Procedure(s) performed: None Procedures Critical Care performed:  None ____________________________________________   INITIAL IMPRESSION / ASSESSMENT AND PLAN / ED COURSE  32 y.o. female with a history of chronic fatigue, anemia, depression,  mental retardation, PTSD who presents for evaluation of dizziness in the setting of 3 days of multiple daily episodes of vomiting, diarrhea, and also associated headache.  Patient is extremely well-appearing, no distress, she has normal vital signs, she is neurologically intact, physical exam shows no acute findings.  Presentation is most likely concerning for gastroenteritis causing mild dehydration.  Patient has no thunderclap headache with no suspicion of subarachnoid hemorrhage.  There is no fever or neck stiffness with no clinical evidence of meningitis.  Will check labs to rule out dehydration or acute kidney injury.  Will give IV fluids, Reglan, Toradol.    _________________________ 10:59 PM on 08/22/2017 -----------------------------------------  Labs showing no acute findings.  UA positive for UTI.  Patient was started on Macrobid.  After getting fluids and a migraine cocktail patient reports that she feels markedly improved.  Patient is going to be discharged home on Macrobid and Zofran.  Discussed return precautions and close follow-up with primary care doctor.   As part of my medical decision making, I reviewed the following data within the [place] notes reviewed and incorporated, Labs reviewed , Old chart reviewed, Notes from prior ED visits and [place] Controlled Substance Database    Pertinent labs & imaging results that were available during my care of the patient were reviewed by me and considered in my medical decision making (see chart for details).    ____________________________________________   FINAL  CLINICAL IMPRESSION(S) / ED DIAGNOSES  Final diagnoses:  Viral gastroenteritis  Acute nonintractable headache, unspecified headache type  Acute cystitis without hematuria      NEW MEDICATIONS STARTED DURING THIS VISIT:  ED Discharge Orders        Ordered    ondansetron (ZOFRAN ODT) 4 MG disintegrating tablet  Every 8 hours PRN     08/22/17 2258    nitrofurantoin, macrocrystal-monohydrate, (MACROBID) 100 MG capsule  2 times daily     08/22/17 2258       Note:  This document was prepared using Dragon voice recognition software and may include unintentional dictation errors.    Rudene Re, MD 08/22/17 2330

## 2017-08-22 NOTE — ED Triage Notes (Signed)
Pt in via POV with complaints of migraine w/ associated dizziness since Thursday, denies any photosensitivity.  Pt A/Ox4, NAD noted at this time.

## 2017-08-24 LAB — URINE CULTURE

## 2017-09-06 ENCOUNTER — Other Ambulatory Visit: Payer: Self-pay | Admitting: Internal Medicine

## 2017-09-06 NOTE — Telephone Encounter (Signed)
Pharmacy requesting refill on diazepam.  dbs

## 2017-09-14 ENCOUNTER — Ambulatory Visit: Payer: Medicare Other | Admitting: Psychiatry

## 2017-09-21 ENCOUNTER — Ambulatory Visit: Payer: Medicare Other | Admitting: Psychiatry

## 2017-09-29 ENCOUNTER — Ambulatory Visit: Payer: Self-pay | Admitting: Internal Medicine

## 2017-10-13 ENCOUNTER — Encounter (INDEPENDENT_AMBULATORY_CARE_PROVIDER_SITE_OTHER): Payer: Self-pay

## 2017-10-13 ENCOUNTER — Encounter: Payer: Self-pay | Admitting: Internal Medicine

## 2017-10-13 ENCOUNTER — Ambulatory Visit (INDEPENDENT_AMBULATORY_CARE_PROVIDER_SITE_OTHER): Payer: Medicare Other | Admitting: Internal Medicine

## 2017-10-13 VITALS — BP 113/71 | HR 75 | Resp 16 | Ht 67.0 in | Wt 217.0 lb

## 2017-10-13 DIAGNOSIS — Z0001 Encounter for general adult medical examination with abnormal findings: Secondary | ICD-10-CM

## 2017-10-13 DIAGNOSIS — G43009 Migraine without aura, not intractable, without status migrainosus: Secondary | ICD-10-CM

## 2017-10-13 DIAGNOSIS — J3089 Other allergic rhinitis: Secondary | ICD-10-CM | POA: Diagnosis not present

## 2017-10-13 DIAGNOSIS — F3342 Major depressive disorder, recurrent, in full remission: Secondary | ICD-10-CM

## 2017-10-13 NOTE — Progress Notes (Signed)
[facility] [place], [place] [address]  Internal MEDICINE  Office Visit Note  Patient Name: Teresa Crawford  [id number]  [id number]  Date of Service: 10/25/2017  Chief Complaint  Patient presents with  . Annual Exam    no pap  . Asthma  . Depression  Pt is here for routine health maintenance examination  Asthma  She complains of cough, frequent throat clearing, hoarse voice and wheezing. There is no shortness of breath. This is a recurrent problem. The cough is paroxysmal. Pertinent negatives include no chest pain, ear pain, headaches or postnasal drip. Her symptoms are aggravated by pollen. Her past medical history is significant for asthma.  Depression         This is a chronic problem.  The onset quality is gradual.   The problem occurs intermittently.  The problem has been waxing and waning since onset.  Associated symptoms include no fatigue and no headaches.  Past treatments include SSRIs - Selective serotonin reuptake inhibitors.  Compliance with treatment is good.  Risk factors include family history, family history of mental illness and history of self-injury.   Current Medication: Outpatient Encounter Medications as of 10/13/2017  Medication Sig  . albuterol (PROAIR HFA) 108 (90 Base) MCG/ACT inhaler Inhale 2 puffs into the lungs every 6 (six) hours as needed for wheezing or shortness of breath.  . cetirizine (ZYRTEC) 10 MG tablet TAKE 1 TABLET BY MOUTH AT BEDTIME FOR ALLERGIES  . diazepam (VALIUM) 5 MG tablet INSERT 1 TABLET VAGINALLY ONCE DAILY AT BEDTIME  . docusate sodium (COLACE) 100 MG capsule TAKE 1 CAPSULE BY MOUTH TWICE DAILY FOR STOOL SOFTENER  . EPINEPHrine 0.3 mg/0.3 mL IJ SOAJ injection USE FOR ANAPHYLAXIS  . fluticasone (FLONASE) 50 MCG/ACT nasal spray Place 1 spray into both nostrils daily.  . fluticasone (FLOVENT HFA) 220 MCG/ACT inhaler Inhale into the lungs.  Marland Kitchen omeprazole (PRILOSEC) 40 MG capsule TAKE 1 CAPSULE BY MOUTH TWICE  DAILY  . ondansetron (ZOFRAN ODT) 4 MG disintegrating tablet Take 1 tablet (4 mg total) by mouth every 8 (eight) hours as needed for nausea or vomiting.  . SUMAtriptan (IMITREX) 50 MG tablet Take 50 mg by mouth once. Take 1 tab at the onshet of headache with zofran  . topiramate (TOPAMAX) 50 MG tablet TAKE 1 TABLET BY MOUTH EACH EVENING  . traMADol (ULTRAM) 50 MG tablet Take 2 tablets (100 mg total) by mouth every 6 (six) hours as needed for moderate pain.  . [DISCONTINUED] benzonatate (TESSALON) 200 MG capsule Take 200 mg by mouth every 8 (eight) hours as needed for cough.  . [DISCONTINUED] diphenhydrAMINE (BENADRYL) 25 mg capsule Take 25 mg by mouth every 6 (six) hours as needed for allergies.  . [DISCONTINUED] DULoxetine (CYMBALTA) 30 MG capsule Take 1 capsule (30 mg total) by mouth daily. (Patient not taking: Reported on 10/13/2017)  . [DISCONTINUED] norethindrone (AYGESTIN) 5 MG tablet TAKE 1 TABLET BY MOUTH ONCE DAILY (Patient not taking: Reported on 10/13/2017)  . [DISCONTINUED] traZODone (DESYREL) 50 MG tablet Take 1 tablet (50 mg total) by mouth at bedtime. (Patient not taking: Reported on 10/13/2017)   No facility-administered encounter medications on file as of 10/13/2017.     Surgical History: Past Surgical History:  Procedure Laterality Date  . ABDOMINAL HYSTERECTOMY    . BACK SURGERY    . COLONOSCOPY  04/27/2006   Wohl-colitis, internal hemorrhoids  . COLONOSCOPY  02/2013   focal right colitis, left colon biopsy negative, focal active proctitis  without chronicity  . ESOPHAGOGASTRODUODENOSCOPY  02/2013   multiple peptic & duodenal ulcers, negative h pylori  . INTRAUTERINE DEVICE INSERTION  2012  . LAPAROSCOPIC ENDOMETRIOSIS FULGURATION  2015   [name]  . OVARIAN CYST REMOVAL      Medical History: Past Medical History:  Diagnosis Date  . B12 deficiency anemia   . Chronic back pain   . Chronic fatigue syndrome   . Constipation   . Depression   . Elevated liver enzymes   .  Endometriosis   . Fibroids   . Hepatic steatosis   . Hypothyroid   . Last menstrual period (LMP) > 10 days ago 09/12/15  . Mental retardation   . Migraine   . Post traumatic stress disorder   . Posttraumatic stress disorder   . PUD (peptic ulcer disease)   . Scoliosis   . Splenomegaly   . Uterine fibroid     Family History: Family History  Problem Relation Age of Onset  . Breast cancer Mother 59  . COPD Father   . Lung cancer Father 31  . Dementia Father   . Diabetes Sister   . Diabetes Brother   . Ovarian cancer Paternal Aunt   . Colon cancer Maternal Grandfather 21  . Liver disease Maternal Aunt        ?etiology  . Lymphoma Maternal Grandmother 60  . Lung cancer Other 48       maternal great grandfather  . Stomach cancer Other 40       maternal great grandmother    Review of Systems  Constitutional: Negative for chills, diaphoresis and fatigue.  HENT: Positive for hoarse voice. Negative for ear pain, postnasal drip and sinus pressure.   Eyes: Negative for photophobia, discharge, redness, itching and visual disturbance.  Respiratory: Positive for cough and wheezing. Negative for shortness of breath.   Cardiovascular: Negative for chest pain, palpitations and leg swelling.  Gastrointestinal: Negative for abdominal pain, constipation, diarrhea, nausea and vomiting.  Genitourinary: Negative for dysuria and flank pain.  Musculoskeletal: Negative for arthralgias, back pain, gait problem and neck pain.  Skin: Negative for color change.  Allergic/Immunologic: Negative for environmental allergies and food allergies.  Neurological: Negative for dizziness and headaches.  Hematological: Does not bruise/bleed easily.  Psychiatric/Behavioral: Positive for depression. Negative for agitation, behavioral problems (depression) and hallucinations.   Vital Signs: BP 113/71   Pulse 75   Resp 16   Ht 5' 7"  (1.702 m)   Wt 217 lb (98.4 kg)   LMP 01/05/2016   SpO2 98%   BMI 33.99  kg/m   Physical Exam  Constitutional: She is oriented to person, place, and time. She appears well-developed and well-nourished. No distress.  HENT:  Head: Normocephalic and atraumatic.  Mouth/Throat: Oropharynx is clear and moist. No oropharyngeal exudate.  Eyes: Pupils are equal, round, and reactive to light. EOM are normal.  Neck: Normal range of motion. Neck supple. No JVD present. No tracheal deviation present. No thyromegaly present.  Cardiovascular: Normal rate, regular rhythm and normal heart sounds. Exam reveals no gallop and no friction rub.  No murmur heard. Pulmonary/Chest: Effort normal. No respiratory distress. She has no wheezes. She has no rales. She exhibits no tenderness.  Abdominal: Soft. Bowel sounds are normal.  Musculoskeletal: Normal range of motion.  Lymphadenopathy:    She has no cervical adenopathy.  Neurological: She is alert and oriented to person, place, and time. No cranial nerve deficit.  Skin: Skin is warm and dry. She is not  diaphoretic.  Psychiatric: She has a normal mood and affect. Her behavior is normal. Judgment and thought content normal.   LABS: Recent Results (from the past 2160 hour(s))  CBC with Differential/Platelet     Status: Abnormal   Collection Time: 08/22/17  8:40 PM  Result Value Ref Range   WBC 5.5 3.6 - 11.0 K/uL   RBC 5.05 3.80 - 5.20 MIL/uL   Hemoglobin 12.1 12.0 - 16.0 g/dL   HCT 37.3 35.0 - 47.0 %   MCV 74.0 (L) 80.0 - 100.0 fL   MCH 24.0 (L) 26.0 - 34.0 pg   MCHC 32.4 32.0 - 36.0 g/dL   RDW 17.2 (H) 11.5 - 14.5 %   Platelets 257 150 - 440 K/uL   Neutrophils Relative % 56 %   Neutro Abs 3.1 1.4 - 6.5 K/uL   Lymphocytes Relative 35 %   Lymphs Abs 1.9 1.0 - 3.6 K/uL   Monocytes Relative 7 %   Monocytes Absolute 0.4 0.2 - 0.9 K/uL   Eosinophils Relative 1 %   Eosinophils Absolute 0.1 0 - 0.7 K/uL   Basophils Relative 1 %   Basophils Absolute 0.0 0 - 0.1 K/uL    Comment: Performed at [facility], [address], [place], [place] [address]  Basic metabolic panel     Status: Abnormal   Collection Time: 08/22/17  8:40 PM  Result Value Ref Range   Sodium 139 135 - 145 mmol/L   Potassium 4.0 3.5 - 5.1 mmol/L   Chloride 109 101 - 111 mmol/L   CO2 22 22 - 32 mmol/L   Glucose, Bld 96 65 - 99 mg/dL   BUN 6 6 - 20 mg/dL   Creatinine, Ser 0.81 0.44 - 1.00 mg/dL   Calcium 8.7 (L) 8.9 - 10.3 mg/dL   GFR calc non Af Amer >60 >60 mL/min   GFR calc Af Amer >60 >60 mL/min    Comment: (NOTE) The eGFR has been calculated using the CKD EPI equation. This calculation has not been validated in all clinical situations. eGFR's persistently <60 mL/min signify possible Chronic Kidney Disease.    Anion gap 8 5 - 15    Comment: Performed at [facility], [address] [place], [place] [address]  Urinalysis, Complete w Microscopic     Status: Abnormal   Collection Time: 08/22/17  8:40 PM  Result Value Ref Range   Color, Urine YELLOW (A) YELLOW   APPearance CLOUDY (A) CLEAR   Specific Gravity, Urine 1.018 1.005 - 1.030   pH 8.0 5.0 - 8.0   Glucose, UA NEGATIVE NEGATIVE mg/dL   Hgb urine dipstick NEGATIVE NEGATIVE   Bilirubin Urine NEGATIVE NEGATIVE   Ketones, ur NEGATIVE NEGATIVE mg/dL   Protein, ur 30 (A) NEGATIVE mg/dL   Nitrite NEGATIVE NEGATIVE   Leukocytes, UA TRACE (A) NEGATIVE   RBC / HPF 0-5 0 - 5 RBC/hpf   WBC, UA 6-10 0 - 5 WBC/hpf   Bacteria, UA MANY (A) NONE SEEN   Squamous Epithelial / LPF 21-50 0 - 5   Mucus PRESENT    Amorphous Crystal PRESENT     Comment: Performed at [facility], [address] [place], [place] [address]  Urine Culture     Status: Abnormal   Collection Time: 08/22/17  8:40 PM  Result Value Ref Range   Specimen Description      URINE, RANDOM Performed at [facility], [address] [place], [place] [address]  Special Requests      NONE Performed at [facility], [address] [place], [place] [address]    Culture  MULTIPLE SPECIES PRESENT, SUGGEST RECOLLECTION (A)    Report Status 08/24/2017 FINAL    Assessment/Plan: 1. Encounter for general adult medical examination with abnormal findings - PHM  2. Non-seasonal allergic rhinitis, unspecified trigger - Allergy Test  3. Recurrent major depressive disorder, in full remission ([place]) - Per mental health  4. Migraine without aura, not intractable, without status migrainosus - continue Topamax   General Counseling: Tamme verbalizes understanding of the findings of todays visit and agrees with plan of treatment. I have discussed any further diagnostic evaluation that may be needed or ordered today. We also reviewed her medications today. she has been encouraged to call the office with any questions or concerns that should arise related to todays visit.   Orders Placed This Encounter  Procedures  . Allergy Test    Time spent:25 Dargan, MD  Internal Medicine

## 2017-11-08 ENCOUNTER — Ambulatory Visit (INDEPENDENT_AMBULATORY_CARE_PROVIDER_SITE_OTHER): Payer: Medicare Other | Admitting: Internal Medicine

## 2017-11-08 DIAGNOSIS — J301 Allergic rhinitis due to pollen: Secondary | ICD-10-CM | POA: Diagnosis not present

## 2017-11-08 NOTE — Progress Notes (Signed)
    [facility] Allergy MQT Recording Form  [place] [address] [place], [place] [address] Phone [phone] Fax [phone]   Patient Name: Teresa Crawford Age: 32 y.o. Sex: female Date of Service: @SERVICEDATE @   Performing Provider: Allyne Gee MD [facility]         Battery A Back   Site Antigen [facility] Flare  A1 Positive Control 6 6  A2 Negative Control 5 5  A3 American Elm 0 0  A4 Maple Box Elder 0 0  A5 Grass Mix 0 0  A6 Dock Sorrel Mix 0 0  A7 Russian Thistle 0 0  A8 Ragweed Mix 0 0  A9 English Pantain 0 0  A10 Oak Mix  0 0   Battery B Wheal Flare  B1 Lambs Quarters 0 0  B2 Cottonwood 0 0  B3 Pigweed Mix 0 0  B4 Acacia 0 0  B5 Pine Mix 0 0  B6 Privet 0 0  B7 White/Red Mulberry 0 0  B8 Western Water Hemp 0 0  B9 Guatemala Grass 0 0  B10 Melalucea 0 0   Battery C Wheal Flare  C1 Red River Birch 0 0  C2 Eastern Sycamore 0 0  C3 Bahai Grass 0 0  C4 American Beech 0 0  C5 Ash Mix 0 0  C6 Black Willow 0 0  C7 Hickory 0 0  C8 Black Walnut 0 0  C9 Red Cedar 0 0  C10 Sweet Gum  0 0   Battery D Wheal Flare  D1 Cultivated Oat 0 0  D2 Dog Fennel 0 0  D3 Common Mugwort 0 0  D4 Marsh Elder 0 0  D5 Johnson 0 0  D6 Hackberry Tree 0 0  D7 Bayberry Tree 0 0  D8 Cypress, Bald Tree 0 0  D9 Aspergillus Fumigatus 0 0  D10 Alternia  0 0   Battery E Wheal Flare  E1 Dreschlere 0 0  E2 Fusarium Mix 0 0  E3 Cladosporum Sph 0 0  E4 Bipolaris 0 0  E5 Penicillin chrys 0 0  E6 Cladosporum Herb 0 0  E7 Candida 0 0  E8 Aureobasidium 0 0  E9 Rhizopus 0 0  E10 Botrytis  0 0   Battery F Wheal Flare  F1 Aspergillus [place] 0 0  F2 Dust Mite Mix 0 0  F3 Cockroach Mix 0 0  F4 Cat Hair 0 0  F5 Dog Mixed breeds 0 0  F6 Feather Mix 0 0

## 2017-11-15 ENCOUNTER — Other Ambulatory Visit: Payer: Self-pay | Admitting: Internal Medicine

## 2017-12-03 ENCOUNTER — Other Ambulatory Visit: Payer: Self-pay | Admitting: Internal Medicine

## 2017-12-14 ENCOUNTER — Other Ambulatory Visit: Payer: Self-pay | Admitting: Internal Medicine

## 2018-01-11 ENCOUNTER — Ambulatory Visit (INDEPENDENT_AMBULATORY_CARE_PROVIDER_SITE_OTHER): Payer: Medicare Other | Admitting: Internal Medicine

## 2018-01-11 ENCOUNTER — Encounter: Payer: Self-pay | Admitting: Internal Medicine

## 2018-01-11 DIAGNOSIS — G43109 Migraine with aura, not intractable, without status migrainosus: Secondary | ICD-10-CM | POA: Diagnosis not present

## 2018-01-11 DIAGNOSIS — F4310 Post-traumatic stress disorder, unspecified: Secondary | ICD-10-CM | POA: Diagnosis not present

## 2018-01-11 DIAGNOSIS — R5383 Other fatigue: Secondary | ICD-10-CM | POA: Diagnosis not present

## 2018-01-11 DIAGNOSIS — Z23 Encounter for immunization: Secondary | ICD-10-CM

## 2018-01-11 DIAGNOSIS — E6609 Other obesity due to excess calories: Secondary | ICD-10-CM

## 2018-01-11 NOTE — Progress Notes (Addendum)
[facility] [place], [place] [address]  Internal MEDICINE  Office Visit Note  Patient Name: Teresa Crawford  [id number]  [id number]  Date of Service: 01/11/2018  Chief Complaint  Patient presents with  . Medical Management of Chronic Issues    3 month follow up  . Depression  . Asthma    HPI Pt is here for routine follow up. She is in a good mood today, with care giver, won a singing contest. Has gained weight. Does not feel depressed at the moment.  Current Medication: Outpatient Encounter Medications as of 01/11/2018  Medication Sig  . albuterol (PROAIR HFA) 108 (90 Base) MCG/ACT inhaler Inhale 2 puffs into the lungs every 6 (six) hours as needed for wheezing or shortness of breath.  . cetirizine (ZYRTEC) 10 MG tablet TAKE 1 TABLET BY MOUTH AT BEDTIME FOR ALLERGIES  . diazepam (VALIUM) 5 MG tablet INSERT 1 TABLET VAGINALLY ONCE DAILY AT BEDTIME  . docusate sodium (COLACE) 100 MG capsule TAKE 1 CAPSULE BY MOUTH TWICE DAILY FOR STOOL SOFTENER  . EPINEPHrine 0.3 mg/0.3 mL IJ SOAJ injection USE FOR ANAPHYLAXIS  . FLOVENT HFA 220 MCG/ACT inhaler INHALE 2 PUFFS INTO THE LUNGS, 2 TIMES PER DAY. RINSE MOUTH AFTER INHALATIONS  . fluticasone (FLONASE) 50 MCG/ACT nasal spray USE 1 SPRAY INTO EACH NOSTRIL ONCE DAILYFOR CONGESTION OR ALLERGIES  . omeprazole (PRILOSEC) 40 MG capsule TAKE 1 CAPSULE BY MOUTH TWICE DAILY  . ondansetron (ZOFRAN ODT) 4 MG disintegrating tablet Take 1 tablet (4 mg total) by mouth every 8 (eight) hours as needed for nausea or vomiting.  . SUMAtriptan (IMITREX) 50 MG tablet Take 50 mg by mouth once. Take 1 tab at the onshet of headache with zofran  . topiramate (TOPAMAX) 50 MG tablet TAKE 1 TABLET BY MOUTH EACH EVENING  . traMADol (ULTRAM) 50 MG tablet Take 2 tablets (100 mg total) by mouth every 6 (six) hours as needed for moderate pain.   No facility-administered encounter medications on file as of 01/11/2018.     Surgical History: Past  Surgical History:  Procedure Laterality Date  . ABDOMINAL HYSTERECTOMY    . BACK SURGERY    . COLONOSCOPY  04/27/2006   Wohl-colitis, internal hemorrhoids  . COLONOSCOPY  02/2013   focal right colitis, left colon biopsy negative, focal active proctitis without chronicity  . ESOPHAGOGASTRODUODENOSCOPY  02/2013   multiple peptic & duodenal ulcers, negative h pylori  . INTRAUTERINE DEVICE INSERTION  2012  . LAPAROSCOPIC ENDOMETRIOSIS FULGURATION  2015   [name]  . OVARIAN CYST REMOVAL      Medical History: Past Medical History:  Diagnosis Date  . B12 deficiency anemia   . Chronic back pain   . Chronic fatigue syndrome   . Constipation   . Depression   . Elevated liver enzymes   . Endometriosis   . Fibroids   . Hepatic steatosis   . Hypothyroid   . Last menstrual period (LMP) > 10 days ago 09/12/15  . Mental retardation   . Migraine   . Post traumatic stress disorder   . Posttraumatic stress disorder   . PUD (peptic ulcer disease)   . Scoliosis   . Splenomegaly   . Uterine fibroid     Family History: Family History  Problem Relation Age of Onset  . Breast cancer Mother 25  . COPD Father   . Lung cancer Father 67  . Dementia Father   . Diabetes Sister   . Diabetes Brother   .  Ovarian cancer Paternal Aunt   . Colon cancer Maternal Grandfather 48  . Liver disease Maternal Aunt        ?etiology  . Lymphoma Maternal Grandmother 60  . Lung cancer Other 58       maternal great grandfather  . Stomach cancer Other 12       maternal great grandmother    Social History   Socioeconomic History  . Marital status: Single    Spouse name: Not on file  . Number of children: 0  . Years of education: Not on file  . Highest education level: Not on file  Occupational History  . Not on file  Social Needs  . Financial resource strain: Not hard at all  . Food insecurity:    Worry: Never true    Inability: Never true  . Transportation needs:    Medical: No    Non-medical:  No  Tobacco Use  . Smoking status: Never Smoker  . Smokeless tobacco: Never Used  Substance and Sexual Activity  . Alcohol use: Not Currently    Alcohol/week: 0.0 standard drinks  . Drug use: No  . Sexual activity: Not Currently  Lifestyle  . Physical activity:    Days per week: 0 days    Minutes per session: 0 min  . Stress: Not at all  Relationships  . Social connections:    Talks on phone: Patient refused    Gets together: Patient refused    Attends religious service: Patient refused    Active member of club or organization: Patient refused    Attends meetings of clubs or organizations: Patient refused    Relationship status: Patient refused  . Intimate partner violence:    Fear of current or ex partner: Patient refused    Emotionally abused: Patient refused    Physically abused: Patient refused    Forced sexual activity: Patient refused  Other Topics Concern  . Not on file  Social History Narrative   She has brother, half brother & sister live nearby   Both parents deceased   Care managed by [name]      Review of Systems  Constitutional: Negative for chills, diaphoresis and fatigue.  HENT: Negative for ear pain, postnasal drip and sinus pressure.   Eyes: Negative for photophobia, discharge, redness, itching and visual disturbance.  Respiratory: Negative for cough, shortness of breath and wheezing.   Cardiovascular: Negative for chest pain, palpitations and leg swelling.  Gastrointestinal: Negative for abdominal pain, constipation, diarrhea, nausea and vomiting.  Genitourinary: Negative for dysuria and flank pain.  Musculoskeletal: Negative for arthralgias, back pain, gait problem and neck pain.  Skin: Negative for color change.  Allergic/Immunologic: Negative for environmental allergies and food allergies.  Neurological: Negative for dizziness and headaches.  Hematological: Does not bruise/bleed easily.  Psychiatric/Behavioral: Negative for agitation,  behavioral problems (depression) and hallucinations.    Vital Signs: BP 105/75 (BP Location: Right Arm, Patient Position: Sitting, Cuff Size: Large)   Pulse 74   Resp 16   Ht 5\' 6"  (1.676 m)   Wt 220 lb 9.6 oz (100.1 kg)   LMP 01/05/2016   SpO2 100%   BMI 35.61 kg/m    Physical Exam  Constitutional: She is oriented to person, place, and time. She appears well-developed and well-nourished. No distress.  HENT:  Head: Normocephalic and atraumatic.  Mouth/Throat: Oropharynx is clear and moist. No oropharyngeal exudate.  Eyes: Pupils are equal, round, and reactive to light. EOM are normal.  Neck: Normal  range of motion. Neck supple. No JVD present. No tracheal deviation present. No thyromegaly present.  Cardiovascular: Normal rate, regular rhythm and normal heart sounds. Exam reveals no gallop and no friction rub.  No murmur heard. Pulmonary/Chest: Effort normal. No respiratory distress. She has no wheezes. She has no rales. She exhibits no tenderness.  Abdominal: Soft. Bowel sounds are normal.  Musculoskeletal: Normal range of motion.  Lymphadenopathy:    She has no cervical adenopathy.  Neurological: She is alert and oriented to person, place, and time. No cranial nerve deficit.  Skin: Skin is warm and dry. She is not diaphoretic.  Psychiatric: She has a normal mood and affect. Her behavior is normal. Judgment and thought content normal.   Assessment/Plan: 1. Fatigue, unspecified type Deconditioning, pt is instructed to walk on a regular basis. Check b12 on next visit  2. PTSD (post-traumatic stress disorder) Stable with CBT. Takes diazepam   3. Migraine aura without headache Controlled with Topamax   4. Other obesity due to excess calories Obesity Counseling: Risk Assessment: An assessment of behavioral risk factors was made today and includes lack of exercise sedentary lifestyle, lack of portion control and poor dietary habits.  Risk Modification Advice: She was  counseled on portion control guidelines. Restricting daily caloric intake to1500. The detrimental long term effects of obesity on her health and ongoing poor compliance was also discussed with the patient.  5. Needs flu shot - Flu Vaccine MDCK QUAD PF  General Counseling: Teresa Crawford verbalizes understanding of the findings of todays visit and agrees with plan of treatment. I have discussed any further diagnostic evaluation that may be needed or ordered today. We also reviewed her medications today. she has been encouraged to call the office with any questions or concerns that should arise related to todays visit.    Orders Placed This Encounter  Procedures  . Flu Vaccine MDCK QUAD PF   Time spent: 49 Minutes Dr Lavera Guise Internal medicine

## 2018-02-04 ENCOUNTER — Other Ambulatory Visit: Payer: Self-pay | Admitting: Internal Medicine

## 2018-02-04 ENCOUNTER — Telehealth: Payer: Self-pay

## 2018-02-04 NOTE — Telephone Encounter (Signed)
Last here 01/11/18 next 05/31/2018

## 2018-02-04 NOTE — Telephone Encounter (Signed)
CALLED PT AND LMOM TO LET HER KNOW I CALLED IN REGARDS TO HER DIAZEPAM REFILL.  PT NEEDS TO SCHED AN APPT TO GET RX REFILLED.

## 2018-02-07 ENCOUNTER — Ambulatory Visit: Payer: Self-pay | Admitting: Adult Health

## 2018-02-08 ENCOUNTER — Encounter: Payer: Self-pay | Admitting: Adult Health

## 2018-02-08 ENCOUNTER — Ambulatory Visit (INDEPENDENT_AMBULATORY_CARE_PROVIDER_SITE_OTHER): Payer: Medicare Other | Admitting: Adult Health

## 2018-02-08 VITALS — BP 110/76 | HR 78 | Resp 16 | Ht 64.0 in | Wt 219.0 lb

## 2018-02-08 DIAGNOSIS — F79 Unspecified intellectual disabilities: Secondary | ICD-10-CM | POA: Diagnosis not present

## 2018-02-08 DIAGNOSIS — F331 Major depressive disorder, recurrent, moderate: Secondary | ICD-10-CM | POA: Diagnosis not present

## 2018-02-08 DIAGNOSIS — F4310 Post-traumatic stress disorder, unspecified: Secondary | ICD-10-CM

## 2018-02-08 DIAGNOSIS — E6609 Other obesity due to excess calories: Secondary | ICD-10-CM | POA: Diagnosis not present

## 2018-02-08 MED ORDER — DIAZEPAM 5 MG PO TABS: ORAL_TABLET | ORAL | 3 refills | Status: DC

## 2018-02-08 NOTE — Progress Notes (Signed)
[facility] [place], [place] [address]  Internal MEDICINE  Office Visit Note  Patient Name: Teresa Crawford  [id number]  [id number]  Date of Service: 02/08/2018  Chief Complaint  Patient presents with  . Anxiety    medication refills     HPI  Pt here for follow up on anxiety/depression.  She is in need of refill on her Valium.  Patient lives in a group home and 1 of the caregivers is present at this exam.  They reported they were told they need to come in for a face-to-face visit in order to refill her Valium.  She reports patient has been doing well denies any issues with patient's anxiety or depression.  She is currently out of her diazepam and does not have another appointment for 3 and half months.   Current Medication: Outpatient Encounter Medications as of 02/08/2018  Medication Sig  . albuterol (PROAIR HFA) 108 (90 Base) MCG/ACT inhaler Inhale 2 puffs into the lungs every 6 (six) hours as needed for wheezing or shortness of breath.  . cetirizine (ZYRTEC) 10 MG tablet TAKE 1 TABLET BY MOUTH AT BEDTIME FOR ALLERGIES  . diazepam (VALIUM) 5 MG tablet INSERT 1 TABLET VAGINALLY ONCE DAILY AT BEDTIME  . docusate sodium (COLACE) 100 MG capsule TAKE 1 CAPSULE BY MOUTH TWICE DAILY FOR STOOL SOFTENER  . EPINEPHrine 0.3 mg/0.3 mL IJ SOAJ injection USE FOR ANAPHYLAXIS  . FLOVENT HFA 220 MCG/ACT inhaler INHALE 2 PUFFS INTO THE LUNGS, 2 TIMES PER DAY. RINSE MOUTH AFTER INHALATIONS  . fluticasone (FLONASE) 50 MCG/ACT nasal spray USE 1 SPRAY INTO EACH NOSTRIL ONCE DAILYFOR CONGESTION OR ALLERGIES  . omeprazole (PRILOSEC) 40 MG capsule TAKE 1 CAPSULE BY MOUTH TWICE DAILY  . ondansetron (ZOFRAN ODT) 4 MG disintegrating tablet Take 1 tablet (4 mg total) by mouth every 8 (eight) hours as needed for nausea or vomiting.  . SUMAtriptan (IMITREX) 50 MG tablet Take 50 mg by mouth once. Take 1 tab at the onshet of headache with zofran  . topiramate (TOPAMAX) 50 MG tablet TAKE 1  TABLET BY MOUTH EACH EVENING  . traMADol (ULTRAM) 50 MG tablet Take 2 tablets (100 mg total) by mouth every 6 (six) hours as needed for moderate pain.  . [DISCONTINUED] diazepam (VALIUM) 5 MG tablet INSERT 1 TABLET VAGINALLY ONCE DAILY AT BEDTIME   No facility-administered encounter medications on file as of 02/08/2018.     Surgical History: Past Surgical History:  Procedure Laterality Date  . ABDOMINAL HYSTERECTOMY    . BACK SURGERY    . COLONOSCOPY  04/27/2006   Wohl-colitis, internal hemorrhoids  . COLONOSCOPY  02/2013   focal right colitis, left colon biopsy negative, focal active proctitis without chronicity  . ESOPHAGOGASTRODUODENOSCOPY  02/2013   multiple peptic & duodenal ulcers, negative h pylori  . INTRAUTERINE DEVICE INSERTION  2012  . LAPAROSCOPIC ENDOMETRIOSIS FULGURATION  2015   [name]  . OVARIAN CYST REMOVAL      Medical History: Past Medical History:  Diagnosis Date  . B12 deficiency anemia   . Chronic back pain   . Chronic fatigue syndrome   . Constipation   . Depression   . Elevated liver enzymes   . Endometriosis   . Fibroids   . Hepatic steatosis   . Hypothyroid   . Last menstrual period (LMP) > 10 days ago 09/12/15  . Mental retardation   . Migraine   . Post traumatic stress disorder   . Posttraumatic stress disorder   .  PUD (peptic ulcer disease)   . Scoliosis   . Splenomegaly   . Uterine fibroid     Family History: Family History  Problem Relation Age of Onset  . Breast cancer Mother 70  . COPD Father   . Lung cancer Father 93  . Dementia Father   . Diabetes Sister   . Diabetes Brother   . Ovarian cancer Paternal Aunt   . Colon cancer Maternal Grandfather 37  . Liver disease Maternal Aunt        ?etiology  . Lymphoma Maternal Grandmother 60  . Lung cancer Other 86       maternal great grandfather  . Stomach cancer Other 64       maternal great grandmother    Social History   Socioeconomic History  . Marital status: Single     Spouse name: Not on file  . Number of children: 0  . Years of education: Not on file  . Highest education level: Not on file  Occupational History  . Not on file  Social Needs  . Financial resource strain: Not hard at all  . Food insecurity:    Worry: Never true    Inability: Never true  . Transportation needs:    Medical: No    Non-medical: No  Tobacco Use  . Smoking status: Never Smoker  . Smokeless tobacco: Never Used  Substance and Sexual Activity  . Alcohol use: Not Currently    Alcohol/week: 0.0 standard drinks  . Drug use: No  . Sexual activity: Not Currently  Lifestyle  . Physical activity:    Days per week: 0 days    Minutes per session: 0 min  . Stress: Not at all  Relationships  . Social connections:    Talks on phone: Patient refused    Gets together: Patient refused    Attends religious service: Patient refused    Active member of club or organization: Patient refused    Attends meetings of clubs or organizations: Patient refused    Relationship status: Patient refused  . Intimate partner violence:    Fear of current or ex partner: Patient refused    Emotionally abused: Patient refused    Physically abused: Patient refused    Forced sexual activity: Patient refused  Other Topics Concern  . Not on file  Social History Narrative   She has brother, half brother & sister live nearby   Both parents deceased   Care managed by [name]      Review of Systems  Constitutional: Negative for chills, fatigue and unexpected weight change.  HENT: Negative for congestion, rhinorrhea, sneezing and sore throat.   Eyes: Negative for photophobia, pain and redness.  Respiratory: Negative for cough, chest tightness and shortness of breath.   Cardiovascular: Negative for chest pain and palpitations.  Gastrointestinal: Negative for abdominal pain, constipation, diarrhea, nausea and vomiting.  Endocrine: Negative.   Genitourinary: Negative for dysuria and frequency.   Musculoskeletal: Negative for arthralgias, back pain, joint swelling and neck pain.  Skin: Negative for rash.  Allergic/Immunologic: Negative.   Neurological: Negative for tremors and numbness.  Hematological: Negative for adenopathy. Does not bruise/bleed easily.  Psychiatric/Behavioral: Negative for behavioral problems and sleep disturbance. The patient is not nervous/anxious.     Vital Signs: BP 110/76 (BP Location: Left Arm, Patient Position: Sitting, Cuff Size: Normal)   Pulse 78   Resp 16   Ht 5\' 4"  (1.626 m)   Wt 219 lb (99.3 kg)   LMP  01/05/2016   SpO2 97%   BMI 37.59 kg/m    Physical Exam  Constitutional: She is oriented to person, place, and time. She appears well-developed and well-nourished. No distress.  HENT:  Head: Normocephalic and atraumatic.  Mouth/Throat: Oropharynx is clear and moist. No oropharyngeal exudate.  Eyes: Pupils are equal, round, and reactive to light. EOM are normal.  Neck: Normal range of motion. Neck supple. No JVD present. No tracheal deviation present. No thyromegaly present.  Cardiovascular: Normal rate, regular rhythm and normal heart sounds. Exam reveals no gallop and no friction rub.  No murmur heard. Pulmonary/Chest: Effort normal and breath sounds normal. No respiratory distress. She has no wheezes. She has no rales. She exhibits no tenderness.  Abdominal: Soft. There is no tenderness. There is no guarding.  Musculoskeletal: Normal range of motion.  Lymphadenopathy:    She has no cervical adenopathy.  Neurological: She is alert and oriented to person, place, and time. No cranial nerve deficit.  Skin: Skin is warm and dry. She is not diaphoretic.  Psychiatric: She has a normal mood and affect. Her behavior is normal. Judgment and thought content normal.  Nursing note and vitals reviewed.  Assessment/Plan: 1. PTSD (post-traumatic stress disorder) Continue take all medications as prescribed.  Continue using diazepam as directed.  2.  Major depressive disorder, recurrent episode, moderate (HCC) Uses 5 mg of Valium at night vaginally.  Denies any issues with this and reports that this started when she had a hysterectomy years ago.  Pts valium refilled at this time through next visit.   3. Intellectual disability Stable at this time.  Patient lives in a group home where she appears to be receiving conference of care.  4. Other obesity due to excess calories Obesity Counseling: Risk Assessment: An assessment of behavioral risk factors was made today and includes lack of exercise sedentary lifestyle, lack of portion control and poor dietary habits.  Risk Modification Advice: She was counseled on portion control guidelines. Restricting daily caloric intake to. . The detrimental long term effects of obesity on her health and ongoing poor compliance was also discussed with the patient.    General Counseling: gilma bessette understanding of the findings of todays visit and agrees with plan of treatment. I have discussed any further diagnostic evaluation that may be needed or ordered today. We also reviewed her medications today. she has been encouraged to call the office with any questions or concerns that should arise related to todays visit.    No orders of the defined types were placed in this encounter.   Meds ordered this encounter  Medications  . diazepam (VALIUM) 5 MG tablet    Sig: INSERT 1 TABLET VAGINALLY ONCE DAILY AT BEDTIME    Dispense:  30 tablet    Refill:  3    Time spent: 25 Minutes   This patient was seen by Orson Gear AGNP-C in Collaboration with Dr Lavera Guise as a part of collaborative care agreement     Kendell Bane AGNP-C Internal medicine

## 2018-04-30 ENCOUNTER — Other Ambulatory Visit: Payer: Self-pay | Admitting: Internal Medicine

## 2018-05-06 ENCOUNTER — Other Ambulatory Visit: Payer: Self-pay | Admitting: Adult Health

## 2018-05-06 DIAGNOSIS — F4310 Post-traumatic stress disorder, unspecified: Secondary | ICD-10-CM

## 2018-05-06 DIAGNOSIS — F331 Major depressive disorder, recurrent, moderate: Secondary | ICD-10-CM

## 2018-05-06 NOTE — Telephone Encounter (Signed)
Can you do 1 refills she had appt on 05/31/2018

## 2018-05-31 ENCOUNTER — Ambulatory Visit (INDEPENDENT_AMBULATORY_CARE_PROVIDER_SITE_OTHER): Payer: Medicare Other | Admitting: Internal Medicine

## 2018-05-31 ENCOUNTER — Encounter: Payer: Self-pay | Admitting: Internal Medicine

## 2018-05-31 DIAGNOSIS — F411 Generalized anxiety disorder: Secondary | ICD-10-CM

## 2018-05-31 DIAGNOSIS — F4310 Post-traumatic stress disorder, unspecified: Secondary | ICD-10-CM | POA: Diagnosis not present

## 2018-05-31 DIAGNOSIS — E6609 Other obesity due to excess calories: Secondary | ICD-10-CM

## 2018-05-31 DIAGNOSIS — G43109 Migraine with aura, not intractable, without status migrainosus: Secondary | ICD-10-CM

## 2018-05-31 DIAGNOSIS — F331 Major depressive disorder, recurrent, moderate: Secondary | ICD-10-CM

## 2018-05-31 MED ORDER — ALBUTEROL SULFATE HFA 108 (90 BASE) MCG/ACT IN AERS: 2.0000 | INHALATION_SPRAY | Freq: Four times a day (QID) | RESPIRATORY_TRACT | 3 refills | Status: DC | PRN

## 2018-05-31 MED ORDER — ACETAMINOPHEN 500 MG PO TABS: ORAL_TABLET | ORAL | 3 refills | Status: DC

## 2018-05-31 MED ORDER — CETIRIZINE HCL 10 MG PO TABS: 10.0000 mg | ORAL_TABLET | Freq: Every day | ORAL | 3 refills | Status: DC

## 2018-05-31 MED ORDER — DIAZEPAM 5 MG PO TABS: ORAL_TABLET | ORAL | 2 refills | Status: DC

## 2018-05-31 MED ORDER — IBUPROFEN 600 MG PO TABS: ORAL_TABLET | ORAL | 3 refills | Status: DC

## 2018-05-31 MED ORDER — TOPIRAMATE 50 MG PO TABS: ORAL_TABLET | ORAL | 3 refills | Status: DC

## 2018-05-31 MED ORDER — TRAMADOL HCL 50 MG PO TABS: ORAL_TABLET | ORAL | 2 refills | Status: DC

## 2018-05-31 MED ORDER — SUMATRIPTAN SUCCINATE 50 MG PO TABS: 50.0000 mg | ORAL_TABLET | Freq: Once | ORAL | 3 refills | Status: DC

## 2018-05-31 NOTE — Progress Notes (Signed)
[facility] [place], [place] [address]  Internal MEDICINE  Office Visit Note  Patient Name: Teresa Crawford  [id number]  [id number]  Date of Service: 06/09/2018  Chief Complaint  Patient presents with  . Medical Management of Chronic Issues    4 month follow up  . Asthma  . Depression    HPI  Pt is here to get refills on her medications, overall she is feels better, She is here with her Education officer, museum, PTSD and major depression, has gained some more weight, trying to find a job  Current Medication: Outpatient Encounter Medications as of 05/31/2018  Medication Sig  . albuterol (PROAIR HFA) 108 (90 Base) MCG/ACT inhaler Inhale 2 puffs into the lungs every 6 (six) hours as needed for wheezing or shortness of breath.  . cetirizine (ZYRTEC) 10 MG tablet Take 1 tablet (10 mg total) by mouth daily.  . diazepam (VALIUM) 5 MG tablet INSERT INTRAVAGINAL QHS  . docusate sodium (COLACE) 100 MG capsule TAKE 1 CAPSULE BY MOUTH TWICE DAILY FOR STOOL SOFTENER  . EPINEPHrine 0.3 mg/0.3 mL IJ SOAJ injection USE FOR ANAPHYLAXIS  . FLOVENT HFA 220 MCG/ACT inhaler INHALE 2 PUFFS INTO THE LUNGS, 2 TIMES PER DAY. RINSE MOUTH AFTER INHALATIONS  . fluticasone (FLONASE) 50 MCG/ACT nasal spray USE 1 SPRAY INTO EACH NOSTRIL ONCE DAILYFOR CONGESTION OR ALLERGIES  . omeprazole (PRILOSEC) 40 MG capsule TAKE 1 CAPSULE BY MOUTH TWICE DAILY  . ondansetron (ZOFRAN ODT) 4 MG disintegrating tablet Take 1 tablet (4 mg total) by mouth every 8 (eight) hours as needed for nausea or vomiting.  . SUMAtriptan (IMITREX) 50 MG tablet Take 1 tablet (50 mg total) by mouth once for 1 dose. Take 1 tab at the onshet of headache with zofran  . topiramate (TOPAMAX) 50 MG tablet TAKE 1 TABLET BY MOUTH EACH EVENING  . [DISCONTINUED] albuterol (PROAIR HFA) 108 (90 Base) MCG/ACT inhaler Inhale 2 puffs into the lungs every 6 (six) hours as needed for wheezing or shortness of breath.  . [DISCONTINUED] cetirizine  (ZYRTEC) 10 MG tablet TAKE 1 TABLET BY MOUTH AT BEDTIME FOR ALLERGIES  . [DISCONTINUED] diazepam (VALIUM) 5 MG tablet INSERT 1 TABLET VAGINALLY ONCE DAILY AT BEDTIME  . [DISCONTINUED] SUMAtriptan (IMITREX) 50 MG tablet Take 50 mg by mouth once. Take 1 tab at the onshet of headache with zofran  . [DISCONTINUED] topiramate (TOPAMAX) 50 MG tablet TAKE 1 TABLET BY MOUTH EACH EVENING  . [DISCONTINUED] traMADol (ULTRAM) 50 MG tablet Take 2 tablets (100 mg total) by mouth every 6 (six) hours as needed for moderate pain.  . [DISCONTINUED] traMADol (ULTRAM) 50 MG tablet ONE TAB PO BID PRN FOR PAIN AND HEADCAHES  . acetaminophen (TYLENOL) 500 MG tablet 2 TABS PO BID PRN FOR HEADACHES AND PAINS  . ibuprofen (ADVIL,MOTRIN) 600 MG tablet ONE TAB PO BID PRN FOR PAIN AND HEADACHES   No facility-administered encounter medications on file as of 05/31/2018.     Surgical History: Past Surgical History:  Procedure Laterality Date  . ABDOMINAL HYSTERECTOMY    . BACK SURGERY    . COLONOSCOPY  04/27/2006   Wohl-colitis, internal hemorrhoids  . COLONOSCOPY  02/2013   focal right colitis, left colon biopsy negative, focal active proctitis without chronicity  . ESOPHAGOGASTRODUODENOSCOPY  02/2013   multiple peptic & duodenal ulcers, negative h pylori  . INTRAUTERINE DEVICE INSERTION  2012  . LAPAROSCOPIC ENDOMETRIOSIS FULGURATION  2015   [name]  . OVARIAN CYST REMOVAL  Medical History: Past Medical History:  Diagnosis Date  . B12 deficiency anemia   . Chronic back pain   . Chronic fatigue syndrome   . Constipation   . Depression   . Elevated liver enzymes   . Endometriosis   . Fibroids   . Hepatic steatosis   . Hypothyroid   . Last menstrual period (LMP) > 10 days ago 09/12/15  . Mental retardation   . Migraine   . Post traumatic stress disorder   . Posttraumatic stress disorder   . PUD (peptic ulcer disease)   . Scoliosis   . Splenomegaly   . Uterine fibroid     Family History: Family  History  Problem Relation Age of Onset  . Breast cancer Mother 50  . COPD Father   . Lung cancer Father 25  . Dementia Father   . Diabetes Sister   . Diabetes Brother   . Ovarian cancer Paternal Aunt   . Colon cancer Maternal Grandfather 47  . Liver disease Maternal Aunt        ?etiology  . Lymphoma Maternal Grandmother 60  . Lung cancer Other 33       maternal great grandfather  . Stomach cancer Other 3       maternal great grandmother    Social History   Socioeconomic History  . Marital status: Single    Spouse name: Not on file  . Number of children: 0  . Years of education: Not on file  . Highest education level: Not on file  Occupational History  . Not on file  Social Needs  . Financial resource strain: Not hard at all  . Food insecurity:    Worry: Never true    Inability: Never true  . Transportation needs:    Medical: No    Non-medical: No  Tobacco Use  . Smoking status: Never Smoker  . Smokeless tobacco: Never Used  Substance and Sexual Activity  . Alcohol use: Not Currently    Alcohol/week: 0.0 standard drinks  . Drug use: No  . Sexual activity: Not Currently  Lifestyle  . Physical activity:    Days per week: 0 days    Minutes per session: 0 min  . Stress: Not at all  Relationships  . Social connections:    Talks on phone: Patient refused    Gets together: Patient refused    Attends religious service: Patient refused    Active member of club or organization: Patient refused    Attends meetings of clubs or organizations: Patient refused    Relationship status: Patient refused  . Intimate partner violence:    Fear of current or ex partner: Patient refused    Emotionally abused: Patient refused    Physically abused: Patient refused    Forced sexual activity: Patient refused  Other Topics Concern  . Not on file  Social History Narrative   She has brother, half brother & sister live nearby   Both parents deceased   Care managed by [name]       Review of Systems  Constitutional: Negative for chills, diaphoresis and fatigue.  HENT: Negative for ear pain, postnasal drip and sinus pressure.   Eyes: Negative for photophobia, discharge, redness, itching and visual disturbance.  Respiratory: Negative for cough, shortness of breath and wheezing.   Cardiovascular: Negative for chest pain, palpitations and leg swelling.  Gastrointestinal: Negative for abdominal pain, constipation, diarrhea, nausea and vomiting.  Genitourinary: Negative for dysuria and flank pain.  Musculoskeletal:  Negative for arthralgias, back pain, gait problem and neck pain.  Skin: Negative for color change.  Allergic/Immunologic: Negative for environmental allergies and food allergies.  Neurological: Negative for dizziness and headaches.  Hematological: Does not bruise/bleed easily.  Psychiatric/Behavioral: Negative for agitation, behavioral problems (depression) and hallucinations.   Vital Signs: BP 111/68 (BP Location: Right Arm, Patient Position: Sitting, Cuff Size: Large)   Pulse 66   Resp 16   Ht 5\' 4"  (1.626 m)   Wt 220 lb 3.2 oz (99.9 kg)   LMP 01/05/2016   SpO2 100%   BMI 37.80 kg/m    Physical Exam Constitutional:      General: She is not in acute distress.    Appearance: She is well-developed. She is not diaphoretic.  HENT:     Head: Normocephalic and atraumatic.     Mouth/Throat:     Pharynx: No oropharyngeal exudate.  Eyes:     Pupils: Pupils are equal, round, and reactive to light.  Neck:     Musculoskeletal: Normal range of motion and neck supple.     Thyroid: No thyromegaly.     Vascular: No JVD.     Trachea: No tracheal deviation.  Cardiovascular:     Rate and Rhythm: Normal rate and regular rhythm.     Heart sounds: Normal heart sounds. No murmur. No friction rub. No gallop.   Pulmonary:     Effort: Pulmonary effort is normal. No respiratory distress.     Breath sounds: No wheezing or rales.  Chest:     Chest wall: No  tenderness.  Abdominal:     General: Bowel sounds are normal.     Palpations: Abdomen is soft.  Musculoskeletal: Normal range of motion.  Lymphadenopathy:     Cervical: No cervical adenopathy.  Skin:    General: Skin is warm and dry.  Neurological:     Mental Status: She is alert and oriented to person, place, and time.     Cranial Nerves: No cranial nerve deficit.  Psychiatric:        Behavior: Behavior normal.        Thought Content: Thought content normal.        Judgment: Judgment normal.    Assessment/Plan: 1. Migraine aura without headache - Controlled   2. PTSD (post-traumatic stress disorder) - Continue to see her therapist  - diazepam (VALIUM) 5 MG tablet; INSERT INTRAVAGINAL QHS  Dispense: 30 tablet; Refill: 2  3. Major depressive disorder, recurrent episode, moderate (HCC) - stable  - diazepam (VALIUM) 5 MG tablet; INSERT INTRAVAGINAL QHS  Dispense: 30 tablet; Refill: 2  4. Other obesity due to excess calories - Encouraged weight loss   5. Generalized anxiety disorder - Controlled   General Counseling: jaquaya coyle understanding of the findings of todays visit and agrees with plan of treatment. I have discussed any further diagnostic evaluation that may be needed or ordered today. We also reviewed her medications today. she has been encouraged to call the office with any questions or concerns that should arise related to todays visit.   Meds ordered this encounter  Medications  . DISCONTD: traMADol (ULTRAM) 50 MG tablet    Sig: ONE TAB PO BID PRN FOR PAIN AND HEADCAHES    Dispense:  20 tablet    Refill:  2  . ibuprofen (ADVIL,MOTRIN) 600 MG tablet    Sig: ONE TAB PO BID PRN FOR PAIN AND HEADACHES    Dispense:  30 tablet    Refill:  3  .  acetaminophen (TYLENOL) 500 MG tablet    Sig: 2 TABS PO BID PRN FOR HEADACHES AND PAINS    Dispense:  45 tablet    Refill:  3  . albuterol (PROAIR HFA) 108 (90 Base) MCG/ACT inhaler    Sig: Inhale 2 puffs into the  lungs every 6 (six) hours as needed for wheezing or shortness of breath.    Dispense:  1 Inhaler    Refill:  3  . cetirizine (ZYRTEC) 10 MG tablet    Sig: Take 1 tablet (10 mg total) by mouth daily.    Dispense:  30 tablet    Refill:  3    NEED REFILLS ASAP.  THANK YOU!  . diazepam (VALIUM) 5 MG tablet    Sig: INSERT INTRAVAGINAL QHS    Dispense:  30 tablet    Refill:  2    REFILL NEEDED FOR NEXT MONTH THANKS  . topiramate (TOPAMAX) 50 MG tablet    Sig: TAKE 1 TABLET BY MOUTH EACH EVENING    Dispense:  30 tablet    Refill:  3  . SUMAtriptan (IMITREX) 50 MG tablet    Sig: Take 1 tablet (50 mg total) by mouth once for 1 dose. Take 1 tab at the onshet of headache with zofran    Dispense:  10 tablet    Refill:  3    Time spent:25 Minutes   Dr Lavera Guise Internal medicine

## 2018-06-01 ENCOUNTER — Other Ambulatory Visit: Payer: Self-pay | Admitting: Adult Health

## 2018-06-01 MED ORDER — TRAMADOL HCL 50 MG PO TABS: ORAL_TABLET | ORAL | 2 refills | Status: DC

## 2018-06-01 NOTE — Progress Notes (Signed)
RX for tramadol resent electronically to pts pharmacy.

## 2018-06-14 ENCOUNTER — Ambulatory Visit (INDEPENDENT_AMBULATORY_CARE_PROVIDER_SITE_OTHER): Payer: Medicare Other | Admitting: Psychiatry

## 2018-06-14 ENCOUNTER — Encounter: Payer: Self-pay | Admitting: Psychiatry

## 2018-06-14 ENCOUNTER — Other Ambulatory Visit: Payer: Self-pay

## 2018-06-14 VITALS — BP 109/75 | HR 80 | Temp 97.9°F | Wt 216.0 lb

## 2018-06-14 DIAGNOSIS — F331 Major depressive disorder, recurrent, moderate: Secondary | ICD-10-CM

## 2018-06-14 DIAGNOSIS — F79 Unspecified intellectual disabilities: Secondary | ICD-10-CM | POA: Diagnosis not present

## 2018-06-14 DIAGNOSIS — F4310 Post-traumatic stress disorder, unspecified: Secondary | ICD-10-CM

## 2018-06-14 MED ORDER — TRAZODONE HCL 50 MG PO TABS: 50.0000 mg | ORAL_TABLET | Freq: Every day | ORAL | 1 refills | Status: DC

## 2018-06-14 NOTE — Progress Notes (Signed)
Patient ID: Teresa Crawford, female   DOB: 01-Jan-1986, 33 y.o.   MRN: [id number]  [facility] MD/PA/NP OP Progress Note  06/14/2018 12:57 PM Teresa Crawford  MRN:  [id number]  Subjective:  Follow-up of her posttraumatic stress disorder and major depressive disorder, recurrent moderate. Reports being stressed , her sister has diabetes with complications. She is worried about her sister. She was also taken off the cymbalta and the trazodone since last July by her primary care provider Dr.Khan. at that time she was doing quite well. She reports having trouble sleeping.  Chief Complaint: stressed and trouble sleeping Chief Complaint    Follow-up; Medication Refill     Visit Diagnosis:     ICD-10-CM   1. Posttraumatic stress disorder F43.10   2. Major depressive disorder, recurrent episode, moderate (HCC) F33.1   3. Intellectual disability F98     Past Medical History:  Past Medical History:  Diagnosis Date  . B12 deficiency anemia   . Chronic back pain   . Chronic fatigue syndrome   . Constipation   . Depression   . Elevated liver enzymes   . Endometriosis   . Fibroids   . Hepatic steatosis   . Hypothyroid   . Last menstrual period (LMP) > 10 days ago 09/12/15  . Mental retardation   . Migraine   . Post traumatic stress disorder   . Posttraumatic stress disorder   . PUD (peptic ulcer disease)   . Scoliosis   . Splenomegaly   . Uterine fibroid     Past Surgical History:  Procedure Laterality Date  . ABDOMINAL HYSTERECTOMY    . BACK SURGERY    . COLONOSCOPY  04/27/2006   Wohl-colitis, internal hemorrhoids  . COLONOSCOPY  02/2013   focal right colitis, left colon biopsy negative, focal active proctitis without chronicity  . ESOPHAGOGASTRODUODENOSCOPY  02/2013   multiple peptic & duodenal ulcers, negative h pylori  . INTRAUTERINE DEVICE INSERTION  2012  . LAPAROSCOPIC ENDOMETRIOSIS FULGURATION  2015   [name]  . OVARIAN CYST REMOVAL     Family History:  Family History  Problem  Relation Age of Onset  . Breast cancer Mother 7  . COPD Father   . Lung cancer Father 46  . Dementia Father   . Diabetes Sister   . Diabetes Brother   . Ovarian cancer Paternal Aunt   . Colon cancer Maternal Grandfather 72  . Liver disease Maternal Aunt        ?etiology  . Lymphoma Maternal Grandmother 60  . Lung cancer Other 30       maternal great grandfather  . Stomach cancer Other 74       maternal great grandmother   Social History:  Social History   Socioeconomic History  . Marital status: Single    Spouse name: Not on file  . Number of children: 0  . Years of education: Not on file  . Highest education level: Not on file  Occupational History  . Not on file  Social Needs  . Financial resource strain: Not hard at all  . Food insecurity:    Worry: Never true    Inability: Never true  . Transportation needs:    Medical: No    Non-medical: No  Tobacco Use  . Smoking status: Never Smoker  . Smokeless tobacco: Never Used  Substance and Sexual Activity  . Alcohol use: Not Currently    Alcohol/week: 0.0 standard drinks  . Drug use: No  . Sexual activity:  Not Currently  Lifestyle  . Physical activity:    Days per week: 0 days    Minutes per session: 0 min  . Stress: Not at all  Relationships  . Social connections:    Talks on phone: Patient refused    Gets together: Patient refused    Attends religious service: Patient refused    Active member of club or organization: Patient refused    Attends meetings of clubs or organizations: Patient refused    Relationship status: Patient refused  Other Topics Concern  . Not on file  Social History Narrative   She has brother, half brother & sister live nearby   Both parents deceased   Care managed by [name]   Additional History:   Assessment:   Musculoskeletal: Strength & Muscle Tone: within normal limits Gait & Station: normal Patient leans: N/A  Psychiatric Specialty Exam: Medication Refill    Depression         Associated symptoms include does not have insomnia and no suicidal ideas.   Review of Systems  Endo/Heme/Allergies:       Patient reports that have an ultrasound of her thyroid.  Psychiatric/Behavioral: Negative for depression, hallucinations, memory loss, substance abuse and suicidal ideas. The patient is not nervous/anxious and does not have insomnia.   All other systems reviewed and are negative.   Blood pressure 109/75, pulse 80, temperature 97.9 F (36.6 C), temperature source Oral, weight 216 lb (98 kg), last menstrual period 01/05/2016.Body mass index is 37.08 kg/m.  General Appearance: Fairly Groomed  Eye Contact:  Fair  Speech:  Clear and Coherent and Normal Rate  Volume:  Normal  Mood:  Good  Affect:  Congruent   Thought Process:  Concrete  Orientation:  Full (Time, Place, and Person)  Thought Content:  Negative  Suicidal Thoughts:  No  Homicidal Thoughts:  No  Memory:  Immediate;   Good Recent;   Good Remote;   Good  Judgement:  Fair  Insight:  Fair  Psychomotor Activity:  Negative  Concentration:  Good  Recall:  Good  Fund of Knowledge: Fair  Language: Fair  Akathisia:  Negative  Handed:  Right unknown  AIMS (if indicated):  Not done  Assets:  Social Support  ADL's:  Intact  Cognition: Impaired,  Moderate  Sleep:  Good    Is the patient at risk to self?  No. Has the patient been a risk to self in the past 6 months?  No. Has the patient been a risk to self within the distant past?  No. Is the patient a risk to others?  No. Has the patient been a risk to others in the past 6 months?  No. Has the patient been a risk to others within the distant past?  No.  Current Medications: Current Outpatient Medications  Medication Sig Dispense Refill  . acetaminophen (TYLENOL) 500 MG tablet 2 TABS PO BID PRN FOR HEADACHES AND PAINS 45 tablet 3  . albuterol (PROAIR HFA) 108 (90 Base) MCG/ACT inhaler Inhale 2 puffs into the lungs every 6 (six)  hours as needed for wheezing or shortness of breath. 1 Inhaler 3  . cetirizine (ZYRTEC) 10 MG tablet Take 1 tablet (10 mg total) by mouth daily. 30 tablet 3  . diazepam (VALIUM) 5 MG tablet INSERT INTRAVAGINAL QHS 30 tablet 2  . docusate sodium (COLACE) 100 MG capsule TAKE 1 CAPSULE BY MOUTH TWICE DAILY FOR STOOL SOFTENER 60 capsule 3  . EPINEPHrine 0.3 mg/0.3 mL IJ SOAJ  injection USE FOR ANAPHYLAXIS 1 Device 5  . FLOVENT HFA 220 MCG/ACT inhaler INHALE 2 PUFFS INTO THE LUNGS, 2 TIMES PER DAY. RINSE MOUTH AFTER INHALATIONS 12 g 11  . fluticasone (FLONASE) 50 MCG/ACT nasal spray USE 1 SPRAY INTO EACH NOSTRIL ONCE DAILYFOR CONGESTION OR ALLERGIES 16 g 2  . ibuprofen (ADVIL,MOTRIN) 600 MG tablet ONE TAB PO BID PRN FOR PAIN AND HEADACHES 30 tablet 3  . omeprazole (PRILOSEC) 40 MG capsule TAKE 1 CAPSULE BY MOUTH TWICE DAILY 60 capsule 6  . topiramate (TOPAMAX) 50 MG tablet TAKE 1 TABLET BY MOUTH EACH EVENING 30 tablet 3  . traMADol (ULTRAM) 50 MG tablet ONE TAB PO BID PRN FOR PAIN AND HEADCAHES 20 tablet 2  . traZODone (DESYREL) 50 MG tablet Take 1 tablet (50 mg total) by mouth at bedtime. 30 tablet 1  . SUMAtriptan (IMITREX) 50 MG tablet Take 1 tablet (50 mg total) by mouth once for 1 dose. Take 1 tab at the onshet of headache with zofran 10 tablet 3   No current facility-administered medications for this visit.     Medical Decision Making:  Established Problem, Stable/Improving (1)  Treatment Plan Summary:Medication management   Post traumatic stress disorder- stable  Major depressive disorder, recurrent, moderate- will monitor  Insomnia- patient stopped trazodone last July, restart trazodone at 50mg  po qhs.  Patient is also on Valium prescribed by another doctor for pelvic muscle spasms.  Patient will return in  2 weeks. She has  been encouraged call with any questions or concerns prior to her next appointment.  Aryeh Butterfield 06/14/2018, 12:57 PM

## 2018-06-15 ENCOUNTER — Other Ambulatory Visit: Payer: Self-pay | Admitting: Internal Medicine

## 2018-07-05 ENCOUNTER — Other Ambulatory Visit: Payer: Self-pay

## 2018-07-05 ENCOUNTER — Ambulatory Visit (INDEPENDENT_AMBULATORY_CARE_PROVIDER_SITE_OTHER): Payer: Medicare Other | Admitting: Psychiatry

## 2018-07-05 ENCOUNTER — Encounter: Payer: Self-pay | Admitting: Psychiatry

## 2018-07-05 DIAGNOSIS — F331 Major depressive disorder, recurrent, moderate: Secondary | ICD-10-CM

## 2018-07-05 DIAGNOSIS — F79 Unspecified intellectual disabilities: Secondary | ICD-10-CM | POA: Diagnosis not present

## 2018-07-05 DIAGNOSIS — F4310 Post-traumatic stress disorder, unspecified: Secondary | ICD-10-CM | POA: Diagnosis not present

## 2018-07-05 NOTE — Progress Notes (Signed)
Patient ID: Teresa Crawford, female   DOB: 1986-01-18, 33 y.o.   MRN: [id number]  [facility] MD/PA/NP OP Progress Note  07/05/2018 10:57 AM [name]  MRN:  [id number]  Subjective:  Follow-up of her posttraumatic stress disorder and major depressive disorder, recurrent moderate. Today was a phone consult given the covid 19 situation. Patient reports she is sleeping better since starting the Trazodone. She continues to have low moods, which is situational due to her sisters health. She is feeling a bit cooped up at home, given the lockdown situation. However she does enjoy cooking, her boyfriend comes over and she is trying to exercise at home.   Chief Complaint:some stress  Visit Diagnosis:     ICD-10-CM   1. Major depressive disorder, recurrent episode, moderate (HCC) F33.1   2. Intellectual disability F79   3. Posttraumatic stress disorder F43.10     Past Medical History:  Past Medical History:  Diagnosis Date  . B12 deficiency anemia   . Chronic back pain   . Chronic fatigue syndrome   . Constipation   . Depression   . Elevated liver enzymes   . Endometriosis   . Fibroids   . Hepatic steatosis   . Hypothyroid   . Last menstrual period (LMP) > 10 days ago 09/12/15  . Mental retardation   . Migraine   . Post traumatic stress disorder   . Posttraumatic stress disorder   . PUD (peptic ulcer disease)   . Scoliosis   . Splenomegaly   . Uterine fibroid     Past Surgical History:  Procedure Laterality Date  . ABDOMINAL HYSTERECTOMY    . BACK SURGERY    . COLONOSCOPY  04/27/2006   Wohl-colitis, internal hemorrhoids  . COLONOSCOPY  02/2013   focal right colitis, left colon biopsy negative, focal active proctitis without chronicity  . ESOPHAGOGASTRODUODENOSCOPY  02/2013   multiple peptic & duodenal ulcers, negative h pylori  . INTRAUTERINE DEVICE INSERTION  2012  . LAPAROSCOPIC ENDOMETRIOSIS FULGURATION  2015   [name]  . OVARIAN CYST REMOVAL     Family History:  Family  History  Problem Relation Age of Onset  . Breast cancer Mother 64  . COPD Father   . Lung cancer Father 78  . Dementia Father   . Diabetes Sister   . Diabetes Brother   . Ovarian cancer Paternal Aunt   . Colon cancer Maternal Grandfather 23  . Liver disease Maternal Aunt        ?etiology  . Lymphoma Maternal Grandmother 60  . Lung cancer Other 46       maternal great grandfather  . Stomach cancer Other 12       maternal great grandmother   Social History:  Social History   Socioeconomic History  . Marital status: Single    Spouse name: Not on file  . Number of children: 0  . Years of education: Not on file  . Highest education level: Not on file  Occupational History  . Not on file  Social Needs  . Financial resource strain: Not hard at all  . Food insecurity:    Worry: Never true    Inability: Never true  . Transportation needs:    Medical: No    Non-medical: No  Tobacco Use  . Smoking status: Never Smoker  . Smokeless tobacco: Never Used  Substance and Sexual Activity  . Alcohol use: Not Currently    Alcohol/week: 0.0 standard drinks  . Drug use: No  .  Sexual activity: Not Currently  Lifestyle  . Physical activity:    Days per week: 0 days    Minutes per session: 0 min  . Stress: Not at all  Relationships  . Social connections:    Talks on phone: Patient refused    Gets together: Patient refused    Attends religious service: Patient refused    Active member of club or organization: Patient refused    Attends meetings of clubs or organizations: Patient refused    Relationship status: Patient refused  Other Topics Concern  . Not on file  Social History Narrative   She has brother, half brother & sister live nearby   Both parents deceased   Care managed by [name]   Additional History:   Assessment:   Musculoskeletal: Strength & Muscle Tone: within normal limits Gait & Station: normal Patient leans: N/A  Psychiatric Specialty  Exam: Medication Refill   Depression         Associated symptoms include does not have insomnia and no suicidal ideas.   Review of Systems  Endo/Heme/Allergies:       Patient reports that have an ultrasound of her thyroid.  Psychiatric/Behavioral: Negative for depression, hallucinations, memory loss, substance abuse and suicidal ideas. The patient is not nervous/anxious and does not have insomnia.   All other systems reviewed and are negative.   Last menstrual period 01/05/2016.There is no height or weight on file to calculate BMI.  General Appearance: Fairly Groomed  Eye Contact:  Fair  Speech:  Clear and Coherent and Normal Rate  Volume:  Normal  Mood:  Good  Affect:  Congruent   Thought Process:  Concrete  Orientation:  Full (Time, Place, and Person)  Thought Content:  Negative  Suicidal Thoughts:  No  Homicidal Thoughts:  No  Memory:  Immediate;   Good Recent;   Good Remote;   Good  Judgement:  Fair  Insight:  Fair  Psychomotor Activity:  Negative  Concentration:  Good  Recall:  Good  Fund of Knowledge: Fair  Language: Fair  Akathisia:  Negative  Handed:  Right unknown  AIMS (if indicated):  Not done  Assets:  Social Support  ADL's:  Intact  Cognition: Impaired,  Moderate  Sleep:  Good    Is the patient at risk to self?  No. Has the patient been a risk to self in the past 6 months?  No. Has the patient been a risk to self within the distant past?  No. Is the patient a risk to others?  No. Has the patient been a risk to others in the past 6 months?  No. Has the patient been a risk to others within the distant past?  No.  Current Medications: Current Outpatient Medications  Medication Sig Dispense Refill  . acetaminophen (TYLENOL) 500 MG tablet 2 TABS PO BID PRN FOR HEADACHES AND PAINS 45 tablet 3  . albuterol (PROAIR HFA) 108 (90 Base) MCG/ACT inhaler Inhale 2 puffs into the lungs every 6 (six) hours as needed for wheezing or shortness of breath. 1 Inhaler 3   . cetirizine (ZYRTEC) 10 MG tablet Take 1 tablet (10 mg total) by mouth daily. 30 tablet 3  . diazepam (VALIUM) 5 MG tablet INSERT INTRAVAGINAL QHS 30 tablet 2  . docusate sodium (COLACE) 100 MG capsule TAKE 1 CAPSULE BY MOUTH TWICE DAILY FOR STOOL SOFTENER 60 capsule 3  . EPINEPHrine 0.3 mg/0.3 mL IJ SOAJ injection USE FOR ANAPHYLAXIS 1 Device 5  . FLOVENT HFA  220 MCG/ACT inhaler INHALE 2 PUFFS INTO THE LUNGS, 2 TIMES PER DAY. RINSE MOUTH AFTER INHALATIONS 12 g 11  . fluticasone (FLONASE) 50 MCG/ACT nasal spray USE 1 SPRAY INTO EACH NOSTRIL ONCE DAILYFOR CONGESTION OR ALLERGIES 16 g 2  . ibuprofen (ADVIL,MOTRIN) 600 MG tablet ONE TAB PO BID PRN FOR PAIN AND HEADACHES 30 tablet 3  . omeprazole (PRILOSEC) 40 MG capsule TAKE 1 CAPSULE BY MOUTH TWICE DAILY 60 capsule 6  . SUMAtriptan (IMITREX) 50 MG tablet Take 1 tablet (50 mg total) by mouth once for 1 dose. Take 1 tab at the onshet of headache with zofran 10 tablet 3  . topiramate (TOPAMAX) 50 MG tablet TAKE 1 TABLET BY MOUTH EACH EVENING 30 tablet 3  . traMADol (ULTRAM) 50 MG tablet ONE TAB PO BID PRN FOR PAIN AND HEADCAHES 20 tablet 2  . traZODone (DESYREL) 50 MG tablet Take 1 tablet (50 mg total) by mouth at bedtime. 30 tablet 1   No current facility-administered medications for this visit.     Medical Decision Making:  Established Problem, Stable/Improving (1)  Treatment Plan Summary:Medication management   Post traumatic stress disorder- stable  Major depressive disorder, recurrent, moderate- will monitor  Insomnia- continue trazodone at 50mg  po qhs.  Patient is also on Valium prescribed by another doctor for pelvic muscle spasms.  Patient will return in  4 weeks. She has  been encouraged call with any questions or concerns prior to her next appointment.  Mishael Krysiak 07/05/2018, 10:57 AM

## 2018-07-30 ENCOUNTER — Other Ambulatory Visit: Payer: Self-pay | Admitting: Internal Medicine

## 2018-08-01 ENCOUNTER — Telehealth: Payer: Self-pay

## 2018-08-01 DIAGNOSIS — F5105 Insomnia due to other mental disorder: Secondary | ICD-10-CM

## 2018-08-01 NOTE — Telephone Encounter (Signed)
pharmacy called and because they use pill pack and the time frame of medication they need a rx for 22 pill of the trazodone. any question call pharmamcy

## 2018-08-02 MED ORDER — TRAZODONE HCL 50 MG PO TABS: 50.0000 mg | ORAL_TABLET | Freq: Every day | ORAL | 0 refills | Status: DC

## 2018-08-02 NOTE — Telephone Encounter (Signed)
Sent trazodone to pharmacy.

## 2018-08-16 ENCOUNTER — Other Ambulatory Visit: Payer: Self-pay

## 2018-08-16 ENCOUNTER — Ambulatory Visit (INDEPENDENT_AMBULATORY_CARE_PROVIDER_SITE_OTHER): Payer: Medicare Other | Admitting: Psychiatry

## 2018-08-16 ENCOUNTER — Encounter: Payer: Self-pay | Admitting: Psychiatry

## 2018-08-16 DIAGNOSIS — F3341 Major depressive disorder, recurrent, in partial remission: Secondary | ICD-10-CM | POA: Diagnosis not present

## 2018-08-16 DIAGNOSIS — F79 Unspecified intellectual disabilities: Secondary | ICD-10-CM | POA: Diagnosis not present

## 2018-08-16 DIAGNOSIS — F5105 Insomnia due to other mental disorder: Secondary | ICD-10-CM

## 2018-08-16 DIAGNOSIS — F4310 Post-traumatic stress disorder, unspecified: Secondary | ICD-10-CM

## 2018-08-16 MED ORDER — TRAZODONE HCL 50 MG PO TABS: 50.0000 mg | ORAL_TABLET | Freq: Every day | ORAL | 2 refills | Status: DC

## 2018-08-16 NOTE — Progress Notes (Signed)
Virtual Visit via Telephone Note  I connected with Teresa Crawford on 08/16/18 at  3:00 PM EDT by telephone and verified that I am speaking with the correct person using two identifiers.   I discussed the limitations, risks, security and privacy concerns of performing an evaluation and management service by telephone and the availability of in person appointments. I also discussed with the patient that there may be a patient responsible charge related to this service. The patient expressed understanding and agreed to proceed.    I discussed the assessment and treatment plan with the patient. The patient was provided an opportunity to ask questions and all were answered. The patient agreed with the plan and demonstrated an understanding of the instructions.   The patient was advised to call back or seek an in-person evaluation if the symptoms worsen or if the condition fails to improve as anticipated.  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  Patient was unable to clinic by video.  Patient today reports she is currently doing well.  She does have some anxiety symptoms about the COVID-19 situation.  She however continues to be able to cope with it.  She does have a therapist-Ms. Marjie Skiff which is helpful.  She used to go to day program at [facility] previously however due to the coronavirus she has not been able to do so.  She reports she does have life coaches who come and assist her during the day.  She reports sleep is good on the trazodone.  Patient denies any suicidality, homicidality or perceptual disturbances.  Patient denies any other concerns today.   Visit Diagnosis:   ICD-10-CM   1. MDD (major depressive disorder), recurrent, in partial remission ([place]) F33.41   2. Posttraumatic stress disorder F43.10   3. Insomnia due to mental condition F51.05 traZODone (DESYREL) 50 MG tablet  4. Intellectual disability F79    likely mild    Past Psychiatric History: Patient used to follow-up with Dr. Einar Grad here in clinic.  Previous diagnosis of MDD, intellectual disability, PTSD.  Past Medical History:  Past Medical History:  Diagnosis Date  . B12 deficiency anemia   . Chronic back pain   . Chronic fatigue syndrome   . Constipation   . Depression   . Elevated liver enzymes   . Endometriosis   . Fibroids   . Hepatic steatosis   . Hypothyroid   . Last menstrual period (LMP) > 10 days ago 09/12/15  . Mental retardation   . Migraine   . Post traumatic stress disorder   . Posttraumatic stress disorder   . PUD (peptic ulcer disease)   . Scoliosis   . Splenomegaly   . Uterine fibroid     Past Surgical History:  Procedure Laterality Date  . ABDOMINAL HYSTERECTOMY    . BACK SURGERY    . COLONOSCOPY  04/27/2006   Wohl-colitis, internal hemorrhoids  . COLONOSCOPY  02/2013   focal right colitis, left colon biopsy negative, focal active proctitis without chronicity  . ESOPHAGOGASTRODUODENOSCOPY  02/2013   multiple peptic & duodenal ulcers, negative h pylori  . INTRAUTERINE DEVICE INSERTION  2012  . LAPAROSCOPIC ENDOMETRIOSIS FULGURATION  2015   [name]  . OVARIAN CYST REMOVAL      Family Psychiatric History: Father-dementia  Family History:  Family History  Problem Relation Age of Onset  . Breast cancer Mother 42  . COPD Father   . Lung cancer Father 64  . Dementia Father   . Diabetes Sister   . Diabetes Brother   . Ovarian cancer Paternal Aunt   . Colon cancer Maternal Grandfather 84  . Liver disease Maternal Aunt        ?etiology  . Lymphoma Maternal Grandmother 60  . Lung cancer Other 2       maternal great grandfather  . Stomach cancer Other  83       maternal great grandmother    Social History: Patient was born in [place]. Mother raised her. Went up to 11 th grade, was in special classes. Is in a relationship. Never been married, Has a sister and two brothers. History of trauma.Used to go to [employer] day program previously prior to [employer] 19.She is on SSD. Social History   Socioeconomic History  . Marital status: Single    Spouse name: Not on file  . Number of children: 0  . Years of education: Not on file  . Highest education level: Not on file  Occupational History  . Not on file  Social Needs  . Financial resource strain: Not hard at all  . Food insecurity:    Worry: Never true    Inability: Never true  . Transportation needs:    Medical: No    Non-medical: No  Tobacco Use  . Smoking status: Never Smoker  . Smokeless tobacco: Never Used  Substance and Sexual Activity  . Alcohol use: Not Currently    Alcohol/week: 0.0 standard drinks  . Drug use: No  . Sexual activity: Not Currently  Lifestyle  . Physical activity:    Days per week: 0 days    Minutes per session: 0 min  . Stress: Not at all  Relationships  . Social connections:    Talks on phone: Patient refused    Gets together: Patient refused    Attends religious service: Patient refused    Active member of club or organization: Patient refused    Attends meetings of clubs or organizations: Patient refused    Relationship status: Patient refused  Other Topics Concern  . Not on file  Social History Narrative   She has brother, half brother & sister live nearby   Both parents deceased   Care managed by [name]    Allergies:  Allergies  Allergen Reactions  . Other Anaphylaxis    Peanut butter, uses Epi pen  . Peanut Butter Flavor Swelling    Throat swells Throat swells  . Sertraline Other (See Comments)    Panic attacks  . Codeine Nausea And Vomiting    nausea  . Paroxetine Other (See Comments)    Panic Attack   .  Paroxetine Hcl Other (See Comments)    Panic attack Other reaction(s): Other (See Comments) Panic attacks  . Venlafaxine Other (See Comments)    Panic Attack   . Zoloft [Sertraline Hcl] Other (See Comments)    Panic attack    Metabolic Disorder Labs: No results found for: HGBA1C, MPG No results found for: PROLACTIN No results found for: CHOL, TRIG, HDL, CHOLHDL, VLDL, LDLCALC Lab Results  Component Value Date   TSH 4.710 (H) 12/28/2016   TSH 1.630 09/21/2014    Therapeutic Level Labs:  No results found for: LITHIUM No results found for: VALPROATE No components found for:  CBMZ  Current Medications: Current Outpatient Medications  Medication Sig Dispense Refill  . acetaminophen (TYLENOL) 500 MG tablet 2 TABS PO BID PRN FOR HEADACHES AND PAINS 45 tablet 3  . albuterol (PROAIR HFA) 108 (90 Base) MCG/ACT inhaler Inhale 2 puffs into the lungs every 6 (six) hours as needed for wheezing or shortness of breath. 1 Inhaler 3  . cetirizine (ZYRTEC) 10 MG tablet Take 1 tablet (10 mg total) by mouth daily. 30 tablet 3  . diazepam (VALIUM) 5 MG tablet INSERT INTRAVAGINAL QHS 30 tablet 2  . docusate sodium (COLACE) 100 MG capsule TAKE 1 CAPSULE BY MOUTH TWICE DAILY FOR STOOL SOFTENER 60 capsule 3  . EPINEPHrine 0.3 mg/0.3 mL IJ SOAJ injection USE FOR ANAPHYLAXIS 1 Device 5  . FLOVENT HFA 220 MCG/ACT inhaler INHALE 2 PUFFS INTO THE LUNGS, 2 TIMES PER DAY. RINSE MOUTH AFTER INHALATIONS 12 g 11  . fluticasone (FLONASE) 50 MCG/ACT nasal spray USE 1 SPRAY INTO EACH NOSTRIL ONCE DAILYFOR CONGESTION OR ALLERGIES 16 g 2  . ibuprofen (ADVIL,MOTRIN) 600 MG tablet ONE TAB PO BID PRN FOR PAIN AND HEADACHES 30 tablet 3  . omeprazole (PRILOSEC) 40 MG capsule TAKE 1 CAPSULE BY MOUTH TWICE DAILY 60 capsule 6  . SUMAtriptan (IMITREX) 50 MG tablet Take 1 tablet (50 mg total) by mouth once for 1 dose. Take 1 tab at the onshet of headache with zofran 10 tablet 3  . topiramate (TOPAMAX) 50 MG tablet TAKE 1 TABLET  BY MOUTH EACH EVENING 30 tablet 3  . traMADol (ULTRAM) 50 MG tablet ONE TAB PO BID PRN FOR PAIN AND HEADCAHES 20 tablet 2  . traZODone (DESYREL) 50 MG tablet Take 1 tablet (50 mg total) by mouth at bedtime. 30 tablet 2   No current facility-administered medications for this visit.      Musculoskeletal: Strength & Muscle Tone: UTA Gait & Station: UTA Patient leans: N/A  Psychiatric Specialty Exam: Review of Systems  Psychiatric/Behavioral: The patient is nervous/anxious.   All other systems reviewed and are negative.   Last menstrual period 01/05/2016.There is no height or weight on file to calculate BMI.  General Appearance: UTA  Eye Contact:  UTA  Speech:  Normal Rate  Volume:  Normal  Mood:  Anxious  Affect:  UTA  Thought Process:  Goal Directed and Descriptions of Associations: Intact  Orientation:  Full (Time, Place, and Person)  Thought Content: Logical   Suicidal Thoughts:  No  Homicidal Thoughts:  No  Memory:  Immediate;   Fair Recent;   Fair Remote;   Fair  Judgement:  Fair  Insight:  Fair  Psychomotor Activity:  UTA  Concentration:  Concentration: Fair and Attention Span: Fair  Recall:  [employer] of Knowledge: Fair  Language: Fair  Akathisia:  No  Handed:  Right  AIMS (if indicated): Denies tremor, rigidity, stiffness  Assets:  Communication Skills Desire for Improvement Housing  ADL's:  Intact  Cognition: WNL  Sleep:  Fair   Screenings: Mini-Mental     Office Visit from 10/13/2017 in [facility], [facility]  Total Score (max 30 points )  27    PHQ2-9     Office Visit from 05/31/2018 in [facility], [facility] Office Visit from 01/11/2018 in [facility] Office Visit from 10/13/2017 in [facility], [facility] Office Visit from 08/17/2017 in [facility], [facility] Office Visit from 05/19/2017  in [facility], [facility]  PHQ-2 Total Score  0  0  0  0  0       Assessment and Plan: Teresa Crawford is a 33 year old  CF, who is single , on SSD, has a history of MDD, PTSD, insomnia was evaluated by phone today. Patient is currently doing well on the current medications. Plan as noted below.  Plan MDD in remission Will monitor self.  PTSD - stable Continue CBT with Marjie Skiff.  For Insomnia- stable Trazodone 50 mg po qhs.  I have reviewed medical records per Dr.Ravi - most recent notes dates 07/05/2018 - " Pt with PTSD, MDD - continued on trazodone 50 mg."  Follow up in clinic in 3 months or sooner if needed. Appointment scheduled for July 2  , 2:30 pm  I have spent atleast 15 minutes non  face to face with patient today. More than 50 % of the time was spent for psychoeducation and supportive psychotherapy and care coordination.  This note was generated in part or whole with voice recognition software. Voice recognition is usually quite accurate but there are transcription errors that can and very often do occur. I apologize for any typographical errors that were not detected and corrected.        Ursula Alert, MD 08/16/2018, 5:05 PM

## 2018-08-23 ENCOUNTER — Ambulatory Visit: Payer: Self-pay | Admitting: Internal Medicine

## 2018-08-30 ENCOUNTER — Other Ambulatory Visit: Payer: Self-pay | Admitting: Internal Medicine

## 2018-08-30 ENCOUNTER — Encounter: Payer: Self-pay | Admitting: Internal Medicine

## 2018-08-30 ENCOUNTER — Other Ambulatory Visit: Payer: Self-pay

## 2018-08-30 ENCOUNTER — Ambulatory Visit (INDEPENDENT_AMBULATORY_CARE_PROVIDER_SITE_OTHER): Payer: Medicare Other | Admitting: Internal Medicine

## 2018-08-30 DIAGNOSIS — F4310 Post-traumatic stress disorder, unspecified: Secondary | ICD-10-CM

## 2018-08-30 DIAGNOSIS — E6609 Other obesity due to excess calories: Secondary | ICD-10-CM | POA: Diagnosis not present

## 2018-08-30 DIAGNOSIS — J301 Allergic rhinitis due to pollen: Secondary | ICD-10-CM | POA: Diagnosis not present

## 2018-08-30 DIAGNOSIS — F3342 Major depressive disorder, recurrent, in full remission: Secondary | ICD-10-CM

## 2018-08-30 DIAGNOSIS — G43109 Migraine with aura, not intractable, without status migrainosus: Secondary | ICD-10-CM

## 2018-08-30 NOTE — Progress Notes (Signed)
[facility], [place] [address]  Internal MEDICINE  Telephone Visit  Patient Name: Teresa Crawford  [id number]  [id number]  Date of Service: 08/30/2018  I connected with the patient at 952 by telephone and verified the patients identity using two identifiers.   I discussed the limitations, risks, security and privacy concerns of performing an evaluation and management service by telephone and the availability of in person appointments. I also discussed with the patient that there may be a patient responsible charge related to the service.  The patient expressed understanding and agrees to proceed.    Chief Complaint  Patient presents with  . Telephone Assessment  . Telephone Screen  . Depression  . Anxiety  . Quality Metric Gaps    pap    HPI Pt is connected via Web- Cam due to risk of covid 19. Denies any major complaints, she has been trying to walk, allergies are under good control    Current Medication: Outpatient Encounter Medications as of 08/30/2018  Medication Sig  . acetaminophen (TYLENOL) 500 MG tablet 2 TABS PO BID PRN FOR HEADACHES AND PAINS  . albuterol (PROAIR HFA) 108 (90 Base) MCG/ACT inhaler Inhale 2 puffs into the lungs every 6 (six) hours as needed for wheezing or shortness of breath.  . cetirizine (ZYRTEC) 10 MG tablet Take 1 tablet (10 mg total) by mouth daily.  . diazepam (VALIUM) 5 MG tablet INSERT INTRAVAGINAL QHS  . docusate sodium (COLACE) 100 MG capsule TAKE 1 CAPSULE BY MOUTH TWICE DAILY FOR STOOL SOFTENER  . EPINEPHrine 0.3 mg/0.3 mL IJ SOAJ injection USE FOR ANAPHYLAXIS  . FLOVENT HFA 220 MCG/ACT inhaler INHALE 2 PUFFS INTO THE LUNGS, 2 TIMES PER DAY. RINSE MOUTH AFTER INHALATIONS  . fluticasone (FLONASE) 50 MCG/ACT nasal spray USE 1 SPRAY INTO EACH NOSTRIL ONCE DAILYFOR CONGESTION OR ALLERGIES  . ibuprofen (ADVIL,MOTRIN) 600 MG tablet ONE TAB PO BID PRN FOR PAIN AND HEADACHES  . omeprazole (PRILOSEC) 40 MG capsule  TAKE 1 CAPSULE BY MOUTH TWICE DAILY  . topiramate (TOPAMAX) 50 MG tablet TAKE 1 TABLET BY MOUTH EACH EVENING  . traMADol (ULTRAM) 50 MG tablet ONE TAB PO BID PRN FOR PAIN AND HEADCAHES  . traZODone (DESYREL) 50 MG tablet Take 1 tablet (50 mg total) by mouth at bedtime.  . SUMAtriptan (IMITREX) 50 MG tablet Take 1 tablet (50 mg total) by mouth once for 1 dose. Take 1 tab at the onshet of headache with zofran   No facility-administered encounter medications on file as of 08/30/2018.     Surgical History: Past Surgical History:  Procedure Laterality Date  . ABDOMINAL HYSTERECTOMY    . BACK SURGERY    . COLONOSCOPY  04/27/2006   Wohl-colitis, internal hemorrhoids  . COLONOSCOPY  02/2013   focal right colitis, left colon biopsy negative, focal active proctitis without chronicity  . ESOPHAGOGASTRODUODENOSCOPY  02/2013   multiple peptic & duodenal ulcers, negative h pylori  . INTRAUTERINE DEVICE INSERTION  2012  . LAPAROSCOPIC ENDOMETRIOSIS FULGURATION  2015   [name]  . OVARIAN CYST REMOVAL      Medical History: Past Medical History:  Diagnosis Date  . B12 deficiency anemia   . Chronic back pain   . Chronic fatigue syndrome   . Constipation   . Depression   . Elevated liver enzymes   . Endometriosis   . Fibroids   . Hepatic steatosis   . Hypothyroid   . Last menstrual period (LMP) > 10 days  ago 09/12/15  . Mental retardation   . Migraine   . Post traumatic stress disorder   . Posttraumatic stress disorder   . PUD (peptic ulcer disease)   . Scoliosis   . Splenomegaly   . Uterine fibroid     Family History: Family History  Problem Relation Age of Onset  . Breast cancer Mother 65  . COPD Father   . Lung cancer Father 53  . Dementia Father   . Diabetes Sister   . Diabetes Brother   . Ovarian cancer Paternal Aunt   . Colon cancer Maternal Grandfather 82  . Liver disease Maternal Aunt        ?etiology  . Lymphoma Maternal Grandmother 60  . Lung cancer Other 31        maternal great grandfather  . Stomach cancer Other 29       maternal great grandmother    Social History   Socioeconomic History  . Marital status: Single    Spouse name: Not on file  . Number of children: 0  . Years of education: Not on file  . Highest education level: Not on file  Occupational History  . Not on file  Social Needs  . Financial resource strain: Not hard at all  . Food insecurity:    Worry: Never true    Inability: Never true  . Transportation needs:    Medical: No    Non-medical: No  Tobacco Use  . Smoking status: Never Smoker  . Smokeless tobacco: Never Used  Substance and Sexual Activity  . Alcohol use: Not Currently    Alcohol/week: 0.0 standard drinks  . Drug use: No  . Sexual activity: Not Currently  Lifestyle  . Physical activity:    Days per week: 0 days    Minutes per session: 0 min  . Stress: Not at all  Relationships  . Social connections:    Talks on phone: Patient refused    Gets together: Patient refused    Attends religious service: Patient refused    Active member of club or organization: Patient refused    Attends meetings of clubs or organizations: Patient refused    Relationship status: Patient refused  . Intimate partner violence:    Fear of current or ex partner: Patient refused    Emotionally abused: Patient refused    Physically abused: Patient refused    Forced sexual activity: Patient refused  Other Topics Concern  . Not on file  Social History Narrative   She has brother, half brother & sister live nearby   Both parents deceased   Care managed by [name]      Review of Systems  Constitutional: Negative.   HENT: Negative.   Eyes: Negative.   Respiratory: Negative.   Gastrointestinal: Negative.   Genitourinary: Negative.   Musculoskeletal: Negative.   Psychiatric/Behavioral: Negative.     Vital Signs: LMP 01/05/2016   Observation/Objective: Pt  Seems to be OK, able to carry out conversation.    Assessment/Plan: 1. Allergic rhinitis due to pollen, unspecified seasonality Controlled   2. Recurrent major depressive disorder, in full remission ([place]) Stable, continues to have support from [employer]   3. Other obesity due to excess calories Encouraged walking   4. Migraine aura without headache Controlled and educated to avoid triggers   5. PTSD (post-traumatic stress disorder) Continue Diazepam for insomnia   General Counseling: Pressley verbalizes understanding of the findings of today's phone visit and agrees with plan of  treatment. I have discussed any further diagnostic evaluation that may be needed or ordered today. We also reviewed her medications today. she has been encouraged to call the office with any questions or concerns that should arise related to todays visit.    Time spent:15 Minutes    Dr Lavera Guise Internal medicine

## 2018-09-05 ENCOUNTER — Other Ambulatory Visit: Payer: Self-pay | Admitting: Adult Health

## 2018-09-05 DIAGNOSIS — F331 Major depressive disorder, recurrent, moderate: Secondary | ICD-10-CM

## 2018-09-05 DIAGNOSIS — F4310 Post-traumatic stress disorder, unspecified: Secondary | ICD-10-CM

## 2018-09-05 NOTE — Telephone Encounter (Signed)
Last ov 08/30/18 next 12/06/18

## 2018-10-04 ENCOUNTER — Other Ambulatory Visit: Payer: Self-pay | Admitting: Internal Medicine

## 2018-10-06 ENCOUNTER — Encounter: Payer: Self-pay | Admitting: Psychiatry

## 2018-10-06 ENCOUNTER — Other Ambulatory Visit: Payer: Self-pay

## 2018-10-06 ENCOUNTER — Ambulatory Visit (INDEPENDENT_AMBULATORY_CARE_PROVIDER_SITE_OTHER): Payer: Medicare Other | Admitting: Psychiatry

## 2018-10-06 DIAGNOSIS — F5105 Insomnia due to other mental disorder: Secondary | ICD-10-CM

## 2018-10-06 DIAGNOSIS — F79 Unspecified intellectual disabilities: Secondary | ICD-10-CM | POA: Diagnosis not present

## 2018-10-06 DIAGNOSIS — F3341 Major depressive disorder, recurrent, in partial remission: Secondary | ICD-10-CM | POA: Insufficient documentation

## 2018-10-06 DIAGNOSIS — F4310 Post-traumatic stress disorder, unspecified: Secondary | ICD-10-CM

## 2018-10-06 MED ORDER — TRAZODONE HCL 50 MG PO TABS: 50.0000 mg | ORAL_TABLET | Freq: Every day | ORAL | 4 refills | Status: DC

## 2018-10-06 NOTE — Progress Notes (Signed)
Virtual Visit via Video Note  I connected with Teresa Crawford on 10/06/18 at  2:30 PM EDT by a video enabled telemedicine application and verified that I am speaking with the correct person using two identifiers.   I discussed the limitations of evaluation and management by telemedicine and the availability of in person appointments. The patient expressed understanding and agreed to proceed.   I discussed the assessment and treatment plan with the patient. The patient was provided an opportunity to ask questions and all were answered. The patient agreed with the plan and demonstrated an understanding of the instructions.   The patient was advised to call back or seek an in-person evaluation if the symptoms worsen or if the condition fails to improve as anticipated.   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  Patient today reports it is her birthday and she is having a party with a few friends.  She lives in an independent living, and has support staff available.  She reports she and her friends are currently trying to stay safe from the COVID-19.  She reports her sister and her brother called her to wish her today.  She reports she is currently doing well on the trazodone.  She denies any significant sadness or crying spells.  She denies any suicidality, homicidality or perceptual disturbances.  Patient continues to touch base with her therapist-Ms. Marjie Skiff.  She does not go to the day program anymore due to the coronavirus precautions.  Patient denies any other concerns today.   Visit Diagnosis:    ICD-10-CM   1. MDD (major depressive disorder), recurrent, in partial  remission ([place])  F33.41   2. Posttraumatic stress disorder  F43.10   3. Insomnia due to mental condition  F51.05 traZODone (DESYREL) 50 MG tablet  4. Intellectual disability  F79     Past Psychiatric History: I have reviewed past psychiatric history from my progress note on 08/16/2018.  Past Medical History:  Past Medical History:  Diagnosis Date  . B12 deficiency anemia   . Chronic back pain   . Chronic fatigue syndrome   . Constipation   . Depression   . Elevated liver enzymes   . Endometriosis   . Fibroids   . Hepatic steatosis   . Hypothyroid   . Last menstrual period (LMP) > 10 days ago 09/12/15  . Mental retardation   . Migraine   . Post traumatic stress disorder   . Posttraumatic stress disorder   . PUD (peptic ulcer disease)   . Scoliosis   . Splenomegaly   . Uterine fibroid     Past Surgical History:  Procedure Laterality Date  . ABDOMINAL HYSTERECTOMY    . BACK SURGERY    . COLONOSCOPY  04/27/2006   Wohl-colitis, internal hemorrhoids  . COLONOSCOPY  02/2013   focal right colitis, left colon biopsy negative, focal active proctitis without chronicity  . ESOPHAGOGASTRODUODENOSCOPY  02/2013   multiple peptic & duodenal ulcers, negative h pylori  . INTRAUTERINE DEVICE INSERTION  2012  . LAPAROSCOPIC ENDOMETRIOSIS FULGURATION  2015   [name]  . OVARIAN CYST REMOVAL      Family Psychiatric History: I have reviewed family  psychiatric history from my progress note on 08/16/2018.  Family History:  Family History  Problem Relation Age of Onset  . Breast cancer Mother 56  . COPD Father   . Lung cancer Father 82  . Dementia Father   . Diabetes Sister   . Diabetes Brother   . Ovarian cancer Paternal Aunt   . Colon cancer Maternal Grandfather 106  . Liver disease Maternal Aunt        ?etiology  . Lymphoma Maternal Grandmother 60  . Lung cancer Other 110       maternal great grandfather  . Stomach cancer Other 65       maternal great grandmother    Social  History:  Social History   Socioeconomic History  . Marital status: Single    Spouse name: Not on file  . Number of children: 0  . Years of education: Not on file  . Highest education level: Not on file  Occupational History  . Not on file  Social Needs  . Financial resource strain: Not hard at all  . Food insecurity    Worry: Never true    Inability: Never true  . Transportation needs    Medical: No    Non-medical: No  Tobacco Use  . Smoking status: Never Smoker  . Smokeless tobacco: Never Used  Substance and Sexual Activity  . Alcohol use: Not Currently    Alcohol/week: 0.0 standard drinks  . Drug use: No  . Sexual activity: Not Currently  Lifestyle  . Physical activity    Days per week: 0 days    Minutes per session: 0 min  . Stress: Not at all  Relationships  . Social Herbalist on phone: Patient refused    Gets together: Patient refused    Attends religious service: Patient refused    Active member of club or organization: Patient refused    Attends meetings of clubs or organizations: Patient refused    Relationship status: Patient refused  Other Topics Concern  . Not on file  Social History Narrative   She has brother, half brother & sister live nearby   Both parents deceased   Care managed by [name]    Allergies:  Allergies  Allergen Reactions  . Other Anaphylaxis    Peanut butter, uses Epi pen  . Peanut Butter Flavor Swelling    Throat swells Throat swells  . Sertraline Other (See Comments)    Panic attacks  . Codeine Nausea And Vomiting    nausea  . Paroxetine Other (See Comments)    Panic Attack   . Paroxetine Hcl Other (See Comments)    Panic attack Other reaction(s): Other (See Comments) Panic attacks  . Venlafaxine Other (See Comments)    Panic Attack   . Zoloft [Sertraline Hcl] Other (See Comments)    Panic attack    Metabolic Disorder Labs: No results found for: HGBA1C, MPG No results found for: PROLACTIN No  results found for: CHOL, TRIG, HDL, CHOLHDL, VLDL, LDLCALC Lab Results  Component Value Date   TSH 4.710 (H) 12/28/2016   TSH 1.630 09/21/2014    Therapeutic Level Labs: No results found for: LITHIUM No results found for: VALPROATE No components found for:  CBMZ  Current Medications: Current Outpatient Medications  Medication Sig Dispense Refill  . acetaminophen (TYLENOL) 500 MG tablet 2 TABS PO BID PRN FOR HEADACHES AND PAINS 45 tablet 3  . albuterol (PROAIR HFA) 108 (90 Base) MCG/ACT inhaler Inhale 2  puffs into the lungs every 6 (six) hours as needed for wheezing or shortness of breath. 1 Inhaler 3  . cetirizine (ZYRTEC) 10 MG tablet TAKE 1 TABLET BY MOUTH AT BEDTIME FOR ALLERGIES 30 tablet 3  . diazepam (VALIUM) 5 MG tablet INSERT 1 TABLET VAGINALLY ONCE DAILY AT BEDTIME 30 tablet 3  . docusate sodium (COLACE) 100 MG capsule TAKE 1 CAPSULE BY MOUTH TWICE DAILY FOR STOOL SOFTENER 60 capsule 3  . EPINEPHrine 0.3 mg/0.3 mL IJ SOAJ injection USE FOR ANAPHYLAXIS 1 Device 5  . FLOVENT HFA 220 MCG/ACT inhaler INHALE 2 PUFFS INTO THE LUNGS, 2 TIMES PER DAY. RINSE MOUTH AFTER INHALATIONS 12 g 11  . fluticasone (FLONASE) 50 MCG/ACT nasal spray USE 1 SPRAY INTO EACH NOSTRIL ONCE DAILYFOR CONGESTION OR ALLERGIES 16 g 2  . ibuprofen (ADVIL,MOTRIN) 600 MG tablet ONE TAB PO BID PRN FOR PAIN AND HEADACHES 30 tablet 3  . omeprazole (PRILOSEC) 40 MG capsule TAKE 1 CAPSULE BY MOUTH TWICE DAILY 60 capsule 6  . SUMAtriptan (IMITREX) 50 MG tablet Take 1 tablet (50 mg total) by mouth once for 1 dose. Take 1 tab at the onshet of headache with zofran 10 tablet 3  . topiramate (TOPAMAX) 50 MG tablet TAKE 1 TABLET BY MOUTH ONCE DAILY IN THEEVENING 30 tablet 3  . traMADol (ULTRAM) 50 MG tablet ONE TAB PO BID PRN FOR PAIN AND HEADCAHES 20 tablet 2  . traZODone (DESYREL) 50 MG tablet Take 1 tablet (50 mg total) by mouth at bedtime. 30 tablet 4   No current facility-administered medications for this visit.       Musculoskeletal: Strength & Muscle Tone: within normal limits Gait & Station: observed as sitting Patient leans: N/A  Psychiatric Specialty Exam: Review of Systems  Psychiatric/Behavioral: Negative for depression. The patient is not nervous/anxious.   All other systems reviewed and are negative.   Last menstrual period 01/05/2016.There is no height or weight on file to calculate BMI.  General Appearance: Casual  Eye Contact:  Fair  Speech:  Clear and Coherent  Volume:  Normal  Mood:  Euthymic  Affect:  Congruent  Thought Process:  Goal Directed and Descriptions of Associations: Intact  Orientation:  Full (Time, Place, and Person)  Thought Content: Logical   Suicidal Thoughts:  No  Homicidal Thoughts:  No  Memory:  Immediate;   Fair Recent;   Fair Remote;   Fair  Judgement:  Fair  Insight:  Fair  Psychomotor Activity:  Normal  Concentration:  Concentration: Fair and Attention Span: Fair  Recall:  [employer] of Knowledge: Fair  Language: Fair  Akathisia:  No  Handed:  Right  AIMS (if indicated): denies tremors, rigidity  Assets:  Communication Skills Desire for Improvement Housing Social Support  ADL's:  Intact  Cognition: WNL  Sleep:  Fair   Screenings: Mini-Mental     Office Visit from 10/13/2017 in [facility], [facility]  Total Score (max 30 points )  27    PHQ2-9     Office Visit from 08/30/2018 in [facility], [facility] Office Visit from 05/31/2018 in [facility], [facility] Office Visit from 01/11/2018 in [facility], [facility] Office Visit from 10/13/2017 in [facility], [facility] Office Visit from 08/17/2017 in [facility], [facility]  PHQ-2 Total Score  0  0  0  0  0       Assessment and Plan: Teresa Crawford is a 33 year old Caucasian female who is single, on SSD, has a  history of MDD, PTSD, insomnia was evaluated by telemedicine today.  She is currently doing well on the current medication regimen.  Plan as noted  below.  Plan MDD in remission Will continue to monitor.  For PTSD-stable Continue CBT with Marjie Skiff.  For insomnia-stable Trazodone 50 mg p.o. nightly  Follow-up in clinic in 3 to 4 months or sooner if needed.  October 30 at 10 AM  I have spent atleast 15 minutes non face to face with patient today. More than 50 % of the time was spent for psychoeducation and supportive psychotherapy and care coordination.  This note was generated in part or whole with voice recognition software. Voice recognition is usually quite accurate but there are transcription errors that can and very often do occur. I apologize for any typographical errors that were not detected and corrected.       Ursula Alert, MD 10/06/2018, 6:12 PM

## 2018-10-18 ENCOUNTER — Ambulatory Visit: Payer: Self-pay | Admitting: Nurse Practitioner

## 2018-11-27 IMAGING — CT CT ANGIO CHEST
1 of 12 series · 10 of 46 positions shown · IV contrast (APPLIED)
Comparison: Chest radiograph [DATE], [id number]

CLINICAL DATA: Chest and rib pain, LEFT chest wall pain replicates
on palpation. Elevated D-dimer.

EXAM:
CT ANGIOGRAPHY CHEST WITH CONTRAST
TECHNIQUE: Multidetector CT imaging of the chest was performed using the
standard protocol during bolus administration of intravenous
contrast. Multiplanar CT image reconstructions and MIPs were
obtained to evaluate the vascular anatomy. Please note, initial
bolus resultant and poor pulmonary arterial opacification, repeat
examination performed.
CONTRAST:  75 cc Isovue 370

[Series 8: thins · axial · 0.72mm/px · z∈[+82,+307]mm · 10 of 277 slices shown]
[im 26/277  lung]
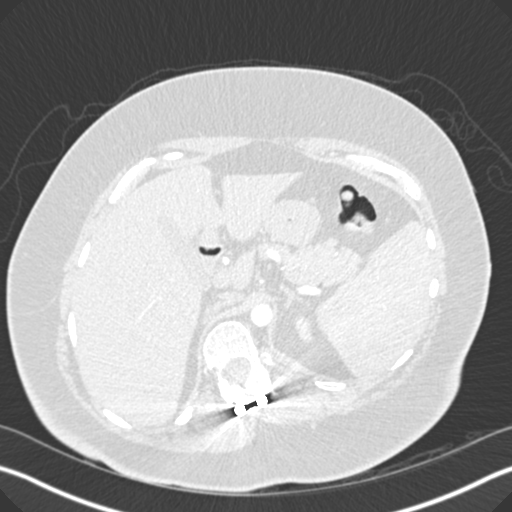
[im 51/277  soft-tissue]
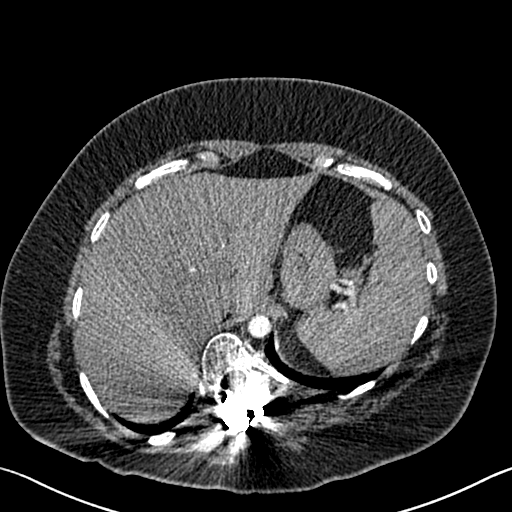
[im 76/277  lung]
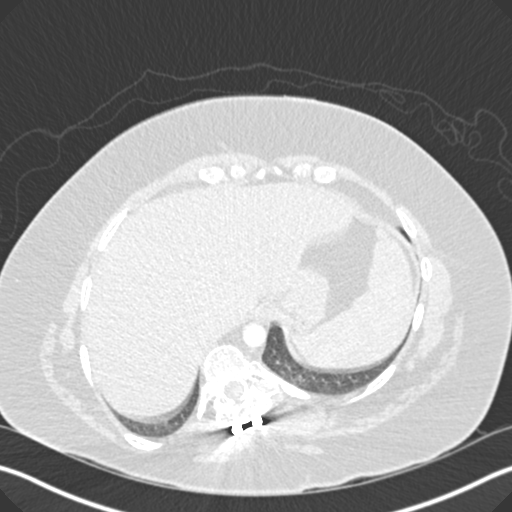
[im 101/277  soft-tissue]
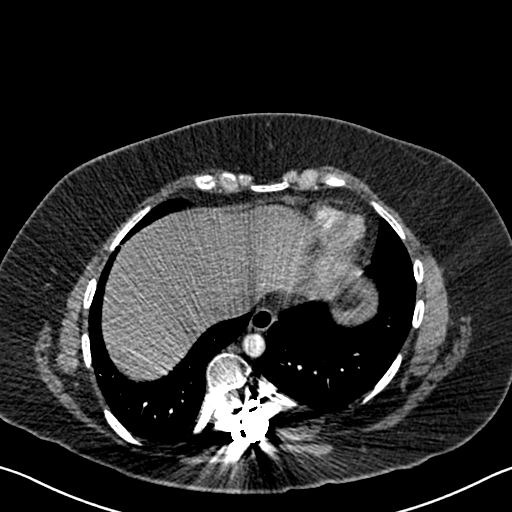
[im 126/277  lung]
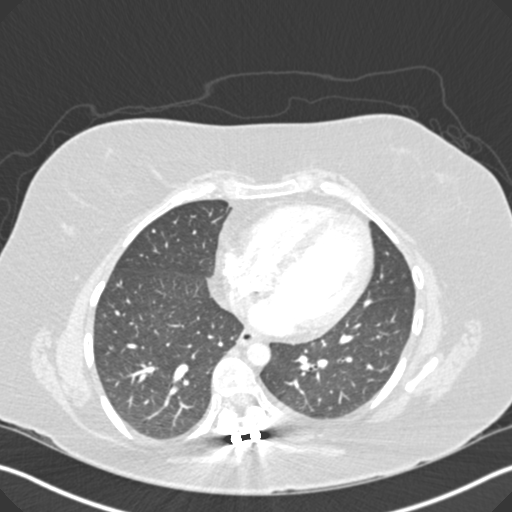
[im 151/277  soft-tissue]
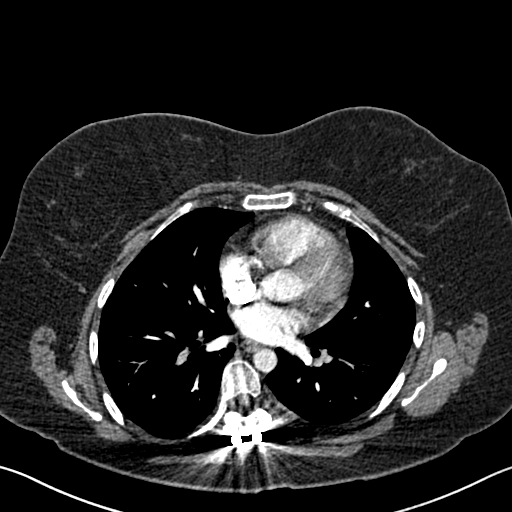
[im 176/277  lung]
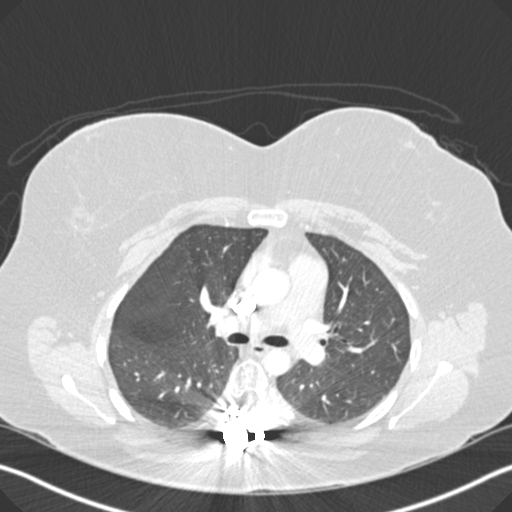
[im 201/277  soft-tissue]
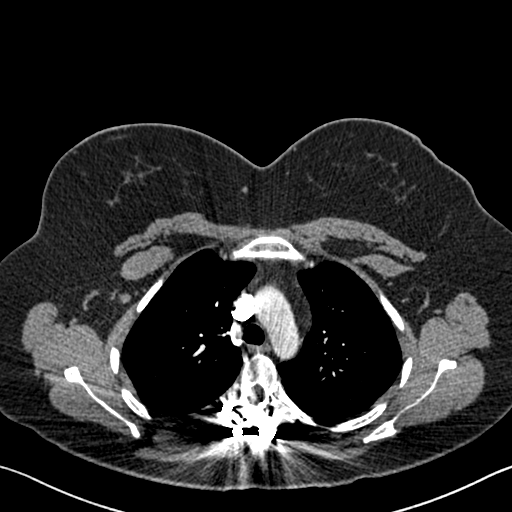
[im 226/277  lung]
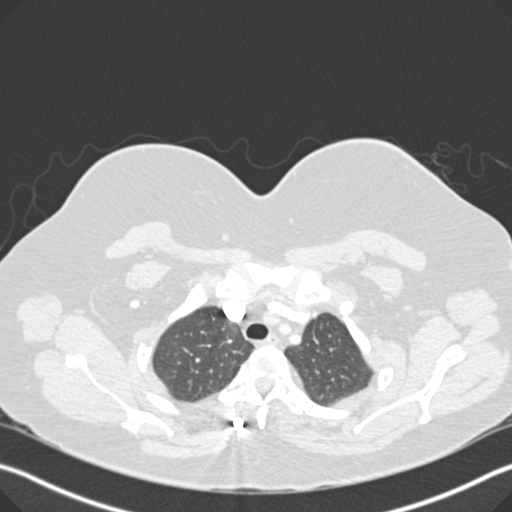
[im 251/277  soft-tissue]
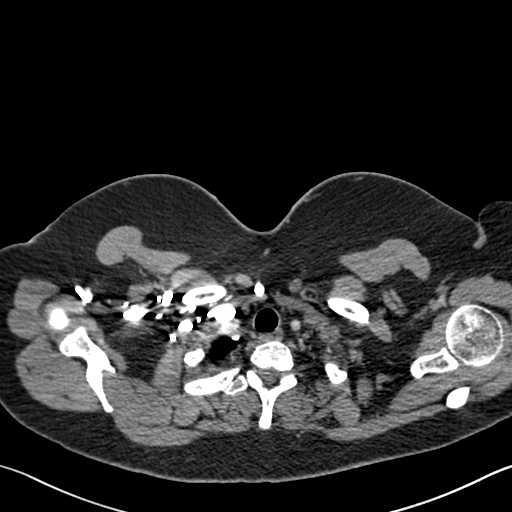

[10 of 46 positions shown; findings below may reference images not displayed]

FINDINGS: CARDIOVASCULAR: Adequate contrast opacification of the pulmonary
artery's. Main pulmonary artery is not enlarged. No pulmonary
arterial filling defects to the level of the subsegmental branches.
Heart size is normal, no right heart strain. No pericardial
effusion. Thoracic aorta is normal course and caliber, unremarkable.

MEDIASTINUM/NODES: No lymphadenopathy by CT size criteria.

LUNGS/PLEURA: Tracheobronchial tree is patent, no pneumothorax. No
pleural effusions, focal consolidations, pulmonary nodules or
masses.

UPPER ABDOMEN: Included abdomen demonstrates hepatic steatosis.
Borderline included splenomegaly.

MUSCULOSKELETAL: Visualized soft tissues and included osseous
structures are nonacute. Broad dextroscoliosis with thoracolumbar
Harrington rods.

Review of the MIP images confirms the above findings.
IMPRESSION: Negative CT angiogram of the chest.

## 2018-11-29 ENCOUNTER — Other Ambulatory Visit: Payer: Self-pay | Admitting: Internal Medicine

## 2018-11-29 DIAGNOSIS — F331 Major depressive disorder, recurrent, moderate: Secondary | ICD-10-CM

## 2018-11-29 DIAGNOSIS — F4310 Post-traumatic stress disorder, unspecified: Secondary | ICD-10-CM

## 2018-11-30 NOTE — Telephone Encounter (Signed)
Patient needs to keep follow up appointment in 12/2018

## 2018-11-30 NOTE — Telephone Encounter (Signed)
Pt had appt 12/07/18 with you can send until then for diazepam

## 2018-12-06 ENCOUNTER — Ambulatory Visit: Payer: Self-pay | Admitting: Nurse Practitioner

## 2018-12-07 ENCOUNTER — Encounter: Payer: Self-pay | Admitting: Nurse Practitioner

## 2018-12-07 ENCOUNTER — Other Ambulatory Visit: Payer: Self-pay

## 2018-12-07 ENCOUNTER — Ambulatory Visit (INDEPENDENT_AMBULATORY_CARE_PROVIDER_SITE_OTHER): Payer: Medicare Other | Admitting: Nurse Practitioner

## 2018-12-07 VITALS — BP 110/82 | HR 82 | Resp 16 | Ht 64.0 in | Wt 205.0 lb

## 2018-12-07 DIAGNOSIS — N390 Urinary tract infection, site not specified: Secondary | ICD-10-CM | POA: Diagnosis not present

## 2018-12-07 DIAGNOSIS — R319 Hematuria, unspecified: Secondary | ICD-10-CM | POA: Diagnosis not present

## 2018-12-07 DIAGNOSIS — Z0001 Encounter for general adult medical examination with abnormal findings: Secondary | ICD-10-CM | POA: Diagnosis not present

## 2018-12-07 DIAGNOSIS — E041 Nontoxic single thyroid nodule: Secondary | ICD-10-CM | POA: Diagnosis not present

## 2018-12-07 DIAGNOSIS — R300 Dysuria: Secondary | ICD-10-CM | POA: Diagnosis not present

## 2018-12-07 DIAGNOSIS — G4489 Other headache syndrome: Secondary | ICD-10-CM

## 2018-12-07 DIAGNOSIS — Z124 Encounter for screening for malignant neoplasm of cervix: Secondary | ICD-10-CM | POA: Diagnosis not present

## 2018-12-07 NOTE — Progress Notes (Signed)
[facility] [place], [place] [address]  Internal MEDICINE  Office Visit Note  Patient Name: Teresa Crawford  [id number]  [id number]  Date of Service: 12/14/2018   Pt is here for routine health maintenance examination  Chief Complaint  Patient presents with  . Annual Exam  . Gynecologic Exam     The patient is here for health maintenance exam with pap smear today. She is doing well, overall. She has much less abdominal pain since she had partial hysterectomy. No longer on hormonal supplements. She does have history ol thyroid and multinodular goiter. Is due to have repeat ultrasound for surveillance. She is also due to have routine, fasting labs.     Current Medication: Outpatient Encounter Medications as of 12/07/2018  Medication Sig  . acetaminophen (TYLENOL) 500 MG tablet 2 TABS PO BID PRN FOR HEADACHES AND PAINS  . albuterol (PROAIR HFA) 108 (90 Base) MCG/ACT inhaler Inhale 2 puffs into the lungs every 6 (six) hours as needed for wheezing or shortness of breath.  . cetirizine (ZYRTEC) 10 MG tablet TAKE 1 TABLET BY MOUTH AT BEDTIME FOR ALLERGIES  . diazepam (VALIUM) 5 MG tablet INSERT 1 TABLET VAGINALLY ONCE DAILY AT BEDTIME  . docusate sodium (COLACE) 100 MG capsule TAKE 1 CAPSULE BY MOUTH TWICE DAILY FOR STOOL SOFTENER  . EPINEPHrine 0.3 mg/0.3 mL IJ SOAJ injection USE FOR ANAPHYLAXIS  . fluticasone (FLONASE) 50 MCG/ACT nasal spray USE 1 SPRAY INTO EACH NOSTRIL ONCE DAILYFOR CONGESTION OR ALLERGIES  . ibuprofen (ADVIL,MOTRIN) 600 MG tablet ONE TAB PO BID PRN FOR PAIN AND HEADACHES  . omeprazole (PRILOSEC) 40 MG capsule TAKE 1 CAPSULE BY MOUTH TWICE DAILY  . topiramate (TOPAMAX) 50 MG tablet TAKE 1 TABLET BY MOUTH ONCE DAILY IN THEEVENING  . traMADol (ULTRAM) 50 MG tablet ONE TAB PO BID PRN FOR PAIN AND HEADCAHES  . traZODone (DESYREL) 50 MG tablet Take 1 tablet (50 mg total) by mouth at bedtime.  . [DISCONTINUED] FLOVENT HFA 220 MCG/ACT inhaler  INHALE 2 PUFFS INTO THE LUNGS, 2 TIMES PER DAY. RINSE MOUTH AFTER INHALATIONS  . sulfamethoxazole-trimethoprim (BACTRIM DS) 800-160 MG tablet Take 1 tablet by mouth 2 (two) times daily.  . [DISCONTINUED] SUMAtriptan (IMITREX) 50 MG tablet Take 1 tablet (50 mg total) by mouth once for 1 dose. Take 1 tab at the onshet of headache with zofran   No facility-administered encounter medications on file as of 12/07/2018.     Surgical History: Past Surgical History:  Procedure Laterality Date  . ABDOMINAL HYSTERECTOMY    . BACK SURGERY    . COLONOSCOPY  04/27/2006   Wohl-colitis, internal hemorrhoids  . COLONOSCOPY  02/2013   focal right colitis, left colon biopsy negative, focal active proctitis without chronicity  . ESOPHAGOGASTRODUODENOSCOPY  02/2013   multiple peptic & duodenal ulcers, negative h pylori  . INTRAUTERINE DEVICE INSERTION  2012  . LAPAROSCOPIC ENDOMETRIOSIS FULGURATION  2015   [name]  . OVARIAN CYST REMOVAL      Medical History: Past Medical History:  Diagnosis Date  . B12 deficiency anemia   . Chronic back pain   . Chronic fatigue syndrome   . Constipation   . Depression   . Elevated liver enzymes   . Endometriosis   . Fibroids   . Hepatic steatosis   . Hypothyroid   . Last menstrual period (LMP) > 10 days ago 09/12/15  . Mental retardation   . Migraine   . Post traumatic stress disorder   .  Posttraumatic stress disorder   . PUD (peptic ulcer disease)   . Scoliosis   . Splenomegaly   . Uterine fibroid     Family History: Family History  Problem Relation Age of Onset  . Breast cancer Mother 87  . COPD Father   . Lung cancer Father 18  . Dementia Father   . Diabetes Sister   . Diabetes Brother   . Ovarian cancer Paternal Aunt   . Colon cancer Maternal Grandfather 45  . Liver disease Maternal Aunt        ?etiology  . Lymphoma Maternal Grandmother 60  . Lung cancer Other 32       maternal great grandfather  . Stomach cancer Other 27       maternal  great grandmother      Review of Systems  Constitutional: Negative for activity change, chills, fatigue and unexpected weight change.  HENT: Negative for congestion, postnasal drip, rhinorrhea, sneezing and sore throat.   Respiratory: Negative for cough, chest tightness, shortness of breath and wheezing.   Cardiovascular: Negative for chest pain and palpitations.  Gastrointestinal: Negative for abdominal pain, constipation, diarrhea, nausea and vomiting.  Endocrine:       History of thyroid nodules.   Genitourinary: Negative for dysuria, frequency, pelvic pain and urgency.  Musculoskeletal: Negative for arthralgias, back pain, joint swelling and neck pain.  Skin: Negative for rash.  Allergic/Immunologic: Negative for environmental allergies.  Neurological: Negative for dizziness, tremors, numbness and headaches.       History of seizure disorder.   Hematological: Negative for adenopathy. Does not bruise/bleed easily.  Psychiatric/Behavioral: Negative for behavioral problems (Depression), sleep disturbance and suicidal ideas. The patient is nervous/anxious.     Today's Vitals   12/07/18 1025  BP: 110/82  Pulse: 82  Resp: 16  SpO2: 99%  Weight: 205 lb (93 kg)  Height: 5\' 4"  (1.626 m)   Body mass index is 35.19 kg/m.  Physical Exam Vitals signs and nursing note reviewed.  Constitutional:      General: She is not in acute distress.    Appearance: Normal appearance. She is well-developed. She is not diaphoretic.  HENT:     Head: Normocephalic and atraumatic.     Mouth/Throat:     Pharynx: No oropharyngeal exudate.  Eyes:     Pupils: Pupils are equal, round, and reactive to light.  Neck:     Musculoskeletal: Normal range of motion and neck supple.     Thyroid: Thyromegaly present. No thyroid mass or thyroid tenderness.     Vascular: No JVD.     Trachea: No tracheal deviation.  Cardiovascular:     Rate and Rhythm: Normal rate and regular rhythm.     Pulses: Normal  pulses.     Heart sounds: Normal heart sounds. No murmur. No friction rub. No gallop.   Pulmonary:     Effort: Pulmonary effort is normal. No respiratory distress.     Breath sounds: No wheezing or rales.  Chest:     Chest wall: No tenderness.     Breasts:        Right: No swelling, bleeding, inverted nipple, mass, nipple discharge, skin change or tenderness.        Left: Normal. No swelling, bleeding, inverted nipple, mass, nipple discharge, skin change or tenderness.  Abdominal:     General: Bowel sounds are normal.     Palpations: Abdomen is soft.     Tenderness: There is no abdominal tenderness.  Hernia: There is no hernia in the left inguinal area or right inguinal area.  Genitourinary:    General: Normal vulva.     Exam position: Supine.     Labia:        Right: No rash, tenderness, lesion or injury.        Left: No rash, tenderness, lesion or injury.      Vagina: Normal. No signs of injury. No vaginal discharge, erythema or tenderness.     Cervix: No discharge, friability, erythema or cervical bleeding.     Uterus: Absent.      Adnexa: Right adnexa normal and left adnexa normal.     Comments: No tenderness, masses, or organomeglay present during bimanual exam . Musculoskeletal: Normal range of motion.  Lymphadenopathy:     Cervical: No cervical adenopathy.     Lower Body: No right inguinal adenopathy. No left inguinal adenopathy.  Skin:    General: Skin is warm and dry.  Neurological:     Mental Status: She is alert and oriented to person, place, and time. Mental status is at baseline.     Cranial Nerves: No cranial nerve deficit.  Psychiatric:        Behavior: Behavior normal.        Thought Content: Thought content normal.        Judgment: Judgment normal.    Depression screen [facility] 2/9 12/07/2018 08/30/2018 05/31/2018 01/11/2018 10/13/2017  Decreased Interest 0 0 0 0 0  Down, Depressed, Hopeless 0 0 0 0 0  PHQ - 2 Score 0 0 0 0 0    Functional Status Survey: Is  the patient deaf or have difficulty hearing?: No Does the patient have difficulty seeing, even when wearing glasses/contacts?: No Does the patient have difficulty concentrating, remembering, or making decisions?: No Does the patient have difficulty walking or climbing stairs?: No Does the patient have difficulty dressing or bathing?: No Does the patient have difficulty doing errands alone such as visiting a doctor's office or shopping?: No  MMSE - Mini Mental State Exam 10/13/2017  Orientation to time 5  Orientation to Place 5  Registration 3  Attention/ Calculation 5  Recall 3  Language- name 2 objects 0  Language- repeat 1  Language- follow 3 step command 3  Language- read & follow direction 1  Write a sentence 0  Copy design 1  Total score 27    Fall Risk  12/07/2018 08/30/2018 05/31/2018 01/11/2018 10/13/2017  Falls in the past year? 0 0 0 No No  Comment - - - - -  Number falls in past yr: - - - - -  Injury with Fall? - - - - -      LABS: Recent Results (from the past 2160 hour(s))  Urinalysis, Routine w reflex microscopic     Status: Abnormal   Collection Time: 12/07/18 10:26 AM  Result Value Ref Range   Specific Gravity, UA 1.027 1.005 - 1.030   pH, UA 6.0 5.0 - 7.5   Color, UA Yellow Yellow   Appearance Ur Turbid (A) Clear   Leukocytes,UA Trace (A) Negative   Protein,UA Trace Negative/Trace   Glucose, UA Negative Negative   Ketones, UA Negative Negative   RBC, UA Trace (A) Negative   Bilirubin, UA Negative Negative   Urobilinogen, Ur 0.2 0.2 - 1.0 mg/dL   Nitrite, UA Positive (A) Negative   Microscopic Examination See below:     Comment: Microscopic was indicated and was performed.  Microscopic Examination  Status: Abnormal   Collection Time: 12/07/18 10:26 AM   URINE  Result Value Ref Range   WBC, UA 0-5 0 - 5 /hpf   RBC 3-10 (A) 0 - 2 /hpf   Epithelial Cells (non renal) >10 (A) 0 - 10 /hpf   Casts None seen None seen /lpf   Mucus, UA Present Not Estab.    Bacteria, UA Few None seen/Few  Comprehensive metabolic panel     Status: Abnormal   Collection Time: 12/09/18  2:28 PM  Result Value Ref Range   Glucose 88 65 - 99 mg/dL   BUN 9 6 - 20 mg/dL   Creatinine, Ser 0.76 0.57 - 1.00 mg/dL   GFR calc non Af Amer 103 >59 mL/min/1.73   GFR calc Af Amer 118 >59 mL/min/1.73   BUN/Creatinine Ratio 12 9 - 23   Sodium 140 134 - 144 mmol/L   Potassium 4.3 3.5 - 5.2 mmol/L   Chloride 103 96 - 106 mmol/L   CO2 23 20 - 29 mmol/L   Calcium 9.1 8.7 - 10.2 mg/dL   Total Protein 6.5 6.0 - 8.5 g/dL   Albumin 4.1 3.8 - 4.8 g/dL   Globulin, Total 2.4 1.5 - 4.5 g/dL   Albumin/Globulin Ratio 1.7 1.2 - 2.2   Bilirubin Total 0.4 0.0 - 1.2 mg/dL   Alkaline Phosphatase 138 (H) 39 - 117 IU/L   AST 12 0 - 40 IU/L   ALT 19 0 - 32 IU/L  CBC     Status: Abnormal   Collection Time: 12/09/18  2:28 PM  Result Value Ref Range   WBC 5.7 3.4 - 10.8 x10E3/uL   RBC 4.85 3.77 - 5.28 x10E6/uL   Hemoglobin 12.7 11.1 - 15.9 g/dL   Hematocrit 38.3 34.0 - 46.6 %   MCV 79 79 - 97 fL   MCH 26.2 (L) 26.6 - 33.0 pg   MCHC 33.2 31.5 - 35.7 g/dL   RDW 15.3 11.7 - 15.4 %   Platelets 222 150 - 450 x10E3/uL  Lipid Panel w/o Chol/HDL Ratio     Status: Abnormal   Collection Time: 12/09/18  2:28 PM  Result Value Ref Range   Cholesterol, Total 157 100 - 199 mg/dL   Triglycerides 102 0 - 149 mg/dL   HDL 38 (L) >39 mg/dL   VLDL Cholesterol Cal 19 5 - 40 mg/dL   LDL Chol Calc (NIH) 100 (H) 0 - 99 mg/dL  T4, free     Status: None   Collection Time: 12/09/18  2:28 PM  Result Value Ref Range   Free T4 0.92 0.82 - 1.77 ng/dL  TSH     Status: None   Collection Time: 12/09/18  2:28 PM  Result Value Ref Range   TSH 2.740 0.450 - 4.500 uIU/mL  VITAMIN D 25 Hydroxy (Vit-D Deficiency, Fractures)     Status: Abnormal   Collection Time: 12/09/18  2:28 PM  Result Value Ref Range   Vit D, 25-Hydroxy 12.6 (L) 30.0 - 100.0 ng/mL    Comment: Vitamin D deficiency has been defined by the  Institute of Medicine and an Endocrine Society practice guideline as a level of serum 25-OH vitamin D less than 20 ng/mL (1,2). The Endocrine Society went on to further define vitamin D insufficiency as a level between 21 and 29 ng/mL (2). 1. IOM (Institute of Medicine). 2010. Dietary reference    intakes for calcium and D. [place]: The    [employer]. 2. Holick MF,  Binkley Eldon, Bischoff-Ferrari HA, et al.    Evaluation, treatment, and prevention of vitamin D    deficiency: an Endocrine Society clinical practice    guideline. JCEM. 2011 Jul; 96(7):1911-30.   SARS CORONAVIRUS 2 (TAT 6-24 HRS) Nasopharyngeal Nasopharyngeal Swab     Status: None   Collection Time: 12/13/18  1:49 PM   Specimen: Nasopharyngeal Swab  Result Value Ref Range   SARS Coronavirus 2 NEGATIVE NEGATIVE    Comment: (NOTE) SARS-CoV-2 target nucleic acids are NOT DETECTED. The SARS-CoV-2 RNA is generally detectable in upper and lower respiratory specimens during the acute phase of infection. Negative results do not preclude SARS-CoV-2 infection, do not rule out co-infections with other pathogens, and should not be used as the sole basis for treatment or other patient management decisions. Negative results must be combined with clinical observations, patient history, and epidemiological information. The expected result is Negative. Fact Sheet for Patients: SugarRoll.be Fact Sheet for Healthcare Providers: https://www.woods-mathews.com/ This test is not yet approved or cleared by the [place] FDA and  has been authorized for detection and/or diagnosis of SARS-CoV-2 by FDA under an Emergency Use Authorization (EUA). This EUA will remain  in effect (meaning this test can be used) for the duration of the COVID-19 declaration under Section 56 4(b)(1) of the Act, 21 U.S.C. section 360bbb-3(b)(1), unless the authorization is terminated or revoked  sooner. Performed at [facility], [place] [address] [place], [place] [address]   Pregnancy, urine POC     Status: None   Collection Time: 12/13/18  3:12 PM  Result Value Ref Range   Preg Test, Ur NEGATIVE NEGATIVE    Comment:        THE SENSITIVITY OF THIS METHODOLOGY IS >24 mIU/mL    Assessment/Plan:  1. Encounter for general adult medical examination with abnormal findings Annual health maintenance exam with pap smear today.  2. Nontoxic single thyroid nodule Will get ultrasound for surveillance. Follow up for results.  - US Soft Tissue Head/Neck; Future  3. Other headache syndrome Continue topamax as prescribed.   4. Urinary tract infection with hematuria, site unspecified Urine sample positive for infection. Treat with bactrim DS bid for 10 days.  - sulfamethoxazole-trimethoprim (BACTRIM DS) 800-160 MG tablet; Take 1 tablet by mouth 2 (two) times daily.  Dispense: 20 tablet; Refill: 0  5. Routine cervical smear - Pap IG and HPV (high risk) DNA detection  6. Dysuria - Urinalysis, Routine w reflex microscopic  General Counseling: Roselynne verbalizes understanding of the findings of todays visit and agrees with plan of treatment. I have discussed any further diagnostic evaluation that may be needed or ordered today. We also reviewed her medications today. she has been encouraged to call the office with any questions or concerns that should arise related to todays visit.    Counseling:   This patient was seen by Leretha Pol FNP Collaboration with Dr Lavera Guise as a part of collaborative care agreement  Orders Placed This Encounter  Procedures  . Microscopic Examination  . US Soft Tissue Head/Neck  . Urinalysis, Routine w reflex microscopic    Meds ordered this encounter  Medications  . sulfamethoxazole-trimethoprim (BACTRIM DS) 800-160 MG tablet    Sig: Take 1 tablet by mouth 2 (two) times daily.    Dispense:  20 tablet    Refill:  0    Order Specific  Question:   Supervising Provider    Answer:   Lavera Guise [id number]    Time  spent: [place], MD  Internal Medicine

## 2018-12-08 ENCOUNTER — Other Ambulatory Visit: Payer: Self-pay | Admitting: Internal Medicine

## 2018-12-08 LAB — URINALYSIS, ROUTINE W REFLEX MICROSCOPIC
Bilirubin, UA: NEGATIVE
Glucose, UA: NEGATIVE
Ketones, UA: NEGATIVE
Nitrite, UA: POSITIVE — AB
Specific Gravity, UA: 1.027 (ref 1.005–1.030)
Urobilinogen, Ur: 0.2 mg/dL (ref 0.2–1.0)
pH, UA: 6 (ref 5.0–7.5)

## 2018-12-08 LAB — MICROSCOPIC EXAMINATION
Casts: NONE SEEN /lpf
Epithelial Cells (non renal): >10 /hpf — AB (ref 0–10)

## 2018-12-09 ENCOUNTER — Telehealth: Payer: Self-pay

## 2018-12-09 ENCOUNTER — Other Ambulatory Visit: Payer: Self-pay

## 2018-12-09 ENCOUNTER — Other Ambulatory Visit: Payer: Self-pay | Admitting: Nurse Practitioner

## 2018-12-09 DIAGNOSIS — E756 Lipid storage disorder, unspecified: Secondary | ICD-10-CM | POA: Diagnosis not present

## 2018-12-09 DIAGNOSIS — E559 Vitamin D deficiency, unspecified: Secondary | ICD-10-CM | POA: Diagnosis not present

## 2018-12-09 DIAGNOSIS — E041 Nontoxic single thyroid nodule: Secondary | ICD-10-CM | POA: Diagnosis not present

## 2018-12-09 DIAGNOSIS — G4489 Other headache syndrome: Secondary | ICD-10-CM | POA: Diagnosis not present

## 2018-12-09 DIAGNOSIS — Z0001 Encounter for general adult medical examination with abnormal findings: Secondary | ICD-10-CM | POA: Diagnosis not present

## 2018-12-09 MED ORDER — SULFAMETHOXAZOLE-TRIMETHOPRIM 800-160 MG PO TABS: 1.0000 | ORAL_TABLET | Freq: Two times a day (BID) | ORAL | 0 refills | Status: DC

## 2018-12-09 NOTE — Telephone Encounter (Signed)
Send message not enough urine for culture

## 2018-12-09 NOTE — Progress Notes (Signed)
Please let the patient know that urine dample from physical shows infection. I added Bactrim DS twice daily for 10 days. I sent this to [facility] for her. Thanks.  Also, can you see if [facility] can add culture and sensitivity to this. Thanks.

## 2018-12-10 LAB — COMPREHENSIVE METABOLIC PANEL
ALT: 19 IU/L (ref 0–32)
AST: 12 IU/L (ref 0–40)
Albumin/Globulin Ratio: 1.7 (ref 1.2–2.2)
Albumin: 4.1 g/dL (ref 3.8–4.8)
Alkaline Phosphatase: 138 IU/L — ABNORMAL HIGH (ref 39–117)
BUN/Creatinine Ratio: 12 (ref 9–23)
BUN: 9 mg/dL (ref 6–20)
Bilirubin Total: 0.4 mg/dL (ref 0.0–1.2)
CO2: 23 mmol/L (ref 20–29)
Calcium: 9.1 mg/dL (ref 8.7–10.2)
Chloride: 103 mmol/L (ref 96–106)
Creatinine, Ser: 0.76 mg/dL (ref 0.57–1.00)
GFR calc Af Amer: 118 mL/min/{1.73_m2} (ref >59)
GFR calc non Af Amer: 103 mL/min/{1.73_m2} (ref >59)
Globulin, Total: 2.4 g/dL (ref 1.5–4.5)
Glucose: 88 mg/dL (ref 65–99)
Potassium: 4.3 mmol/L (ref 3.5–5.2)
Sodium: 140 mmol/L (ref 134–144)
Total Protein: 6.5 g/dL (ref 6.0–8.5)

## 2018-12-10 LAB — CBC
Hematocrit: 38.3 % (ref 34.0–46.6)
Hemoglobin: 12.7 g/dL (ref 11.1–15.9)
MCH: 26.2 pg — ABNORMAL LOW (ref 26.6–33.0)
MCHC: 33.2 g/dL (ref 31.5–35.7)
MCV: 79 fL (ref 79–97)
Platelets: 222 10*3/uL (ref 150–450)
RBC: 4.85 x10E6/uL (ref 3.77–5.28)
RDW: 15.3 % (ref 11.7–15.4)
WBC: 5.7 10*3/uL (ref 3.4–10.8)

## 2018-12-10 LAB — TSH: TSH: 2.74 u[IU]/mL (ref 0.450–4.500)

## 2018-12-10 LAB — LIPID PANEL W/O CHOL/HDL RATIO
Cholesterol, Total: 157 mg/dL (ref 100–199)
HDL: 38 mg/dL — ABNORMAL LOW (ref >39)
LDL Chol Calc (NIH): 100 mg/dL — ABNORMAL HIGH (ref 0–99)
Triglycerides: 102 mg/dL (ref 0–149)
VLDL Cholesterol Cal: 19 mg/dL (ref 5–40)

## 2018-12-10 LAB — VITAMIN D 25 HYDROXY (VIT D DEFICIENCY, FRACTURES): Vit D, 25-Hydroxy: 12.6 ng/mL — ABNORMAL LOW (ref 30.0–100.0)

## 2018-12-10 LAB — T4, FREE: Free T4: 0.92 ng/dL (ref 0.82–1.77)

## 2018-12-13 ENCOUNTER — Other Ambulatory Visit: Payer: Self-pay

## 2018-12-13 ENCOUNTER — Ambulatory Visit: Payer: Medicare Other | Admitting: Internal Medicine

## 2018-12-13 ENCOUNTER — Emergency Department
Admission: EM | Admit: 2018-12-13 | Discharge: 2018-12-13 | Disposition: A | Discharge status: Home / Self Care | Payer: Medicare Other | Attending: Student in an Organized Health Care Education/Training Program | Admitting: Student in an Organized Health Care Education/Training Program

## 2018-12-13 ENCOUNTER — Emergency Department: Payer: Medicare Other

## 2018-12-13 VITALS — Ht 64.0 in | Wt 220.0 lb

## 2018-12-13 DIAGNOSIS — Z20828 Contact with and (suspected) exposure to other viral communicable diseases: Secondary | ICD-10-CM | POA: Insufficient documentation

## 2018-12-13 DIAGNOSIS — J4541 Moderate persistent asthma with (acute) exacerbation: Secondary | ICD-10-CM

## 2018-12-13 DIAGNOSIS — Z79899 Other long term (current) drug therapy: Secondary | ICD-10-CM | POA: Insufficient documentation

## 2018-12-13 DIAGNOSIS — F410 Panic disorder [episodic paroxysmal anxiety] without agoraphobia: Secondary | ICD-10-CM

## 2018-12-13 DIAGNOSIS — R05 Cough: Secondary | ICD-10-CM | POA: Diagnosis not present

## 2018-12-13 DIAGNOSIS — J45901 Unspecified asthma with (acute) exacerbation: Secondary | ICD-10-CM | POA: Insufficient documentation

## 2018-12-13 DIAGNOSIS — E039 Hypothyroidism, unspecified: Secondary | ICD-10-CM | POA: Insufficient documentation

## 2018-12-13 DIAGNOSIS — R0602 Shortness of breath: Secondary | ICD-10-CM | POA: Diagnosis not present

## 2018-12-13 LAB — POCT PREGNANCY, URINE: Preg Test, Ur: NEGATIVE

## 2018-12-13 MED ORDER — PREDNISONE 20 MG PO TABS: 40.0000 mg | ORAL_TABLET | Freq: Every day | ORAL | 0 refills | Status: AC

## 2018-12-13 MED ORDER — PREDNISONE 20 MG PO TABS
60.0000 mg | ORAL_TABLET | Freq: Once | ORAL | Status: AC
  Administered 2018-12-13: 14:00:00 60 mg via ORAL
  Filled 2018-12-13: qty 3

## 2018-12-13 MED ORDER — ALBUTEROL SULFATE HFA 108 (90 BASE) MCG/ACT IN AERS: 2.0000 | INHALATION_SPRAY | Freq: Four times a day (QID) | RESPIRATORY_TRACT | 1 refills | Status: DC | PRN

## 2018-12-13 MED ORDER — IPRATROPIUM-ALBUTEROL 0.5-2.5 (3) MG/3ML IN SOLN
3.0000 mL | Freq: Once | RESPIRATORY_TRACT | Status: AC
  Administered 2018-12-13: 3 mL via RESPIRATORY_TRACT
  Filled 2018-12-13: qty 3

## 2018-12-13 MED ORDER — IPRATROPIUM-ALBUTEROL 0.5-2.5 (3) MG/3ML IN SOLN
3.0000 mL | Freq: Once | RESPIRATORY_TRACT | Status: AC
  Administered 2018-12-13: 14:00:00 3 mL via RESPIRATORY_TRACT
  Filled 2018-12-13: qty 3

## 2018-12-13 NOTE — ED Triage Notes (Signed)
C/O cough and SOB x 6 days.  States has been using inhaler, with out relief.

## 2018-12-13 NOTE — ED Provider Notes (Signed)
[facility] Emergency Department Provider Note    First MD Initiated Contact with Patient 12/13/18 1338     (approximate)  I have reviewed the triage vital signs and the nursing notes.   HISTORY  Chief Complaint No chief complaint on file.    HPI Teresa Crawford is a 33 y.o. female  With the below listed past medical history presents the ER for shortness of breath.  States this been going on since Wednesday.  Feels like she is having an asthma exacerbation.  Denies any fevers or chills states that she was placed on antibiotic by her PCP.  No steroids.  She denies any chest pain.  Would like to be tested for COVID but denies any close contacts.  Denies any abdominal pain.  Denies any recent prolonged immobilization, leg swelling, birth control use or history of DVTs.   Past Medical History:  Diagnosis Date  . B12 deficiency anemia   . Chronic back pain   . Chronic fatigue syndrome   . Constipation   . Depression   . Elevated liver enzymes   . Endometriosis   . Fibroids   . Hepatic steatosis   . Hypothyroid   . Last menstrual period (LMP) > 10 days ago 09/12/15  . Mental retardation   . Migraine   . Post traumatic stress disorder   . Posttraumatic stress disorder   . PUD (peptic ulcer disease)   . Scoliosis   . Splenomegaly   . Uterine fibroid    Family History  Problem Relation Age of Onset  . Breast cancer Mother 76  . COPD Father   . Lung cancer Father 31  . Dementia Father   . Diabetes Sister   . Diabetes Brother   . Ovarian cancer Paternal Aunt   . Colon cancer Maternal Grandfather 59  . Liver disease Maternal Aunt        ?etiology  . Lymphoma Maternal Grandmother 60  . Lung cancer Other 11       maternal great grandfather  . Stomach cancer Other 64       maternal great grandmother   Past Surgical History:  Procedure Laterality Date  . ABDOMINAL HYSTERECTOMY    . BACK SURGERY    . COLONOSCOPY  04/27/2006   Wohl-colitis,  internal hemorrhoids  . COLONOSCOPY  02/2013   focal right colitis, left colon biopsy negative, focal active proctitis without chronicity  . ESOPHAGOGASTRODUODENOSCOPY  02/2013   multiple peptic & duodenal ulcers, negative h pylori  . INTRAUTERINE DEVICE INSERTION  2012  . LAPAROSCOPIC ENDOMETRIOSIS FULGURATION  2015   [name]  . OVARIAN CYST REMOVAL     Patient Active Problem List   Diagnosis Date Noted  . MDD (major depressive disorder), recurrent, in partial remission ([place]) 10/06/2018  . Insomnia due to mental condition 10/06/2018  . Headache disorder 08/03/2017  . Constipation, unspecified 04/16/2017  . Nontoxic single thyroid nodule 04/16/2017  . Migraine without aura, not intractable, without status migrainosus 04/16/2017  . Other obesity due to excess calories 04/16/2017  . Seizures ([place]) 03/31/2017  . Seizure-like activity ([place]) 02/02/2017  . Pressure injury of skin 12/30/2016  . Scoliosis 12/28/2016  . Hypothyroidism 12/28/2016  . Lower extremity weakness 12/28/2016  . Self-inflicted laceration of wrist 04/07/2016  . Major depressive disorder, recurrent episode, moderate ([place]) 10/29/2014  . Posttraumatic stress disorder 10/29/2014  . Grief 10/29/2014  . CFIDS (chronic fatigue and immune dysfunction syndrome) ([place]) 09/28/2014  . Aggrieved 09/28/2014  .  Depression, major, recurrent ([place]) 09/28/2014  . Intellectual disability 09/28/2014  . Neurosis, posttraumatic 09/28/2014  . History of colitis 09/22/2014  . Hepatic steatosis 09/20/2014  . Endometriosis 08/30/2013      Prior to Admission medications   Medication Sig Start Date End Date Taking? Authorizing Provider  acetaminophen (TYLENOL) 500 MG tablet 2 TABS PO BID PRN FOR HEADACHES AND PAINS 05/31/18   Lavera Guise, MD  albuterol [facility] HFA) 108 [phone] Base) MCG/ACT inhaler Inhale 2 puffs into the lungs every 6 (six) hours as needed for wheezing or shortness of breath. 05/31/18   Lavera Guise, MD  albuterol (VENTOLIN  HFA) 108 (90 Base) MCG/ACT inhaler Inhale 2 puffs into the lungs every 6 (six) hours as needed for wheezing or shortness of breath. 12/13/18   Merlyn Lot, MD  cetirizine (ZYRTEC) 10 MG tablet TAKE 1 TABLET BY MOUTH AT BEDTIME FOR ALLERGIES 10/04/18   Lavera Guise, MD  diazepam (VALIUM) 5 MG tablet INSERT 1 TABLET VAGINALLY ONCE DAILY AT BEDTIME 11/30/18   Ronnell Freshwater, NP  docusate sodium (COLACE) 100 MG capsule TAKE 1 CAPSULE BY MOUTH TWICE DAILY FOR STOOL SOFTENER 11/30/18   Ronnell Freshwater, NP  EPINEPHrine 0.3 mg/0.3 mL IJ SOAJ injection USE FOR ANAPHYLAXIS 06/14/17   Lavera Guise, MD  FLOVENT HFA 220 MCG/ACT inhaler INHALE 2 PUFFS INTO THE LUNGS, 2 TIMES PER DAY. RINSE MOUTH AFTER INHALATIONS 12/09/18   Ronnell Freshwater, NP  fluticasone (FLONASE) 50 MCG/ACT nasal spray USE 1 SPRAY INTO EACH NOSTRIL ONCE DAILYFOR CONGESTION OR ALLERGIES 06/15/18   Ronnell Freshwater, NP  ibuprofen (ADVIL,MOTRIN) 600 MG tablet ONE TAB PO BID PRN FOR PAIN AND HEADACHES 05/31/18   Lavera Guise, MD  omeprazole (PRILOSEC) 40 MG capsule TAKE 1 CAPSULE BY MOUTH TWICE DAILY 08/01/18   Lavera Guise, MD  predniSONE (DELTASONE) 20 MG tablet Take 2 tablets (40 mg total) by mouth daily for 5 days. 12/13/18 12/18/18  Merlyn Lot, MD  sulfamethoxazole-trimethoprim (BACTRIM DS) 800-160 MG tablet Take 1 tablet by mouth 2 (two) times daily. 12/09/18   Ronnell Freshwater, NP  topiramate (TOPAMAX) 50 MG tablet TAKE 1 TABLET BY MOUTH ONCE DAILY IN THEEVENING 10/04/18   Lavera Guise, MD  traMADol Veatrice Bourbon) 50 MG tablet ONE TAB PO BID PRN FOR PAIN AND HEADCAHES 06/01/18   Kendell Bane, NP  traZODone (DESYREL) 50 MG tablet Take 1 tablet (50 mg total) by mouth at bedtime. 10/06/18   Ursula Alert, MD    Allergies Other, Peanut butter flavor, Sertraline, Codeine, Paroxetine, Paroxetine hcl, Venlafaxine, and Zoloft [sertraline hcl]    Social History Social History   Tobacco Use  . Smoking status: Never Smoker  . Smokeless  tobacco: Never Used  Substance Use Topics  . Alcohol use: Not Currently    Alcohol/week: 0.0 standard drinks  . Drug use: No    Review of Systems Patient denies headaches, rhinorrhea, blurry vision, numbness, shortness of breath, chest pain, edema, cough, abdominal pain, nausea, vomiting, diarrhea, dysuria, fevers, rashes or hallucinations unless otherwise stated above in HPI. ____________________________________________   PHYSICAL EXAM:  VITAL SIGNS: Vitals:   12/13/18 1252 12/13/18 1505  BP:    Pulse:    Resp: 20   Temp:    SpO2:  96%    Constitutional: Alert and oriented.  Eyes: Conjunctivae are normal.  Head: Atraumatic. Nose: No congestion/rhinnorhea. Mouth/Throat: Mucous membranes are moist.   Neck: No stridor. Painless ROM.  Cardiovascular: Normal  rate, regular rhythm. Grossly normal heart sounds.  Good peripheral circulation. Respiratory:mild tachypnea with diffuse coarse wheeze in all lung fields Gastrointestinal: Soft and nontender. No distention. No abdominal bruits. No CVA tenderness. Genitourinary:  Musculoskeletal: No lower extremity tenderness nor edema.  No joint effusions. Neurologic:  Normal speech and language. No gross focal neurologic deficits are appreciated. No facial droop Skin:  Skin is warm, dry and intact. No rash noted. Psychiatric: Mood and affect are normal. Speech and behavior are normal.  ____________________________________________   LABS (all labs ordered are listed, but only abnormal results are displayed)  Results for orders placed or performed during the hospital encounter of 12/13/18 (from the past 24 hour(s))  Pregnancy, urine POC     Status: None   Collection Time: 12/13/18  3:12 PM  Result Value Ref Range   Preg Test, Ur NEGATIVE NEGATIVE   ____________________________________________  EKG My review and personal interpretation at Time: 12:37   Indication: sob  Rate: 75  Rhythm: sinus Axis: normal Other: normal intervals,  no stemi ____________________________________________  RADIOLOGY  I personally reviewed all radiographic images ordered to evaluate for the above acute complaints and reviewed radiology reports and findings.  These findings were personally discussed with the patient.  Please see medical record for radiology report.  ____________________________________________   PROCEDURES  Procedure(s) performed:  Procedures    Critical Care performed: o ____________________________________________   INITIAL IMPRESSION / ASSESSMENT AND PLAN / ED COURSE  Pertinent labs & imaging results that were available during my care of the patient were reviewed by me and considered in my medical decision making (see chart for details).   DDX: Asthma, copd, CHF, pna, ptx, malignancy, Pe, anemia   KAMBRYA TOAL is a 33 y.o. who presents to the ED with sob as described above.  Patient is AFVSS in ED. Exam as above. Given current presentation have considered the above differential.  Low risk by wells and perc negative.  Exam c/w asthma exacerbation.  No hypoxia.  No PTX.  Will give nebs, pred and reassess.  Will also test for covid.  Clinical Course as of Dec 13 1531  Tue Dec 13, 2018  1531 Patient able to ambulate without any hypoxia or increased work of breathing.  Symptoms improving after steroids as well as nebulizer.  At this point do believe patient stable and appropriate for outpatient follow-up.   [PR]    Clinical Course User Index [PR] Merlyn Lot, MD    The patient was evaluated in Emergency Department today for the symptoms described in the history of present illness. He/she was evaluated in the context of the global COVID-19 pandemic, which necessitated consideration that the patient might be at risk for infection with the SARS-CoV-2 virus that causes COVID-19. Institutional protocols and algorithms that pertain to the evaluation of patients at risk for COVID-19 are in a state of rapid  change based on information released by regulatory bodies including the CDC and federal and state organizations. These policies and algorithms were followed during the patient's care in the ED.  As part of my medical decision making, I reviewed the following data within the [facility] notes reviewed and incorporated, Labs reviewed, notes from prior ED visits and [place] Controlled Substance Database   ____________________________________________   FINAL CLINICAL IMPRESSION(S) / ED DIAGNOSES  Final diagnoses:  Mild asthma with exacerbation, unspecified whether persistent      NEW MEDICATIONS STARTED DURING THIS VISIT:  New Prescriptions   ALBUTEROL (VENTOLIN HFA)  108 (90 BASE) MCG/ACT INHALER    Inhale 2 puffs into the lungs every 6 (six) hours as needed for wheezing or shortness of breath.   PREDNISONE (DELTASONE) 20 MG TABLET    Take 2 tablets (40 mg total) by mouth daily for 5 days.     Note:  This document was prepared using Dragon voice recognition software and may include unintentional dictation errors.    Merlyn Lot, MD 12/13/18 [phone]

## 2018-12-13 NOTE — ED Notes (Signed)
Pt ambulated in room with O2 sats staying between 95-100%

## 2018-12-13 NOTE — Progress Notes (Signed)
[facility] [place], [place] [address]  Internal MEDICINE  Telephone Visit  Patient Name: Teresa Crawford  [id number]  [id number]  Date of Service: 12/13/2018  I connected with the patient at 1125 by telephone and verified the patients identity using two identifiers.   I discussed the limitations, risks, security and privacy concerns of performing an evaluation and management service by telephone and the availability of in person appointments. I also discussed with the patient that there may be a patient responsible charge related to the service.  The patient expressed understanding and agrees to proceed.    Chief Complaint  Patient presents with  . Telephone Assessment  . Telephone Screen  . Shortness of Breath  . Wheezing    HPI Pt is unable to speak full sentences and is sob with audible upper airway  wheeze  Her caregiver was with her and was instructed to go to ED    Current Medication: Outpatient Encounter Medications as of 12/13/2018  Medication Sig  . acetaminophen (TYLENOL) 500 MG tablet 2 TABS PO BID PRN FOR HEADACHES AND PAINS  . albuterol (PROAIR HFA) 108 (90 Base) MCG/ACT inhaler Inhale 2 puffs into the lungs every 6 (six) hours as needed for wheezing or shortness of breath.  . cetirizine (ZYRTEC) 10 MG tablet TAKE 1 TABLET BY MOUTH AT BEDTIME FOR ALLERGIES  . diazepam (VALIUM) 5 MG tablet INSERT 1 TABLET VAGINALLY ONCE DAILY AT BEDTIME  . docusate sodium (COLACE) 100 MG capsule TAKE 1 CAPSULE BY MOUTH TWICE DAILY FOR STOOL SOFTENER  . EPINEPHrine 0.3 mg/0.3 mL IJ SOAJ injection USE FOR ANAPHYLAXIS  . FLOVENT HFA 220 MCG/ACT inhaler INHALE 2 PUFFS INTO THE LUNGS, 2 TIMES PER DAY. RINSE MOUTH AFTER INHALATIONS  . fluticasone (FLONASE) 50 MCG/ACT nasal spray USE 1 SPRAY INTO EACH NOSTRIL ONCE DAILYFOR CONGESTION OR ALLERGIES  . ibuprofen (ADVIL,MOTRIN) 600 MG tablet ONE TAB PO BID PRN FOR PAIN AND HEADACHES  . omeprazole (PRILOSEC) 40 MG capsule  TAKE 1 CAPSULE BY MOUTH TWICE DAILY  . sulfamethoxazole-trimethoprim (BACTRIM DS) 800-160 MG tablet Take 1 tablet by mouth 2 (two) times daily.  Marland Kitchen topiramate (TOPAMAX) 50 MG tablet TAKE 1 TABLET BY MOUTH ONCE DAILY IN THEEVENING  . traMADol (ULTRAM) 50 MG tablet ONE TAB PO BID PRN FOR PAIN AND HEADCAHES  . traZODone (DESYREL) 50 MG tablet Take 1 tablet (50 mg total) by mouth at bedtime.   No facility-administered encounter medications on file as of 12/13/2018.     Surgical History: Past Surgical History:  Procedure Laterality Date  . ABDOMINAL HYSTERECTOMY    . BACK SURGERY    . COLONOSCOPY  04/27/2006   Wohl-colitis, internal hemorrhoids  . COLONOSCOPY  02/2013   focal right colitis, left colon biopsy negative, focal active proctitis without chronicity  . ESOPHAGOGASTRODUODENOSCOPY  02/2013   multiple peptic & duodenal ulcers, negative h pylori  . INTRAUTERINE DEVICE INSERTION  2012  . LAPAROSCOPIC ENDOMETRIOSIS FULGURATION  2015   [name]  . OVARIAN CYST REMOVAL      Medical History: Past Medical History:  Diagnosis Date  . B12 deficiency anemia   . Chronic back pain   . Chronic fatigue syndrome   . Constipation   . Depression   . Elevated liver enzymes   . Endometriosis   . Fibroids   . Hepatic steatosis   . Hypothyroid   . Last menstrual period (LMP) > 10 days ago 09/12/15  . Mental retardation   . Migraine   .  Post traumatic stress disorder   . Posttraumatic stress disorder   . PUD (peptic ulcer disease)   . Scoliosis   . Splenomegaly   . Uterine fibroid     Family History: Family History  Problem Relation Age of Onset  . Breast cancer Mother 72  . COPD Father   . Lung cancer Father 49  . Dementia Father   . Diabetes Sister   . Diabetes Brother   . Ovarian cancer Paternal Aunt   . Colon cancer Maternal Grandfather 49  . Liver disease Maternal Aunt        ?etiology  . Lymphoma Maternal Grandmother 60  . Lung cancer Other 73       maternal great  grandfather  . Stomach cancer Other 28       maternal great grandmother    Social History   Socioeconomic History  . Marital status: Single    Spouse name: Not on file  . Number of children: 0  . Years of education: Not on file  . Highest education level: Not on file  Occupational History  . Not on file  Social Needs  . Financial resource strain: Not hard at all  . Food insecurity    Worry: Never true    Inability: Never true  . Transportation needs    Medical: No    Non-medical: No  Tobacco Use  . Smoking status: Never Smoker  . Smokeless tobacco: Never Used  Substance and Sexual Activity  . Alcohol use: Not Currently    Alcohol/week: 0.0 standard drinks  . Drug use: No  . Sexual activity: Not Currently  Lifestyle  . Physical activity    Days per week: 0 days    Minutes per session: 0 min  . Stress: Not at all  Relationships  . Social Herbalist on phone: Patient refused    Gets together: Patient refused    Attends religious service: Patient refused    Active member of club or organization: Patient refused    Attends meetings of clubs or organizations: Patient refused    Relationship status: Patient refused  . Intimate partner violence    Fear of current or ex partner: Patient refused    Emotionally abused: Patient refused    Physically abused: Patient refused    Forced sexual activity: Patient refused  Other Topics Concern  . Not on file  Social History Narrative   She has brother, half brother & sister live nearby   Both parents deceased   Care managed by [name]      Review of Systems  Constitutional: Positive for chills and fatigue.  HENT: Positive for congestion.   Respiratory: Positive for cough, shortness of breath and wheezing.   Cardiovascular: Positive for palpitations. Negative for chest pain.    Vital Signs: Ht 5\' 4"  (1.626 m)   Wt 220 lb (99.8 kg)   LMP 01/05/2016   BMI 37.76 kg/m    Observation/Objective: Pt  looked in mild to moderate resp distress and anxious    Assessment/Plan: 1. Moderate persistent asthma with acute exacerbation Pt is advised to got ED ASAP or call 911  2. Panic attack Pt has therapy at home   General Counseling: tabitha speiser understanding of the findings of today's phone visit and agrees with plan of treatment. I have discussed any further diagnostic evaluation that may be needed or ordered today. We also reviewed her medications today. she has been encouraged to call the office with  any questions or concerns that should arise related to todays visit.  Time spent:10 Minutes    Dr Lavera Guise Internal medicine

## 2018-12-13 NOTE — ED Triage Notes (Signed)
Patient says she is short of breath.  History of asthma

## 2018-12-13 NOTE — Progress Notes (Signed)
Duplicate note

## 2018-12-14 DIAGNOSIS — Z124 Encounter for screening for malignant neoplasm of cervix: Secondary | ICD-10-CM | POA: Insufficient documentation

## 2018-12-14 DIAGNOSIS — R300 Dysuria: Secondary | ICD-10-CM | POA: Insufficient documentation

## 2018-12-14 DIAGNOSIS — R319 Hematuria, unspecified: Secondary | ICD-10-CM | POA: Insufficient documentation

## 2018-12-14 DIAGNOSIS — N390 Urinary tract infection, site not specified: Secondary | ICD-10-CM | POA: Insufficient documentation

## 2018-12-14 LAB — SARS CORONAVIRUS 2 (TAT 6-24 HRS): SARS Coronavirus 2: NEGATIVE

## 2018-12-15 ENCOUNTER — Telehealth: Payer: Self-pay

## 2018-12-15 LAB — PAP IG AND HPV HIGH-RISK: HPV, high-risk: NEGATIVE

## 2018-12-15 NOTE — Progress Notes (Signed)
Can you let the patient know that her pap smear is normal. Thanks.

## 2018-12-15 NOTE — Telephone Encounter (Signed)
Per Nira Conn let pt know that her pap is normal.

## 2018-12-19 NOTE — Addendum Note (Signed)
Addended by: Leretha Pol on: 12/19/2018 01:06 PM   Modules accepted: Level of Service

## 2019-01-06 ENCOUNTER — Other Ambulatory Visit: Payer: Self-pay

## 2019-01-06 ENCOUNTER — Ambulatory Visit (INDEPENDENT_AMBULATORY_CARE_PROVIDER_SITE_OTHER): Payer: Medicare Other

## 2019-01-06 DIAGNOSIS — E041 Nontoxic single thyroid nodule: Secondary | ICD-10-CM | POA: Diagnosis not present

## 2019-01-27 ENCOUNTER — Other Ambulatory Visit: Payer: Self-pay | Admitting: Internal Medicine

## 2019-01-31 ENCOUNTER — Ambulatory Visit: Payer: Medicare Other | Admitting: Internal Medicine

## 2019-02-03 ENCOUNTER — Other Ambulatory Visit: Payer: Self-pay

## 2019-02-03 ENCOUNTER — Encounter: Payer: Self-pay | Admitting: Psychiatry

## 2019-02-03 ENCOUNTER — Ambulatory Visit (INDEPENDENT_AMBULATORY_CARE_PROVIDER_SITE_OTHER): Payer: Medicare Other | Admitting: Psychiatry

## 2019-02-03 DIAGNOSIS — F3342 Major depressive disorder, recurrent, in full remission: Secondary | ICD-10-CM | POA: Diagnosis not present

## 2019-02-03 DIAGNOSIS — F4310 Post-traumatic stress disorder, unspecified: Secondary | ICD-10-CM

## 2019-02-03 DIAGNOSIS — F79 Unspecified intellectual disabilities: Secondary | ICD-10-CM | POA: Diagnosis not present

## 2019-02-03 DIAGNOSIS — F5105 Insomnia due to other mental disorder: Secondary | ICD-10-CM | POA: Diagnosis not present

## 2019-02-03 MED ORDER — TRAZODONE HCL 50 MG PO TABS: 50.0000 mg | ORAL_TABLET | Freq: Every day | ORAL | 4 refills | Status: DC

## 2019-02-03 NOTE — Progress Notes (Addendum)
Virtual Visit via Video Note  I connected with Teresa Crawford on 02/03/19 at 10:00 AM EDT by a video enabled telemedicine application and verified that I am speaking with the correct person using two identifiers.   I discussed the limitations of evaluation and management by telemedicine and the availability of in person appointments. The patient expressed understanding and agreed to proceed.   I discussed the assessment and treatment plan with the patient. The patient was provided an opportunity to ask questions and all were answered. The patient agreed with the plan and demonstrated an understanding of the instructions.   The patient was advised to call back or seek an in-person evaluation if the symptoms worsen or if the condition fails to improve as anticipated.   [place] MD OP Progress Note  02/03/2019 10:29 AM Teresa Crawford  MRN:  [id number]  Chief Complaint:  Chief Complaint    Follow-up     HPI: Jaemarie is a 33 year old Caucasian female who is single, lives in [place], has a history of MDD in remission, PTSD, intellectual disability likely mild, insomnia was evaluated by telemedicine today.  A video call was initiated however due to connection problem it had to be changed to a phone call.  Patient today reports she is currently doing well on the current medication regimen.  Denies any significant sadness or crying spells.  She reports her PTSD symptoms are stable and she does not have any significant intrusive memories or nightmares or flashbacks at this time.  She reports sleep is going well on the trazodone.  She denies any side effects of the trazodone.  She reports she tries to distract herself by keeping herself occupied by doing arts and crafts and also talking to her friends.  She does have support from her friends.  She has not been able to follow-up with her therapist due to the pandemic.  Encouraged her to do so.  She denies any suicidality, homicidality or  perceptual disturbances.  She denies any other concerns today. Visit Diagnosis:    ICD-10-CM   1. MDD (major depressive disorder), recurrent, in full remission ([place])  F33.42   2. Posttraumatic stress disorder  F43.10    stable  3. Insomnia due to mental condition  F51.05 traZODone (DESYREL) 50 MG tablet   stable  4. Intellectual disability  F79    LIKELY MILD    Past Psychiatric History: I have reviewed past psychiatric history from my progress note on 08/16/2018  Past Medical History:  Past Medical History:  Diagnosis Date  . B12 deficiency anemia   . Chronic back pain   . Chronic fatigue syndrome   . Constipation   . Depression   . Elevated liver enzymes   . Endometriosis   . Fibroids   . Hepatic steatosis   . Hypothyroid   . Last menstrual period (LMP) > 10 days ago 09/12/15  . Mental retardation   . Migraine   . Post traumatic stress disorder   . Posttraumatic stress disorder   . PUD (peptic ulcer disease)   . Scoliosis   . Splenomegaly   . Uterine fibroid     Past Surgical History:  Procedure Laterality Date  . ABDOMINAL HYSTERECTOMY    . BACK SURGERY    . COLONOSCOPY  04/27/2006   Wohl-colitis, internal hemorrhoids  . COLONOSCOPY  02/2013   focal right colitis, left colon biopsy negative, focal active proctitis without chronicity  . ESOPHAGOGASTRODUODENOSCOPY  02/2013   multiple peptic & duodenal ulcers,  negative h pylori  . INTRAUTERINE DEVICE INSERTION  2012  . LAPAROSCOPIC ENDOMETRIOSIS FULGURATION  2015   [name]  . OVARIAN CYST REMOVAL      Family Psychiatric History: I have reviewed family psychiatric history from my progress note on 08/16/2018  Family History:  Family History  Problem Relation Age of Onset  . Breast cancer Mother 36  . COPD Father   . Lung cancer Father 65  . Dementia Father   . Diabetes Sister   . Diabetes Brother   . Ovarian cancer Paternal Aunt   . Colon cancer Maternal Grandfather 8  . Liver disease Maternal Aunt         ?etiology  . Lymphoma Maternal Grandmother 60  . Lung cancer Other 6       maternal great grandfather  . Stomach cancer Other 60       maternal great grandmother    Social History: I have reviewed social history from my progress note on 08/16/2018 Social History   Socioeconomic History  . Marital status: Single    Spouse name: Not on file  . Number of children: 0  . Years of education: Not on file  . Highest education level: Not on file  Occupational History  . Not on file  Social Needs  . Financial resource strain: Not hard at all  . Food insecurity    Worry: Never true    Inability: Never true  . Transportation needs    Medical: No    Non-medical: No  Tobacco Use  . Smoking status: Never Smoker  . Smokeless tobacco: Never Used  Substance and Sexual Activity  . Alcohol use: Not Currently    Alcohol/week: 0.0 standard drinks  . Drug use: No  . Sexual activity: Not Currently  Lifestyle  . Physical activity    Days per week: 0 days    Minutes per session: 0 min  . Stress: Not at all  Relationships  . Social Herbalist on phone: Patient refused    Gets together: Patient refused    Attends religious service: Patient refused    Active member of club or organization: Patient refused    Attends meetings of clubs or organizations: Patient refused    Relationship status: Patient refused  Other Topics Concern  . Not on file  Social History Narrative   She has brother, half brother & sister live nearby   Both parents deceased   Care managed by [name]    Allergies:  Allergies  Allergen Reactions  . Other Anaphylaxis    Peanut butter, uses Epi pen  . Peanut Butter Flavor Swelling    Throat swells Throat swells  . Sertraline Other (See Comments)    Panic attacks  . Codeine Nausea And Vomiting    nausea  . Paroxetine Other (See Comments)    Panic Attack   . Paroxetine Hcl Other (See Comments)    Panic attack Other reaction(s): Other (See  Comments) Panic attacks  . Venlafaxine Other (See Comments)    Panic Attack   . Zoloft [Sertraline Hcl] Other (See Comments)    Panic attack    Metabolic Disorder Labs: No results found for: HGBA1C, MPG No results found for: PROLACTIN Lab Results  Component Value Date   CHOL 157 12/09/2018   TRIG 102 12/09/2018   HDL 38 (L) 12/09/2018   LDLCALC 100 (H) 12/09/2018   Lab Results  Component Value Date   TSH 2.740 12/09/2018   TSH  4.710 (H) 12/28/2016    Therapeutic Level Labs: No results found for: LITHIUM No results found for: VALPROATE No components found for:  CBMZ  Current Medications: Current Outpatient Medications  Medication Sig Dispense Refill  . acetaminophen (TYLENOL) 500 MG tablet 2 TABS PO BID PRN FOR HEADACHES AND PAINS 45 tablet 3  . albuterol (PROAIR HFA) 108 (90 Base) MCG/ACT inhaler Inhale 2 puffs into the lungs every 6 (six) hours as needed for wheezing or shortness of breath. 1 Inhaler 3  . albuterol (VENTOLIN HFA) 108 (90 Base) MCG/ACT inhaler Inhale 2 puffs into the lungs every 6 (six) hours as needed for wheezing or shortness of breath. 8 g 1  . cetirizine (ZYRTEC) 10 MG tablet TAKE 1 TABLET BY MOUTH AT BEDTIME FOR ALLERGIES 30 tablet 3  . diazepam (VALIUM) 5 MG tablet INSERT 1 TABLET VAGINALLY ONCE DAILY AT BEDTIME 30 tablet 0  . docusate sodium (COLACE) 100 MG capsule TAKE 1 CAPSULE BY MOUTH TWICE DAILY FOR STOOL SOFTENER 60 capsule 3  . EPINEPHrine 0.3 mg/0.3 mL IJ SOAJ injection USE FOR ANAPHYLAXIS 1 Device 5  . FLOVENT HFA 220 MCG/ACT inhaler INHALE 2 PUFFS INTO THE LUNGS, 2 TIMES PER DAY. RINSE MOUTH AFTER INHALATIONS 12 g 11  . fluticasone (FLONASE) 50 MCG/ACT nasal spray USE 1 SPRAY INTO EACH NOSTRIL ONCE DAILYFOR CONGESTION OR ALLERGIES 16 g 2  . ibuprofen (ADVIL,MOTRIN) 600 MG tablet ONE TAB PO BID PRN FOR PAIN AND HEADACHES 30 tablet 3  . omeprazole (PRILOSEC) 40 MG capsule TAKE 1 CAPSULE BY MOUTH TWICE DAILY 60 capsule 6  .  sulfamethoxazole-trimethoprim (BACTRIM DS) 800-160 MG tablet Take 1 tablet by mouth 2 (two) times daily. 20 tablet 0  . topiramate (TOPAMAX) 50 MG tablet TAKE 1 TABLET BY MOUTH ONCE DAILY IN THEEVENING 30 tablet 3  . traMADol (ULTRAM) 50 MG tablet ONE TAB PO BID PRN FOR PAIN AND HEADCAHES 20 tablet 2  . traZODone (DESYREL) 50 MG tablet Take 1 tablet (50 mg total) by mouth at bedtime. 30 tablet 4   No current facility-administered medications for this visit.      Musculoskeletal: Strength & Muscle Tone: UTA Gait & Station: Reports as WNL Patient leans: N/A  Psychiatric Specialty Exam: Review of Systems  Psychiatric/Behavioral: Negative for depression, hallucinations, substance abuse and suicidal ideas. The patient is not nervous/anxious.   All other systems reviewed and are negative.   Last menstrual period 01/05/2016.There is no height or weight on file to calculate BMI.  General Appearance: UTA  Eye Contact:  UTA  Speech:  Clear and Coherent  Volume:  Normal  Mood:  Euthymic  Affect:  UTA  Thought Process:  Goal Directed and Descriptions of Associations: Intact  Orientation:  Full (Time, Place, and Person)  Thought Content: Logical   Suicidal Thoughts:  No  Homicidal Thoughts:  No  Memory:  Immediate;   Fair Recent;   Fair Remote;   Fair  Judgement:  Fair  Insight:  Fair  Psychomotor Activity:  UTA  Concentration:  Concentration: Fair and Attention Span: Fair  Recall:  [employer] of Knowledge: Fair  Language: Fair  Akathisia:  No  Handed:  Right  AIMS (if indicated): Denies tremors, rigidity  Assets:  Communication Skills Desire for Improvement Housing Social Support  ADL's:  Intact  Cognition: WNL  Sleep:  Fair   Screenings: Mini-Mental     Office Visit from 10/13/2017 in [facility], [facility]  Total Score (max 30 points )  24    PHQ2-9     Clinical Support from 12/07/2018 in [facility], [facility] Office Visit from 08/30/2018 in [facility], [facility] Office Visit from 05/31/2018 in [facility], [facility] Office Visit from 01/11/2018 in [facility], [facility] Office Visit from 10/13/2017 in [facility], [facility]  PHQ-2 Total Score  0  0  0  0  0       Assessment and Plan: Novice is a 33 year old Caucasian female, single, on SSD, has a history of MDD, PTSD, insomnia was evaluated by telemedicine today.  Patient is currently doing well on the current medication regimen.  Plan as noted below.  Plan MDD in remission We will continue to monitor closely  PTSD-stable Patient to continue CBT with Marjie Skiff Continue trazodone 50 mg p.o. nightly  Insomnia-stable Trazodone 50 mg p.o. nightly  Follow-up in clinic in 3 to 4 months or sooner if needed.  January 7 at 10:00 AM  I have spent atleast 15 minutes non face to face with patient today. More than 50 % of the time was spent for psychoeducation and supportive psychotherapy and care coordination. This note was generated in part or whole with voice recognition software. Voice recognition is usually quite accurate but there are transcription errors that can and very often do occur. I apologize for any typographical errors that were not detected and corrected.        Ursula Alert, MD 02/03/2019, 10:29 AM

## 2019-02-14 ENCOUNTER — Other Ambulatory Visit: Payer: Self-pay

## 2019-02-14 ENCOUNTER — Ambulatory Visit (INDEPENDENT_AMBULATORY_CARE_PROVIDER_SITE_OTHER): Payer: Medicare Other | Admitting: Internal Medicine

## 2019-02-14 ENCOUNTER — Encounter: Payer: Self-pay | Admitting: Internal Medicine

## 2019-02-14 DIAGNOSIS — G43109 Migraine with aura, not intractable, without status migrainosus: Secondary | ICD-10-CM | POA: Diagnosis not present

## 2019-02-14 DIAGNOSIS — Z23 Encounter for immunization: Secondary | ICD-10-CM | POA: Diagnosis not present

## 2019-02-14 DIAGNOSIS — F4310 Post-traumatic stress disorder, unspecified: Secondary | ICD-10-CM | POA: Diagnosis not present

## 2019-02-14 DIAGNOSIS — E559 Vitamin D deficiency, unspecified: Secondary | ICD-10-CM

## 2019-02-14 NOTE — Progress Notes (Signed)
[facility] [place], [place] [address]  Internal MEDICINE  Office Visit Note  Patient Name: Teresa Crawford  [id number]  [id number]  Date of Service: 02/14/2019  Chief Complaint  Patient presents with  . Medical Management of Chronic Issues    review labs and [place]     HPI Pt is here with her social worker/ caregiver. Overall feels well. Migraines are under good control. Sleeping well with Trazadone. Pt has MDD and PTSD. Her Asthma is well controlled.Recent Labs and U/S of soft tissue of neck is reviewed with her today. Her Vit D is low.  She participates in CBT as well. Has lost some about 10 lbs  Current Medication: Outpatient Encounter Medications as of 02/14/2019  Medication Sig  . acetaminophen (TYLENOL) 500 MG tablet 2 TABS PO BID PRN FOR HEADACHES AND PAINS  . albuterol (PROAIR HFA) 108 (90 Base) MCG/ACT inhaler Inhale 2 puffs into the lungs every 6 (six) hours as needed for wheezing or shortness of breath.  Marland Kitchen albuterol (VENTOLIN HFA) 108 (90 Base) MCG/ACT inhaler Inhale 2 puffs into the lungs every 6 (six) hours as needed for wheezing or shortness of breath.  . cetirizine (ZYRTEC) 10 MG tablet TAKE 1 TABLET BY MOUTH AT BEDTIME FOR ALLERGIES  . diazepam (VALIUM) 5 MG tablet INSERT 1 TABLET VAGINALLY ONCE DAILY AT BEDTIME  . docusate sodium (COLACE) 100 MG capsule TAKE 1 CAPSULE BY MOUTH TWICE DAILY FOR STOOL SOFTENER  . EPINEPHrine 0.3 mg/0.3 mL IJ SOAJ injection USE FOR ANAPHYLAXIS  . FLOVENT HFA 220 MCG/ACT inhaler INHALE 2 PUFFS INTO THE LUNGS, 2 TIMES PER DAY. RINSE MOUTH AFTER INHALATIONS  . fluticasone (FLONASE) 50 MCG/ACT nasal spray USE 1 SPRAY INTO EACH NOSTRIL ONCE DAILYFOR CONGESTION OR ALLERGIES  . ibuprofen (ADVIL,MOTRIN) 600 MG tablet ONE TAB PO BID PRN FOR PAIN AND HEADACHES  . omeprazole (PRILOSEC) 40 MG capsule TAKE 1 CAPSULE BY MOUTH TWICE DAILY  . topiramate (TOPAMAX) 50 MG tablet TAKE 1 TABLET BY MOUTH ONCE DAILY IN THEEVENING  .  traMADol (ULTRAM) 50 MG tablet ONE TAB PO BID PRN FOR PAIN AND HEADCAHES  . traZODone (DESYREL) 50 MG tablet Take 1 tablet (50 mg total) by mouth at bedtime.  . [DISCONTINUED] sulfamethoxazole-trimethoprim (BACTRIM DS) 800-160 MG tablet Take 1 tablet by mouth 2 (two) times daily. (Patient not taking: Reported on 02/14/2019)   No facility-administered encounter medications on file as of 02/14/2019.     Surgical History: Past Surgical History:  Procedure Laterality Date  . ABDOMINAL HYSTERECTOMY    . BACK SURGERY    . COLONOSCOPY  04/27/2006   Wohl-colitis, internal hemorrhoids  . COLONOSCOPY  02/2013   focal right colitis, left colon biopsy negative, focal active proctitis without chronicity  . ESOPHAGOGASTRODUODENOSCOPY  02/2013   multiple peptic & duodenal ulcers, negative h pylori  . INTRAUTERINE DEVICE INSERTION  2012  . LAPAROSCOPIC ENDOMETRIOSIS FULGURATION  2015   [name]  . OVARIAN CYST REMOVAL      Medical History: Past Medical History:  Diagnosis Date  . B12 deficiency anemia   . Chronic back pain   . Chronic fatigue syndrome   . Constipation   . Depression   . Elevated liver enzymes   . Endometriosis   . Fibroids   . Hepatic steatosis   . Hypothyroid   . Last menstrual period (LMP) > 10 days ago 09/12/15  . Mental retardation   . Migraine   . Post traumatic stress disorder   .  Posttraumatic stress disorder   . PUD (peptic ulcer disease)   . Scoliosis   . Splenomegaly   . Uterine fibroid     Family History: Family History  Problem Relation Age of Onset  . Breast cancer Mother 43  . COPD Father   . Lung cancer Father 29  . Dementia Father   . Diabetes Sister   . Diabetes Brother   . Ovarian cancer Paternal Aunt   . Colon cancer Maternal Grandfather 45  . Liver disease Maternal Aunt        ?etiology  . Lymphoma Maternal Grandmother 60  . Lung cancer Other 48       maternal great grandfather  . Stomach cancer Other 21       maternal great grandmother     Social History   Socioeconomic History  . Marital status: Single    Spouse name: Not on file  . Number of children: 0  . Years of education: Not on file  . Highest education level: Not on file  Occupational History  . Not on file  Social Needs  . Financial resource strain: Not hard at all  . Food insecurity    Worry: Never true    Inability: Never true  . Transportation needs    Medical: No    Non-medical: No  Tobacco Use  . Smoking status: Never Smoker  . Smokeless tobacco: Never Used  Substance and Sexual Activity  . Alcohol use: Not Currently    Alcohol/week: 0.0 standard drinks  . Drug use: No  . Sexual activity: Not Currently  Lifestyle  . Physical activity    Days per week: 0 days    Minutes per session: 0 min  . Stress: Not at all  Relationships  . Social Herbalist on phone: Patient refused    Gets together: Patient refused    Attends religious service: Patient refused    Active member of club or organization: Patient refused    Attends meetings of clubs or organizations: Patient refused    Relationship status: Patient refused  . Intimate partner violence    Fear of current or ex partner: Patient refused    Emotionally abused: Patient refused    Physically abused: Patient refused    Forced sexual activity: Patient refused  Other Topics Concern  . Not on file  Social History Narrative   She has brother, half brother & sister live nearby   Both parents deceased   Care managed by [name]    Review of Systems  Constitutional: Negative for chills, diaphoresis and fatigue.  HENT: Negative for ear pain, postnasal drip and sinus pressure.   Eyes: Negative for photophobia, discharge, redness, itching and visual disturbance.  Respiratory: Negative for cough, shortness of breath and wheezing.   Cardiovascular: Negative for chest pain, palpitations and leg swelling.  Gastrointestinal: Negative for abdominal pain, constipation, diarrhea, nausea  and vomiting.  Genitourinary: Negative for dysuria and flank pain.  Musculoskeletal: Negative for arthralgias, back pain, gait problem and neck pain.  Skin: Negative for color change.  Allergic/Immunologic: Negative for environmental allergies and food allergies.  Neurological: Negative for dizziness and headaches.  Hematological: Does not bruise/bleed easily.  Psychiatric/Behavioral: Negative for agitation, behavioral problems (depression) and hallucinations.    Vital Signs: BP 117/73   Pulse 63   Temp (!) 97.5 F (36.4 C)   Resp 16   Ht 5\' 4"  (1.626 m)   Wt 205 lb (93 kg)   LMP  01/05/2016   SpO2 98%   BMI 35.19 kg/m    Physical Exam Constitutional:      Appearance: Normal appearance.  HENT:     Head: Normocephalic and atraumatic.  Cardiovascular:     Rate and Rhythm: Normal rate and regular rhythm.  Pulmonary:     Effort: Pulmonary effort is normal.     Breath sounds: Normal breath sounds.  Neurological:     General: No focal deficit present.     Mental Status: She is alert and oriented to person, place, and time.  Psychiatric:        Mood and Affect: Mood normal.        Behavior: Behavior normal.    Assessment/Plan:  1. Migraine aura without headache - controlled with Topamax and Imitrex    2. PTSD (post-traumatic stress disorder) - Continue CBT and Psych  3. Vitamin D deficiency - Pt is instructed to take calcium and Vitd OTC   4. Flu vaccine need - Flu Vaccine MDCK QUAD PF  Labs and U/S is dicussed with her and the caregiver, Asthma is under good control    General Counseling: Lakysha verbalizes understanding of the findings of todays visit and agrees with plan of treatment. I have discussed any further diagnostic evaluation that may be needed or ordered today. We also reviewed her medications today. she has been encouraged to call the office with any questions or concerns that should arise related to todays visit.    Orders Placed This Encounter   Procedures  . Flu Vaccine MDCK QUAD PF     Time spent:25 Minutes      Dr Lavera Guise Internal medicine

## 2019-02-25 ENCOUNTER — Other Ambulatory Visit: Payer: Self-pay | Admitting: Internal Medicine

## 2019-03-06 ENCOUNTER — Telehealth: Payer: Self-pay

## 2019-03-06 DIAGNOSIS — F4310 Post-traumatic stress disorder, unspecified: Secondary | ICD-10-CM

## 2019-03-06 DIAGNOSIS — F331 Major depressive disorder, recurrent, moderate: Secondary | ICD-10-CM

## 2019-03-06 MED ORDER — DIAZEPAM 5 MG PO TABS: ORAL_TABLET | ORAL | 2 refills | Status: DC

## 2019-03-06 NOTE — Telephone Encounter (Signed)
rx sent for valium

## 2019-04-13 ENCOUNTER — Other Ambulatory Visit: Payer: Self-pay

## 2019-04-13 ENCOUNTER — Ambulatory Visit (INDEPENDENT_AMBULATORY_CARE_PROVIDER_SITE_OTHER): Payer: Medicare Other | Admitting: Psychiatry

## 2019-04-13 ENCOUNTER — Encounter: Payer: Self-pay | Admitting: Psychiatry

## 2019-04-13 DIAGNOSIS — F4310 Post-traumatic stress disorder, unspecified: Secondary | ICD-10-CM

## 2019-04-13 DIAGNOSIS — F3342 Major depressive disorder, recurrent, in full remission: Secondary | ICD-10-CM | POA: Diagnosis not present

## 2019-04-13 DIAGNOSIS — F5105 Insomnia due to other mental disorder: Secondary | ICD-10-CM | POA: Diagnosis not present

## 2019-04-13 NOTE — Progress Notes (Signed)
Virtual Visit via Video Note  I connected with Teresa Crawford on 04/13/19 at 10:00 AM EST by a video enabled telemedicine application and verified that I am speaking with the correct person using two identifiers.   I discussed the limitations of evaluation and management by telemedicine and the availability of in person appointments. The patient expressed understanding and agreed to proceed.     I discussed the assessment and treatment plan with the patient. The patient was provided an opportunity to ask questions and all were answered. The patient agreed with the plan and demonstrated an understanding of the instructions.   The patient was advised to call back or seek an in-person evaluation if the symptoms worsen or if the condition fails to improve as anticipated.   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  Patient today reports she is currently doing well on the current medication regimen.  She reports she is sleeping well.  She continues to take trazodone.  Patient denies any suicidality, homicidality or perceptual disturbances.  Patient denies any other concerns today. Visit Diagnosis:    ICD-10-CM   1. MDD (major depressive disorder), recurrent, in full remission ([place])  F33.42   2. Posttraumatic stress disorder  F43.10   3. Insomnia due to mental condition  F51.05     Past Psychiatric History: I have reviewed past psychiatric history from my progress note on 08/16/2018.  Past Medical History:  Past Medical History:  Diagnosis Date  . B12 deficiency anemia   . Chronic back pain   . Chronic fatigue syndrome   . Constipation   . Depression   . Elevated liver enzymes   .  Endometriosis   . Fibroids   . Hepatic steatosis   . Hypothyroid   . Last menstrual period (LMP) > 10 days ago 09/12/15  . Mental retardation   . Migraine   . Post traumatic stress disorder   . Posttraumatic stress disorder   . PUD (peptic ulcer disease)   . Scoliosis   . Splenomegaly   . Uterine fibroid     Past Surgical History:  Procedure Laterality Date  . ABDOMINAL HYSTERECTOMY    . BACK SURGERY    . COLONOSCOPY  04/27/2006   Wohl-colitis, internal hemorrhoids  . COLONOSCOPY  02/2013   focal right colitis, left colon biopsy negative, focal active proctitis without chronicity  . ESOPHAGOGASTRODUODENOSCOPY  02/2013   multiple peptic & duodenal ulcers, negative h pylori  . INTRAUTERINE DEVICE INSERTION  2012  . LAPAROSCOPIC ENDOMETRIOSIS FULGURATION  2015   [name]  . OVARIAN CYST REMOVAL      Family Psychiatric History: I have reviewed family psychiatric history from my progress note on 08/16/2018.  Family History:  Family History  Problem Relation Age of Onset  . Breast cancer Mother 32  . COPD Father   . Lung cancer Father 49  . Dementia Father   . Diabetes Sister   . Diabetes Brother   . Ovarian cancer Paternal Aunt   . Colon cancer Maternal Grandfather 13  . Liver disease Maternal Aunt        ?etiology  . Lymphoma Maternal Grandmother 60  . Lung cancer Other 88  maternal great grandfather  . Stomach cancer Other 39       maternal great grandmother    Social History: I have reviewed social history from my progress note on 08/16/2018. Social History   Socioeconomic History  . Marital status: Single    Spouse name: Not on file  . Number of children: 0  . Years of education: Not on file  . Highest education level: Not on file  Occupational History  . Not on file  Tobacco Use  . Smoking status: Never Smoker  . Smokeless tobacco: Never Used  Substance and Sexual Activity  . Alcohol use: Not Currently    Alcohol/week: 0.0 standard drinks  . Drug use:  No  . Sexual activity: Not Currently  Other Topics Concern  . Not on file  Social History Narrative   She has brother, half brother & sister live nearby   Both parents deceased   Care managed by [name]   Social Determinants of Health   Financial Resource Strain:   . Difficulty of Paying Living Expenses: Not on file  Food Insecurity:   . Worried About Charity fundraiser in the Last Year: Not on file  . Ran Out of Food in the Last Year: Not on file  Transportation Needs:   . Lack of Transportation (Medical): Not on file  . Lack of Transportation (Non-Medical): Not on file  Physical Activity:   . Days of Exercise per Week: Not on file  . Minutes of Exercise per Session: Not on file  Stress:   . Feeling of Stress : Not on file  Social Connections:   . Frequency of Communication with Friends and Family: Not on file  . Frequency of Social Gatherings with Friends and Family: Not on file  . Attends Religious Services: Not on file  . Active Member of Clubs or Organizations: Not on file  . Attends Archivist Meetings: Not on file  . Marital Status: Not on file    Allergies:  Allergies  Allergen Reactions  . Other Anaphylaxis    Peanut butter, uses Epi pen  . Peanut Butter Flavor Swelling    Throat swells Throat swells  . Sertraline Other (See Comments)    Panic attacks  . Codeine Nausea And Vomiting    nausea  . Paroxetine Other (See Comments)    Panic Attack   . Paroxetine Hcl Other (See Comments)    Panic attack Other reaction(s): Other (See Comments) Panic attacks  . Venlafaxine Other (See Comments)    Panic Attack   . Zoloft [Sertraline Hcl] Other (See Comments)    Panic attack    Metabolic Disorder Labs: No results found for: HGBA1C, MPG No results found for: PROLACTIN Lab Results  Component Value Date   CHOL 157 12/09/2018   TRIG 102 12/09/2018   HDL 38 (L) 12/09/2018   LDLCALC 100 (H) 12/09/2018   Lab Results  Component Value Date    TSH 2.740 12/09/2018   TSH 4.710 (H) 12/28/2016    Therapeutic Level Labs: No results found for: LITHIUM No results found for: VALPROATE No components found for:  CBMZ  Current Medications: Current Outpatient Medications  Medication Sig Dispense Refill  . acetaminophen (TYLENOL) 500 MG tablet 2 TABS PO BID PRN FOR HEADACHES AND PAINS 45 tablet 3  . albuterol (PROAIR HFA) 108 (90 Base) MCG/ACT inhaler Inhale 2 puffs into the lungs every 6 (six) hours as needed for wheezing or shortness of breath. 1 Inhaler 3  .  albuterol (VENTOLIN HFA) 108 (90 Base) MCG/ACT inhaler Inhale 2 puffs into the lungs every 6 (six) hours as needed for wheezing or shortness of breath. 8 g 1  . cetirizine (ZYRTEC) 10 MG tablet TAKE 1 TABLET BY MOUTH AT BEDTIME FOR ALLERGIES 30 tablet 3  . diazepam (VALIUM) 5 MG tablet Insert one tab intravaginally qhs 30 tablet 2  . docusate sodium (COLACE) 100 MG capsule TAKE 1 CAPSULE BY MOUTH TWICE DAILY FOR STOOL SOFTENER 60 capsule 3  . EPINEPHrine 0.3 mg/0.3 mL IJ SOAJ injection USE FOR ANAPHYLAXIS 1 Device 5  . FLOVENT HFA 220 MCG/ACT inhaler INHALE 2 PUFFS INTO THE LUNGS, 2 TIMES PER DAY. RINSE MOUTH AFTER INHALATIONS 12 g 11  . fluticasone (FLONASE) 50 MCG/ACT nasal spray USE 1 SPRAY INTO EACH NOSTRIL ONCE DAILYFOR CONGESTION OR ALLERGIES 16 g 2  . ibuprofen (ADVIL,MOTRIN) 600 MG tablet ONE TAB PO BID PRN FOR PAIN AND HEADACHES 30 tablet 3  . omeprazole (PRILOSEC) 40 MG capsule TAKE 1 CAPSULE BY MOUTH TWICE DAILY 60 capsule 6  . topiramate (TOPAMAX) 50 MG tablet TAKE 1 TABLET BY MOUTH ONCE DAILY IN THEEVENING 30 tablet 3  . traMADol (ULTRAM) 50 MG tablet ONE TAB PO BID PRN FOR PAIN AND HEADCAHES 20 tablet 2  . traZODone (DESYREL) 50 MG tablet Take 1 tablet (50 mg total) by mouth at bedtime. 30 tablet 4   No current facility-administered medications for this visit.     Musculoskeletal: Strength & Muscle Tone: UTA Gait & Station: normal Patient leans:  N/A  Psychiatric Specialty Exam: Review of Systems  Psychiatric/Behavioral: Negative for agitation, behavioral problems, confusion, decreased concentration, dysphoric mood, hallucinations, self-injury, sleep disturbance and suicidal ideas. The patient is not nervous/anxious and is not hyperactive.   All other systems reviewed and are negative.   Last menstrual period 01/05/2016.There is no height or weight on file to calculate BMI.  General Appearance: Casual  Eye Contact:  Fair  Speech:  Clear and Coherent  Volume:  Normal  Mood:  Euthymic  Affect:  Congruent  Thought Process:  Goal Directed and Descriptions of Associations: Intact  Orientation:  Full (Time, Place, and Person)  Thought Content: Logical   Suicidal Thoughts:  No  Homicidal Thoughts:  No  Memory:  Immediate;   Fair Recent;   Fair Remote;   Fair  Judgement:  Fair  Insight:  Fair  Psychomotor Activity:  Normal  Concentration:  Concentration: Fair and Attention Span: Fair  Recall:  [employer] of Knowledge: Fair  Language: Fair  Akathisia:  No  Handed:  Right  AIMS (if indicated): UTA  Assets:  Communication Skills Desire for Improvement Housing Social Support  ADL's:  Intact  Cognition: WNL  Sleep:  Fair   Screenings: Mini-Mental     Office Visit from 10/13/2017 in [facility], [facility]  Total Score (max 30 points )  27    PHQ2-9     Office Visit from 02/14/2019 in [facility], [place] from 12/07/2018 in [facility], [facility] Office Visit from 08/30/2018 in [facility], [facility] Office Visit from 05/31/2018 in [facility], [facility] Office Visit from 01/11/2018 in [facility], [facility]  PHQ-2 Total Score  0  0  0  0  0       Assessment and Plan: Teresa Crawford is a 34 year old Caucasian female, single, on SSD, has a history of MDD, PTSD, insomnia was evaluated by telemedicine today.  Patient is currently doing well  on the current medication regimen.   Plan as noted below.  Plan MDD in remission We will continue to monitor closely  PTSD-stable Continue psychotherapy sessions with therapist Marjie Skiff. Trazodone 50 mg p.o. nightly  Insomnia-stable Trazodone 50 mg p.o. nightly  Follow-up in clinic in 3 to 4 months or sooner if needed.  April 29 at 10 AM  I have spent atleast 15 minutes non face to face with patient today. More than 50 % of the time was spent for  obtaining and to review and separately obtained history , ordering medications and test ,psychoeducation and supportive psychotherapy and care coordination,as well as documenting clinical information in electronic health record. This note was generated in part or whole with voice recognition software. Voice recognition is usually quite accurate but there are transcription errors that can and very often do occur. I apologize for any typographical errors that were not detected and corrected.       Ursula Alert, MD 04/13/2019, 12:51 PM

## 2019-04-24 ENCOUNTER — Other Ambulatory Visit: Payer: Self-pay

## 2019-04-24 MED ORDER — DOCUSATE SODIUM 100 MG PO CAPS: ORAL_CAPSULE | ORAL | 3 refills | Status: DC

## 2019-05-25 ENCOUNTER — Other Ambulatory Visit: Payer: Self-pay

## 2019-05-25 MED ORDER — CETIRIZINE HCL 10 MG PO TABS: ORAL_TABLET | ORAL | 3 refills | Status: DC

## 2019-05-25 MED ORDER — TOPIRAMATE 50 MG PO TABS: ORAL_TABLET | ORAL | 3 refills | Status: DC

## 2019-06-02 ENCOUNTER — Other Ambulatory Visit: Payer: Self-pay | Admitting: Nurse Practitioner

## 2019-06-02 DIAGNOSIS — F331 Major depressive disorder, recurrent, moderate: Secondary | ICD-10-CM

## 2019-06-02 DIAGNOSIS — F4310 Post-traumatic stress disorder, unspecified: Secondary | ICD-10-CM

## 2019-06-02 MED ORDER — DIAZEPAM 5 MG PO TABS: ORAL_TABLET | ORAL | 2 refills | Status: DC

## 2019-06-02 NOTE — Progress Notes (Signed)
Approved valium prescription per pharmacy request.

## 2019-06-09 ENCOUNTER — Telehealth: Payer: Self-pay

## 2019-06-09 NOTE — Telephone Encounter (Signed)
Confirmed appointment on 06/13/2019 and screened for covid. [facility] 

## 2019-06-09 NOTE — Telephone Encounter (Signed)
LMOM FOR PATIENT TO CONFIRM AND SCREEN FOR 06-13-19 OV. 

## 2019-06-13 ENCOUNTER — Encounter: Payer: Self-pay | Admitting: Nurse Practitioner

## 2019-06-13 ENCOUNTER — Other Ambulatory Visit: Payer: Self-pay

## 2019-06-13 ENCOUNTER — Ambulatory Visit (INDEPENDENT_AMBULATORY_CARE_PROVIDER_SITE_OTHER): Payer: Medicare Other | Admitting: Nurse Practitioner

## 2019-06-13 VITALS — BP 120/80 | HR 76 | Temp 97.1°F | Resp 16 | Ht 64.0 in | Wt 211.0 lb

## 2019-06-13 DIAGNOSIS — J301 Allergic rhinitis due to pollen: Secondary | ICD-10-CM

## 2019-06-13 DIAGNOSIS — G43009 Migraine without aura, not intractable, without status migrainosus: Secondary | ICD-10-CM

## 2019-06-13 DIAGNOSIS — F411 Generalized anxiety disorder: Secondary | ICD-10-CM

## 2019-06-13 MED ORDER — IBUPROFEN 600 MG PO TABS: ORAL_TABLET | ORAL | 3 refills | Status: DC

## 2019-06-13 MED ORDER — TRAMADOL HCL 50 MG PO TABS: ORAL_TABLET | ORAL | 2 refills | Status: DC

## 2019-06-13 MED ORDER — FLUTICASONE PROPIONATE 50 MCG/ACT NA SUSP: NASAL | 3 refills | Status: DC

## 2019-06-13 NOTE — Progress Notes (Signed)
[facility] [place], [place] [address]  Internal MEDICINE  Office Visit Note  Patient Name: Teresa Crawford  [id number]  [id number]  Date of Service: 06/17/2019  Chief Complaint  Patient presents with  . Anemia  . Anxiety  . Depression  . Hypothyroidism    The patient is here for routine follow up.  She states that she has recently had increase in allergic rhinitis type symptoms. She has also had single episode of migraine headache. Did take previously prescribed sumatriptan. She took it as she forgot that she had negative side effects from this medication. States that this made her migraine headache worse. In the past, she has taken ibuprofen and tramadol if headache does not go away with ibuprofen. She needs to have refills for these medications today. She states that she is doing well. She has no other concerns or complaints.       Current Medication: Outpatient Encounter Medications as of 06/13/2019  Medication Sig  . acetaminophen (TYLENOL) 500 MG tablet 2 TABS PO BID PRN FOR HEADACHES AND PAINS  . albuterol (PROAIR HFA) 108 (90 Base) MCG/ACT inhaler Inhale 2 puffs into the lungs every 6 (six) hours as needed for wheezing or shortness of breath.  Marland Kitchen albuterol (VENTOLIN HFA) 108 (90 Base) MCG/ACT inhaler Inhale 2 puffs into the lungs every 6 (six) hours as needed for wheezing or shortness of breath.  . cetirizine (ZYRTEC) 10 MG tablet TAKE 1 TABLET BY MOUTH AT BEDTIME FOR ALLERGIES  . diazepam (VALIUM) 5 MG tablet Insert one tab intravaginally qhs  . docusate sodium (COLACE) 100 MG capsule TAKE 1 CAPSULE BY MOUTH TWICE DAILY FOR STOOL SOFTENER  . EPINEPHrine 0.3 mg/0.3 mL IJ SOAJ injection USE FOR ANAPHYLAXIS  . FLOVENT HFA 220 MCG/ACT inhaler INHALE 2 PUFFS INTO THE LUNGS, 2 TIMES PER DAY. RINSE MOUTH AFTER INHALATIONS  . fluticasone (FLONASE) 50 MCG/ACT nasal spray USE 1 SPRAY INTO EACH NOSTRIL ONCE DAILYFOR CONGESTION OR ALLERGIES  . ibuprofen (ADVIL)  600 MG tablet ONE TAB PO BID PRN FOR PAIN AND HEADACHES  . omeprazole (PRILOSEC) 40 MG capsule TAKE 1 CAPSULE BY MOUTH TWICE DAILY  . topiramate (TOPAMAX) 50 MG tablet Take 1 tab po every evening  . traMADol (ULTRAM) 50 MG tablet ONE TAB PO BID PRN FOR PAIN AND HEADCAHES  . traZODone (DESYREL) 50 MG tablet Take 1 tablet (50 mg total) by mouth at bedtime.  . [DISCONTINUED] fluticasone (FLONASE) 50 MCG/ACT nasal spray USE 1 SPRAY INTO EACH NOSTRIL ONCE DAILYFOR CONGESTION OR ALLERGIES  . [DISCONTINUED] ibuprofen (ADVIL,MOTRIN) 600 MG tablet ONE TAB PO BID PRN FOR PAIN AND HEADACHES  . [DISCONTINUED] traMADol (ULTRAM) 50 MG tablet ONE TAB PO BID PRN FOR PAIN AND HEADCAHES   No facility-administered encounter medications on file as of 06/13/2019.    Surgical History: Past Surgical History:  Procedure Laterality Date  . ABDOMINAL HYSTERECTOMY    . BACK SURGERY    . COLONOSCOPY  04/27/2006   Wohl-colitis, internal hemorrhoids  . COLONOSCOPY  02/2013   focal right colitis, left colon biopsy negative, focal active proctitis without chronicity  . ESOPHAGOGASTRODUODENOSCOPY  02/2013   multiple peptic & duodenal ulcers, negative h pylori  . INTRAUTERINE DEVICE INSERTION  2012  . LAPAROSCOPIC ENDOMETRIOSIS FULGURATION  2015   [name]  . OVARIAN CYST REMOVAL      Medical History: Past Medical History:  Diagnosis Date  . B12 deficiency anemia   . Chronic back pain   . Chronic  fatigue syndrome   . Constipation   . Depression   . Elevated liver enzymes   . Endometriosis   . Fibroids   . Hepatic steatosis   . Hypothyroid   . Last menstrual period (LMP) > 10 days ago 09/12/15  . Mental retardation   . Migraine   . Post traumatic stress disorder   . Posttraumatic stress disorder   . PUD (peptic ulcer disease)   . Scoliosis   . Splenomegaly   . Uterine fibroid     Family History: Family History  Problem Relation Age of Onset  . Breast cancer Mother 41  . COPD Father   . Lung cancer  Father 63  . Dementia Father   . Diabetes Sister   . Diabetes Brother   . Ovarian cancer Paternal Aunt   . Colon cancer Maternal Grandfather 48  . Liver disease Maternal Aunt        ?etiology  . Lymphoma Maternal Grandmother 60  . Lung cancer Other 32       maternal great grandfather  . Stomach cancer Other 26       maternal great grandmother    Social History   Socioeconomic History  . Marital status: Single    Spouse name: Not on file  . Number of children: 0  . Years of education: Not on file  . Highest education level: Not on file  Occupational History  . Not on file  Tobacco Use  . Smoking status: Never Smoker  . Smokeless tobacco: Never Used  Substance and Sexual Activity  . Alcohol use: Not Currently    Alcohol/week: 0.0 standard drinks  . Drug use: No  . Sexual activity: Not Currently  Other Topics Concern  . Not on file  Social History Narrative   She has brother, half brother & sister live nearby   Both parents deceased   Care managed by [name]   Social Determinants of Health   Financial Resource Strain:   . Difficulty of Paying Living Expenses:   Food Insecurity:   . Worried About Charity fundraiser in the Last Year:   . Arboriculturist in the Last Year:   Transportation Needs:   . Film/video editor (Medical):   Marland Kitchen Lack of Transportation (Non-Medical):   Physical Activity:   . Days of Exercise per Week:   . Minutes of Exercise per Session:   Stress:   . Feeling of Stress :   Social Connections:   . Frequency of Communication with Friends and Family:   . Frequency of Social Gatherings with Friends and Family:   . Attends Religious Services:   . Active Member of Clubs or Organizations:   . Attends Archivist Meetings:   Marland Kitchen Marital Status:   Intimate Partner Violence:   . Fear of Current or Ex-Partner:   . Emotionally Abused:   Marland Kitchen Physically Abused:   . Sexually Abused:       Review of Systems  Constitutional: Negative  for activity change, chills, fatigue and unexpected weight change.  HENT: Negative for congestion, postnasal drip, rhinorrhea, sneezing and sore throat.   Respiratory: Negative for cough, chest tightness, shortness of breath and wheezing.   Cardiovascular: Negative for chest pain and palpitations.  Gastrointestinal: Negative for abdominal pain, constipation, diarrhea, nausea and vomiting.  Endocrine: Negative for cold intolerance, heat intolerance, polydipsia and polyuria.  Musculoskeletal: Negative for arthralgias, back pain, joint swelling and neck pain.  Skin: Negative for rash.  Allergic/Immunologic: Negative for environmental allergies.  Neurological: Positive for headaches. Negative for dizziness, tremors and numbness.       History of seizure disorder and migraine headaches  Hematological: Negative for adenopathy. Does not bruise/bleed easily.  Psychiatric/Behavioral: Negative for behavioral problems (Depression), sleep disturbance and suicidal ideas. The patient is nervous/anxious.     Today's Vitals   06/13/19 1125  BP: 120/80  Pulse: 76  Resp: 16  Temp: (!) 97.1 F (36.2 C)  SpO2: 99%  Weight: 211 lb (95.7 kg)  Height: 5\' 4"  (1.626 m)   Body mass index is 36.22 kg/m.  Physical Exam Vitals and nursing note reviewed.  Constitutional:      General: She is not in acute distress.    Appearance: Normal appearance. She is well-developed. She is not diaphoretic.  HENT:     Head: Normocephalic and atraumatic.     Nose: Congestion present.     Mouth/Throat:     Pharynx: No oropharyngeal exudate.  Eyes:     Pupils: Pupils are equal, round, and reactive to light.  Neck:     Thyroid: No thyromegaly.     Vascular: No JVD.     Trachea: No tracheal deviation.  Cardiovascular:     Rate and Rhythm: Normal rate and regular rhythm.     Heart sounds: Normal heart sounds. No murmur. No friction rub. No gallop.   Pulmonary:     Effort: Pulmonary effort is normal. No respiratory  distress.     Breath sounds: Normal breath sounds. No wheezing or rales.  Chest:     Chest wall: No tenderness.  Abdominal:     Palpations: Abdomen is soft.  Musculoskeletal:        General: Normal range of motion.     Cervical back: Normal range of motion and neck supple.  Lymphadenopathy:     Cervical: No cervical adenopathy.  Skin:    General: Skin is warm and dry.  Neurological:     Mental Status: She is alert and oriented to person, place, and time. Mental status is at baseline.     Cranial Nerves: No cranial nerve deficit.  Psychiatric:        Mood and Affect: Mood normal.        Behavior: Behavior normal.        Thought Content: Thought content normal.        Judgment: Judgment normal.    Assessment/Plan: 1. Migraine without aura, not intractable, without status migrainosus May take ibuprofen 600mg  twice daily when needed for migraine headaches. If ineffective, may take tramadol 50mg  twice daily. Refills for both were sent to her pharmacy. She should conitnue to take topamax daily to prevent migraine headaches  - traMADol (ULTRAM) 50 MG tablet; ONE TAB PO BID PRN FOR PAIN AND HEADCAHES  Dispense: 20 tablet; Refill: 2 - ibuprofen (ADVIL) 600 MG tablet; ONE TAB PO BID PRN FOR PAIN AND HEADACHES  Dispense: 30 tablet; Refill: 3  2. Allergic rhinitis due to pollen, unspecified seasonality Renew flonase nasal spray. Use 1 spray in both nostrils daily to reduce nasal congestion.  - fluticasone (FLONASE) 50 MCG/ACT nasal spray; USE 1 SPRAY INTO EACH NOSTRIL ONCE DAILYFOR CONGESTION OR ALLERGIES  Dispense: 48 g; Refill: 3  3. Generalized anxiety disorder continue trazodone as needed and as prescribed for anxiety/insomnia.   General Counseling: algene hollander understanding of the findings of todays visit and agrees with plan of treatment. I have discussed any further diagnostic evaluation that may be needed  or ordered today. We also reviewed her medications today. she has been  encouraged to call the office with any questions or concerns that should arise related to todays visit.   This patient was seen by [place] with Dr Lavera Guise as a part of collaborative care agreement  Meds ordered this encounter  Medications  . fluticasone (FLONASE) 50 MCG/ACT nasal spray    Sig: USE 1 SPRAY INTO EACH NOSTRIL ONCE DAILYFOR CONGESTION OR ALLERGIES    Dispense:  48 g    Refill:  3    Order Specific Question:   Supervising Provider    Answer:   Lavera Guise [place]  . traMADol (ULTRAM) 50 MG tablet    Sig: ONE TAB PO BID PRN FOR PAIN AND HEADCAHES    Dispense:  20 tablet    Refill:  2    Order Specific Question:   Supervising Provider    Answer:   Lavera Guise [place]  . ibuprofen (ADVIL) 600 MG tablet    Sig: ONE TAB PO BID PRN FOR PAIN AND HEADACHES    Dispense:  30 tablet    Refill:  3    Order Specific Question:   Supervising Provider    Answer:   Lavera Guise [id number]    Total time spent: 20 Minutes  Time spent includes review of chart, medications, test results, and follow up plan with the patient.      Dr Lavera Guise Internal medicine

## 2019-06-17 DIAGNOSIS — F411 Generalized anxiety disorder: Secondary | ICD-10-CM | POA: Insufficient documentation

## 2019-06-17 DIAGNOSIS — J301 Allergic rhinitis due to pollen: Secondary | ICD-10-CM | POA: Insufficient documentation

## 2019-07-20 ENCOUNTER — Other Ambulatory Visit: Payer: Self-pay | Admitting: Psychiatry

## 2019-07-20 ENCOUNTER — Other Ambulatory Visit: Payer: Self-pay

## 2019-07-20 DIAGNOSIS — F5105 Insomnia due to other mental disorder: Secondary | ICD-10-CM

## 2019-07-20 MED ORDER — DOCUSATE SODIUM 100 MG PO CAPS: ORAL_CAPSULE | ORAL | 3 refills | Status: DC

## 2019-07-31 ENCOUNTER — Other Ambulatory Visit: Payer: Self-pay

## 2019-07-31 DIAGNOSIS — F331 Major depressive disorder, recurrent, moderate: Secondary | ICD-10-CM

## 2019-07-31 DIAGNOSIS — F4310 Post-traumatic stress disorder, unspecified: Secondary | ICD-10-CM

## 2019-07-31 MED ORDER — DIAZEPAM 5 MG PO TABS: ORAL_TABLET | ORAL | 2 refills | Status: DC

## 2019-08-03 ENCOUNTER — Encounter: Payer: Self-pay | Admitting: Psychiatry

## 2019-08-03 ENCOUNTER — Telehealth (INDEPENDENT_AMBULATORY_CARE_PROVIDER_SITE_OTHER): Payer: Medicare Other | Admitting: Psychiatry

## 2019-08-03 ENCOUNTER — Other Ambulatory Visit: Payer: Self-pay

## 2019-08-03 DIAGNOSIS — F5105 Insomnia due to other mental disorder: Secondary | ICD-10-CM

## 2019-08-03 DIAGNOSIS — F3342 Major depressive disorder, recurrent, in full remission: Secondary | ICD-10-CM | POA: Diagnosis not present

## 2019-08-03 DIAGNOSIS — F4310 Post-traumatic stress disorder, unspecified: Secondary | ICD-10-CM

## 2019-08-03 NOTE — Progress Notes (Signed)
Provider Location : [facility] Patient Location : Home  Virtual Visit via Video Note  I connected with Teresa Crawford on 08/03/19 at 10:00 AM EDT by a video enabled telemedicine application and verified that I am speaking with the correct person using two identifiers.   I discussed the limitations of evaluation and management by telemedicine and the availability of in person appointments. The patient expressed understanding and agreed to proceed.    I discussed the assessment and treatment plan with the patient. The patient was provided an opportunity to ask questions and all were answered. The patient agreed with the plan and demonstrated an understanding of the instructions.   The patient was advised to call back or seek an in-person evaluation if the symptoms worsen or if the condition fails to improve as anticipated.  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  Patient today reports she is currently stable on current medication regimen.  She reports sleep is excessive at times.  However overall she is doing okay.  She continues to take trazodone.  Patient denies any suicidality, homicidality or perceptual disturbances.  She reports she looks forward to starting a part-time job somewhere.  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  Patient denies any other concerns today. Visit Diagnosis:    ICD-10-CM   1. MDD (major depressive disorder), recurrent, in full remission (Teresa Crawford)  F33.42   2. Posttraumatic stress disorder  F43.10   3. Insomnia due to mental condition  F51.05     Past Psychiatric History: I have reviewed past psychiatric history from my progress note on  08/16/2018.  Past Medical History:  Past Medical History:  Diagnosis Date  . B12 deficiency anemia   . Chronic back pain   . Chronic fatigue syndrome   . Constipation   . Depression   . Elevated liver enzymes   . Endometriosis   . Fibroids   . Hepatic steatosis   . Hypothyroid   . Last menstrual period (LMP) > 10 days ago 09/12/15  . Mental retardation   . Migraine   . Post traumatic stress disorder   . Posttraumatic stress disorder   . PUD (peptic ulcer disease)   . Scoliosis   . Splenomegaly   . Uterine fibroid     Past Surgical History:  Procedure Laterality Date  . ABDOMINAL HYSTERECTOMY    . BACK SURGERY    . COLONOSCOPY  04/27/2006   Wohl-colitis, internal hemorrhoids  . COLONOSCOPY  02/2013   focal right colitis, left colon biopsy negative, focal active proctitis without chronicity  . ESOPHAGOGASTRODUODENOSCOPY  02/2013   multiple peptic & duodenal ulcers, negative h pylori  . INTRAUTERINE DEVICE INSERTION  2012  . LAPAROSCOPIC ENDOMETRIOSIS FULGURATION  2015   [name]  . OVARIAN CYST REMOVAL      Family Psychiatric History: I have reviewed family psychiatric history from my progress note on 08/16/2018.  Family History:  Family History  Problem Relation Age of Onset  . Breast cancer Mother 39  . COPD Father   . Lung cancer Father 74  . Dementia Father   . Diabetes Sister   . Diabetes Brother   . Ovarian  cancer Paternal Aunt   . Colon cancer Maternal Grandfather 38  . Liver disease Maternal Aunt        ?etiology  . Lymphoma Maternal Grandmother 60  . Lung cancer Other 28       maternal great grandfather  . Stomach cancer Other 69       maternal great grandmother    Social History: I have reviewed social history from my progress note on 08/16/2018. Social History   Socioeconomic History  . Marital status: Single    Spouse name: Not on file  . Number of children: 0  . Years of education: Not on file  . Highest education level: Not on file   Occupational History  . Not on file  Tobacco Use  . Smoking status: Never Smoker  . Smokeless tobacco: Never Used  Substance and Sexual Activity  . Alcohol use: Not Currently    Alcohol/week: 0.0 standard drinks  . Drug use: No  . Sexual activity: Not Currently  Other Topics Concern  . Not on file  Social History Narrative   She has brother, half brother & sister live nearby   Both parents deceased   Care managed by [name]   Social Determinants of Health   Financial Resource Strain:   . Difficulty of Paying Living Expenses:   Food Insecurity:   . Worried About Charity fundraiser in the Last Year:   . Arboriculturist in the Last Year:   Transportation Needs:   . Film/video editor (Medical):   Marland Kitchen Lack of Transportation (Non-Medical):   Physical Activity:   . Days of Exercise per Week:   . Minutes of Exercise per Session:   Stress:   . Feeling of Stress :   Social Connections:   . Frequency of Communication with Friends and Family:   . Frequency of Social Gatherings with Friends and Family:   . Attends Religious Services:   . Active Member of Clubs or Organizations:   . Attends Archivist Meetings:   Marland Kitchen Marital Status:     Allergies:  Allergies  Allergen Reactions  . Other Anaphylaxis    Peanut butter, uses Epi pen  . Peanut Butter Flavor Swelling    Throat swells Throat swells  . Sertraline Other (See Comments)    Panic attacks  . Codeine Nausea And Vomiting    nausea  . Paroxetine Other (See Comments)    Panic Attack   . Paroxetine Hcl Other (See Comments)    Panic attack Other reaction(s): Other (See Comments) Panic attacks  . Venlafaxine Other (See Comments)    Panic Attack   . Zoloft [Sertraline Hcl] Other (See Comments)    Panic attack    Metabolic Disorder Labs: No results found for: HGBA1C, MPG No results found for: PROLACTIN Lab Results  Component Value Date   CHOL 157 12/09/2018   TRIG 102 12/09/2018   HDL 38 (L)  12/09/2018   LDLCALC 100 (H) 12/09/2018   Lab Results  Component Value Date   TSH 2.740 12/09/2018   TSH 4.710 (H) 12/28/2016    Therapeutic Level Labs: No results found for: LITHIUM No results found for: VALPROATE No components found for:  CBMZ  Current Medications: Current Outpatient Medications  Medication Sig Dispense Refill  . acetaminophen (TYLENOL) 500 MG tablet 2 TABS PO BID PRN FOR HEADACHES AND PAINS 45 tablet 3  . albuterol (PROAIR HFA) 108 (90 Base) MCG/ACT inhaler Inhale 2 puffs into the lungs  every 6 (six) hours as needed for wheezing or shortness of breath. 1 Inhaler 3  . albuterol (VENTOLIN HFA) 108 (90 Base) MCG/ACT inhaler Inhale 2 puffs into the lungs every 6 (six) hours as needed for wheezing or shortness of breath. 8 g 1  . cetirizine (ZYRTEC) 10 MG tablet TAKE 1 TABLET BY MOUTH AT BEDTIME FOR ALLERGIES 30 tablet 3  . diazepam (VALIUM) 5 MG tablet Insert one tab intravaginally qhs 30 tablet 2  . docusate sodium (COLACE) 100 MG capsule TAKE 1 CAPSULE BY MOUTH TWICE DAILY FOR STOOL SOFTENER 60 capsule 3  . EPINEPHrine 0.3 mg/0.3 mL IJ SOAJ injection USE FOR ANAPHYLAXIS 1 Device 5  . FLOVENT HFA 220 MCG/ACT inhaler INHALE 2 PUFFS INTO THE LUNGS, 2 TIMES PER DAY. RINSE MOUTH AFTER INHALATIONS 12 g 11  . fluticasone (FLONASE) 50 MCG/ACT nasal spray USE 1 SPRAY INTO EACH NOSTRIL ONCE DAILYFOR CONGESTION OR ALLERGIES 48 g 3  . ibuprofen (ADVIL) 600 MG tablet ONE TAB PO BID PRN FOR PAIN AND HEADACHES 30 tablet 3  . omeprazole (PRILOSEC) 40 MG capsule TAKE 1 CAPSULE BY MOUTH TWICE DAILY 60 capsule 6  . topiramate (TOPAMAX) 50 MG tablet Take 1 tab po every evening 30 tablet 3  . traMADol (ULTRAM) 50 MG tablet ONE TAB PO BID PRN FOR PAIN AND HEADCAHES 20 tablet 2  . traZODone (DESYREL) 50 MG tablet TAKE 1 TABLET BY MOUTH AT BEDTIME 30 tablet 4   No current facility-administered medications for this visit.     Musculoskeletal: Strength & Muscle Tone: UTA Gait & Station:  normal Patient leans: N/A  Psychiatric Specialty Exam: Review of Systems  Psychiatric/Behavioral: Positive for sleep disturbance (Excessive at times). Negative for agitation, behavioral problems, confusion, dysphoric mood, hallucinations, self-injury and suicidal ideas. The patient is not nervous/anxious and is not hyperactive.   All other systems reviewed and are negative.   Last menstrual period 01/05/2016.There is no height or weight on file to calculate BMI.  General Appearance: Casual  Eye Contact:  Fair  Speech:  Clear and Coherent  Volume:  Normal  Mood:  Euthymic  Affect:  Congruent  Thought Process:  Goal Directed and Descriptions of Associations: Intact  Orientation:  Full (Time, Place, and Person)  Thought Content: Logical   Suicidal Thoughts:  No  Homicidal Thoughts:  No  Memory:  Immediate;   Fair Recent;   Fair Remote;   Fair  Judgement:  Fair  Insight:  Fair  Psychomotor Activity:  Normal  Concentration:  Concentration: Fair and Attention Span: Fair  Recall:  [employer] of Knowledge: Fair  Language: Fair  Akathisia:  No  Handed:  Right  AIMS (if indicated): UTA  Assets:  Communication Skills Desire for Improvement Housing Social Support  ADL's:  Intact  Cognition: WNL  Sleep:  overall ok,, Excessive at times   Screenings: Mini-Mental     [facility] Visit from 10/13/2017 in [facility], [facility]  Total Score (max 30 points )  27    PHQ2-9     Office Visit from 06/13/2019 in [facility], [facility] Office Visit from 02/14/2019 in [facility], [place] from 12/07/2018 in [facility], [facility] Office Visit from 08/30/2018 in [facility], [facility] Office Visit from 05/31/2018 in [facility], [facility]  PHQ-2 Total Score  0  0  0  0  0       Assessment and Plan: Kaneesha is a 34 year old Caucasian female, single, on SSD,  has a history of MDD, PTSD, insomnia was evaluated by telemedicine today.  Patient is  currently stable except for excessive sleep at times.  Discussed plan as noted below.  Plan MDD in remission We will monitor closely  PTSD-stable Continue psychotherapy sessions with therapist Ms. Marjie Skiff. Trazodone 50 mg p.o. nightly  Insomnia- excessive sleep at times. Discussed with patient to use the trazodone as needed or take half tablet of 25 mg at bedtime if she has excessive sleep.  Follow-up in clinic in 3 months or sooner if needed.  I have spent atleast 15 minutes non face to face with patient today. More than 50 % of the time was spent for preparing to see the patient ( e.g., review of test, records ), ordering medications and test ,psychoeducation and supportive psychotherapy and care coordination,as well as documenting clinical information in electronic health record. This note was generated in part or whole with voice recognition software. Voice recognition is usually quite accurate but there are transcription errors that can and very often do occur. I apologize for any typographical errors that were not detected and corrected.       Teresa Alert, MD 08/03/2019, 6:24 PM

## 2019-08-19 ENCOUNTER — Other Ambulatory Visit: Payer: Self-pay | Admitting: Internal Medicine

## 2019-08-21 ENCOUNTER — Other Ambulatory Visit: Payer: Self-pay

## 2019-08-21 MED ORDER — CETIRIZINE HCL 10 MG PO TABS: ORAL_TABLET | ORAL | 3 refills | Status: DC

## 2019-09-12 ENCOUNTER — Other Ambulatory Visit: Payer: Self-pay

## 2019-09-12 ENCOUNTER — Ambulatory Visit (INDEPENDENT_AMBULATORY_CARE_PROVIDER_SITE_OTHER): Payer: Medicare Other | Admitting: Internal Medicine

## 2019-09-12 ENCOUNTER — Encounter: Payer: Self-pay | Admitting: Adult Health

## 2019-09-12 DIAGNOSIS — M545 Low back pain, unspecified: Secondary | ICD-10-CM

## 2019-09-12 DIAGNOSIS — F331 Major depressive disorder, recurrent, moderate: Secondary | ICD-10-CM | POA: Diagnosis not present

## 2019-09-12 DIAGNOSIS — F411 Generalized anxiety disorder: Secondary | ICD-10-CM | POA: Diagnosis not present

## 2019-09-12 MED ORDER — CHLORZOXAZONE 500 MG PO TABS: ORAL_TABLET | ORAL | 0 refills | Status: DC

## 2019-09-12 NOTE — Progress Notes (Signed)
[facility] [place], [place] [address]  Internal MEDICINE  Office Visit Note  Patient Name: Teresa Crawford  [id number]  [id number]  Date of Service: 09/19/2019  Chief Complaint  Patient presents with   Back Pain    upper back pain below neck   Anxiety   Depression   HPI Pt is here with her Education officer, museum. 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   She has gained weight  Current Medication: Outpatient Encounter Medications as of 09/12/2019  Medication Sig   acetaminophen (TYLENOL) 500 MG tablet 2 TABS PO BID PRN FOR HEADACHES AND PAINS   albuterol (PROAIR HFA) 108 (90 Base) MCG/ACT inhaler Inhale 2 puffs into the lungs every 6 (six) hours as needed for wheezing or shortness of breath.   cetirizine (ZYRTEC) 10 MG tablet TAKE 1 TABLET BY MOUTH AT BEDTIME FOR ALLERGIES   diazepam (VALIUM) 5 MG tablet Insert one tab intravaginally qhs   docusate sodium (COLACE) 100 MG capsule TAKE 1 CAPSULE BY MOUTH TWICE DAILY FOR STOOL SOFTENER   EPINEPHrine 0.3 mg/0.3 mL IJ SOAJ injection USE FOR ANAPHYLAXIS   FLOVENT HFA 220 MCG/ACT inhaler INHALE 2 PUFFS INTO THE LUNGS, 2 TIMES PER DAY. RINSE MOUTH AFTER INHALATIONS   fluticasone (FLONASE) 50 MCG/ACT nasal spray USE 1 SPRAY INTO EACH NOSTRIL ONCE DAILYFOR CONGESTION OR ALLERGIES   ibuprofen (ADVIL) 600 MG tablet ONE TAB PO BID PRN FOR PAIN AND HEADACHES   omeprazole (PRILOSEC) 40 MG capsule TAKE 1 CAPSULE BY MOUTH TWICE DAILY   topiramate (TOPAMAX) 50 MG tablet TAKE 1 TABLET BY MOUTH EVERY EVENING   traMADol (ULTRAM) 50 MG tablet ONE TAB PO BID PRN FOR PAIN AND HEADCAHES   traZODone (DESYREL) 50 MG tablet TAKE 1 TABLET BY MOUTH AT BEDTIME   [DISCONTINUED] albuterol  (VENTOLIN HFA) 108 (90 Base) MCG/ACT inhaler Inhale 2 puffs into the lungs every 6 (six) hours as needed for wheezing or shortness of breath.   chlorzoxazone (PARAFON FORTE DSC) 500 MG tablet Take one tab po bid for back spasm   No facility-administered encounter medications on file as of 09/12/2019.    Surgical History: Past Surgical History:  Procedure Laterality Date   ABDOMINAL HYSTERECTOMY     BACK SURGERY     COLONOSCOPY  04/27/2006   Wohl-colitis, internal hemorrhoids   COLONOSCOPY  02/2013   focal right colitis, left colon biopsy negative, focal active proctitis without chronicity   ESOPHAGOGASTRODUODENOSCOPY  02/2013   multiple peptic & duodenal ulcers, negative h pylori   INTRAUTERINE DEVICE INSERTION  2012   LAPAROSCOPIC ENDOMETRIOSIS FULGURATION  2015   [name]   OVARIAN CYST REMOVAL      Medical History: Past Medical History:  Diagnosis Date   B12 deficiency anemia    Chronic back pain    Chronic fatigue syndrome    Constipation    Depression    Elevated liver enzymes    Endometriosis    Fibroids    Hepatic steatosis    Hypothyroid    Last menstrual period (LMP) > 10 days ago 09/12/15   Mental retardation    Migraine    Post traumatic stress disorder    Posttraumatic stress disorder  PUD (peptic ulcer disease)    Scoliosis    Splenomegaly    Uterine fibroid     Family History: Family History  Problem Relation Age of Onset   Breast cancer Mother 81   COPD Father    Lung cancer Father 56   Dementia Father    Diabetes Sister    Diabetes Brother    Ovarian cancer Paternal Aunt    Colon cancer Maternal Grandfather 2   Liver disease Maternal Aunt        ?etiology   Lymphoma Maternal Grandmother 58   Lung cancer Other 9       maternal great grandfather   Stomach cancer Other 78       maternal great grandmother    Social History   Socioeconomic History   Marital status: Single    Spouse name: Not on file    Number of children: 0   Years of education: Not on file   Highest education level: Not on file  Occupational History   Not on file  Tobacco Use   Smoking status: Never Smoker   Smokeless tobacco: Never Used  Vaping Use   Vaping Use: Never used  Substance and Sexual Activity   Alcohol use: Not Currently    Alcohol/week: 0.0 standard drinks   Drug use: No   Sexual activity: Not Currently  Other Topics Concern   Not on file  Social History Narrative   She has brother, half brother & sister live nearby   Both parents deceased   Care managed by [name]   Social Determinants of Health   Financial Resource Strain:    Difficulty of Paying Living Expenses:   Food Insecurity:    Worried About Charity fundraiser in the Last Year:    Arboriculturist in the Last Year:   Transportation Needs:    Film/video editor (Medical):    Lack of Transportation (Non-Medical):   Physical Activity:    Days of Exercise per Week:    Minutes of Exercise per Session:   Stress:    Feeling of Stress :   Social Connections:    Frequency of Communication with Friends and Family:    Frequency of Social Gatherings with Friends and Family:    Attends Religious Services:    Active Member of Clubs or Organizations:    Attends Music therapist:    Marital Status:   Intimate Partner Violence:    Fear of Current or Ex-Partner:    Emotionally Abused:    Physically Abused:    Sexually Abused:     Review of Systems  Constitutional: Negative for chills, diaphoresis and fatigue.  HENT: Negative for ear pain, postnasal drip and sinus pressure.   Eyes: Negative for photophobia, discharge, redness, itching and visual disturbance.  Respiratory: Negative for cough, shortness of breath and wheezing.   Cardiovascular: Negative for chest pain, palpitations and leg swelling.  Gastrointestinal: Negative for abdominal pain, constipation, diarrhea, nausea and  vomiting.  Genitourinary: Negative for dysuria and flank pain.  Musculoskeletal: Negative for arthralgias, back pain, gait problem and neck pain.  Skin: Negative for color change.  Allergic/Immunologic: Negative for environmental allergies and food allergies.  Neurological: Negative for dizziness and headaches.  Hematological: Does not bruise/bleed easily.  Psychiatric/Behavioral: Negative for agitation, behavioral problems (depression) and hallucinations.    Vital Signs: BP (!) 102/50    Pulse 69    Temp (!) 97 F (36.1 C)    Resp  16    Ht 5\' 4"  (1.626 m)    Wt 209 lb 6.4 oz (95 kg)    LMP 01/05/2016    SpO2 98%    BMI 35.94 kg/m    Physical Exam Constitutional:      Appearance: Normal appearance.  HENT:     Head: Normocephalic and atraumatic.     Mouth/Throat:     Mouth: Mucous membranes are moist.  Eyes:     Extraocular Movements: Extraocular movements intact.     Pupils: Pupils are equal, round, and reactive to light.  Pulmonary:     Effort: Pulmonary effort is normal.     Breath sounds: Wheezing present.  Musculoskeletal:        General: Tenderness present.  Neurological:     General: No focal deficit present.     Mental Status: She is alert and oriented to person, place, and time.    Assessment/Plan: 1. Acute bilateral low back pain without sciatica - Pt was instructed to take time off from work or not work at all. She does have tramadol at home - Ambulatory referral to Orthopedic Surgery - chlorzoxazone (PARAFON FORTE DSC) 500 MG tablet; Take one tab po bid for back spasm  Dispense: 30 tablet; Refill: 0  2. Major depressive disorder, recurrent episode, moderate (HCC) - Stable and controlled with meds   3. Generalized anxiety disorder - Stable and controlled with meds  General Counseling: allyssa abruzzese understanding of the findings of todays visit and agrees with plan of treatment. I have discussed any further diagnostic evaluation that may be needed or  ordered today. We also reviewed her medications today. she has been encouraged to call the office with any questions or concerns that should arise related to todays visit.  Orders Placed This Encounter  Procedures   Ambulatory referral to Orthopedic Surgery   Meds ordered this encounter  Medications   chlorzoxazone (PARAFON FORTE DSC) 500 MG tablet    Sig: Take one tab po bid for back spasm    Dispense:  30 tablet    Refill:  0    Total time spent: 30 Minutes Time spent includes review of chart, medications, test results, and follow up plan with the patient.   Dr Lavera Guise Internal medicine

## 2019-09-27 ENCOUNTER — Other Ambulatory Visit: Payer: Self-pay | Admitting: Internal Medicine

## 2019-09-27 DIAGNOSIS — M545 Low back pain, unspecified: Secondary | ICD-10-CM

## 2019-10-05 DIAGNOSIS — M542 Cervicalgia: Secondary | ICD-10-CM | POA: Diagnosis not present

## 2019-10-05 DIAGNOSIS — G8929 Other chronic pain: Secondary | ICD-10-CM | POA: Diagnosis not present

## 2019-10-05 DIAGNOSIS — M546 Pain in thoracic spine: Secondary | ICD-10-CM | POA: Diagnosis not present

## 2019-10-05 DIAGNOSIS — M5416 Radiculopathy, lumbar region: Secondary | ICD-10-CM | POA: Diagnosis not present

## 2019-10-10 ENCOUNTER — Ambulatory Visit: Payer: Medicare Other | Admitting: Nurse Practitioner

## 2019-10-23 ENCOUNTER — Encounter: Payer: Self-pay | Admitting: Nurse Practitioner

## 2019-10-23 ENCOUNTER — Ambulatory Visit (INDEPENDENT_AMBULATORY_CARE_PROVIDER_SITE_OTHER): Payer: Medicare Other | Admitting: Adult Health

## 2019-10-23 ENCOUNTER — Other Ambulatory Visit: Payer: Self-pay

## 2019-10-23 VITALS — Resp 16 | Ht 64.0 in

## 2019-10-23 DIAGNOSIS — R059 Cough, unspecified: Secondary | ICD-10-CM

## 2019-10-23 DIAGNOSIS — R05 Cough: Secondary | ICD-10-CM

## 2019-10-23 DIAGNOSIS — T7840XD Allergy, unspecified, subsequent encounter: Secondary | ICD-10-CM

## 2019-10-23 MED ORDER — GUAIFENESIN 100 MG/5ML PO SOLN: 5.0000 mL | ORAL | 0 refills | Status: DC | PRN

## 2019-10-23 MED ORDER — BENZONATATE 100 MG PO CAPS: 100.0000 mg | ORAL_CAPSULE | Freq: Three times a day (TID) | ORAL | 0 refills | Status: DC | PRN

## 2019-10-23 NOTE — Progress Notes (Signed)
[facility] [place], [place] [address]  Internal MEDICINE  Telephone Visit  Patient Name: Teresa Crawford  [id number]  [id number]  Date of Service: 10/23/2019  I connected with the patient at 416 by telephone and verified the patients identity using two identifiers.   I discussed the limitations, risks, security and privacy concerns of performing an evaluation and management service by telephone and the availability of in person appointments. I also discussed with the patient that there may be a patient responsible charge related to the service.  The patient expressed understanding and agrees to proceed.    Chief Complaint  Patient presents with  . Telephone Assessment  . Telephone Screen  . Nasal Congestion    coughing a lot - causing chest to hurt, throat is sore, sneezing    HPI  Pt is seen via telephone.   She reports on Friday she started sneezing, and coughing.  She reports now she has a sore throat.  She is having difficulty keeping food down.  She is able to drink some fluids.  She feels like this is her allergies flaring up. She is taking zyrtec with no relief. She was vaccinated for covid with last vaccine being in march 2021. She is using zofran as needed.        Current Medication: Outpatient Encounter Medications as of 10/23/2019  Medication Sig  . acetaminophen (TYLENOL) 500 MG tablet 2 TABS PO BID PRN FOR HEADACHES AND PAINS  . albuterol (PROAIR HFA) 108 (90 Base) MCG/ACT inhaler Inhale 2 puffs into the lungs every 6 (six) hours as needed for wheezing or shortness of breath.  . cetirizine (ZYRTEC) 10 MG tablet TAKE 1 TABLET BY MOUTH AT BEDTIME FOR ALLERGIES  . chlorzoxazone (PARAFON FORTE DSC) 500 MG tablet Take one tab po bid for back spasm  . diazepam (VALIUM) 5 MG tablet Insert one tab intravaginally qhs  . docusate sodium (COLACE) 100 MG capsule TAKE 1 CAPSULE BY MOUTH TWICE DAILY FOR STOOL SOFTENER  . EPINEPHrine 0.3 mg/0.3 mL IJ SOAJ  injection USE FOR ANAPHYLAXIS  . FLOVENT HFA 220 MCG/ACT inhaler INHALE 2 PUFFS INTO THE LUNGS, 2 TIMES PER DAY. RINSE MOUTH AFTER INHALATIONS  . fluticasone (FLONASE) 50 MCG/ACT nasal spray USE 1 SPRAY INTO EACH NOSTRIL ONCE DAILYFOR CONGESTION OR ALLERGIES  . ibuprofen (ADVIL) 600 MG tablet ONE TAB PO BID PRN FOR PAIN AND HEADACHES  . omeprazole (PRILOSEC) 40 MG capsule TAKE 1 CAPSULE BY MOUTH TWICE DAILY  . topiramate (TOPAMAX) 50 MG tablet TAKE 1 TABLET BY MOUTH EVERY EVENING  . traMADol (ULTRAM) 50 MG tablet ONE TAB PO BID PRN FOR PAIN AND HEADCAHES  . traZODone (DESYREL) 50 MG tablet TAKE 1 TABLET BY MOUTH AT BEDTIME  . benzonatate (TESSALON PERLES) 100 MG capsule Take 1 capsule (100 mg total) by mouth 3 (three) times daily as needed for cough.  Marland Kitchen guaiFENesin (ROBITUSSIN) 100 MG/5ML SOLN Take 5 mLs (100 mg total) by mouth every 4 (four) hours as needed for cough or to loosen phlegm.   No facility-administered encounter medications on file as of 10/23/2019.    Surgical History: Past Surgical History:  Procedure Laterality Date  . ABDOMINAL HYSTERECTOMY    . BACK SURGERY    . COLONOSCOPY  04/27/2006   Wohl-colitis, internal hemorrhoids  . COLONOSCOPY  02/2013   focal right colitis, left colon biopsy negative, focal active proctitis without chronicity  . ESOPHAGOGASTRODUODENOSCOPY  02/2013   multiple peptic & duodenal ulcers, negative  h pylori  . INTRAUTERINE DEVICE INSERTION  2012  . LAPAROSCOPIC ENDOMETRIOSIS FULGURATION  2015   [name]  . OVARIAN CYST REMOVAL      Medical History: Past Medical History:  Diagnosis Date  . B12 deficiency anemia   . Chronic back pain   . Chronic fatigue syndrome   . Constipation   . Depression   . Elevated liver enzymes   . Endometriosis   . Fibroids   . Hepatic steatosis   . Hypothyroid   . Last menstrual period (LMP) > 10 days ago 09/12/15  . Mental retardation   . Migraine   . Post traumatic stress disorder   . Posttraumatic stress  disorder   . PUD (peptic ulcer disease)   . Scoliosis   . Splenomegaly   . Uterine fibroid     Family History: Family History  Problem Relation Age of Onset  . Breast cancer Mother 73  . COPD Father   . Lung cancer Father 22  . Dementia Father   . Diabetes Sister   . Diabetes Brother   . Ovarian cancer Paternal Aunt   . Colon cancer Maternal Grandfather 79  . Liver disease Maternal Aunt        ?etiology  . Lymphoma Maternal Grandmother 60  . Lung cancer Other 67       maternal great grandfather  . Stomach cancer Other 41       maternal great grandmother    Social History   Socioeconomic History  . Marital status: Single    Spouse name: Not on file  . Number of children: 0  . Years of education: Not on file  . Highest education level: Not on file  Occupational History  . Not on file  Tobacco Use  . Smoking status: Never Smoker  . Smokeless tobacco: Never Used  Vaping Use  . Vaping Use: Never used  Substance and Sexual Activity  . Alcohol use: Not Currently    Alcohol/week: 0.0 standard drinks  . Drug use: No  . Sexual activity: Not Currently  Other Topics Concern  . Not on file  Social History Narrative   She has brother, half brother & sister live nearby   Both parents deceased   Care managed by [name]   Social Determinants of Health   Financial Resource Strain:   . Difficulty of Paying Living Expenses:   Food Insecurity:   . Worried About Charity fundraiser in the Last Year:   . Arboriculturist in the Last Year:   Transportation Needs:   . Film/video editor (Medical):   Marland Kitchen Lack of Transportation (Non-Medical):   Physical Activity:   . Days of Exercise per Week:   . Minutes of Exercise per Session:   Stress:   . Feeling of Stress :   Social Connections:   . Frequency of Communication with Friends and Family:   . Frequency of Social Gatherings with Friends and Family:   . Attends Religious Services:   . Active Member of Clubs or  Organizations:   . Attends Archivist Meetings:   Marland Kitchen Marital Status:   Intimate Partner Violence:   . Fear of Current or Ex-Partner:   . Emotionally Abused:   Marland Kitchen Physically Abused:   . Sexually Abused:       Review of Systems  Constitutional: Negative for chills, fatigue and unexpected weight change.  HENT: Positive for congestion, postnasal drip, rhinorrhea, sinus pressure, sinus pain, sore throat  and voice change. Negative for sneezing.   Eyes: Negative for photophobia, pain and redness.  Respiratory: Positive for cough and shortness of breath. Negative for chest tightness.   Cardiovascular: Negative for chest pain and palpitations.  Gastrointestinal: Negative for abdominal pain, constipation, diarrhea, nausea and vomiting.  Endocrine: Negative.   Genitourinary: Negative for dysuria and frequency.  Musculoskeletal: Negative for arthralgias, back pain, joint swelling and neck pain.  Skin: Negative for rash.  Allergic/Immunologic: Negative.   Neurological: Negative for tremors and numbness.  Hematological: Negative for adenopathy. Does not bruise/bleed easily.  Psychiatric/Behavioral: Negative for behavioral problems and sleep disturbance. The patient is not nervous/anxious.     Vital Signs: Resp 16   Ht 5\' 4"  (1.626 m)   LMP 01/05/2016   BMI 35.94 kg/m    Observation/Objective: Congested sounding.    Assessment/Plan: 1. Cough Use cough medications as discussed - benzonatate (TESSALON PERLES) 100 MG capsule; Take 1 capsule (100 mg total) by mouth 3 (three) times daily as needed for cough.  Dispense: 20 capsule; Refill: 0 - guaiFENesin (ROBITUSSIN) 100 MG/5ML SOLN; Take 5 mLs (100 mg total) by mouth every 4 (four) hours as needed for cough or to loosen phlegm.  Dispense: 180 mL; Refill: 0  2. Allergy, subsequent encounter Continue to take allergy medications, and use Flonase.  If no improvement, patient may need CXR and steroid course.   General Counseling:  ruben pyka understanding of the findings of today's phone visit and agrees with plan of treatment. I have discussed any further diagnostic evaluation that may be needed or ordered today. We also reviewed her medications today. she has been encouraged to call the office with any questions or concerns that should arise related to todays visit.    No orders of the defined types were placed in this encounter.   Meds ordered this encounter  Medications  . benzonatate (TESSALON PERLES) 100 MG capsule    Sig: Take 1 capsule (100 mg total) by mouth 3 (three) times daily as needed for cough.    Dispense:  20 capsule    Refill:  0  . guaiFENesin (ROBITUSSIN) 100 MG/5ML SOLN    Sig: Take 5 mLs (100 mg total) by mouth every 4 (four) hours as needed for cough or to loosen phlegm.    Dispense:  180 mL    Refill:  0    Time spent: Owen AGNP-C Internal medicine

## 2019-10-27 ENCOUNTER — Other Ambulatory Visit: Payer: Self-pay | Admitting: Internal Medicine

## 2019-10-27 DIAGNOSIS — F331 Major depressive disorder, recurrent, moderate: Secondary | ICD-10-CM

## 2019-10-27 DIAGNOSIS — F4310 Post-traumatic stress disorder, unspecified: Secondary | ICD-10-CM

## 2019-10-27 NOTE — Telephone Encounter (Signed)
Last visit 10/23/19 video for sick visit, next well visit 12/19/19.

## 2019-11-02 ENCOUNTER — Telehealth (INDEPENDENT_AMBULATORY_CARE_PROVIDER_SITE_OTHER): Payer: Medicare Other | Admitting: Psychiatry

## 2019-11-02 ENCOUNTER — Encounter: Payer: Self-pay | Admitting: Psychiatry

## 2019-11-02 ENCOUNTER — Other Ambulatory Visit: Payer: Self-pay

## 2019-11-02 DIAGNOSIS — F5105 Insomnia due to other mental disorder: Secondary | ICD-10-CM

## 2019-11-02 DIAGNOSIS — F3342 Major depressive disorder, recurrent, in full remission: Secondary | ICD-10-CM

## 2019-11-02 DIAGNOSIS — Z634 Disappearance and death of family member: Secondary | ICD-10-CM

## 2019-11-02 DIAGNOSIS — F4310 Post-traumatic stress disorder, unspecified: Secondary | ICD-10-CM

## 2019-11-02 MED ORDER — TRAZODONE HCL 50 MG PO TABS: 25.0000 mg | ORAL_TABLET | Freq: Every day | ORAL | 2 refills | Status: DC

## 2019-11-02 MED ORDER — HYDROXYZINE HCL 10 MG PO TABS: 10.0000 mg | ORAL_TABLET | Freq: Two times a day (BID) | ORAL | 1 refills | Status: DC | PRN

## 2019-11-02 NOTE — Progress Notes (Signed)
Provider Location : [facility] Patient Location : Home  Virtual Visit via Video Note  I connected with Teresa Crawford on 11/02/19 at 10:00 AM EDT by a video enabled telemedicine application and verified that I am speaking with the correct person using two identifiers.   I discussed the limitations of evaluation and management by telemedicine and the availability of in person appointments. The patient expressed understanding and agreed to proceed.   I discussed the assessment and treatment plan with the patient. The patient was provided an opportunity to ask questions and all were answered. The patient agreed with the plan and demonstrated an understanding of the instructions.   The patient was advised to call back or seek an in-person evaluation if the symptoms worsen or if the condition fails to improve as anticipated. [place] MD OP Progress Note  11/02/2019 8:13 PM Teresa Crawford  MRN:  [id number]  Chief Complaint:  Chief Complaint    Follow-up     HPI: Teresa Crawford is a 34 year old Caucasian female who is single, lives in [place], has a history of MDD in remission, PTSD, intellectual disability, mild, insomnia was evaluated by telemedicine today.  Patient reports her uncle recently passed away.  She reports he had COVID-19 infection.  She reports she is trying to cope with the loss at this time.  She does feel sad often however is coping okay.  She reports sleep is good on the trazodone.  She has been taking only half tablet of the 50 mg and that helps.  Patient reports she is not interested in finding a new therapist since she does have a therapist and is trying to reach back to her.   Patient denies any suicidality, homicidality or perceptual disturbances.  She reports she did have an upper respiratory infection and is currently feeling better.  Patient denies any other concerns today.  Visit Diagnosis:    ICD-10-CM   1. MDD (major depressive disorder), recurrent, in full  remission ([place])  F33.42 traZODone (DESYREL) 50 MG tablet  2. Posttraumatic stress disorder  F43.10 traZODone (DESYREL) 50 MG tablet  3. Insomnia due to mental condition  F51.05 traZODone (DESYREL) 50 MG tablet   stable  4. Bereavement  Z63.4 traZODone (DESYREL) 50 MG tablet    hydrOXYzine (ATARAX/VISTARIL) 10 MG tablet    Past Psychiatric History: I have reviewed past psychiatric history from my progress note on 08/16/2018  Past Medical History:  Past Medical History:  Diagnosis Date  . B12 deficiency anemia   . Chronic back pain   . Chronic fatigue syndrome   . Constipation   . Depression   . Elevated liver enzymes   . Endometriosis   . Fibroids   . Hepatic steatosis   . Hypothyroid   . Last menstrual period (LMP) > 10 days ago 09/12/15  . Mental retardation   . Migraine   . Post traumatic stress disorder   . Posttraumatic stress disorder   . PUD (peptic ulcer disease)   . Scoliosis   . Splenomegaly   . Uterine fibroid     Past Surgical History:  Procedure Laterality Date  . ABDOMINAL HYSTERECTOMY    . BACK SURGERY    . COLONOSCOPY  04/27/2006   Wohl-colitis, internal hemorrhoids  . COLONOSCOPY  02/2013   focal right colitis, left colon biopsy negative, focal active proctitis without chronicity  . ESOPHAGOGASTRODUODENOSCOPY  02/2013   multiple peptic & duodenal ulcers, negative h pylori  . INTRAUTERINE DEVICE INSERTION  2012  .  LAPAROSCOPIC ENDOMETRIOSIS FULGURATION  2015   [name]  . OVARIAN CYST REMOVAL      Family Psychiatric History: I have reviewed family psychiatric history from my progress note on 08/16/2018  Family History:  Family History  Problem Relation Age of Onset  . Breast cancer Mother 20  . COPD Father   . Lung cancer Father 54  . Dementia Father   . Diabetes Sister   . Diabetes Brother   . Ovarian cancer Paternal Aunt   . Colon cancer Maternal Grandfather 66  . Liver disease Maternal Aunt        ?etiology  . Lymphoma Maternal Grandmother 60   . Lung cancer Other 20       maternal great grandfather  . Stomach cancer Other 60       maternal great grandmother    Social History: I have reviewed social history from my progress note on 08/16/2018 Social History   Socioeconomic History  . Marital status: Single    Spouse name: Not on file  . Number of children: 0  . Years of education: Not on file  . Highest education level: Not on file  Occupational History  . Not on file  Tobacco Use  . Smoking status: Never Smoker  . Smokeless tobacco: Never Used  Vaping Use  . Vaping Use: Never used  Substance and Sexual Activity  . Alcohol use: Not Currently    Alcohol/week: 0.0 standard drinks  . Drug use: No  . Sexual activity: Not Currently  Other Topics Concern  . Not on file  Social History Narrative   She has brother, half brother & sister live nearby   Both parents deceased   Care managed by [name]   Social Determinants of Health   Financial Resource Strain:   . Difficulty of Paying Living Expenses:   Food Insecurity:   . Worried About Charity fundraiser in the Last Year:   . Arboriculturist in the Last Year:   Transportation Needs:   . Film/video editor (Medical):   Marland Kitchen Lack of Transportation (Non-Medical):   Physical Activity:   . Days of Exercise per Week:   . Minutes of Exercise per Session:   Stress:   . Feeling of Stress :   Social Connections:   . Frequency of Communication with Friends and Family:   . Frequency of Social Gatherings with Friends and Family:   . Attends Religious Services:   . Active Member of Clubs or Organizations:   . Attends Archivist Meetings:   Marland Kitchen Marital Status:     Allergies:  Allergies  Allergen Reactions  . Other Anaphylaxis    Peanut butter, uses Epi pen  . Peanut Butter Flavor Swelling    Throat swells Throat swells  . Sertraline Other (See Comments)    Panic attacks  . Codeine Nausea And Vomiting    nausea  . Paroxetine Other (See Comments)     Panic Attack   . Paroxetine Hcl Other (See Comments)    Panic attack Other reaction(s): Other (See Comments) Panic attacks  . Venlafaxine Other (See Comments)    Panic Attack   . Zoloft [Sertraline Hcl] Other (See Comments)    Panic attack    Metabolic Disorder Labs: No results found for: HGBA1C, MPG No results found for: PROLACTIN Lab Results  Component Value Date   CHOL 157 12/09/2018   TRIG 102 12/09/2018   HDL 38 (L) 12/09/2018   LDLCALC 100 (  H) 12/09/2018   Lab Results  Component Value Date   TSH 2.740 12/09/2018   TSH 4.710 (H) 12/28/2016    Therapeutic Level Labs: No results found for: LITHIUM No results found for: VALPROATE No components found for:  CBMZ  Current Medications: Current Outpatient Medications  Medication Sig Dispense Refill  . acetaminophen (TYLENOL) 500 MG tablet 2 TABS PO BID PRN FOR HEADACHES AND PAINS 45 tablet 3  . albuterol (PROAIR HFA) 108 (90 Base) MCG/ACT inhaler Inhale 2 puffs into the lungs every 6 (six) hours as needed for wheezing or shortness of breath. 1 Inhaler 3  . benzonatate (TESSALON PERLES) 100 MG capsule Take 1 capsule (100 mg total) by mouth 3 (three) times daily as needed for cough. 20 capsule 0  . cetirizine (ZYRTEC) 10 MG tablet TAKE 1 TABLET BY MOUTH AT BEDTIME FOR ALLERGIES 30 tablet 3  . chlorzoxazone (PARAFON FORTE DSC) 500 MG tablet Take one tab po bid for back spasm 30 tablet 0  . diazepam (VALIUM) 5 MG tablet INSERT 1 TABLET VAGINALLY ONCE DAILY AT BEDTIME 30 tablet 3  . docusate sodium (COLACE) 100 MG capsule TAKE 1 CAPSULE BY MOUTH TWICE DAILY FOR STOOL SOFTENER 60 capsule 3  . EPINEPHrine 0.3 mg/0.3 mL IJ SOAJ injection USE FOR ANAPHYLAXIS 1 Device 5  . FLOVENT HFA 220 MCG/ACT inhaler INHALE 2 PUFFS INTO THE LUNGS, 2 TIMES PER DAY. RINSE MOUTH AFTER INHALATIONS 12 g 11  . fluticasone (FLONASE) 50 MCG/ACT nasal spray USE 1 SPRAY INTO EACH NOSTRIL ONCE DAILYFOR CONGESTION OR ALLERGIES 48 g 3  . guaiFENesin  (ROBITUSSIN) 100 MG/5ML SOLN Take 5 mLs (100 mg total) by mouth every 4 (four) hours as needed for cough or to loosen phlegm. 180 mL 0  . hydrOXYzine (ATARAX/VISTARIL) 10 MG tablet Take 1 tablet (10 mg total) by mouth 2 (two) times daily as needed for anxiety. 60 tablet 1  . ibuprofen (ADVIL) 600 MG tablet ONE TAB PO BID PRN FOR PAIN AND HEADACHES 30 tablet 3  . omeprazole (PRILOSEC) 40 MG capsule TAKE 1 CAPSULE BY MOUTH TWICE DAILY 60 capsule 6  . topiramate (TOPAMAX) 50 MG tablet TAKE 1 TABLET BY MOUTH EVERY EVENING 30 tablet 3  . traMADol (ULTRAM) 50 MG tablet ONE TAB PO BID PRN FOR PAIN AND HEADCAHES 20 tablet 2  . traZODone (DESYREL) 50 MG tablet Take 0.5 tablets (25 mg total) by mouth at bedtime. For sleep 15 tablet 2   No current facility-administered medications for this visit.     Musculoskeletal: Strength & Muscle Tone: UTA Gait & Station: normal Patient leans: N/A  Psychiatric Specialty Exam: Review of Systems  HENT: Positive for congestion.   Respiratory: Positive for cough.   Psychiatric/Behavioral: Positive for dysphoric mood. The patient is nervous/anxious.   All other systems reviewed and are negative.   Last menstrual period 01/05/2016.There is no height or weight on file to calculate BMI.  General Appearance: Casual  Eye Contact:  Fair  Speech:  Clear and Coherent  Volume:  Normal  Mood:  Anxious and Dysphoric improving, due to recent loss of uncle  Affect:  Congruent  Thought Process:  Goal Directed and Descriptions of Associations: Intact  Orientation:  Full (Time, Place, and Person)  Thought Content: Logical   Suicidal Thoughts:  No  Homicidal Thoughts:  No  Memory:  Immediate;   Fair Recent;   Fair Remote;   Fair  Judgement:  Fair  Insight:  Fair  Psychomotor Activity:  Normal  Concentration:  Concentration: Fair and Attention Span: Fair  Recall:  [employer] of Knowledge: Fair  Language: Fair  Akathisia:  No  Handed:  Right  AIMS (if indicated):  UTA  Assets:  Communication Skills Desire for Improvement Housing Social Support  ADL's:  Intact  Cognition: WNL  Sleep:  Fair   Screenings: Mini-Mental     [facility] Visit from 10/13/2017 in [facility], [facility]  Total Score (max 30 points ) 27    PHQ2-9     Office Visit from 06/13/2019 in [facility], [facility] Office Visit from 02/14/2019 in [facility] [place] from 12/07/2018 in [facility] Office Visit from 08/30/2018 in [facility], [facility] Office Visit from 05/31/2018 in [facility], [facility]  PHQ-2 Total Score 0 0 0 0 0       Assessment and Plan: Teresa Crawford is a 34 year old Caucasian female, single, on SSD, has a history of MDD, PTSD, insomnia was evaluated by telemedicine today.  Patient is currently struggling with the loss of her uncle who passed away recently.  Patient is grieving his loss.  She however reports she is overall coping okay and will reach out to her therapist for counseling.  Plan as noted below.  Plan MDD in remission We will monitor closely  PTSD-stable Continue psychotherapy with her therapist Ms. Marjie Skiff. Continue trazodone at 25 mg p.o. nightly.  She has been taking only 1/2 tablet of the 50 mg.  Insomnia-stable Continue trazodone as needed.  Bereavement-unstable Provided grief counseling. Patient will reach out to her therapist and continue counseling.  Patient will reach out to her primary care provider if her upper respiratory infection does not get better.  Patient encouraged to do so.  Follow-up in clinic in 6 weeks or sooner if needed.  I have spent atleast  20 minutes non face to face with patient today. More than 50 % of the time was spent for preparing to see the patient ( e.g., review of test, records ), ordering medications and test ,psychoeducation and supportive psychotherapy and care coordination,as well as documenting clinical information in  electronic health record. This note was generated in part or whole with voice recognition software. Voice recognition is usually quite accurate but there are transcription errors that can and very often do occur. I apologize for any typographical errors that were not detected and corrected.        Ursula Alert, MD 11/02/2019, 8:13 PM

## 2019-11-07 ENCOUNTER — Other Ambulatory Visit: Payer: Self-pay

## 2019-11-07 ENCOUNTER — Ambulatory Visit (INDEPENDENT_AMBULATORY_CARE_PROVIDER_SITE_OTHER): Payer: Medicare Other | Admitting: Licensed Clinical Social Worker

## 2019-11-07 DIAGNOSIS — Z634 Disappearance and death of family member: Secondary | ICD-10-CM

## 2019-11-07 DIAGNOSIS — F4310 Post-traumatic stress disorder, unspecified: Secondary | ICD-10-CM

## 2019-11-07 NOTE — Progress Notes (Signed)
Virtual Visit via Video Note  I connected with Teresa Crawford on 11/07/19 at 10:00 AM EDT by a video enabled telemedicine application and verified that I am speaking with the correct person using two identifiers.  Location: Patient: home Provider: [facility]   I discussed the limitations of evaluation and management by telemedicine and the availability of in person appointments. The patient expressed understanding and agreed to proceed.   I discussed the assessment and treatment plan with the patient. The patient was provided an opportunity to ask questions and all were answered. The patient agreed with the plan and demonstrated an understanding of the instructions.   The patient was advised to call back or seek an in-person evaluation if the symptoms worsen or if the condition fails to improve as anticipated.  I provided 60 minutes of non-face-to-face time during this encounter.   Teresa Crawford Teresa Misa Fedorko, LCSW    THERAPIST PROGRESS NOTE  Session Time: 10:00-10:57 am  Participation Level: Active  Behavioral Response: Neat and Well GroomedAlertAnxious and Depressed  Type of Therapy: Individual Therapy  Treatment Goals addressed: Coping  Interventions: Supportive  Summary: Teresa Crawford is a 34 y.o. female who presents with symptoms related to her diagnosis. Pt reports depression that has been triggered by the recent death of uncle. Pt very close with uncle--only family member that was currently living in [place].  Allowed pt to express thoughts and feelings associated with grief, relationships with friends/family, and past traumatic events.   Suicidal/Homicidal: No  Therapist Response: Teresa Crawford is experiencing fluctuating/intermittent progress due to recent death of family member.  LCSW counselor will continue supporting pt through grief process and will continue to assist pt with identifying healthy coping mechanisms and continuing to support and maintain overall positive psychiatric  care.   Plan: Return again in 3 weeks.  Diagnosis: Axis I: Bereavement and Post Traumatic Stress Disorder    Axis II: No diagnosis    Teresa Crawford Teresa Roedel, LCSW 11/07/2019

## 2019-11-17 ENCOUNTER — Other Ambulatory Visit: Payer: Self-pay | Admitting: Internal Medicine

## 2019-11-28 ENCOUNTER — Other Ambulatory Visit: Payer: Self-pay

## 2019-11-28 ENCOUNTER — Ambulatory Visit (INDEPENDENT_AMBULATORY_CARE_PROVIDER_SITE_OTHER): Payer: Medicare Other | Admitting: Licensed Clinical Social Worker

## 2019-11-28 DIAGNOSIS — Z634 Disappearance and death of family member: Secondary | ICD-10-CM

## 2019-11-28 DIAGNOSIS — F4310 Post-traumatic stress disorder, unspecified: Secondary | ICD-10-CM

## 2019-11-28 NOTE — Progress Notes (Signed)
Virtual Visit via Video Note  I connected with Teresa Crawford on 11/28/19 at 12:30 PM EDT by a video enabled telemedicine application and verified that I am speaking with the correct person using two identifiers. Attempted video connection x 2 unsuccessfully.  Location: Patient: home Provider: [facility]   I discussed the limitations of evaluation and management by telemedicine and the availability of in person appointments. The patient expressed understanding and agreed to proceed.   The patient was advised to call back or seek an in-person evaluation if the symptoms worsen or if the condition fails to improve as anticipated.  I provided 60 minutes of non-face-to-face time during this encounter.   Teresa Garron R Isla Sabree, LCSW    THERAPIST PROGRESS NOTE  Session Time: 12:30-1:30 pm  Participation Level: Active  Behavioral Response: NAAlertDepressed  Type of Therapy: Individual Therapy  Treatment Goals addressed: Anxiety and Coping  Interventions: Supportive and Other: grief counseling  Summary: Teresa Crawford is a 34 y.o. female who presents with continuing symptoms associated with her uncle's recent death.  Pt reports that she is compliant with prescribed medications, is sleeping well (situational hypersomnia), and has noticed a decrease in appetite. Pt reports that sometimes "I can't think clearly" and is wondering if its grief-related.  Discussed grief with patient and the effects it can have on body and mind: cognitive impact, physical impact, emotional impact, and social impact. Pt reports that she has had multiple losses throughout her lifespan, so her uncle's death has triggered trauma response.  Allowed pt to explore and express thoughts and feelings about her mother's death, and how pt view overall concept of death. Pt reports that it makes her feel much better to talk about it. Pt discussed details from uncle's funeral [employer]) and how sirens are a new trigger for her.  Reviewed relaxation strategies with pt and encouraged her to start with some deep breathing whenever she feels triggered. Encouraged pt to get out of her apartment occasionally for the mental stimulation. Pt reports that she does feel better when she is more active. Pts boyfriend is very good support system and they continue to spend a lot of time together each day.   Suicidal/Homicidal: No  Therapist Response: Teresa Crawford was actively engaged throughout session. Pt reports that she feels more depression/grief symptoms since last session and increase in anxiety symptoms.  The increase in symptoms are indicative of intermittent/fluctuating progress.   Plan: Return again in 2 weeks. Ongoing treatment plan to include trauma processing, grief processing, assisting pt with identifying healthy coping mechanisms and maintaining gains/progress.  Diagnosis: Axis I: Bereavement and Post Traumatic Stress Disorder    Axis II: No diagnosis    Teresa Bo Jamonte Curfman, LCSW 11/28/2019

## 2019-12-15 ENCOUNTER — Ambulatory Visit (INDEPENDENT_AMBULATORY_CARE_PROVIDER_SITE_OTHER): Payer: Medicare Other | Admitting: Licensed Clinical Social Worker

## 2019-12-15 ENCOUNTER — Telehealth: Payer: Self-pay

## 2019-12-15 ENCOUNTER — Other Ambulatory Visit: Payer: Self-pay

## 2019-12-15 DIAGNOSIS — F4310 Post-traumatic stress disorder, unspecified: Secondary | ICD-10-CM

## 2019-12-15 NOTE — Telephone Encounter (Signed)
CONFIRMED PT APPT 12/19/19. BR °

## 2019-12-15 NOTE — Progress Notes (Signed)
Virtual Visit via Video Note  I connected with [name] on 12/15/19 at 11:00 AM EDT by a video enabled telemedicine application and verified that I am speaking with the correct person using two identifiers.  Location: Patient: home  Provider: remote office [place], [name]   I discussed the limitations of evaluation and management by telemedicine and the availability of in person appointments. The patient expressed understanding and agreed to proceed.   I discussed the assessment and treatment plan with the patient. The patient was provided an opportunity to ask questions and all were answered. The patient agreed with the plan and demonstrated an understanding of the instructions.   The patient was advised to call back or seek an in-person evaluation if the symptoms worsen or if the condition fails to improve as anticipated.  I provided 60 minutes of non-face-to-face time during this encounter.   Female Teresa Crawford R Teresa Cashion, LCSW    THERAPIST PROGRESS NOTE  Session Time: 11:00am-12:00 pm  Participation Level: Active  Behavioral Response: Neat and Well GroomedAlertDepressed  Type of Therapy: Individual Therapy  Treatment Goals addressed: Coping  Interventions: CBT, Supportive and Other: grief counseling  Summary: Teresa Crawford is a 34 y.o. female who presents with symptoms related to her diagnosis (PTSD).  Pt reports that her mood has been more sad recently, she is having fatigue all the time, decreased appetite, and is sleeping well with medication--hard to get up in the mornings.   Allowed pt to explore and express thoughts and feelings about current relationships, grief processing, stress finding a job.    Discussed pts current relationship w/ fiance and pt mourning the loss of a friend that "ghosted" her recently. Pt feels to blame for the ending of the friendship, but it was unprovoked.  Explored different perspectives and scenarios with patient to get her to see other  possibilities and perspectives.  Discussed pts current grief journey after the loss of uncle--pt continues to feel really sad when she thinks of him. Discussed how uncle made her feel very secure and protected and how that accepting that he is gone makes pt feel vulnerable and insecure. Reviewed some tips about grief and ways of coping in a healthy way.  Pt interviewed at [employer] today and is excited about possibly working there. Pt fearful of being disappointed if she doesn't get the job--reminded pt that she does have the job coach that she is working with and job coach discusses things with Company secretary so there will be other opportunities if needed.   Pt expressed concerns about recent memory loss: pt often will forget what she is talking about mid-sentence, dates, will forget names of friends and even forget who some of her friends are. This is concerning to pt. Encouraged pt to speak to psychiatrist about her concerns. Pt has appt in 3 days.  Suicidal/Homicidal: No  SI, HI, or AVH reported at time of session.  Therapist Response: Folasade continues to make gains in her ability to manage her overall grief and anxiety. Pt is reporting more fatigue and feelings of overall "sadness" which is indicative of fluctuating/intermittent progress.  Plan: Return again in 4 weeks. Ongoing treatment plan to include trauma processing, grief processing, improving coping mechanisms and maintaining gains/progress.   Diagnosis: Axis I: Post Traumatic Stress Disorder    Axis II: No diagnosis    Teresa Bo Curby Carswell, LCSW 12/15/2019

## 2019-12-18 ENCOUNTER — Other Ambulatory Visit: Payer: Self-pay

## 2019-12-18 ENCOUNTER — Telehealth (INDEPENDENT_AMBULATORY_CARE_PROVIDER_SITE_OTHER): Payer: Medicare Other | Admitting: Psychiatry

## 2019-12-18 ENCOUNTER — Encounter: Payer: Self-pay | Admitting: Psychiatry

## 2019-12-18 DIAGNOSIS — F5105 Insomnia due to other mental disorder: Secondary | ICD-10-CM | POA: Diagnosis not present

## 2019-12-18 DIAGNOSIS — F4310 Post-traumatic stress disorder, unspecified: Secondary | ICD-10-CM

## 2019-12-18 DIAGNOSIS — Z634 Disappearance and death of family member: Secondary | ICD-10-CM | POA: Diagnosis not present

## 2019-12-18 DIAGNOSIS — F331 Major depressive disorder, recurrent, moderate: Secondary | ICD-10-CM

## 2019-12-18 MED ORDER — TRAZODONE HCL 50 MG PO TABS: 50.0000 mg | ORAL_TABLET | Freq: Every day | ORAL | 1 refills | Status: DC

## 2019-12-18 MED ORDER — BUPROPION HCL 75 MG PO TABS: 75.0000 mg | ORAL_TABLET | Freq: Every day | ORAL | 1 refills | Status: DC

## 2019-12-18 NOTE — Patient Instructions (Signed)
Bupropion tablets (Depression/Mood Disorders) What is this medicine? BUPROPION (byoo PROE pee on) is used to treat depression. This medicine may be used for other purposes; ask your health care provider or pharmacist if you have questions. COMMON BRAND NAME(S): Wellbutrin What should I tell my health care provider before I take this medicine? They need to know if you have any of these conditions:  an eating disorder, such as anorexia or bulimia  bipolar disorder or psychosis  diabetes or high blood sugar, treated with medication  glaucoma  heart disease, previous heart attack, or irregular heart beat  head injury or brain tumor  high blood pressure  kidney or liver disease  seizures  suicidal thoughts or a previous suicide attempt  Tourette's syndrome  weight loss  an unusual or allergic reaction to bupropion, other medicines, foods, dyes, or preservatives  breast-feeding  pregnant or trying to become pregnant How should I use this medicine? Take this medicine by mouth with a glass of water. Follow the directions on the prescription label. You can take it with or without food. If it upsets your stomach, take it with food. Take your medicine at regular intervals. Do not take your medicine more often than directed. Do not stop taking this medicine suddenly except upon the advice of your doctor. Stopping this medicine too quickly may cause serious side effects or your condition may worsen. A special MedGuide will be given to you by the pharmacist with each prescription and refill. Be sure to read this information carefully each time. Talk to your pediatrician regarding the use of this medicine in children. Special care may be needed. Overdosage: If you think you have taken too much of this medicine contact a poison control center or emergency room at once. NOTE: This medicine is only for you. Do not share this medicine with others. What if I miss a dose? If you miss a dose,  take it as soon as you can. If it is less than four hours to your next dose, take only that dose and skip the missed dose. Do not take double or extra doses. What may interact with this medicine? Do not take this medicine with any of the following medications:  linezolid  MAOIs like Azilect, Carbex, Eldepryl, Marplan, Nardil, and Parnate  methylene blue (injected into a vein)  other medicines that contain bupropion like Zyban This medicine may also interact with the following medications:  alcohol  certain medicines for anxiety or sleep  certain medicines for blood pressure like metoprolol, propranolol  certain medicines for depression or psychotic disturbances  certain medicines for HIV or AIDS like efavirenz, lopinavir, nelfinavir, ritonavir  certain medicines for irregular heart beat like propafenone, flecainide  certain medicines for Parkinson's disease like amantadine, levodopa  certain medicines for seizures like carbamazepine, phenytoin, phenobarbital  cimetidine  clopidogrel  cyclophosphamide  digoxin  furazolidone  isoniazid  nicotine  orphenadrine  procarbazine  steroid medicines like prednisone or cortisone  stimulant medicines for attention disorders, weight loss, or to stay awake  tamoxifen  theophylline  thiotepa  ticlopidine  tramadol  warfarin This list may not describe all possible interactions. Give your health care provider a list of all the medicines, herbs, non-prescription drugs, or dietary supplements you use. Also tell them if you smoke, drink alcohol, or use illegal drugs. Some items may interact with your medicine. What should I watch for while using this medicine? Tell your doctor if your symptoms do not get better or if they get worse.   Visit your doctor or healthcare provider for regular checks on your progress. Because it may take several weeks to see the full effects of this medicine, it is important to continue your  treatment as prescribed by your doctor. This medicine may cause serious skin reactions. They can happen weeks to months after starting the medicine. Contact your healthcare provider right away if you notice fevers or flu-like symptoms with a rash. The rash may be red or purple and then turn into blisters or peeling of the skin. Or, you might notice a red rash with swelling of the face, lips or lymph nodes in your neck or under your arms. Patients and their families should watch out for new or worsening thoughts of suicide or depression. Also watch out for sudden changes in feelings such as feeling anxious, agitated, panicky, irritable, hostile, aggressive, impulsive, severely restless, overly excited and hyperactive, or not being able to sleep. If this happens, especially at the beginning of treatment or after a change in dose, call your healthcare provider. Avoid alcoholic drinks while taking this medicine. Drinking excessive alcoholic beverages, using sleeping or anxiety medicines, or quickly stopping the use of these agents while taking this medicine may increase your risk for a seizure. Do not drive or use heavy machinery until you know how this medicine affects you. This medicine can impair your ability to perform these tasks. Do not take this medicine close to bedtime. It may prevent you from sleeping. Your mouth may get dry. Chewing sugarless gum or sucking hard candy, and drinking plenty of water may help. Contact your doctor if the problem does not go away or is severe. What side effects may I notice from receiving this medicine? Side effects that you should report to your doctor or health care professional as soon as possible:  allergic reactions like skin rash, itching or hives, swelling of the face, lips, or tongue  breathing problems  changes in vision  confusion  elevated mood, decreased need for sleep, racing thoughts, impulsive behavior  fast or irregular  heartbeat  hallucinations, loss of contact with reality  increased blood pressure  rash, fever, and swollen lymph nodes  redness, blistering, peeling, or loosening of the skin, including inside the mouth  seizures  suicidal thoughts or other mood changes  unusually weak or tired  vomiting Side effects that usually do not require medical attention (report to your doctor or health care professional if they continue or are bothersome):  constipation  headache  loss of appetite  nausea  tremors  weight loss This list may not describe all possible side effects. Call your doctor for medical advice about side effects. You may report side effects to FDA at 1-800-FDA-1088. Where should I keep my medicine? Keep out of the reach of children. Store at room temperature between 20 and 25 degrees C (68 and 77 degrees F), away from direct sunlight and moisture. Keep tightly closed. Throw away any unused medicine after the expiration date. NOTE: This sheet is a summary. It may not cover all possible information. If you have questions about this medicine, talk to your doctor, pharmacist, or health care provider.  2020 [employer]vier/Gold Standard (2018-06-16 14:02:47)  

## 2019-12-18 NOTE — Progress Notes (Signed)
Provider Location : [facility] Patient Location : Home  Participants: Patient , Provider  Virtual Visit via Video Note  I connected with Teresa Crawford on 12/18/19 at  4:20 PM EDT by a video enabled telemedicine application and verified that I am speaking with the correct person using two identifiers.   I discussed the limitations of evaluation and management by telemedicine and the availability of in person appointments. The patient expressed understanding and agreed to proceed.    I discussed the assessment and treatment plan with the patient. The patient was provided an opportunity to ask questions and all were answered. The patient agreed with the plan and demonstrated an understanding of the instructions.   The patient was advised to call back or seek an in-person evaluation if the symptoms worsen or if the condition fails to improve as anticipated.   Teresa Clara [place] MD OP Progress Note  12/18/2019 5:30 PM Teresa Crawford  MRN:  [id number]  Chief Complaint:  Chief Complaint    Follow-up     HPI: Teresa Crawford is a 34 year old Caucasian female who is single, lives in [place], has a history of MDD, PTSD, intellectual disability, mild insomnia, bereavement was evaluated by telemedicine today.  She reports she is currently grieving the loss of her uncle who passed away recently from COVID-41 infection.  She reports she has noticed worsening mood symptoms like sadness, lack of motivation, low energy, memory problems as well as sleep issues.  This is getting worse since the past few weeks.  She reports she has been taking trazodone half tablet at bedtime which helps to some extent.  She reports she is currently in therapy which is going well.  She reports she was able to find a job and will be starting at [employer] soon.  She reports hopefully that will help her to keep herself distracted.  Patient denies any suicidality, homicidality or perceptual disturbances.  Patient denies any other  concerns today.  Visit Diagnosis:    ICD-10-CM   1. MDD (major depressive disorder), recurrent episode, moderate (HCC)  F33.1 traZODone (DESYREL) 50 MG tablet    buPROPion (WELLBUTRIN) 75 MG tablet    TSH  2. Posttraumatic stress disorder  F43.10 traZODone (DESYREL) 50 MG tablet  3. Insomnia due to mental condition  F51.05 traZODone (DESYREL) 50 MG tablet   stable  4. Bereavement  Z63.4 traZODone (DESYREL) 50 MG tablet    Past Psychiatric History: I have reviewed past psychiatric history from my progress note on 08/16/2018  Past Medical History: Patient does report a history of seizure-like spells-likely stress-induced. Past Medical History:  Diagnosis Date   B12 deficiency anemia    Chronic back pain    Chronic fatigue syndrome    Constipation    Depression    Elevated liver enzymes    Endometriosis    Fibroids    Hepatic steatosis    Hypothyroid    Last menstrual period (LMP) > 10 days ago 09/12/15   Mental retardation    Migraine    Post traumatic stress disorder    Posttraumatic stress disorder    PUD (peptic ulcer disease)    Scoliosis    Splenomegaly    Uterine fibroid     Past Surgical History:  Procedure Laterality Date   ABDOMINAL HYSTERECTOMY     BACK SURGERY     COLONOSCOPY  04/27/2006   Wohl-colitis, internal hemorrhoids   COLONOSCOPY  02/2013   focal right colitis, left colon biopsy negative, focal active proctitis  without chronicity   ESOPHAGOGASTRODUODENOSCOPY  02/2013   multiple peptic & duodenal ulcers, negative h pylori   INTRAUTERINE DEVICE INSERTION  2012   LAPAROSCOPIC ENDOMETRIOSIS FULGURATION  2015   [name]   OVARIAN CYST REMOVAL      Family Psychiatric History: I have reviewed family psychiatric history from my progress note on 08/16/2018  Family History:  Family History  Problem Relation Age of Onset   Breast cancer Mother 12   COPD Father    Lung cancer Father 10   Dementia Father    Diabetes Sister     Diabetes Brother    Ovarian cancer Paternal Aunt    Colon cancer Maternal Grandfather 47   Liver disease Maternal Aunt        ?etiology   Lymphoma Maternal Grandmother 65   Lung cancer Other 51       maternal great grandfather   Stomach cancer Other 14       maternal great grandmother    Social History: I have reviewed social history from my progress note on 08/16/2018 Social History   Socioeconomic History   Marital status: Single    Spouse name: Not on file   Number of children: 0   Years of education: Not on file   Highest education level: Not on file  Occupational History   Not on file  Tobacco Use   Smoking status: Never Smoker   Smokeless tobacco: Never Used  Vaping Use   Vaping Use: Never used  Substance and Sexual Activity   Alcohol use: Not Currently    Alcohol/week: 0.0 standard drinks   Drug use: No   Sexual activity: Not Currently  Other Topics Concern   Not on file  Social History Narrative   She has brother, half brother & sister live nearby   Both parents deceased   Care managed by [name]   Social Determinants of Health   Financial Resource Strain:    Difficulty of Paying Living Expenses: Not on file  Food Insecurity:    Worried About Charity fundraiser in the Last Year: Not on file   [employer] of Food in the Last Year: Not on file  Transportation Needs:    Lack of Transportation (Medical): Not on file   Lack of Transportation (Non-Medical): Not on file  Physical Activity:    Days of Exercise per Week: Not on file   Minutes of Exercise per Session: Not on file  Stress:    Feeling of Stress : Not on file  Social Connections:    Frequency of Communication with Friends and Family: Not on file   Frequency of Social Gatherings with Friends and Family: Not on file   Attends Religious Services: Not on file   Active Member of Clubs or Organizations: Not on file   Attends Archivist Meetings: Not on file    Marital Status: Not on file    Allergies:  Allergies  Allergen Reactions   Other Anaphylaxis    Peanut butter, uses Epi pen   Peanut Butter Flavor Swelling    Throat swells Throat swells   Sertraline Other (See Comments)    Panic attacks   Codeine Nausea And Vomiting    nausea   Paroxetine Other (See Comments)    Panic Attack    Paroxetine Hcl Other (See Comments)    Panic attack Other reaction(s): Other (See Comments) Panic attacks   Venlafaxine Other (See Comments)    Panic Attack  Zoloft [Sertraline Hcl] Other (See Comments)    Panic attack    Metabolic Disorder Labs: No results found for: HGBA1C, MPG No results found for: PROLACTIN Lab Results  Component Value Date   CHOL 157 12/09/2018   TRIG 102 12/09/2018   HDL 38 (L) 12/09/2018   LDLCALC 100 (H) 12/09/2018   Lab Results  Component Value Date   TSH 2.740 12/09/2018   TSH 4.710 (H) 12/28/2016    Therapeutic Level Labs: No results found for: LITHIUM No results found for: VALPROATE No components found for:  CBMZ  Current Medications: Current Outpatient Medications  Medication Sig Dispense Refill   acetaminophen (TYLENOL) 500 MG tablet 2 TABS PO BID PRN FOR HEADACHES AND PAINS 45 tablet 3   albuterol (PROAIR HFA) 108 (90 Base) MCG/ACT inhaler Inhale 2 puffs into the lungs every 6 (six) hours as needed for wheezing or shortness of breath. 1 Inhaler 3   benzonatate (TESSALON PERLES) 100 MG capsule Take 1 capsule (100 mg total) by mouth 3 (three) times daily as needed for cough. 20 capsule 0   buPROPion (WELLBUTRIN) 75 MG tablet Take 1 tablet (75 mg total) by mouth daily with breakfast. 30 tablet 1   cetirizine (ZYRTEC) 10 MG tablet TAKE 1 TABLET BY MOUTH AT BEDTIME FOR ALLERGIES 30 tablet 3   chlorzoxazone (PARAFON FORTE DSC) 500 MG tablet Take one tab po bid for back spasm 30 tablet 0   diazepam (VALIUM) 5 MG tablet INSERT 1 TABLET VAGINALLY ONCE DAILY AT BEDTIME 30 tablet 3    docusate sodium (DOK) 100 MG capsule TAKE 1 CAPSULE BY MOUTH TWICE DAILY FOR STOOL SOFTENER 60 capsule 3   EPINEPHrine 0.3 mg/0.3 mL IJ SOAJ injection USE FOR ANAPHYLAXIS 1 Device 5   FLOVENT HFA 220 MCG/ACT inhaler INHALE 2 PUFFS INTO THE LUNGS, 2 TIMES PER DAY. RINSE MOUTH AFTER INHALATIONS 12 g 11   fluticasone (FLONASE) 50 MCG/ACT nasal spray USE 1 SPRAY INTO EACH NOSTRIL ONCE DAILYFOR CONGESTION OR ALLERGIES 48 g 3   guaiFENesin (ROBITUSSIN) 100 MG/5ML SOLN Take 5 mLs (100 mg total) by mouth every 4 (four) hours as needed for cough or to loosen phlegm. 180 mL 0   hydrOXYzine (ATARAX/VISTARIL) 10 MG tablet Take 1 tablet (10 mg total) by mouth 2 (two) times daily as needed for anxiety. 60 tablet 1   ibuprofen (ADVIL) 600 MG tablet ONE TAB PO BID PRN FOR PAIN AND HEADACHES 30 tablet 3   omeprazole (PRILOSEC) 40 MG capsule TAKE 1 CAPSULE BY MOUTH TWICE DAILY 60 capsule 6   topiramate (TOPAMAX) 50 MG tablet TAKE 1 TABLET BY MOUTH EVERY EVENING 30 tablet 3   traMADol (ULTRAM) 50 MG tablet ONE TAB PO BID PRN FOR PAIN AND HEADCAHES 20 tablet 2   traZODone (DESYREL) 50 MG tablet Take 1 tablet (50 mg total) by mouth at bedtime. For sleep 30 tablet 1   No current facility-administered medications for this visit.     Musculoskeletal: Strength & Muscle Tone: UTA Gait & Station: normal Patient leans: N/A  Psychiatric Specialty Exam: Review of Systems  Constitutional: Positive for fatigue.  Psychiatric/Behavioral: Positive for decreased concentration, dysphoric mood and sleep disturbance.  All other systems reviewed and are negative.   Last menstrual period 01/05/2016.There is no height or weight on file to calculate BMI.  General Appearance: Casual  Eye Contact:  Fair  Speech:  Clear and Coherent  Volume:  Normal  Mood:  Dysphoric  Affect:  Congruent  Thought Process:  Goal Directed and Descriptions of Associations: Intact  Orientation:  Full (Time, Place, and Person)  Thought  Content: Logical   Suicidal Thoughts:  No  Homicidal Thoughts:  No  Memory:  Immediate;   Fair Recent;   Fair Remote;   Fair  Judgement:  Fair  Insight:  Fair  Psychomotor Activity:  Normal  Concentration:  Concentration: Fair and Attention Span: Fair  Recall:  [employer] of Knowledge: Fair  Language: Fair  Akathisia:  No  Handed:  Right  AIMS (if indicated): UTA  Assets:  Communication Skills Desire for Improvement Housing  ADL's:  Intact  Cognition: WNL  Sleep:  Poor   Screenings: Mini-Mental     Office Visit from 10/13/2017 in [facility] [place] [place] [facility]  Total Score (max 30 points ) 27    PHQ2-9     Video Visit from 12/18/2019 in [facility] from 11/07/2019 in [facility] Visit from 06[facility]13/2019 in [facility] Office Visit from 02/14/2019 in [facility] [place] from 12/07/2018 in [facility]  PHQ-2 Total Score 6 4 0 0 0  PHQ-9 Total Score 16 9 -- -- --       Assessment and Plan: AYANAH SNADER is a 34 year old Caucasian female, single, on SSD, has a history of MDD, PTSD, insomnia was evaluated by telemedicine today.  Patient is currently struggling with depressive symptoms and has psychosocial stressors of recent death in her family.  Patient will benefit from medication readjustment and psychotherapy sessions.  Plan as noted below.  Plan MDD -unstable PHQ 9 equals 16 Start Wellbutrin 75 mg p.o. daily Increase trazodone to 50 mg p.o. nightly Continue CBT with Ms.Hussami  PTSD-stable Continue CBT  Insomnia-unstable Increase trazodone to 50 mg p.o. nightly  Bereavement-unstable Continue CBT  Follow-up in clinic in 3 to 4 weeks or sooner if needed.  I have spent atleast 20 minutes  face to face with patient today. More than 50 % of the time was spent for preparing to see the patient ( e.g., review of test, records ),ordering  medications and test ,psychoeducation and supportive psychotherapy and care coordination,as well as documenting clinical information in electronic health record. This note was generated in part or whole with voice recognition software. Voice recognition is usually quite accurate but there are transcription errors that can and very often do occur. I apologize for any typographical errors that were not detected and corrected.     Ursula Alert, MD 12/18/2019, 5:30 PM

## 2019-12-19 ENCOUNTER — Ambulatory Visit (INDEPENDENT_AMBULATORY_CARE_PROVIDER_SITE_OTHER): Payer: Medicare Other | Admitting: Nurse Practitioner

## 2019-12-19 ENCOUNTER — Telehealth (HOSPITAL_COMMUNITY): Payer: Self-pay | Admitting: *Deleted

## 2019-12-19 VITALS — BP 114/87 | HR 93 | Temp 97.5°F | Resp 16 | Ht 64.0 in | Wt 211.8 lb

## 2019-12-19 DIAGNOSIS — R300 Dysuria: Secondary | ICD-10-CM | POA: Diagnosis not present

## 2019-12-19 DIAGNOSIS — F3341 Major depressive disorder, recurrent, in partial remission: Secondary | ICD-10-CM | POA: Diagnosis not present

## 2019-12-19 DIAGNOSIS — E041 Nontoxic single thyroid nodule: Secondary | ICD-10-CM

## 2019-12-19 DIAGNOSIS — G43009 Migraine without aura, not intractable, without status migrainosus: Secondary | ICD-10-CM

## 2019-12-19 DIAGNOSIS — Z0001 Encounter for general adult medical examination with abnormal findings: Secondary | ICD-10-CM | POA: Diagnosis not present

## 2019-12-19 DIAGNOSIS — Z23 Encounter for immunization: Secondary | ICD-10-CM | POA: Diagnosis not present

## 2019-12-19 MED ORDER — TRAMADOL HCL 50 MG PO TABS: ORAL_TABLET | ORAL | 3 refills | Status: DC

## 2019-12-19 NOTE — Telephone Encounter (Signed)
I have discontinued her Wellbutrin.

## 2019-12-19 NOTE — Progress Notes (Signed)
[facility] [place], [place] [address]  Internal MEDICINE  Office Visit Note  Patient Name: Teresa Crawford  [id number]  [id number]  Date of Service: 01/10/2020   Pt is here for routine health maintenance examination   Chief Complaint  Patient presents with  . Medicare Wellness    need DC order for tessalon and robitussin  . Depression     The patient is here for health maintenance exam. States that her headaches are managed well overall. Does take topamax daily to prevent migraine headaches. She is unable to tolerate Triptan type analgesics. She will, occaisionally take tramadol when headache is severe. This is rare. She is due to have routine, fasting labs. She denies other concerns or complaints today. She would like flu shot while she is In the office today.    Current Medication: Outpatient Encounter Medications as of 12/19/2019  Medication Sig  . acetaminophen (TYLENOL) 500 MG tablet 2 TABS PO BID PRN FOR HEADACHES AND PAINS  . albuterol (PROAIR HFA) 108 (90 Base) MCG/ACT inhaler Inhale 2 puffs into the lungs every 6 (six) hours as needed for wheezing or shortness of breath.  . chlorzoxazone (PARAFON FORTE DSC) 500 MG tablet Take one tab po bid for back spasm  . diazepam (VALIUM) 5 MG tablet INSERT 1 TABLET VAGINALLY ONCE DAILY AT BEDTIME  . docusate sodium (DOK) 100 MG capsule TAKE 1 CAPSULE BY MOUTH TWICE DAILY FOR STOOL SOFTENER  . EPINEPHrine 0.3 mg/0.3 mL IJ SOAJ injection USE FOR ANAPHYLAXIS  . FLOVENT HFA 220 MCG/ACT inhaler INHALE 2 PUFFS INTO THE LUNGS, 2 TIMES PER DAY. RINSE MOUTH AFTER INHALATIONS  . fluticasone (FLONASE) 50 MCG/ACT nasal spray USE 1 SPRAY INTO EACH NOSTRIL ONCE DAILYFOR CONGESTION OR ALLERGIES  . guaiFENesin (ROBITUSSIN) 100 MG/5ML SOLN Take 5 mLs (100 mg total) by mouth every 4 (four) hours as needed for cough or to loosen phlegm.  . hydrOXYzine (ATARAX/VISTARIL) 10 MG tablet Take 1 tablet (10 mg total) by mouth 2  (two) times daily as needed for anxiety.  Marland Kitchen ibuprofen (ADVIL) 600 MG tablet ONE TAB PO BID PRN FOR PAIN AND HEADACHES  . omeprazole (PRILOSEC) 40 MG capsule TAKE 1 CAPSULE BY MOUTH TWICE DAILY  . traMADol (ULTRAM) 50 MG tablet ONE TAB PO BID PRN FOR PAIN AND HEADCAHES  . [DISCONTINUED] benzonatate (TESSALON PERLES) 100 MG capsule Take 1 capsule (100 mg total) by mouth 3 (three) times daily as needed for cough.  . [DISCONTINUED] buPROPion (WELLBUTRIN) 75 MG tablet Take 1 tablet (75 mg total) by mouth daily with breakfast.  . [DISCONTINUED] cetirizine (ZYRTEC) 10 MG tablet TAKE 1 TABLET BY MOUTH AT BEDTIME FOR ALLERGIES  . [DISCONTINUED] topiramate (TOPAMAX) 50 MG tablet TAKE 1 TABLET BY MOUTH EVERY EVENING  . [DISCONTINUED] traMADol (ULTRAM) 50 MG tablet ONE TAB PO BID PRN FOR PAIN AND HEADCAHES  . [DISCONTINUED] traZODone (DESYREL) 50 MG tablet Take 1 tablet (50 mg total) by mouth at bedtime. For sleep   No facility-administered encounter medications on file as of 12/19/2019.    Surgical History: Past Surgical History:  Procedure Laterality Date  . ABDOMINAL HYSTERECTOMY    . BACK SURGERY    . COLONOSCOPY  04/27/2006   Wohl-colitis, internal hemorrhoids  . COLONOSCOPY  02/2013   focal right colitis, left colon biopsy negative, focal active proctitis without chronicity  . ESOPHAGOGASTRODUODENOSCOPY  02/2013   multiple peptic & duodenal ulcers, negative h pylori  . INTRAUTERINE DEVICE INSERTION  2012  .  LAPAROSCOPIC ENDOMETRIOSIS FULGURATION  2015   [name]  . OVARIAN CYST REMOVAL      Medical History: Past Medical History:  Diagnosis Date  . B12 deficiency anemia   . Chronic back pain   . Chronic fatigue syndrome   . Constipation   . Depression   . Elevated liver enzymes   . Endometriosis   . Fibroids   . Hepatic steatosis   . Hypothyroid   . Last menstrual period (LMP) > 10 days ago 09/12/15  . Mental retardation   . Migraine   . Post traumatic stress disorder   .  Posttraumatic stress disorder   . PUD (peptic ulcer disease)   . Scoliosis   . Splenomegaly   . Uterine fibroid     Family History: Family History  Problem Relation Age of Onset  . Breast cancer Mother 36  . COPD Father   . Lung cancer Father 64  . Dementia Father   . Diabetes Sister   . Diabetes Brother   . Ovarian cancer Paternal Aunt   . Colon cancer Maternal Grandfather 18  . Liver disease Maternal Aunt        ?etiology  . Lymphoma Maternal Grandmother 60  . Lung cancer Other 1       maternal great grandfather  . Stomach cancer Other 62       maternal great grandmother      Review of Systems  Constitutional: Negative for activity change, chills, fatigue and unexpected weight change.  HENT: Negative for congestion, postnasal drip, rhinorrhea, sneezing and sore throat.   Respiratory: Negative for cough, chest tightness, shortness of breath and wheezing.   Cardiovascular: Negative for chest pain and palpitations.  Gastrointestinal: Negative for abdominal pain, constipation, diarrhea, nausea and vomiting.  Endocrine: Negative for cold intolerance, heat intolerance, polydipsia and polyuria.  Genitourinary: Negative for dysuria, frequency and urgency.  Musculoskeletal: Negative for arthralgias, back pain, joint swelling and neck pain.  Skin: Negative for rash.  Allergic/Immunologic: Negative for environmental allergies.  Neurological: Positive for headaches. Negative for dizziness, tremors and numbness.       History of seizure disorder and migraine headaches  Hematological: Negative for adenopathy. Does not bruise/bleed easily.  Psychiatric/Behavioral: Negative for behavioral problems (Depression), sleep disturbance and suicidal ideas. The patient is nervous/anxious.      Today's Vitals   12/19/19 1005  BP: 114/87  Pulse: 93  Resp: 16  Temp: (!) 97.5 F (36.4 C)  SpO2: 97%  Weight: 211 lb 12.8 oz (96.1 kg)  Height: 5' 4"  (1.626 m)   Body mass index is 36.36  kg/m.  Physical Exam Vitals and nursing note reviewed.  Constitutional:      General: She is not in acute distress.    Appearance: Normal appearance. She is well-developed. She is obese. She is not diaphoretic.  HENT:     Head: Normocephalic and atraumatic.     Nose: Nose normal.     Mouth/Throat:     Pharynx: No oropharyngeal exudate.  Eyes:     Pupils: Pupils are equal, round, and reactive to light.  Neck:     Thyroid: No thyromegaly.     Vascular: No JVD.     Trachea: No tracheal deviation.  Cardiovascular:     Rate and Rhythm: Normal rate and regular rhythm.     Pulses: Normal pulses.     Heart sounds: Normal heart sounds. No murmur heard.  No friction rub. No gallop.   Pulmonary:  Effort: Pulmonary effort is normal. No respiratory distress.     Breath sounds: Normal breath sounds. No wheezing or rales.  Chest:     Chest wall: No tenderness.     Breasts:        Right: Normal. No swelling, bleeding, inverted nipple, mass, nipple discharge, skin change or tenderness.        Left: Normal. No swelling, bleeding, inverted nipple, mass, nipple discharge, skin change or tenderness.  Abdominal:     General: Bowel sounds are normal.     Palpations: Abdomen is soft.     Tenderness: There is no abdominal tenderness.  Musculoskeletal:        General: Normal range of motion.     Cervical back: Normal range of motion and neck supple.  Lymphadenopathy:     Cervical: No cervical adenopathy.     Upper Body:     Right upper body: No axillary adenopathy.     Left upper body: No axillary adenopathy.  Skin:    General: Skin is warm and dry.  Neurological:     Mental Status: She is alert and oriented to person, place, and time. Mental status is at baseline.     Cranial Nerves: No cranial nerve deficit.  Psychiatric:        Attention and Perception: Attention and perception normal.        Mood and Affect: Mood is anxious.        Speech: Speech normal.        Behavior: Behavior  normal. Behavior is cooperative.        Thought Content: Thought content normal.        Cognition and Memory: Cognition and memory normal.        Judgment: Judgment normal.    Depression screen [facility] 2/9 12/19/2019 06/13/2019 02/14/2019 12/07/2018 08/30/2018  Decreased Interest 0 0 0 0 0  Down, Depressed, Hopeless 0 0 0 0 0  PHQ - 2 Score 0 0 0 0 0  Some encounter information is confidential and restricted. Go to Review Flowsheets activity to see all data.    Functional Status Survey: Is the patient deaf or have difficulty hearing?: No Does the patient have difficulty seeing, even when wearing glasses/contacts?: No Does the patient have difficulty concentrating, remembering, or making decisions?: No Does the patient have difficulty walking or climbing stairs?: No Does the patient have difficulty dressing or bathing?: No Does the patient have difficulty doing errands alone such as visiting a doctor's office or shopping?: Yes  MMSE - [place] Exam 12/19/2019 10/13/2017  Orientation to time 5 5  Orientation to Place 5 5  Registration 3 3  Attention/ Calculation 5 5  Recall 3 3  Language- name 2 objects 2 0  Language- repeat 1 1  Language- follow 3 step command 3 3  Language- read & follow direction 1 1  Write a sentence 1 0  Copy design 1 1  Total score 30 27    Fall Risk  12/19/2019 06/13/2019 02/14/2019 12/07/2018 08/30/2018  Falls in the past year? 0 0 0 0 0  Comment - - - - -  Number falls in past yr: - - 0 - -  Injury with Fall? - - 0 - -     LABS: Recent Results (from the past 2160 hour(s))  UA/M w/rflx Culture, Routine     Status: None   Collection Time: 12/19/19 11:46 AM   Specimen: Urine   Urine  Result Value Ref  Range   Specific Gravity, UA CANCELED     Comment: Test not performed. Insufficient specimen to perform or complete analysis.  Result canceled by the ancillary.    pH, UA CANCELED     Comment: Test not performed  Result canceled by the ancillary.     Protein,UA CANCELED     Comment: Test not performed  Result canceled by the ancillary.    Glucose, UA CANCELED     Comment: Test not performed  Result canceled by the ancillary.    Ketones, UA CANCELED     Comment: Test not performed  Result canceled by the ancillary.   Comprehensive metabolic panel     Status: Abnormal   Collection Time: 12/20/19  9:37 AM  Result Value Ref Range   Glucose 118 (H) 65 - 99 mg/dL   BUN 8 6 - 20 mg/dL   Creatinine, Ser 0.74 0.57 - 1.00 mg/dL   GFR calc non Af Amer 105 >59 mL/min/1.73   GFR calc Af Amer 121 >59 mL/min/1.73    Comment: **[facility] currently reports eGFR in compliance with the current**   recommendations of the [employer]. [facility] will   update reporting as new guidelines are published from the NKF-ASN   Task force.    BUN/Creatinine Ratio 11 9 - 23   Sodium 142 134 - 144 mmol/L   Potassium 4.3 3.5 - 5.2 mmol/L   Chloride 104 96 - 106 mmol/L   CO2 24 20 - 29 mmol/L   Calcium 9.0 8.7 - 10.2 mg/dL   Total Protein 6.9 6.0 - 8.5 g/dL   Albumin 4.1 3.8 - 4.8 g/dL   Globulin, Total 2.8 1.5 - 4.5 g/dL   Albumin/Globulin Ratio 1.5 1.2 - 2.2   Bilirubin Total 0.5 0.0 - 1.2 mg/dL   Alkaline Phosphatase 119 44 - 121 IU/L    Comment:               **Please note reference interval change**   AST 13 0 - 40 IU/L   ALT 16 0 - 32 IU/L  CBC     Status: None   Collection Time: 12/20/19  9:37 AM  Result Value Ref Range   WBC 6.6 3.4 - 10.8 x10E3/uL   RBC 4.54 3.77 - 5.28 x10E6/uL   Hemoglobin 13.2 11.1 - 15.9 g/dL   Hematocrit 39.4 34.0 - 46.6 %   MCV 87 79 - 97 fL   MCH 29.1 26.6 - 33.0 pg   MCHC 33.5 31 - 35 g/dL   RDW 14.0 11.7 - 15.4 %   Platelets 218 150 - 450 x10E3/uL  Lipid Panel With LDL/HDL Ratio     Status: Abnormal   Collection Time: 12/20/19  9:37 AM  Result Value Ref Range   Cholesterol, Total 169 100 - 199 mg/dL   Triglycerides 117 0 - 149 mg/dL   HDL 34 (L) >39 mg/dL   VLDL Cholesterol Cal 21 5 - 40  mg/dL   LDL Chol Calc (NIH) 114 (H) 0 - 99 mg/dL   LDL/HDL Ratio 3.4 (H) 0.0 - 3.2 ratio    Comment:                                     LDL/HDL Ratio  Men  Women                               1/2 Avg.Risk  1.0    1.5                                   Avg.Risk  3.6    3.2                                2X Avg.Risk  6.2    5.0                                3X Avg.Risk  8.0    6.1   Hgb A1c w/o eAG     Status: None   Collection Time: 12/20/19  9:37 AM  Result Value Ref Range   Hgb A1c MFr Bld 5.5 4.8 - 5.6 %    Comment:          Prediabetes: 5.7 - 6.4          Diabetes: >6.4          Glycemic control for adults with diabetes: <7.0   T4, free     Status: None   Collection Time: 12/20/19  9:37 AM  Result Value Ref Range   Free T4 1.07 0.82 - 1.77 ng/dL  TSH     Status: None   Collection Time: 12/20/19  9:37 AM  Result Value Ref Range   TSH 3.820 0.450 - 4.500 uIU/mL  VITAMIN D 25 Hydroxy (Vit-D Deficiency, Fractures)     Status: Abnormal   Collection Time: 12/20/19  9:37 AM  Result Value Ref Range   Vit D, 25-Hydroxy 11.4 (L) 30.0 - 100.0 ng/mL    Comment: Vitamin D deficiency has been defined by the [place] and an Endocrine Society practice guideline as a level of serum 25-OH vitamin D less than 20 ng/mL (1,2). The Endocrine Society went on to further define vitamin D insufficiency as a level between 21 and 29 ng/mL (2). 1. IOM (Institute of Medicine). 2010. Dietary reference    intakes for calcium and D. [place]: The    [employer]. 2. Holick MF, Binkley Rothschild, Bischoff-Ferrari HA, et al.    Evaluation, treatment, and prevention of vitamin D    deficiency: an Endocrine Society clinical practice    guideline. JCEM. 2011 Jul; 96(7):1911-30.     Assessment/Plan:  1. Encounter for general adult medical examination with abnormal findings Annual health maintenance exam today. Lab slip given to have  routine, fasting labs drawn.   2. Migraine without aura, not intractable, without status migrainosus Continue topamax daily. May take tramadol twice daily as needed for severe headache. New rx for #20 tablets was sent to her pharmacy today.  - traMADol (ULTRAM) 50 MG tablet; ONE TAB PO BID PRN FOR PAIN AND HEADCAHES  Dispense: 20 tablet; Refill: 3  3. Nontoxic single thyroid nodule Single thyroid nodule on upper pole of left thyroid lobe. Will monitor closely.   4. MDD (major depressive disorder), recurrent, in partial remission ([place]) Continue reglar visits with psychiatry.   5. Needs flu shot Flu vaccine administered in the office  - Flu Vaccine MDCK QUAD PF  6. Dysuria -  UA/M w/rflx Culture, Routine  General Counseling: delania ferg understanding of the findings of todays visit and agrees with plan of treatment. I have discussed any further diagnostic evaluation that may be needed or ordered today. We also reviewed her medications today. she has been encouraged to call the office with any questions or concerns that should arise related to todays visit.    Counseling:  This patient was seen by Leretha Pol FNP Collaboration with Dr Lavera Guise as a part of collaborative care agreement  Orders Placed This Encounter  Procedures  . Flu Vaccine MDCK QUAD PF  . UA/M w/rflx Culture, Routine    Meds ordered this encounter  Medications  . traMADol (ULTRAM) 50 MG tablet    Sig: ONE TAB PO BID PRN FOR PAIN AND HEADCAHES    Dispense:  20 tablet    Refill:  3    Order Specific Question:   Supervising Provider    Answer:   Lavera Guise [[id number]]    Total time spent: 13 Minutes  Time spent includes review of chart, medications, test results, and follow up plan with the patient.     Lavera Guise, MD  Internal Medicine

## 2019-12-19 NOTE — Telephone Encounter (Signed)
Received call from patient this AM stating that she did not want to start Wellbutrin.  I returned call to her. .she said she was starting a job and felt now was not the time to start a new medication ..she would like med DC'd and will talk to you in the future about his. FYI

## 2019-12-20 ENCOUNTER — Other Ambulatory Visit: Payer: Self-pay | Admitting: Nurse Practitioner

## 2019-12-20 ENCOUNTER — Telehealth: Payer: Self-pay

## 2019-12-20 DIAGNOSIS — E559 Vitamin D deficiency, unspecified: Secondary | ICD-10-CM | POA: Diagnosis not present

## 2019-12-20 DIAGNOSIS — E782 Mixed hyperlipidemia: Secondary | ICD-10-CM | POA: Diagnosis not present

## 2019-12-20 DIAGNOSIS — Z0001 Encounter for general adult medical examination with abnormal findings: Secondary | ICD-10-CM | POA: Diagnosis not present

## 2019-12-20 DIAGNOSIS — E041 Nontoxic single thyroid nodule: Secondary | ICD-10-CM | POA: Diagnosis not present

## 2019-12-20 DIAGNOSIS — R7301 Impaired fasting glucose: Secondary | ICD-10-CM | POA: Diagnosis not present

## 2019-12-20 NOTE — Telephone Encounter (Signed)
Returned call to patient.  Will discontinue trazodone.  Wellbutrin was already discontinued yesterday.  Left voicemail.

## 2019-12-20 NOTE — Telephone Encounter (Signed)
pt called left message that she wants to stop the wellbutrin, trazodone

## 2019-12-21 ENCOUNTER — Telehealth: Payer: Self-pay | Admitting: Psychiatry

## 2019-12-21 ENCOUNTER — Telehealth: Payer: Self-pay

## 2019-12-21 LAB — COMPREHENSIVE METABOLIC PANEL
ALT: 16 IU/L (ref 0–32)
AST: 13 IU/L (ref 0–40)
Albumin/Globulin Ratio: 1.5 (ref 1.2–2.2)
Albumin: 4.1 g/dL (ref 3.8–4.8)
Alkaline Phosphatase: 119 IU/L (ref 44–121)
BUN/Creatinine Ratio: 11 (ref 9–23)
BUN: 8 mg/dL (ref 6–20)
Bilirubin Total: 0.5 mg/dL (ref 0.0–1.2)
CO2: 24 mmol/L (ref 20–29)
Calcium: 9 mg/dL (ref 8.7–10.2)
Chloride: 104 mmol/L (ref 96–106)
Creatinine, Ser: 0.74 mg/dL (ref 0.57–1.00)
GFR calc Af Amer: 121 mL/min/{1.73_m2} (ref >59)
GFR calc non Af Amer: 105 mL/min/{1.73_m2} (ref >59)
Globulin, Total: 2.8 g/dL (ref 1.5–4.5)
Glucose: 118 mg/dL — ABNORMAL HIGH (ref 65–99)
Potassium: 4.3 mmol/L (ref 3.5–5.2)
Sodium: 142 mmol/L (ref 134–144)
Total Protein: 6.9 g/dL (ref 6.0–8.5)

## 2019-12-21 LAB — VITAMIN D 25 HYDROXY (VIT D DEFICIENCY, FRACTURES): Vit D, 25-Hydroxy: 11.4 ng/mL — ABNORMAL LOW (ref 30.0–100.0)

## 2019-12-21 LAB — CBC
Hematocrit: 39.4 % (ref 34.0–46.6)
Hemoglobin: 13.2 g/dL (ref 11.1–15.9)
MCH: 29.1 pg (ref 26.6–33.0)
MCHC: 33.5 g/dL (ref 31.5–35.7)
MCV: 87 fL (ref 79–97)
Platelets: 218 10*3/uL (ref 150–450)
RBC: 4.54 x10E6/uL (ref 3.77–5.28)
RDW: 14 % (ref 11.7–15.4)
WBC: 6.6 10*3/uL (ref 3.4–10.8)

## 2019-12-21 LAB — T4, FREE: Free T4: 1.07 ng/dL (ref 0.82–1.77)

## 2019-12-21 LAB — HGB A1C W/O EAG: Hgb A1c MFr Bld: 5.5 % (ref 4.8–5.6)

## 2019-12-21 LAB — TSH: TSH: 3.82 u[IU]/mL (ref 0.450–4.500)

## 2019-12-21 LAB — LIPID PANEL WITH LDL/HDL RATIO
Cholesterol, Total: 169 mg/dL (ref 100–199)
HDL: 34 mg/dL — ABNORMAL LOW (ref >39)
LDL Chol Calc (NIH): 114 mg/dL — ABNORMAL HIGH (ref 0–99)
LDL/HDL Ratio: 3.4 ratio — ABNORMAL HIGH (ref 0.0–3.2)
Triglycerides: 117 mg/dL (ref 0–149)
VLDL Cholesterol Cal: 21 mg/dL (ref 5–40)

## 2019-12-21 NOTE — Telephone Encounter (Signed)
I already Discontinued her medications in the system. Olin Hauser could you please call [facility] and pls ask them to DC Wellbutrin and Trazodone.

## 2019-12-21 NOTE — Telephone Encounter (Signed)
Faxed D/c order to [facility] for robitussin and tessalon

## 2019-12-22 ENCOUNTER — Other Ambulatory Visit: Payer: Self-pay

## 2019-12-22 MED ORDER — CETIRIZINE HCL 10 MG PO TABS: ORAL_TABLET | ORAL | 3 refills | Status: DC

## 2019-12-22 MED ORDER — TOPIRAMATE 50 MG PO TABS: ORAL_TABLET | ORAL | 3 refills | Status: DC

## 2019-12-23 LAB — UA/M W/RFLX CULTURE, ROUTINE

## 2019-12-23 NOTE — Telephone Encounter (Signed)
pt called left a message that she wanted to increase the trazodone and the wellbutrin

## 2019-12-25 NOTE — Telephone Encounter (Signed)
Ok I am confused. This patient has been calling to discontinue her medications the past week. Now she wants to increase her medication ? Please contact patient and put her on my schedule to be evaluated . I do have an opening at 2: 00 pm  thanks

## 2020-01-04 ENCOUNTER — Telehealth: Payer: Self-pay

## 2020-01-04 NOTE — Telephone Encounter (Signed)
Patient fl2 form ready for pick up. Teresa Crawford

## 2020-01-08 ENCOUNTER — Other Ambulatory Visit: Payer: Self-pay

## 2020-01-08 ENCOUNTER — Ambulatory Visit (INDEPENDENT_AMBULATORY_CARE_PROVIDER_SITE_OTHER): Payer: Medicare Other | Admitting: Licensed Clinical Social Worker

## 2020-01-08 DIAGNOSIS — F4310 Post-traumatic stress disorder, unspecified: Secondary | ICD-10-CM | POA: Diagnosis not present

## 2020-01-08 DIAGNOSIS — Z634 Disappearance and death of family member: Secondary | ICD-10-CM | POA: Diagnosis not present

## 2020-01-08 DIAGNOSIS — F331 Major depressive disorder, recurrent, moderate: Secondary | ICD-10-CM | POA: Diagnosis not present

## 2020-01-08 NOTE — Progress Notes (Addendum)
Virtual Visit via Video Note  I connected with [name] on 01/08/20 at  1:30 PM EDT by a video enabled telemedicine application and verified that I am speaking with the correct person using two identifiers.  Location: Patient: home Provider: [facility]   I discussed the limitations of evaluation and management by telemedicine and the availability of in person appointments. The patient expressed understanding and agreed to proceed.   The patient was advised to call back or seek an in-person evaluation if the symptoms worsen or if the condition fails to improve as anticipated.  I provided 60 minutes of non-face-to-face time during this encounter.   Carlous Olivares R Yaileen Hofferber, LCSW    THERAPIST PROGRESS NOTE  Session Time: 1:30-2:30  Participation Level: Active  Behavioral Response: Neat and Well GroomedAlertAnxious  Type of Therapy: Individual Therapy  Treatment Goals addressed: Anxiety and Coping  Interventions: CBT and Supportive  Summary: Teresa Crawford is a 34 y.o. female who presents with improving symptoms related to her diagnoses (depression, PTSD, bereavement).  Pt reports that she is compliant with medication, good quality and quantity of sleep, and that appetite is slightly decreased but pt is happy because she feels she is losing some weight.  Allowed pt to explore and express thoughts and feelings about current life stressors: continuing grief over loss of uncle, stress associated with a friend that "ghosted" her, adjustment to new job.   Pt reports that she is only having occasional break-through grief/crying episodes when thinking about her uncle. Pt feels she is managing her overall grief better.   Discussed pts new job--pt is very happy about the job and feels that it is giving her an opportunity to engage with other people, and an overall purpose. Pt admits that she feels happier than she has felt in a long time "Happy like when I was working at [employer]". Pt has  flexible schedule and feels well supported by supervisors and with job coach.  Discussed setting boundaries with a friend that overshares information about their life--pt has had to work hard to set boundaries with this friend but admits that she feels better after setting and adhering to the boundaries. Discussed how pt fels after the other friend "ghosted" her.   Pt having some medication questions; would like someone to call her to confirm dosages and listen to her concerns.  Continued to encouraged self-care, especially on pts days off work.   Suicidal/Homicidal: No  SI, HI, or AVH reported at time of session.  Therapist Response: [name] continues to make good progress with self understanding and self insight. Pt continues to make good progress in the areas of occupational functioning and achievement. Pt reports that she is feeling more confident in self, which is reflective of continued progress.   Plan: Return again in 4 weeks. The ongoing treatment plan includes maintaining current levels of progress and continuing to build skills to manage mood, improve stress/anxiety management, emotion regulation, distress tolerance, and behavior modification.   Diagnosis: Axis I: Bereavement, Major Depression, Recurrent moderate and Post Traumatic Stress Disorder    Axis II: No diagnosis    [place], LCSW 01/08/2020

## 2020-01-10 ENCOUNTER — Encounter: Payer: Self-pay | Admitting: Psychiatry

## 2020-01-10 ENCOUNTER — Telehealth (INDEPENDENT_AMBULATORY_CARE_PROVIDER_SITE_OTHER): Payer: Medicare Other | Admitting: Psychiatry

## 2020-01-10 ENCOUNTER — Other Ambulatory Visit: Payer: Self-pay

## 2020-01-10 DIAGNOSIS — F331 Major depressive disorder, recurrent, moderate: Secondary | ICD-10-CM | POA: Diagnosis not present

## 2020-01-10 DIAGNOSIS — F5105 Insomnia due to other mental disorder: Secondary | ICD-10-CM | POA: Diagnosis not present

## 2020-01-10 DIAGNOSIS — F4310 Post-traumatic stress disorder, unspecified: Secondary | ICD-10-CM | POA: Diagnosis not present

## 2020-01-10 DIAGNOSIS — Z23 Encounter for immunization: Secondary | ICD-10-CM | POA: Insufficient documentation

## 2020-01-10 DIAGNOSIS — Z0001 Encounter for general adult medical examination with abnormal findings: Secondary | ICD-10-CM | POA: Insufficient documentation

## 2020-01-10 DIAGNOSIS — Z634 Disappearance and death of family member: Secondary | ICD-10-CM | POA: Diagnosis not present

## 2020-01-10 MED ORDER — TRAZODONE HCL 50 MG PO TABS: 25.0000 mg | ORAL_TABLET | Freq: Every day | ORAL | 0 refills | Status: DC

## 2020-01-10 MED ORDER — BUPROPION HCL 75 MG PO TABS: 75.0000 mg | ORAL_TABLET | Freq: Every morning | ORAL | 0 refills | Status: DC

## 2020-01-10 NOTE — Progress Notes (Signed)
Provider Location : [facility] Patient Location : Home  Participants: Patient , Provider  Virtual Visit via Video Note  I connected with Teresa Crawford on 01/10/20 at  3:50 PM EDT by a video enabled telemedicine application and verified that I am speaking with the correct person using two identifiers.   I discussed the limitations of evaluation and management by telemedicine and the availability of in person appointments. The patient expressed understanding and agreed to proceed.    I discussed the assessment and treatment plan with the patient. The patient was provided an opportunity to ask questions and all were answered. The patient agreed with the plan and demonstrated an understanding of the instructions.   The patient was advised to call back or seek an in-person evaluation if the symptoms worsen or if the condition fails to improve as anticipated.    [facility] OP Progress Note  01/10/2020 5:16 PM ARLENY KRUGER  MRN:  [id number]  Chief Complaint:  Chief Complaint    Follow-up     HPI: Teresa Crawford is a 34 year old Caucasian female who is single, lives in [place], has a history of MDD, PTSD, insomnia, bereavement was evaluated by telemedicine today.  Patient was evaluated today since patient was calling the clinic to make medication readjustment back to back.  Patient initially contacted reporting she did not want to take her medication mid-September.  Then 2 days later she called wanting to increase the dosage of her medications.  Patient today reports she wanted to get off of the medications however when she stopped taking the medication she felt she could not do it.  She hence got back on the Wellbutrin and the trazodone.  She has started working at [employer] 3 to 4 days a week.  She reports she works part-time-4 hours and that is going well.  She really enjoys her work.  She however reports when she tried taking the 50 mg of trazodone it made her feel groggy in the morning.   She reports she would like to reduce the dosage of the trazodone.  She wants to stay on the current dosage of Wellbutrin.  She denies any significant depression other than her normal grief reaction to the death of her uncle.  She reports she is coping okay with that.  She also reports she is planning a memory party for her mother who passed away from breast cancer.  Patient denies any suicidality, homicidality or perceptual disturbances.  Patient reports psychotherapy sessions is going well.  Visit Diagnosis:    ICD-10-CM   1. MDD (major depressive disorder), recurrent episode, moderate (HCC)  F33.1 buPROPion (WELLBUTRIN) 75 MG tablet  2. Posttraumatic stress disorder  F43.10   3. Insomnia due to mental condition  F51.05 traZODone (DESYREL) 50 MG tablet  4. Bereavement  Z63.4     Past Psychiatric History: I have reviewed past psychiatric history from my progress note on 08/16/2018  Past Medical History:  Past Medical History:  Diagnosis Date  . B12 deficiency anemia   . Chronic back pain   . Chronic fatigue syndrome   . Constipation   . Depression   . Elevated liver enzymes   . Endometriosis   . Fibroids   . Hepatic steatosis   . Hypothyroid   . Last menstrual period (LMP) > 10 days ago 09/12/15  . Mental retardation   . Migraine   . Post traumatic stress disorder   . Posttraumatic stress disorder   . PUD (peptic ulcer  disease)   . Scoliosis   . Splenomegaly   . Uterine fibroid     Past Surgical History:  Procedure Laterality Date  . ABDOMINAL HYSTERECTOMY    . BACK SURGERY    . COLONOSCOPY  04/27/2006   Wohl-colitis, internal hemorrhoids  . COLONOSCOPY  02/2013   focal right colitis, left colon biopsy negative, focal active proctitis without chronicity  . ESOPHAGOGASTRODUODENOSCOPY  02/2013   multiple peptic & duodenal ulcers, negative h pylori  . INTRAUTERINE DEVICE INSERTION  2012  . LAPAROSCOPIC ENDOMETRIOSIS FULGURATION  2015   [name]  . OVARIAN CYST REMOVAL       Family Psychiatric History: I have reviewed family psychiatric history from my progress note on 08/16/2018  Family History:  Family History  Problem Relation Age of Onset  . Breast cancer Mother 92  . COPD Father   . Lung cancer Father 19  . Dementia Father   . Diabetes Sister   . Diabetes Brother   . Ovarian cancer Paternal Aunt   . Colon cancer Maternal Grandfather 70  . Liver disease Maternal Aunt        ?etiology  . Lymphoma Maternal Grandmother 60  . Lung cancer Other 17       maternal great grandfather  . Stomach cancer Other 8       maternal great grandmother    Social History: I have reviewed social history from my progress note on 08/16/2018 Social History   Socioeconomic History  . Marital status: Single    Spouse name: Not on file  . Number of children: 0  . Years of education: Not on file  . Highest education level: Not on file  Occupational History  . Not on file  Tobacco Use  . Smoking status: Never Smoker  . Smokeless tobacco: Never Used  Vaping Use  . Vaping Use: Never used  Substance and Sexual Activity  . Alcohol use: Not Currently    Alcohol/week: 0.0 standard drinks  . Drug use: No  . Sexual activity: Not Currently  Other Topics Concern  . Not on file  Social History Narrative   She has brother, half brother & sister live nearby   Both parents deceased   Care managed by [name]   Social Determinants of Health   Financial Resource Strain:   . Difficulty of Paying Living Expenses: Not on file  Food Insecurity:   . Worried About Charity fundraiser in the Last Year: Not on file  . Ran Out of Food in the Last Year: Not on file  Transportation Needs:   . Lack of Transportation (Medical): Not on file  . Lack of Transportation (Non-Medical): Not on file  Physical Activity:   . Days of Exercise per Week: Not on file  . Minutes of Exercise per Session: Not on file  Stress:   . Feeling of Stress : Not on file  Social Connections:    . Frequency of Communication with Friends and Family: Not on file  . Frequency of Social Gatherings with Friends and Family: Not on file  . Attends Religious Services: Not on file  . Active Member of Clubs or Organizations: Not on file  . Attends Archivist Meetings: Not on file  . Marital Status: Not on file    Allergies:  Allergies  Allergen Reactions  . Other Anaphylaxis    Peanut butter, uses Epi pen  . Peanut Butter Flavor Swelling    Throat swells Throat swells  .  Sertraline Other (See Comments)    Panic attacks  . Codeine Nausea And Vomiting    nausea  . Paroxetine Other (See Comments)    Panic Attack   . Paroxetine Hcl Other (See Comments)    Panic attack Other reaction(s): Other (See Comments) Panic attacks  . Venlafaxine Other (See Comments)    Panic Attack   . Zoloft [Sertraline Hcl] Other (See Comments)    Panic attack    Metabolic Disorder Labs: Lab Results  Component Value Date   HGBA1C 5.5 12/20/2019   No results found for: PROLACTIN Lab Results  Component Value Date   CHOL 169 12/20/2019   TRIG 117 12/20/2019   HDL 34 (L) 12/20/2019   LDLCALC 114 (H) 12/20/2019   LDLCALC 100 (H) 12/09/2018   Lab Results  Component Value Date   TSH 3.820 12/20/2019   TSH 2.740 12/09/2018    Therapeutic Level Labs: No results found for: LITHIUM No results found for: VALPROATE No components found for:  CBMZ  Current Medications: Current Outpatient Medications  Medication Sig Dispense Refill  . acetaminophen (TYLENOL) 500 MG tablet 2 TABS PO BID PRN FOR HEADACHES AND PAINS 45 tablet 3  . albuterol (PROAIR HFA) 108 (90 Base) MCG/ACT inhaler Inhale 2 puffs into the lungs every 6 (six) hours as needed for wheezing or shortness of breath. 1 Inhaler 3  . buPROPion (WELLBUTRIN) 75 MG tablet Take 1 tablet (75 mg total) by mouth every morning. 90 tablet 0  . cetirizine (ZYRTEC) 10 MG tablet TAKE 1 TABLET BY MOUTH AT BEDTIME FOR ALLERGIES 30 tablet 3  .  chlorzoxazone (PARAFON FORTE DSC) 500 MG tablet Take one tab po bid for back spasm 30 tablet 0  . diazepam (VALIUM) 5 MG tablet INSERT 1 TABLET VAGINALLY ONCE DAILY AT BEDTIME 30 tablet 3  . docusate sodium (DOK) 100 MG capsule TAKE 1 CAPSULE BY MOUTH TWICE DAILY FOR STOOL SOFTENER 60 capsule 3  . EPINEPHrine 0.3 mg/0.3 mL IJ SOAJ injection USE FOR ANAPHYLAXIS 1 Device 5  . FLOVENT HFA 220 MCG/ACT inhaler INHALE 2 PUFFS INTO THE LUNGS, 2 TIMES PER DAY. RINSE MOUTH AFTER INHALATIONS 12 g 11  . fluticasone (FLONASE) 50 MCG/ACT nasal spray USE 1 SPRAY INTO EACH NOSTRIL ONCE DAILYFOR CONGESTION OR ALLERGIES 48 g 3  . guaiFENesin (ROBITUSSIN) 100 MG/5ML SOLN Take 5 mLs (100 mg total) by mouth every 4 (four) hours as needed for cough or to loosen phlegm. 180 mL 0  . hydrOXYzine (ATARAX/VISTARIL) 10 MG tablet Take 1 tablet (10 mg total) by mouth 2 (two) times daily as needed for anxiety. 60 tablet 1  . ibuprofen (ADVIL) 600 MG tablet ONE TAB PO BID PRN FOR PAIN AND HEADACHES 30 tablet 3  . omeprazole (PRILOSEC) 40 MG capsule TAKE 1 CAPSULE BY MOUTH TWICE DAILY 60 capsule 6  . topiramate (TOPAMAX) 50 MG tablet TAKE 1 TABLET BY MOUTH EVERY EVENING 30 tablet 3  . traMADol (ULTRAM) 50 MG tablet ONE TAB PO BID PRN FOR PAIN AND HEADCAHES 20 tablet 3  . traZODone (DESYREL) 50 MG tablet Take 0.5 tablets (25 mg total) by mouth at bedtime. 45 tablet 0   No current facility-administered medications for this visit.     Musculoskeletal: Strength & Muscle Tone: UTA Gait & Station: normal Patient leans: N/A  Psychiatric Specialty Exam: Review of Systems  Psychiatric/Behavioral: Positive for sleep disturbance. Negative for agitation, behavioral problems, confusion, decreased concentration, dysphoric mood, hallucinations, self-injury and suicidal ideas. The patient is not  nervous/anxious and is not hyperactive.   All other systems reviewed and are negative.   Last menstrual period 01/05/2016.There is no height  or weight on file to calculate BMI.  General Appearance: Casual  Eye Contact:  Fair  Speech:  Normal Rate  Volume:  Normal  Mood:  Euthymic  Affect:  Congruent  Thought Process:  Goal Directed and Descriptions of Associations: Intact  Orientation:  Full (Time, Place, and Person)  Thought Content: Logical   Suicidal Thoughts:  No  Homicidal Thoughts:  No  Memory:  Immediate;   Fair Recent;   Fair Remote;   Fair  Judgement:  Fair  Insight:  Fair  Psychomotor Activity:  Normal  Concentration:  Concentration: Fair and Attention Span: Fair  Recall:  [employer] of Knowledge: Fair  Language: Fair  Akathisia:  No  Handed:  Right  AIMS (if indicated):UTA  Assets:  Communication Skills Desire for Improvement Housing Social Support  ADL's:  Intact  Cognition: WNL  Sleep:  Feels groggy on medication   Screenings: Mini-Mental     Clinical Support from 12/19/2019 in [facility], [facility] Office Visit from 10/13/2017 in [facility], [facility]  Total Score (max 30 points ) 30 27    PHQ2-9     Clinical Support from 12/19/2019 in [facility], [facility] Video Visit from 12/18/2019 in [place] from 11/07/2019 in [place] Visit from 06/13/2019 in [facility], [facility] Office Visit from 02/14/2019 in [facility], [facility]  PHQ-2 Total Score 0 6 4 0 0  PHQ-9 Total Score -- 16 9 -- --       Assessment and Plan: Teresa Crawford is a 34 year old Caucasian female, single, currently works part-time, on [employer], has a history of MDD, PTSD, insomnia was evaluated by telemedicine today.  Patient is currently struggling with adverse side effects to her trazodone.  Discussed plan as noted below.  Plan MDD-improving Wellbutrin 75 mg p.o. daily Reduce trazodone to 25 mg p.o. nightly Continue CBT with Ms.Hussami  PTSD-stable Continue CBT  Insomnia-improving However since patient has  adverse side effects to the higher dose of trazodone, we will reduce trazodone to 25 mg p.o. nightly  Bereavement-improving Continue CBT  Follow-up in clinic in 2 weeks or sooner if needed.  I have spent atleast 20 minutes face to face by video with patient today. More than 50 % of the time was spent for preparing to see the patient ( e.g., review of test, records ),  ordering medications and test ,psychoeducation and supportive psychotherapy and care coordination,as well as documenting clinical information in electronic health record. This note was generated in part or whole with voice recognition software. Voice recognition is usually quite accurate but there are transcription errors that can and very often do occur. I apologize for any typographical errors that were not detected and corrected.        Ursula Alert, MD 01/10/2020, 5:16 PM

## 2020-01-16 ENCOUNTER — Telehealth: Payer: Self-pay

## 2020-01-16 NOTE — Telephone Encounter (Signed)
Patient aware paperwork is ready for pick up, I have at my desk to be copied and scanned. Teresa Crawford

## 2020-01-22 ENCOUNTER — Other Ambulatory Visit: Payer: Self-pay

## 2020-01-22 ENCOUNTER — Encounter: Payer: Self-pay | Admitting: Psychiatry

## 2020-01-22 ENCOUNTER — Telehealth (INDEPENDENT_AMBULATORY_CARE_PROVIDER_SITE_OTHER): Payer: Medicare Other | Admitting: Psychiatry

## 2020-01-22 DIAGNOSIS — Z634 Disappearance and death of family member: Secondary | ICD-10-CM

## 2020-01-22 DIAGNOSIS — F4310 Post-traumatic stress disorder, unspecified: Secondary | ICD-10-CM | POA: Diagnosis not present

## 2020-01-22 DIAGNOSIS — F5105 Insomnia due to other mental disorder: Secondary | ICD-10-CM | POA: Diagnosis not present

## 2020-01-22 DIAGNOSIS — F3342 Major depressive disorder, recurrent, in full remission: Secondary | ICD-10-CM

## 2020-01-22 NOTE — Progress Notes (Signed)
Provider Location : [facility] Patient Location : Home  Participants: Patient , Provider  Virtual Visit via Telephone Note  I connected with Teresa Crawford on 01/22/20 at 11:40 AM EDT by telephone and verified that I am speaking with the correct person using two identifiers.   I discussed the limitations, risks, security and privacy concerns of performing an evaluation and management service by telephone and the availability of in person appointments. I also discussed with the patient that there may be a patient responsible charge related to this service. The patient expressed understanding and agreed to proceed.     I discussed the assessment and treatment plan with the patient. The patient was provided an opportunity to ask questions and all were answered. The patient agreed with the plan and demonstrated an understanding of the instructions.   The patient was advised to call back or seek an in-person evaluation if the symptoms worsen or if the condition fails to improve as anticipated.   [place] MD OP Progress Note  01/22/2020 12:58 PM Teresa Crawford  MRN:  [id number]  Chief Complaint:  Chief Complaint    Follow-up     HPI: Teresa Crawford is a 34 year old Caucasian female who is single, lives in [place], has a history of MDD, PTSD, insomnia, bereavement was evaluated by phone today.  Patient today reports she is currently doing well.  Denies any significant mood symptoms.  She reports she is compliant on the Wellbutrin which helps.  Denies side effects.  She reports sleep is good.  She takes the trazodone as needed.  She reports she loves her work and stays busy and that does help with her mood.  Patient denies any suicidality, homicidality or perceptual disturbances.  Patient denies any other concerns today.  Visit Diagnosis:    ICD-10-CM   1. MDD (major depressive disorder), recurrent, in full remission ([place])  F33.42   2. Posttraumatic stress disorder  F43.10   3.  Insomnia due to mental condition  F51.05   4. Bereavement  Z63.4     Past Psychiatric History: I have reviewed past psychiatric history from my progress note on 08/16/2018  Past Medical History:  Past Medical History:  Diagnosis Date  . B12 deficiency anemia   . Chronic back pain   . Chronic fatigue syndrome   . Constipation   . Depression   . Elevated liver enzymes   . Endometriosis   . Fibroids   . Hepatic steatosis   . Hypothyroid   . Last menstrual period (LMP) > 10 days ago 09/12/15  . Mental retardation   . Migraine   . Post traumatic stress disorder   . Posttraumatic stress disorder   . PUD (peptic ulcer disease)   . Scoliosis   . Splenomegaly   . Uterine fibroid     Past Surgical History:  Procedure Laterality Date  . ABDOMINAL HYSTERECTOMY    . BACK SURGERY    . COLONOSCOPY  04/27/2006   Wohl-colitis, internal hemorrhoids  . COLONOSCOPY  02/2013   focal right colitis, left colon biopsy negative, focal active proctitis without chronicity  . ESOPHAGOGASTRODUODENOSCOPY  02/2013   multiple peptic & duodenal ulcers, negative h pylori  . INTRAUTERINE DEVICE INSERTION  2012  . LAPAROSCOPIC ENDOMETRIOSIS FULGURATION  2015   [name]  . OVARIAN CYST REMOVAL      Family Psychiatric History: I have reviewed family psychiatric history from my progress note on 08/16/2018  Family History:  Family History  Problem Relation Age of  Onset  . Breast cancer Mother 74  . COPD Father   . Lung cancer Father 6  . Dementia Father   . Diabetes Sister   . Diabetes Brother   . Ovarian cancer Paternal Aunt   . Colon cancer Maternal Grandfather 53  . Liver disease Maternal Aunt        ?etiology  . Lymphoma Maternal Grandmother 60  . Lung cancer Other 42       maternal great grandfather  . Stomach cancer Other 8       maternal great grandmother    Social History: I have reviewed social history from my progress note on 08/16/2018 Social History   Socioeconomic History  .  Marital status: Single    Spouse name: Not on file  . Number of children: 0  . Years of education: Not on file  . Highest education level: Not on file  Occupational History  . Not on file  Tobacco Use  . Smoking status: Never Smoker  . Smokeless tobacco: Never Used  Vaping Use  . Vaping Use: Never used  Substance and Sexual Activity  . Alcohol use: Not Currently    Alcohol/week: 0.0 standard drinks  . Drug use: No  . Sexual activity: Not Currently  Other Topics Concern  . Not on file  Social History Narrative   She has brother, half brother & sister live nearby   Both parents deceased   Care managed by [name]   Social Determinants of Health   Financial Resource Strain:   . Difficulty of Paying Living Expenses: Not on file  Food Insecurity:   . Worried About Charity fundraiser in the Last Year: Not on file  . Ran Out of Food in the Last Year: Not on file  Transportation Needs:   . Lack of Transportation (Medical): Not on file  . Lack of Transportation (Non-Medical): Not on file  Physical Activity:   . Days of Exercise per Week: Not on file  . Minutes of Exercise per Session: Not on file  Stress:   . Feeling of Stress : Not on file  Social Connections:   . Frequency of Communication with Friends and Family: Not on file  . Frequency of Social Gatherings with Friends and Family: Not on file  . Attends Religious Services: Not on file  . Active Member of Clubs or Organizations: Not on file  . Attends Archivist Meetings: Not on file  . Marital Status: Not on file    Allergies:  Allergies  Allergen Reactions  . Other Anaphylaxis    Peanut butter, uses Epi pen  . Peanut Butter Flavor Swelling    Throat swells Throat swells  . Sertraline Other (See Comments)    Panic attacks  . Codeine Nausea And Vomiting    nausea  . Paroxetine Other (See Comments)    Panic Attack   . Paroxetine Hcl Other (See Comments)    Panic attack Other reaction(s): Other  (See Comments) Panic attacks  . Venlafaxine Other (See Comments)    Panic Attack   . Zoloft [Sertraline Hcl] Other (See Comments)    Panic attack    Metabolic Disorder Labs: Lab Results  Component Value Date   HGBA1C 5.5 12/20/2019   No results found for: PROLACTIN Lab Results  Component Value Date   CHOL 169 12/20/2019   TRIG 117 12/20/2019   HDL 34 (L) 12/20/2019   LDLCALC 114 (H) 12/20/2019   LDLCALC 100 (H) 12/09/2018  Lab Results  Component Value Date   TSH 3.820 12/20/2019   TSH 2.740 12/09/2018    Therapeutic Level Labs: No results found for: LITHIUM No results found for: VALPROATE No components found for:  CBMZ  Current Medications: Current Outpatient Medications  Medication Sig Dispense Refill  . acetaminophen (TYLENOL) 500 MG tablet 2 TABS PO BID PRN FOR HEADACHES AND PAINS 45 tablet 3  . albuterol (PROAIR HFA) 108 (90 Base) MCG/ACT inhaler Inhale 2 puffs into the lungs every 6 (six) hours as needed for wheezing or shortness of breath. 1 Inhaler 3  . buPROPion (WELLBUTRIN) 75 MG tablet Take 1 tablet (75 mg total) by mouth every morning. 90 tablet 0  . cetirizine (ZYRTEC) 10 MG tablet TAKE 1 TABLET BY MOUTH AT BEDTIME FOR ALLERGIES 30 tablet 3  . chlorzoxazone (PARAFON FORTE DSC) 500 MG tablet Take one tab po bid for back spasm 30 tablet 0  . diazepam (VALIUM) 5 MG tablet INSERT 1 TABLET VAGINALLY ONCE DAILY AT BEDTIME 30 tablet 3  . docusate sodium (DOK) 100 MG capsule TAKE 1 CAPSULE BY MOUTH TWICE DAILY FOR STOOL SOFTENER 60 capsule 3  . EPINEPHrine 0.3 mg/0.3 mL IJ SOAJ injection USE FOR ANAPHYLAXIS 1 Device 5  . FLOVENT HFA 220 MCG/ACT inhaler INHALE 2 PUFFS INTO THE LUNGS, 2 TIMES PER DAY. RINSE MOUTH AFTER INHALATIONS 12 g 11  . fluticasone (FLONASE) 50 MCG/ACT nasal spray USE 1 SPRAY INTO EACH NOSTRIL ONCE DAILYFOR CONGESTION OR ALLERGIES 48 g 3  . guaiFENesin (ROBITUSSIN) 100 MG/5ML SOLN Take 5 mLs (100 mg total) by mouth every 4 (four) hours as needed  for cough or to loosen phlegm. 180 mL 0  . ibuprofen (ADVIL) 600 MG tablet ONE TAB PO BID PRN FOR PAIN AND HEADACHES 30 tablet 3  . omeprazole (PRILOSEC) 40 MG capsule TAKE 1 CAPSULE BY MOUTH TWICE DAILY 60 capsule 6  . topiramate (TOPAMAX) 50 MG tablet TAKE 1 TABLET BY MOUTH EVERY EVENING 30 tablet 3  . traMADol (ULTRAM) 50 MG tablet ONE TAB PO BID PRN FOR PAIN AND HEADCAHES 20 tablet 3  . traZODone (DESYREL) 50 MG tablet Take 0.5 tablets (25 mg total) by mouth at bedtime. 45 tablet 0   No current facility-administered medications for this visit.     Musculoskeletal: Strength & Muscle Tone: UTA Gait & Station: UTA Patient leans: N/A  Psychiatric Specialty Exam: Review of Systems  Psychiatric/Behavioral: Negative for agitation, behavioral problems, confusion, decreased concentration, dysphoric mood, hallucinations, self-injury, sleep disturbance and suicidal ideas. The patient is not nervous/anxious and is not hyperactive.   All other systems reviewed and are negative.   Last menstrual period 01/05/2016.There is no height or weight on file to calculate BMI.  General Appearance: UTA  Eye Contact:  UTA  Speech:  Clear and Coherent  Volume:  Normal  Mood:  Euthymic  Affect:  UTA  Thought Process:  Goal Directed and Descriptions of Associations: Intact  Orientation:  Full (Time, Place, and Person)  Thought Content: Logical   Suicidal Thoughts:  No  Homicidal Thoughts:  No  Memory:  Immediate;   Fair Recent;   Fair Remote;   Fair  Judgement:  Fair  Insight:  Fair  Psychomotor Activity:  UTA  Concentration:  Concentration: Fair and Attention Span: Fair  Recall:  [employer] of Knowledge: Fair  Language: Fair  Akathisia:  No  Handed:  Right  AIMS (if indicated): UTA  Assets:  Communication Skills Desire for Improvement Housing Social  Support  ADL's:  Intact  Cognition: WNL  Sleep:  Fair   Screenings: Mini-Mental     Clinical Support from 12/19/2019 in [facility], [facility] Office Visit from 10/13/2017 in [facility], [facility]  Total Score (max 30 points ) 30 27    PHQ2-9     Clinical Support from 12/19/2019 in [facility], [facility] Video Visit from 12/18/2019 in [place] from 11/07/2019 in [place] Visit from 06/13/2019 in [facility], [facility] Office Visit from 02/14/2019 in [facility], [facility]  PHQ-2 Total Score 0 6 4 0 0  PHQ-9 Total Score -- 16 9 -- --       Assessment and Plan: Teresa Crawford is a 34 year old Caucasian female, single, currently works part-time, on [employer], has a history of MDD, PTSD, insomnia was evaluated by phone today.  Patient is currently doing well on the current medication regimen.  Plan as noted below.  Plan MDD-in remission Wellbutrin 75 mg p.o. daily Trazodone 25 mg p.o. nightly-reduced dosage Continue CBT with Ms.Hussami   PTSD-stable Continue CBT  Insomnia-stable Continue trazodone at reduced dose of 25 mg p.o. nightly  Bereavement-stable Continue CBT  Follow-up in clinic in 1 month or sooner if needed.  I have spent atleast 20 minutes non face to face with patient today. More than 50 % of the time was spent for preparing to see the patient ( e.g., review of test, records ), ordering medications and test ,psychoeducation and supportive psychotherapy and care coordination,as well as documenting clinical information in electronic health record. This note was generated in part or whole with voice recognition software. Voice recognition is usually quite accurate but there are transcription errors that can and very often do occur. I apologize for any typographical errors that were not detected and corrected.     Ursula Alert, MD 01/22/2020, 12:58 PM

## 2020-02-05 ENCOUNTER — Ambulatory Visit: Payer: Medicare Other | Admitting: Licensed Clinical Social Worker

## 2020-02-07 NOTE — Progress Notes (Signed)
Scanned in pt transportation exception forms

## 2020-02-09 ENCOUNTER — Ambulatory Visit (INDEPENDENT_AMBULATORY_CARE_PROVIDER_SITE_OTHER): Payer: Self-pay | Admitting: Licensed Clinical Social Worker

## 2020-02-09 ENCOUNTER — Other Ambulatory Visit: Payer: Self-pay

## 2020-02-09 DIAGNOSIS — Z5329 Procedure and treatment not carried out because of patient's decision for other reasons: Secondary | ICD-10-CM

## 2020-02-09 NOTE — Progress Notes (Signed)
LCSW counselor tried to connect with patient for scheduled appointment via MyChart video text request x 2; also tried to connect via phone without success. LCSW counselor left message for patient to call office number to reschedule OPT appointment.  

## 2020-02-23 ENCOUNTER — Other Ambulatory Visit: Payer: Self-pay

## 2020-02-23 ENCOUNTER — Encounter: Payer: Self-pay | Admitting: Psychiatry

## 2020-02-23 ENCOUNTER — Telehealth (INDEPENDENT_AMBULATORY_CARE_PROVIDER_SITE_OTHER): Payer: Medicare Other | Admitting: Psychiatry

## 2020-02-23 DIAGNOSIS — Z634 Disappearance and death of family member: Secondary | ICD-10-CM

## 2020-02-23 DIAGNOSIS — F3342 Major depressive disorder, recurrent, in full remission: Secondary | ICD-10-CM

## 2020-02-23 DIAGNOSIS — F4310 Post-traumatic stress disorder, unspecified: Secondary | ICD-10-CM | POA: Diagnosis not present

## 2020-02-23 DIAGNOSIS — F5105 Insomnia due to other mental disorder: Secondary | ICD-10-CM | POA: Diagnosis not present

## 2020-02-23 NOTE — Progress Notes (Signed)
Virtual Visit via Video Note  I connected with Teresa Crawford on 02/23/20 at 11:40 AM EST by a video enabled telemedicine application and verified that I am speaking with the correct person using two identifiers.  Location Provider Location : [facility] Patient Location : Home  Participants: Patient , Provider    I discussed the limitations of evaluation and management by telemedicine and the availability of in person appointments. The patient expressed understanding and agreed to proceed.  I discussed the assessment and treatment plan with the patient. The patient was provided an opportunity to ask questions and all were answered. The patient agreed with the plan and demonstrated an understanding of the instructions.  The patient was advised to call back or seek an in-person evaluation if the symptoms worsen or if the condition fails to improve as anticipated.   Teresa Crawford OP Progress Note  02/23/2020 11:59 AM Teresa Crawford  MRN:  [id number]  Chief Complaint:  Chief Complaint    Follow-up     HPI: Teresa Crawford is a 34 year old Caucasian female who is single, lives in [place], has a history of MDD, PTSD, insomnia, bereavement was evaluated by telemedicine today.  Patient today reports she is currently busy with her work.  She enjoys it.  She reports she is doing well with regards to her mood.  Denies any sadness, crying spells.  She reports sleep as good on the trazodone.  She is tolerating the trazodone and the Wellbutrin.  She missed her last appointment with her therapist, reports she completely forgot.  She however agrees to call back to schedule another appointment.  She reports she is planning to spend her Thanksgiving with her cousin and her sister who is coming over from [place].  She reports her sister is planning to move here to [facility] and she is excited about that.  Patient denies any suicidality, homicidality or perceptual disturbances.  Patient  denies any other concerns today.  Visit Diagnosis:    ICD-10-CM   1. MDD (major depressive disorder), recurrent, in full remission ([place])  F33.42   2. Posttraumatic stress disorder  F43.10   3. Insomnia due to mental condition  F51.05   4. Bereavement  Z63.4     Past Psychiatric History: I have reviewed past psychiatric history from my progress note on 08/16/2018  Past Medical History:  Past Medical History:  Diagnosis Date  . B12 deficiency anemia   . Chronic back pain   . Chronic fatigue syndrome   . Constipation   . Depression   . Elevated liver enzymes   . Endometriosis   . Fibroids   . Hepatic steatosis   . Hypothyroid   . Last menstrual period (LMP) > 10 days ago 09/12/15  . Mental retardation   . Migraine   . Post traumatic stress disorder   . Posttraumatic stress disorder   . PUD (peptic ulcer disease)   . Scoliosis   . Splenomegaly   . Uterine fibroid     Past Surgical History:  Procedure Laterality Date  . ABDOMINAL HYSTERECTOMY    . BACK SURGERY    . COLONOSCOPY  04/27/2006   Wohl-colitis, internal hemorrhoids  . COLONOSCOPY  02/2013   focal right colitis, left colon biopsy negative, focal active proctitis without chronicity  . ESOPHAGOGASTRODUODENOSCOPY  02/2013   multiple peptic & duodenal ulcers, negative h pylori  . INTRAUTERINE DEVICE INSERTION  2012  . LAPAROSCOPIC ENDOMETRIOSIS FULGURATION  2015   [name]  . OVARIAN CYST REMOVAL  Family Psychiatric History: Reviewed family psychiatric history from my progress note on 08/16/2018  Family History:  Family History  Problem Relation Age of Onset  . Breast cancer Mother 24  . COPD Father   . Lung cancer Father 38  . Dementia Father   . Diabetes Sister   . Diabetes Brother   . Ovarian cancer Paternal Aunt   . Colon cancer Maternal Grandfather 76  . Liver disease Maternal Aunt        ?etiology  . Lymphoma Maternal Grandmother 60  . Lung cancer Other 56       maternal great grandfather  .  Stomach cancer Other 27       maternal great grandmother    Social History: Reviewed social history from my progress note on 08/16/2018 Social History   Socioeconomic History  . Marital status: Single    Spouse name: Not on file  . Number of children: 0  . Years of education: Not on file  . Highest education level: Not on file  Occupational History  . Not on file  Tobacco Use  . Smoking status: Never Smoker  . Smokeless tobacco: Never Used  Vaping Use  . Vaping Use: Never used  Substance and Sexual Activity  . Alcohol use: Not Currently    Alcohol/week: 0.0 standard drinks  . Drug use: No  . Sexual activity: Not Currently  Other Topics Concern  . Not on file  Social History Narrative   She has brother, half brother & sister live nearby   Both parents deceased   Care managed by [name]   Social Determinants of Health   Financial Resource Strain:   . Difficulty of Paying Living Expenses: Not on file  Food Insecurity:   . Worried About Charity fundraiser in the Last Year: Not on file  . Ran Out of Food in the Last Year: Not on file  Transportation Needs:   . Lack of Transportation (Medical): Not on file  . Lack of Transportation (Non-Medical): Not on file  Physical Activity:   . Days of Exercise per Week: Not on file  . Minutes of Exercise per Session: Not on file  Stress:   . Feeling of Stress : Not on file  Social Connections:   . Frequency of Communication with Friends and Family: Not on file  . Frequency of Social Gatherings with Friends and Family: Not on file  . Attends Religious Services: Not on file  . Active Member of Clubs or Organizations: Not on file  . Attends Archivist Meetings: Not on file  . Marital Status: Not on file    Allergies:  Allergies  Allergen Reactions  . Other Anaphylaxis    Peanut butter, uses Epi pen  . Peanut Butter Flavor Swelling    Throat swells Throat swells  . Sertraline Other (See Comments)    Panic  attacks  . Codeine Nausea And Vomiting    nausea  . Paroxetine Other (See Comments)    Panic Attack   . Paroxetine Hcl Other (See Comments)    Panic attack Other reaction(s): Other (See Comments) Panic attacks  . Venlafaxine Other (See Comments)    Panic Attack   . Zoloft [Sertraline Hcl] Other (See Comments)    Panic attack    Metabolic Disorder Labs: Lab Results  Component Value Date   HGBA1C 5.5 12/20/2019   No results found for: PROLACTIN Lab Results  Component Value Date   CHOL 169 12/20/2019  TRIG 117 12/20/2019   HDL 34 (L) 12/20/2019   LDLCALC 114 (H) 12/20/2019   LDLCALC 100 (H) 12/09/2018   Lab Results  Component Value Date   TSH 3.820 12/20/2019   TSH 2.740 12/09/2018    Therapeutic Level Labs: No results found for: LITHIUM No results found for: VALPROATE No components found for:  CBMZ  Current Medications: Current Outpatient Medications  Medication Sig Dispense Refill  . acetaminophen (TYLENOL) 500 MG tablet 2 TABS PO BID PRN FOR HEADACHES AND PAINS 45 tablet 3  . albuterol (PROAIR HFA) 108 (90 Base) MCG/ACT inhaler Inhale 2 puffs into the lungs every 6 (six) hours as needed for wheezing or shortness of breath. 1 Inhaler 3  . buPROPion (WELLBUTRIN) 75 MG tablet Take 1 tablet (75 mg total) by mouth every morning. 90 tablet 0  . cetirizine (ZYRTEC) 10 MG tablet TAKE 1 TABLET BY MOUTH AT BEDTIME FOR ALLERGIES 30 tablet 3  . chlorzoxazone (PARAFON FORTE DSC) 500 MG tablet Take one tab po bid for back spasm 30 tablet 0  . diazepam (VALIUM) 5 MG tablet INSERT 1 TABLET VAGINALLY ONCE DAILY AT BEDTIME 30 tablet 3  . docusate sodium (DOK) 100 MG capsule TAKE 1 CAPSULE BY MOUTH TWICE DAILY FOR STOOL SOFTENER 60 capsule 3  . EPINEPHrine 0.3 mg/0.3 mL IJ SOAJ injection USE FOR ANAPHYLAXIS 1 Device 5  . FLOVENT HFA 220 MCG/ACT inhaler INHALE 2 PUFFS INTO THE LUNGS, 2 TIMES PER DAY. RINSE MOUTH AFTER INHALATIONS 12 g 11  . fluticasone (FLONASE) 50 MCG/ACT nasal  spray USE 1 SPRAY INTO EACH NOSTRIL ONCE DAILYFOR CONGESTION OR ALLERGIES 48 g 3  . guaiFENesin (ROBITUSSIN) 100 MG/5ML SOLN Take 5 mLs (100 mg total) by mouth every 4 (four) hours as needed for cough or to loosen phlegm. 180 mL 0  . ibuprofen (ADVIL) 600 MG tablet ONE TAB PO BID PRN FOR PAIN AND HEADACHES 30 tablet 3  . omeprazole (PRILOSEC) 40 MG capsule TAKE 1 CAPSULE BY MOUTH TWICE DAILY 60 capsule 6  . topiramate (TOPAMAX) 50 MG tablet TAKE 1 TABLET BY MOUTH EVERY EVENING 30 tablet 3  . traMADol (ULTRAM) 50 MG tablet ONE TAB PO BID PRN FOR PAIN AND HEADCAHES 20 tablet 3  . traZODone (DESYREL) 50 MG tablet Take 0.5 tablets (25 mg total) by mouth at bedtime. 45 tablet 0   No current facility-administered medications for this visit.     Musculoskeletal: Strength & Muscle Tone: UTA Gait & Station: normal Patient leans: N/A  Psychiatric Specialty Exam: Review of Systems  Psychiatric/Behavioral: Negative for agitation, behavioral problems, confusion, decreased concentration, dysphoric mood, hallucinations, self-injury, sleep disturbance and suicidal ideas. The patient is not nervous/anxious and is not hyperactive.   All other systems reviewed and are negative.   Last menstrual period 01/05/2016.There is no height or weight on file to calculate BMI.  General Appearance: Casual  Eye Contact:  Fair  Speech:  Clear and Coherent  Volume:  Normal  Mood:  Euthymic  Affect:  Congruent  Thought Process:  Goal Directed and Descriptions of Associations: Intact  Orientation:  Full (Time, Place, and Person)  Thought Content: Logical   Suicidal Thoughts:  No  Homicidal Thoughts:  No  Memory:  Immediate;   Fair Recent;   Fair Remote;   Fair  Judgement:  Fair  Insight:  Fair  Psychomotor Activity:  Normal  Concentration:  Concentration: Fair and Attention Span: Fair  Recall:  [employer] of Knowledge: Fair  Language: Fair  Akathisia:  No  Handed:  Right  AIMS (if indicated): UTA  Assets:   Communication Skills Desire for Improvement Housing Social Support  ADL's:  Intact  Cognition: WNL  Sleep:  Fair   Screenings: Mini-Mental     Clinical Support from 12/19/2019 in [facility], [facility] Office Visit from 10/13/2017 in [facility], [facility]  Total Score (max 30 points ) 30 27    PHQ2-9     Clinical Support from 12/19/2019 in [facility], [facility] Video Visit from 12/18/2019 in [place] from 11/07/2019 in [place] Visit from 06/13/2019 in [facility], [facility] Office Visit from 02/14/2019 in [facility], [facility]  PHQ-2 Total Score 0 6 4 0 0  PHQ-9 Total Score -- 16 9 -- --       Assessment and Plan: SHELLYE ZANDI is a 34 year old Caucasian female, single, currently works part-time, on [employer], has a history of MDD, PTSD, insomnia was evaluated by telemedicine today.  Patient is currently stable on current medication regimen.  Plan MDD in remission Wellbutrin 75 mg p.o. daily Trazodone 25 mg p.o. nightly-reduced dosage. Continue CBT with Ms. Christina Hussami -patient advised to reach out to schedule an appointment.  PTSD-stable Continue CBT  Insomnia-stable Trazodone at reduced dosage of 25 mg p.o. nightly.   Bereavement-stable Continue CBT  Follow-up in clinic in 2 months or sooner if needed.  I have spent atleast 20 minutes face to face by video with patient today. More than 50 % of the time was spent for preparing to see the patient ( e.g., review of test, records ), ordering medications and test ,psychoeducation and supportive psychotherapy and care coordination,as well as documenting clinical information in electronic health record. This note was generated in part or whole with voice recognition software. Voice recognition is usually quite accurate but there are transcription errors that can and very often do occur. I apologize for any  typographical errors that were not detected and corrected.       Ursula Alert, MD 02/23/2020, 11:59 AM

## 2020-03-05 ENCOUNTER — Encounter: Payer: Self-pay | Admitting: Internal Medicine

## 2020-03-05 ENCOUNTER — Emergency Department
Admission: EM | Admit: 2020-03-05 | Discharge: 2020-03-05 | Disposition: A | Discharge status: Home / Self Care | Payer: Medicare Other | Attending: Emergency Medicine | Admitting: Emergency Medicine

## 2020-03-05 ENCOUNTER — Other Ambulatory Visit: Payer: Self-pay

## 2020-03-05 ENCOUNTER — Emergency Department: Payer: Medicare Other

## 2020-03-05 ENCOUNTER — Ambulatory Visit (INDEPENDENT_AMBULATORY_CARE_PROVIDER_SITE_OTHER): Payer: Medicare Other | Admitting: Internal Medicine

## 2020-03-05 VITALS — BP 115/80 | HR 84 | Temp 97.6°F | Resp 16 | Ht 64.0 in | Wt 209.0 lb

## 2020-03-05 DIAGNOSIS — N200 Calculus of kidney: Secondary | ICD-10-CM

## 2020-03-05 DIAGNOSIS — R112 Nausea with vomiting, unspecified: Secondary | ICD-10-CM | POA: Diagnosis not present

## 2020-03-05 DIAGNOSIS — R1031 Right lower quadrant pain: Secondary | ICD-10-CM

## 2020-03-05 DIAGNOSIS — E039 Hypothyroidism, unspecified: Secondary | ICD-10-CM | POA: Insufficient documentation

## 2020-03-05 DIAGNOSIS — R109 Unspecified abdominal pain: Secondary | ICD-10-CM

## 2020-03-05 LAB — CBC
HCT: 38.2 % (ref 36.0–46.0)
Hemoglobin: 12.9 g/dL (ref 12.0–15.0)
MCH: 29.7 pg (ref 26.0–34.0)
MCHC: 33.8 g/dL (ref 30.0–36.0)
MCV: 88 fL (ref 80.0–100.0)
Platelets: 192 10*3/uL (ref 150–400)
RBC: 4.34 MIL/uL (ref 3.87–5.11)
RDW: 14 % (ref 11.5–15.5)
WBC: 6.4 10*3/uL (ref 4.0–10.5)
nRBC: 0 % (ref 0.0–0.2)

## 2020-03-05 LAB — URINALYSIS, COMPLETE (UACMP) WITH MICROSCOPIC
Bilirubin Urine: NEGATIVE
Glucose, UA: NEGATIVE mg/dL
Ketones, ur: 5 mg/dL — AB
Nitrite: NEGATIVE
Protein, ur: NEGATIVE mg/dL
Specific Gravity, Urine: 1.019 (ref 1.005–1.030)
pH: 6 (ref 5.0–8.0)

## 2020-03-05 LAB — COMPREHENSIVE METABOLIC PANEL
ALT: 16 U/L (ref 0–44)
AST: 16 U/L (ref 15–41)
Albumin: 3.9 g/dL (ref 3.5–5.0)
Alkaline Phosphatase: 77 U/L (ref 38–126)
Anion gap: 10 (ref 5–15)
BUN: 13 mg/dL (ref 6–20)
CO2: 24 mmol/L (ref 22–32)
Calcium: 8.7 mg/dL — ABNORMAL LOW (ref 8.9–10.3)
Chloride: 108 mmol/L (ref 98–111)
Creatinine, Ser: 0.73 mg/dL (ref 0.44–1.00)
GFR, Estimated: >60 mL/min (ref >60)
Glucose, Bld: 103 mg/dL — ABNORMAL HIGH (ref 70–99)
Potassium: 3.6 mmol/L (ref 3.5–5.1)
Sodium: 142 mmol/L (ref 135–145)
Total Bilirubin: 0.9 mg/dL (ref 0.3–1.2)
Total Protein: 7.4 g/dL (ref 6.5–8.1)

## 2020-03-05 LAB — LIPASE, BLOOD: Lipase: 20 U/L (ref 11–51)

## 2020-03-05 MED ORDER — LACTATED RINGERS IV BOLUS
1000.0000 mL | Freq: Once | INTRAVENOUS | Status: AC
  Administered 2020-03-05: 1000 mL via INTRAVENOUS

## 2020-03-05 MED ORDER — PROMETHAZINE HCL 25 MG/ML IJ SOLN
12.5000 mg | Freq: Once | INTRAMUSCULAR | Status: AC
  Administered 2020-03-05: 12.5 mg via INTRAVENOUS
  Filled 2020-03-05: qty 1

## 2020-03-05 MED ORDER — IOHEXOL 300 MG/ML  SOLN
100.0000 mL | Freq: Once | INTRAMUSCULAR | Status: AC | PRN
  Administered 2020-03-05: 100 mL via INTRAVENOUS
  Filled 2020-03-05: qty 100

## 2020-03-05 MED ORDER — KETOROLAC TROMETHAMINE 30 MG/ML IJ SOLN
15.0000 mg | Freq: Once | INTRAMUSCULAR | Status: AC
  Administered 2020-03-05: 15 mg via INTRAVENOUS
  Filled 2020-03-05: qty 1

## 2020-03-05 MED ORDER — PROMETHAZINE HCL 12.5 MG PO TABS: 12.5000 mg | ORAL_TABLET | Freq: Four times a day (QID) | ORAL | 0 refills | Status: DC | PRN

## 2020-03-05 MED ORDER — OXYCODONE-ACETAMINOPHEN 5-325 MG PO TABS
1.0000 | ORAL_TABLET | ORAL | 0 refills | Status: DC | PRN
Start: 2020-03-05 — End: 2020-05-21

## 2020-03-05 NOTE — ED Provider Notes (Signed)
[facility] Emergency Department Provider Note   ____________________________________________   First MD Initiated Contact with Patient 03/05/20 1243     (approximate)  I have reviewed the triage vital signs and the nursing notes.   HISTORY  Chief Complaint Abdominal Pain    HPI Teresa Crawford is a 34 y.o. female with past medical history of major depressive disorder, generalized anxiety disorder, and endometriosis status post hysterectomy who presents to the ED complaining of abdominal pain. Patient reports that she has been dealing with pain in the right right side of her abdomen for the past 3 days. Pain is constant and sharp, not exacerbated or alleviated by anything. She has had associated nausea and vomiting, states it has been difficult for her to keep down even liquids. She has had occasional diarrhea with this, but denies any blood in her emesis or stool. She has not had any fevers, dysuria, hematuria, vaginal bleeding, or vaginal discharge. She was seen by her PCP earlier today and referred to the ED due to concern for gallstones versus appendicitis.        Past Medical History:  Diagnosis Date  . B12 deficiency anemia   . Chronic back pain   . Chronic fatigue syndrome   . Constipation   . Depression   . Elevated liver enzymes   . Endometriosis   . Fibroids   . Hepatic steatosis   . Hypothyroid   . Last menstrual period (LMP) > 10 days ago 09/12/15  . Mental retardation   . Migraine   . Post traumatic stress disorder   . Posttraumatic stress disorder   . PUD (peptic ulcer disease)   . Scoliosis   . Splenomegaly   . Uterine fibroid     Patient Active Problem List   Diagnosis Date Noted  . Encounter for general adult medical examination with abnormal findings 01/10/2020  . Needs flu shot 01/10/2020  . Allergic rhinitis due to pollen 06/17/2019  . Generalized anxiety disorder 06/17/2019  . MDD (major depressive disorder),  recurrent, in full remission ([place]) 04/13/2019  . Routine cervical smear 12/14/2018  . Urinary tract infection with hematuria 12/14/2018  . Dysuria 12/14/2018  . MDD (major depressive disorder), recurrent, in partial remission ([place]) 10/06/2018  . Insomnia due to mental condition 10/06/2018  . Other headache syndrome 08/03/2017  . Constipation, unspecified 04/16/2017  . Nontoxic single thyroid nodule 04/16/2017  . Migraine without aura, not intractable, without status migrainosus 04/16/2017  . Other obesity due to excess calories 04/16/2017  . Seizures ([place]) 03/31/2017  . Seizure-like activity ([place]) 02/02/2017  . Pressure injury of skin 12/30/2016  . Scoliosis 12/28/2016  . Hypothyroidism 12/28/2016  . Lower extremity weakness 12/28/2016  . Self-inflicted laceration of wrist ([place]) 04/07/2016  . MDD (major depressive disorder), recurrent episode, moderate ([place]) 10/29/2014  . Posttraumatic stress disorder 10/29/2014  . Bereavement 10/29/2014  . CFIDS (chronic fatigue and immune dysfunction syndrome) ([place]) 09/28/2014  . Aggrieved 09/28/2014  . Depression, major, recurrent ([place]) 09/28/2014  . Intellectual disability 09/28/2014  . Neurosis, posttraumatic 09/28/2014  . History of colitis 09/22/2014  . Hepatic steatosis 09/20/2014  . Endometriosis 08/30/2013    Past Surgical History:  Procedure Laterality Date  . ABDOMINAL HYSTERECTOMY    . BACK SURGERY    . COLONOSCOPY  04/27/2006   Wohl-colitis, internal hemorrhoids  . COLONOSCOPY  02/2013   focal right colitis, left colon biopsy negative, focal active proctitis without chronicity  . ESOPHAGOGASTRODUODENOSCOPY  02/2013   multiple  peptic & duodenal ulcers, negative h pylori  . INTRAUTERINE DEVICE INSERTION  2012  . LAPAROSCOPIC ENDOMETRIOSIS FULGURATION  2015   [name]  . OVARIAN CYST REMOVAL      Prior to Admission medications   Medication Sig Start Date End Date Taking? Authorizing Provider  acetaminophen (TYLENOL) 500 MG  tablet 2 TABS PO BID PRN FOR HEADACHES AND PAINS 05/31/18   Lavera Guise, MD  albuterol [facility] HFA) 108 [phone] Base) MCG/ACT inhaler Inhale 2 puffs into the lungs every 6 (six) hours as needed for wheezing or shortness of breath. 05/31/18   Lavera Guise, MD  buPROPion [facility]) 75 MG tablet Take 1 tablet (75 mg total) by mouth every morning. 01/10/20   Ursula Alert, MD  cetirizine (ZYRTEC) 10 MG tablet TAKE 1 TABLET BY MOUTH AT BEDTIME FOR ALLERGIES 12/22/19   Ronnell Freshwater, NP  chlorzoxazone (PARAFON FORTE DSC) 500 MG tablet Take one tab po bid for back spasm 09/12/19   Lavera Guise, MD  diazepam (VALIUM) 5 MG tablet INSERT 1 TABLET VAGINALLY ONCE DAILY AT BEDTIME 10/27/19   Lavera Guise, MD  docusate sodium (DOK) 100 MG capsule TAKE 1 CAPSULE BY MOUTH TWICE DAILY FOR STOOL SOFTENER 11/20/19   Lavera Guise, MD  EPINEPHrine 0.3 mg/0.3 mL IJ SOAJ injection USE FOR ANAPHYLAXIS 06/14/17   Lavera Guise, MD  FLOVENT HFA 220 MCG/ACT inhaler INHALE 2 PUFFS INTO THE LUNGS, 2 TIMES PER DAY. RINSE MOUTH AFTER INHALATIONS 12/09/18   Boscia, Greer Ee, NP  fluticasone (FLONASE) 50 MCG/ACT nasal spray USE 1 SPRAY INTO EACH NOSTRIL ONCE DAILYFOR CONGESTION OR ALLERGIES 06/13/19   Ronnell Freshwater, NP  guaiFENesin (ROBITUSSIN) 100 MG/5ML SOLN Take 5 mLs (100 mg total) by mouth every 4 (four) hours as needed for cough or to loosen phlegm. 10/23/19   Kendell Bane, NP  ibuprofen (ADVIL) 600 MG tablet ONE TAB PO BID PRN FOR PAIN AND HEADACHES 06/13/19   Ronnell Freshwater, NP  omeprazole (PRILOSEC) 40 MG capsule TAKE 1 CAPSULE BY MOUTH TWICE DAILY 08/21/19   Ronnell Freshwater, NP  oxyCODONE-acetaminophen (PERCOCET) 5-325 MG tablet Take 1 tablet by mouth every 4 (four) hours as needed for severe pain. 03/05/20 03/05/21  Blake Divine, MD  promethazine (PHENERGAN) 12.5 MG tablet Take 1 tablet (12.5 mg total) by mouth every 6 (six) hours as needed for nausea or vomiting. 03/05/20   Blake Divine, MD  topiramate  (TOPAMAX) 50 MG tablet TAKE 1 TABLET BY MOUTH EVERY EVENING 12/22/19   Ronnell Freshwater, NP  traMADol (ULTRAM) 50 MG tablet ONE TAB PO BID PRN FOR PAIN AND HEADCAHES 12/19/19   Ronnell Freshwater, NP  traZODone (DESYREL) 50 MG tablet Take 0.5 tablets (25 mg total) by mouth at bedtime. 01/10/20   Ursula Alert, MD    Allergies Other, Peanut butter flavor, Sertraline, Codeine, Paroxetine, Paroxetine hcl, Venlafaxine, and Zoloft [sertraline hcl]  Family History  Problem Relation Age of Onset  . Breast cancer Mother 43  . COPD Father   . Lung cancer Father 34  . Dementia Father   . Diabetes Sister   . Diabetes Brother   . Ovarian cancer Paternal Aunt   . Colon cancer Maternal Grandfather 28  . Liver disease Maternal Aunt        ?etiology  . Lymphoma Maternal Grandmother 60  . Lung cancer Other 81       maternal great grandfather  . Stomach cancer Other 31  maternal great grandmother    Social History Social History   Tobacco Use  . Smoking status: Never Smoker  . Smokeless tobacco: Never Used  Vaping Use  . Vaping Use: Never used  Substance Use Topics  . Alcohol use: Not Currently    Alcohol/week: 0.0 standard drinks  . Drug use: No    Review of Systems  Constitutional: No fever/chills Eyes: No visual changes. ENT: No sore throat. Cardiovascular: Denies chest pain. Respiratory: Denies shortness of breath. Gastrointestinal: Positive for abdominal pain, nausea, vomiting, and diarrhea.  No constipation. Genitourinary: Negative for dysuria. Musculoskeletal: Negative for back pain. Skin: Negative for rash. Neurological: Negative for headaches, focal weakness or numbness.  ____________________________________________   PHYSICAL EXAM:  VITAL SIGNS: ED Triage Vitals  Enc Vitals Group     BP 03/05/20 1216 109/73     Pulse Rate 03/05/20 1216 66     Resp 03/05/20 1216 16     Temp 03/05/20 1216 98.5 F (36.9 C)     Temp Source 03/05/20 1216 Oral     SpO2  03/05/20 1216 99 %     Weight 03/05/20 1147 209 lb (94.8 kg)     Height 03/05/20 1147 5\' 4"  (1.626 m)     Head Circumference --      Peak Flow --      Pain Score 03/05/20 1147 8     Pain Loc --      Pain Edu? --      Excl. in [place]? --     Constitutional: Alert and oriented. Eyes: Conjunctivae are normal. Head: Atraumatic. Nose: No congestion/rhinnorhea. Mouth/Throat: Mucous membranes are dry. Neck: Normal ROM Cardiovascular: Normal rate, regular rhythm. Grossly normal heart sounds. Respiratory: Normal respiratory effort.  No retractions. Lungs CTAB. Gastrointestinal: Soft and tender to palpation in the right lower quadrant with no rebound or guarding. No distention. Genitourinary: deferred Musculoskeletal: No lower extremity tenderness nor edema. Neurologic:  Normal speech and language. No gross focal neurologic deficits are appreciated. Skin:  Skin is warm, dry and intact. No rash noted. Psychiatric: Mood and affect are normal. Speech and behavior are normal.  ____________________________________________   LABS (all labs ordered are listed, but only abnormal results are displayed)  Labs Reviewed  COMPREHENSIVE METABOLIC PANEL - Abnormal; Notable for the following components:      Result Value   Glucose, Bld 103 (*)    Calcium 8.7 (*)    All other components within normal limits  URINALYSIS, COMPLETE (UACMP) WITH MICROSCOPIC - Abnormal; Notable for the following components:   Color, Urine YELLOW (*)    APPearance HAZY (*)    Hgb urine dipstick MODERATE (*)    Ketones, ur 5 (*)    Leukocytes,Ua TRACE (*)    Bacteria, UA FEW (*)    All other components within normal limits  URINE CULTURE  LIPASE, BLOOD  CBC    PROCEDURES  Procedure(s) performed (including Critical Care):  Procedures   ____________________________________________   INITIAL IMPRESSION / ASSESSMENT AND PLAN / ED COURSE       34 year old female with past medical history of major depressive  disorder, generalized anxiety disorder, and endometriosis status post hysterectomy who presents to the ED complaining of constant right-sided abdominal pain for the past 3 days associated with vomiting and diarrhea. She has focal tenderness in the right lower quadrant of her abdomen and we will further assess for appendicitis with CT scan. Lab work and UA are pending and for now we will treat patient's symptoms  with IV Toradol and Phenergan.  CT scan shows small obstructing right-sided ureterolith, likely the cause of patient's symptoms. UA not consistent with infection and patient reports symptoms are much improved following Toradol and Phenergan. She has been able to tolerate p.o. without difficulty and is appropriate for discharge home with urology follow-up. Patient currently resides at assisted living facility similar to group home and staff were notified of findings as well as her prescriptions and need to follow-up with urology. Patient provided with paperwork detailing prescriptions and she was counseled to return to the ED for new or worsening symptoms. Patient agrees with plan.      ____________________________________________   FINAL CLINICAL IMPRESSION(S) / ED DIAGNOSES  Final diagnoses:  Kidney stone  RLQ abdominal pain  Non-intractable vomiting with nausea, unspecified vomiting type     ED Discharge Orders         Ordered    promethazine (PHENERGAN) 12.5 MG tablet  Every 6 hours PRN        03/05/20 1521    oxyCODONE-acetaminophen (PERCOCET) 5-325 MG tablet  Every 4 hours PRN        03/05/20 1521           Note:  This document was prepared using Dragon voice recognition software and may include unintentional dictation errors.   Blake Divine, MD 03/05/20 [phone]

## 2020-03-05 NOTE — ED Notes (Signed)
Spoke with representative at [facility] about discharge instructions and for ride home. Representative stated that they will be here to pick up pt within 30 minutes.

## 2020-03-05 NOTE — Progress Notes (Signed)
[facility] [place], [place] [address]  Internal MEDICINE  Office Visit Note  Patient Name: Teresa Crawford  [id number]  [id number]  Date of Service: 03/05/2020  Chief Complaint  Patient presents with  . Acute Visit  . Hip Pain    right side  . Vomiting  . controlled substance form    reviewed with PT    HPI Pt is here for acute and sick visit. C/O right sided abdominal pain, localized towards the right side since Sunday. She also has been vomiting  She is feeling weak and dizzy, unable to keep anything down.  Current Medication: Outpatient Encounter Medications as of 03/05/2020  Medication Sig  . acetaminophen (TYLENOL) 500 MG tablet 2 TABS PO BID PRN FOR HEADACHES AND PAINS  . albuterol (PROAIR HFA) 108 (90 Base) MCG/ACT inhaler Inhale 2 puffs into the lungs every 6 (six) hours as needed for wheezing or shortness of breath.  Marland Kitchen buPROPion (WELLBUTRIN) 75 MG tablet Take 1 tablet (75 mg total) by mouth every morning.  . cetirizine (ZYRTEC) 10 MG tablet TAKE 1 TABLET BY MOUTH AT BEDTIME FOR ALLERGIES  . chlorzoxazone (PARAFON FORTE DSC) 500 MG tablet Take one tab po bid for back spasm  . diazepam (VALIUM) 5 MG tablet INSERT 1 TABLET VAGINALLY ONCE DAILY AT BEDTIME  . docusate sodium (DOK) 100 MG capsule TAKE 1 CAPSULE BY MOUTH TWICE DAILY FOR STOOL SOFTENER  . EPINEPHrine 0.3 mg/0.3 mL IJ SOAJ injection USE FOR ANAPHYLAXIS  . FLOVENT HFA 220 MCG/ACT inhaler INHALE 2 PUFFS INTO THE LUNGS, 2 TIMES PER DAY. RINSE MOUTH AFTER INHALATIONS  . fluticasone (FLONASE) 50 MCG/ACT nasal spray USE 1 SPRAY INTO EACH NOSTRIL ONCE DAILYFOR CONGESTION OR ALLERGIES  . guaiFENesin (ROBITUSSIN) 100 MG/5ML SOLN Take 5 mLs (100 mg total) by mouth every 4 (four) hours as needed for cough or to loosen phlegm.  Marland Kitchen ibuprofen (ADVIL) 600 MG tablet ONE TAB PO BID PRN FOR PAIN AND HEADACHES  . omeprazole (PRILOSEC) 40 MG capsule TAKE 1 CAPSULE BY MOUTH TWICE DAILY  . topiramate  (TOPAMAX) 50 MG tablet TAKE 1 TABLET BY MOUTH EVERY EVENING  . traMADol (ULTRAM) 50 MG tablet ONE TAB PO BID PRN FOR PAIN AND HEADCAHES  . traZODone (DESYREL) 50 MG tablet Take 0.5 tablets (25 mg total) by mouth at bedtime.   No facility-administered encounter medications on file as of 03/05/2020.    Surgical History: Past Surgical History:  Procedure Laterality Date  . ABDOMINAL HYSTERECTOMY    . BACK SURGERY    . COLONOSCOPY  04/27/2006   Wohl-colitis, internal hemorrhoids  . COLONOSCOPY  02/2013   focal right colitis, left colon biopsy negative, focal active proctitis without chronicity  . ESOPHAGOGASTRODUODENOSCOPY  02/2013   multiple peptic & duodenal ulcers, negative h pylori  . INTRAUTERINE DEVICE INSERTION  2012  . LAPAROSCOPIC ENDOMETRIOSIS FULGURATION  2015   [name]  . OVARIAN CYST REMOVAL      Medical History: Past Medical History:  Diagnosis Date  . B12 deficiency anemia   . Chronic back pain   . Chronic fatigue syndrome   . Constipation   . Depression   . Elevated liver enzymes   . Endometriosis   . Fibroids   . Hepatic steatosis   . Hypothyroid   . Last menstrual period (LMP) > 10 days ago 09/12/15  . Mental retardation   . Migraine   . Post traumatic stress disorder   . Posttraumatic stress disorder   .  PUD (peptic ulcer disease)   . Scoliosis   . Splenomegaly   . Uterine fibroid     Family History: Family History  Problem Relation Age of Onset  . Breast cancer Mother 2  . COPD Father   . Lung cancer Father 30  . Dementia Father   . Diabetes Sister   . Diabetes Brother   . Ovarian cancer Paternal Aunt   . Colon cancer Maternal Grandfather 19  . Liver disease Maternal Aunt        ?etiology  . Lymphoma Maternal Grandmother 60  . Lung cancer Other 33       maternal great grandfather  . Stomach cancer Other 71       maternal great grandmother    Social History   Socioeconomic History  . Marital status: Single    Spouse name: Not on file   . Number of children: 0  . Years of education: Not on file  . Highest education level: Not on file  Occupational History  . Not on file  Tobacco Use  . Smoking status: Never Smoker  . Smokeless tobacco: Never Used  Vaping Use  . Vaping Use: Never used  Substance and Sexual Activity  . Alcohol use: Not Currently    Alcohol/week: 0.0 standard drinks  . Drug use: No  . Sexual activity: Not Currently  Other Topics Concern  . Not on file  Social History Narrative   She has brother, half brother & sister live nearby   Both parents deceased   Care managed by [name]   Social Determinants of Health   Financial Resource Strain:   . Difficulty of Paying Living Expenses: Not on file  Food Insecurity:   . Worried About Charity fundraiser in the Last Year: Not on file  . Ran Out of Food in the Last Year: Not on file  Transportation Needs:   . Lack of Transportation (Medical): Not on file  . Lack of Transportation (Non-Medical): Not on file  Physical Activity:   . Days of Exercise per Week: Not on file  . Minutes of Exercise per Session: Not on file  Stress:   . Feeling of Stress : Not on file  Social Connections:   . Frequency of Communication with Friends and Family: Not on file  . Frequency of Social Gatherings with Friends and Family: Not on file  . Attends Religious Services: Not on file  . Active Member of Clubs or Organizations: Not on file  . Attends Archivist Meetings: Not on file  . Marital Status: Not on file  Intimate Partner Violence:   . Fear of Current or Ex-Partner: Not on file  . Emotionally Abused: Not on file  . Physically Abused: Not on file  . Sexually Abused: Not on file      Review of Systems  Constitutional: Negative for chills, diaphoresis and fatigue.  HENT: Negative for ear pain, postnasal drip and sinus pressure.   Eyes: Negative for photophobia, discharge, redness, itching and visual disturbance.  Respiratory: Negative for  cough, shortness of breath and wheezing.   Cardiovascular: Negative for chest pain, palpitations and leg swelling.  Gastrointestinal: Positive for abdominal pain, nausea and vomiting. Negative for constipation and diarrhea.  Genitourinary: Negative for dysuria and flank pain.  Musculoskeletal: Negative for arthralgias, back pain, gait problem and neck pain.  Skin: Negative for color change.  Allergic/Immunologic: Negative for environmental allergies and food allergies.  Neurological: Negative for dizziness and headaches.  Hematological: Does not bruise/bleed easily.  Psychiatric/Behavioral: Negative for agitation, behavioral problems (depression) and hallucinations.    Vital Signs: BP 115/80   Pulse 84   Temp 97.6 F (36.4 C)   Resp 16   Ht 5\' 4"  (1.626 m)   Wt 209 lb (94.8 kg)   LMP 01/05/2016   SpO2 98%   BMI 35.87 kg/m    Physical Exam Cardiovascular:     Rate and Rhythm: Normal rate and regular rhythm.     Pulses: Normal pulses.     Heart sounds: Normal heart sounds.  Abdominal:     Tenderness: There is abdominal tenderness.        Assessment/Plan: 1. Acute abdominal pain Pt is sent to ED for evaluation and treatment   General Counseling: makayia duplessis understanding of the findings of todays visit and agrees with plan of treatment. I have discussed any further diagnostic evaluation that may be needed or ordered today. We also reviewed her medications today. she has been encouraged to call the office with any questions or concerns that should arise related to todays visit.  Total time spent  20 Minutes Time spent includes review of chart, medications, test results, and follow up plan with the patient.      Dr Lavera Guise Internal medicine

## 2020-03-05 NOTE — Discharge Instructions (Signed)
Your pain and vomiting appears due to a kidney stone that is moving down from your kidney to your bladder on the right side.  You can take the Percocet as needed for pain and the Phenergan as needed for nausea and vomiting.  Please schedule follow-up with a urologist, you have been provided with Dr. Dene Gentry contact information.  If you have worsening symptoms such as fever, pain, burning when you urinate, or vomiting that is not alleviated by the medicine, please return to the ER for reevaluation.

## 2020-03-05 NOTE — ED Triage Notes (Signed)
Pt c/o right sided abd pain with N/V/D for the past 3 days. States her PCP referred her to the ED.

## 2020-03-05 NOTE — ED Notes (Signed)
Pt returned from CT via stretcher.

## 2020-03-06 LAB — URINE CULTURE

## 2020-03-08 ENCOUNTER — Other Ambulatory Visit: Payer: Self-pay

## 2020-03-08 ENCOUNTER — Ambulatory Visit (INDEPENDENT_AMBULATORY_CARE_PROVIDER_SITE_OTHER): Payer: Medicare Other | Admitting: Urology

## 2020-03-08 ENCOUNTER — Encounter: Payer: Self-pay | Admitting: Urology

## 2020-03-08 VITALS — BP 116/81 | HR 76 | Ht 64.0 in | Wt 212.9 lb

## 2020-03-08 DIAGNOSIS — N201 Calculus of ureter: Secondary | ICD-10-CM | POA: Diagnosis not present

## 2020-03-08 DIAGNOSIS — N23 Unspecified renal colic: Secondary | ICD-10-CM

## 2020-03-08 MED ORDER — TAMSULOSIN HCL 0.4 MG PO CAPS: 0.4000 mg | ORAL_CAPSULE | Freq: Every day | ORAL | 0 refills | Status: DC

## 2020-03-08 MED ORDER — FLOVENT HFA 220 MCG/ACT IN AERO
INHALATION_SPRAY | RESPIRATORY_TRACT | 11 refills | Status: DC
Start: 2020-03-08 — End: 2020-12-25

## 2020-03-08 NOTE — Progress Notes (Signed)
03/08/2020 1:54 PM   Teresa Crawford 08-Nov-1985 [id number]  Referring provider: Lavera Guise, [address] [place],  [place] [address]  Chief Complaint  Patient presents with  . Nephrolithiasis    HPI: Teresa Crawford is a 34 y.o. female who presents for ED follow-up of right lower quadrant abdominal pain.  She presents today with her caregiver from [name]   Presented to ED 03/05/2020 with 3-day history of progressively worsening right lower quadrant abdominal pain  No identifiable precipitating, aggravating or alleviating factors  + Nausea/vomiting  No bothersome LUTS  No dysuria or gross hematuria  CT abdomen pelvis with contrast with a 2 mm calcification right distal ureter with mild hydronephrosis/hydroureter  No prior history stone disease or similar pain  Pain has been persistent but not as severe as her initial presentation  Was prescribed analgesics and antiemetic but no tamsulosin   PMH: Past Medical History:  Diagnosis Date  . B12 deficiency anemia   . Chronic back pain   . Chronic fatigue syndrome   . Constipation   . Depression   . Elevated liver enzymes   . Endometriosis   . Fibroids   . Hepatic steatosis   . Hypothyroid   . Last menstrual period (LMP) > 10 days ago 09/12/15  . Mental retardation   . Migraine   . Post traumatic stress disorder   . Posttraumatic stress disorder   . PUD (peptic ulcer disease)   . Scoliosis   . Splenomegaly   . Uterine fibroid     Surgical History: Past Surgical History:  Procedure Laterality Date  . ABDOMINAL HYSTERECTOMY    . BACK SURGERY    . COLONOSCOPY  04/27/2006   Wohl-colitis, internal hemorrhoids  . COLONOSCOPY  02/2013   focal right colitis, left colon biopsy negative, focal active proctitis without chronicity  . ESOPHAGOGASTRODUODENOSCOPY  02/2013   multiple peptic & duodenal ulcers, negative h pylori  . INTRAUTERINE DEVICE INSERTION  2012  . LAPAROSCOPIC ENDOMETRIOSIS FULGURATION   2015   [name]  . OVARIAN CYST REMOVAL      Home Medications:  Allergies as of 03/08/2020      Reactions   Other Anaphylaxis   Peanut butter, uses Epi pen   Peanut Butter Flavor Swelling   Throat swells Throat swells   Sertraline Other (See Comments)   Panic attacks   Codeine Nausea And Vomiting   nausea   Paroxetine Other (See Comments)   Panic Attack   Paroxetine Hcl Other (See Comments)   Panic attack Other reaction(s): Other (See Comments) Panic attacks   Venlafaxine Other (See Comments)   Panic Attack   Zoloft [sertraline Hcl] Other (See Comments)   Panic attack      Medication List       Accurate as of March 08, 2020  1:54 PM. If you have any questions, ask your nurse or doctor.        acetaminophen 500 MG tablet Commonly known as: TYLENOL 2 TABS PO BID PRN FOR HEADACHES AND PAINS   albuterol 108 (90 Base) MCG/ACT inhaler Commonly known as: ProAir HFA Inhale 2 puffs into the lungs every 6 (six) hours as needed for wheezing or shortness of breath.   buPROPion 75 MG tablet Commonly known as: WELLBUTRIN Take 1 tablet (75 mg total) by mouth every morning.   cetirizine 10 MG tablet Commonly known as: ZYRTEC TAKE 1 TABLET BY MOUTH AT BEDTIME FOR ALLERGIES   chlorzoxazone 500 MG tablet Commonly known as:  Parafon Forte DSC Take one tab po bid for back spasm   diazepam 5 MG tablet Commonly known as: VALIUM INSERT 1 TABLET VAGINALLY ONCE DAILY AT BEDTIME   docusate sodium 100 MG capsule Commonly known as: DOK TAKE 1 CAPSULE BY MOUTH TWICE DAILY FOR STOOL SOFTENER   EPINEPHrine 0.3 mg/0.3 mL Soaj injection Commonly known as: EPI-PEN USE FOR ANAPHYLAXIS   Flovent HFA 220 MCG/ACT inhaler Generic drug: fluticasone INHALE 2 PUFFS INTO THE LUNGS, 2 TIMES PER DAY. RINSE MOUTH AFTER INHALATIONS   fluticasone 50 MCG/ACT nasal spray Commonly known as: FLONASE USE 1 SPRAY INTO EACH NOSTRIL ONCE DAILYFOR CONGESTION OR ALLERGIES   guaiFENesin 100 MG/5ML  Soln Commonly known as: ROBITUSSIN Take 5 mLs (100 mg total) by mouth every 4 (four) hours as needed for cough or to loosen phlegm.   ibuprofen 600 MG tablet Commonly known as: ADVIL ONE TAB PO BID PRN FOR PAIN AND HEADACHES   omeprazole 40 MG capsule Commonly known as: PRILOSEC TAKE 1 CAPSULE BY MOUTH TWICE DAILY   oxyCODONE-acetaminophen 5-325 MG tablet Commonly known as: Percocet Take 1 tablet by mouth every 4 (four) hours as needed for severe pain.   promethazine 12.5 MG tablet Commonly known as: PHENERGAN Take 1 tablet (12.5 mg total) by mouth every 6 (six) hours as needed for nausea or vomiting.   topiramate 50 MG tablet Commonly known as: TOPAMAX TAKE 1 TABLET BY MOUTH EVERY EVENING   traMADol 50 MG tablet Commonly known as: ULTRAM ONE TAB PO BID PRN FOR PAIN AND HEADCAHES   traZODone 50 MG tablet Commonly known as: DESYREL Take 0.5 tablets (25 mg total) by mouth at bedtime.       Allergies:  Allergies  Allergen Reactions  . Other Anaphylaxis    Peanut butter, uses Epi pen  . Peanut Butter Flavor Swelling    Throat swells Throat swells  . Sertraline Other (See Comments)    Panic attacks  . Codeine Nausea And Vomiting    nausea  . Paroxetine Other (See Comments)    Panic Attack   . Paroxetine Hcl Other (See Comments)    Panic attack Other reaction(s): Other (See Comments) Panic attacks  . Venlafaxine Other (See Comments)    Panic Attack   . Zoloft [Sertraline Hcl] Other (See Comments)    Panic attack    Family History: Family History  Problem Relation Age of Onset  . Breast cancer Mother 4  . COPD Father   . Lung cancer Father 53  . Dementia Father   . Diabetes Sister   . Diabetes Brother   . Ovarian cancer Paternal Aunt   . Colon cancer Maternal Grandfather 45  . Liver disease Maternal Aunt        ?etiology  . Lymphoma Maternal Grandmother 60  . Lung cancer Other 74       maternal great grandfather  . Stomach cancer Other 69        maternal great grandmother    Social History:  reports that she has never smoked. She has never used smokeless tobacco. She reports previous alcohol use. She reports that she does not use drugs.   Physical Exam: BP 116/81   Pulse 76   Ht 5\' 4"  (1.626 m)   Wt 212 lb 14.4 oz (96.6 kg)   LMP 01/05/2016   BMI 36.54 kg/m   Constitutional:  Alert, No acute distress. HEENT: [place] AT, moist mucus membranes.  Trachea midline, no masses. Cardiovascular: No clubbing, cyanosis, or  edema. Respiratory: Normal respiratory effort, no increased work of breathing.   Pertinent Imaging: CT images reviewed and interpreted  Assessment & Plan:    1.  Right lower quadrant abdominal pain  CT with probable 2 mm right distal ureteral calculus  We discussed a calculus the size has an excellent chance of passing  Recommended medical expulsion therapy and Rx tamsulosin sent to pharmacy  [place] follow-up 2 weeks and instructed to call earlier for worsening pain  Ureteroscopy was discussed for persistent symptoms   Abbie Sons, MD  [facility] [address] [place], [place] [address] [phone]

## 2020-03-12 ENCOUNTER — Encounter: Payer: Self-pay | Admitting: *Deleted

## 2020-03-19 ENCOUNTER — Other Ambulatory Visit: Payer: Self-pay

## 2020-03-19 ENCOUNTER — Ambulatory Visit (INDEPENDENT_AMBULATORY_CARE_PROVIDER_SITE_OTHER): Payer: Medicare Other | Admitting: Licensed Clinical Social Worker

## 2020-03-19 ENCOUNTER — Other Ambulatory Visit: Payer: Self-pay | Admitting: Psychiatry

## 2020-03-19 ENCOUNTER — Other Ambulatory Visit: Payer: Self-pay | Admitting: Internal Medicine

## 2020-03-19 DIAGNOSIS — F331 Major depressive disorder, recurrent, moderate: Secondary | ICD-10-CM

## 2020-03-19 DIAGNOSIS — F4310 Post-traumatic stress disorder, unspecified: Secondary | ICD-10-CM

## 2020-03-19 NOTE — Progress Notes (Signed)
Virtual Visit via Video Note  I connected with [name] on 03/19/20 at 11:00 AM EST by a video enabled telemedicine application and verified that I am speaking with the correct person using two identifiers.  Location: Patient: home Provider: [facility]   I discussed the limitations of evaluation and management by telemedicine and the availability of in person appointments. The patient expressed understanding and agreed to proceed.   The patient was advised to call back or seek an in-person evaluation if the symptoms worsen or if the condition fails to improve as anticipated.  I provided 30 minutes of non-face-to-face time during this encounter.   Melik Blancett R Alphonsus Doyel, LCSW    THERAPIST PROGRESS NOTE  Session Time: 11-11:30a  Participation Level: Active  Behavioral Response: Neat and Well GroomedAlertAnxious, Depressed and Irritable  Type of Therapy: Individual Therapy  Treatment Goals addressed: Anxiety and Coping  Interventions: Supportive  Summary: Teresa Crawford is a 34 y.o. female who presents with symptoms consistent with PTSD. Pt currently presents with a lot of pain associated with an acute kidney stone event. Pt is currently taking medication to help pass the stone but stone is not passing. Pt has upcoming appt with urologist on Thursday (2 days). Pt reports that she has a lot of anxiety triggered by this event because of the possibility of needing surgery to remove the stone.   Denys reports that she is compliant with medication and that her mood is overall stable; sleep quality and quantity is fluctuating depending on pain levels. Pt reports more irritability due to high pain levels and more anxiety due to uncertainty of kidney stone treatment and if surgery may be needed.   Allowed pt to explore and express thoughts and feelings about current life events and circumstances--pt is happy that sister is moving to [place] from [place] to be closer to her. Pt also expressed  significant amount of anxiety because she is having to take time off work for the kidney stone. Pt reports that boss is understanding but pt misses coworkers and the work itself.  Discussed current relationship issues--discussed Georgina Snell (fiance) and Will (ex boyfriend). Pt recently found out that the two of them were discussing HER with each other, and pt is feeling some distress over it.    Encouraged pt to be open and honest with Tommi Rumps about relationship with him and with others. Encouraged pt to reflect within self and understand what makes her happy within context of relationship. Discussed possible need to refer for couples therapy if pt feels the relationship needs that. Encouraged pt to get plenty of rest and follow doctor's recommendations as far as meds/rest/fluids/medical follow up.   Suicidal/Homicidal: No  SI, HI, or AVH reported at time of session.  Therapist Response:   Plan: Return again in 3 weeks.  Diagnosis: Axis I: Post Traumatic Stress Disorder    Axis II: No diagnosis    Rachel Bo Marshay Slates, LCSW 03/19/2020

## 2020-03-20 ENCOUNTER — Other Ambulatory Visit: Payer: Self-pay

## 2020-03-20 MED ORDER — OMEPRAZOLE 40 MG PO CPDR
40.0000 mg | DELAYED_RELEASE_CAPSULE | Freq: Two times a day (BID) | ORAL | 6 refills | Status: DC
Start: 2020-03-20 — End: 2020-09-24

## 2020-03-20 NOTE — Telephone Encounter (Signed)
Can you send these into pharmacy

## 2020-03-20 NOTE — Progress Notes (Signed)
03/21/2020 9:02 PM   Teresa Crawford November 12, 1985 [id number]  Referring provider: Lavera Guise, [place],  [place] [address]  Chief Complaint  Patient presents with  . Nephrolithiasis   Urological history 1. Nephrolithiasis - Contrast CT 02/2020 ? 2 mm stone in the distal aspect of the right ureter and a punctate 3 mm nonobstructing left stone  - treated with medical expulsion therapy  HPI: Teresa Crawford is a 34 y.o. female who presents today for a 2 week follow up.   KUB 03/21/2020 no stones seen.  She states she is currently doing well and has no complaints.  She has not captured a fragment.  Patient denies any modifying or aggravating factors.  Patient denies any gross hematuria, dysuria or suprapubic/flank pain.  Patient denies any fevers, chills, nausea or vomiting.   PMH: Past Medical History:  Diagnosis Date  . B12 deficiency anemia   . Chronic back pain   . Chronic fatigue syndrome   . Constipation   . Depression   . Elevated liver enzymes   . Endometriosis   . Fibroids   . Hepatic steatosis   . Hypothyroid   . Last menstrual period (LMP) > 10 days ago 09/12/15  . Mental retardation   . Migraine   . Post traumatic stress disorder   . Posttraumatic stress disorder   . PUD (peptic ulcer disease)   . Scoliosis   . Splenomegaly   . Uterine fibroid     Surgical History: Past Surgical History:  Procedure Laterality Date  . ABDOMINAL HYSTERECTOMY    . BACK SURGERY    . COLONOSCOPY  04/27/2006   Wohl-colitis, internal hemorrhoids  . COLONOSCOPY  02/2013   focal right colitis, left colon biopsy negative, focal active proctitis without chronicity  . ESOPHAGOGASTRODUODENOSCOPY  02/2013   multiple peptic & duodenal ulcers, negative h pylori  . INTRAUTERINE DEVICE INSERTION  2012  . LAPAROSCOPIC ENDOMETRIOSIS FULGURATION  2015   [name]  . OVARIAN CYST REMOVAL      Home Medications:  Allergies as of 03/21/2020      Reactions   Other  Anaphylaxis   Peanut butter, uses Epi pen   Peanut Butter Flavor Swelling   Throat swells Throat swells   Sertraline Other (See Comments)   Panic attacks   Codeine Nausea And Vomiting   nausea   Paroxetine Other (See Comments)   Panic Attack   Paroxetine Hcl Other (See Comments)   Panic attack Other reaction(s): Other (See Comments) Panic attacks   Venlafaxine Other (See Comments)   Panic Attack   Zoloft [sertraline Hcl] Other (See Comments)   Panic attack      Medication List       Accurate as of March 21, 2020 11:59 PM. If you have any questions, ask your nurse or doctor.        STOP taking these medications   promethazine 12.5 MG tablet Commonly known as: PHENERGAN Stopped by: Sequoyah Ramone, PA-C   tamsulosin 0.4 MG Caps capsule Commonly known as: FLOMAX Stopped by: Arek Spadafore, PA-C     TAKE these medications   acetaminophen 500 MG tablet Commonly known as: TYLENOL 2 TABS PO BID PRN FOR HEADACHES AND PAINS   albuterol 108 (90 Base) MCG/ACT inhaler Commonly known as: ProAir HFA Inhale 2 puffs into the lungs every 6 (six) hours as needed for wheezing or shortness of breath.   buPROPion 75 MG tablet Commonly known as: WELLBUTRIN TAKE 1 TABLET BY  MOUTH DAILY WITH BREAKFAST   cetirizine 10 MG tablet Commonly known as: ZYRTEC TAKE 1 TABLET BY MOUTH AT BEDTIME FOR ALLERGIES   chlorzoxazone 500 MG tablet Commonly known as: Parafon Forte DSC Take one tab po bid for back spasm   diazepam 5 MG tablet Commonly known as: VALIUM INSERT 1 TABLET VAGINALLY ONCE DAILY AT BEDTIME   docusate sodium 100 MG capsule Commonly known as: DOK TAKE 1 CAPSULE BY MOUTH TWICE DAILY FOR STOOL SOFTENER   EPINEPHrine 0.3 mg/0.3 mL Soaj injection Commonly known as: EPI-PEN USE FOR ANAPHYLAXIS   Flovent HFA 220 MCG/ACT inhaler Generic drug: fluticasone INHALE 2 PUFFS INTO THE LUNGS, 2 TIMES PER DAY. RINSE MOUTH AFTER INHALATIONS   fluticasone 50 MCG/ACT nasal  spray Commonly known as: FLONASE USE 1 SPRAY INTO EACH NOSTRIL ONCE DAILYFOR CONGESTION OR ALLERGIES   guaiFENesin 100 MG/5ML Soln Commonly known as: ROBITUSSIN Take 5 mLs (100 mg total) by mouth every 4 (four) hours as needed for cough or to loosen phlegm.   ibuprofen 600 MG tablet Commonly known as: ADVIL ONE TAB PO BID PRN FOR PAIN AND HEADACHES   omeprazole 40 MG capsule Commonly known as: PRILOSEC Take 1 capsule (40 mg total) by mouth 2 (two) times daily.   oxyCODONE-acetaminophen 5-325 MG tablet Commonly known as: Percocet Take 1 tablet by mouth every 4 (four) hours as needed for severe pain.   topiramate 50 MG tablet Commonly known as: TOPAMAX TAKE 1 TABLET BY MOUTH EVERY EVENING   traMADol 50 MG tablet Commonly known as: ULTRAM ONE TAB PO BID PRN FOR PAIN AND HEADCAHES   traZODone 50 MG tablet Commonly known as: DESYREL Take 0.5 tablets (25 mg total) by mouth at bedtime.       Allergies:  Allergies  Allergen Reactions  . Other Anaphylaxis    Peanut butter, uses Epi pen  . Peanut Butter Flavor Swelling    Throat swells Throat swells  . Sertraline Other (See Comments)    Panic attacks  . Codeine Nausea And Vomiting    nausea  . Paroxetine Other (See Comments)    Panic Attack   . Paroxetine Hcl Other (See Comments)    Panic attack Other reaction(s): Other (See Comments) Panic attacks  . Venlafaxine Other (See Comments)    Panic Attack   . Zoloft [Sertraline Hcl] Other (See Comments)    Panic attack    Family History: Family History  Problem Relation Age of Onset  . Breast cancer Mother 2  . COPD Father   . Lung cancer Father 24  . Dementia Father   . Diabetes Sister   . Diabetes Brother   . Ovarian cancer Paternal Aunt   . Colon cancer Maternal Grandfather 6  . Liver disease Maternal Aunt        ?etiology  . Lymphoma Maternal Grandmother 60  . Lung cancer Other 36       maternal great grandfather  . Stomach cancer Other 36        maternal great grandmother    Social History:  reports that she has never smoked. She has never used smokeless tobacco. She reports previous alcohol use. She reports that she does not use drugs.   Physical Exam: BP 123/86   Pulse (!) 109   Ht 5\' 3"  (1.6 m)   Wt 211 lb (95.7 kg)   LMP 01/05/2016   BMI 37.38 kg/m   Constitutional:  Well nourished. Alert and oriented, No acute distress. HEENT: [place] AT, mask  in place.  Trachea midline Cardiovascular: No clubbing, cyanosis, or edema. Respiratory: Normal respiratory effort, no increased work of breathing. Neurologic: Grossly intact, no focal deficits, moving all 4 extremities. Psychiatric: Normal mood and affect.   Pertinent Imaging: CLINICAL DATA:  Left-sided flank pain  EXAM: ABDOMEN - 1 VIEW  COMPARISON:  02/27/2013  FINDINGS: Scattered large and small bowel gas is noted. Mild retained fecal material is noted. Postsurgical changes in the thoracolumbar spine are seen. No renal or ureteral stones are identified. No acute bony abnormality is seen.  IMPRESSION: No evidence of renal or ureteral calculi.   Electronically Signed   By: Inez Catalina M.D.   On: 03/21/2020 17:19 I have independently reviewed the films.  See HPI.   Assessment & Plan:    1. Nephrolithiasis Stone not visualized on today's KUB She return in 1 month for follow-up and recheck on [place], PA-C  [facility] [address] [place], [place] [address] [phone]

## 2020-03-20 NOTE — Progress Notes (Signed)
Low vitamin d. Other labs good. Discuss at next visit

## 2020-03-21 ENCOUNTER — Other Ambulatory Visit: Payer: Self-pay

## 2020-03-21 ENCOUNTER — Ambulatory Visit (INDEPENDENT_AMBULATORY_CARE_PROVIDER_SITE_OTHER): Payer: Medicare Other | Admitting: Urology

## 2020-03-21 ENCOUNTER — Ambulatory Visit
Admission: RE | Admit: 2020-03-21 | Discharge: 2020-03-21 | Disposition: A | Discharge status: Home / Self Care | Payer: Medicare Other | Source: Ambulatory Visit | Attending: Urology | Admitting: Urology

## 2020-03-21 ENCOUNTER — Ambulatory Visit
Admission: RE | Admit: 2020-03-21 | Discharge: 2020-03-21 | Disposition: A | Discharge status: Home / Self Care | Payer: Medicare Other | Attending: Urology | Admitting: Urology

## 2020-03-21 VITALS — BP 123/86 | HR 109 | Ht 63.0 in | Wt 211.0 lb

## 2020-03-21 DIAGNOSIS — R109 Unspecified abdominal pain: Secondary | ICD-10-CM | POA: Diagnosis not present

## 2020-03-21 DIAGNOSIS — N201 Calculus of ureter: Secondary | ICD-10-CM | POA: Insufficient documentation

## 2020-03-21 DIAGNOSIS — N200 Calculus of kidney: Secondary | ICD-10-CM | POA: Diagnosis not present

## 2020-03-28 ENCOUNTER — Telehealth: Payer: Self-pay

## 2020-03-28 NOTE — Telephone Encounter (Signed)
Left message and asked pt to reschedule appointment from 04/30/20. Teresa Crawford

## 2020-04-09 ENCOUNTER — Other Ambulatory Visit: Payer: Self-pay

## 2020-04-09 ENCOUNTER — Telehealth (INDEPENDENT_AMBULATORY_CARE_PROVIDER_SITE_OTHER): Payer: Medicare Other | Admitting: Psychiatry

## 2020-04-09 ENCOUNTER — Encounter: Payer: Self-pay | Admitting: Psychiatry

## 2020-04-09 DIAGNOSIS — Z634 Disappearance and death of family member: Secondary | ICD-10-CM | POA: Diagnosis not present

## 2020-04-09 DIAGNOSIS — F4310 Post-traumatic stress disorder, unspecified: Secondary | ICD-10-CM

## 2020-04-09 DIAGNOSIS — F3342 Major depressive disorder, recurrent, in full remission: Secondary | ICD-10-CM | POA: Diagnosis not present

## 2020-04-09 DIAGNOSIS — F5105 Insomnia due to other mental disorder: Secondary | ICD-10-CM | POA: Diagnosis not present

## 2020-04-09 MED ORDER — TRAZODONE HCL 50 MG PO TABS: 25.0000 mg | ORAL_TABLET | Freq: Every day | ORAL | 0 refills | Status: DC

## 2020-04-09 NOTE — Progress Notes (Signed)
Virtual Visit via Video Note  I connected with Teresa Crawford on 04/09/20 at 10:40 AM EST by a video enabled telemedicine application and verified that I am speaking with the correct person using two identifiers.  Location Provider Location : [facility] Patient Location : Home  Participants: Patient , Provider   I discussed the limitations of evaluation and management by telemedicine and the availability of in person appointments. The patient expressed understanding and agreed to proceed.   I discussed the assessment and treatment plan with the patient. The patient was provided an opportunity to ask questions and all were answered. The patient agreed with the plan and demonstrated an understanding of the instructions.   The patient was advised to call back or seek an in-person evaluation if the symptoms worsen or if the condition fails to improve as anticipated.  [place] MD OP Progress Note  04/09/2020 10:58 AM Teresa Crawford  MRN:  [id number]  Chief Complaint:  Chief Complaint    Follow-up     HPI: Teresa Crawford is a 35 year old Caucasian female who is single, lives in [place], has a history of MDD, PTSD, insomnia, bereavement was evaluated by telemedicine today.  Patient today reports she is currently doing well.  Denies any significant sadness or anxiety symptoms.  She reports sleep as good.  She reports appetite as fair.  She reports she had a good holiday season with her family.  She reports her sister moved back here from [place].  She reports she has has been spending a lot of time with her sister and that helps.  She reports work is going well.  She currently has a reduced schedule and works 2 days a week instead of 3 days a week.  She reports that 3 days a week was getting too hectic for her and hence she opted for the 2 days a week.  It is going well right now.  She reports she had problems with renal stones recently and had to be treated for the same.  She however is  currently doing well.  She has upcoming appointment scheduled with her provider.  She is compliant on medications.  Denies side effects.    Visit Diagnosis:    ICD-10-CM   1. MDD (major depressive disorder), recurrent, in full remission ([place])  F33.42   2. Posttraumatic stress disorder  F43.10   3. Insomnia due to mental condition  F51.05 traZODone (DESYREL) 50 MG tablet  4. Bereavement  Z63.4     Past Psychiatric History: I have reviewed past psychiatric history from my progress note on 08/16/2018  Past Medical History:  Past Medical History:  Diagnosis Date  . B12 deficiency anemia   . Chronic back pain   . Chronic fatigue syndrome   . Constipation   . Depression   . Elevated liver enzymes   . Endometriosis   . Fibroids   . Hepatic steatosis   . Hypothyroid   . Last menstrual period (LMP) > 10 days ago 09/12/15  . Mental retardation   . Migraine   . Post traumatic stress disorder   . Posttraumatic stress disorder   . PUD (peptic ulcer disease)   . Scoliosis   . Splenomegaly   . Uterine fibroid     Past Surgical History:  Procedure Laterality Date  . ABDOMINAL HYSTERECTOMY    . BACK SURGERY    . COLONOSCOPY  04/27/2006   Wohl-colitis, internal hemorrhoids  . COLONOSCOPY  02/2013   focal right colitis, left  colon biopsy negative, focal active proctitis without chronicity  . ESOPHAGOGASTRODUODENOSCOPY  02/2013   multiple peptic & duodenal ulcers, negative h pylori  . INTRAUTERINE DEVICE INSERTION  2012  . LAPAROSCOPIC ENDOMETRIOSIS FULGURATION  2015   [name]  . OVARIAN CYST REMOVAL      Family Psychiatric History: I have reviewed family psychiatric history from my progress note on 08/16/2018  Family History:  Family History  Problem Relation Age of Onset  . Breast cancer Mother 71  . COPD Father   . Lung cancer Father 3  . Dementia Father   . Diabetes Sister   . Diabetes Brother   . Ovarian cancer Paternal Aunt   . Colon cancer Maternal Grandfather 31  .  Liver disease Maternal Aunt        ?etiology  . Lymphoma Maternal Grandmother 60  . Lung cancer Other 29       maternal great grandfather  . Stomach cancer Other 16       maternal great grandmother    Social History: I have reviewed social history from my progress note on 08/16/2018 Social History   Socioeconomic History  . Marital status: Single    Spouse name: Not on file  . Number of children: 0  . Years of education: Not on file  . Highest education level: Not on file  Occupational History  . Not on file  Tobacco Use  . Smoking status: Never Smoker  . Smokeless tobacco: Never Used  Vaping Use  . Vaping Use: Never used  Substance and Sexual Activity  . Alcohol use: Not Currently    Alcohol/week: 0.0 standard drinks  . Drug use: No  . Sexual activity: Not Currently  Other Topics Concern  . Not on file  Social History Narrative   She has brother, half brother & sister live nearby   Both parents deceased   Care managed by [name]   Social Determinants of Health   Financial Resource Strain: Not on file  Food Insecurity: Not on file  Transportation Needs: Not on file  Physical Activity: Not on file  Stress: Not on file  Social Connections: Not on file    Allergies:  Allergies  Allergen Reactions  . Other Anaphylaxis    Peanut butter, uses Epi pen  . Peanut Butter Flavor Swelling    Throat swells Throat swells  . Sertraline Other (See Comments)    Panic attacks  . Codeine Nausea And Vomiting    nausea  . Paroxetine Other (See Comments)    Panic Attack   . Paroxetine Hcl Other (See Comments)    Panic attack Other reaction(s): Other (See Comments) Panic attacks  . Venlafaxine Other (See Comments)    Panic Attack   . Zoloft [Sertraline Hcl] Other (See Comments)    Panic attack    Metabolic Disorder Labs: Lab Results  Component Value Date   HGBA1C 5.5 12/20/2019   No results found for: PROLACTIN Lab Results  Component Value Date   CHOL 169  12/20/2019   TRIG 117 12/20/2019   HDL 34 (L) 12/20/2019   LDLCALC 114 (H) 12/20/2019   LDLCALC 100 (H) 12/09/2018   Lab Results  Component Value Date   TSH 3.820 12/20/2019   TSH 2.740 12/09/2018    Therapeutic Level Labs: No results found for: LITHIUM No results found for: VALPROATE No components found for:  CBMZ  Current Medications: Current Outpatient Medications  Medication Sig Dispense Refill  . acetaminophen (TYLENOL) 500 MG tablet 2  TABS PO BID PRN FOR HEADACHES AND PAINS 45 tablet 3  . albuterol (PROAIR HFA) 108 (90 Base) MCG/ACT inhaler Inhale 2 puffs into the lungs every 6 (six) hours as needed for wheezing or shortness of breath. 1 Inhaler 3  . buPROPion (WELLBUTRIN) 75 MG tablet TAKE 1 TABLET BY MOUTH DAILY WITH BREAKFAST 90 tablet 0  . cetirizine (ZYRTEC) 10 MG tablet TAKE 1 TABLET BY MOUTH AT BEDTIME FOR ALLERGIES 30 tablet 3  . chlorzoxazone (PARAFON FORTE DSC) 500 MG tablet Take one tab po bid for back spasm 30 tablet 0  . diazepam (VALIUM) 5 MG tablet INSERT 1 TABLET VAGINALLY ONCE DAILY AT BEDTIME 30 tablet 2  . docusate sodium (DOK) 100 MG capsule TAKE 1 CAPSULE BY MOUTH TWICE DAILY FOR STOOL SOFTENER 60 capsule 3  . EPINEPHrine 0.3 mg/0.3 mL IJ SOAJ injection USE FOR ANAPHYLAXIS 1 Device 5  . fluticasone (FLONASE) 50 MCG/ACT nasal spray USE 1 SPRAY INTO EACH NOSTRIL ONCE DAILYFOR CONGESTION OR ALLERGIES 48 g 3  . fluticasone (FLOVENT HFA) 220 MCG/ACT inhaler INHALE 2 PUFFS INTO THE LUNGS, 2 TIMES PER DAY. RINSE MOUTH AFTER INHALATIONS 12 g 11  . guaiFENesin (ROBITUSSIN) 100 MG/5ML SOLN Take 5 mLs (100 mg total) by mouth every 4 (four) hours as needed for cough or to loosen phlegm. 180 mL 0  . ibuprofen (ADVIL) 600 MG tablet ONE TAB PO BID PRN FOR PAIN AND HEADACHES 30 tablet 3  . omeprazole (PRILOSEC) 40 MG capsule Take 1 capsule (40 mg total) by mouth 2 (two) times daily. 60 capsule 6  . oxyCODONE-acetaminophen (PERCOCET) 5-325 MG tablet Take 1 tablet by mouth  every 4 (four) hours as needed for severe pain. 12 tablet 0  . topiramate (TOPAMAX) 50 MG tablet TAKE 1 TABLET BY MOUTH EVERY EVENING 30 tablet 3  . traMADol (ULTRAM) 50 MG tablet ONE TAB PO BID PRN FOR PAIN AND HEADCAHES 20 tablet 3  . traZODone (DESYREL) 50 MG tablet Take 0.5 tablets (25 mg total) by mouth at bedtime. 45 tablet 0   No current facility-administered medications for this visit.     Musculoskeletal: Strength & Muscle Tone: UTA Gait & Station: normal Patient leans: N/A  Psychiatric Specialty Exam: Review of Systems  Psychiatric/Behavioral: Negative for agitation, behavioral problems, confusion, decreased concentration, dysphoric mood, hallucinations, self-injury, sleep disturbance and suicidal ideas. The patient is not nervous/anxious and is not hyperactive.   All other systems reviewed and are negative.   Last menstrual period 01/05/2016.There is no height or weight on file to calculate BMI.  General Appearance: Casual  Eye Contact:  Fair  Speech:  Clear and Coherent  Volume:  Normal  Mood:  Euthymic  Affect:  Congruent  Thought Process:  Goal Directed and Descriptions of Associations: Intact  Orientation:  Full (Time, Place, and Person)  Thought Content: Logical   Suicidal Thoughts:  No  Homicidal Thoughts:  No  Memory:  Immediate;   Fair Recent;   Fair Remote;   Fair  Judgement:  Fair  Insight:  Fair  Psychomotor Activity:  Normal  Concentration:  Concentration: Fair and Attention Span: Fair  Recall:  [employer] of Knowledge: Fair  Language: Fair  Akathisia:  No  Handed:  Right  AIMS (if indicated): UTA  Assets:  Communication Skills Desire for Improvement Housing Social Support  ADL's:  Intact  Cognition: WNL  Sleep:  Fair   Screenings: Mini-Mental   Flowsheet Row Clinical Support from 12/19/2019 in [facility], [facility]  Office Visit from 10/13/2017 in [facility], [facility]  Total Score (max 30 points ) 30 27    PHQ2-9    Flowsheet Row Clinical Support from 12/19/2019 in [facility], [facility] Video Visit from 12/18/2019 in [facility] Counselor from 11/07/2019 in [facility] Office Visit from 06/13/2019 in [facility], [facility] Office Visit from 02/14/2019 in [facility], [facility]  PHQ-2 Total Score 0 6 4 0 0  PHQ-9 Total Score -- 16 9 -- --       Assessment and Plan: Teresa Crawford is a 35 year old Caucasian female, single, currently works part-time, on [employer], has a history of MDD, PTSD, insomnia was evaluated by telemedicine today.  Patient is currently stable on current medication regimen.  Plan as noted below.  Plan MDD in remission Wellbutrin 75 mg p.o. daily Trazodone 25 mg p.o. nightly-reduced dosage. Continue CBT with Ms. Christina Hussami  PTSD-stable Continue CBT as needed  Insomnia-stable Trazodone at reduced dose to 25 mg p.o. nightly  Bereavement-stable Continue CBT  Follow-up in clinic in 2 months or sooner if needed.  I have spent atleast 20 minutes face to face by video with patient today. More than 50 % of the time was spent for preparing to see the patient ( e.g., review of test, records ), ordering medications and test ,psychoeducation and supportive psychotherapy and care coordination,as well as documenting clinical information in electronic health record. This note was generated in part or whole with voice recognition software. Voice recognition is usually quite accurate but there are transcription errors that can and very often do occur. I apologize for any typographical errors that were not detected and corrected.        Jomarie Longs, MD 04/10/2020, 8:22 AM

## 2020-04-16 ENCOUNTER — Ambulatory Visit: Payer: Medicare Other | Admitting: Licensed Clinical Social Worker

## 2020-04-19 ENCOUNTER — Other Ambulatory Visit: Payer: Self-pay

## 2020-04-19 MED ORDER — TOPIRAMATE 50 MG PO TABS
ORAL_TABLET | ORAL | 3 refills | Status: DC
Start: 2020-04-19 — End: 2020-08-22

## 2020-04-19 MED ORDER — CETIRIZINE HCL 10 MG PO TABS
ORAL_TABLET | ORAL | 3 refills | Status: DC
Start: 2020-04-19 — End: 2020-09-18

## 2020-04-22 ENCOUNTER — Encounter: Payer: Self-pay | Admitting: Urology

## 2020-04-23 ENCOUNTER — Ambulatory Visit: Payer: Medicare Other | Admitting: Licensed Clinical Social Worker

## 2020-04-25 ENCOUNTER — Ambulatory Visit: Payer: Medicare Other | Admitting: Urology

## 2020-04-30 ENCOUNTER — Ambulatory Visit: Payer: Medicare Other | Admitting: Internal Medicine

## 2020-05-01 ENCOUNTER — Other Ambulatory Visit: Payer: Self-pay

## 2020-05-01 ENCOUNTER — Ambulatory Visit (INDEPENDENT_AMBULATORY_CARE_PROVIDER_SITE_OTHER): Payer: Medicare Other | Admitting: Licensed Clinical Social Worker

## 2020-05-01 DIAGNOSIS — F4310 Post-traumatic stress disorder, unspecified: Secondary | ICD-10-CM

## 2020-05-01 NOTE — Progress Notes (Signed)
Virtual Visit via Video Note  I connected with Teresa Crawford on 05/01/20 at 11:00 AM EST by a video enabled telemedicine application and verified that I am speaking with the correct person using two identifiers.  Location: Patient: home Provider: remote office [place], [name]   I discussed the limitations of evaluation and management by telemedicine and the availability of in person appointments. The patient expressed understanding and agreed to proceed.   I discussed the assessment and treatment plan with the patient. The patient was provided an opportunity to ask questions and all were answered. The patient agreed with the plan and demonstrated an understanding of the instructions.   The patient was advised to call back or seek an in-person evaluation if the symptoms worsen or if the condition fails to improve as anticipated.  I provided 45 minutes of non-face-to-face time during this encounter.   Teresa Crawford R Terrace [place], LCSW    THERAPIST PROGRESS NOTE  Session Time: 11-11:45  Participation Level: Active  Behavioral Response: Neat and Well GroomedAlertAnxious  Type of Therapy: Individual Therapy  Treatment Goals addressed: Anxiety  Interventions: CBT, Supportive, Reframing and Other: trauma focused  Summary: Teresa Crawford is a 35 y.o. female who presents with symptoms associated with PTSD. Pt reports that mood has been fluctuating depending on what's going on situationally. Pt reports good quality and quantity of sleep.   Allowed pt safe space to explore and express thoughts and feelings about current life events: pt still feeling very torn between ex boyfriend (that pt has not spoken to in two years) and current boyfriend. Pt states that current boyfriend texts ex boyfriend but won't let pt read the texts. Pt feels that ex boyfriend still wants her to be in his life "we are soul mates". Pt is also engaged to current boyfriend/fiance.  Pt states that she does love current  boyfriend "in a way" but not like the love that she has for the ex boyfriend. Discussed relationships at length with Teresa Crawford and discussed expectations versus reality. Explored Teresa Crawford's expectations and the reality--that her and ex boyfriend are both currently with other people romantically.   Discussed trauma and how trauma can impact present relationships "I want to talk more about it next time".   Encouraged pt to focus on self care and life balance. Reviewed importance of setting healthy boundaries with self and others.   Suicidal/Homicidal: No   Therapist Response: Teresa Crawford is continuing to elevate mood and show evidence of usual energy, activities, and socialization level.; Develop the ability to recognize, accept, and cope with feelings of depression 5 out of 7 opportunities to do so.  We will continue to monitor progress.  Plan: Return again in 3 weeks.  Diagnosis: Axis I: Post Traumatic Stress Disorder    Axis II: No diagnosis    Rachel Bo Geniva Lohnes, LCSW 05/01/2020

## 2020-05-07 NOTE — Progress Notes (Signed)
05/08/2020 8:28 PM   Teresa Crawford 04/29/1985 RE:[id number]  Referring provider: Lavera Guise, [address] [place],  [place] [address]  No chief complaint on file.  Urological history 1. Nephrolithiasis - Contrast CT 02/2020 ? 2 mm stone in the distal aspect of the right ureter and a punctate 3 mm nonobstructing left stone  - treated with medical expulsion therapy - KUB 05/23/2019 no stones seen  HPI: Teresa Crawford is a 35 y.o. female who presents today for a follow up.   She has had no further episodes of flank pain.  She had not noticed any passage of any distinct fragments.  Patient denies any modifying or aggravating factors.  Patient denies any gross hematuria, dysuria or suprapubic/flank pain.  Patient denies any fevers, chills, nausea or vomiting.   UA negative for micro heme.     PMH: Past Medical History:  Diagnosis Date  . B12 deficiency anemia   . Chronic back pain   . Chronic fatigue syndrome   . Constipation   . Depression   . Elevated liver enzymes   . Endometriosis   . Fibroids   . Hepatic steatosis   . Hypothyroid   . Last menstrual period (LMP) > 10 days ago 09/12/15  . Mental retardation   . Migraine   . Post traumatic stress disorder   . Posttraumatic stress disorder   . PUD (peptic ulcer disease)   . Scoliosis   . Splenomegaly   . Uterine fibroid     Surgical History: Past Surgical History:  Procedure Laterality Date  . ABDOMINAL HYSTERECTOMY    . BACK SURGERY    . COLONOSCOPY  04/27/2006   Wohl-colitis, internal hemorrhoids  . COLONOSCOPY  02/2013   focal right colitis, left colon biopsy negative, focal active proctitis without chronicity  . ESOPHAGOGASTRODUODENOSCOPY  02/2013   multiple peptic & duodenal ulcers, negative h pylori  . INTRAUTERINE DEVICE INSERTION  2012  . LAPAROSCOPIC ENDOMETRIOSIS FULGURATION  2015   [name]  . OVARIAN CYST REMOVAL      Home Medications:  Allergies as of 05/08/2020      Reactions   Other  Anaphylaxis   Peanut butter, uses Epi pen   Peanut Butter Flavor Swelling   Throat swells Throat swells   Sertraline Other (See Comments)   Panic attacks   Codeine Nausea And Vomiting   nausea   Paroxetine Other (See Comments)   Panic Attack   Paroxetine Hcl Other (See Comments)   Panic attack Other reaction(s): Other (See Comments) Panic attacks   Venlafaxine Other (See Comments)   Panic Attack   Zoloft [sertraline Hcl] Other (See Comments)   Panic attack      Medication List       Accurate as of May 08, 2020 11:59 PM. If you have any questions, ask your nurse or doctor.        STOP taking these medications   guaiFENesin 100 MG/5ML Soln Commonly known as: ROBITUSSIN Stopped by: Tonnya Garbett, PA-C     TAKE these medications   acetaminophen 500 MG tablet Commonly known as: TYLENOL 2 TABS PO BID PRN FOR HEADACHES AND PAINS   albuterol 108 (90 Base) MCG/ACT inhaler Commonly known as: ProAir HFA Inhale 2 puffs into the lungs every 6 (six) hours as needed for wheezing or shortness of breath.   buPROPion 75 MG tablet Commonly known as: WELLBUTRIN TAKE 1 TABLET BY MOUTH DAILY WITH BREAKFAST   cetirizine 10 MG tablet Commonly known  as: ZYRTEC TAKE 1 TABLET BY MOUTH AT BEDTIME FOR ALLERGIES   chlorzoxazone 500 MG tablet Commonly known as: Parafon Forte DSC Take one tab po bid for back spasm   diazepam 5 MG tablet Commonly known as: VALIUM INSERT 1 TABLET VAGINALLY ONCE DAILY AT BEDTIME   docusate sodium 100 MG capsule Commonly known as: DOK TAKE 1 CAPSULE BY MOUTH TWICE DAILY FOR STOOL SOFTENER   EPINEPHrine 0.3 mg/0.3 mL Soaj injection Commonly known as: EPI-PEN USE FOR ANAPHYLAXIS   Flovent HFA 220 MCG/ACT inhaler Generic drug: fluticasone INHALE 2 PUFFS INTO THE LUNGS, 2 TIMES PER DAY. RINSE MOUTH AFTER INHALATIONS   fluticasone 50 MCG/ACT nasal spray Commonly known as: FLONASE USE 1 SPRAY INTO EACH NOSTRIL ONCE DAILYFOR CONGESTION OR  ALLERGIES   ibuprofen 600 MG tablet Commonly known as: ADVIL ONE TAB PO BID PRN FOR PAIN AND HEADACHES   omeprazole 40 MG capsule Commonly known as: PRILOSEC Take 1 capsule (40 mg total) by mouth 2 (two) times daily.   oxyCODONE-acetaminophen 5-325 MG tablet Commonly known as: Percocet Take 1 tablet by mouth every 4 (four) hours as needed for severe pain.   topiramate 50 MG tablet Commonly known as: TOPAMAX TAKE 1 TABLET BY MOUTH EVERY EVENING   traMADol 50 MG tablet Commonly known as: ULTRAM ONE TAB PO BID PRN FOR PAIN AND HEADCAHES   traZODone 50 MG tablet Commonly known as: DESYREL Take 0.5 tablets (25 mg total) by mouth at bedtime.       Allergies:  Allergies  Allergen Reactions  . Other Anaphylaxis    Peanut butter, uses Epi pen  . Peanut Butter Flavor Swelling    Throat swells Throat swells  . Sertraline Other (See Comments)    Panic attacks  . Codeine Nausea And Vomiting    nausea  . Paroxetine Other (See Comments)    Panic Attack   . Paroxetine Hcl Other (See Comments)    Panic attack Other reaction(s): Other (See Comments) Panic attacks  . Venlafaxine Other (See Comments)    Panic Attack   . Zoloft [Sertraline Hcl] Other (See Comments)    Panic attack    Family History: Family History  Problem Relation Age of Onset  . Breast cancer Mother 9  . COPD Father   . Lung cancer Father 59  . Dementia Father   . Diabetes Sister   . Diabetes Brother   . Ovarian cancer Paternal Aunt   . Colon cancer Maternal Grandfather 41  . Liver disease Maternal Aunt        ?etiology  . Lymphoma Maternal Grandmother 60  . Lung cancer Other 64       maternal great grandfather  . Stomach cancer Other 47       maternal great grandmother    Social History:  reports that she has never smoked. She has never used smokeless tobacco. She reports previous alcohol use. She reports that she does not use drugs.   Physical Exam: BP 121/85   Pulse 73   Ht 5\' 3"  (1.6  m)   Wt 206 lb (93.4 kg)   LMP 01/05/2016   BMI 36.49 kg/m   Constitutional:  Well nourished. Alert and oriented, No acute distress. HEENT: [place] AT, mask in place  Trachea midline Cardiovascular: No clubbing, cyanosis, or edema. Respiratory: Normal respiratory effort, no increased work of breathing. Neurologic: Grossly intact, no focal deficits, moving all 4 extremities. Psychiatric: Normal mood and affect.   Laboratory data: Urinalysis Component  Latest Ref Rng & Units 05/08/2020  Specific Gravity, UA     1.005 - 1.030 1.025  pH, UA     5.0 - 7.5 6.0  Color, UA     Yellow Yellow  Appearance Ur     Clear Cloudy (A)  Leukocytes,UA     Negative 1+ (A)  Protein,UA     Negative/Trace Negative  Glucose, UA     Negative Negative  Ketones, UA     Negative Negative  RBC, UA     Negative Negative  Bilirubin, UA     Negative Negative  Urobilinogen, Ur     0.2 - 1.0 mg/dL 0.2  Nitrite, UA     Negative Negative  Microscopic Examination      See below:   Component     Latest Ref Rng & Units 05/08/2020  WBC, UA     0 - 5 /hpf 6-10 (A)  RBC     0 - 2 /hpf 0-2  Epithelial Cells (non renal)     0 - 10 /hpf 0-10  Bacteria, UA     None seen/Few Moderate (A)   I have reviewed the labs.  Pertinent Imaging: CLINICAL DATA:  Left-sided flank pain  EXAM: ABDOMEN - 1 VIEW  COMPARISON:  02/27/2013  FINDINGS: Scattered large and small bowel gas is noted. Mild retained fecal material is noted. Postsurgical changes in the thoracolumbar spine are seen. No renal or ureteral stones are identified. No acute bony abnormality is seen.  IMPRESSION: No evidence of renal or ureteral calculi.   Electronically Signed   By: Inez Catalina M.D.   On: 03/21/2020 17:19  Assessment & Plan:    1. Nephrolithiasis Stone not visualized on previous KUB Asymptomatic UA negative for micro heme Patient will follow up on a as needed basis   Zara Council, PA-C  [facility] [address], [place], [place] [address] [phone]

## 2020-05-08 ENCOUNTER — Encounter: Payer: Self-pay | Admitting: Urology

## 2020-05-08 ENCOUNTER — Ambulatory Visit (INDEPENDENT_AMBULATORY_CARE_PROVIDER_SITE_OTHER): Payer: Medicare Other | Admitting: Urology

## 2020-05-08 ENCOUNTER — Other Ambulatory Visit: Payer: Self-pay

## 2020-05-08 VITALS — BP 121/85 | HR 73 | Ht 63.0 in | Wt 206.0 lb

## 2020-05-08 DIAGNOSIS — N200 Calculus of kidney: Secondary | ICD-10-CM | POA: Diagnosis not present

## 2020-05-09 LAB — MICROSCOPIC EXAMINATION

## 2020-05-09 LAB — URINALYSIS, COMPLETE
Bilirubin, UA: NEGATIVE
Glucose, UA: NEGATIVE
Ketones, UA: NEGATIVE
Nitrite, UA: NEGATIVE
Protein,UA: NEGATIVE
RBC, UA: NEGATIVE
Specific Gravity, UA: 1.025 (ref 1.005–1.030)
Urobilinogen, Ur: 0.2 mg/dL (ref 0.2–1.0)
pH, UA: 6 (ref 5.0–7.5)

## 2020-05-14 ENCOUNTER — Ambulatory Visit: Payer: Medicare Other | Admitting: Internal Medicine

## 2020-05-15 ENCOUNTER — Ambulatory Visit: Payer: Medicare Other | Admitting: Licensed Clinical Social Worker

## 2020-05-21 ENCOUNTER — Other Ambulatory Visit: Payer: Self-pay

## 2020-05-21 ENCOUNTER — Ambulatory Visit (INDEPENDENT_AMBULATORY_CARE_PROVIDER_SITE_OTHER): Payer: Medicare Other | Admitting: Hospice and Palliative Medicine

## 2020-05-21 ENCOUNTER — Encounter: Payer: Self-pay | Admitting: Internal Medicine

## 2020-05-21 VITALS — BP 113/75 | HR 98 | Temp 98.6°F | Resp 16 | Ht 63.0 in | Wt 210.0 lb

## 2020-05-21 DIAGNOSIS — F331 Major depressive disorder, recurrent, moderate: Secondary | ICD-10-CM | POA: Diagnosis not present

## 2020-05-21 DIAGNOSIS — Z6837 Body mass index (BMI) 37.0-37.9, adult: Secondary | ICD-10-CM | POA: Diagnosis not present

## 2020-05-21 DIAGNOSIS — E6609 Other obesity due to excess calories: Secondary | ICD-10-CM | POA: Diagnosis not present

## 2020-05-21 DIAGNOSIS — Z87892 Personal history of anaphylaxis: Secondary | ICD-10-CM | POA: Diagnosis not present

## 2020-05-21 DIAGNOSIS — E66812 Obesity, class 2: Secondary | ICD-10-CM

## 2020-05-21 DIAGNOSIS — F4310 Post-traumatic stress disorder, unspecified: Secondary | ICD-10-CM | POA: Diagnosis not present

## 2020-05-21 MED ORDER — EPINEPHRINE 0.3 MG/0.3ML IJ SOAJ: INTRAMUSCULAR | 5 refills | Status: DC

## 2020-05-21 NOTE — Progress Notes (Signed)
[facility] [place], [place] [address]  Internal MEDICINE  Office Visit Note  Patient Name: Teresa Crawford  [id number]  [id number]  Date of Service: 05/25/2020  Chief Complaint  Patient presents with  . Follow-up  . Depression  . Quality Metric Gaps    Covid booster    HPI Patient is here for routine follow-up Accompanied by her caregiver today Suffered from kidney stone in November was able to pass stone without procedure or complication, had recent follow-up with urology and repeat imaging revealed no evidence of stone Followed by psychiatry for MDD, PTSD and insomnia--stable on current treatment plan in conjunction with CBT She is feeling well, enjoying her work at [employer], hours have been recently cut down to 2 days per week  Requesting a few refills today  Current Medication: Outpatient Encounter Medications as of 05/21/2020  Medication Sig  . acetaminophen (TYLENOL) 500 MG tablet 2 TABS PO BID PRN FOR HEADACHES AND PAINS  . albuterol (PROAIR HFA) 108 (90 Base) MCG/ACT inhaler Inhale 2 puffs into the lungs every 6 (six) hours as needed for wheezing or shortness of breath.  Marland Kitchen buPROPion (WELLBUTRIN) 75 MG tablet TAKE 1 TABLET BY MOUTH DAILY WITH BREAKFAST  . cetirizine (ZYRTEC) 10 MG tablet TAKE 1 TABLET BY MOUTH AT BEDTIME FOR ALLERGIES  . chlorzoxazone (PARAFON FORTE DSC) 500 MG tablet Take one tab po bid for back spasm  . diazepam (VALIUM) 5 MG tablet Insert 1 tablet intravaginally once daily at bedtime.  . docusate sodium (DOK) 100 MG capsule TAKE 1 CAPSULE BY MOUTH TWICE DAILY FOR STOOL SOFTENER  . EPINEPHrine 0.3 mg/0.3 mL IJ SOAJ injection USE FOR ANAPHYLAXIS  . fluticasone (FLONASE) 50 MCG/ACT nasal spray USE 1 SPRAY INTO EACH NOSTRIL ONCE DAILYFOR CONGESTION OR ALLERGIES  . fluticasone (FLOVENT HFA) 220 MCG/ACT inhaler INHALE 2 PUFFS INTO THE LUNGS, 2 TIMES PER DAY. RINSE MOUTH AFTER INHALATIONS  . ibuprofen (ADVIL) 600 MG tablet ONE TAB  PO BID PRN FOR PAIN AND HEADACHES  . omeprazole (PRILOSEC) 40 MG capsule Take 1 capsule (40 mg total) by mouth 2 (two) times daily.  Marland Kitchen topiramate (TOPAMAX) 50 MG tablet TAKE 1 TABLET BY MOUTH EVERY EVENING  . traMADol (ULTRAM) 50 MG tablet ONE TAB PO BID PRN FOR PAIN AND HEADCAHES  . traZODone (DESYREL) 50 MG tablet Take 0.5 tablets (25 mg total) by mouth at bedtime.  . [DISCONTINUED] diazepam (VALIUM) 5 MG tablet INSERT 1 TABLET VAGINALLY ONCE DAILY AT BEDTIME  . [DISCONTINUED] EPINEPHrine 0.3 mg/0.3 mL IJ SOAJ injection USE FOR ANAPHYLAXIS  . [DISCONTINUED] oxyCODONE-acetaminophen (PERCOCET) 5-325 MG tablet Take 1 tablet by mouth every 4 (four) hours as needed for severe pain.   No facility-administered encounter medications on file as of 05/21/2020.    Surgical History: Past Surgical History:  Procedure Laterality Date  . ABDOMINAL HYSTERECTOMY    . BACK SURGERY    . COLONOSCOPY  04/27/2006   Wohl-colitis, internal hemorrhoids  . COLONOSCOPY  02/2013   focal right colitis, left colon biopsy negative, focal active proctitis without chronicity  . ESOPHAGOGASTRODUODENOSCOPY  02/2013   multiple peptic & duodenal ulcers, negative h pylori  . INTRAUTERINE DEVICE INSERTION  2012  . LAPAROSCOPIC ENDOMETRIOSIS FULGURATION  2015   [name]  . OVARIAN CYST REMOVAL      Medical History: Past Medical History:  Diagnosis Date  . B12 deficiency anemia   . Chronic back pain   . Chronic fatigue syndrome   . Constipation   .  Depression   . Elevated liver enzymes   . Endometriosis   . Fibroids   . Hepatic steatosis   . Hypothyroid   . Last menstrual period (LMP) > 10 days ago 09/12/15  . Mental retardation   . Migraine   . Post traumatic stress disorder   . Posttraumatic stress disorder   . PUD (peptic ulcer disease)   . Scoliosis   . Splenomegaly   . Uterine fibroid     Family History: Family History  Problem Relation Age of Onset  . Breast cancer Mother 46  . COPD Father   . Lung  cancer Father 84  . Dementia Father   . Diabetes Sister   . Diabetes Brother   . Ovarian cancer Paternal Aunt   . Colon cancer Maternal Grandfather 21  . Liver disease Maternal Aunt        ?etiology  . Lymphoma Maternal Grandmother 60  . Lung cancer Other 79       maternal great grandfather  . Stomach cancer Other 56       maternal great grandmother    Social History   Socioeconomic History  . Marital status: Single    Spouse name: Not on file  . Number of children: 0  . Years of education: Not on file  . Highest education level: Not on file  Occupational History  . Not on file  Tobacco Use  . Smoking status: Never Smoker  . Smokeless tobacco: Never Used  Vaping Use  . Vaping Use: Never used  Substance and Sexual Activity  . Alcohol use: Not Currently    Alcohol/week: 0.0 standard drinks  . Drug use: No  . Sexual activity: Not Currently  Other Topics Concern  . Not on file  Social History Narrative   She has brother, half brother & sister live nearby   Both parents deceased   Care managed by [name]   Social Determinants of Health   Financial Resource Strain: Not on file  Food Insecurity: Not on file  Transportation Needs: Not on file  Physical Activity: Not on file  Stress: Not on file  Social Connections: Not on file  Intimate Partner Violence: Not on file    Review of Systems  Constitutional: Negative for chills, diaphoresis and fatigue.  HENT: Negative for ear pain, postnasal drip and sinus pressure.   Eyes: Negative for photophobia, discharge, redness, itching and visual disturbance.  Respiratory: Negative for cough, shortness of breath and wheezing.   Cardiovascular: Negative for chest pain, palpitations and leg swelling.  Gastrointestinal: Negative for abdominal pain, constipation, diarrhea, nausea and vomiting.  Genitourinary: Negative for dysuria and flank pain.  Musculoskeletal: Negative for arthralgias, back pain, gait problem and neck pain.   Skin: Negative for color change.  Allergic/Immunologic: Negative for environmental allergies and food allergies.  Neurological: Negative for dizziness and headaches.  Hematological: Does not bruise/bleed easily.  Psychiatric/Behavioral: Positive for behavioral problems (depression) and sleep disturbance. Negative for agitation and hallucinations. The patient is nervous/anxious.     Vital Signs: BP 113/75   Pulse 98   Temp 98.6 F (37 C)   Resp 16   Ht 5\' 3"  (1.6 m)   Wt 210 lb (95.3 kg)   LMP 01/05/2016   SpO2 98%   BMI 37.20 kg/m    Physical Exam Vitals reviewed.  Constitutional:      Appearance: Normal appearance. She is obese.  Cardiovascular:     Rate and Rhythm: Normal rate and regular rhythm.  Pulses: Normal pulses.     Heart sounds: Normal heart sounds.  Pulmonary:     Effort: Pulmonary effort is normal.     Breath sounds: Normal breath sounds.  Abdominal:     General: Abdomen is flat.     Palpations: Abdomen is soft.  Musculoskeletal:        General: Normal range of motion.     Cervical back: Normal range of motion.  Skin:    General: Skin is warm.  Neurological:     General: No focal deficit present.     Mental Status: She is alert and oriented to person, place, and time. Mental status is at baseline.  Psychiatric:        Mood and Affect: Mood normal.        Behavior: Behavior normal.        Thought Content: Thought content normal.        Judgment: Judgment normal.    Assessment/Plan: 1. Personal history of anaphylaxis NO recent reactions, avoids known triggers, requesting refills of Epipen - EPINEPHrine 0.3 mg/0.3 mL IJ SOAJ injection; USE FOR ANAPHYLAXIS  Dispense: 1 each; Refill: 5  2. PTSD (post-traumatic stress disorder) Stable, requesting refills  Controlled Substance Database was reviewed by me for overdose risk score (ORS) - diazepam (VALIUM) 5 MG tablet; Insert 1 tablet intravaginally once daily at bedtime.  Dispense: 30 tablet;  Refill: 2  3. Major depressive disorder, recurrent episode, moderate (HCC) Stable, followed by pschiatry  4. Class 2 obesity due to excess calories without serious comorbidity with body mass index (BMI) of 37.0 to 37.9 in adult Discussed increasing her water intake, foods to eat in moderation and increasing physical activity levels Obesity Counseling: Risk Assessment: An assessment of behavioral risk factors was made today and includes lack of exercise sedentary lifestyle, lack of portion control and poor dietary habits.  Risk Modification Advice: She was counseled on portion control guidelines. Restricting daily caloric intake to 1800. The detrimental long term effects of obesity on her health and ongoing poor compliance was also discussed with the patient.  General Counseling: elsy chiang understanding of the findings of todays visit and agrees with plan of treatment. I have discussed any further diagnostic evaluation that may be needed or ordered today. We also reviewed her medications today. she has been encouraged to call the office with any questions or concerns that should arise related to todays visit.   Meds ordered this encounter  Medications  . EPINEPHrine 0.3 mg/0.3 mL IJ SOAJ injection    Sig: USE FOR ANAPHYLAXIS    Dispense:  1 each    Refill:  5    WILL NEED REFILLS AUTHORIZED IF PATIENT IS TO [place] THIS IN AN EMERGENCY.  THANKS  . diazepam (VALIUM) 5 MG tablet    Sig: Insert 1 tablet intravaginally once daily at bedtime.    Dispense:  30 tablet    Refill:  2    Time spent: 30 Minutes Time spent includes review of chart, medications, test results and follow-up plan with the patient.  This patient was seen by Theodoro Grist AGNP-C in Collaboration with Dr Lavera Guise as a part of collaborative care agreement     Tanna Furry. Sole Lengacher AGNP-C Internal medicine

## 2020-05-22 MED ORDER — DIAZEPAM 5 MG PO TABS: ORAL_TABLET | ORAL | 2 refills | Status: DC

## 2020-05-25 ENCOUNTER — Encounter: Payer: Self-pay | Admitting: Hospice and Palliative Medicine

## 2020-06-04 ENCOUNTER — Ambulatory Visit (INDEPENDENT_AMBULATORY_CARE_PROVIDER_SITE_OTHER): Payer: Medicare Other | Admitting: Licensed Clinical Social Worker

## 2020-06-04 ENCOUNTER — Other Ambulatory Visit: Payer: Self-pay

## 2020-06-04 DIAGNOSIS — F4310 Post-traumatic stress disorder, unspecified: Secondary | ICD-10-CM

## 2020-06-04 DIAGNOSIS — F3342 Major depressive disorder, recurrent, in full remission: Secondary | ICD-10-CM | POA: Diagnosis not present

## 2020-06-04 NOTE — Progress Notes (Signed)
Virtual Visit via Video Note  I connected with Teresa Crawford on 06/04/20 at 10:00 AM EST by a video enabled telemedicine application and verified that I am speaking with the correct person using two identifiers.  Location: Patient: home Provider: [facility]   I discussed the limitations of evaluation and management by telemedicine and the availability of in person appointments. The patient expressed understanding and agreed to proceed.  I discussed the assessment and treatment plan with the patient. The patient was provided an opportunity to ask questions and all were answered. The patient agreed with the plan and demonstrated an understanding of the instructions.   The patient was advised to call back or seek an in-person evaluation if the symptoms worsen or if the condition fails to improve as anticipated.  I provided 60 minutes of non-face-to-face time during this encounter.   Teresa Crawford R Teresa Shen, LCSW    THERAPIST PROGRESS NOTE  Session Time: 10-11a  Participation Level: Active  Behavioral Response: Neat and Well GroomedAlertAnxious  Type of Therapy: Individual Therapy  Treatment Goals addressed: Anxiety and Coping  Interventions: CBT, DBT and Social Skills Training  Summary: Teresa Crawford is a 35 y.o. female who presents with continuing symptoms related to anxiety and PTSD. Pt reports that mood has been fluctuating depending on external stressors and that she is using coping skills to manage stress and anxiety symptoms.  Allowed pt to explore and express thoughts and feelings about current stressors--pt is upset about a recent breakup. Pt broke up with current boyfriend/fiance because she still has feelings from her ex-best friend and feels that she could never love anyone else like she loved her best friend (Teresa Crawford). Pt states that she feels Teresa Crawford is trying to communicate with her through her dreams--telling her that he is unhappy and that he wants them to be together. Teresa Crawford  has not had any contact with Teresa Crawford in over one year and he has a girlfriend currently. Pts sister is worried that Teresa Crawford is "caught up" in her feelings about this person and is giving up on "real life" to chase a dream. Confronted pt about this, and Teresa Crawford still states that she understands what everyone says, but that she is certain that her and Teresa Crawford are destined to be together.    Pt states that she feels that she is not sleeping well and has decreased appetite since the breakup. 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  Reviewed mindfulness and meditation skills to help pt wind down when she feels like her thoughts are racing. Pt reflected and demonstrated proper technique and reports that she Teresa Crawford try to incorporate more into her bedtime routine.  Continued recommendations are as follows: self care behaviors, positive social engagements, focusing on overall work/home/life balance, and focusing on positive physical and emotional wellness.   Suicidal/Homicidal: No  Therapist Response: Teresa Crawford is continuing to have mild preoccupation with ex friend. This preoccupation has triggered Teresa Crawford to break up with current boyfriend and is impacting her overall quality of sleep and eating habits. Teresa Crawford is reporting more anxiety and racing thoughts, which is indicative of fluctuating/intermittent progress. Treatment to continue and Teresa Crawford mention to psychiatrist.   Plan: Return again in 8 weeks.  Diagnosis: Axis I: Major Depression, Recurrent, remission and Post Traumatic Stress Disorder    Axis II: No diagnosis  [place], LCSW 06/04/2020

## 2020-06-18 ENCOUNTER — Other Ambulatory Visit: Payer: Self-pay | Admitting: Psychiatry

## 2020-06-18 DIAGNOSIS — F331 Major depressive disorder, recurrent, moderate: Secondary | ICD-10-CM

## 2020-06-19 ENCOUNTER — Encounter: Payer: Self-pay | Admitting: Psychiatry

## 2020-06-19 ENCOUNTER — Other Ambulatory Visit: Payer: Self-pay

## 2020-06-19 ENCOUNTER — Telehealth (INDEPENDENT_AMBULATORY_CARE_PROVIDER_SITE_OTHER): Payer: Medicare Other | Admitting: Psychiatry

## 2020-06-19 ENCOUNTER — Telehealth: Payer: Medicare Other | Admitting: Psychiatry

## 2020-06-19 DIAGNOSIS — F5105 Insomnia due to other mental disorder: Secondary | ICD-10-CM | POA: Diagnosis not present

## 2020-06-19 DIAGNOSIS — Z634 Disappearance and death of family member: Secondary | ICD-10-CM

## 2020-06-19 DIAGNOSIS — F4310 Post-traumatic stress disorder, unspecified: Secondary | ICD-10-CM | POA: Diagnosis not present

## 2020-06-19 DIAGNOSIS — F3342 Major depressive disorder, recurrent, in full remission: Secondary | ICD-10-CM

## 2020-06-19 MED ORDER — TRAZODONE HCL 50 MG PO TABS: 25.0000 mg | ORAL_TABLET | Freq: Every day | ORAL | 0 refills | Status: DC

## 2020-06-19 NOTE — Progress Notes (Signed)
Virtual Visit via Video Note  I connected with Teresa Crawford on 06/19/20 at  1:00 PM EDT by a video enabled telemedicine application and verified that I am speaking with the correct person using two identifiers.  Location Provider Location : [facility] Patient Location : Home  Participants: Patient , Provider   I discussed the limitations of evaluation and management by telemedicine and the availability of in person appointments. The patient expressed understanding and agreed to proceed.    I discussed the assessment and treatment plan with the patient. The patient was provided an opportunity to ask questions and all were answered. The patient agreed with the plan and demonstrated an understanding of the instructions.   The patient was advised to call back or seek an in-person evaluation if the symptoms worsen or if the condition fails to improve as anticipated.   [place] MD OP Progress Note  06/19/2020 1:37 PM Teresa Crawford  MRN:  [id number]  Chief Complaint:  Chief Complaint    Follow-up; Anxiety     HPI: Teresa Crawford is a 35 year old Caucasian female who is single, lives in [place], has a history of MDD, PTSD, insomnia, bereavement was evaluated by telemedicine today.  Patient today reports she is currently struggling with intrusive memories about her past trauma.  She also had racing thoughts at night and was unable to shut her mind down.  This did have an impact on her sleep.  She however reports she was able to talk to her therapist Ms. Christina Hussami who helped her to work on Marine scientist.  She reports she is trying to practice those techniques and that has helped her to wind down and go to sleep.  Her sleep has improved.  Her racing thoughts have improved since then.  Patient denies any significant sadness.  She denies suicidality or homicidality.  She denies perceptual disturbances.  Patient is compliant on medications.  Denies side effects.  Patient denies  any other concerns today. Visit Diagnosis:    ICD-10-CM   1. MDD (major depressive disorder), recurrent, in full remission ([place])  F33.42   2. Posttraumatic stress disorder  F43.10   3. Insomnia due to mental condition  F51.05 traZODone (DESYREL) 50 MG tablet  4. Bereavement  Z63.4     Past Psychiatric History: I have reviewed past psychiatric history from my progress note on 08/16/2018  Past Medical History:  Past Medical History:  Diagnosis Date  . B12 deficiency anemia   . Chronic back pain   . Chronic fatigue syndrome   . Constipation   . Depression   . Elevated liver enzymes   . Endometriosis   . Fibroids   . Hepatic steatosis   . Hypothyroid   . Last menstrual period (LMP) > 10 days ago 09/12/15  . Mental retardation   . Migraine   . Post traumatic stress disorder   . Posttraumatic stress disorder   . PUD (peptic ulcer disease)   . Scoliosis   . Splenomegaly   . Uterine fibroid     Past Surgical History:  Procedure Laterality Date  . ABDOMINAL HYSTERECTOMY    . BACK SURGERY    . COLONOSCOPY  04/27/2006   Wohl-colitis, internal hemorrhoids  . COLONOSCOPY  02/2013   focal right colitis, left colon biopsy negative, focal active proctitis without chronicity  . ESOPHAGOGASTRODUODENOSCOPY  02/2013   multiple peptic & duodenal ulcers, negative h pylori  . INTRAUTERINE DEVICE INSERTION  2012  . LAPAROSCOPIC ENDOMETRIOSIS FULGURATION  2015  [name]  . OVARIAN CYST REMOVAL      Family Psychiatric History: I have reviewed family psychiatric history from my progress note on 08/16/2018  Family History:  Family History  Problem Relation Age of Onset  . Breast cancer Mother 65  . COPD Father   . Lung cancer Father 1  . Dementia Father   . Diabetes Sister   . Diabetes Brother   . Ovarian cancer Paternal Aunt   . Colon cancer Maternal Grandfather 58  . Liver disease Maternal Aunt        ?etiology  . Lymphoma Maternal Grandmother 60  . Lung cancer Other 65        maternal great grandfather  . Stomach cancer Other 4       maternal great grandmother    Social History: I have reviewed social history from my progress note on 08/16/2018 Social History   Socioeconomic History  . Marital status: Single    Spouse name: Not on file  . Number of children: 0  . Years of education: Not on file  . Highest education level: Not on file  Occupational History  . Not on file  Tobacco Use  . Smoking status: Never Smoker  . Smokeless tobacco: Never Used  Vaping Use  . Vaping Use: Never used  Substance and Sexual Activity  . Alcohol use: Not Currently    Alcohol/week: 0.0 standard drinks  . Drug use: No  . Sexual activity: Not Currently  Other Topics Concern  . Not on file  Social History Narrative   She has brother, half brother & sister live nearby   Both parents deceased   Care managed by [name]   Social Determinants of Health   Financial Resource Strain: Not on file  Food Insecurity: Not on file  Transportation Needs: Not on file  Physical Activity: Not on file  Stress: Not on file  Social Connections: Not on file    Allergies:  Allergies  Allergen Reactions  . Other Anaphylaxis    Peanut butter, uses Epi pen  . Peanut Butter Flavor Swelling    Throat swells Throat swells  . Sertraline Other (See Comments)    Panic attacks  . Codeine Nausea And Vomiting    nausea  . Paroxetine Other (See Comments)    Panic Attack   . Paroxetine Hcl Other (See Comments)    Panic attack Other reaction(s): Other (See Comments) Panic attacks  . Venlafaxine Other (See Comments)    Panic Attack   . Zoloft [Sertraline Hcl] Other (See Comments)    Panic attack    Metabolic Disorder Labs: Lab Results  Component Value Date   HGBA1C 5.5 12/20/2019   No results found for: PROLACTIN Lab Results  Component Value Date   CHOL 169 12/20/2019   TRIG 117 12/20/2019   HDL 34 (L) 12/20/2019   LDLCALC 114 (H) 12/20/2019   LDLCALC 100 (H)  12/09/2018   Lab Results  Component Value Date   TSH 3.820 12/20/2019   TSH 2.740 12/09/2018    Therapeutic Level Labs: No results found for: LITHIUM No results found for: VALPROATE No components found for:  CBMZ  Current Medications: Current Outpatient Medications  Medication Sig Dispense Refill  . acetaminophen (TYLENOL) 500 MG tablet 2 TABS PO BID PRN FOR HEADACHES AND PAINS 45 tablet 3  . albuterol (PROAIR HFA) 108 (90 Base) MCG/ACT inhaler Inhale 2 puffs into the lungs every 6 (six) hours as needed for wheezing or shortness of breath.  1 Inhaler 3  . buPROPion (WELLBUTRIN) 75 MG tablet TAKE 1 TABLET BY MOUTH DAILY WITH BREAKFAST 90 tablet 0  . cetirizine (ZYRTEC) 10 MG tablet TAKE 1 TABLET BY MOUTH AT BEDTIME FOR ALLERGIES 30 tablet 3  . diazepam (VALIUM) 5 MG tablet Insert 1 tablet intravaginally once daily at bedtime. 30 tablet 2  . docusate sodium (DOK) 100 MG capsule TAKE 1 CAPSULE BY MOUTH TWICE DAILY FOR STOOL SOFTENER 60 capsule 3  . EPINEPHrine 0.3 mg/0.3 mL IJ SOAJ injection USE FOR ANAPHYLAXIS 1 each 5  . fluticasone (FLONASE) 50 MCG/ACT nasal spray USE 1 SPRAY INTO EACH NOSTRIL ONCE DAILYFOR CONGESTION OR ALLERGIES 48 g 3  . fluticasone (FLOVENT HFA) 220 MCG/ACT inhaler INHALE 2 PUFFS INTO THE LUNGS, 2 TIMES PER DAY. RINSE MOUTH AFTER INHALATIONS 12 g 11  . ibuprofen (ADVIL) 600 MG tablet ONE TAB PO BID PRN FOR PAIN AND HEADACHES 30 tablet 3  . omeprazole (PRILOSEC) 40 MG capsule Take 1 capsule (40 mg total) by mouth 2 (two) times daily. 60 capsule 6  . topiramate (TOPAMAX) 50 MG tablet TAKE 1 TABLET BY MOUTH EVERY EVENING 30 tablet 3  . traMADol (ULTRAM) 50 MG tablet ONE TAB PO BID PRN FOR PAIN AND HEADCAHES 20 tablet 3  . traZODone (DESYREL) 50 MG tablet Take 0.5 tablets (25 mg total) by mouth at bedtime. 45 tablet 0   No current facility-administered medications for this visit.     Musculoskeletal: Strength & Muscle Tone: UTA Gait & Station: normal Patient  leans: N/A  Psychiatric Specialty Exam: Review of Systems  Psychiatric/Behavioral: Positive for sleep disturbance. The patient is nervous/anxious.   All other systems reviewed and are negative.   Last menstrual period 01/05/2016.There is no height or weight on file to calculate BMI.  General Appearance: Casual  Eye Contact:  Fair  Speech:  Normal Rate  Volume:  Normal  Mood:  Anxious  Affect:  Congruent  Thought Process:  Goal Directed and Descriptions of Associations: Intact  Orientation:  Full (Time, Place, and Person)  Thought Content: Logical   Suicidal Thoughts:  No  Homicidal Thoughts:  No  Memory:  Immediate;   Fair Recent;   Fair Remote;   Fair  Judgement:  Fair  Insight:  Fair  Psychomotor Activity:  Normal  Concentration:  Concentration: Fair and Attention Span: Fair  Recall:  [employer] of Knowledge: Fair  Language: Fair  Akathisia:  No  Handed:  Right  AIMS (if indicated): UTA  Assets:  Communication Skills Desire for Improvement Housing Social Support  ADL's:  Intact  Cognition: WNL  Sleep:  improving   Screenings: Mini-Mental   Flowsheet Row Clinical Support from 12/19/2019 in [facility] Office Visit from 10/13/2017 in [facility], [facility]  Total Score (max 30 points ) 30 27    PHQ2-9   Flowsheet Row Video Visit from 06/19/2020 in [facility] [place] Office Visit from 05/21/2020 in [facility] [place] from 12/19/2019 in [facility] Video Visit from 12/18/2019 in [facility] from 11/07/2019 in [facility]  PHQ-2 Total Score 0 0 0 6 4  PHQ-9 Total Score -- -- -- 16 9    Flowsheet Row Video Visit from 06/19/2020 in [facility] from 06/04/2020 in [facility] No Risk No Risk       Assessment and Plan: BERTRICE LEDER is  a 35 year old Caucasian female, single, currently works part-time on [employer], has a history of MDD, PTSD, insomnia was evaluated by telemedicine today.  Patient is currently making progress and will continue to benefit from psychotherapy sessions.  Plan as noted below.  Plan MDD in remission Wellbutrin 75 mg p.o. daily Trazodone 25 mg p.o. nightly-reduced dosage Continue CBT with Ms. Christina Hussami  PTSD-unstable Continue CBT as needed We will consider adding medication like an SSRI in the future as needed.  We will also consider adding medication for nightmares as needed.  Insomnia-improving Trazodone 25 mg p.o. nightly-reduced dosage  Bereavement-stable Continue CBT  We will coordinate care with her therapist.  Follow-up in clinic in 1 month or sooner if needed.  This note was generated in part or whole with voice recognition software. Voice recognition is usually quite accurate but there are transcription errors that can and very often do occur. I apologize for any typographical errors that were not detected and corrected.          Ursula Alert, MD 06/20/2020, 10:01 AM

## 2020-07-02 ENCOUNTER — Encounter: Payer: Self-pay | Admitting: Hospice and Palliative Medicine

## 2020-07-02 ENCOUNTER — Ambulatory Visit: Payer: Medicare Other | Admitting: Licensed Clinical Social Worker

## 2020-07-02 ENCOUNTER — Ambulatory Visit (INDEPENDENT_AMBULATORY_CARE_PROVIDER_SITE_OTHER): Payer: Medicare Other | Admitting: Physician Assistant

## 2020-07-02 DIAGNOSIS — R197 Diarrhea, unspecified: Secondary | ICD-10-CM | POA: Diagnosis not present

## 2020-07-02 DIAGNOSIS — R109 Unspecified abdominal pain: Secondary | ICD-10-CM

## 2020-07-02 DIAGNOSIS — R112 Nausea with vomiting, unspecified: Secondary | ICD-10-CM | POA: Diagnosis not present

## 2020-07-02 MED ORDER — PROMETHAZINE HCL 12.5 MG PO TABS: 12.5000 mg | ORAL_TABLET | Freq: Three times a day (TID) | ORAL | 0 refills | Status: DC | PRN

## 2020-07-02 NOTE — Patient Instructions (Signed)
Clear Liquid Diet, Adult A clear liquid diet is a diet that includes only liquids and semi-liquids that you can see through. No solid food is eaten on this diet. Most people need to follow this diet for only a short time. You may need to follow a clear liquid diet if:  You have a problem right before or after you have surgery.  You did not eat food for a long time.  You had any of these: ? Vomiting or feeling like you may vomit (nausea). ? Watery poop (diarrhea).  You are going to have an exam to look at parts of your digestive system.  You are going to have bowel surgery. What are the goals of this diet?  To rest the stomach.  To help you clear the digestive system before an exam.  To make sure that there is enough fluid in your body.  To make sure you get some energy.  To help you get back to eating like you used to. What are tips for following this plan?  A clear liquid is a liquid or semi-liquid that you can see through when you hold it up to a light. An example of a semi-liquid is gelatin.  This diet does not give you all the nutrients that you need. Choose a variety of the liquids that your doctor says you can have on this diet. That way, you will get as many nutrients as possible.  If you are not sure whether you can have certain items, ask your doctor.  If you cannot swallow a thin liquid, you will need to thicken it before taking it. This will stop you from breathing it in (aspirating). What foods should I eat?  Water and flavored water.  Fruit juices that do not have pulp. This includes cranberry juice, apple juice, or grape juice.  Tea and coffee without milk or cream.  Clear bouillon or broth.  Broth-based soups that have been strained.  Flavored gelatin.  Honey.  Sugar water.  Ice or frozen ice pops that do not have any milk, yogurt, fruit pieces, or fruit pulp in them.  Clear sodas.  Clear sports drinks. The items listed above may not be a  complete list of what you can eat and drink. Contact a dietitian for more options.   What foods should I avoid?  Juices that have pulp.  Milk.  Cream or cream-based soups.  Yogurt.  Solid foods that are not clear liquids or semi-liquids. The items listed above may not be a complete list of what you should not eat and drink. Contact a dietitian for more information. Questions to ask your doctor:  How long do I need to follow this diet?  Are there any medicines that I should change while on this diet? Summary  A clear liquid diet is a diet that includes only liquids and semi-liquids that you can see through.  Some goals of this diet are to rest your stomach and make sure you get enough fluid.  Avoid liquids with milk, cream, or pulp while you are on this diet. This information is not intended to replace advice given to you by your health care provider. Make sure you discuss any questions you have with your health care provider. Document Revised: 01/09/2020 Document Reviewed: 01/09/2020 Elsevier Patient Education  2021 [employer].

## 2020-07-02 NOTE — Progress Notes (Signed)
[facility] [place], [place] [address]  Internal MEDICINE  Telephone Visit  Patient Name: Teresa Crawford  [id number]  [id number]  Date of Service: 07/02/2020  I connected with the patient at 11:02 by telephone and verified the patients identity using two identifiers.   I discussed the limitations, risks, security and privacy concerns of performing an evaluation and management service by telephone and the availability of in person appointments. I also discussed with the patient that there may be a patient responsible charge related to the service.  The patient expressed understanding and agrees to proceed.    Chief Complaint  Patient presents with  . Telephone Assessment    [id number]  . Telephone Screen  . Diarrhea  . Nausea  . Vomiting    Going on for 7 days   . Chills  . Sinusitis  . Fatigue    HPI Pt is here for sick visit. -She feels like she has a stomach virus. Friend who she had been around was also sick but he is getting better after a week. Her symptoms started Tuesday. She has been vomiting and having diarrhea. Denied blood in stool. Admits to chills and has body aches. -Vaccinated to covid and flu. Denies fever. Admits sore throat and headache. Before this started she had pain R ear but that improved. She tried taking pepto bismol, zofran, and tramadol but these didn't help much. Abdominal pain and body aches seem to be worsening. Tried chicken noodle soup, crackers, and water.  Current Medication: Outpatient Encounter Medications as of 07/02/2020  Medication Sig  . acetaminophen (TYLENOL) 500 MG tablet 2 TABS PO BID PRN FOR HEADACHES AND PAINS  . albuterol (PROAIR HFA) 108 (90 Base) MCG/ACT inhaler Inhale 2 puffs into the lungs every 6 (six) hours as needed for wheezing or shortness of breath.  Marland Kitchen buPROPion (WELLBUTRIN) 75 MG tablet TAKE 1 TABLET BY MOUTH DAILY WITH BREAKFAST  . cetirizine (ZYRTEC) 10 MG tablet TAKE 1 TABLET BY MOUTH AT  BEDTIME FOR ALLERGIES  . diazepam (VALIUM) 5 MG tablet Insert 1 tablet intravaginally once daily at bedtime.  . docusate sodium (DOK) 100 MG capsule TAKE 1 CAPSULE BY MOUTH TWICE DAILY FOR STOOL SOFTENER  . EPINEPHrine 0.3 mg/0.3 mL IJ SOAJ injection USE FOR ANAPHYLAXIS  . fluticasone (FLONASE) 50 MCG/ACT nasal spray USE 1 SPRAY INTO EACH NOSTRIL ONCE DAILYFOR CONGESTION OR ALLERGIES  . fluticasone (FLOVENT HFA) 220 MCG/ACT inhaler INHALE 2 PUFFS INTO THE LUNGS, 2 TIMES PER DAY. RINSE MOUTH AFTER INHALATIONS  . ibuprofen (ADVIL) 600 MG tablet ONE TAB PO BID PRN FOR PAIN AND HEADACHES  . omeprazole (PRILOSEC) 40 MG capsule Take 1 capsule (40 mg total) by mouth 2 (two) times daily.  . promethazine (PHENERGAN) 12.5 MG tablet Take 1 tablet (12.5 mg total) by mouth every 8 (eight) hours as needed for nausea or vomiting.  . topiramate (TOPAMAX) 50 MG tablet TAKE 1 TABLET BY MOUTH EVERY EVENING  . traMADol (ULTRAM) 50 MG tablet ONE TAB PO BID PRN FOR PAIN AND HEADCAHES  . traZODone (DESYREL) 50 MG tablet Take 0.5 tablets (25 mg total) by mouth at bedtime.   No facility-administered encounter medications on file as of 07/02/2020.    Surgical History: Past Surgical History:  Procedure Laterality Date  . ABDOMINAL HYSTERECTOMY    . BACK SURGERY    . COLONOSCOPY  04/27/2006   Wohl-colitis, internal hemorrhoids  . COLONOSCOPY  02/2013   focal right colitis, left colon biopsy negative,  focal active proctitis without chronicity  . ESOPHAGOGASTRODUODENOSCOPY  02/2013   multiple peptic & duodenal ulcers, negative h pylori  . INTRAUTERINE DEVICE INSERTION  2012  . LAPAROSCOPIC ENDOMETRIOSIS FULGURATION  2015   [name]  . OVARIAN CYST REMOVAL      Medical History: Past Medical History:  Diagnosis Date  . B12 deficiency anemia   . Chronic back pain   . Chronic fatigue syndrome   . Constipation   . Depression   . Elevated liver enzymes   . Endometriosis   . Fibroids   . Hepatic steatosis   .  Hypothyroid   . Last menstrual period (LMP) > 10 days ago 09/12/15  . Mental retardation   . Migraine   . Post traumatic stress disorder   . Posttraumatic stress disorder   . PUD (peptic ulcer disease)   . Scoliosis   . Splenomegaly   . Uterine fibroid     Family History: Family History  Problem Relation Age of Onset  . Breast cancer Mother 14  . COPD Father   . Lung cancer Father 76  . Dementia Father   . Diabetes Sister   . Diabetes Brother   . Ovarian cancer Paternal Aunt   . Colon cancer Maternal Grandfather 98  . Liver disease Maternal Aunt        ?etiology  . Lymphoma Maternal Grandmother 60  . Lung cancer Other 64       maternal great grandfather  . Stomach cancer Other 29       maternal great grandmother    Social History   Socioeconomic History  . Marital status: Single    Spouse name: Not on file  . Number of children: 0  . Years of education: Not on file  . Highest education level: Not on file  Occupational History  . Not on file  Tobacco Use  . Smoking status: Never Smoker  . Smokeless tobacco: Never Used  Vaping Use  . Vaping Use: Never used  Substance and Sexual Activity  . Alcohol use: Not Currently    Alcohol/week: 0.0 standard drinks  . Drug use: No  . Sexual activity: Not Currently  Other Topics Concern  . Not on file  Social History Narrative   She has brother, half brother & sister live nearby   Both parents deceased   Care managed by [name]   Social Determinants of Health   Financial Resource Strain: Not on file  Food Insecurity: Not on file  Transportation Needs: Not on file  Physical Activity: Not on file  Stress: Not on file  Social Connections: Not on file  Intimate Partner Violence: Not on file      Review of Systems  Constitutional: Positive for chills. Negative for fatigue and fever.  HENT: Negative for congestion, mouth sores and postnasal drip.   Respiratory: Negative for cough.   Cardiovascular: Negative for  chest pain.  Gastrointestinal: Positive for abdominal pain, diarrhea, nausea and vomiting. Negative for abdominal distention, blood in stool and constipation.  Genitourinary: Negative for flank pain.  Musculoskeletal: Positive for myalgias.  Psychiatric/Behavioral: Negative.     Vital Signs: Ht 5\' 3"  (1.6 m)   Wt 210 lb (95.3 kg)   LMP 01/05/2016   BMI 37.20 kg/m    Observation/Objective:  Pt is able to carry out conversation.   Assessment/Plan: 1. Non-intractable vomiting with nausea, unspecified vomiting type Will try phenergan to help with nausea. Recommend she do clear liquid diet until she is starting  to feel better. - promethazine (PHENERGAN) 12.5 MG tablet; Take 1 tablet (12.5 mg total) by mouth every 8 (eight) hours as needed for nausea or vomiting.  Dispense: 20 tablet; Refill: 0  2. Diarrhea, unspecified type Educated to do clear liquid diet and slowly introduce BRAT diet as able once she is feeling better. Educated on importance of staying well hydrated with water and gatorade/pedialyte. Recommend she take small sips and take phenergan to help nausea.  3. Acute abdominal pain Generalized abdominal pain since N/V/D started. Will switch to clear liquid diet and stay well hydrated. If not improving in the next day or two may need labs and abdominal Xray for further evaluation. Educated to go to ED if acute worsening.   General Counseling: kathryne ramella understanding of the findings of today's phone visit and agrees with plan of treatment. I have discussed any further diagnostic evaluation that may be needed or ordered today. We also reviewed her medications today. she has been encouraged to call the office with any questions or concerns that should arise related to todays visit.    No orders of the defined types were placed in this encounter.   Meds ordered this encounter  Medications  . promethazine (PHENERGAN) 12.5 MG tablet    Sig: Take 1 tablet (12.5 mg  total) by mouth every 8 (eight) hours as needed for nausea or vomiting.    Dispense:  20 tablet    Refill:  0    Time spent:30 Minutes    Dr Lavera Guise Internal medicine

## 2020-07-23 ENCOUNTER — Ambulatory Visit (INDEPENDENT_AMBULATORY_CARE_PROVIDER_SITE_OTHER): Payer: Medicare Other | Admitting: Licensed Clinical Social Worker

## 2020-07-23 ENCOUNTER — Other Ambulatory Visit: Payer: Self-pay

## 2020-07-23 DIAGNOSIS — F4310 Post-traumatic stress disorder, unspecified: Secondary | ICD-10-CM

## 2020-07-23 NOTE — Progress Notes (Signed)
Virtual Visit via Video Note  I connected with [name] on 07/23/20 at  9:00 AM EDT by a video enabled telemedicine application and verified that I am speaking with the correct person using two identifiers.  Location: Patient: home Provider: remote office [place], [name]   I discussed the limitations of evaluation and management by telemedicine and the availability of in person appointments. The patient expressed understanding and agreed to proceed.   I discussed the assessment and treatment plan with the patient. The patient was provided an opportunity to ask questions and all were answered. The patient agreed with the plan and demonstrated an understanding of the instructions.   The patient was advised to call back or seek an in-person evaluation if the symptoms worsen or if the condition fails to improve as anticipated.  I provided 60 minutes of non-face-to-face time during this encounter.   [place], LCSW    THERAPIST PROGRESS NOTE  Session Time: 9-10a  Participation Level: Active  Behavioral Response: Neat and Well GroomedAlertAnxious  Type of Therapy: Individual Therapy  Treatment Goals addressed: Anxiety and Coping  Interventions: CBT and Social Skills Training  Summary: EMMAGENE ORTNER is a 35 y.o. female who presents with symptoms associated with PTSD.  Pt reports that current mood is stable and that she is managing stress and anxiety well. Pt reports good quality and quantity of sleep. Pts primary external stressors include work-related stress and relationship stress.  Allowed pt to explore and express thoughts and feelings related to work stressors and relationship stressors. 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  Continued recommendations are as follows: self care behaviors, positive social engagements,  focusing on overall work/home/life balance, and focusing on positive physical and emotional wellness.    Suicidal/Homicidal: No  Therapist Response: Janaysia is reporting that her mood is elevated and that she is able to engage in activities and socialize on a regular basis. Latreshia is trying hard to replace negative self defeating talk with more realistic and positive cognitive messages. Kymberlee is continuing to develop effective coping skills to carry out normal responsibilities and participate constructively in healthy relationships. These behaviors are reflective of both personal growth and progress. Treatment to continue as indicated.  Plan: Return again in 4 weeks.  Diagnosis: Axis I: Post Traumatic Stress Disorder    Axis II: No diagnosis    Rachel Bo Ghalia Reicks, LCSW 07/23/2020

## 2020-07-24 ENCOUNTER — Telehealth (INDEPENDENT_AMBULATORY_CARE_PROVIDER_SITE_OTHER): Payer: Medicare Other | Admitting: Psychiatry

## 2020-07-24 ENCOUNTER — Encounter: Payer: Self-pay | Admitting: Psychiatry

## 2020-07-24 DIAGNOSIS — F5105 Insomnia due to other mental disorder: Secondary | ICD-10-CM | POA: Diagnosis not present

## 2020-07-24 DIAGNOSIS — F4310 Post-traumatic stress disorder, unspecified: Secondary | ICD-10-CM | POA: Diagnosis not present

## 2020-07-24 DIAGNOSIS — F3342 Major depressive disorder, recurrent, in full remission: Secondary | ICD-10-CM

## 2020-07-24 NOTE — Progress Notes (Signed)
Virtual Visit via Video Note  I connected with Teresa Crawford on 07/24/20 at 11:00 AM EDT by a video enabled telemedicine application and verified that I am speaking with the correct person using two identifiers.  Location Provider Location : [facility] Patient Location : Home  Participants: Patient , Provider    I discussed the limitations of evaluation and management by telemedicine and the availability of in person appointments. The patient expressed understanding and agreed to proceed.  I discussed the assessment and treatment plan with the patient. The patient was provided an opportunity to ask questions and all were answered. The patient agreed with the plan and demonstrated an understanding of the instructions.   The patient was advised to call back or seek an in-person evaluation if the symptoms worsen or if the condition fails to improve as anticipated.  [place] MD OP Progress Note  07/24/2020 11:18 AM Teresa Crawford  MRN:  [id number]  Chief Complaint:  Chief Complaint    Follow-up; Anxiety; Depression     HPI: Teresa Crawford is a 35 year old Caucasian female who is single, lives in [place], has a history of MDD, PTSD, insomnia, bereavement was evaluated by telemedicine today.  Patient today reports she is currently overwhelmed at work.  She reports they stay so busy that it is difficult to handle at times.  She however reports she loves to go to work in spite of that.  She is currently on a reduced schedule and works 2 days a week, Mondays and Thursdays only for 4 hours.  She reports she developed flulike symptoms of nausea, diarrhea and vomiting end of March.  She reports she was evaluated by her provider and she is making progress.  She continues to have occasional nausea and vomiting.  She is currently taking Prilosec.  Patient reports sleep is overall okay.  She denies suicidality or homicidality.  She is currently in therapy with Ms. Christina Hussami, reports therapy  sessions are beneficial.  She is compliant on medications.  Denies side effects.  She reports she enjoys spending time with her sister who currently lives closer.  Visit Diagnosis:    ICD-10-CM   1. MDD (major depressive disorder), recurrent, in full remission ([place])  F33.42   2. Posttraumatic stress disorder  F43.10   3. Insomnia due to mental condition  F51.05     Past Psychiatric History: Reviewed past psychiatric history from progress note on 08/16/2018  Past Medical History:  Past Medical History:  Diagnosis Date  . B12 deficiency anemia   . Chronic back pain   . Chronic fatigue syndrome   . Constipation   . Depression   . Elevated liver enzymes   . Endometriosis   . Fibroids   . Hepatic steatosis   . Hypothyroid   . Last menstrual period (LMP) > 10 days ago 09/12/15  . Mental retardation   . Migraine   . Post traumatic stress disorder   . Posttraumatic stress disorder   . PUD (peptic ulcer disease)   . Scoliosis   . Splenomegaly   . Uterine fibroid     Past Surgical History:  Procedure Laterality Date  . ABDOMINAL HYSTERECTOMY    . BACK SURGERY    . COLONOSCOPY  04/27/2006   Wohl-colitis, internal hemorrhoids  . COLONOSCOPY  02/2013   focal right colitis, left colon biopsy negative, focal active proctitis without chronicity  . ESOPHAGOGASTRODUODENOSCOPY  02/2013   multiple peptic & duodenal ulcers, negative h pylori  . INTRAUTERINE DEVICE  INSERTION  2012  . LAPAROSCOPIC ENDOMETRIOSIS FULGURATION  2015   [name]  . OVARIAN CYST REMOVAL      Family Psychiatric History: Reviewed family psychiatric history from progress note on 08/16/2018  Family History:  Family History  Problem Relation Age of Onset  . Breast cancer Mother 36  . COPD Father   . Lung cancer Father 75  . Dementia Father   . Diabetes Sister   . Diabetes Brother   . Ovarian cancer Paternal Aunt   . Colon cancer Maternal Grandfather 48  . Liver disease Maternal Aunt        ?etiology  .  Lymphoma Maternal Grandmother 60  . Lung cancer Other 62       maternal great grandfather  . Stomach cancer Other 37       maternal great grandmother    Social History: Reviewed social history from progress note on 08/16/2018 Social History   Socioeconomic History  . Marital status: Single    Spouse name: Not on file  . Number of children: 0  . Years of education: Not on file  . Highest education level: Not on file  Occupational History  . Not on file  Tobacco Use  . Smoking status: Never Smoker  . Smokeless tobacco: Never Used  Vaping Use  . Vaping Use: Never used  Substance and Sexual Activity  . Alcohol use: Not Currently    Alcohol/week: 0.0 standard drinks  . Drug use: No  . Sexual activity: Not Currently  Other Topics Concern  . Not on file  Social History Narrative   She has brother, half brother & sister live nearby   Both parents deceased   Care managed by [name]   Social Determinants of Health   Financial Resource Strain: Not on file  Food Insecurity: Not on file  Transportation Needs: Not on file  Physical Activity: Not on file  Stress: Not on file  Social Connections: Not on file    Allergies:  Allergies  Allergen Reactions  . Other Anaphylaxis    Peanut butter, uses Epi pen  . Peanut Butter Flavor Swelling    Throat swells Throat swells  . Sertraline Other (See Comments)    Panic attacks  . Codeine Nausea And Vomiting    nausea  . Paroxetine Other (See Comments)    Panic Attack   . Paroxetine Hcl Other (See Comments)    Panic attack Other reaction(s): Other (See Comments) Panic attacks  . Venlafaxine Other (See Comments)    Panic Attack   . Zoloft [Sertraline Hcl] Other (See Comments)    Panic attack    Metabolic Disorder Labs: Lab Results  Component Value Date   HGBA1C 5.5 12/20/2019   No results found for: PROLACTIN Lab Results  Component Value Date   CHOL 169 12/20/2019   TRIG 117 12/20/2019   HDL 34 (L) 12/20/2019    LDLCALC 114 (H) 12/20/2019   LDLCALC 100 (H) 12/09/2018   Lab Results  Component Value Date   TSH 3.820 12/20/2019   TSH 2.740 12/09/2018    Therapeutic Level Labs: No results found for: LITHIUM No results found for: VALPROATE No components found for:  CBMZ  Current Medications: Current Outpatient Medications  Medication Sig Dispense Refill  . acetaminophen (TYLENOL) 500 MG tablet 2 TABS PO BID PRN FOR HEADACHES AND PAINS 45 tablet 3  . albuterol (PROAIR HFA) 108 (90 Base) MCG/ACT inhaler Inhale 2 puffs into the lungs every 6 (six) hours as needed  for wheezing or shortness of breath. 1 Inhaler 3  . buPROPion (WELLBUTRIN) 75 MG tablet TAKE 1 TABLET BY MOUTH DAILY WITH BREAKFAST 90 tablet 0  . cetirizine (ZYRTEC) 10 MG tablet TAKE 1 TABLET BY MOUTH AT BEDTIME FOR ALLERGIES 30 tablet 3  . diazepam (VALIUM) 5 MG tablet Insert 1 tablet intravaginally once daily at bedtime. 30 tablet 2  . docusate sodium (DOK) 100 MG capsule TAKE 1 CAPSULE BY MOUTH TWICE DAILY FOR STOOL SOFTENER 60 capsule 3  . EPINEPHrine 0.3 mg/0.3 mL IJ SOAJ injection USE FOR ANAPHYLAXIS 1 each 5  . fluticasone (FLONASE) 50 MCG/ACT nasal spray USE 1 SPRAY INTO EACH NOSTRIL ONCE DAILYFOR CONGESTION OR ALLERGIES 48 g 3  . fluticasone (FLOVENT HFA) 220 MCG/ACT inhaler INHALE 2 PUFFS INTO THE LUNGS, 2 TIMES PER DAY. RINSE MOUTH AFTER INHALATIONS 12 g 11  . ibuprofen (ADVIL) 600 MG tablet ONE TAB PO BID PRN FOR PAIN AND HEADACHES 30 tablet 3  . omeprazole (PRILOSEC) 40 MG capsule Take 1 capsule (40 mg total) by mouth 2 (two) times daily. 60 capsule 6  . promethazine (PHENERGAN) 12.5 MG tablet Take 1 tablet (12.5 mg total) by mouth every 8 (eight) hours as needed for nausea or vomiting. 20 tablet 0  . topiramate (TOPAMAX) 50 MG tablet TAKE 1 TABLET BY MOUTH EVERY EVENING 30 tablet 3  . traMADol (ULTRAM) 50 MG tablet ONE TAB PO BID PRN FOR PAIN AND HEADCAHES 20 tablet 3  . traZODone (DESYREL) 50 MG tablet Take 0.5 tablets (25  mg total) by mouth at bedtime. 45 tablet 0   No current facility-administered medications for this visit.     Musculoskeletal: Strength & Muscle Tone: UTA Gait & Station: UTA Patient leans: N/A  Psychiatric Specialty Exam: Review of Systems  Gastrointestinal: Positive for nausea and vomiting.  Psychiatric/Behavioral: The patient is nervous/anxious.   All other systems reviewed and are negative.   Last menstrual period 01/05/2016.There is no height or weight on file to calculate BMI.  General Appearance: Casual  Eye Contact:  Fair  Speech:  Clear and Coherent  Volume:  Normal  Mood:  Anxious Coping well  Affect:  Appropriate  Thought Process:  Goal Directed and Descriptions of Associations: Intact  Orientation:  Full (Time, Place, and Person)  Thought Content: Logical   Suicidal Thoughts:  No  Homicidal Thoughts:  No  Memory:  Immediate;   Fair Recent;   Fair Remote;   Fair  Judgement:  Fair  Insight:  Fair  Psychomotor Activity:  Normal  Concentration:  Concentration: Fair and Attention Span: Fair  Recall:  [employer] of Knowledge: Fair  Language: Fair  Akathisia:  No  Handed:  Right  AIMS (if indicated): UTA  Assets:  Communication Skills Desire for Improvement Housing Social Support  ADL's:  Intact  Cognition: WNL  Sleep:  Fair   Screenings: Mini-Mental   Flowsheet Row Clinical Support from 12/19/2019 in [facility] Office Visit from 10/13/2017 in [facility], [facility]  Total Score (max 30 points ) 30 27    PHQ2-9   Flowsheet Row Counselor from 07/23/2020 in [place] Video Visit from 06/19/2020 in [place] Office Visit from 05/21/2020 in [facility] [place] from 12/19/2019 in [facility], [facility] Video Visit from 12/18/2019 in [place]  PHQ-2 Total Score 0 0 0 0 6  PHQ-9 Total Score -- -- -- -- 16  Flowsheet Row Counselor from 07/23/2020 in [place] Video Visit from 06/19/2020 in [place] Counselor from 06/04/2020 in [place] No Risk No Risk No Risk       Assessment and Plan: Teresa Crawford is a 35 year old Caucasian female, single, currently works part-time on [employer], has a history of MDD, PTSD, insomnia was evaluated by telemedicine today.  Patient is currently making progress however does have nausea and vomiting although making progress.  Plan as noted below.  Plan MDD in remission Wellbutrin 75 mg p.o. daily Trazodone 25 mg p.o. nightly-reduced dosage Continue CBT with Ms. Christina Hussami  PTSD-improving Continue CBT  Insomnia-improving Trazodone 25 mg p.o. nightly-reduced dosage  Patient advised to follow-up with her primary care provider for her nausea and vomiting.  Follow-up in clinic in 2 months or sooner if needed.  This note was generated in part or whole with voice recognition software. Voice recognition is usually quite accurate but there are transcription errors that can and very often do occur. I apologize for any typographical errors that were not detected and corrected.      Ursula Alert, MD 07/25/2020, 10:54 AM

## 2020-08-20 ENCOUNTER — Other Ambulatory Visit: Payer: Self-pay

## 2020-08-20 ENCOUNTER — Ambulatory Visit (INDEPENDENT_AMBULATORY_CARE_PROVIDER_SITE_OTHER): Payer: Medicare Other | Admitting: Licensed Clinical Social Worker

## 2020-08-20 DIAGNOSIS — F4310 Post-traumatic stress disorder, unspecified: Secondary | ICD-10-CM
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
## 2020-08-22 ENCOUNTER — Other Ambulatory Visit: Payer: Self-pay | Admitting: Internal Medicine

## 2020-08-22 ENCOUNTER — Other Ambulatory Visit: Payer: Self-pay | Admitting: Nurse Practitioner

## 2020-09-07 DIAGNOSIS — L608 Other nail disorders: Secondary | ICD-10-CM | POA: Diagnosis not present

## 2020-09-07 DIAGNOSIS — S6722XA Crushing injury of left hand, initial encounter: Secondary | ICD-10-CM | POA: Diagnosis not present

## 2020-09-09 ENCOUNTER — Other Ambulatory Visit: Payer: Self-pay | Admitting: Nurse Practitioner

## 2020-09-09 DIAGNOSIS — J301 Allergic rhinitis due to pollen: Secondary | ICD-10-CM

## 2020-09-09 DIAGNOSIS — G43009 Migraine without aura, not intractable, without status migrainosus: Secondary | ICD-10-CM

## 2020-09-12 DIAGNOSIS — S6702XA Crushing injury of left thumb, initial encounter: Secondary | ICD-10-CM | POA: Diagnosis not present

## 2020-09-18 ENCOUNTER — Other Ambulatory Visit: Payer: Self-pay | Admitting: Internal Medicine

## 2020-09-18 ENCOUNTER — Ambulatory Visit (INDEPENDENT_AMBULATORY_CARE_PROVIDER_SITE_OTHER): Payer: Medicare Other | Admitting: Licensed Clinical Social Worker

## 2020-09-18 ENCOUNTER — Other Ambulatory Visit: Payer: Self-pay

## 2020-09-18 DIAGNOSIS — F4310 Post-traumatic stress disorder, unspecified: Secondary | ICD-10-CM | POA: Diagnosis not present

## 2020-09-18 NOTE — Progress Notes (Signed)
Virtual Visit via Video Note  I connected with Teresa Crawford on 09/18/20 at  2:00 PM EDT by a video enabled telemedicine application and verified that I am speaking with the correct person using two identifiers.  Location: Patient: home Provider: [facility]   I discussed the limitations of evaluation and management by telemedicine and the availability of in person appointments. The patient expressed understanding and agreed to proceed.   I discussed the assessment and treatment plan with the patient. The patient was provided an opportunity to ask questions and all were answered. The patient agreed with the plan and demonstrated an understanding of the instructions.   The patient was advised to call back or seek an in-person evaluation if the symptoms worsen or if the condition fails to improve as anticipated.  I provided 60 minutes of non-face-to-face time during this encounter.   Teresa Ballow R Julieanne Hadsall, LCSW   THERAPIST PROGRESS NOTE  Session Time: 2-3p  Participation Level: Active  Behavioral Response: Neat and Well GroomedAlertAnxious  Type of Therapy: Individual Therapy  Treatment Goals addressed: Anxiety  Interventions: Social Skills Training  Summary: Teresa Crawford is a 35 y.o. female who presents with improving symptoms related to PTSD.  Pt reports that overall mood is stable and that she is managing situational anxiety and stressors well. Pt reporting good quality and quantity of sleep.  Allowed pt to explore and express thoughts and feelings associated with recent life situations and external stressors. Explored relationships in depth--Chris versus Will. Challenged pt to see outside of her own perspective and co compare the reality base of what the relationships are versus what she wants them to be.  Pt continues to feel like her relationship with Will is that they are "twin flames" and her relationship with Gerald Stabs is "soul mates".   Pt reports that she is trying hard to  make herself a priority and is focusing on self care and identifying her own needs and wants in a relationship and being able to express those freely.    Continued recommendations are as follows: self care behaviors, positive social engagements, focusing on overall work/home/life balance, and focusing on positive physical and emotional wellness.    Suicidal/Homicidal: No  Therapist Response: Teresa Crawford is reporting that her mood is elevated and that she is able to engage in activities and socialize on a regular basis. Teresa Crawford is trying hard to replace negative self defeating talk with more realistic and positive cognitive messages. Teresa Crawford is continuing to develop effective coping skills to carry out normal responsibilities and participate constructively in healthy relationships. These behaviors are reflective of both personal growth and progress. Treatment to continue as indicated.  Plan: Return again in 4 weeks.  Diagnosis: Axis I: Post Traumatic Stress Disorder    Axis II: No diagnosis    Teresa Bo Tanya [place], LCSW 09/18/2020

## 2020-09-24 ENCOUNTER — Telehealth: Payer: Self-pay

## 2020-09-24 ENCOUNTER — Other Ambulatory Visit: Payer: Self-pay | Admitting: Nurse Practitioner

## 2020-09-24 ENCOUNTER — Ambulatory Visit (INDEPENDENT_AMBULATORY_CARE_PROVIDER_SITE_OTHER): Payer: Medicare Other | Admitting: Nurse Practitioner

## 2020-09-24 ENCOUNTER — Encounter: Payer: Self-pay | Admitting: Nurse Practitioner

## 2020-09-24 ENCOUNTER — Other Ambulatory Visit: Payer: Self-pay

## 2020-09-24 ENCOUNTER — Other Ambulatory Visit: Payer: Self-pay | Admitting: Internal Medicine

## 2020-09-24 VITALS — BP 124/76 | HR 65 | Temp 98.2°F | Resp 16 | Ht 65.0 in | Wt 201.2 lb

## 2020-09-24 DIAGNOSIS — J301 Allergic rhinitis due to pollen: Secondary | ICD-10-CM

## 2020-09-24 DIAGNOSIS — F4310 Post-traumatic stress disorder, unspecified: Secondary | ICD-10-CM

## 2020-09-24 DIAGNOSIS — F331 Major depressive disorder, recurrent, moderate: Secondary | ICD-10-CM | POA: Diagnosis not present

## 2020-09-24 DIAGNOSIS — G43009 Migraine without aura, not intractable, without status migrainosus: Secondary | ICD-10-CM | POA: Diagnosis not present

## 2020-09-24 NOTE — Telephone Encounter (Signed)
Received a refill request for a controlled substance for patient. Called to schedule an appointment.Patient will call back to make an appointment. Teresa Crawford

## 2020-09-24 NOTE — Progress Notes (Signed)
[facility] [place], [place] [address]  Internal MEDICINE  Office Visit Note  Patient Name: Teresa Crawford  [id number]  [id number]  Date of Service: 10/02/2020  Chief Complaint  Patient presents with   Follow-up    Med refill    HPI [name] presents for a follow up visit for medication refills. She has a history of anemia, anxiety, depression, migraines, and hypothyroidism. She denies any pain at this time. She denies any additional questions or concerns for today's visit.    Current Medication: Outpatient Encounter Medications as of 09/24/2020  Medication Sig   albuterol (PROAIR HFA) 108 (90 Base) MCG/ACT inhaler Inhale 2 puffs into the lungs every 6 (six) hours as needed for wheezing or shortness of breath.   cetirizine (ZYRTEC) 10 MG tablet TAKE 1 TABLET BY MOUTH AT BEDTIME FOR ALLERGIES   docusate sodium (DOK) 100 MG capsule TAKE 1 CAPSULE BY MOUTH TWICE DAILY FOR STOOL SOFTENER   EPINEPHrine 0.3 mg/0.3 mL IJ SOAJ injection USE FOR ANAPHYLAXIS (Patient not taking: Reported on 09/25/2020)   fluticasone (FLOVENT HFA) 220 MCG/ACT inhaler INHALE 2 PUFFS INTO THE LUNGS, 2 TIMES PER DAY. RINSE MOUTH AFTER INHALATIONS   promethazine (PHENERGAN) 12.5 MG tablet Take 1 tablet (12.5 mg total) by mouth every 8 (eight) hours as needed for nausea or vomiting. (Patient not taking: Reported on 09/25/2020)   topiramate (TOPAMAX) 50 MG tablet TAKE 1 TABLET BY MOUTH EVERY EVENING   [DISCONTINUED] buPROPion (WELLBUTRIN) 75 MG tablet TAKE 1 TABLET BY MOUTH DAILY WITH BREAKFAST   [DISCONTINUED] diazepam (VALIUM) 5 MG tablet INSERT 1 TABLET INTRAVAGINALLY ONCE DAILY AT BEDTIME   [DISCONTINUED] traZODone (DESYREL) 50 MG tablet Take 0.5 tablets (25 mg total) by mouth at bedtime.   acetaminophen (TYLENOL) 500 MG tablet 2 TABS PO BID PRN FOR HEADACHES AND PAINS   fluticasone (FLONASE) 50 MCG/ACT nasal spray Place 2 sprays into both nostrils daily.   ibuprofen (ADVIL) 600 MG tablet  ONE TAB PO BID PRN FOR PAIN AND HEADACHES   omeprazole (PRILOSEC) 40 MG capsule Take 1 capsule (40 mg total) by mouth 2 (two) times daily.   traMADol (ULTRAM) 50 MG tablet ONE TAB PO BID PRN FOR PAIN AND HEADCAHES   [DISCONTINUED] acetaminophen (TYLENOL) 500 MG tablet 2 TABS PO BID PRN FOR HEADACHES AND PAINS   [DISCONTINUED] fluticasone (FLONASE) 50 MCG/ACT nasal spray USE 1 SPRAY INTO EACH NOSTRIL ONCE DAILYFOR CONGESTION OR ALLERGIES   [DISCONTINUED] ibuprofen (ADVIL) 600 MG tablet ONE TAB PO BID PRN FOR PAIN AND HEADACHES   [DISCONTINUED] omeprazole (PRILOSEC) 40 MG capsule Take 1 capsule (40 mg total) by mouth 2 (two) times daily.   [DISCONTINUED] traMADol (ULTRAM) 50 MG tablet ONE TAB PO BID PRN FOR PAIN AND HEADCAHES   No facility-administered encounter medications on file as of 09/24/2020.    Surgical History: Past Surgical History:  Procedure Laterality Date   ABDOMINAL HYSTERECTOMY     BACK SURGERY     COLONOSCOPY  04/27/2006   Wohl-colitis, internal hemorrhoids   COLONOSCOPY  02/2013   focal right colitis, left colon biopsy negative, focal active proctitis without chronicity   ESOPHAGOGASTRODUODENOSCOPY  02/2013   multiple peptic & duodenal ulcers, negative h pylori   INTRAUTERINE DEVICE INSERTION  2012   LAPAROSCOPIC ENDOMETRIOSIS FULGURATION  2015   [name]   OVARIAN CYST REMOVAL      Medical History: Past Medical History:  Diagnosis Date   B12 deficiency anemia    Chronic back pain  Chronic fatigue syndrome    Constipation    Depression    Elevated liver enzymes    Endometriosis    Fibroids    Hepatic steatosis    Hypothyroid    Last menstrual period (LMP) > 10 days ago 09/12/15   Mental retardation    Migraine    Post traumatic stress disorder    Posttraumatic stress disorder    PUD (peptic ulcer disease)    Scoliosis    Splenomegaly    Uterine fibroid     Family History: Family History  Problem Relation Age of Onset   Breast cancer Mother 33   COPD  Father    Lung cancer Father 62   Dementia Father    Diabetes Sister    Diabetes Brother    Ovarian cancer Paternal Aunt    Colon cancer Maternal Grandfather 20   Liver disease Maternal Aunt        ?etiology   Lymphoma Maternal Grandmother 30   Lung cancer Other 6       maternal great grandfather   Stomach cancer Other 69       maternal great grandmother    Social History   Socioeconomic History   Marital status: Single    Spouse name: Not on file   Number of children: 0   Years of education: Not on file   Highest education level: Not on file  Occupational History   Not on file  Tobacco Use   Smoking status: Never   Smokeless tobacco: Never  Vaping Use   Vaping Use: Never used  Substance and Sexual Activity   Alcohol use: Not Currently    Alcohol/week: 0.0 standard drinks   Drug use: No   Sexual activity: Not Currently  Other Topics Concern   Not on file  Social History Narrative   She has brother, half brother & sister live nearby   Both parents deceased   Care managed by [name]   Social Determinants of Health   Financial Resource Strain: Not on file  Food Insecurity: Not on file  Transportation Needs: Not on file  Physical Activity: Not on file  Stress: Not on file  Social Connections: Not on file  Intimate Partner Violence: Not on file      Review of Systems  Constitutional:  Negative for chills, fatigue and unexpected weight change.  HENT:  Negative for congestion, rhinorrhea, sneezing and sore throat.   Eyes:  Negative for redness.  Respiratory:  Negative for cough, chest tightness and shortness of breath.   Cardiovascular:  Negative for chest pain and palpitations.  Gastrointestinal:  Negative for abdominal pain, constipation, diarrhea, nausea and vomiting.  Genitourinary:  Negative for dysuria and frequency.  Musculoskeletal:  Negative for arthralgias, back pain, joint swelling and neck pain.  Skin:  Negative for rash.  Neurological:   Positive for headaches (migraines). Negative for dizziness, tremors and numbness.  Hematological:  Negative for adenopathy. Does not bruise/bleed easily.  Psychiatric/Behavioral:  Negative for behavioral problems (Depression), sleep disturbance and suicidal ideas. The patient is not nervous/anxious.    Vital Signs: BP 124/76   Pulse 65   Temp 98.2 F (36.8 C)   Resp 16   Ht 5\' 5"  (1.651 m)   Wt 201 lb 3.2 oz (91.3 kg)   LMP 01/05/2016   SpO2 97%   BMI 33.48 kg/m    Physical Exam Vitals reviewed.  Constitutional:      General: She is not in acute distress.  Appearance: Normal appearance. She is obese. She is not ill-appearing.  HENT:     Head: Normocephalic and atraumatic.  Cardiovascular:     Rate and Rhythm: Normal rate and regular rhythm.     Pulses: Normal pulses.     Heart sounds: Normal heart sounds.  Pulmonary:     Effort: Pulmonary effort is normal.     Breath sounds: Normal breath sounds.  Skin:    General: Skin is warm and dry.     Capillary Refill: Capillary refill takes less than 2 seconds.  Neurological:     Mental Status: She is alert and oriented to person, place, and time.  Psychiatric:        Mood and Affect: Mood normal.        Behavior: Behavior normal.       Assessment/Plan: 1. Migraine without aura, not intractable, without status migrainosus [name] experiences migraines that she manages with as needed ibuprofen or acetaminophen and when the migraines are more severe she takes tramadol. Refills sent to pharmacy.  - traMADol (ULTRAM) 50 MG tablet; ONE TAB PO BID PRN FOR PAIN AND HEADCAHES  Dispense: 20 tablet; Refill: 3 - ibuprofen (ADVIL) 600 MG tablet; ONE TAB PO BID PRN FOR PAIN AND HEADACHES  Dispense: 30 tablet; Refill: 3 - acetaminophen (TYLENOL) 500 MG tablet; 2 TABS PO BID PRN FOR HEADACHES AND PAINS  Dispense: 45 tablet; Refill: 3  2. Allergic rhinitis due to pollen, unspecified seasonality Allergic rhinitis symptoms managed with  flonase nasal spray and omeprazole.  - fluticasone (FLONASE) 50 MCG/ACT nasal spray; Place 2 sprays into both nostrils daily.  Dispense: 16 g; Refill: 2 - omeprazole (PRILOSEC) 40 MG capsule; Take 1 capsule (40 mg total) by mouth 2 (two) times daily.  Dispense: 60 capsule; Refill: 6  3. Major depressive disorder, recurrent episode, moderate ([place]) Managed and followed by Dr. Shea Evans.  General Counseling: tonni mansour understanding of the findings of todays visit and agrees with plan of treatment. I have discussed any further diagnostic evaluation that may be needed or ordered today. We also reviewed her medications today. she has been encouraged to call the office with any questions or concerns that should arise related to todays visit.    No orders of the defined types were placed in this encounter.   Meds ordered this encounter  Medications   fluticasone (FLONASE) 50 MCG/ACT nasal spray    Sig: Place 2 sprays into both nostrils daily.    Dispense:  16 g    Refill:  2   traMADol (ULTRAM) 50 MG tablet    Sig: ONE TAB PO BID PRN FOR PAIN AND HEADCAHES    Dispense:  20 tablet    Refill:  3   ibuprofen (ADVIL) 600 MG tablet    Sig: ONE TAB PO BID PRN FOR PAIN AND HEADACHES    Dispense:  30 tablet    Refill:  3   acetaminophen (TYLENOL) 500 MG tablet    Sig: 2 TABS PO BID PRN FOR HEADACHES AND PAINS    Dispense:  45 tablet    Refill:  3   omeprazole (PRILOSEC) 40 MG capsule    Sig: Take 1 capsule (40 mg total) by mouth 2 (two) times daily.    Dispense:  60 capsule    Refill:  6     Return in about 3 months (around 12/25/2020) for  , CPE, Billyjack Trompeter PCP already scheduled.   Total time spent:30 Minutes Time spent includes review of chart,  medications, test results, and follow up plan with the patient.   [place] Controlled Substance Database was reviewed by me.  This patient was seen by Jonetta Osgood, FNP-C in collaboration with Dr. Clayborn Bigness as a part of collaborative care  agreement.   Kimika Streater R. Valetta Fuller, MSN, FNP-C Internal medicine

## 2020-09-24 NOTE — Telephone Encounter (Signed)
Please review and fill if appropriate today at office visit at 2 pm

## 2020-09-25 ENCOUNTER — Encounter: Payer: Self-pay | Admitting: Psychiatry

## 2020-09-25 ENCOUNTER — Telehealth (INDEPENDENT_AMBULATORY_CARE_PROVIDER_SITE_OTHER): Payer: Medicare Other | Admitting: Psychiatry

## 2020-09-25 DIAGNOSIS — F3342 Major depressive disorder, recurrent, in full remission: Secondary | ICD-10-CM | POA: Diagnosis not present

## 2020-09-25 DIAGNOSIS — F4310 Post-traumatic stress disorder, unspecified: Secondary | ICD-10-CM | POA: Diagnosis not present

## 2020-09-25 DIAGNOSIS — F5105 Insomnia due to other mental disorder: Secondary | ICD-10-CM

## 2020-09-25 MED ORDER — TRAZODONE HCL 50 MG PO TABS: 25.0000 mg | ORAL_TABLET | Freq: Every day | ORAL | 0 refills | Status: DC

## 2020-09-25 MED ORDER — BUPROPION HCL 75 MG PO TABS: 75.0000 mg | ORAL_TABLET | Freq: Every day | ORAL | 0 refills | Status: DC

## 2020-09-25 NOTE — Progress Notes (Signed)
Virtual Visit via Video Note  I connected with Teresa Crawford on 09/25/20 at 10:00 AM EDT by a video enabled telemedicine application and verified that I am speaking with the correct person using two identifiers.  Location Provider Location : [facility] Patient Location : Home  Participants: Patient , Provider    I discussed the limitations of evaluation and management by telemedicine and the availability of in person appointments. The patient expressed understanding and agreed to proceed.    I discussed the assessment and treatment plan with the patient. The patient was provided an opportunity to ask questions and all were answered. The patient agreed with the plan and demonstrated an understanding of the instructions.   The patient was advised to call back or seek an in-person evaluation if the symptoms worsen or if the condition fails to improve as anticipated.   [facility] MD OP Progress Note  09/25/2020 10:27 AM Teresa Crawford  MRN:  [id number]  Chief Complaint:  Chief Complaint   Follow-up; Depression; Anxiety    HPI: Teresa Crawford is a 35 year old Caucasian female who is single, lives in [place], has a history of MDD, PTSD, insomnia, bereavement was evaluated by telemedicine today.  Patient today reports she is currently doing well.  Denies any significant depression.  Denies any anxiety or nervousness.  She reports sleep is good.  She reports she is currently working on herself, journaling, following up with her therapist.  She reports work is going well.  Denies side effects to medications.  Patient denies any suicidality, homicidality or perceptual disturbances.  Patient denies any other concerns today.  Visit Diagnosis:    ICD-10-CM   1. MDD (major depressive disorder), recurrent, in full remission ([place])  F33.42 buPROPion (WELLBUTRIN) 75 MG tablet    2. Posttraumatic stress disorder  F43.10     3. Insomnia due to mental condition  F51.05 traZODone (DESYREL) 50  MG tablet   depression      Past Psychiatric History: I have reviewed past psychiatric history from progress note on 08/16/2018  Past Medical History:  Past Medical History:  Diagnosis Date   B12 deficiency anemia    Chronic back pain    Chronic fatigue syndrome    Constipation    Depression    Elevated liver enzymes    Endometriosis    Fibroids    Hepatic steatosis    Hypothyroid    Last menstrual period (LMP) > 10 days ago 09/12/15   Mental retardation    Migraine    Post traumatic stress disorder    Posttraumatic stress disorder    PUD (peptic ulcer disease)    Scoliosis    Splenomegaly    Uterine fibroid     Past Surgical History:  Procedure Laterality Date   ABDOMINAL HYSTERECTOMY     BACK SURGERY     COLONOSCOPY  04/27/2006   Wohl-colitis, internal hemorrhoids   COLONOSCOPY  02/2013   focal right colitis, left colon biopsy negative, focal active proctitis without chronicity   ESOPHAGOGASTRODUODENOSCOPY  02/2013   multiple peptic & duodenal ulcers, negative h pylori   INTRAUTERINE DEVICE INSERTION  2012   LAPAROSCOPIC ENDOMETRIOSIS FULGURATION  2015   [name]   OVARIAN CYST REMOVAL      Family Psychiatric History: Reviewed family psychiatric history from progress note on 08/16/2018  Family History:  Family History  Problem Relation Age of Onset   Breast cancer Mother 26   COPD Father    Lung cancer Father 39  Dementia Father    Diabetes Sister    Diabetes Brother    Ovarian cancer Paternal Aunt    Colon cancer Maternal Grandfather 33   Liver disease Maternal Aunt        ?etiology   Lymphoma Maternal Grandmother 32   Lung cancer Other 78       maternal great grandfather   Stomach cancer Other 7       maternal great grandmother    Social History: Reviewed social history from progress note on 08/16/2018 Social History   Socioeconomic History   Marital status: Single    Spouse name: Not on file   Number of children: 0   Years of education: Not on  file   Highest education level: Not on file  Occupational History   Not on file  Tobacco Use   Smoking status: Never   Smokeless tobacco: Never  Vaping Use   Vaping Use: Never used  Substance and Sexual Activity   Alcohol use: Not Currently    Alcohol/week: 0.0 standard drinks   Drug use: No   Sexual activity: Not Currently  Other Topics Concern   Not on file  Social History Narrative   She has brother, half brother & sister live nearby   Both parents deceased   Care managed by [name]   Social Determinants of Health   Financial Resource Strain: Not on file  Food Insecurity: Not on file  Transportation Needs: Not on file  Physical Activity: Not on file  Stress: Not on file  Social Connections: Not on file    Allergies:  Allergies  Allergen Reactions   Other Anaphylaxis    Peanut butter, uses Epi pen   Peanut Butter Flavor Swelling    Throat swells Throat swells   Sertraline Other (See Comments)    Panic attacks   Codeine Nausea And Vomiting    nausea   Paroxetine Other (See Comments)    Panic Attack    Paroxetine Hcl Other (See Comments)    Panic attack Other reaction(s): Other (See Comments) Panic attacks   Venlafaxine Other (See Comments)    Panic Attack    Zoloft [Sertraline Hcl] Other (See Comments)    Panic attack    Metabolic Disorder Labs: Lab Results  Component Value Date   HGBA1C 5.5 12/20/2019   No results found for: PROLACTIN Lab Results  Component Value Date   CHOL 169 12/20/2019   TRIG 117 12/20/2019   HDL 34 (L) 12/20/2019   LDLCALC 114 (H) 12/20/2019   LDLCALC 100 (H) 12/09/2018   Lab Results  Component Value Date   TSH 3.820 12/20/2019   TSH 2.740 12/09/2018    Therapeutic Level Labs: No results found for: LITHIUM No results found for: VALPROATE No components found for:  CBMZ  Current Medications: Current Outpatient Medications  Medication Sig Dispense Refill   acetaminophen (TYLENOL) 500 MG tablet 2 TABS PO BID  PRN FOR HEADACHES AND PAINS 45 tablet 3   albuterol (PROAIR HFA) 108 (90 Base) MCG/ACT inhaler Inhale 2 puffs into the lungs every 6 (six) hours as needed for wheezing or shortness of breath. 1 Inhaler 3   cetirizine (ZYRTEC) 10 MG tablet TAKE 1 TABLET BY MOUTH AT BEDTIME FOR ALLERGIES 30 tablet 3   docusate sodium (DOK) 100 MG capsule TAKE 1 CAPSULE BY MOUTH TWICE DAILY FOR STOOL SOFTENER 60 capsule 3   fluticasone (FLONASE) 50 MCG/ACT nasal spray USE 1 SPRAY INTO EACH NOSTRIL ONCE DAILYFOR CONGESTION OR ALLERGIES  16 g 2   fluticasone (FLOVENT HFA) 220 MCG/ACT inhaler INHALE 2 PUFFS INTO THE LUNGS, 2 TIMES PER DAY. RINSE MOUTH AFTER INHALATIONS 12 g 11   ibuprofen (ADVIL) 600 MG tablet ONE TAB PO BID PRN FOR PAIN AND HEADACHES 30 tablet 3   omeprazole (PRILOSEC) 40 MG capsule Take 1 capsule (40 mg total) by mouth 2 (two) times daily. 60 capsule 6   topiramate (TOPAMAX) 50 MG tablet TAKE 1 TABLET BY MOUTH EVERY EVENING 30 tablet 3   traMADol (ULTRAM) 50 MG tablet ONE TAB PO BID PRN FOR PAIN AND HEADCAHES 20 tablet 3   Acetaminophen-Codeine 300-30 MG tablet acetaminophen 300 mg-codeine 30 mg tablet (Patient not taking: Reported on 09/25/2020)     buPROPion (WELLBUTRIN) 75 MG tablet Take 1 tablet (75 mg total) by mouth daily with breakfast. 90 tablet 0   cephALEXin (KEFLEX) 500 MG capsule cephalexin 500 mg capsule (Patient not taking: Reported on 09/25/2020)     EPINEPHrine 0.3 mg/0.3 mL IJ SOAJ injection USE FOR ANAPHYLAXIS (Patient not taking: Reported on 09/25/2020) 1 each 5   oxyCODONE-acetaminophen (PERCOCET/ROXICET) 5-325 MG tablet oxycodone-acetaminophen 5 mg-325 mg tablet (Patient not taking: Reported on 09/25/2020)     promethazine (PHENERGAN) 12.5 MG tablet Take 1 tablet (12.5 mg total) by mouth every 8 (eight) hours as needed for nausea or vomiting. (Patient not taking: Reported on 09/25/2020) 20 tablet 0   tamsulosin (FLOMAX) 0.4 MG CAPS capsule tamsulosin 0.4 mg capsule (Patient not taking:  Reported on 09/25/2020)     traZODone (DESYREL) 50 MG tablet Take 0.5 tablets (25 mg total) by mouth at bedtime. 45 tablet 0   No current facility-administered medications for this visit.     Musculoskeletal: Strength & Muscle Tone:  UTA Gait & Station:  UTA Patient leans: N/A  Psychiatric Specialty Exam: Review of Systems  Psychiatric/Behavioral: Negative.    All other systems reviewed and are negative.  Last menstrual period 01/05/2016.There is no height or weight on file to calculate BMI.  General Appearance: Casual  Eye Contact:  Good  Speech:  Clear and Coherent  Volume:  Normal  Mood:  Euthymic  Affect:  Congruent  Thought Process:  Goal Directed and Descriptions of Associations: Intact  Orientation:  Full (Time, Place, and Person)  Thought Content: Logical   Suicidal Thoughts:  No  Homicidal Thoughts:  No  Memory:  Immediate;   Fair Recent;   Fair Remote;   Fair  Judgement:  Fair  Insight:  Fair  Psychomotor Activity:  Normal  Concentration:  Concentration: Fair and Attention Span: Fair  Recall:  [employer] of Knowledge: Fair  Language: Fair  Akathisia:  No  Handed:  Right  AIMS (if indicated): not done  Assets:  Communication Skills Desire for Improvement Physical Health Resilience Social Support Talents/Skills Transportation  ADL's:  Intact  Cognition: WNL  Sleep:  Fair   Screenings: Mini-Mental    Flowsheet Row Clinical Support from 12/19/2019 in [facility], [facility] Office Visit from 10/13/2017 in [facility], [facility]  Total Score (max 30 points ) 30 27      PHQ2-9    Flowsheet Row Video Visit from 09/25/2020 in [facility] from 09/18/2020 in [facility] from 08/20/2020 in [facility] from 07/23/2020 in [facility] Video Visit from 06/19/2020 in [facility]   PHQ-2 Total Score 0 0 2 0 0  PHQ-9 Total Score -- -- 11 -- --  Flowsheet Row Counselor from 09/18/2020 in [facility] from 08/20/2020 in [facility] Counselor from 07/23/2020 in [facility] No Risk No Risk No Risk        Assessment and Plan: ELISHIA KACZOROWSKI is a 35 year old Caucasian female, single, currently works part-time on [employer], has a history of MDD, PTSD, insomnia was evaluated by telemedicine today.  Patient is currently stable on current medication regimen.  Plan  MDD in remission Wellbutrin 75 mg p.o. daily Trazodone 25 mg p.o. nightly-reduced dosage Continue CBT with Ms. Christina Hussami  PTSD-stable Continue CBT  Insomnia-improving Trazodone 25 mg p.o. nightly.  Follow-up in clinic in 2 months or sooner if needed.  This note was generated in part or whole with voice recognition software. Voice recognition is usually quite accurate but there are transcription errors that can and very often do occur. I apologize for any typographical errors that were not detected and corrected.    Ursula Alert, MD 09/26/2020, 5:42 PM

## 2020-09-29 MED ORDER — TRAMADOL HCL 50 MG PO TABS: ORAL_TABLET | ORAL | 3 refills | Status: DC

## 2020-09-29 MED ORDER — ACETAMINOPHEN 500 MG PO TABS: ORAL_TABLET | ORAL | 3 refills | Status: DC

## 2020-09-29 MED ORDER — FLUTICASONE PROPIONATE 50 MCG/ACT NA SUSP: 2.0000 | Freq: Every day | NASAL | 2 refills | Status: DC

## 2020-09-29 MED ORDER — OMEPRAZOLE 40 MG PO CPDR: 40.0000 mg | DELAYED_RELEASE_CAPSULE | Freq: Two times a day (BID) | ORAL | 6 refills | Status: DC

## 2020-09-29 MED ORDER — IBUPROFEN 600 MG PO TABS: ORAL_TABLET | ORAL | 3 refills | Status: DC

## 2020-10-01 DIAGNOSIS — S6702XA Crushing injury of left thumb, initial encounter: Secondary | ICD-10-CM | POA: Diagnosis not present

## 2020-10-03 ENCOUNTER — Telehealth: Payer: Self-pay

## 2020-10-03 NOTE — Telephone Encounter (Signed)
pt called states that she needs a D/C order for the hydroxyzine faxed to [facility] at [employer] [phone]

## 2020-10-03 NOTE — Telephone Encounter (Signed)
Letter stating pt no longer taking atarax was sent to requested number

## 2020-10-30 ENCOUNTER — Other Ambulatory Visit: Payer: Self-pay

## 2020-10-30 ENCOUNTER — Ambulatory Visit (INDEPENDENT_AMBULATORY_CARE_PROVIDER_SITE_OTHER): Payer: Medicare Other | Admitting: Licensed Clinical Social Worker

## 2020-10-30 DIAGNOSIS — F4310 Post-traumatic stress disorder, unspecified: Secondary | ICD-10-CM | POA: Diagnosis not present

## 2020-10-30 NOTE — Progress Notes (Signed)
Virtual Visit via Video Note  I connected with Teresa Crawford on 10/30/20 at  1:00 PM EDT by a video enabled telemedicine application and verified that I am speaking with the correct person using two identifiers.  Location: Patient: home Provider: [facility]   I discussed the limitations of evaluation and management by telemedicine and the availability of in person appointments. The patient expressed understanding and agreed to proceed.   I discussed the assessment and treatment plan with the patient. The patient was provided an opportunity to ask questions and all were answered. The patient agreed with the plan and demonstrated an understanding of the instructions.   The patient was advised to call back or seek an in-person evaluation if the symptoms worsen or if the condition fails to improve as anticipated.  I provided 60 minutes of non-face-to-face time during this encounter.   Hamna Asa R Azucena Dart, LCSW   THERAPIST PROGRESS NOTE  Session Time: 1-2p  Participation Level: Active  Behavioral Response: Neat and Well GroomedAlertAnxious  Type of Therapy: Individual Therapy  Treatment Goals addressed: Anxiety  Interventions: Systems analyst and Other: trauma focused  Summary: Teresa Crawford is a 35 y.o. female who presents with improving symptoms related to depression and anxiety diagnosis. Patient reports overall mood is stable, and that she is managing stress and anxiety well. Patient reports good quality and quantity of sleep period allowed patients a space to explore and express thoughts and feelings associated with recent external stressors. Patient reports that she recently had a breakup, but feels that her ex-boyfriend is now trying to communicate with her through an unknown phone number. Patient reports that this ex-boyfriend is now saying that he wants to get back together with her, which patient is happy about. Patient reports that she has mixed feelings because she  recently got involved with another gentleman that she met online. Patient says that she loves the new person that she met online, but is also interested in pursuing a relationship with ex-boyfriend if this situation is legit. Patient reports that work related stress has decreased since last session, and that she is managing work related relationships well. Allow patient to explore current relationships and ex relationships with boyfriends. Patient process through pros and cons of each relationship.Continued recommendations are as follows: self care behaviors, positive social engagements, focusing on overall work/home/life balance, and focusing on positive physical and emotional wellness.  .   Suicidal/Homicidal: No  Therapist Response: Teresa Crawford is reporting that her mood is elevated and that she is able to engage in activities and socialize on a regular basis. Teresa Crawford is trying hard to replace negative self defeating talk with more realistic and positive cognitive messages. Teresa Crawford is continuing to develop effective coping skills to carry out normal responsibilities and participate constructively in healthy relationships. These behaviors are reflective of both personal growth and progress. Treatment to continue as indicated.  Plan: Return again in 4 weeks.  Diagnosis: Axis I: Post Traumatic Stress Disorder    Axis II: No diagnosis    Rachel Bo Shawntee Mainwaring, LCSW 10/30/2020

## 2020-10-31 ENCOUNTER — Ambulatory Visit: Payer: Medicare Other | Admitting: Licensed Clinical Social Worker

## 2020-12-03 ENCOUNTER — Other Ambulatory Visit: Payer: Self-pay

## 2020-12-03 ENCOUNTER — Ambulatory Visit (INDEPENDENT_AMBULATORY_CARE_PROVIDER_SITE_OTHER): Payer: Medicare Other | Admitting: Licensed Clinical Social Worker

## 2020-12-03 DIAGNOSIS — F4310 Post-traumatic stress disorder, unspecified: Secondary | ICD-10-CM

## 2020-12-03 NOTE — Progress Notes (Signed)
Virtual Visit via Video Note  I connected with Teresa Crawford on 12/03/20 at  1:00 PM EDT by a video enabled telemedicine application and verified that I am speaking with the correct person using two identifiers.  Location: Patient: home Provider: remote office [place], [name]   I discussed the limitations of evaluation and management by telemedicine and the availability of in person appointments. The patient expressed understanding and agreed to proceed.  I discussed the assessment and treatment plan with the patient. The patient was provided an opportunity to ask questions and all were answered. The patient agreed with the plan and demonstrated an understanding of the instructions.   The patient was advised to call back or seek an in-person evaluation if the symptoms worsen or if the condition fails to improve as anticipated.  I provided 45 minutes of non-face-to-face time during this encounter.   Teresa Coggin R Ashlin Hidalgo, LCSW   THERAPIST PROGRESS NOTE  Session Time: 1-1:45p  Participation Level: Active  Behavioral Response: Neat and Well GroomedAlertAnxious and Depressed  Type of Therapy: Individual Therapy  Treatment Goals addressed: Anxiety and Diagnosis: Trauma  Interventions: CBT and Other: trauma focused  Summary: Teresa Crawford is a 35 y.o. female who presents with improving symptoms related to PTSD diagnosis. Pt reports that overall mood has been stable and that she is managing external stressors well. Pt reports that she has been experiencing some health-related concerns recently including nausea/vomiting and allergies. Allowed pt safe space to explore and express thoughts and feelings related to recent external stressors and life events. Pt is happy about ongoing new relationship "we have a special connection--he's "the one"".  Continued recommendations are as follows: self care behaviors, positive social engagements, focusing on overall work/home/life balance, and  focusing on positive physical and emotional wellness.   Suicidal/Homicidal: No  Therapist Response: Teresa Crawford is reporting that her mood is elevated and that she is able to engage in activities and socialize on a regular basis. Teresa Crawford is trying hard to replace negative self defeating talk with more realistic and positive cognitive messages. Teresa Crawford is continuing to develop effective coping skills to carry out normal responsibilities and participate constructively in healthy relationships. These behaviors are reflective of both personal growth and progress. Treatment to continue as indicated  Plan: Return again in 4 weeks.  Diagnosis: Axis I: PTSD    Axis II: No diagnosis    [place], LCSW 12/03/2020

## 2020-12-04 ENCOUNTER — Telehealth (INDEPENDENT_AMBULATORY_CARE_PROVIDER_SITE_OTHER): Payer: Medicare Other | Admitting: Psychiatry

## 2020-12-04 ENCOUNTER — Encounter: Payer: Self-pay | Admitting: Psychiatry

## 2020-12-04 DIAGNOSIS — F3342 Major depressive disorder, recurrent, in full remission: Secondary | ICD-10-CM | POA: Diagnosis not present

## 2020-12-04 DIAGNOSIS — F4310 Post-traumatic stress disorder, unspecified: Secondary | ICD-10-CM | POA: Diagnosis not present

## 2020-12-04 DIAGNOSIS — F5105 Insomnia due to other mental disorder: Secondary | ICD-10-CM

## 2020-12-04 MED ORDER — TRAZODONE HCL 50 MG PO TABS: 25.0000 mg | ORAL_TABLET | Freq: Every day | ORAL | 0 refills | Status: DC

## 2020-12-04 MED ORDER — BUPROPION HCL 75 MG PO TABS: 75.0000 mg | ORAL_TABLET | Freq: Every day | ORAL | 0 refills | Status: DC

## 2020-12-04 NOTE — Progress Notes (Signed)
Virtual Visit via Video Note  I connected with Teresa Crawford on 12/04/20 at  2:20 PM EDT by a video enabled telemedicine application and verified that I am speaking with the correct person using two identifiers.  Location Provider Location : [facility] Patient Location : Home  Participants: Patient , Provider    I discussed the limitations of evaluation and management by telemedicine and the availability of in person appointments. The patient expressed understanding and agreed to proceed.    I discussed the assessment and treatment plan with the patient. The patient was provided an opportunity to ask questions and all were answered. The patient agreed with the plan and demonstrated an understanding of the instructions.   The patient was advised to call back or seek an in-person evaluation if the symptoms worsen or if the condition fails to improve as anticipated.   [place] MD OP Progress Note  12/04/2020 2:31 PM Teresa Crawford  MRN:  [id number]  Chief Complaint:  Chief Complaint   Follow-up; Depression    HPI: Teresa Crawford is a 35 year old Caucasian female who is single, lives in [place], has a history of MDD, PTSD, insomnia, bereavement was evaluated by telemedicine today.  Patient reports overall she has been doing well with regards to her mood.  Denies any significant mood swings.  She continues to work and enjoys working.  She also has been spending every other weekend with her sister who currently lives close to her home.  She reports sleep is good.  She denies any suicidality, homicidality or perceptual disturbances.  Patient denies side effects to medications.  Reports psychotherapy sessions is beneficial since she has learned how to manage her anxiety well.  She denies any other concerns today.    Visit Diagnosis:    ICD-10-CM   1. MDD (major depressive disorder), recurrent, in full remission ([place])  F33.42 buPROPion (WELLBUTRIN) 75 MG tablet    2.  Posttraumatic stress disorder  F43.10     3. Insomnia due to mental condition  F51.05 traZODone (DESYREL) 50 MG tablet   depression      Past Psychiatric History: Reviewed past psychiatric history from progress note on 08/16/2018  Past Medical History:  Past Medical History:  Diagnosis Date   B12 deficiency anemia    Chronic back pain    Chronic fatigue syndrome    Constipation    Depression    Elevated liver enzymes    Endometriosis    Fibroids    Hepatic steatosis    Hypothyroid    Last menstrual period (LMP) > 10 days ago 09/12/15   Mental retardation    Migraine    Post traumatic stress disorder    Posttraumatic stress disorder    PUD (peptic ulcer disease)    Scoliosis    Splenomegaly    Uterine fibroid     Past Surgical History:  Procedure Laterality Date   ABDOMINAL HYSTERECTOMY     BACK SURGERY     COLONOSCOPY  04/27/2006   Wohl-colitis, internal hemorrhoids   COLONOSCOPY  02/2013   focal right colitis, left colon biopsy negative, focal active proctitis without chronicity   ESOPHAGOGASTRODUODENOSCOPY  02/2013   multiple peptic & duodenal ulcers, negative h pylori   INTRAUTERINE DEVICE INSERTION  2012   LAPAROSCOPIC ENDOMETRIOSIS FULGURATION  2015   [name]   OVARIAN CYST REMOVAL      Family Psychiatric History: Reviewed family psychiatric history from progress note on 08/16/2018  Family History:  Family History  Problem Relation  Age of Onset   Breast cancer Mother 66   COPD Father    Lung cancer Father 36   Dementia Father    Diabetes Sister    Diabetes Brother    Ovarian cancer Paternal Aunt    Colon cancer Maternal Grandfather 49   Liver disease Maternal Aunt        ?etiology   Lymphoma Maternal Grandmother 40   Lung cancer Other 72       maternal great grandfather   Stomach cancer Other 61       maternal great grandmother    Social History: Reviewed social history from progress note on 08/16/2018 Social History   Socioeconomic History    Marital status: Single    Spouse name: Not on file   Number of children: 0   Years of education: Not on file   Highest education level: Not on file  Occupational History   Not on file  Tobacco Use   Smoking status: Never   Smokeless tobacco: Never  Vaping Use   Vaping Use: Never used  Substance and Sexual Activity   Alcohol use: Not Currently    Alcohol/week: 0.0 standard drinks   Drug use: No   Sexual activity: Not Currently  Other Topics Concern   Not on file  Social History Narrative   She has brother, half brother & sister live nearby   Both parents deceased   Care managed by [name]   Social Determinants of Health   Financial Resource Strain: Not on file  Food Insecurity: Not on file  Transportation Needs: Not on file  Physical Activity: Not on file  Stress: Not on file  Social Connections: Not on file    Allergies:  Allergies  Allergen Reactions   Other Anaphylaxis    Peanut butter, uses Epi pen   Peanut Butter Flavor Swelling    Throat swells Throat swells   Sertraline Other (See Comments)    Panic attacks   Codeine Nausea And Vomiting    nausea   Paroxetine Other (See Comments)    Panic Attack    Paroxetine Hcl Other (See Comments)    Panic attack Other reaction(s): Other (See Comments) Panic attacks   Venlafaxine Other (See Comments)    Panic Attack    Zoloft [Sertraline Hcl] Other (See Comments)    Panic attack    Metabolic Disorder Labs: Lab Results  Component Value Date   HGBA1C 5.5 12/20/2019   No results found for: PROLACTIN Lab Results  Component Value Date   CHOL 169 12/20/2019   TRIG 117 12/20/2019   HDL 34 (L) 12/20/2019   LDLCALC 114 (H) 12/20/2019   LDLCALC 100 (H) 12/09/2018   Lab Results  Component Value Date   TSH 3.820 12/20/2019   TSH 2.740 12/09/2018    Therapeutic Level Labs: No results found for: LITHIUM No results found for: VALPROATE No components found for:  CBMZ  Current Medications: Current  Outpatient Medications  Medication Sig Dispense Refill   acetaminophen (TYLENOL) 500 MG tablet 2 TABS PO BID PRN FOR HEADACHES AND PAINS 45 tablet 3   Acetaminophen-Codeine 300-30 MG tablet acetaminophen 300 mg-codeine 30 mg tablet (Patient not taking: Reported on 09/25/2020)     albuterol (PROAIR HFA) 108 (90 Base) MCG/ACT inhaler Inhale 2 puffs into the lungs every 6 (six) hours as needed for wheezing or shortness of breath. 1 Inhaler 3   buPROPion (WELLBUTRIN) 75 MG tablet Take 1 tablet (75 mg total) by mouth daily with  breakfast. 90 tablet 0   cephALEXin (KEFLEX) 500 MG capsule cephalexin 500 mg capsule (Patient not taking: Reported on 09/25/2020)     cetirizine (ZYRTEC) 10 MG tablet TAKE 1 TABLET BY MOUTH AT BEDTIME FOR ALLERGIES 30 tablet 3   diazepam (VALIUM) 5 MG tablet Take 5 mg by mouth daily as needed.     docusate sodium (DOK) 100 MG capsule TAKE 1 CAPSULE BY MOUTH TWICE DAILY FOR STOOL SOFTENER 60 capsule 3   EPINEPHrine 0.3 mg/0.3 mL IJ SOAJ injection USE FOR ANAPHYLAXIS (Patient not taking: Reported on 09/25/2020) 1 each 5   fluticasone (FLONASE) 50 MCG/ACT nasal spray Place 2 sprays into both nostrils daily. 16 g 2   fluticasone (FLOVENT HFA) 220 MCG/ACT inhaler INHALE 2 PUFFS INTO THE LUNGS, 2 TIMES PER DAY. RINSE MOUTH AFTER INHALATIONS 12 g 11   ibuprofen (ADVIL) 600 MG tablet ONE TAB PO BID PRN FOR PAIN AND HEADACHES 30 tablet 3   omeprazole (PRILOSEC) 40 MG capsule Take 1 capsule (40 mg total) by mouth 2 (two) times daily. 60 capsule 6   oxyCODONE-acetaminophen (PERCOCET/ROXICET) 5-325 MG tablet oxycodone-acetaminophen 5 mg-325 mg tablet (Patient not taking: Reported on 09/25/2020)     promethazine (PHENERGAN) 12.5 MG tablet Take 1 tablet (12.5 mg total) by mouth every 8 (eight) hours as needed for nausea or vomiting. (Patient not taking: Reported on 09/25/2020) 20 tablet 0   tamsulosin (FLOMAX) 0.4 MG CAPS capsule tamsulosin 0.4 mg capsule (Patient not taking: Reported on 09/25/2020)      topiramate (TOPAMAX) 50 MG tablet TAKE 1 TABLET BY MOUTH EVERY EVENING 30 tablet 3   traMADol (ULTRAM) 50 MG tablet ONE TAB PO BID PRN FOR PAIN AND HEADCAHES 20 tablet 3   traZODone (DESYREL) 50 MG tablet Take 0.5 tablets (25 mg total) by mouth at bedtime. 45 tablet 0   No current facility-administered medications for this visit.     Musculoskeletal: Strength & Muscle Tone:  UTA Gait & Station:  Seated Patient leans: N/A  Psychiatric Specialty Exam: Review of Systems  Psychiatric/Behavioral:  Negative for agitation, behavioral problems, confusion, decreased concentration, dysphoric mood, self-injury, sleep disturbance and suicidal ideas. The patient is not nervous/anxious and is not hyperactive.   All other systems reviewed and are negative.  Last menstrual period 01/05/2016.There is no height or weight on file to calculate BMI.  General Appearance: Casual  Eye Contact:  Minimal  Speech:  Clear and Coherent  Volume:  Normal  Mood:  Euthymic  Affect:  Congruent  Thought Process:  Goal Directed and Descriptions of Associations: Intact  Orientation:  Full (Time, Place, and Person)  Thought Content: Logical   Suicidal Thoughts:  No  Homicidal Thoughts:  No  Memory:  Immediate;   Fair Recent;   Fair Remote;   Fair  Judgement:  Fair  Insight:  Fair  Psychomotor Activity:  Normal  Concentration:  Concentration: Fair and Attention Span: Fair  Recall:  [employer] of Knowledge: Fair  Language: Fair  Akathisia:  No  Handed:  Right  AIMS (if indicated): done  Assets:  Communication Skills Desire for [place] Talents/Skills Transportation Vocational/Educational  ADL's:  Intact  Cognition: WNL  Sleep:  Fair   Screenings: Mini-Mental    Flowsheet Row Clinical Support from 12/19/2019 in [facility], [facility] Office Visit from 10/13/2017 in [facility], [facility]  Total Score (max 30 points ) 30 27      PHQ2-9    Flowsheet Row  Video  Visit from 09/25/2020 in [facility] from 09/18/2020 in [place] from 08/20/2020 in [facility] from 07/23/2020 in [facility] Video Visit from 06/19/2020 in [facility] [place]  PHQ-2 Total Score 0 0 2 0 0  PHQ-9 Total Score -- -- 11 -- --      Flowsheet Row Counselor from 10/30/2020 in [place] Counselor from 09/18/2020 in [place] Counselor from 08/20/2020 in [place] No Risk No Risk No Risk        Assessment and Plan: Teresa Crawford is a 35 year old Caucasian female who has a history of MDD, PTSD, insomnia, single, currently works part-time, on SSD was evaluated by telemedicine today.  Patient is currently stable.  Plan MDD in remission Wellbutrin 75 mg p.o. daily Trazodone 25 mg p.o. nightly-reduced dosage Continue CBT with Teresa Crawford AIMS - 0  PTSD-stable Continue CBT  Insomnia-stable Trazodone 25 mg p.o. nightly  Follow-up in clinic in 2 months or sooner if needed.  This note was generated in part or whole with voice recognition software. Voice recognition is usually quite accurate but there are transcription errors that can and very often do occur. I apologize for any typographical errors that were not detected and corrected.       Ursula Alert, MD 12/05/2020, 1:01 PM

## 2020-12-11 ENCOUNTER — Ambulatory Visit: Payer: Medicare Other | Admitting: Nurse Practitioner

## 2020-12-18 ENCOUNTER — Other Ambulatory Visit: Payer: Self-pay | Admitting: Nurse Practitioner

## 2020-12-19 ENCOUNTER — Ambulatory Visit: Payer: Medicare Other | Admitting: Internal Medicine

## 2020-12-25 ENCOUNTER — Ambulatory Visit (INDEPENDENT_AMBULATORY_CARE_PROVIDER_SITE_OTHER): Payer: Medicare Other | Admitting: Nurse Practitioner

## 2020-12-25 ENCOUNTER — Encounter: Payer: Self-pay | Admitting: Nurse Practitioner

## 2020-12-25 ENCOUNTER — Other Ambulatory Visit: Payer: Self-pay

## 2020-12-25 VITALS — BP 118/65 | HR 75 | Temp 98.0°F | Resp 16 | Ht 66.0 in | Wt 195.6 lb

## 2020-12-25 DIAGNOSIS — R569 Unspecified convulsions: Secondary | ICD-10-CM

## 2020-12-25 DIAGNOSIS — J4520 Mild intermittent asthma, uncomplicated: Secondary | ICD-10-CM | POA: Diagnosis not present

## 2020-12-25 DIAGNOSIS — K5903 Drug induced constipation: Secondary | ICD-10-CM

## 2020-12-25 DIAGNOSIS — Z87892 Personal history of anaphylaxis: Secondary | ICD-10-CM | POA: Diagnosis not present

## 2020-12-25 DIAGNOSIS — E559 Vitamin D deficiency, unspecified: Secondary | ICD-10-CM | POA: Diagnosis not present

## 2020-12-25 DIAGNOSIS — J301 Allergic rhinitis due to pollen: Secondary | ICD-10-CM

## 2020-12-25 DIAGNOSIS — G43009 Migraine without aura, not intractable, without status migrainosus: Secondary | ICD-10-CM

## 2020-12-25 DIAGNOSIS — R300 Dysuria: Secondary | ICD-10-CM

## 2020-12-25 DIAGNOSIS — Z1231 Encounter for screening mammogram for malignant neoplasm of breast: Secondary | ICD-10-CM | POA: Diagnosis not present

## 2020-12-25 DIAGNOSIS — E782 Mixed hyperlipidemia: Secondary | ICD-10-CM | POA: Diagnosis not present

## 2020-12-25 DIAGNOSIS — Z0001 Encounter for general adult medical examination with abnormal findings: Secondary | ICD-10-CM | POA: Diagnosis not present

## 2020-12-25 DIAGNOSIS — Z23 Encounter for immunization: Secondary | ICD-10-CM | POA: Diagnosis not present

## 2020-12-25 DIAGNOSIS — E039 Hypothyroidism, unspecified: Secondary | ICD-10-CM | POA: Diagnosis not present

## 2020-12-25 MED ORDER — TOPIRAMATE 50 MG PO TABS: 50.0000 mg | ORAL_TABLET | Freq: Every evening | ORAL | 5 refills | Status: DC

## 2020-12-25 MED ORDER — ALBUTEROL SULFATE HFA 108 (90 BASE) MCG/ACT IN AERS: 2.0000 | INHALATION_SPRAY | Freq: Four times a day (QID) | RESPIRATORY_TRACT | 3 refills | Status: DC | PRN

## 2020-12-25 MED ORDER — FLUTICASONE PROPIONATE 50 MCG/ACT NA SUSP: 2.0000 | Freq: Every day | NASAL | 5 refills | Status: DC

## 2020-12-25 MED ORDER — OMEPRAZOLE 40 MG PO CPDR: 40.0000 mg | DELAYED_RELEASE_CAPSULE | Freq: Two times a day (BID) | ORAL | 5 refills | Status: DC

## 2020-12-25 MED ORDER — EPINEPHRINE 0.3 MG/0.3ML IJ SOAJ: INTRAMUSCULAR | 5 refills | Status: DC

## 2020-12-25 MED ORDER — FLUTICASONE PROPIONATE HFA 220 MCG/ACT IN AERO: INHALATION_SPRAY | RESPIRATORY_TRACT | 11 refills | Status: DC

## 2020-12-25 MED ORDER — ACETAMINOPHEN 500 MG PO TABS: ORAL_TABLET | ORAL | 5 refills | Status: DC

## 2020-12-25 MED ORDER — CETIRIZINE HCL 10 MG PO TABS: ORAL_TABLET | ORAL | 5 refills | Status: DC

## 2020-12-25 MED ORDER — DOCUSATE SODIUM 100 MG PO CAPS: ORAL_CAPSULE | ORAL | 5 refills | Status: DC

## 2020-12-25 MED ORDER — IBUPROFEN 600 MG PO TABS: ORAL_TABLET | ORAL | 5 refills | Status: DC

## 2020-12-25 MED ORDER — TRAMADOL HCL 50 MG PO TABS: ORAL_TABLET | ORAL | 3 refills | Status: DC

## 2020-12-25 NOTE — Progress Notes (Signed)
[facility] [place], [place] [address]  Internal MEDICINE  Office Visit Note  Patient Name: Teresa Crawford  [id number]  [id number]  Date of Service: 12/25/2020  Chief Complaint  Patient presents with   Medicare Wellness    HPI Jeannine presents for an annual well visit and physical exam. She has a history of anemia, anxiety, depression, migraines, PTSD, chronic fatigue syndrome, chronic back pain, mentally disabled, and hypothyroidism. She is a nonsmoker, denies alcohol use and denies recreation drug use. She is accompanied by staff from the facility she lives at. She has had 3 doses of the covid vaccine.  -age appropriate screenings and immunizations as appropriate. She is not currently due for any preventive screenings. She denies any pain. She is requesting the flu vaccine. She has no other questions or concerns.     Current Medication: Outpatient Encounter Medications as of 12/25/2020  Medication Sig   buPROPion (WELLBUTRIN) 75 MG tablet Take 1 tablet (75 mg total) by mouth daily with breakfast.   promethazine (PHENERGAN) 12.5 MG tablet Take 1 tablet (12.5 mg total) by mouth every 8 (eight) hours as needed for nausea or vomiting.   tamsulosin (FLOMAX) 0.4 MG CAPS capsule    traZODone (DESYREL) 50 MG tablet Take 50 mg by mouth at bedtime.   [DISCONTINUED] acetaminophen (TYLENOL) 500 MG tablet 2 TABS PO BID PRN FOR HEADACHES AND PAINS   [DISCONTINUED] albuterol (PROAIR HFA) 108 (90 Base) MCG/ACT inhaler Inhale 2 puffs into the lungs every 6 (six) hours as needed for wheezing or shortness of breath.   [DISCONTINUED] cetirizine (ZYRTEC) 10 MG tablet TAKE 1 TABLET BY MOUTH AT BEDTIME FOR ALLERGIES   [DISCONTINUED] docusate sodium (DOK) 100 MG capsule TAKE 1 CAPSULE BY MOUTH TWICE DAILY FOR STOOL SOFTENER   [DISCONTINUED] EPINEPHrine 0.3 mg/0.3 mL IJ SOAJ injection USE FOR ANAPHYLAXIS   [DISCONTINUED] fluticasone (FLONASE) 50 MCG/ACT nasal spray Place 2 sprays  into both nostrils daily.   [DISCONTINUED] fluticasone (FLOVENT HFA) 220 MCG/ACT inhaler INHALE 2 PUFFS INTO THE LUNGS, 2 TIMES PER DAY. RINSE MOUTH AFTER INHALATIONS   [DISCONTINUED] ibuprofen (ADVIL) 600 MG tablet ONE TAB PO BID PRN FOR PAIN AND HEADACHES   [DISCONTINUED] omeprazole (PRILOSEC) 40 MG capsule Take 1 capsule (40 mg total) by mouth 2 (two) times daily.   [DISCONTINUED] topiramate (TOPAMAX) 50 MG tablet TAKE 1 TABLET BY MOUTH EVERY EVENING   [DISCONTINUED] traMADol (ULTRAM) 50 MG tablet ONE TAB PO BID PRN FOR PAIN AND HEADCAHES   acetaminophen (TYLENOL) 500 MG tablet 2 TABS PO BID PRN FOR HEADACHES AND PAINS   albuterol (PROAIR HFA) 108 (90 Base) MCG/ACT inhaler Inhale 2 puffs into the lungs every 6 (six) hours as needed for wheezing or shortness of breath.   cetirizine (ZYRTEC) 10 MG tablet TAKE 1 TABLET BY MOUTH AT BEDTIME FOR ALLERGIES   docusate sodium (DOK) 100 MG capsule TAKE 1 CAPSULE BY MOUTH TWICE DAILY FOR STOOL SOFTENER   EPINEPHrine 0.3 mg/0.3 mL IJ SOAJ injection USE FOR ANAPHYLAXIS   fluticasone (FLONASE) 50 MCG/ACT nasal spray Place 2 sprays into both nostrils daily.   fluticasone (FLOVENT HFA) 220 MCG/ACT inhaler INHALE 2 PUFFS INTO THE LUNGS, 2 TIMES PER DAY. RINSE MOUTH AFTER INHALATIONS   ibuprofen (ADVIL) 600 MG tablet ONE TAB PO BID PRN FOR PAIN AND HEADACHES   omeprazole (PRILOSEC) 40 MG capsule Take 1 capsule (40 mg total) by mouth 2 (two) times daily.   topiramate (TOPAMAX) 50 MG tablet Take 1 tablet (  50 mg total) by mouth every evening.   traMADol (ULTRAM) 50 MG tablet ONE TAB PO BID PRN FOR PAIN AND HEADCAHES   [DISCONTINUED] Acetaminophen-Codeine 300-30 MG tablet acetaminophen 300 mg-codeine 30 mg tablet (Patient not taking: Reported on 09/25/2020)   [DISCONTINUED] cephALEXin (KEFLEX) 500 MG capsule cephalexin 500 mg capsule (Patient not taking: No sig reported)   [DISCONTINUED] diazepam (VALIUM) 5 MG tablet Take 5 mg by mouth daily as needed. (Patient not  taking: Reported on 12/25/2020)   [DISCONTINUED] oxyCODONE-acetaminophen (PERCOCET/ROXICET) 5-325 MG tablet oxycodone-acetaminophen 5 mg-325 mg tablet (Patient not taking: Reported on 09/25/2020)   [DISCONTINUED] traZODone (DESYREL) 50 MG tablet Take 0.5 tablets (25 mg total) by mouth at bedtime. (Patient not taking: Reported on 12/25/2020)   No facility-administered encounter medications on file as of 12/25/2020.    Surgical History: Past Surgical History:  Procedure Laterality Date   ABDOMINAL HYSTERECTOMY     BACK SURGERY     COLONOSCOPY  04/27/2006   Wohl-colitis, internal hemorrhoids   COLONOSCOPY  02/2013   focal right colitis, left colon biopsy negative, focal active proctitis without chronicity   ESOPHAGOGASTRODUODENOSCOPY  02/2013   multiple peptic & duodenal ulcers, negative h pylori   INTRAUTERINE DEVICE INSERTION  2012   LAPAROSCOPIC ENDOMETRIOSIS FULGURATION  2015   [name]   OVARIAN CYST REMOVAL      Medical History: Past Medical History:  Diagnosis Date   B12 deficiency anemia    Chronic back pain    Chronic fatigue syndrome    Constipation    Depression    Elevated liver enzymes    Endometriosis    Fibroids    Hepatic steatosis    Hypothyroid    Last menstrual period (LMP) > 10 days ago 09/12/15   Mental retardation    Migraine    Post traumatic stress disorder    Posttraumatic stress disorder    PUD (peptic ulcer disease)    Scoliosis    Splenomegaly    Uterine fibroid     Family History: Family History  Problem Relation Age of Onset   Breast cancer Mother 72   COPD Father    Lung cancer Father 31   Dementia Father    Diabetes Sister    Diabetes Brother    Ovarian cancer Paternal Aunt    Colon cancer Maternal Grandfather 58   Liver disease Maternal Aunt        ?etiology   Lymphoma Maternal Grandmother 53   Lung cancer Other 4       maternal great grandfather   Stomach cancer Other 36       maternal great grandmother    Social History    Socioeconomic History   Marital status: Single    Spouse name: Not on file   Number of children: 0   Years of education: Not on file   Highest education level: Not on file  Occupational History   Not on file  Tobacco Use   Smoking status: Never   Smokeless tobacco: Never  Vaping Use   Vaping Use: Never used  Substance and Sexual Activity   Alcohol use: Not Currently    Alcohol/week: 0.0 standard drinks   Drug use: No   Sexual activity: Not Currently  Other Topics Concern   Not on file  Social History Narrative   She has brother, half brother & sister live nearby   Both parents deceased   Care managed by [name]   Social Determinants of Health   Financial  Resource Strain: Not on file  Food Insecurity: Not on file  Transportation Needs: Not on file  Physical Activity: Not on file  Stress: Not on file  Social Connections: Not on file  Intimate Partner Violence: Not on file      Review of Systems  Constitutional:  Negative for activity change, appetite change, chills, fatigue, fever and unexpected weight change.  HENT: Negative.  Negative for congestion, ear pain, rhinorrhea, sore throat and trouble swallowing.   Eyes: Negative.   Respiratory: Negative.  Negative for cough, chest tightness, shortness of breath and wheezing.   Cardiovascular: Negative.  Negative for chest pain.  Gastrointestinal: Negative.  Negative for abdominal pain, blood in stool, constipation, diarrhea, nausea and vomiting.  Endocrine: Negative.   Genitourinary: Negative.  Negative for difficulty urinating, dysuria, frequency, hematuria and urgency.  Musculoskeletal: Negative.  Negative for arthralgias, back pain, joint swelling, myalgias and neck pain.  Skin: Negative.  Negative for rash and wound.  Allergic/Immunologic: Negative.  Negative for immunocompromised state.  Neurological: Negative.  Negative for dizziness, seizures, numbness and headaches.  Hematological: Negative.    Psychiatric/Behavioral: Negative.  Negative for behavioral problems, self-injury and suicidal ideas. The patient is not nervous/anxious.    Vital Signs: BP 118/65   Pulse 75   Temp 98 F (36.7 C)   Resp 16   Ht 5' 6" (1.676 m)   Wt 195 lb 9.6 oz (88.7 kg)   LMP 01/05/2016   SpO2 99%   BMI 31.57 kg/m    Physical Exam Vitals reviewed.  Constitutional:      General: She is awake. She is not in acute distress.    Appearance: Normal appearance. She is well-developed and well-groomed. She is obese. She is not ill-appearing or diaphoretic.  HENT:     Head: Normocephalic and atraumatic.     Right Ear: Tympanic membrane, ear canal and external ear normal.     Left Ear: Tympanic membrane, ear canal and external ear normal.     Nose: Nose normal. No congestion or rhinorrhea.     Mouth/Throat:     Lips: Pink.     Mouth: Mucous membranes are moist.     Pharynx: Oropharynx is clear. Uvula midline. No oropharyngeal exudate or posterior oropharyngeal erythema.  Eyes:     General: Lids are normal. Vision grossly intact. Gaze aligned appropriately. No scleral icterus.       Right eye: No discharge.        Left eye: No discharge.     Extraocular Movements: Extraocular movements intact.     Conjunctiva/sclera: Conjunctivae normal.     Pupils: Pupils are equal, round, and reactive to light.     Funduscopic exam:    Right eye: Red reflex present.        Left eye: Red reflex present. Neck:     Thyroid: No thyromegaly.     Vascular: No carotid bruit or JVD.     Trachea: Trachea and phonation normal. No tracheal deviation.  Cardiovascular:     Rate and Rhythm: Normal rate and regular rhythm.     Pulses: Normal pulses.     Heart sounds: Normal heart sounds, S1 normal and S2 normal. No murmur heard.   No friction rub. No gallop.  Pulmonary:     Effort: Pulmonary effort is normal. No accessory muscle usage or respiratory distress.     Breath sounds: Normal breath sounds and air entry. No  stridor. No wheezing or rales.  Chest:  Chest wall: No tenderness.  Abdominal:     General: Bowel sounds are normal. There is no distension.     Palpations: Abdomen is soft. There is no shifting dullness, fluid wave, mass or pulsatile mass.     Tenderness: There is no abdominal tenderness. There is no guarding or rebound.  Musculoskeletal:        General: No tenderness or deformity. Normal range of motion.     Cervical back: Normal range of motion and neck supple.     Right lower leg: No edema.     Left lower leg: No edema.  Lymphadenopathy:     Cervical: No cervical adenopathy.  Skin:    General: Skin is warm and dry.     Capillary Refill: Capillary refill takes less than 2 seconds.     Coloration: Skin is not pale.     Findings: No erythema or rash.  Neurological:     Mental Status: She is alert and oriented to person, place, and time.     Cranial Nerves: No cranial nerve deficit.     Motor: No abnormal muscle tone.     Coordination: Coordination normal.     Gait: Gait normal.     Deep Tendon Reflexes: Reflexes are normal and symmetric.  Psychiatric:        Mood and Affect: Mood and affect normal.        Behavior: Behavior normal. Behavior is cooperative.        Thought Content: Thought content normal.        Judgment: Judgment normal.       Assessment/Plan: 1. Encounter for general adult medical examination with abnormal findings Age-appropriate preventive screenings and vaccinations discussed, annual physical exam completed. Routine labs for health maintenance ordered, see below. PHM updated. Mammogram ordered.   2. Mild intermittent asthma without complication Stable, refills ordered - albuterol (PROAIR HFA) 108 (90 Base) MCG/ACT inhaler; Inhale 2 puffs into the lungs every 6 (six) hours as needed for wheezing or shortness of breath.  Dispense: 1 each; Refill: 3 - fluticasone (FLOVENT HFA) 220 MCG/ACT inhaler; INHALE 2 PUFFS INTO THE LUNGS, 2 TIMES PER DAY. RINSE  MOUTH AFTER INHALATIONS  Dispense: 12 g; Refill: 11  3. Allergic rhinitis due to pollen, unspecified seasonality Stable, refills ordered.  - omeprazole (PRILOSEC) 40 MG capsule; Take 1 capsule (40 mg total) by mouth 2 (two) times daily.  Dispense: 60 capsule; Refill: 5 - fluticasone (FLONASE) 50 MCG/ACT nasal spray; Place 2 sprays into both nostrils daily.  Dispense: 16 g; Refill: 5 - cetirizine (ZYRTEC) 10 MG tablet; TAKE 1 TABLET BY MOUTH AT BEDTIME FOR ALLERGIES  Dispense: 30 tablet; Refill: 5  4. Migraine without aura, not intractable, without status migrainosus Medication refills ordered, routine labs ordered - CBC with Differential/Platelet - CMP14+EGFR - topiramate (TOPAMAX) 50 MG tablet; Take 1 tablet (50 mg total) by mouth every evening.  Dispense: 30 tablet; Refill: 5 - traMADol (ULTRAM) 50 MG tablet; ONE TAB PO BID PRN FOR PAIN AND HEADCAHES  Dispense: 20 tablet; Refill: 3 - ibuprofen (ADVIL) 600 MG tablet; ONE TAB PO BID PRN FOR PAIN AND HEADACHES  Dispense: 30 tablet; Refill: 5 - acetaminophen (TYLENOL) 500 MG tablet; 2 TABS PO BID PRN FOR HEADACHES AND PAINS  Dispense: 45 tablet; Refill: 5  5. Acquired hypothyroidism Routine lab ordered.  - TSH + free T4  6. Seizure-like activity (HCC) Stable, refill ordered.  - topiramate (TOPAMAX) 50 MG tablet; Take 1 tablet (50 mg total) by  mouth every evening.  Dispense: 30 tablet; Refill: 5  7. Drug-induced constipation Refill ordred - docusate sodium (DOK) 100 MG capsule; TAKE 1 CAPSULE BY MOUTH TWICE DAILY FOR STOOL SOFTENER  Dispense: 60 capsule; Refill: 5  8. Mixed hyperlipidemia Routine lab ordered - Lipid Profile  9. Vitamin D deficiency Rule out low vitamin D - Vitamin D (25 hydroxy)  10. Personal history of anaphylaxis Refill ordered - EPINEPHrine 0.3 mg/0.3 mL IJ SOAJ injection; USE FOR ANAPHYLAXIS  Dispense: 1 each; Refill: 5  11. Dysuria Routine urinalysis done - UA/M w/rflx Culture, Routine  12. Encounter  for screening mammogram for malignant neoplasm of breast Routine mammogram ordered - MM 3D SCREEN BREAST BILATERAL; Future  13. Needs flu shot Flu vaccine administered in office today - Flu Vaccine MDCK QUAD PF     General Counseling: Raylinn verbalizes understanding of the findings of todays visit and agrees with plan of treatment. I have discussed any further diagnostic evaluation that may be needed or ordered today. We also reviewed her medications today. she has been encouraged to call the office with any questions or concerns that should arise related to todays visit.    Orders Placed This Encounter  Procedures   MM 3D SCREEN BREAST BILATERAL   Flu Vaccine MDCK QUAD PF   CBC with Differential/Platelet   CMP14+EGFR   Lipid Profile   TSH + free T4   Vitamin D (25 hydroxy)   UA/M w/rflx Culture, Routine    Meds ordered this encounter  Medications   omeprazole (PRILOSEC) 40 MG capsule    Sig: Take 1 capsule (40 mg total) by mouth 2 (two) times daily.    Dispense:  60 capsule    Refill:  5   fluticasone (FLONASE) 50 MCG/ACT nasal spray    Sig: Place 2 sprays into both nostrils daily.    Dispense:  16 g    Refill:  5   albuterol (PROAIR HFA) 108 (90 Base) MCG/ACT inhaler    Sig: Inhale 2 puffs into the lungs every 6 (six) hours as needed for wheezing or shortness of breath.    Dispense:  1 each    Refill:  3   topiramate (TOPAMAX) 50 MG tablet    Sig: Take 1 tablet (50 mg total) by mouth every evening.    Dispense:  30 tablet    Refill:  5   traMADol (ULTRAM) 50 MG tablet    Sig: ONE TAB PO BID PRN FOR PAIN AND HEADCAHES    Dispense:  20 tablet    Refill:  3   ibuprofen (ADVIL) 600 MG tablet    Sig: ONE TAB PO BID PRN FOR PAIN AND HEADACHES    Dispense:  30 tablet    Refill:  5   fluticasone (FLOVENT HFA) 220 MCG/ACT inhaler    Sig: INHALE 2 PUFFS INTO THE LUNGS, 2 TIMES PER DAY. RINSE MOUTH AFTER INHALATIONS    Dispense:  12 g    Refill:  11   EPINEPHrine 0.3  mg/0.3 mL IJ SOAJ injection    Sig: USE FOR ANAPHYLAXIS    Dispense:  1 each    Refill:  5    WILL NEED REFILLS AUTHORIZED IF PATIENT IS TO CONTINUE TOHAVE THIS IN AN EMERGENCY.  THANKS   docusate sodium (DOK) 100 MG capsule    Sig: TAKE 1 CAPSULE BY MOUTH TWICE DAILY FOR STOOL SOFTENER    Dispense:  60 capsule    Refill:  5   cetirizine (  ZYRTEC) 10 MG tablet    Sig: TAKE 1 TABLET BY MOUTH AT BEDTIME FOR ALLERGIES    Dispense:  30 tablet    Refill:  5    REFILLS NEEDED FOR FUTURE USE THANKS   acetaminophen (TYLENOL) 500 MG tablet    Sig: 2 TABS PO BID PRN FOR HEADACHES AND PAINS    Dispense:  45 tablet    Refill:  5    Return in about 6 months (around 06/24/2021) for F/U, med refill, Alyssa PCP.   Total time spent:30 Minutes Time spent includes review of chart, medications, test results, and follow up plan with the patient.   [place] Controlled Substance Database was reviewed by me.  This patient was seen by Jonetta Osgood, FNP-C in collaboration with Dr. Clayborn Bigness as a part of collaborative care agreement.  Alyssa R. Valetta Fuller, MSN, FNP-C Internal medicine

## 2020-12-27 DIAGNOSIS — G43009 Migraine without aura, not intractable, without status migrainosus: Secondary | ICD-10-CM | POA: Diagnosis not present

## 2020-12-27 DIAGNOSIS — E782 Mixed hyperlipidemia: Secondary | ICD-10-CM | POA: Diagnosis not present

## 2020-12-27 DIAGNOSIS — E559 Vitamin D deficiency, unspecified: Secondary | ICD-10-CM | POA: Diagnosis not present

## 2020-12-27 DIAGNOSIS — E039 Hypothyroidism, unspecified: Secondary | ICD-10-CM | POA: Diagnosis not present

## 2020-12-28 LAB — CMP14+EGFR
ALT: 12 IU/L (ref 0–32)
AST: 11 IU/L (ref 0–40)
Albumin/Globulin Ratio: 1.6 (ref 1.2–2.2)
Albumin: 4 g/dL (ref 3.8–4.8)
Alkaline Phosphatase: 101 IU/L (ref 44–121)
BUN/Creatinine Ratio: 12 (ref 9–23)
BUN: 8 mg/dL (ref 6–20)
Bilirubin Total: 0.5 mg/dL (ref 0.0–1.2)
CO2: 24 mmol/L (ref 20–29)
Calcium: 9.1 mg/dL (ref 8.7–10.2)
Chloride: 103 mmol/L (ref 96–106)
Creatinine, Ser: 0.66 mg/dL (ref 0.57–1.00)
Globulin, Total: 2.5 g/dL (ref 1.5–4.5)
Glucose: 101 mg/dL — ABNORMAL HIGH (ref 65–99)
Potassium: 4.2 mmol/L (ref 3.5–5.2)
Sodium: 141 mmol/L (ref 134–144)
Total Protein: 6.5 g/dL (ref 6.0–8.5)
eGFR: 117 mL/min/{1.73_m2} (ref >59)

## 2020-12-28 LAB — CBC WITH DIFFERENTIAL/PLATELET
Basophils Absolute: 0 10*3/uL (ref 0.0–0.2)
Basos: 1 %
EOS (ABSOLUTE): 0.1 10*3/uL (ref 0.0–0.4)
Eos: 1 %
Hematocrit: 38.7 % (ref 34.0–46.6)
Hemoglobin: 13.4 g/dL (ref 11.1–15.9)
Immature Grans (Abs): 0 10*3/uL (ref 0.0–0.1)
Immature Granulocytes: 0 %
Lymphocytes Absolute: 1.4 10*3/uL (ref 0.7–3.1)
Lymphs: 34 %
MCH: 31.3 pg (ref 26.6–33.0)
MCHC: 34.6 g/dL (ref 31.5–35.7)
MCV: 90 fL (ref 79–97)
Monocytes Absolute: 0.2 10*3/uL (ref 0.1–0.9)
Monocytes: 6 %
Neutrophils Absolute: 2.5 10*3/uL (ref 1.4–7.0)
Neutrophils: 58 %
Platelets: 173 10*3/uL (ref 150–450)
RBC: 4.28 x10E6/uL (ref 3.77–5.28)
RDW: 14.3 % (ref 11.7–15.4)
WBC: 4.2 10*3/uL (ref 3.4–10.8)

## 2020-12-28 LAB — VITAMIN D 25 HYDROXY (VIT D DEFICIENCY, FRACTURES): Vit D, 25-Hydroxy: 12 ng/mL — ABNORMAL LOW (ref 30.0–100.0)

## 2020-12-28 LAB — TSH+FREE T4
Free T4: 1.07 ng/dL (ref 0.82–1.77)
TSH: 1.75 u[IU]/mL (ref 0.450–4.500)

## 2020-12-28 LAB — LIPID PANEL
Chol/HDL Ratio: 4.3 ratio (ref 0.0–4.4)
Cholesterol, Total: 152 mg/dL (ref 100–199)
HDL: 35 mg/dL — ABNORMAL LOW (ref >39)
LDL Chol Calc (NIH): 99 mg/dL (ref 0–99)
Triglycerides: 93 mg/dL (ref 0–149)
VLDL Cholesterol Cal: 18 mg/dL (ref 5–40)

## 2020-12-30 LAB — UA/M W/RFLX CULTURE, ROUTINE

## 2021-01-06 ENCOUNTER — Telehealth: Payer: Self-pay

## 2021-01-06 NOTE — Telephone Encounter (Signed)
Medicaid program ling term care services form signed by provider and Anne Ng, patient's care taker was advised ready for pickup at the front desk.

## 2021-01-08 ENCOUNTER — Ambulatory Visit (INDEPENDENT_AMBULATORY_CARE_PROVIDER_SITE_OTHER): Payer: Medicare Other | Admitting: Licensed Clinical Social Worker

## 2021-01-08 DIAGNOSIS — Z634 Disappearance and death of family member: Secondary | ICD-10-CM

## 2021-01-08 DIAGNOSIS — F4310 Post-traumatic stress disorder, unspecified: Secondary | ICD-10-CM

## 2021-01-08 NOTE — Progress Notes (Signed)
Virtual Visit via Video Note  I connected with Teresa Crawford on 01/08/21 at  1:00 PM EDT by a video enabled telemedicine application and verified that I am speaking with the correct person using two identifiers.  Location: Patient: home Provider: [facility]   I discussed the limitations of evaluation and management by telemedicine and the availability of in person appointments. The patient expressed understanding and agreed to proceed.   I discussed the assessment and treatment plan with the patient. The patient was provided an opportunity to ask questions and all were answered. The patient agreed with the plan and demonstrated an understanding of the instructions.   The patient was advised to call back or seek an in-person evaluation if the symptoms worsen or if the condition fails to improve as anticipated.  I provided 60 minutes of non-face-to-face time during this encounter.   Jaree Trinka R Keoshia Steinmetz, LCSW   THERAPIST PROGRESS NOTE  Session Time: 1-2p  Participation Level: Active  Behavioral Response: Neat and Well GroomedAlertAnxious  Type of Therapy: Individual Therapy  Treatment Goals addressed:  Problem: Reduce the negative impact trauma related symptoms have on social, occupational, and family functioning.  Goal: LTG: Reduce frequency, intensity, and duration of PTSD symptoms so daily functioning is improved: Input needed on appropriate metric.  Patient will score less than 5 on the pt self report (1-10) Outcome: Progressing  Goal: STG: Practice interpersonal effectiveness skills 7 times per week for the next 16 weeks Outcome: Progressing  Interventions:  Intervention: REVIEW PLEASE SKILLS (TREAT PHYSICAL ILLNESS, BALANCE EATING, AVOID MOOD-ALTERING SUBSTANCES, BALANCE SLEEP AND GET EXERCISE) WITH [name]  Intervention: Assist with coping skills and behavior  Intervention: Provide grief and bereavement support  Intervention: Give positive reinforcement and  praise  Intervention: Venia Carbon TO IDENTIFY 3 TRAUMA RELATED COGNITIVE DISTORTIONS  Summary: Teresa Crawford is a 35 y.o. female who presents with continuing symptoms related to PTSD diagnosis. Pt reports stable mood and that she is compliant with medication.   Allowed pt to explore and express thoughts and feelings associated with recent life situations and external stressors. Discussed relationship with boyfriend--pt is skeptical about periods of time when boyfriend will "ghost" and she will not hear from him for 1-2 weeks at a time.   Pts boyfriend wants pt to move in with him and thinks that he wants to get married.   Continued recommendations are as follows: self care behaviors, positive social engagements, focusing on overall work/home/life balance, and focusing on positive physical and emotional wellness.   Suicidal/Homicidal: No  Therapist Response: Pt is continuing to apply interventions learned in session into daily life situations. Pt is currently on track to meet goals utilizing interventions mentioned above. Personal growth and progress noted. Treatment to continue as indicated.   Plan: Return again in 4 weeks.  Diagnosis: Axis I: PTSD    Axis II: No diagnosis    [place], LCSW 01/08/2021

## 2021-01-08 NOTE — Plan of Care (Signed)
  Problem: Reduce the negative impact trauma related symptoms have on social, occupational, and family functioning. Goal: LTG: Reduce frequency, intensity, and duration of PTSD symptoms so daily functioning is improved: Input needed on appropriate metric.  Patient will score less than 5 on the pt self report (1-10) Outcome: Progressing Goal: STG: Practice interpersonal effectiveness skills 7 times per week for the next 16 weeks Outcome: Progressing Intervention: REVIEW PLEASE SKILLS (TREAT PHYSICAL ILLNESS, BALANCE EATING, AVOID MOOD-ALTERING SUBSTANCES, BALANCE SLEEP AND GET EXERCISE) WITH [name] Intervention: Assist with coping skills and behavior Intervention: Provide grief and bereavement support Intervention: Give positive reinforcement and praise Intervention: Venia Carbon TO IDENTIFY 3 TRAUMA RELATED COGNITIVE DISTORTIONS

## 2021-01-14 ENCOUNTER — Other Ambulatory Visit: Payer: Self-pay

## 2021-01-14 ENCOUNTER — Ambulatory Visit
Admission: RE | Admit: 2021-01-14 | Discharge: 2021-01-14 | Disposition: A | Discharge status: Home / Self Care | Payer: Medicare Other | Source: Ambulatory Visit | Attending: Nurse Practitioner | Admitting: Nurse Practitioner

## 2021-01-14 DIAGNOSIS — Z1231 Encounter for screening mammogram for malignant neoplasm of breast: Secondary | ICD-10-CM | POA: Insufficient documentation

## 2021-01-16 ENCOUNTER — Inpatient Hospital Stay
Admission: RE | Admit: 2021-01-16 | Discharge: 2021-01-16 | Disposition: A | Discharge status: Home / Self Care | Payer: Self-pay | Source: Ambulatory Visit | Attending: *Deleted | Admitting: *Deleted

## 2021-01-16 ENCOUNTER — Other Ambulatory Visit: Payer: Self-pay | Admitting: *Deleted

## 2021-01-16 DIAGNOSIS — Z1231 Encounter for screening mammogram for malignant neoplasm of breast: Secondary | ICD-10-CM

## 2021-02-04 ENCOUNTER — Telehealth: Payer: Self-pay | Admitting: *Deleted

## 2021-02-04 ENCOUNTER — Encounter: Payer: Self-pay | Admitting: Psychiatry

## 2021-02-04 ENCOUNTER — Other Ambulatory Visit: Payer: Self-pay

## 2021-02-04 ENCOUNTER — Telehealth (INDEPENDENT_AMBULATORY_CARE_PROVIDER_SITE_OTHER): Payer: Medicare Other | Admitting: Psychiatry

## 2021-02-04 DIAGNOSIS — F4310 Post-traumatic stress disorder, unspecified: Secondary | ICD-10-CM

## 2021-02-04 DIAGNOSIS — F5105 Insomnia due to other mental disorder: Secondary | ICD-10-CM

## 2021-02-04 DIAGNOSIS — F3342 Major depressive disorder, recurrent, in full remission: Secondary | ICD-10-CM

## 2021-02-04 MED ORDER — TRAZODONE HCL 50 MG PO TABS: 25.0000 mg | ORAL_TABLET | Freq: Every day | ORAL | 1 refills | Status: DC

## 2021-02-04 MED ORDER — BUPROPION HCL 75 MG PO TABS: 75.0000 mg | ORAL_TABLET | Freq: Every day | ORAL | 0 refills | Status: DC

## 2021-02-04 MED ORDER — TRAZODONE HCL 50 MG PO TABS: 25.0000 mg | ORAL_TABLET | Freq: Every evening | ORAL | 1 refills | Status: DC | PRN

## 2021-02-04 NOTE — Telephone Encounter (Signed)
Spoke to the pharmacist, changed trazodone to half tablet at bedtime scheduled since patient is in a group home.

## 2021-02-04 NOTE — Progress Notes (Signed)
Virtual Visit via Video Note  I connected with Teresa Crawford on 02/04/21 at 11:00 AM EDT by a video enabled telemedicine application and verified that I am speaking with the correct person using two identifiers.  Location Provider Location : [facility] Patient Location : Home  Participants: Patient , Provider    I discussed the limitations of evaluation and management by telemedicine and the availability of in person appointments. The patient expressed understanding and agreed to proceed.   I discussed the assessment and treatment plan with the patient. The patient was provided an opportunity to ask questions and all were answered. The patient agreed with the plan and demonstrated an understanding of the instructions.   The patient was advised to call back or seek an in-person evaluation if the symptoms worsen or if the condition fails to improve as anticipated.  [place] MD OP Progress Note  02/04/2021 11:29 AM Teresa Crawford  MRN:  [id number]  Chief Complaint:  Chief Complaint   Follow-up; Anxiety; Depression    HPI: Teresa Crawford is a 35 year old Caucasian female who is single, lives in [place], has a history of MDD, PTSD, insomnia, bereavement was evaluated by telemedicine today.  Patient today reports she is currently compliant on her medications.  The Wellbutrin does help with her depression.  Denies any mood swings.  She continues to go to work and reports work is going well.  She does have job related stressors however she has been coping okay.  Sleep continues to be good, takes trazodone 25 mg at bedtime.  Denies side effects to medications.  Patient denies suicidality, homicidality or perceptual disturbances.  Patient denies any other concerns today.  Visit Diagnosis:    ICD-10-CM   1. MDD (major depressive disorder), recurrent, in full remission ([place])  F33.42 buPROPion (WELLBUTRIN) 75 MG tablet    2. Posttraumatic stress disorder  F43.10     3. Insomnia due to  mental condition  F51.05 DISCONTINUED: traZODone (DESYREL) 50 MG tablet   depression      Past Psychiatric History: Reviewed past psychiatric history from progress note on 08/16/2018  Past Medical History:  Past Medical History:  Diagnosis Date   B12 deficiency anemia    Chronic back pain    Chronic fatigue syndrome    Constipation    Depression    Elevated liver enzymes    Endometriosis    Fibroids    Hepatic steatosis    Hypothyroid    Last menstrual period (LMP) > 10 days ago 09/12/15   Mental retardation    Migraine    Post traumatic stress disorder    Posttraumatic stress disorder    PUD (peptic ulcer disease)    Scoliosis    Splenomegaly    Uterine fibroid     Past Surgical History:  Procedure Laterality Date   ABDOMINAL HYSTERECTOMY     BACK SURGERY     COLONOSCOPY  04/27/2006   Wohl-colitis, internal hemorrhoids   COLONOSCOPY  02/2013   focal right colitis, left colon biopsy negative, focal active proctitis without chronicity   ESOPHAGOGASTRODUODENOSCOPY  02/2013   multiple peptic & duodenal ulcers, negative h pylori   INTRAUTERINE DEVICE INSERTION  2012   LAPAROSCOPIC ENDOMETRIOSIS FULGURATION  2015   [name]   OVARIAN CYST REMOVAL      Family Psychiatric History: Reviewed family psychiatric history from progress note on 08/16/2018  Family History:  Family History  Problem Relation Age of Onset   Breast cancer Mother 66   COPD  Father    Lung cancer Father 21   Dementia Father    Diabetes Sister    Diabetes Brother    Ovarian cancer Paternal Aunt    Colon cancer Maternal Grandfather 74   Liver disease Maternal Aunt        ?etiology   Lymphoma Maternal Grandmother 62   Lung cancer Other 30       maternal great grandfather   Stomach cancer Other 15       maternal great grandmother    Social History: Reviewed social history from progress note on 08/16/2018 Social History   Socioeconomic History   Marital status: Single    Spouse name: Not on file    Number of children: 0   Years of education: Not on file   Highest education level: Not on file  Occupational History   Not on file  Tobacco Use   Smoking status: Never   Smokeless tobacco: Never  Vaping Use   Vaping Use: Never used  Substance and Sexual Activity   Alcohol use: Not Currently    Alcohol/week: 0.0 standard drinks   Drug use: No   Sexual activity: Not Currently  Other Topics Concern   Not on file  Social History Narrative   She has brother, half brother & sister live nearby   Both parents deceased   Care managed by [name]   Social Determinants of Health   Financial Resource Strain: Not on file  Food Insecurity: Not on file  Transportation Needs: Not on file  Physical Activity: Not on file  Stress: Not on file  Social Connections: Not on file    Allergies:  Allergies  Allergen Reactions   Other Anaphylaxis    Peanut butter, uses Epi pen   Peanut Butter Flavor Swelling    Throat swells Throat swells   Sertraline Other (See Comments)    Panic attacks   Codeine Nausea And Vomiting    nausea   Paroxetine Other (See Comments)    Panic Attack    Paroxetine Hcl Other (See Comments)    Panic attack Other reaction(s): Other (See Comments) Panic attacks   Venlafaxine Other (See Comments)    Panic Attack    Zoloft [Sertraline Hcl] Other (See Comments)    Panic attack    Metabolic Disorder Labs: Lab Results  Component Value Date   HGBA1C 5.5 12/20/2019   No results found for: PROLACTIN Lab Results  Component Value Date   CHOL 152 12/27/2020   TRIG 93 12/27/2020   HDL 35 (L) 12/27/2020   CHOLHDL 4.3 12/27/2020   LDLCALC 99 12/27/2020   LDLCALC 114 (H) 12/20/2019   Lab Results  Component Value Date   TSH 1.750 12/27/2020   TSH 3.820 12/20/2019    Therapeutic Level Labs: No results found for: LITHIUM No results found for: VALPROATE No components found for:  CBMZ  Current Medications: Current Outpatient Medications  Medication  Sig Dispense Refill   acetaminophen (TYLENOL) 500 MG tablet 2 TABS PO BID PRN FOR HEADACHES AND PAINS 45 tablet 5   albuterol (PROAIR HFA) 108 (90 Base) MCG/ACT inhaler Inhale 2 puffs into the lungs every 6 (six) hours as needed for wheezing or shortness of breath. 1 each 3   buPROPion (WELLBUTRIN) 75 MG tablet Take 1 tablet (75 mg total) by mouth daily with breakfast. 90 tablet 0   cetirizine (ZYRTEC) 10 MG tablet TAKE 1 TABLET BY MOUTH AT BEDTIME FOR ALLERGIES 30 tablet 5   docusate sodium (DOK)  100 MG capsule TAKE 1 CAPSULE BY MOUTH TWICE DAILY FOR STOOL SOFTENER 60 capsule 5   EPINEPHrine 0.3 mg/0.3 mL IJ SOAJ injection USE FOR ANAPHYLAXIS 1 each 5   fluticasone (FLONASE) 50 MCG/ACT nasal spray Place 2 sprays into both nostrils daily. 16 g 5   fluticasone (FLOVENT HFA) 220 MCG/ACT inhaler INHALE 2 PUFFS INTO THE LUNGS, 2 TIMES PER DAY. RINSE MOUTH AFTER INHALATIONS 12 g 11   ibuprofen (ADVIL) 600 MG tablet ONE TAB PO BID PRN FOR PAIN AND HEADACHES 30 tablet 5   omeprazole (PRILOSEC) 40 MG capsule Take 1 capsule (40 mg total) by mouth 2 (two) times daily. 60 capsule 5   promethazine (PHENERGAN) 12.5 MG tablet Take 1 tablet (12.5 mg total) by mouth every 8 (eight) hours as needed for nausea or vomiting. 20 tablet 0   tamsulosin (FLOMAX) 0.4 MG CAPS capsule      topiramate (TOPAMAX) 50 MG tablet Take 1 tablet (50 mg total) by mouth every evening. 30 tablet 5   traMADol (ULTRAM) 50 MG tablet ONE TAB PO BID PRN FOR PAIN AND HEADCAHES 20 tablet 3   traZODone (DESYREL) 50 MG tablet Take 0.5 tablets (25 mg total) by mouth at bedtime. 45 tablet 1   No current facility-administered medications for this visit.     Musculoskeletal: Strength & Muscle Tone:  UTA Gait & Station: normal Patient leans: N/A  Psychiatric Specialty Exam: Review of Systems  Psychiatric/Behavioral:  Negative for agitation, behavioral problems, confusion, hallucinations, self-injury, sleep disturbance and suicidal ideas.  The patient is not nervous/anxious and is not hyperactive.   All other systems reviewed and are negative.  Last menstrual period 01/05/2016.There is no height or weight on file to calculate BMI.  General Appearance: Casual  Eye Contact:  Good  Speech:  Clear and Coherent  Volume:  Normal  Mood:  Euthymic  Affect:  Congruent  Thought Process:  Goal Directed and Descriptions of Associations: Intact  Orientation:  Full (Time, Place, and Person)  Thought Content: Logical   Suicidal Thoughts:  No  Homicidal Thoughts:  No  Memory:  Immediate;   Fair Recent;   Fair Remote;   Fair  Judgement:  Fair  Insight:  Fair  Psychomotor Activity:  Normal  Concentration:  Concentration: Fair and Attention Span: Fair  Recall:  [employer] of Knowledge: Fair  Language: Fair  Akathisia:  No  Handed:  Right  AIMS (if indicated): done  Assets:  Communication Skills Desire for [place] Talents/Skills Transportation  ADL's:  Intact  Cognition: WNL  Sleep:  Fair   Screenings: Mini-Mental    Flowsheet Row Clinical Support from 12/25/2020 in [facility], [place] from 12/19/2019 in [facility], [facility] Visit from 10/13/2017 in [facility], [facility]  Total Score (max 30 points ) 29 30 27       PHQ2-9    Flowsheet Row Counselor from 01/08/2021 in [place] from 12/25/2020 in [facility], [facility] Video Visit from 09/25/2020 in [place] from 09/18/2020 in [place] from 08/20/2020 in [place]  PHQ-2 Total Score 0 0 0 0 2  PHQ-9 Total Score -- -- -- -- 11      Flowsheet Row Counselor from 01/08/2021 in [place] from 10/30/2020 in [place] from 09/18/2020 in [place] No Risk No  Risk No Risk        Assessment and Plan: ARMINTA GAMM is a 35 year old Caucasian female who has a history of MDD, PTSD, insomnia, single, currently works part-time, is on [employer], was evaluated by telemedicine today.  Patient is currently stable.  Plan as noted below.  Plan MDD in remission Wellbutrin 75 mg p.o. daily Trazodone 25 mg p.o. nightly . Continue CBT with Ms. Christina Hussami AIMS - 0  PTSD-stable Continue CBT  Insomnia-stable Trazodone 25 - 50 mg p.o. nightly  Follow-up in clinic in 2 months in person.  This note was generated in part or whole with voice recognition software. Voice recognition is usually quite accurate but there are transcription errors that can and very often do occur. I apologize for any typographical errors that were not detected and corrected.         Ursula Alert, MD 02/05/2021, 8:29 AM

## 2021-02-04 NOTE — Telephone Encounter (Signed)
[facility] -needed to verify regarding the for the dosage if 1 or 0.5 and  route medication if daily or as needed. Pharmacy mention pt is in the group home.

## 2021-02-07 ENCOUNTER — Encounter: Payer: Self-pay | Admitting: Nurse Practitioner

## 2021-02-07 ENCOUNTER — Ambulatory Visit (INDEPENDENT_AMBULATORY_CARE_PROVIDER_SITE_OTHER): Payer: Medicare Other | Admitting: Nurse Practitioner

## 2021-02-07 ENCOUNTER — Other Ambulatory Visit: Payer: Self-pay

## 2021-02-07 VITALS — BP 117/61 | HR 70 | Temp 98.1°F | Resp 16 | Ht 65.0 in | Wt 195.6 lb

## 2021-02-07 DIAGNOSIS — J029 Acute pharyngitis, unspecified: Secondary | ICD-10-CM

## 2021-02-07 DIAGNOSIS — R42 Dizziness and giddiness: Secondary | ICD-10-CM

## 2021-02-07 DIAGNOSIS — J0110 Acute frontal sinusitis, unspecified: Secondary | ICD-10-CM

## 2021-02-07 MED ORDER — AMOXICILLIN-POT CLAVULANATE 875-125 MG PO TABS: 1.0000 | ORAL_TABLET | Freq: Two times a day (BID) | ORAL | 0 refills | Status: AC

## 2021-02-07 MED ORDER — MECLIZINE HCL 25 MG PO TABS: 25.0000 mg | ORAL_TABLET | Freq: Three times a day (TID) | ORAL | 0 refills | Status: DC | PRN

## 2021-02-07 NOTE — Progress Notes (Signed)
[facility] [place], [place] [address]  Internal MEDICINE  Office Visit Note  Patient Name: Teresa Crawford  [id number]  [id number]  Date of Service: 02/07/2021  Chief Complaint  Patient presents with   Acute Visit    Right ear pain and ringing, pain level is a 9 out of 10     HPI Teresa Crawford presents for an acute sick visit for symptoms of sinusitis. She reports right ear pain and tinnitus. She has nasal congestion, fatigue, chills, lack of appetite, sore throat, wheezing, cough, SOB, chest tightness, dizziness and headaches. She denies sinus pain, postnasal drip, runny nose, or body aches. She has also experienced nausea, vomiting and diarrhea. This has been going on for the past few days.      Current Medication:  Outpatient Encounter Medications as of 02/07/2021  Medication Sig   acetaminophen (TYLENOL) 500 MG tablet 2 TABS PO BID PRN FOR HEADACHES AND PAINS   albuterol (PROAIR HFA) 108 (90 Base) MCG/ACT inhaler Inhale 2 puffs into the lungs every 6 (six) hours as needed for wheezing or shortness of breath.   [EXPIRED] amoxicillin-clavulanate (AUGMENTIN) 875-125 MG tablet Take 1 tablet by mouth 2 (two) times daily for 7 days. Take with food   buPROPion (WELLBUTRIN) 75 MG tablet Take 1 tablet (75 mg total) by mouth daily with breakfast.   cetirizine (ZYRTEC) 10 MG tablet TAKE 1 TABLET BY MOUTH AT BEDTIME FOR ALLERGIES   docusate sodium (DOK) 100 MG capsule TAKE 1 CAPSULE BY MOUTH TWICE DAILY FOR STOOL SOFTENER   EPINEPHrine 0.3 mg/0.3 mL IJ SOAJ injection USE FOR ANAPHYLAXIS   fluticasone (FLONASE) 50 MCG/ACT nasal spray Place 2 sprays into both nostrils daily.   fluticasone (FLOVENT HFA) 220 MCG/ACT inhaler INHALE 2 PUFFS INTO THE LUNGS, 2 TIMES PER DAY. RINSE MOUTH AFTER INHALATIONS   ibuprofen (ADVIL) 600 MG tablet ONE TAB PO BID PRN FOR PAIN AND HEADACHES   meclizine (ANTIVERT) 25 MG tablet Take 1 tablet (25 mg total) by mouth 3 (three) times daily as  needed for dizziness or nausea.   omeprazole (PRILOSEC) 40 MG capsule Take 1 capsule (40 mg total) by mouth 2 (two) times daily.   promethazine (PHENERGAN) 12.5 MG tablet Take 1 tablet (12.5 mg total) by mouth every 8 (eight) hours as needed for nausea or vomiting.   tamsulosin (FLOMAX) 0.4 MG CAPS capsule    topiramate (TOPAMAX) 50 MG tablet Take 1 tablet (50 mg total) by mouth every evening.   traMADol (ULTRAM) 50 MG tablet ONE TAB PO BID PRN FOR PAIN AND HEADCAHES   traZODone (DESYREL) 50 MG tablet Take 0.5 tablets (25 mg total) by mouth at bedtime.   No facility-administered encounter medications on file as of 02/07/2021.      Medical History: Past Medical History:  Diagnosis Date   B12 deficiency anemia    Chronic back pain    Chronic fatigue syndrome    Constipation    Depression    Elevated liver enzymes    Endometriosis    Fibroids    Hepatic steatosis    Hypothyroid    Last menstrual period (LMP) > 10 days ago 09/12/15   Mental retardation    Migraine    Post traumatic stress disorder    Posttraumatic stress disorder    PUD (peptic ulcer disease)    Scoliosis    Splenomegaly    Uterine fibroid      Vital Signs: BP 117/61   Pulse 70  Temp 98.1 F (36.7 C)   Resp 16   Ht 5\' 5"  (1.651 m)   Wt 195 lb 9.6 oz (88.7 kg)   LMP 01/05/2016   SpO2 99%   BMI 32.55 kg/m    Review of Systems  Constitutional:  Positive for appetite change, chills and fatigue. Negative for fever.  HENT:  Positive for congestion, ear pain (right ear pain started saturday night), sneezing, sore throat, tinnitus and voice change. Negative for postnasal drip, rhinorrhea, sinus pressure, sinus pain and trouble swallowing.   Eyes: Negative.  Negative for pain.  Respiratory:  Positive for cough, chest tightness, shortness of breath and wheezing.   Cardiovascular:  Negative for chest pain and palpitations.  Gastrointestinal:  Positive for diarrhea, nausea and vomiting. Negative for abdominal  pain.  Musculoskeletal:  Negative for myalgias.  Skin:  Negative for rash.  Neurological:  Positive for dizziness and headaches.   Physical Exam Vitals reviewed.  Constitutional:      General: She is not in acute distress.    Appearance: Normal appearance. She is obese. She is not ill-appearing.  HENT:     Head: Normocephalic and atraumatic.     Right Ear: Tympanic membrane, ear canal and external ear normal.     Left Ear: Tympanic membrane, ear canal and external ear normal.     Nose: Congestion present. No rhinorrhea.     Mouth/Throat:     Pharynx: Oropharyngeal exudate present.  Eyes:     Extraocular Movements: Extraocular movements intact.     Pupils: Pupils are equal, round, and reactive to light.  Cardiovascular:     Rate and Rhythm: Normal rate and regular rhythm.     Pulses: Normal pulses.     Heart sounds: Normal heart sounds. No murmur heard. Pulmonary:     Effort: Pulmonary effort is normal. No respiratory distress.     Breath sounds: Normal breath sounds. No wheezing.  Lymphadenopathy:     Cervical: Cervical adenopathy present.  Skin:    General: Skin is warm and dry.  Neurological:     Mental Status: She is alert and oriented to person, place, and time.     Cranial Nerves: No cranial nerve deficit.     Coordination: Coordination normal.     Gait: Gait normal.  Psychiatric:        Mood and Affect: Mood normal.        Behavior: Behavior normal.      Assessment/Plan: 1. Acute non-recurrent frontal sinusitis Empiric antibiotic treatment prescribed.  - amoxicillin-clavulanate (AUGMENTIN) 875-125 MG tablet; Take 1 tablet by mouth 2 (two) times daily for 7 days. Take with food  Dispense: 14 tablet; Refill: 0  2. Sore throat Negative for flu - POCT Influenza A/B  3. Vertigo, intermittent Has been having issues with dizziness and vertigo for a while, meclizine prescribed.  - meclizine (ANTIVERT) 25 MG tablet; Take 1 tablet (25 mg total) by mouth 3 (three)  times daily as needed for dizziness or nausea.  Dispense: 30 tablet; Refill: 0   General Counseling: Teresa Crawford verbalizes understanding of the findings of todays visit and agrees with plan of treatment. I have discussed any further diagnostic evaluation that may be needed or ordered today. We also reviewed her medications today. she has been encouraged to call the office with any questions or concerns that should arise related to todays visit.    Counseling:    Orders Placed This Encounter  Procedures   POCT Influenza A/B    Meds  ordered this encounter  Medications   meclizine (ANTIVERT) 25 MG tablet    Sig: Take 1 tablet (25 mg total) by mouth 3 (three) times daily as needed for dizziness or nausea.    Dispense:  30 tablet    Refill:  0   amoxicillin-clavulanate (AUGMENTIN) 875-125 MG tablet    Sig: Take 1 tablet by mouth 2 (two) times daily for 7 days. Take with food    Dispense:  14 tablet    Refill:  0    Return if symptoms worsen or fail to improve.  [place] Controlled Substance Database was reviewed by me for overdose risk score (ORS)  Time spent:30 Minutes Time spent with patient included reviewing progress notes, labs, imaging studies, and discussing plan for follow up.   This patient was seen by Jonetta Osgood, FNP-C in collaboration with Dr. Clayborn Bigness as a part of collaborative care agreement.  Damyia Strider R. Valetta Fuller, MSN, FNP-C Internal Medicine

## 2021-02-11 ENCOUNTER — Other Ambulatory Visit: Payer: Self-pay

## 2021-02-11 ENCOUNTER — Ambulatory Visit (INDEPENDENT_AMBULATORY_CARE_PROVIDER_SITE_OTHER): Payer: Medicare Other | Admitting: Licensed Clinical Social Worker

## 2021-02-11 DIAGNOSIS — F4310 Post-traumatic stress disorder, unspecified: Secondary | ICD-10-CM | POA: Diagnosis not present

## 2021-02-11 DIAGNOSIS — F3342 Major depressive disorder, recurrent, in full remission: Secondary | ICD-10-CM | POA: Diagnosis not present

## 2021-02-11 DIAGNOSIS — Z634 Disappearance and death of family member: Secondary | ICD-10-CM | POA: Diagnosis not present

## 2021-02-11 NOTE — Progress Notes (Signed)
Virtual Visit via Video Note  I connected with Teresa Crawford on 02/11/21 at  2:00 PM EST by a video enabled telemedicine application and verified that I am speaking with the correct person using two identifiers.  Location: Patient: home Provider: remote office [place], [name]   I discussed the limitations of evaluation and management by telemedicine and the availability of in person appointments. The patient expressed understanding and agreed to proceed.  I discussed the assessment and treatment plan with the patient. The patient was provided an opportunity to ask questions and all were answered. The patient agreed with the plan and demonstrated an understanding of the instructions.   The patient was advised to call back or seek an in-person evaluation if the symptoms worsen or if the condition fails to improve as anticipated.  I provided 40 minutes of non-face-to-face time during this encounter.   [place], LCSW   THERAPIST PROGRESS NOTE  Session Time: 2-240p  Participation Level: Active  Behavioral Response: NeatAlertAnxious and Depressed  Type of Therapy: Individual Therapy  Treatment Goals addressed:  Problem: Reduce the negative impact trauma related symptoms have on social, occupational, and family functioning. Goal: LTG: Reduce frequency, intensity, and duration of PTSD symptoms so daily functioning is improved: Input needed on appropriate metric.  Patient will score less than 5 on the pt self report (1-10) Outcome: Progressing Goal: STG: Practice interpersonal effectiveness skills 7 times per week for the next 16 weeks Outcome: Progressing Interventions:  Intervention: Work with patient to identify the major components of a recent episode of anxiety: physical symptoms, major thoughts and images, and major behaviors they experienced Intervention: Educate patient on: Stress management Intervention: Encourage patient to identify triggers Intervention:  Identify effective coping behavior Intervention: Work with patient to identify the major components of a recent episode of anxiety: physical symptoms, major thoughts and images, and major behaviors they experienced   Summary: Teresa Crawford is a 35 y.o. female who presents with improving symptoms related to PTSD diagnosis. Pt reports that she has had some good days and bad days. Pt reports that she is compliant with her medication and that she is getting good quality and quantity of sleep.  Allowed pt to explore and express thoughts and feelings associated with recent life situations and external stressors. Discussed relationship with boyfriend--pt has not seen boyfriend since July but they are discussing moving in together. Weighed out the pros and cons of cohabitation with pt--pt reports that she enjoys living independently and is not sure if she is psychologically ready to move in with someone right now. Pt is very close with her sister and pts sister gives her good advice with relationship situations and decisions such as this. Reviewed importance of overall life balance and self care with pt.  Continued recommendations are as follows: self care behaviors, positive social engagements, focusing on overall work/home/life balance, and focusing on positive physical and emotional wellness.   Suicidal/Homicidal: No  Therapist Response: Pt is continuing to apply interventions learned in session into daily life situations. Pt is currently on track to meet goals utilizing interventions mentioned above. Personal growth and progress noted. Treatment to continue as indicated.   Plan: Return again in 4 weeks.  Diagnosis: Axis I: PTSD    Axis II: No diagnosis    [place], LCSW 02/11/2021

## 2021-02-11 NOTE — Plan of Care (Signed)
  Problem: Reduce the negative impact trauma related symptoms have on social, occupational, and family functioning. Goal: LTG: Reduce frequency, intensity, and duration of PTSD symptoms so daily functioning is improved: Input needed on appropriate metric.  Patient will score less than 5 on the pt self report (1-10) Outcome: Progressing Goal: STG: Practice interpersonal effectiveness skills 7 times per week for the next 16 weeks Outcome: Progressing Intervention: Work with patient to identify the major components of a recent episode of anxiety: physical symptoms, major thoughts and images, and major behaviors they experienced Intervention: Educate patient on: Stress management Intervention: Encourage patient to identify triggers Intervention: Identify effective coping behavior Intervention: Work with patient to identify the major components of a recent episode of anxiety: physical symptoms, major thoughts and images, and major behaviors they experienced

## 2021-03-09 ENCOUNTER — Encounter: Payer: Self-pay | Admitting: Nurse Practitioner

## 2021-03-09 LAB — POCT INFLUENZA A/B
Influenza A, POC: NEGATIVE
Influenza B, POC: NEGATIVE

## 2021-03-25 ENCOUNTER — Ambulatory Visit (INDEPENDENT_AMBULATORY_CARE_PROVIDER_SITE_OTHER): Payer: Medicare Other | Admitting: Licensed Clinical Social Worker

## 2021-03-25 ENCOUNTER — Other Ambulatory Visit: Payer: Self-pay

## 2021-03-25 DIAGNOSIS — F4310 Post-traumatic stress disorder, unspecified: Secondary | ICD-10-CM | POA: Diagnosis not present

## 2021-03-25 NOTE — Progress Notes (Signed)
Virtual Visit via Audio Note  I connected with Teresa Crawford on 03/25/21 at  2:00 PM EST by an audio enabled telemedicine application and verified that I am speaking with the correct person using two identifiers.  Location: Patient: home Provider: remote office [place], [name]   I discussed the limitations of evaluation and management by telemedicine and the availability of in person appointments. The patient expressed understanding and agreed to proceed.  I discussed the assessment and treatment plan with the patient. The patient was provided an opportunity to ask questions and all were answered. The patient agreed with the plan and demonstrated an understanding of the instructions.   The patient was advised to call back or seek an in-person evaluation if the symptoms worsen or if the condition fails to improve as anticipated.  I provided 45 minutes of non-face-to-face time during this encounter.   Omarie Parcell R Jamillah Camilo, LCSW   THERAPIST PROGRESS NOTE  Session Time: 215-3p  Participation Level: Active  Behavioral Response: NeatAlertAnxious and Depressed  Type of Therapy: Individual Therapy  Treatment Goals addressed:  Problem: Reduce the negative impact trauma related symptoms have on social, occupational, and family functioning.  Goal: LTG: Reduce frequency, intensity, and duration of PTSD symptoms so daily functioning is improved: Input needed on appropriate metric.  Patient will score less than 5 on the pt self report (1-10) Outcome: Progressing  Goal: STG: Practice interpersonal effectiveness skills 7 times per week for the next 16 weeks Outcome: Progressing  Interventions:  Intervention: Work with patient to identify the major components of a recent episode of anxiety: physical symptoms, major thoughts and images, and major behaviors they experienced  Intervention: Educate patient on: Stress management  Intervention: Encourage patient to identify  triggers  Intervention: Identify effective coping behavior  Intervention: Work with patient to identify the major components of a recent episode of anxiety: physical symptoms, major thoughts and images, and major behaviors they experienced   Summary: Teresa Crawford is a 35 y.o. female who presents with improving symptoms related to PTSD diagnosis. Pt reports that she has had some good days and bad days. Pt reports that she is compliant with her medication and that she is getting good quality and quantity of sleep.  Allowed pt to explore and express thoughts and feelings associated with recent life situations and external stressors. Discussed relationship with boyfriend--things are continuing to go well. Pt states that they are talking about marriage and living together. Pt even reports that she is talking about wanting to adopt a child. 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   Reviewed coping mechanisms for managing depression symptoms and anxiety symptoms.   Continued recommendations are as follows: self care behaviors, positive social engagements, focusing on overall work/home/life balance, and focusing on positive physical and emotional wellness.   Suicidal/Homicidal: No  Therapist Response: Pt is continuing to apply interventions learned in session into daily life situations. Pt is currently on track to meet goals utilizing interventions mentioned above. Personal growth and progress noted. Treatment to continue as indicated.   Plan: Return again in 4 weeks.  Diagnosis: Axis I: PTSD    Axis II: No diagnosis    [place], LCSW 03/25/2021

## 2021-03-26 NOTE — Plan of Care (Signed)
°  Problem: Reduce the negative impact trauma related symptoms have on social, occupational, and family functioning. Goal: LTG: Reduce frequency, intensity, and duration of PTSD symptoms so daily functioning is improved: Input needed on appropriate metric.  Patient will score less than 5 on the pt self report (1-10) Outcome: Progressing Goal: STG: Practice interpersonal effectiveness skills 7 times per week for the next 16 weeks Outcome: Progressing

## 2021-04-07 ENCOUNTER — Other Ambulatory Visit: Payer: Self-pay | Admitting: Internal Medicine

## 2021-04-07 DIAGNOSIS — J301 Allergic rhinitis due to pollen: Secondary | ICD-10-CM

## 2021-04-08 ENCOUNTER — Ambulatory Visit (INDEPENDENT_AMBULATORY_CARE_PROVIDER_SITE_OTHER): Payer: Medicare Other | Admitting: Psychiatry

## 2021-04-08 ENCOUNTER — Other Ambulatory Visit: Payer: Self-pay

## 2021-04-08 ENCOUNTER — Encounter: Payer: Self-pay | Admitting: Psychiatry

## 2021-04-08 VITALS — BP 110/75 | HR 94 | Temp 97.8°F | Wt 191.4 lb

## 2021-04-08 DIAGNOSIS — F5105 Insomnia due to other mental disorder: Secondary | ICD-10-CM

## 2021-04-08 DIAGNOSIS — F3342 Major depressive disorder, recurrent, in full remission: Secondary | ICD-10-CM | POA: Diagnosis not present

## 2021-04-08 DIAGNOSIS — F4310 Post-traumatic stress disorder, unspecified: Secondary | ICD-10-CM | POA: Diagnosis not present

## 2021-04-08 MED ORDER — BUPROPION HCL 75 MG PO TABS: 75.0000 mg | ORAL_TABLET | Freq: Every day | ORAL | 0 refills | Status: DC

## 2021-04-08 NOTE — Progress Notes (Signed)
[facility] MD OP Progress Note  04/08/2021 3:18 PM Teresa Crawford  MRN:  [id number]  Chief Complaint:  Chief Complaint   Follow-up; Depression    HPI: Teresa Crawford is a 36 year old Caucasian female who is single, lives in [place], has a history of MDD, PTSD, insomnia, bereavement was evaluated in office today.  Patient presented along with her caregiver- [name].  Patient today reports she had a good holiday with her sister.  She reports she continues to work and reports work is going well.  Denies any significant depression, trauma related symptoms or anxiety.  Reports sleep is overall good.  She is compliant on her medications as prescribed.  Denies side effects.  Patient denies any suicidality, homicidality or perceptual disturbances.  Patient denies any other concerns today.  Collateral information obtained from caregiver who was present in session, who reported patient is doing well.  Visit Diagnosis:    ICD-10-CM   1. MDD (major depressive disorder), recurrent, in full remission ([address])  F33.42 buPROPion (WELLBUTRIN) 75 MG tablet    2. Posttraumatic stress disorder  F43.10     3. Insomnia due to mental condition  F51.05    Depression      Past Psychiatric History: I have reviewed past psychiatric history from progress note on 08/16/2018.  Past Medical History:  Past Medical History:  Diagnosis Date   B12 deficiency anemia    Chronic back pain    Chronic fatigue syndrome    Constipation    Depression    Elevated liver enzymes    Endometriosis    Fibroids    Hepatic steatosis    Hypothyroid    Last menstrual period (LMP) > 10 days ago 09/12/15   Mental retardation    Migraine    Post traumatic stress disorder    Posttraumatic stress disorder    PUD (peptic ulcer disease)    Scoliosis    Splenomegaly    Uterine fibroid     Past Surgical History:  Procedure Laterality Date   ABDOMINAL HYSTERECTOMY     BACK SURGERY     COLONOSCOPY  04/27/2006    Wohl-colitis, internal hemorrhoids   COLONOSCOPY  02/2013   focal right colitis, left colon biopsy negative, focal active proctitis without chronicity   ESOPHAGOGASTRODUODENOSCOPY  02/2013   multiple peptic & duodenal ulcers, negative h pylori   INTRAUTERINE DEVICE INSERTION  2012   LAPAROSCOPIC ENDOMETRIOSIS FULGURATION  2015   [name]   OVARIAN CYST REMOVAL      Family Psychiatric History: I have reviewed family psychiatric history from progress note on 08/16/2018.  Family History:  Family History  Problem Relation Age of Onset   Breast cancer Mother 67   COPD Father    Lung cancer Father 60   Dementia Father    Diabetes Sister    Diabetes Brother    Ovarian cancer Paternal Aunt    Colon cancer Maternal Grandfather 76   Liver disease Maternal Aunt        ?etiology   Lymphoma Maternal Grandmother 1   Lung cancer Other 69       maternal great grandfather   Stomach cancer Other 25       maternal great grandmother    Social History: Reviewed social history from progress note on 08/16/2018. Social History   Socioeconomic History   Marital status: Single    Spouse name: Not on file   Number of children: 0   Years of education: Not on file   Highest  education level: Not on file  Occupational History   Not on file  Tobacco Use   Smoking status: Never   Smokeless tobacco: Never  Vaping Use   Vaping Use: Never used  Substance and Sexual Activity   Alcohol use: Not Currently    Alcohol/week: 0.0 standard drinks   Drug use: No   Sexual activity: Not Currently  Other Topics Concern   Not on file  Social History Narrative   She has brother, half brother & sister live nearby   Both parents deceased   Care managed by Teresa Crawford   Social Determinants of Health   Financial Resource Strain: Not on file  Food Insecurity: Not on file  Transportation Needs: Not on file  Physical Activity: Not on file  Stress: Not on file  Social Connections: Not on file    Allergies:   Allergies  Allergen Reactions   Other Anaphylaxis    Peanut butter, uses Epi pen   Peanut Butter Flavor Swelling    Throat swells Throat swells   Sertraline Other (See Comments)    Panic attacks   Codeine Nausea And Vomiting    nausea   Paroxetine Other (See Comments)    Panic Attack    Paroxetine Hcl Other (See Comments)    Panic attack Other reaction(s): Other (See Comments) Panic attacks   Venlafaxine Other (See Comments)    Panic Attack    Zoloft [Sertraline Hcl] Other (See Comments)    Panic attack    Metabolic Disorder Labs: Lab Results  Component Value Date   HGBA1C 5.5 12/20/2019   No results found for: PROLACTIN Lab Results  Component Value Date   CHOL 152 12/27/2020   TRIG 93 12/27/2020   HDL 35 (L) 12/27/2020   CHOLHDL 4.3 12/27/2020   LDLCALC 99 12/27/2020   LDLCALC 114 (H) 12/20/2019   Lab Results  Component Value Date   TSH 1.750 12/27/2020   TSH 3.820 12/20/2019    Therapeutic Level Labs: No results found for: LITHIUM No results found for: VALPROATE No components found for:  CBMZ  Current Medications: Current Outpatient Medications  Medication Sig Dispense Refill   acetaminophen (TYLENOL) 500 MG tablet 2 TABS PO BID PRN FOR HEADACHES AND PAINS 45 tablet 5   albuterol (PROAIR HFA) 108 (90 Base) MCG/ACT inhaler Inhale 2 puffs into the lungs every 6 (six) hours as needed for wheezing or shortness of breath. 1 each 3   cetirizine (ZYRTEC) 10 MG tablet TAKE 1 TABLET BY MOUTH AT BEDTIME FOR ALLERGIES 30 tablet 5   docusate sodium (DOK) 100 MG capsule TAKE 1 CAPSULE BY MOUTH TWICE DAILY FOR STOOL SOFTENER 60 capsule 5   EPINEPHrine 0.3 mg/0.3 mL IJ SOAJ injection USE FOR ANAPHYLAXIS 1 each 5   fluticasone (FLONASE) 50 MCG/ACT nasal spray USE 2 SPRAY INTO EACH NOSTRIL ONCE DAILYFOR CONGESTION OR ALLERGIES 16 g 5   fluticasone (FLOVENT HFA) 220 MCG/ACT inhaler INHALE 2 PUFFS INTO THE LUNGS, 2 TIMES PER DAY. RINSE MOUTH AFTER INHALATIONS 12 g 11    ibuprofen (ADVIL) 600 MG tablet ONE TAB PO BID PRN FOR PAIN AND HEADACHES 30 tablet 5   omeprazole (PRILOSEC) 40 MG capsule Take 1 capsule (40 mg total) by mouth 2 (two) times daily. 60 capsule 5   topiramate (TOPAMAX) 50 MG tablet Take 1 tablet (50 mg total) by mouth every evening. 30 tablet 5   traMADol (ULTRAM) 50 MG tablet ONE TAB PO BID PRN FOR PAIN AND HEADCAHES 20 tablet  3   traZODone (DESYREL) 50 MG tablet Take 0.5 tablets (25 mg total) by mouth at bedtime. 45 tablet 1   buPROPion (WELLBUTRIN) 75 MG tablet Take 1 tablet (75 mg total) by mouth daily with breakfast. 90 tablet 0   meclizine (ANTIVERT) 25 MG tablet Take 1 tablet (25 mg total) by mouth 3 (three) times daily as needed for dizziness or nausea. (Patient not taking: Reported on 04/08/2021) 30 tablet 0   promethazine (PHENERGAN) 12.5 MG tablet Take 1 tablet (12.5 mg total) by mouth every 8 (eight) hours as needed for nausea or vomiting. (Patient not taking: Reported on 04/08/2021) 20 tablet 0   tamsulosin (FLOMAX) 0.4 MG CAPS capsule  (Patient not taking: Reported on 04/08/2021)     No current facility-administered medications for this visit.     Musculoskeletal: Strength & Muscle Tone: within normal limits Gait & Station: normal Patient leans: N/A  Psychiatric Specialty Exam: Review of Systems  Psychiatric/Behavioral:  Negative for agitation, behavioral problems, confusion, decreased concentration, dysphoric mood, hallucinations, self-injury, sleep disturbance and suicidal ideas. The patient is not nervous/anxious and is not hyperactive.   All other systems reviewed and are negative.  Blood pressure 110/75, pulse 94, temperature 97.8 F (36.6 C), temperature source Temporal, weight 191 lb 6.4 oz (86.8 kg), last menstrual period 01/05/2016.Body mass index is 31.85 kg/m.  General Appearance: Casual  Eye Contact:  Fair  Speech:  Clear and Coherent  Volume:  Normal  Mood:  Euthymic  Affect:  Congruent  Thought Process:  Goal  Directed and Descriptions of Associations: Intact  Orientation:  Full (Time, Place, and Person)  Thought Content: Logical   Suicidal Thoughts:  No  Homicidal Thoughts:  No  Memory:  Immediate;   Fair Recent;   Fair Remote;   Fair  Judgement:  Fair  Insight:  Fair  Psychomotor Activity:  Normal  Concentration:  Concentration: Fair and Attention Span: Fair  Recall:  [employer] of Knowledge: Fair  Language: Fair  Akathisia:  No  Handed:  Right  AIMS (if indicated): done, 0  Assets:  Communication Skills Desire for [place] Talents/Skills Transportation  ADL's:  Intact  Cognition: WNL  Sleep:  Fair   Screenings: GAD-7    [place] Office Visit from 04/08/2021 in [facility]  Total GAD-7 Score 0      [place] from 12/25/2020 in [facility], [place] from 12/19/2019 in [facility] Office Visit from 10/13/2017 in [facility], [facility]  Total Score (max 30 points ) 29 30 27       PHQ2-9    [place] Visit from 04/08/2021 in [facility] Counselor from 01/08/2021 in [facility] from 12/25/2020 in [facility], [facility] Video Visit from 09/25/2020 in [facility] from 09/18/2020 in [facility]  PHQ-2 Total Score 0 0 0 0 0      [place] Visit from 04/08/2021 in [facility] Counselor from 03/25/2021 in [facility] [place] from 01/08/2021 in [facility] No Risk No Risk No Risk        Assessment and Plan: SHANIKWA STATE is a 36 year old Caucasian female who has a history of MDD, PTSD, insomnia, currently works part-time, is on SSD was evaluated in office today.   Patient is currently stable.  Plan  as noted below.  Plan MDD in remission Wellbutrin 75 mg p.o. daily Trazodone 25 mg p.o. nightly Continue CBT with Ms. Christina Hussami AIMS - 0  PTSD-stable Continue CBT  Insomnia-stable Trazodone 25-50 mg p.o. nightly  Collateral information was obtained from caregiver who was present in session today.  Follow-up in clinic in 2 to 3 months or sooner if needed.  This note was generated in part or whole with voice recognition software. Voice recognition is usually quite accurate but there are transcription errors that can and very often do occur. I apologize for any typographical errors that were not detected and corrected.      Ursula Alert, MD 04/08/2021, 3:18 PM

## 2021-05-06 ENCOUNTER — Ambulatory Visit (INDEPENDENT_AMBULATORY_CARE_PROVIDER_SITE_OTHER): Payer: Medicare Other | Admitting: Licensed Clinical Social Worker

## 2021-05-06 ENCOUNTER — Other Ambulatory Visit: Payer: Self-pay

## 2021-05-06 DIAGNOSIS — F4310 Post-traumatic stress disorder, unspecified: Secondary | ICD-10-CM | POA: Diagnosis not present

## 2021-05-06 NOTE — Plan of Care (Signed)
°  Problem: Reduce the negative impact trauma related symptoms have on social, occupational, and family functioning. Goal: LTG: Reduce frequency, intensity, and duration of PTSD symptoms so daily functioning is improved: Input needed on appropriate metric.  Patient will score less than 5 on the pt self report (1-10) Outcome: Progressing Goal: STG: Practice interpersonal effectiveness skills 7 times per week for the next 16 weeks Outcome: Progressing

## 2021-05-06 NOTE — Progress Notes (Signed)
Virtual Visit via Audio Note  I connected with Teresa Crawford on 05/06/21 at  3:00 PM EST by an audio enabled telemedicine application and verified that I am speaking with the correct person using two identifiers.  Location: Patient: home Provider: remote office [place], [name]   I discussed the limitations of evaluation and management by telemedicine and the availability of in person appointments. The patient expressed understanding and agreed to proceed.  I discussed the assessment and treatment plan with the patient. The patient was provided an opportunity to ask questions and all were answered. The patient agreed with the plan and demonstrated an understanding of the instructions.   The patient was advised to call back or seek an in-person evaluation if the symptoms worsen or if the condition fails to improve as anticipated.  I provided  minutes of non-face-to-face time during this encounter.   Teresa Crawford R Teresa Bolla, LCSW   THERAPIST PROGRESS NOTE  Session Time: 3-4p  Participation Level: Active  Behavioral Response: NeatAlertAnxious and Depressed  Type of Therapy: Individual Therapy  Treatment Goals addressed:  Problem: Reduce the negative impact trauma related symptoms have on social, occupational, and family functioning.  Goal: LTG: Reduce frequency, intensity, and duration of PTSD symptoms so daily functioning is improved: Input needed on appropriate metric.  Patient will score less than 5 on the pt self report (1-10) Outcome: Progressing  Goal: STG: Practice interpersonal effectiveness skills 7 times per week for the next 16 weeks Outcome: Progressing  Interventions:  Intervention: Work with patient to identify the major components of a recent episode of anxiety: physical symptoms, major thoughts and images, and major behaviors they experienced  Intervention: Educate patient on: Stress management  Intervention: Encourage patient to identify  triggers  Intervention: Identify effective coping behavior  Intervention: Work with patient to identify the major components of a recent episode of anxiety: physical symptoms, major thoughts and images, and major behaviors they experienced   Summary: Teresa Crawford is a 36 y.o. female who presents with improving symptoms related to PTSD diagnosis. Pt reports that she has had some good days and bad days. Pt reports that she is compliant with her medication and that she is getting fair quality and quantity of sleep.  Allowed pt to explore and express thoughts and feelings associated with recent life situations and external stressors. Discussed pts current relationship with partner--pros and cons. Pt states that sometimes she is able to identify that she is having negative thoughts about herself.  Discussed ANTS--automatic negative thoughts and encouraged pt to challenge thoughts and at the same time identify a strength.  Helped pt identify some strengths in session.    Pt states that she has lost weight due to having a stomach illness recently. Pt is fearful that she will get eating disorder. Encouraged pt to have healthy eating patterns--no skipping meals or binge eating at meals.   Pt worried about sister's health--liver issues.   Discussed codependence versus independence and pt feels good about where she is right now--pt feels like she has made some positive changes to become more independent.   Reviewed coping mechanisms for managing depression symptoms and anxiety symptoms.   Continued recommendations are as follows: self care behaviors, positive social engagements, focusing on overall work/home/life balance, and focusing on positive physical and emotional wellness.   Suicidal/Homicidal: No  Therapist Response: Pt is continuing to apply interventions learned in session into daily life situations. Pt is currently on track to meet goals utilizing interventions mentioned above. Personal  growth and progress noted. Treatment to continue as indicated.   Plan: Return again in 4 weeks.  Diagnosis: Axis I: PTSD    Axis II: No diagnosis   Teresa Bo Caoilainn Sacks, LCSW 05/06/2021

## 2021-05-07 NOTE — Plan of Care (Signed)
°  Problem: Reduce the negative impact trauma related symptoms have on social, occupational, and family functioning. Goal: LTG: Reduce frequency, intensity, and duration of PTSD symptoms so daily functioning is improved: Input needed on appropriate metric.  Patient will score less than 5 on the pt self report (1-10) Outcome: Progressing Goal: STG: Practice interpersonal effectiveness skills 7 times per week for the next 16 weeks Outcome: Progressing

## 2021-06-13 ENCOUNTER — Other Ambulatory Visit: Payer: Self-pay | Admitting: Nurse Practitioner

## 2021-06-13 DIAGNOSIS — K5903 Drug induced constipation: Secondary | ICD-10-CM

## 2021-06-17 ENCOUNTER — Ambulatory Visit (INDEPENDENT_AMBULATORY_CARE_PROVIDER_SITE_OTHER): Payer: Medicare Other | Admitting: Licensed Clinical Social Worker

## 2021-06-17 ENCOUNTER — Other Ambulatory Visit: Payer: Self-pay

## 2021-06-17 DIAGNOSIS — F4310 Post-traumatic stress disorder, unspecified: Secondary | ICD-10-CM | POA: Diagnosis not present
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
## 2021-06-17 NOTE — Plan of Care (Signed)
?  Problem: Reduce the negative impact trauma related symptoms have on social, occupational, and family functioning. ?Goal: LTG: Reduce frequency, intensity, and duration of PTSD symptoms so daily functioning is improved: Input needed on appropriate metric.  Patient will score less than 5 on the pt self report (1-10) ?Outcome: Progressing ?Goal: STG: Practice interpersonal effectiveness skills 7 times per week for the next 16 weeks ?Outcome: Progressing ?  ?

## 2021-06-24 ENCOUNTER — Ambulatory Visit (INDEPENDENT_AMBULATORY_CARE_PROVIDER_SITE_OTHER): Payer: Medicare Other | Admitting: Nurse Practitioner

## 2021-06-24 ENCOUNTER — Encounter: Payer: Self-pay | Admitting: Nurse Practitioner

## 2021-06-24 ENCOUNTER — Other Ambulatory Visit: Payer: Self-pay

## 2021-06-24 VITALS — BP 121/67 | HR 92 | Temp 98.2°F | Resp 16 | Ht 65.0 in | Wt 191.8 lb

## 2021-06-24 DIAGNOSIS — J4520 Mild intermittent asthma, uncomplicated: Secondary | ICD-10-CM

## 2021-06-24 DIAGNOSIS — R112 Nausea with vomiting, unspecified: Secondary | ICD-10-CM | POA: Diagnosis not present

## 2021-06-24 DIAGNOSIS — L720 Epidermal cyst: Secondary | ICD-10-CM

## 2021-06-24 DIAGNOSIS — Z87892 Personal history of anaphylaxis: Secondary | ICD-10-CM | POA: Diagnosis not present

## 2021-06-24 DIAGNOSIS — J301 Allergic rhinitis due to pollen: Secondary | ICD-10-CM | POA: Diagnosis not present

## 2021-06-24 DIAGNOSIS — G43009 Migraine without aura, not intractable, without status migrainosus: Secondary | ICD-10-CM

## 2021-06-24 DIAGNOSIS — K5903 Drug induced constipation: Secondary | ICD-10-CM

## 2021-06-24 DIAGNOSIS — M542 Cervicalgia: Secondary | ICD-10-CM | POA: Diagnosis not present

## 2021-06-24 DIAGNOSIS — R569 Unspecified convulsions: Secondary | ICD-10-CM

## 2021-06-24 MED ORDER — EPINEPHRINE 0.3 MG/0.3ML IJ SOAJ: INTRAMUSCULAR | 5 refills | Status: DC

## 2021-06-24 MED ORDER — ALBUTEROL SULFATE HFA 108 (90 BASE) MCG/ACT IN AERS: 2.0000 | INHALATION_SPRAY | Freq: Four times a day (QID) | RESPIRATORY_TRACT | 5 refills | Status: DC | PRN

## 2021-06-24 MED ORDER — PROMETHAZINE HCL 12.5 MG PO TABS: 12.5000 mg | ORAL_TABLET | Freq: Three times a day (TID) | ORAL | 0 refills | Status: DC | PRN

## 2021-06-24 MED ORDER — OMEPRAZOLE 40 MG PO CPDR: 40.0000 mg | DELAYED_RELEASE_CAPSULE | Freq: Two times a day (BID) | ORAL | 5 refills | Status: DC

## 2021-06-24 MED ORDER — TOPIRAMATE 50 MG PO TABS: 50.0000 mg | ORAL_TABLET | Freq: Every evening | ORAL | 5 refills | Status: DC

## 2021-06-24 MED ORDER — DOCUSATE SODIUM 100 MG PO CAPS: 100.0000 mg | ORAL_CAPSULE | Freq: Every day | ORAL | 5 refills | Status: DC

## 2021-06-24 MED ORDER — ACETAMINOPHEN 500 MG PO TABS: ORAL_TABLET | ORAL | 5 refills | Status: DC

## 2021-06-24 MED ORDER — CETIRIZINE HCL 10 MG PO TABS: ORAL_TABLET | ORAL | 5 refills | Status: DC

## 2021-06-24 MED ORDER — IBUPROFEN 600 MG PO TABS: ORAL_TABLET | ORAL | 5 refills | Status: DC

## 2021-06-24 NOTE — Progress Notes (Signed)
[facility] ?[address] ?[place], [place] [address] ? ?Internal MEDICINE  ?Office Visit Note ? ?Patient Name: Teresa Crawford ? [id number]  ?[id number] ? ?Date of Service: 06/24/2021 ? ?Chief Complaint  ?Patient presents with  ? Follow-up  ?  3 weeks ago found bump on nap of neck, neck pain right side started 3 weeks ago as well  ? Depression  ? Anxiety  ? Anemia  ? Hypothyroidism  ? Medication Refill  ? ? ?HPI ?Ashleigh presents for follow-up visit with her caregiver for general follow-up and medication refills.  She is also requesting to have the meclizine discontinued and to decrease the frequency of her Colace dose.  She reports having a bump behind her right ear and her hairline that is palpable but not tender or painful.  She reports that she has been drinking more water which has been making her feel better.  She also reports tenderness on the right side of her neck and soreness. ? ? ? ?Current Medication: ?Outpatient Encounter Medications as of 06/24/2021  ?Medication Sig  ? albuterol (VENTOLIN HFA) 108 (90 Base) MCG/ACT inhaler Inhale 2 puffs into the lungs every 6 (six) hours as needed for wheezing or shortness of breath.  ? buPROPion (WELLBUTRIN) 75 MG tablet Take 1 tablet (75 mg total) by mouth daily with breakfast.  ? fluticasone (FLONASE) 50 MCG/ACT nasal spray USE 2 SPRAY INTO EACH NOSTRIL ONCE DAILYFOR CONGESTION OR ALLERGIES  ? fluticasone (FLOVENT HFA) 220 MCG/ACT inhaler INHALE 2 PUFFS INTO THE LUNGS, 2 TIMES PER DAY. RINSE MOUTH AFTER INHALATIONS  ? tamsulosin (FLOMAX) 0.4 MG CAPS capsule   ? traMADol (ULTRAM) 50 MG tablet ONE TAB PO BID PRN FOR PAIN AND HEADCAHES  ? traZODone (DESYREL) 50 MG tablet Take 0.5 tablets (25 mg total) by mouth at bedtime.  ? [DISCONTINUED] acetaminophen (TYLENOL) 500 MG tablet 2 TABS PO BID PRN FOR HEADACHES AND PAINS  ? [DISCONTINUED] albuterol (PROAIR HFA) 108 (90 Base) MCG/ACT inhaler Inhale 2 puffs into the lungs every 6 (six) hours as needed for  wheezing or shortness of breath.  ? [DISCONTINUED] cetirizine (ZYRTEC) 10 MG tablet TAKE 1 TABLET BY MOUTH AT BEDTIME FOR ALLERGIES  ? [DISCONTINUED] docusate sodium (COLACE) 100 MG capsule TAKE 1 CAPSULE BY MOUTH TWICE DAILY FOR STOOL SOFTENER  ? [DISCONTINUED] EPINEPHrine 0.3 mg/0.3 mL IJ SOAJ injection USE FOR ANAPHYLAXIS  ? [DISCONTINUED] ibuprofen (ADVIL) 600 MG tablet ONE TAB PO BID PRN FOR PAIN AND HEADACHES  ? [DISCONTINUED] meclizine (ANTIVERT) 25 MG tablet Take 1 tablet (25 mg total) by mouth 3 (three) times daily as needed for dizziness or nausea.  ? [DISCONTINUED] omeprazole (PRILOSEC) 40 MG capsule Take 1 capsule (40 mg total) by mouth 2 (two) times daily.  ? [DISCONTINUED] promethazine (PHENERGAN) 12.5 MG tablet Take 1 tablet (12.5 mg total) by mouth every 8 (eight) hours as needed for nausea or vomiting.  ? [DISCONTINUED] topiramate (TOPAMAX) 50 MG tablet Take 1 tablet (50 mg total) by mouth every evening.  ? acetaminophen (TYLENOL) 500 MG tablet 2 TABS PO BID PRN FOR HEADACHES AND PAINS  ? cetirizine (ZYRTEC) 10 MG tablet TAKE 1 TABLET BY MOUTH AT BEDTIME FOR ALLERGIES  ? docusate sodium (COLACE) 100 MG capsule Take 1 capsule (100 mg total) by mouth daily. Note decreased frequency to once daily, 30 capsules per month.  ? EPINEPHrine 0.3 mg/0.3 mL IJ SOAJ injection USE FOR ANAPHYLAXIS  ? ibuprofen (ADVIL) 600 MG tablet ONE TAB PO BID PRN FOR PAIN  AND HEADACHES  ? omeprazole (PRILOSEC) 40 MG capsule Take 1 capsule (40 mg total) by mouth 2 (two) times daily.  ? promethazine (PHENERGAN) 12.5 MG tablet Take 1 tablet (12.5 mg total) by mouth every 8 (eight) hours as needed for nausea or vomiting.  ? topiramate (TOPAMAX) 50 MG tablet Take 1 tablet (50 mg total) by mouth every evening.  ? ?No facility-administered encounter medications on file as of 06/24/2021.  ? ? ?Surgical History: ?Past Surgical History:  ?Procedure Laterality Date  ? ABDOMINAL HYSTERECTOMY    ? BACK SURGERY    ? COLONOSCOPY  04/27/2006  ?  Wohl-colitis, internal hemorrhoids  ? COLONOSCOPY  02/2013  ? focal right colitis, left colon biopsy negative, focal active proctitis without chronicity  ? ESOPHAGOGASTRODUODENOSCOPY  02/2013  ? multiple peptic & duodenal ulcers, negative h pylori  ? INTRAUTERINE DEVICE INSERTION  2012  ? LAPAROSCOPIC ENDOMETRIOSIS FULGURATION  2015  ? [name]  ? OVARIAN CYST REMOVAL    ? ? ?Medical History: ?Past Medical History:  ?Diagnosis Date  ? B12 deficiency anemia   ? Chronic back pain   ? Chronic fatigue syndrome   ? Constipation   ? Depression   ? Elevated liver enzymes   ? Endometriosis   ? Fibroids   ? Hepatic steatosis   ? Hypothyroid   ? Last menstrual period (LMP) > 10 days ago 09/12/15  ? Mental retardation   ? Migraine   ? Post traumatic stress disorder   ? Posttraumatic stress disorder   ? PUD (peptic ulcer disease)   ? Scoliosis   ? Splenomegaly   ? Uterine fibroid   ? ? ?Family History: ?Family History  ?Problem Relation Age of Onset  ? Breast cancer Mother 29  ? COPD Father   ? Lung cancer Father 59  ? Dementia Father   ? Diabetes Sister   ? Diabetes Brother   ? Ovarian cancer Paternal Aunt   ? Colon cancer Maternal Grandfather 35  ? Liver disease Maternal Aunt   ?     ?etiology  ? Lymphoma Maternal Grandmother 60  ? Lung cancer Other 19  ?     maternal great grandfather  ? Stomach cancer Other 33  ?     maternal great grandmother  ? ? ?Social History  ? ?Socioeconomic History  ? Marital status: Single  ?  Spouse name: Not on file  ? Number of children: 0  ? Years of education: Not on file  ? Highest education level: Not on file  ?Occupational History  ? Not on file  ?Tobacco Use  ? Smoking status: Never  ? Smokeless tobacco: Never  ?Vaping Use  ? Vaping Use: Never used  ?Substance and Sexual Activity  ? Alcohol use: Not Currently  ?  Alcohol/week: 0.0 standard drinks  ? Drug use: No  ? Sexual activity: Not Currently  ?Other Topics Concern  ? Not on file  ?Social History Narrative  ? She has brother, half brother &  sister live nearby  ? Both parents deceased  ? Care managed by [name]  ? ?Social Determinants of Health  ? ?Financial Resource Strain: Not on file  ?Food Insecurity: Not on file  ?Transportation Needs: Not on file  ?Physical Activity: Not on file  ?Stress: Not on file  ?Social Connections: Not on file  ?Intimate Partner Violence: Not on file  ? ? ? ? ?Review of Systems  ?Constitutional:  Negative for chills, fatigue and unexpected weight change.  ?HENT:  Negative for congestion, rhinorrhea, sneezing and sore throat.   ?Eyes:  Negative for redness.  ?Respiratory:  Negative for cough, chest tightness, shortness of breath and wheezing.   ?Cardiovascular: Negative.  Negative for chest pain and palpitations.  ?Gastrointestinal:  Positive for diarrhea. Negative for abdominal pain, constipation, nausea and vomiting.  ?Genitourinary:  Negative for dysuria and frequency.  ?Musculoskeletal:  Negative for arthralgias, back pain, joint swelling and neck pain.  ?Skin:  Negative for rash.  ?     Lump under skin behind the right ear in her hairline.  ?Neurological: Negative.  Negative for tremors and numbness.  ?Hematological:  Negative for adenopathy. Does not bruise/bleed easily.  ?Psychiatric/Behavioral:  Positive for depression. Negative for behavioral problems (Depression), sleep disturbance and suicidal ideas. The patient is not nervous/anxious.   ? ?Vital Signs: ?BP 121/67   Pulse 92   Temp 98.2 ?F (36.8 ?C)   Resp 16   Ht '5\' 5"'$  (1.651 m)   Wt 191 lb 12.8 oz (87 kg)   LMP 01/05/2016   SpO2 99%   BMI 31.92 kg/m?  ? ? ?Physical Exam ?Constitutional:   ?   General: She is not in acute distress. ?   Appearance: Normal appearance. She is obese. She is not ill-appearing.  ?HENT:  ?   Head: Normocephalic and atraumatic.  ?Eyes:  ?   Pupils: Pupils are equal, round, and reactive to light.  ?Neck:  ?   Trachea: Trachea and phonation normal.  ?   Comments: Generalized tenderness in neck on the right side, small anterior  nodule that may be an enlarged lymph node.  ?Cardiovascular:  ?   Rate and Rhythm: Normal rate and regular rhythm.  ?Pulmonary:  ?   Effort: Pulmonary effort is normal. No respiratory distress.  ?Musculoskeletal:

## 2021-07-17 ENCOUNTER — Emergency Department: Payer: Medicare Other

## 2021-07-17 ENCOUNTER — Other Ambulatory Visit: Payer: Self-pay

## 2021-07-17 ENCOUNTER — Telehealth: Payer: Self-pay

## 2021-07-17 ENCOUNTER — Emergency Department
Admission: EM | Admit: 2021-07-17 | Discharge: 2021-07-17 | Disposition: A | Discharge status: Home / Self Care | Payer: Medicare Other | Attending: Emergency Medicine | Admitting: Emergency Medicine

## 2021-07-17 DIAGNOSIS — R519 Headache, unspecified: Secondary | ICD-10-CM | POA: Insufficient documentation

## 2021-07-17 DIAGNOSIS — Y9301 Activity, walking, marching and hiking: Secondary | ICD-10-CM | POA: Insufficient documentation

## 2021-07-17 DIAGNOSIS — B850 Pediculosis due to Pediculus humanus capitis: Secondary | ICD-10-CM | POA: Diagnosis not present

## 2021-07-17 DIAGNOSIS — R220 Localized swelling, mass and lump, head: Secondary | ICD-10-CM

## 2021-07-17 DIAGNOSIS — R82998 Other abnormal findings in urine: Secondary | ICD-10-CM | POA: Diagnosis not present

## 2021-07-17 DIAGNOSIS — S0990XA Unspecified injury of head, initial encounter: Secondary | ICD-10-CM | POA: Diagnosis not present

## 2021-07-17 DIAGNOSIS — W228XXA Striking against or struck by other objects, initial encounter: Secondary | ICD-10-CM | POA: Insufficient documentation

## 2021-07-17 DIAGNOSIS — S0993XA Unspecified injury of face, initial encounter: Secondary | ICD-10-CM | POA: Diagnosis not present

## 2021-07-17 LAB — URINALYSIS, ROUTINE W REFLEX MICROSCOPIC
Bilirubin Urine: NEGATIVE
Glucose, UA: NEGATIVE mg/dL
Hgb urine dipstick: NEGATIVE
Ketones, ur: NEGATIVE mg/dL
Nitrite: POSITIVE — AB
Protein, ur: 30 mg/dL — AB
Specific Gravity, Urine: 1.029 (ref 1.005–1.030)
pH: 5 (ref 5.0–8.0)

## 2021-07-17 LAB — CBC WITH DIFFERENTIAL/PLATELET
Abs Immature Granulocytes: 0.01 10*3/uL (ref 0.00–0.07)
Basophils Absolute: 0 10*3/uL (ref 0.0–0.1)
Basophils Relative: 0 %
Eosinophils Absolute: 0.5 10*3/uL (ref 0.0–0.5)
Eosinophils Relative: 7 %
HCT: 38.7 % (ref 36.0–46.0)
Hemoglobin: 13 g/dL (ref 12.0–15.0)
Immature Granulocytes: 0 %
Lymphocytes Relative: 19 %
Lymphs Abs: 1.2 10*3/uL (ref 0.7–4.0)
MCH: 30.2 pg (ref 26.0–34.0)
MCHC: 33.6 g/dL (ref 30.0–36.0)
MCV: 89.8 fL (ref 80.0–100.0)
Monocytes Absolute: 0.4 10*3/uL (ref 0.1–1.0)
Monocytes Relative: 7 %
Neutro Abs: 4.2 10*3/uL (ref 1.7–7.7)
Neutrophils Relative %: 67 %
Platelets: 184 10*3/uL (ref 150–400)
RBC: 4.31 MIL/uL (ref 3.87–5.11)
RDW: 13.6 % (ref 11.5–15.5)
WBC: 6.3 10*3/uL (ref 4.0–10.5)
nRBC: 0 % (ref 0.0–0.2)

## 2021-07-17 LAB — BASIC METABOLIC PANEL
Anion gap: 7 (ref 5–15)
BUN: 9 mg/dL (ref 6–20)
CO2: 27 mmol/L (ref 22–32)
Calcium: 9.1 mg/dL (ref 8.9–10.3)
Chloride: 104 mmol/L (ref 98–111)
Creatinine, Ser: 0.66 mg/dL (ref 0.44–1.00)
GFR, Estimated: >60 mL/min (ref >60)
Glucose, Bld: 105 mg/dL — ABNORMAL HIGH (ref 70–99)
Potassium: 3.5 mmol/L (ref 3.5–5.1)
Sodium: 138 mmol/L (ref 135–145)

## 2021-07-17 MED ORDER — MALATHION 0.5 % EX LOTN: TOPICAL_LOTION | CUTANEOUS | 1 refills | Status: DC

## 2021-07-17 MED ORDER — CETIRIZINE HCL 10 MG PO TABS: 10.0000 mg | ORAL_TABLET | Freq: Every day | ORAL | 0 refills | Status: DC

## 2021-07-17 NOTE — ED Provider Notes (Signed)
? ?[facility] ?Provider Note ? ? ? Event Date/Time  ? First MD Initiated Contact with Patient 07/17/21 1140   ?  (approximate) ? ? ?History  ? ?Chief Complaint ?Head Injury ? ? ?HPI ? ?Teresa Crawford is a 36 y.o. female with past medical history of intellectual disability, endometriosis, chronic fatigue syndrome, migraines, depression, and seizures who presents to the ED for head injury.  Patient reports that 2 days ago she was rising to an upright position after walking something into the wall when she hit her head.  She reports hitting just above her left eyebrow, denies losing consciousness.  She states that since then she has been dealing with ongoing headache and facial swelling has spread from the left side of her face into the right side.  She does not take any blood thinners and denies any neck pain.  She has not noticed any associated itching or rash with the swelling.  She denies any difficulty urinating and has not had any nausea, vomiting, vision changes, numbness, or weakness. ?  ? ? ?Physical Exam  ? ?Triage Vital Signs: ?ED Triage Vitals [07/17/21 1134]  ?Enc Vitals Group  ?   BP 137/90  ?   Pulse Rate 73  ?   Resp 17  ?   Temp 98.3 ?F (36.8 ?C)  ?   Temp Source Oral  ?   SpO2 100 %  ?   Weight 186 lb 9.6 oz (84.6 kg)  ?   Height '5\' 5"'$  (1.651 m)  ?   Head Circumference   ?   Peak Flow   ?   Pain Score 9  ?   Pain Loc   ?   Pain Edu?   ?   Excl. in [place]?   ? ? ?Most recent vital signs: ?Vitals:  ? 07/17/21 1134 07/17/21 1506  ?BP: 137/90 128/83  ?Pulse: 73 66  ?Resp: 17 18  ?Temp: 98.3 ?F (36.8 ?C)   ?SpO2: 100% 100%  ? ? ?Constitutional: Alert and oriented. ?Eyes: Conjunctivae are normal. ?Head: Small abrasion over left eyebrow with no lacerations.  Mild diffuse facial edema without overlying erythema but with mild tenderness.  No facial bony step-offs noted.  Severe lice infection noted with scalp erythema and irritation. ?Nose: No congestion/rhinnorhea. ?Mouth/Throat: Mucous  membranes are moist.  ?Neck: No midline cervical spine tenderness to palpation, able to rotate neck to 45 degrees without pain bilaterally. ?Cardiovascular: Normal rate, regular rhythm. Grossly normal heart sounds.  2+ radial pulses bilaterally. ?Respiratory: Normal respiratory effort.  No retractions. Lungs CTAB. ?Gastrointestinal: Soft and nontender. No distention. ?Musculoskeletal: No lower extremity tenderness nor edema.  ?Neurologic:  Normal speech and language. No gross focal neurologic deficits are appreciated. ? ? ? ?ED Results / Procedures / Treatments  ? ?Labs ?(all labs ordered are listed, but only abnormal results are displayed) ?Labs Reviewed  ?BASIC METABOLIC PANEL - Abnormal; Notable for the following components:  ?    Result Value  ? Glucose, Bld 105 (*)   ? All other components within normal limits  ?URINALYSIS, ROUTINE W REFLEX MICROSCOPIC - Abnormal; Notable for the following components:  ? Color, Urine YELLOW (*)   ? APPearance CLOUDY (*)   ? Protein, ur 30 (*)   ? Nitrite POSITIVE (*)   ? Leukocytes,Ua TRACE (*)   ? Bacteria, UA MANY (*)   ? All other components within normal limits  ?CBC WITH DIFFERENTIAL/PLATELET  ? ? ?RADIOLOGY ?CT head images reviewed by  me with no hemorrhage or midline shift, CT facial reviewed by me with no notable facial fracture. ? ?PROCEDURES: ? ?Critical Care performed: No ? ?Procedures ? ? ?MEDICATIONS ORDERED IN ED: ?Medications - No data to display ? ? ?IMPRESSION / MDM / ASSESSMENT AND PLAN / ED COURSE  ?I reviewed the triage vital signs and the nursing notes. ?             ?               ? ?36 y.o. female with past medical history of intellectual disability, endometriosis, chronic fatigue syndrome, migraines, major depressive disorder, and seizures who presents to the ED with facial swelling and ongoing headache 2 days after traumatic head injury. ? ?Differential diagnosis includes, but is not limited to, traumatic intracranial injury, facial fracture, nephrotic  syndrome, electrolyte abnormality, allergic reaction, lice infestation. ? ?Patient nontoxic-appearing and in no acute distress, vital signs are unremarkable and she has a nonfocal neurologic exam.  She has diffuse mild facial edema with no signs of cellulitis, does have severe appearing lice infestation that could be contributing.  Given traumatic injury, we will check CT head and maxillofacial, able to clear cervical spine clinically.  We will screen labs for evidence of nephrotic syndrome, if work-up is unremarkable, suspect mild edema related to lisinopril station versus allergic source. ? ?Imaging of head and face is unremarkable with no traumatic injury.  Labs are also reassuring with no AKI or electrolyte abnormality, CBC without anemia or leukocytosis.  UA does show positive nitrites but without any WBCs or urinary symptoms, we will hold off on treatment for UTI.  Suspect patient's facial swelling is related to severe lice infestation with possible allergic component.  We will treat with prescription shampoo as well as cetirizine for use as needed.  Patient counseled to follow-up with her PCP and to return to the ED for new worsening symptoms, patient agrees with plan. ? ?  ? ? ?FINAL CLINICAL IMPRESSION(S) / ED DIAGNOSES  ? ?Final diagnoses:  ?Facial swelling  ?Lice infested hair  ? ? ? ?Rx / DC Orders  ? ?ED Discharge Orders   ? ?      Ordered  ?  malathion (OVIDE) 0.5 % lotion       ? 07/17/21 1547  ?  cetirizine (ZYRTEC ALLERGY) 10 MG tablet  Daily       ? 07/17/21 1547  ? ?  ?  ? ?  ? ? ? ?Note:  This document was prepared using Dragon voice recognition software and may include unintentional dictation errors. ?  ?Blake Divine, MD ?07/17/21 1550 ? ?

## 2021-07-17 NOTE — ED Triage Notes (Signed)
Pt states she was standing up and hit the top of her head on the wall and since having progressive swelling to the face that started on the right side but now BL. Denies LOC. No redness noted ?

## 2021-07-17 NOTE — Telephone Encounter (Signed)
Pt called that she hits her head on wall 2 days ago and she had knot on her from falling and dizziness and pressure as Teresa Crawford advised go to ED for evaluation  ?

## 2021-07-17 NOTE — ED Notes (Signed)
With pt's permission, this RN updated Benjamine Mola, M[facility] of pt's care group on pt's condition and discharge information.  ?

## 2021-07-18 ENCOUNTER — Telehealth: Payer: Self-pay

## 2021-07-18 MED ORDER — DIPHENHYDRAMINE HCL 25 MG PO TABS
25.0000 mg | ORAL_TABLET | Freq: Four times a day (QID) | ORAL | 1 refills | Status: DC | PRN
Start: 2021-07-18 — End: 2021-07-23

## 2021-07-18 MED ORDER — PREDNISONE 10 MG PO TABS: 10.0000 mg | ORAL_TABLET | ORAL | 1 refills | Status: DC

## 2021-07-18 MED ORDER — PREDNISONE 10 MG PO TABS: ORAL_TABLET | ORAL | 1 refills | Status: DC

## 2021-07-18 NOTE — Telephone Encounter (Signed)
Lvm to schedule ED follow up-Toni 

## 2021-07-18 NOTE — Telephone Encounter (Signed)
Pt went to hospital on 07/17/21 due to having a head injury.  The director Learta Codding of [place] for [facility] called and advised that pt is still having facial swelling that is worse that yesterday and that the Hospital diagnosed patient with a severe case of lice.  She advised that it was so bad that they had to shave her head.  The director stated that the nurse at hospital even recommended that they shave her head.  Teresa Crawford advised that pt was given Zyrtec at hospital and is checking if patient may need something more with the swelling being worse.  She advised pt's right eye was almost swollen shut.  Per Alyssa she sent in Benadryl 25 mg every 6 hrs as needed and also prednisone taper with 18 tabs.  I called Teresa Crawford back and informed her of meds sent to pharmacy. ?

## 2021-07-20 LAB — URINE CULTURE: Culture: >=100000 — AB

## 2021-07-22 ENCOUNTER — Telehealth: Payer: Self-pay

## 2021-07-22 ENCOUNTER — Telehealth (INDEPENDENT_AMBULATORY_CARE_PROVIDER_SITE_OTHER): Payer: Medicare Other | Admitting: Psychiatry

## 2021-07-22 ENCOUNTER — Encounter: Payer: Self-pay | Admitting: Psychiatry

## 2021-07-22 DIAGNOSIS — F4310 Post-traumatic stress disorder, unspecified: Secondary | ICD-10-CM

## 2021-07-22 DIAGNOSIS — F5105 Insomnia due to other mental disorder: Secondary | ICD-10-CM

## 2021-07-22 DIAGNOSIS — F3342 Major depressive disorder, recurrent, in full remission: Secondary | ICD-10-CM | POA: Diagnosis not present

## 2021-07-22 DIAGNOSIS — L299 Pruritus, unspecified: Secondary | ICD-10-CM | POA: Diagnosis not present

## 2021-07-22 MED ORDER — TRAZODONE HCL 50 MG PO TABS: 25.0000 mg | ORAL_TABLET | Freq: Every day | ORAL | 0 refills | Status: DC

## 2021-07-22 MED ORDER — BUPROPION HCL 75 MG PO TABS: 75.0000 mg | ORAL_TABLET | Freq: Every day | ORAL | 0 refills | Status: DC

## 2021-07-22 NOTE — Progress Notes (Signed)
Virtual Visit via Video Note ? ?I connected with Teresa Crawford on 07/22/21 at  3:00 PM EDT by a video enabled telemedicine application and verified that I am speaking with the correct person using two identifiers. ? ?Location ?Provider Location : [facility] ?Patient Location : Home ? ?Participants: Patient , Provider ? ?  ?I discussed the limitations of evaluation and management by telemedicine and the availability of in person appointments. The patient expressed understanding and agreed to proceed. ?  ?I discussed the assessment and treatment plan with the patient. The patient was provided an opportunity to ask questions and all were answered. The patient agreed with the plan and demonstrated an understanding of the instructions. ?  ?The patient was advised to call back or seek an in-person evaluation if the symptoms worsen or if the condition fails to improve as anticipated. ? ? ?[facility] MD OP Progress Note ? ?07/22/2021 3:25 PM ?Teresa Crawford  ?MRN:  [id number] ? ?Chief Complaint:  ?Chief Complaint  ?Patient presents with  ? Follow-up : 36 year old Caucasian female who is single, has a history of depression, PTSD, insomnia, presented for medication management.  ? ?HPI: Teresa Crawford is a 36 year old Caucasian female who is single, lives in [place], has a history of MDD, PTSD, insomnia, bereavement was evaluated by telemedicine today. ? ?Patient today reports she is currently struggling with medical problems-recently evaluated in the emergency department-07/17/2021-for facial swelling.  Later on found out she had lice and was started on Benadryl and prednisone.  She however developed adverse side effects to the prednisone.  She was seen in urgent care today and the prednisone was changed to another medication.  Patient does not remember the name of the medication however believes it is likely hydroxyzine for itching.  She will check and let writer know. ? ?Patient reports although she is anxious about the fact that  she had to shave off her hair because of the infection she is staying hopefull. ? ?Patient denies any significant sadness or crying spells.  Reports sleep is good. ? ?Currently compliant on medications. ? ?Denies any suicidality, homicidality or perceptual disturbances. ? ?Patient denies any other concerns today. ? ?Visit Diagnosis:  ?  ICD-10-CM   ?1. MDD (major depressive disorder), recurrent, in full remission ([place])  F33.42 buPROPion (WELLBUTRIN) 75 MG tablet  ?  ?2. Posttraumatic stress disorder  F43.10   ?  ?3. Insomnia due to mental condition  F51.05 traZODone (DESYREL) 50 MG tablet  ? depression  ?  ? ? ?Past Psychiatric History: Reviewed past psychiatric history from progress note on 08/16/2018. ? ?Past Medical History:  ?Past Medical History:  ?Diagnosis Date  ? B12 deficiency anemia   ? Chronic back pain   ? Chronic fatigue syndrome   ? Constipation   ? Depression   ? Elevated liver enzymes   ? Endometriosis   ? Fibroids   ? Hepatic steatosis   ? Hypothyroid   ? Last menstrual period (LMP) > 10 days ago 09/12/15  ? Mental retardation   ? Migraine   ? Post traumatic stress disorder   ? Posttraumatic stress disorder   ? PUD (peptic ulcer disease)   ? Scoliosis   ? Splenomegaly   ? Uterine fibroid   ?  ?Past Surgical History:  ?Procedure Laterality Date  ? ABDOMINAL HYSTERECTOMY    ? BACK SURGERY    ? COLONOSCOPY  04/27/2006  ? Wohl-colitis, internal hemorrhoids  ? COLONOSCOPY  02/2013  ? focal right colitis, left colon  biopsy negative, focal active proctitis without chronicity  ? ESOPHAGOGASTRODUODENOSCOPY  02/2013  ? multiple peptic & duodenal ulcers, negative h pylori  ? INTRAUTERINE DEVICE INSERTION  2012  ? LAPAROSCOPIC ENDOMETRIOSIS FULGURATION  2015  ? [name]  ? OVARIAN CYST REMOVAL    ? ? ?Family Psychiatric History: Reviewed family psychiatric history from progress note on 08/16/2018. ? ?Family History:  ?Family History  ?Problem Relation Age of Onset  ? Breast cancer Mother 84  ? COPD Father   ? Lung  cancer Father 78  ? Dementia Father   ? Diabetes Sister   ? Diabetes Brother   ? Ovarian cancer Paternal Aunt   ? Colon cancer Maternal Grandfather 24  ? Liver disease Maternal Aunt   ?     ?etiology  ? Lymphoma Maternal Grandmother 60  ? Lung cancer Other 63  ?     maternal great grandfather  ? Stomach cancer Other 24  ?     maternal great grandmother  ? ? ?Social History: Reviewed social history from progress note on 08/16/2018. ?Social History  ? ?Socioeconomic History  ? Marital status: Single  ?  Spouse name: Not on file  ? Number of children: 0  ? Years of education: Not on file  ? Highest education level: Not on file  ?Occupational History  ? Not on file  ?Tobacco Use  ? Smoking status: Never  ? Smokeless tobacco: Never  ?Vaping Use  ? Vaping Use: Never used  ?Substance and Sexual Activity  ? Alcohol use: Not Currently  ?  Alcohol/week: 0.0 standard drinks  ? Drug use: No  ? Sexual activity: Not Currently  ?Other Topics Concern  ? Not on file  ?Social History Narrative  ? She has brother, half brother & sister live nearby  ? Both parents deceased  ? Care managed by [name]  ? ?Social Determinants of Health  ? ?Financial Resource Strain: Not on file  ?Food Insecurity: Not on file  ?Transportation Needs: Not on file  ?Physical Activity: Not on file  ?Stress: Not on file  ?Social Connections: Not on file  ? ? ?Allergies:  ?Allergies  ?Allergen Reactions  ? Other Anaphylaxis  ?  Peanut butter, uses Epi pen  ? Peanut Butter Flavor Swelling  ?  Throat swells ?Throat swells  ? Sertraline Other (See Comments)  ?  Panic attacks  ? Codeine Nausea And Vomiting  ?  nausea  ? Paroxetine Other (See Comments)  ?  Panic Attack ?  ? Paroxetine Hcl Other (See Comments)  ?  Panic attack ?Other reaction(s): Other (See Comments) ?Panic attacks  ? Prednisone & Diphenhydramine Rash  ? Venlafaxine Other (See Comments)  ?  Panic Attack ?  ? Zoloft [Sertraline Hcl] Other (See Comments)  ?  Panic attack  ? ? ?Metabolic Disorder  Labs: ?Lab Results  ?Component Value Date  ? HGBA1C 5.5 12/20/2019  ? ?No results found for: PROLACTIN ?Lab Results  ?Component Value Date  ? CHOL 152 12/27/2020  ? TRIG 93 12/27/2020  ? HDL 35 (L) 12/27/2020  ? CHOLHDL 4.3 12/27/2020  ? [place] 99 12/27/2020  ? LDLCALC 114 (H) 12/20/2019  ? ?Lab Results  ?Component Value Date  ? TSH 1.750 12/27/2020  ? TSH 3.820 12/20/2019  ? ? ?Therapeutic Level Labs: ?No results found for: LITHIUM ?No results found for: VALPROATE ?No components found for:  CBMZ ? ?Current Medications: ?Current Outpatient Medications  ?Medication Sig Dispense Refill  ? acetaminophen (TYLENOL) 500 MG tablet  2 TABS PO BID PRN FOR HEADACHES AND PAINS 45 tablet 5  ? albuterol (VENTOLIN HFA) 108 (90 Base) MCG/ACT inhaler Inhale 2 puffs into the lungs every 6 (six) hours as needed for wheezing or shortness of breath. 8 g 5  ? cetirizine (ZYRTEC ALLERGY) 10 MG tablet Take 1 tablet (10 mg total) by mouth daily. 30 tablet 0  ? docusate sodium (COLACE) 100 MG capsule Take 1 capsule (100 mg total) by mouth daily. Note decreased frequency to once daily, 30 capsules per month. 30 capsule 5  ? EPINEPHrine 0.3 mg/0.3 mL IJ SOAJ injection USE FOR ANAPHYLAXIS 1 each 5  ? fluticasone (FLONASE) 50 MCG/ACT nasal spray USE 2 SPRAY INTO EACH NOSTRIL ONCE DAILYFOR CONGESTION OR ALLERGIES 16 g 5  ? fluticasone (FLOVENT HFA) 220 MCG/ACT inhaler INHALE 2 PUFFS INTO THE LUNGS, 2 TIMES PER DAY. RINSE MOUTH AFTER INHALATIONS 12 g 11  ? ibuprofen (ADVIL) 600 MG tablet ONE TAB PO BID PRN FOR PAIN AND HEADACHES 30 tablet 5  ? malathion (OVIDE) 0.5 % lotion Apply to hair and scalp and leave in place for 8 hours before washing. Repeat in 7 days. 60 mL 1  ? omeprazole (PRILOSEC) 40 MG capsule Take 1 capsule (40 mg total) by mouth 2 (two) times daily. 60 capsule 5  ? promethazine (PHENERGAN) 12.5 MG tablet Take 1 tablet (12.5 mg total) by mouth every 8 (eight) hours as needed for nausea or vomiting. 20 tablet 0  ? tamsulosin  (FLOMAX) 0.4 MG CAPS capsule     ? topiramate (TOPAMAX) 50 MG tablet Take 1 tablet (50 mg total) by mouth every evening. 30 tablet 5  ? traMADol (ULTRAM) 50 MG tablet ONE TAB PO BID PRN FOR PAIN AND HEADCAHES 20 tab

## 2021-07-22 NOTE — Telephone Encounter (Signed)
Patient called and said prednisone is causing severe breakouts. Advised patient to go to urgent care or the ED. Also advised patient to stop taking the prednisone. ?

## 2021-07-23 ENCOUNTER — Ambulatory Visit (INDEPENDENT_AMBULATORY_CARE_PROVIDER_SITE_OTHER): Payer: Medicare Other | Admitting: Nurse Practitioner

## 2021-07-23 ENCOUNTER — Encounter: Payer: Self-pay | Admitting: Nurse Practitioner

## 2021-07-23 VITALS — BP 110/84 | HR 94 | Temp 98.8°F | Resp 16 | Ht 64.0 in | Wt 190.0 lb

## 2021-07-23 DIAGNOSIS — L2389 Allergic contact dermatitis due to other agents: Secondary | ICD-10-CM | POA: Diagnosis not present

## 2021-07-23 MED ORDER — TRIAMCINOLONE ACETONIDE 0.1 % EX CREA: 1.0000 "application " | TOPICAL_CREAM | Freq: Two times a day (BID) | CUTANEOUS | 2 refills | Status: DC | PRN

## 2021-07-23 NOTE — Progress Notes (Signed)
[facility] ?[address] ?[place], [place] [address] ? ?Internal MEDICINE  ?Office Visit Note ? ?Patient Name: Teresa Crawford ? [id number]  ?[id number] ? ?Date of Service: 07/23/2021 ? ?Chief Complaint  ?Patient presents with  ? Follow-up  ?  Swelling on face due to infection in scalp from lice, medication caused pt to break out all over body, itchy, not painful  ? ? ?HPI ?Chantella presents for follow-up visit for swelling and rash on face.  Patient was recently treated for lice in the ER and her head was shaved.  The lice infestation has been resolved.  Due to the swelling and itching from a cellulitis type reaction to the lice spreading to her face, the patient was prescribed a prednisone taper and encouraged to take Benadryl for the itching.  She had some sort of hypersensitivity reaction causing an itching rash and facial swelling. ? ? ? ?Current Medication: ?Outpatient Encounter Medications as of 07/23/2021  ?Medication Sig  ? acetaminophen (TYLENOL) 500 MG tablet 2 TABS PO BID PRN FOR HEADACHES AND PAINS  ? albuterol (VENTOLIN HFA) 108 (90 Base) MCG/ACT inhaler Inhale 2 puffs into the lungs every 6 (six) hours as needed for wheezing or shortness of breath.  ? buPROPion (WELLBUTRIN) 75 MG tablet Take 1 tablet (75 mg total) by mouth daily with breakfast.  ? cetirizine (ZYRTEC ALLERGY) 10 MG tablet Take 1 tablet (10 mg total) by mouth daily.  ? docusate sodium (COLACE) 100 MG capsule Take 1 capsule (100 mg total) by mouth daily. Note decreased frequency to once daily, 30 capsules per month.  ? EPINEPHrine 0.3 mg/0.3 mL IJ SOAJ injection USE FOR ANAPHYLAXIS  ? fluticasone (FLONASE) 50 MCG/ACT nasal spray USE 2 SPRAY INTO EACH NOSTRIL ONCE DAILYFOR CONGESTION OR ALLERGIES  ? fluticasone (FLOVENT HFA) 220 MCG/ACT inhaler INHALE 2 PUFFS INTO THE LUNGS, 2 TIMES PER DAY. RINSE MOUTH AFTER INHALATIONS  ? hydrOXYzine (ATARAX) 25 MG tablet Take by mouth.  ? ibuprofen (ADVIL) 600 MG tablet ONE TAB PO BID PRN FOR  PAIN AND HEADACHES  ? malathion (OVIDE) 0.5 % lotion Apply to hair and scalp and leave in place for 8 hours before washing. Repeat in 7 days.  ? omeprazole (PRILOSEC) 40 MG capsule Take 1 capsule (40 mg total) by mouth 2 (two) times daily.  ? promethazine (PHENERGAN) 12.5 MG tablet Take 1 tablet (12.5 mg total) by mouth every 8 (eight) hours as needed for nausea or vomiting.  ? tamsulosin (FLOMAX) 0.4 MG CAPS capsule   ? topiramate (TOPAMAX) 50 MG tablet Take 1 tablet (50 mg total) by mouth every evening.  ? traMADol (ULTRAM) 50 MG tablet ONE TAB PO BID PRN FOR PAIN AND HEADCAHES  ? traZODone (DESYREL) 50 MG tablet Take 0.5 tablets (25 mg total) by mouth at bedtime.  ? triamcinolone cream (KENALOG) 0.1 % Apply 1 application. topically 2 (two) times daily as needed (itching and rash).  ? [DISCONTINUED] diphenhydrAMINE (BENADRYL) 25 MG tablet Take 1 tablet (25 mg total) by mouth every 6 (six) hours as needed for itching. (Patient not taking: Reported on 07/22/2021)  ? [DISCONTINUED] predniSONE (DELTASONE) 10 MG tablet take one tab 3 X day for 3 days, then take one tab 2 X a day for 3 days and then take one tab a day for 3 days   18 tabs (Patient not taking: Reported on 07/22/2021)  ? ?No facility-administered encounter medications on file as of 07/23/2021.  ? ? ?Surgical History: ?Past Surgical History:  ?Procedure  Laterality Date  ? ABDOMINAL HYSTERECTOMY    ? BACK SURGERY    ? COLONOSCOPY  04/27/2006  ? Wohl-colitis, internal hemorrhoids  ? COLONOSCOPY  02/2013  ? focal right colitis, left colon biopsy negative, focal active proctitis without chronicity  ? ESOPHAGOGASTRODUODENOSCOPY  02/2013  ? multiple peptic & duodenal ulcers, negative h pylori  ? INTRAUTERINE DEVICE INSERTION  2012  ? LAPAROSCOPIC ENDOMETRIOSIS FULGURATION  2015  ? [name]  ? OVARIAN CYST REMOVAL    ? ? ?Medical History: ?Past Medical History:  ?Diagnosis Date  ? B12 deficiency anemia   ? Chronic back pain   ? Chronic fatigue syndrome   ? Constipation    ? Depression   ? Elevated liver enzymes   ? Endometriosis   ? Fibroids   ? Hepatic steatosis   ? Hypothyroid   ? Last menstrual period (LMP) > 10 days ago 09/12/15  ? Mental retardation   ? Migraine   ? Post traumatic stress disorder   ? Posttraumatic stress disorder   ? PUD (peptic ulcer disease)   ? Scoliosis   ? Splenomegaly   ? Uterine fibroid   ? ? ?Family History: ?Family History  ?Problem Relation Age of Onset  ? Breast cancer Mother 24  ? COPD Father   ? Lung cancer Father 30  ? Dementia Father   ? Diabetes Sister   ? Diabetes Brother   ? Ovarian cancer Paternal Aunt   ? Colon cancer Maternal Grandfather 31  ? Liver disease Maternal Aunt   ?     ?etiology  ? Lymphoma Maternal Grandmother 60  ? Lung cancer Other 63  ?     maternal great grandfather  ? Stomach cancer Other 35  ?     maternal great grandmother  ? ? ?Social History  ? ?Socioeconomic History  ? Marital status: Single  ?  Spouse name: Not on file  ? Number of children: 0  ? Years of education: Not on file  ? Highest education level: Not on file  ?Occupational History  ? Not on file  ?Tobacco Use  ? Smoking status: Never  ? Smokeless tobacco: Never  ?Vaping Use  ? Vaping Use: Never used  ?Substance and Sexual Activity  ? Alcohol use: Not Currently  ?  Alcohol/week: 0.0 standard drinks  ? Drug use: No  ? Sexual activity: Not Currently  ?Other Topics Concern  ? Not on file  ?Social History Narrative  ? She has brother, half brother & sister live nearby  ? Both parents deceased  ? Care managed by [name]  ? ?Social Determinants of Health  ? ?Financial Resource Strain: Not on file  ?Food Insecurity: Not on file  ?Transportation Needs: Not on file  ?Physical Activity: Not on file  ?Stress: Not on file  ?Social Connections: Not on file  ?Intimate Partner Violence: Not on file  ? ? ? ? ?Review of Systems  ?Constitutional:  Negative for chills, fatigue and unexpected weight change.  ?HENT:  Negative for congestion, rhinorrhea, sneezing and sore throat.    ?Eyes:  Negative for redness.  ?Respiratory: Negative.  Negative for cough, chest tightness, shortness of breath and wheezing.   ?Cardiovascular: Negative.  Negative for chest pain and palpitations.  ?Gastrointestinal:  Negative for abdominal pain, constipation, diarrhea, nausea and vomiting.  ?Genitourinary:  Negative for dysuria and frequency.  ?Musculoskeletal:  Negative for arthralgias, back pain, joint swelling and neck pain.  ?Skin:  Positive for rash.  ?  Itching and facial swelling.  ?Neurological: Negative.  Negative for tremors and numbness.  ?Psychiatric/Behavioral:  Negative for behavioral problems (Depression), sleep disturbance and suicidal ideas. The patient is not nervous/anxious.   ? ?Vital Signs: ?BP 110/84   Pulse 94   Temp 98.8 ?F (37.1 ?C)   Resp 16   Ht '5\' 4"'$  (1.626 m)   Wt 190 lb (86.2 kg)   LMP 01/05/2016   SpO2 99%   BMI 32.61 kg/m?  ? ? ?Physical Exam ?Vitals reviewed.  ?Constitutional:   ?   Appearance: Normal appearance. She is obese.  ?HENT:  ?   Head: Normocephalic and atraumatic.  ?Eyes:  ?   Pupils: Pupils are equal, round, and reactive to light.  ?Cardiovascular:  ?   Rate and Rhythm: Normal rate and regular rhythm.  ?Pulmonary:  ?   Effort: Pulmonary effort is normal. No respiratory distress.  ?Skin: ?   Findings: Erythema (facial) and rash (facial with itching) present.  ?Neurological:  ?   Mental Status: She is alert and oriented to person, place, and time.  ?Psychiatric:     ?   Mood and Affect: Mood normal.     ?   Behavior: Behavior normal.  ? ? ? ? ? ?Assessment/Plan: ?1. Allergic contact dermatitis due to other agents ?May take hydroxyzine as needed for itching and may apply triamcinolone cream to decreasing itching and inflammation. ?- hydrOXYzine (ATARAX) 25 MG tablet; Take by mouth. ?- triamcinolone cream (KENALOG) 0.1 %; Apply 1 application. topically 2 (two) times daily as needed (itching and rash).  Dispense: 45 g; Refill: 2 ? ? ?General Counseling: debbora ang understanding of the findings of todays visit and agrees with plan of treatment. I have discussed any further diagnostic evaluation that may be needed or ordered today. We also reviewed her medicat

## 2021-08-05 ENCOUNTER — Ambulatory Visit (INDEPENDENT_AMBULATORY_CARE_PROVIDER_SITE_OTHER): Payer: Medicare Other | Admitting: Licensed Clinical Social Worker

## 2021-08-05 DIAGNOSIS — F4310 Post-traumatic stress disorder, unspecified: Secondary | ICD-10-CM | POA: Diagnosis not present

## 2021-08-05 NOTE — Progress Notes (Signed)
?Virtual Visit via Video Note ? ?I connected with [name] on 08/05/21 at  2:00 PM EDT by a video enabled telemedicine application and verified that I am speaking with the correct person using two identifiers. ? ?Location: ?Patient: home ?Provider: remote office [place], [name] ?  ?I discussed the limitations of evaluation and management by telemedicine and the availability of in person appointments. The patient expressed understanding and agreed to proceed. ? ?I discussed the assessment and treatment plan with the patient. The patient was provided an opportunity to ask questions and all were answered. The patient agreed with the plan and demonstrated an understanding of the instructions. ?  ?The patient was advised to call back or seek an in-person evaluation if the symptoms worsen or if the condition fails to improve as anticipated. ? ?I provided  60 minutes of non-face-to-face time during this encounter. ? ? ?Teresa Crawford R Teresa Kelner, LCSW ? ? ?THERAPIST PROGRESS NOTE ? ?Session Time: 2-3p ? ?Participation Level: Active ? ?Behavioral Response: NeatAlertAnxious and Depressed ? ?Type of Therapy: Individual Therapy ? ?ProgressTowards Goals: Progressing ? ?Treatment Goals addressed: Problem: Reduce the negative impact trauma related symptoms have on social, occupational, and family functioning. ?Goal: LTG: Reduce frequency, intensity, and duration of PTSD symptoms so daily functioning is improved: Input needed on appropriate metric.  Patient will score less than 5 on the pt self report (1-10) ?Outcome: Progressing ?Goal: STG: Practice interpersonal effectiveness skills 7 times per week for the next 16 weeks ?Outcome: Progressing ?  ? ?Interventions:  ?Intervention: Work with patient to identify the major components of a recent episode of anxiety: physical symptoms, major thoughts and images, and major behaviors they experienced ? ?Intervention: Educate patient on: Stress management ? ?Intervention: Encourage  patient to identify triggers ? ?Intervention: Identify effective coping behavior ? ?Intervention: Work with patient to identify the major components of a recent episode of anxiety: physical symptoms, major thoughts and images, and major behaviors they experienced ?  ?Summary: Teresa Crawford is a 36 y.o. female who presents with improving symptoms related to PTSD diagnosis. Pt reports that overall mood has been stable and that she is managing situational stressors well. Pt reports that she is compliant with her medication and that she is getting fair quality and quantity of sleep. ? ?Allowed pt to explore and express thoughts and feelings associated with recent life situations and external stressors. Patient reports that she is still in recovery mode after a recent illness. Patient reports that she hit her head really hard and ended up in the emergency room. Patient reports that while she was in the emergency room they discovered a scalp infection, and had to shave patients hair. Allowed patient to explore the overall psychological impact of having her hair shaved. Patient was wearing a wig at time of session, and states that she enjoys wearing the wig. ? ?Explored current relationship with boyfriend--patient reports that she is concerned that they are not talking as much as they were initially. Allowed patient to explore good things about the relationship, and concerns that she has about the relationship. Discussed communication, and encouraged communication by both parties. Patient reports that she found out that her ex-boyfriend and his girlfriend broke up, and patient still feels that they have a psychic connection, and may possibly get back together. Patient believes this, even though they have not had a verbal conversation in over two years. ? ?Reviewed coping mechanisms for managing depression symptoms and anxiety symptoms.  ? ?Continued recommendations are as follows:  self care behaviors, positive social  engagements, focusing on overall work/home/life balance, and focusing on positive physical and emotional wellness.  ? ?Suicidal/Homicidal: No ? ?Therapist Response: Pt is continuing to apply interventions learned in session into daily life situations. Pt is currently on track to meet goals utilizing interventions mentioned above. Personal growth and progress noted. Treatment to continue as indicated.  ? ? ?Plan: Return again in 4 weeks. ? ?Encounter Diagnosis  ?Name Primary?  ? Posttraumatic stress disorder Yes  ? ? ?Collaboration of Care: Other pt encouraged to continue care with psychiatrist of record, Dr. Shea Evans ? ?Patient/Guardian was advised Release of Information must be obtained prior to any record release in order to collaborate their care with an outside provider. Patient/Guardian was advised if they have not already done so to contact the registration department to sign all necessary forms in order for [place] to release information regarding their care.  ? ?Consent: Patient/Guardian gives verbal consent for treatment and assignment of benefits for services provided during this visit. Patient/Guardian expressed understanding and agreed to proceed.  ? ? ?Teresa Crawford R Czarina Gingras, LCSW ?08/05/2021 ? ?

## 2021-08-05 NOTE — Plan of Care (Signed)
?  Problem: Reduce the negative impact trauma related symptoms have on social, occupational, and family functioning. ?Goal: LTG: Reduce frequency, intensity, and duration of PTSD symptoms so daily functioning is improved: Input needed on appropriate metric.  Patient will score less than 5 on the pt self report (1-10) ?Outcome: Progressing ?Goal: STG: Practice interpersonal effectiveness skills 7 times per week for the next 16 weeks ?Outcome: Progressing ?  ?

## 2021-08-06 ENCOUNTER — Other Ambulatory Visit: Payer: Medicare Other

## 2021-08-10 ENCOUNTER — Encounter: Payer: Self-pay | Admitting: Nurse Practitioner

## 2021-08-12 ENCOUNTER — Ambulatory Visit: Payer: Medicare Other | Admitting: Nurse Practitioner

## 2021-08-15 ENCOUNTER — Telehealth: Payer: Self-pay

## 2021-08-15 NOTE — Telephone Encounter (Signed)
Patient lvm to schedule appointment with provider same day as u/s. She did not answer when I called her back. Lvm for her to return call Monday morning-Toni ?

## 2021-08-20 ENCOUNTER — Ambulatory Visit (INDEPENDENT_AMBULATORY_CARE_PROVIDER_SITE_OTHER): Payer: Medicare Other

## 2021-08-20 DIAGNOSIS — M542 Cervicalgia: Secondary | ICD-10-CM

## 2021-08-28 ENCOUNTER — Ambulatory Visit: Payer: Medicare Other | Admitting: Nurse Practitioner

## 2021-09-03 ENCOUNTER — Telehealth: Payer: Self-pay

## 2021-09-03 ENCOUNTER — Ambulatory Visit (INDEPENDENT_AMBULATORY_CARE_PROVIDER_SITE_OTHER): Payer: Medicare Other | Admitting: Nurse Practitioner

## 2021-09-03 ENCOUNTER — Encounter: Payer: Self-pay | Admitting: Nurse Practitioner

## 2021-09-03 VITALS — BP 102/49 | HR 82 | Temp 98.3°F | Resp 16 | Ht 63.0 in | Wt 189.4 lb

## 2021-09-03 DIAGNOSIS — L720 Epidermal cyst: Secondary | ICD-10-CM | POA: Diagnosis not present

## 2021-09-03 DIAGNOSIS — G43009 Migraine without aura, not intractable, without status migrainosus: Secondary | ICD-10-CM | POA: Diagnosis not present

## 2021-09-03 DIAGNOSIS — J301 Allergic rhinitis due to pollen: Secondary | ICD-10-CM | POA: Diagnosis not present

## 2021-09-03 DIAGNOSIS — L2389 Allergic contact dermatitis due to other agents: Secondary | ICD-10-CM

## 2021-09-03 DIAGNOSIS — J4520 Mild intermittent asthma, uncomplicated: Secondary | ICD-10-CM

## 2021-09-03 DIAGNOSIS — R569 Unspecified convulsions: Secondary | ICD-10-CM

## 2021-09-03 MED ORDER — FLUTICASONE PROPIONATE HFA 220 MCG/ACT IN AERO: INHALATION_SPRAY | RESPIRATORY_TRACT | 11 refills | Status: DC

## 2021-09-03 MED ORDER — TOPIRAMATE 50 MG PO TABS: 50.0000 mg | ORAL_TABLET | Freq: Every evening | ORAL | 5 refills | Status: DC

## 2021-09-03 MED ORDER — ACETAMINOPHEN 500 MG PO TABS: ORAL_TABLET | ORAL | 5 refills | Status: DC

## 2021-09-03 MED ORDER — TRIAMCINOLONE ACETONIDE 0.1 % EX CREA: 1.0000 "application " | TOPICAL_CREAM | Freq: Two times a day (BID) | CUTANEOUS | 2 refills | Status: DC | PRN

## 2021-09-03 MED ORDER — CETIRIZINE HCL 10 MG PO TABS: 10.0000 mg | ORAL_TABLET | Freq: Every day | ORAL | 1 refills | Status: DC

## 2021-09-03 MED ORDER — OMEPRAZOLE 40 MG PO CPDR: 40.0000 mg | DELAYED_RELEASE_CAPSULE | Freq: Two times a day (BID) | ORAL | 5 refills | Status: DC

## 2021-09-03 MED ORDER — FLUTICASONE PROPIONATE 50 MCG/ACT NA SUSP: 1.0000 | Freq: Every day | NASAL | 5 refills | Status: DC

## 2021-09-03 MED ORDER — IBUPROFEN 600 MG PO TABS: ORAL_TABLET | ORAL | 5 refills | Status: DC

## 2021-09-03 NOTE — Progress Notes (Signed)
[facility] [place], [place] [address]  Internal MEDICINE  Office Visit Note  Patient Name: Teresa Crawford  [id number]  [id number]  Date of Service: 09/03/2021  Chief Complaint  Patient presents with   Follow-up    Dermatologist referral   Depression   Anxiety   Anemia   Results    Depression        Associated symptoms include no fatigue and no suicidal ideas.  Past medical history includes anxiety.   Anxiety Patient reports no chest pain, nausea, nervous/anxious behavior, palpitations, shortness of breath or suicidal ideas.   Her past medical history is significant for anemia.  Anemia There has been no abdominal pain or palpitations.  Shaeley presents for a follow up visit for Rash, itchy, red spots, nonresolving on torso after possible allergic skin reaction to prednisone and/or benadryl Dermatology referral requested.  Need refills of medications    Current Medication: Outpatient Encounter Medications as of 09/03/2021  Medication Sig   albuterol (VENTOLIN HFA) 108 (90 Base) MCG/ACT inhaler Inhale 2 puffs into the lungs every 6 (six) hours as needed for wheezing or shortness of breath.   buPROPion (WELLBUTRIN) 75 MG tablet Take 1 tablet (75 mg total) by mouth daily with breakfast.   docusate sodium (COLACE) 100 MG capsule Take 1 capsule (100 mg total) by mouth daily. Note decreased frequency to once daily, 30 capsules per month.   EPINEPHrine 0.3 mg/0.3 mL IJ SOAJ injection USE FOR ANAPHYLAXIS   malathion (OVIDE) 0.5 % lotion Apply to hair and scalp and leave in place for 8 hours before washing. Repeat in 7 days.   promethazine (PHENERGAN) 12.5 MG tablet Take 1 tablet (12.5 mg total) by mouth every 8 (eight) hours as needed for nausea or vomiting.   tamsulosin (FLOMAX) 0.4 MG CAPS capsule    traMADol (ULTRAM) 50 MG tablet ONE TAB PO BID PRN FOR PAIN AND HEADCAHES   traZODone (DESYREL) 50 MG tablet Take 0.5 tablets (25 mg total) by mouth at  bedtime.   [DISCONTINUED] acetaminophen (TYLENOL) 500 MG tablet 2 TABS PO BID PRN FOR HEADACHES AND PAINS   [DISCONTINUED] fluticasone (FLONASE) 50 MCG/ACT nasal spray USE 2 SPRAY INTO EACH NOSTRIL ONCE DAILYFOR CONGESTION OR ALLERGIES   [DISCONTINUED] fluticasone (FLOVENT HFA) 220 MCG/ACT inhaler INHALE 2 PUFFS INTO THE LUNGS, 2 TIMES PER DAY. RINSE MOUTH AFTER INHALATIONS   [DISCONTINUED] ibuprofen (ADVIL) 600 MG tablet ONE TAB PO BID PRN FOR PAIN AND HEADACHES   [DISCONTINUED] omeprazole (PRILOSEC) 40 MG capsule Take 1 capsule (40 mg total) by mouth 2 (two) times daily.   [DISCONTINUED] topiramate (TOPAMAX) 50 MG tablet Take 1 tablet (50 mg total) by mouth every evening.   [DISCONTINUED] triamcinolone cream (KENALOG) 0.1 % Apply 1 application. topically 2 (two) times daily as needed (itching and rash).   acetaminophen (TYLENOL) 500 MG tablet 2 TABS PO BID PRN FOR HEADACHES AND PAINS   cetirizine (ZYRTEC ALLERGY) 10 MG tablet Take 1 tablet (10 mg total) by mouth daily.   fluticasone (FLONASE) 50 MCG/ACT nasal spray Place 1 spray into both nostrils daily.   fluticasone (FLOVENT HFA) 220 MCG/ACT inhaler INHALE 2 PUFFS INTO THE LUNGS, 2 TIMES PER DAY. RINSE MOUTH AFTER INHALATIONS   ibuprofen (ADVIL) 600 MG tablet ONE TAB PO BID PRN FOR PAIN AND HEADACHES   omeprazole (PRILOSEC) 40 MG capsule Take 1 capsule (40 mg total) by mouth 2 (two) times daily.   topiramate (TOPAMAX) 50 MG tablet Take 1 tablet (50  mg total) by mouth every evening.   triamcinolone cream (KENALOG) 0.1 % Apply 1 application. topically 2 (two) times daily as needed (itching and rash).   [DISCONTINUED] cetirizine (ZYRTEC ALLERGY) 10 MG tablet Take 1 tablet (10 mg total) by mouth daily.   No facility-administered encounter medications on file as of 09/03/2021.    Surgical History: Past Surgical History:  Procedure Laterality Date   ABDOMINAL HYSTERECTOMY     BACK SURGERY     COLONOSCOPY  04/27/2006   Wohl-colitis, internal  hemorrhoids   COLONOSCOPY  02/2013   focal right colitis, left colon biopsy negative, focal active proctitis without chronicity   ESOPHAGOGASTRODUODENOSCOPY  02/2013   multiple peptic & duodenal ulcers, negative h pylori   INTRAUTERINE DEVICE INSERTION  2012   LAPAROSCOPIC ENDOMETRIOSIS FULGURATION  2015   [name]   OVARIAN CYST REMOVAL      Medical History: Past Medical History:  Diagnosis Date   B12 deficiency anemia    Chronic back pain    Chronic fatigue syndrome    Constipation    Depression    Elevated liver enzymes    Endometriosis    Fibroids    Hepatic steatosis    Hypothyroid    Last menstrual period (LMP) > 10 days ago 09/12/15   Mental retardation    Migraine    Post traumatic stress disorder    Posttraumatic stress disorder    PUD (peptic ulcer disease)    Scoliosis    Splenomegaly    Uterine fibroid     Family History: Family History  Problem Relation Age of Onset   Breast cancer Mother 62   COPD Father    Lung cancer Father 67   Dementia Father    Diabetes Sister    Diabetes Brother    Ovarian cancer Paternal Aunt    Colon cancer Maternal Grandfather 66   Liver disease Maternal Aunt        ?etiology   Lymphoma Maternal Grandmother 47   Lung cancer Other 73       maternal great grandfather   Stomach cancer Other 37       maternal great grandmother    Social History   Socioeconomic History   Marital status: Single    Spouse name: Not on file   Number of children: 0   Years of education: Not on file   Highest education level: Not on file  Occupational History   Not on file  Tobacco Use   Smoking status: Never   Smokeless tobacco: Never  Vaping Use   Vaping Use: Never used  Substance and Sexual Activity   Alcohol use: Not Currently    Alcohol/week: 0.0 standard drinks   Drug use: No   Sexual activity: Not Currently  Other Topics Concern   Not on file  Social History Narrative   She has brother, half brother & sister live nearby    Both parents deceased   Care managed by [name]   Social Determinants of Health   Financial Resource Strain: Not on file  Food Insecurity: Not on file  Transportation Needs: Not on file  Physical Activity: Not on file  Stress: Not on file  Social Connections: Not on file  Intimate Partner Violence: Not on file      Review of Systems  Constitutional:  Negative for chills, fatigue and unexpected weight change.  HENT:  Negative for congestion, rhinorrhea, sneezing and sore throat.   Eyes:  Negative for redness.  Respiratory: Negative.  Negative for cough, chest tightness, shortness of breath and wheezing.   Cardiovascular: Negative.  Negative for chest pain and palpitations.  Gastrointestinal:  Negative for abdominal pain, constipation, diarrhea, nausea and vomiting.  Genitourinary:  Negative for dysuria and frequency.  Musculoskeletal:  Negative for arthralgias, back pain, joint swelling and neck pain.  Skin:  Positive for rash.       Itching and facial swelling.  Neurological: Negative.  Negative for tremors and numbness.  Psychiatric/Behavioral:  Positive for depression. Negative for behavioral problems (Depression), sleep disturbance and suicidal ideas. The patient is not nervous/anxious.    Vital Signs: BP (!) 102/49   Pulse 82   Temp 98.3 F (36.8 C)   Resp 16   Ht '5\' 3"'$  (1.6 m)   Wt 189 lb 6.4 oz (85.9 kg)   LMP 01/05/2016   SpO2 98%   BMI 33.55 kg/m    Physical Exam Vitals reviewed.  Constitutional:      Appearance: Normal appearance. She is obese.  HENT:     Head: Normocephalic and atraumatic.  Eyes:     Pupils: Pupils are equal, round, and reactive to light.  Cardiovascular:     Rate and Rhythm: Normal rate and regular rhythm.  Pulmonary:     Effort: Pulmonary effort is normal. No respiratory distress.  Skin:    Findings: Erythema (facial) and rash (facial with itching) present.  Neurological:     Mental Status: She is alert and oriented to  person, place, and time.  Psychiatric:        Mood and Affect: Mood normal.        Behavior: Behavior normal.       Assessment/Plan: 1. Epidermoid cyst of neck Urgent referral to derm - Ambulatory referral to Dermatology  2. Allergic contact dermatitis due to other agents Urgent referral to derm and topical steroid refills ordered - Ambulatory referral to Dermatology - triamcinolone cream (KENALOG) 0.1 %; Apply 1 application. topically 2 (two) times daily as needed (itching and rash).  Dispense: 45 g; Refill: 2  3. Migraine without aura, not intractable, without status migrainosus Refills ordered - acetaminophen (TYLENOL) 500 MG tablet; 2 TABS PO BID PRN FOR HEADACHES AND PAINS  Dispense: 45 tablet; Refill: 5 - ibuprofen (ADVIL) 600 MG tablet; ONE TAB PO BID PRN FOR PAIN AND HEADACHES  Dispense: 30 tablet; Refill: 5 - topiramate (TOPAMAX) 50 MG tablet; Take 1 tablet (50 mg total) by mouth every evening.  Dispense: 30 tablet; Refill: 5  4. Allergic rhinitis due to pollen, unspecified seasonality Refills ordered - fluticasone (FLONASE) 50 MCG/ACT nasal spray; Place 1 spray into both nostrils daily.  Dispense: 16 g; Refill: 5 - omeprazole (PRILOSEC) 40 MG capsule; Take 1 capsule (40 mg total) by mouth 2 (two) times daily.  Dispense: 60 capsule; Refill: 5  5. Mild intermittent asthma without complication Refills ordered - fluticasone (FLOVENT HFA) 220 MCG/ACT inhaler; INHALE 2 PUFFS INTO THE LUNGS, 2 TIMES PER DAY. RINSE MOUTH AFTER INHALATIONS  Dispense: 12 g; Refill: 11  6. Seizure-like activity (HCC) Refills ordered - topiramate (TOPAMAX) 50 MG tablet; Take 1 tablet (50 mg total) by mouth every evening.  Dispense: 30 tablet; Refill: 5   General Counseling: Cindi verbalizes understanding of the findings of todays visit and agrees with plan of treatment. I have discussed any further diagnostic evaluation that may be needed or ordered today. We also reviewed her medications today.  she has been encouraged to call the office with any questions or concerns  that should arise related to todays visit.    Orders Placed This Encounter  Procedures   Ambulatory referral to Dermatology    Meds ordered this encounter  Medications   acetaminophen (TYLENOL) 500 MG tablet    Sig: 2 TABS PO BID PRN FOR HEADACHES AND PAINS    Dispense:  45 tablet    Refill:  5   fluticasone (FLONASE) 50 MCG/ACT nasal spray    Sig: Place 1 spray into both nostrils daily.    Dispense:  16 g    Refill:  5   fluticasone (FLOVENT HFA) 220 MCG/ACT inhaler    Sig: INHALE 2 PUFFS INTO THE LUNGS, 2 TIMES PER DAY. RINSE MOUTH AFTER INHALATIONS    Dispense:  12 g    Refill:  11   ibuprofen (ADVIL) 600 MG tablet    Sig: ONE TAB PO BID PRN FOR PAIN AND HEADACHES    Dispense:  30 tablet    Refill:  5   triamcinolone cream (KENALOG) 0.1 %    Sig: Apply 1 application. topically 2 (two) times daily as needed (itching and rash).    Dispense:  45 g    Refill:  2   cetirizine (ZYRTEC ALLERGY) 10 MG tablet    Sig: Take 1 tablet (10 mg total) by mouth daily.    Dispense:  90 tablet    Refill:  1   omeprazole (PRILOSEC) 40 MG capsule    Sig: Take 1 capsule (40 mg total) by mouth 2 (two) times daily.    Dispense:  60 capsule    Refill:  5   topiramate (TOPAMAX) 50 MG tablet    Sig: Take 1 tablet (50 mg total) by mouth every evening.    Dispense:  30 tablet    Refill:  5    Return for previously scheduled, CPE, Phinehas Grounds PCP in september.   Total time spent:30 Minutes Time spent includes review of chart, medications, test results, and follow up plan with the patient.   [place] Controlled Substance Database was reviewed by me.  This patient was seen by Jonetta Osgood, FNP-C in collaboration with Dr. Clayborn Bigness as a part of collaborative care agreement.   Rosary Filosa R. Valetta Fuller, MSN, FNP-C Internal medicine

## 2021-09-03 NOTE — Telephone Encounter (Signed)
Urgent dermatology referral sent via [employer] to Dr. Kirkland Hun w/ [facility]

## 2021-09-04 NOTE — Telephone Encounter (Signed)
Appointment>  10/28/21-[place]

## 2021-09-30 ENCOUNTER — Ambulatory Visit (INDEPENDENT_AMBULATORY_CARE_PROVIDER_SITE_OTHER): Payer: Medicare Other | Admitting: Licensed Clinical Social Worker

## 2021-09-30 DIAGNOSIS — F4310 Post-traumatic stress disorder, unspecified: Secondary | ICD-10-CM | POA: Diagnosis not present

## 2021-09-30 NOTE — Progress Notes (Signed)
  Virtual Visit via Video Note  I connected with Teresa Crawford on 09/30/21 at  2:00 PM EDT by a video enabled telemedicine application and verified that I am speaking with the correct person using two identifiers.  Location: Patient: home Provider: remote office [place], [place])   I discussed the limitations of evaluation and management by telemedicine and the availability of in person appointments. The patient expressed understanding and agreed to proceed.  I discussed the assessment and treatment plan with the patient. The patient was provided an opportunity to ask questions and all were answered. The patient agreed with the plan and demonstrated an understanding of the instructions.   The patient was advised to call back or seek an in-person evaluation if the symptoms worsen or if the condition fails to improve as anticipated.  I provided  60 minutes of non-face-to-face time during this encounter.   Meegan Shanafelt R Tinesha Siegrist, LCSW   THERAPIST PROGRESS NOTE  Session Time: 2-3p  Participation Level: Active  Behavioral Response: NeatAlertAnxious and Depressed  Type of Therapy: Individual Therapy  ProgressTowards Goals: Progressing  Treatment Goals addressed: Problem: Reduce the negative impact trauma related symptoms have on social, occupational, and family functioning. Goal: LTG: Reduce frequency, intensity, and duration of PTSD symptoms so daily functioning is improved: Input needed on appropriate metric.  Patient will score less than 5 on the pt self report (1-10) Outcome: Progressing Goal: STG: Practice interpersonal effectiveness skills 7 times per week for the next 16 weeks Outcome: Progressing Intervention: Encourage verbalization of feelings/concerns/expectations Note: Explored  Intervention: Encourage patient to identify triggers Note: Assisted with identification  Intervention: Encourage self-care activities Note: encouraged Intervention: Encourage verbalization of  feelings, perceptions, fears, stressors, loss of loved one Note: Explored loss and grief experience after losing a friend in 9/22--recently triggered grief    Interventions:  CBT, trauma focused; grief counseling  Summary: Teresa Crawford is a 36 y.o. female who presents with improving symptoms related to PTSD diagnosis. Pt reports that she is having more crying spells, headaches, insomnia at times, and fatigue recently.   Allowed pt to explore and express thoughts and feelings associated with recent life situations and external stressors.   Continued recommendations are as follows: self care behaviors, positive social engagements, focusing on overall work/home/life balance, and focusing on positive physical and emotional wellness.   Suicidal/Homicidal: No  Therapist Response: Pt is continuing to apply interventions learned in session into daily life situations. Pt is currently on track to meet goals utilizing interventions mentioned above. Personal growth and progress noted. Treatment to continue as indicated.    Plan: Return again in 4 weeks.  Encounter Diagnosis  Name Primary?   Posttraumatic stress disorder Yes     Collaboration of Care: Other pt encouraged to continue care with psychiatrist of record, Dr. Elna Breslow  Patient/Guardian was advised Release of Information must be obtained prior to any record release in order to collaborate their care with an outside provider. Patient/Guardian was advised if they have not already done so to contact the registration department to sign all necessary forms in order for [place] to release information regarding their care.   Consent: Patient/Guardian gives verbal consent for treatment and assignment of benefits for services provided during this visit. Patient/Guardian expressed understanding and agreed to proceed.    Ernest Haber Dalaina Tates, LCSW 09/30/2021

## 2021-10-22 ENCOUNTER — Encounter: Payer: Self-pay | Admitting: Psychiatry

## 2021-10-22 ENCOUNTER — Telehealth (INDEPENDENT_AMBULATORY_CARE_PROVIDER_SITE_OTHER): Payer: Medicare Other | Admitting: Psychiatry

## 2021-10-22 DIAGNOSIS — F4310 Post-traumatic stress disorder, unspecified: Secondary | ICD-10-CM

## 2021-10-22 DIAGNOSIS — F3342 Major depressive disorder, recurrent, in full remission: Secondary | ICD-10-CM

## 2021-10-22 DIAGNOSIS — F5105 Insomnia due to other mental disorder: Secondary | ICD-10-CM

## 2021-10-22 MED ORDER — BUPROPION HCL 75 MG PO TABS: 75.0000 mg | ORAL_TABLET | Freq: Every day | ORAL | 0 refills | Status: DC

## 2021-10-22 MED ORDER — TRAZODONE HCL 50 MG PO TABS: 25.0000 mg | ORAL_TABLET | Freq: Every day | ORAL | 0 refills | Status: DC

## 2021-10-22 NOTE — Progress Notes (Signed)
Virtual Visit via Video Note  I connected with Teresa Crawford on 10/22/21 at 10:00 AM EDT by a video enabled telemedicine application and verified that I am speaking with the correct person using two identifiers.  Location Provider Location : [facility] Patient Location : Home  Participants: Patient , Provider    I discussed the limitations of evaluation and management by telemedicine and the availability of in person appointments. The patient expressed understanding and agreed to proceed.   I discussed the assessment and treatment plan with the patient. The patient was provided an opportunity to ask questions and all were answered. The patient agreed with the plan and demonstrated an understanding of the instructions.   The patient was advised to call back or seek an in-person evaluation if the symptoms worsen or if the condition fails to improve as anticipated.   [facility]ne MD OP Progress Note  10/22/2021 1:06 PM [name]  MRN:  [id number]  Chief Complaint:  Chief Complaint  Patient presents with   Follow-up: 36 year old female, has a history of depression, insomnia, PTSD, presented for medication management.   HPI: Teresa Crawford is a 36 year old Caucasian female who is single, lives in [place], has a history of MDD, PTSD, insomnia, bulimia and was evaluated by telemedicine today.  Patient today reports she is currently doing well.  Denies any significant sadness or hopelessness.  Reports she is motivated to work and take care of herself.  Patient denies any significant anxiety symptoms.  Patient reports sleep as good.  Denies any suicidality, homicidality or perceptual disturbances.  Reports she is compliant on her medications.  Denies side effects.  Continues to be in psychotherapy, had an appointment with her therapist Ms.Hussami couple of weeks ago.  Patient reports her sister moved away with her brother and currently is not close to her home anymore.  She however was  able to spend her birthday weekend with her sister and that helped.  Patient denies any other concerns today.  Visit Diagnosis:    ICD-10-CM   1. MDD (major depressive disorder), recurrent, in full remission ([place])  F33.42 buPROPion (WELLBUTRIN) 75 MG tablet    2. Posttraumatic stress disorder  F43.10     3. Insomnia due to mental condition  F51.05 traZODone (DESYREL) 50 MG tablet   depression      Past Psychiatric History: Reviewed past psychiatric history from progress note on 08/16/2018.  Past Medical History:  Past Medical History:  Diagnosis Date   B12 deficiency anemia    Chronic back pain    Chronic fatigue syndrome    Constipation    Depression    Elevated liver enzymes    Endometriosis    Fibroids    Hepatic steatosis    Hypothyroid    Last menstrual period (LMP) > 10 days ago 09/12/15   Mental retardation    Migraine    Post traumatic stress disorder    Posttraumatic stress disorder    PUD (peptic ulcer disease)    Scoliosis    Splenomegaly    Uterine fibroid     Past Surgical History:  Procedure Laterality Date   ABDOMINAL HYSTERECTOMY     BACK SURGERY     COLONOSCOPY  04/27/2006   Wohl-colitis, internal hemorrhoids   COLONOSCOPY  02/2013   focal right colitis, left colon biopsy negative, focal active proctitis without chronicity   ESOPHAGOGASTRODUODENOSCOPY  02/2013   multiple peptic & duodenal ulcers, negative h pylori   INTRAUTERINE DEVICE INSERTION  2012  LAPAROSCOPIC ENDOMETRIOSIS FULGURATION  2015   [name]   OVARIAN CYST REMOVAL      Family Psychiatric History: Reviewed family psychiatric history from progress note on 08/16/2018.  Family History:  Family History  Problem Relation Age of Onset   Breast cancer Mother 70   COPD Father    Lung cancer Father 17   Dementia Father    Diabetes Sister    Diabetes Brother    Ovarian cancer Paternal Aunt    Colon cancer Maternal Grandfather 74   Liver disease Maternal Aunt        ?etiology    Lymphoma Maternal Grandmother 11   Lung cancer Other 11       maternal great grandfather   Stomach cancer Other 74       maternal great grandmother    Social History: Reviewed social history from progress note on 08/16/2018. Social History   Socioeconomic History   Marital status: Single    Spouse name: Not on file   Number of children: 0   Years of education: Not on file   Highest education level: Not on file  Occupational History   Not on file  Tobacco Use   Smoking status: Never   Smokeless tobacco: Never  Vaping Use   Vaping Use: Never used  Substance and Sexual Activity   Alcohol use: Not Currently    Alcohol/week: 0.0 standard drinks of alcohol   Drug use: No   Sexual activity: Not Currently  Other Topics Concern   Not on file  Social History Narrative   She has brother, half brother & sister live nearby   Both parents deceased   Care managed by [name]   Social Determinants of Health   Financial Resource Strain: [place]  (02/11/2017)   Overall Financial Resource Strain (CARDIA)    Difficulty of Paying Living Expenses: Not hard at all  Food Insecurity: No Food Insecurity (02/11/2017)   Hunger Vital Sign    Worried About Running Out of Food in the Last Year: Never true    [place] in the Last Year: Never true  Transportation Needs: No Transportation Needs (02/11/2017)   PRAPARE - Hydrologist (Medical): No    Lack of Transportation (Non-Medical): No  Physical Activity: Inactive (02/11/2017)   Exercise Vital Sign    Days of Exercise per Week: 0 days    Minutes of Exercise per Session: 0 min  Stress: No Stress Concern Present (02/11/2017)   [place]    Feeling of Stress : Not at all  Social Connections: Unknown (02/11/2017)   Social Connection and Isolation Panel [NHANES]    Frequency of Communication with Friends and Family: Patient refused    Frequency of  Social Gatherings with Friends and Family: Patient refused    Attends Religious Services: Patient refused    Active Member of Clubs or Organizations: Patient refused    Attends Archivist Meetings: Patient refused    Marital Status: Patient refused    Allergies:  Allergies  Allergen Reactions   Other Anaphylaxis    Peanut butter, uses Epi pen   Peanut Butter Flavor Swelling    Throat swells Throat swells   Sertraline Other (See Comments)    Panic attacks   Codeine Nausea And Vomiting    nausea   Paroxetine Other (See Comments)    Panic Attack    Paroxetine Hcl Other (See Comments)  Panic attack Other reaction(s): Other (See Comments) Panic attacks   Prednisone & Diphenhydramine Rash   Venlafaxine Other (See Comments)    Panic Attack    Zoloft [Sertraline Hcl] Other (See Comments)    Panic attack    Metabolic Disorder Labs: Lab Results  Component Value Date   HGBA1C 5.5 12/20/2019   No results found for: "PROLACTIN" Lab Results  Component Value Date   CHOL 152 12/27/2020   TRIG 93 12/27/2020   HDL 35 (L) 12/27/2020   CHOLHDL 4.3 12/27/2020   LDLCALC 99 12/27/2020   LDLCALC 114 (H) 12/20/2019   Lab Results  Component Value Date   TSH 1.750 12/27/2020   TSH 3.820 12/20/2019    Therapeutic Level Labs: No results found for: "LITHIUM" No results found for: "VALPROATE" No results found for: "CBMZ"  Current Medications: Current Outpatient Medications  Medication Sig Dispense Refill   acetaminophen (TYLENOL) 500 MG tablet 2 TABS PO BID PRN FOR HEADACHES AND PAINS 45 tablet 5   albuterol (VENTOLIN HFA) 108 (90 Base) MCG/ACT inhaler Inhale 2 puffs into the lungs every 6 (six) hours as needed for wheezing or shortness of breath. 8 g 5   cetirizine (ZYRTEC ALLERGY) 10 MG tablet Take 1 tablet (10 mg total) by mouth daily. 90 tablet 1   docusate sodium (COLACE) 100 MG capsule Take 1 capsule (100 mg total) by mouth daily. Note decreased frequency to once  daily, 30 capsules per month. 30 capsule 5   EPINEPHrine 0.3 mg/0.3 mL IJ SOAJ injection USE FOR ANAPHYLAXIS 1 each 5   fluticasone (FLONASE) 50 MCG/ACT nasal spray Place 1 spray into both nostrils daily. 16 g 5   fluticasone (FLOVENT HFA) 220 MCG/ACT inhaler INHALE 2 PUFFS INTO THE LUNGS, 2 TIMES PER DAY. RINSE MOUTH AFTER INHALATIONS 12 g 11   ibuprofen (ADVIL) 600 MG tablet ONE TAB PO BID PRN FOR PAIN AND HEADACHES 30 tablet 5   omeprazole (PRILOSEC) 40 MG capsule Take 1 capsule (40 mg total) by mouth 2 (two) times daily. 60 capsule 5   promethazine (PHENERGAN) 12.5 MG tablet Take 1 tablet (12.5 mg total) by mouth every 8 (eight) hours as needed for nausea or vomiting. 20 tablet 0   tamsulosin (FLOMAX) 0.4 MG CAPS capsule      topiramate (TOPAMAX) 50 MG tablet Take 1 tablet (50 mg total) by mouth every evening. 30 tablet 5   traMADol (ULTRAM) 50 MG tablet ONE TAB PO BID PRN FOR PAIN AND HEADCAHES 20 tablet 3   triamcinolone cream (KENALOG) 0.1 % Apply 1 application. topically 2 (two) times daily as needed (itching and rash). 45 g 2   buPROPion (WELLBUTRIN) 75 MG tablet Take 1 tablet (75 mg total) by mouth daily with breakfast. 90 tablet 0   malathion (OVIDE) 0.5 % lotion Apply to hair and scalp and leave in place for 8 hours before washing. Repeat in 7 days. (Patient not taking: Reported on 10/22/2021) 60 mL 1   traZODone (DESYREL) 50 MG tablet Take 0.5 tablets (25 mg total) by mouth at bedtime. 45 tablet 0   No current facility-administered medications for this visit.     Musculoskeletal: Strength & Muscle Tone:  UTA Gait & Station:  Seated Patient leans: N/A  Psychiatric Specialty Exam: Review of Systems  Psychiatric/Behavioral: Negative.    All other systems reviewed and are negative.   Last menstrual period 12/06/2015.There is no height or weight on file to calculate BMI.  General Appearance: Casual  Eye Contact:  Fair  Speech:  Clear and Coherent  Volume:  Normal  Mood:   Euthymic  Affect:  Congruent  Thought Process:  Goal Directed and Descriptions of Associations: Intact  Orientation:  Full (Time, Place, and Person)  Thought Content: Logical   Suicidal Thoughts:  No  Homicidal Thoughts:  No  Memory:  Immediate;   Fair Recent;   Fair Remote;   Fair  Judgement:  Fair  Insight:  Fair  Psychomotor Activity:  Normal  Concentration:  Concentration: Fair and Attention Span: Fair  Recall:  [employer] of Knowledge: Fair  Language: Fair  Akathisia:  No  Handed:  Right  AIMS (if indicated): done  Assets:  Communication Skills Desire for Improvement Housing Social Support  ADL's:  Intact  Cognition: WNL  Sleep:  Fair   Screenings: AIMS    Flowsheet Row Video Visit from 10/22/2021 in [place] Total Score 0      GAD-7    Flowsheet Row Video Visit from 10/22/2021 in [place] Office Visit from 04/08/2021 in [facility]  Total GAD-7 Score 0 [place] from 12/25/2020 in [facility] [place] from 12/19/2019 in [facility], [facility] Office Visit from 10/13/2017 in [facility], [facility]  Total Score (max 30 points ) '29 30 27      '$ PHQ2-9    Flowsheet Row Video Visit from 10/22/2021 in [place] from 09/30/2021 in [place] Visit from 09[facility]03/2021 in [facility], [facility] Video Visit from 07/22/2021 in [place] from 06/24/2021 in [facility], [facility]  PHQ-2 Total Score 0 2 0 0 0  PHQ-9 Total Score -- 11 -- -- --      Flowsheet Row Video Visit from 10/22/2021 in [place] from 09/30/2021 in [place] Video Visit from 07/22/2021 in [place] No Risk Error: Q3, 4, or 5 should not be populated when Q2 is No No Risk        Assessment and Plan: Teresa Crawford is a 35 year old Caucasian female who has a history of MDD, PTSD, insomnia, currently works part-time, on SSD was evaluated by telemedicine today.  Patient is currently stable.  Plan as noted below.  Plan MDD in remission Wellbutrin 75 mg p.o. daily Trazodone 25 mg p.o. nightly Continue CBT with Ms. Christina Hussami  PTSD-stable Continue CBT  Insomnia-stable Trazodone 25-50 mg p.o. nightly  Follow-up in clinic in 3 months or sooner if needed.  Collaboration of Care: Collaboration of Care: Referral or follow-up with counselor/therapist AEB encouraged to continue psychotherapy sessions.  Patient/Guardian was advised Release of Information must be obtained prior to any record release in order to collaborate their care with an outside provider. Patient/Guardian was advised if they have not already done so to contact the registration department to sign all necessary forms in order for [place] to release information regarding their care.   Consent: Patient/Guardian gives verbal consent for treatment and assignment of benefits for services provided during this visit. Patient/Guardian expressed understanding and agreed to proceed.   This note was generated in part or whole with voice recognition software. Voice recognition is usually quite accurate but there are transcription errors that can and very often do occur. I apologize for any typographical errors that were  not detected and corrected.      Ursula Alert, MD 10/22/2021, 1:06 PM

## 2021-10-28 DIAGNOSIS — L858 Other specified epidermal thickening: Secondary | ICD-10-CM | POA: Diagnosis not present

## 2021-10-28 DIAGNOSIS — D2272 Melanocytic nevi of left lower limb, including hip: Secondary | ICD-10-CM | POA: Diagnosis not present

## 2021-10-28 DIAGNOSIS — L810 Postinflammatory hyperpigmentation: Secondary | ICD-10-CM | POA: Diagnosis not present

## 2021-10-28 DIAGNOSIS — D2271 Melanocytic nevi of right lower limb, including hip: Secondary | ICD-10-CM | POA: Diagnosis not present

## 2021-10-28 DIAGNOSIS — L309 Dermatitis, unspecified: Secondary | ICD-10-CM | POA: Diagnosis not present

## 2021-10-28 DIAGNOSIS — D225 Melanocytic nevi of trunk: Secondary | ICD-10-CM | POA: Diagnosis not present

## 2021-11-11 ENCOUNTER — Ambulatory Visit (INDEPENDENT_AMBULATORY_CARE_PROVIDER_SITE_OTHER): Payer: Self-pay | Admitting: Licensed Clinical Social Worker

## 2021-11-11 ENCOUNTER — Telehealth (HOSPITAL_COMMUNITY): Payer: Self-pay | Admitting: Licensed Clinical Social Worker

## 2021-11-11 DIAGNOSIS — Z91199 Patient's noncompliance with other medical treatment and regimen due to unspecified reason: Secondary | ICD-10-CM

## 2021-11-11 NOTE — Progress Notes (Signed)
LCSW counselor attempted to connect with patient for scheduled appointment via MyChart video text request x 2;  also attempted to connect via phone without success. LCSW counselor was not able to leave message--phone call "could not be completed at this time"  Attempt 1: Text:  10:03a  Attempt 2: Text:  10:06a  Attempt 3: phone call: 10:16a Could not leave voicemail: "phone call could not be completed at this time"   Visit will be coded as no show

## 2021-11-11 NOTE — Telephone Encounter (Signed)
LCSW counselor attempted to connect with patient for scheduled appointment via MyChart video text request x 2;  also attempted to connect via phone without success. LCSW counselor was not able to leave message--phone call "could not be completed at this time"  Attempt 1: Text:  10:03a  Attempt 2: Text:  10:06a  Attempt 3: phone call: 10:16a Could not leave voicemail: "phone call could not be completed at this time"   Visit will be coded as no show

## 2021-12-09 ENCOUNTER — Ambulatory Visit (INDEPENDENT_AMBULATORY_CARE_PROVIDER_SITE_OTHER): Payer: Medicare Other | Admitting: Licensed Clinical Social Worker

## 2021-12-09 DIAGNOSIS — F4310 Post-traumatic stress disorder, unspecified: Secondary | ICD-10-CM

## 2021-12-09 NOTE — Plan of Care (Signed)
  Problem: Reduce the negative impact trauma related symptoms have on social, occupational, and family functioning. Goal: LTG: Reduce frequency, intensity, and duration of PTSD symptoms so daily functioning is improved: Input needed on appropriate metric.  Patient will score less than 5 on the pt self report (1-10) Outcome: Progressing Goal: STG: Practice interpersonal effectiveness skills 7 times per week for the next 16 weeks Outcome: Progressing Intervention: Work with patient to identify the major components of a recent episode of anxiety: physical symptoms, major thoughts and images, and major behaviors they experienced Description: Helped pt manage anxiety associated with recent health-related incident Note: Assisted with identification Intervention: Encourage verbalization of feelings/concerns/expectations Note: Allowed pt to explore and discuss Intervention: Encourage self-care activities Note: Encouraged

## 2021-12-09 NOTE — Progress Notes (Signed)
Virtual Visit via Video Note  I connected with [name] on 12/09/21 at 10:00 AM EDT by a video enabled telemedicine application and verified that I am speaking with the correct person using two identifiers.  Video connection was lost when less than 50% of the duration of the visit was complete, at which time the remainder of the visit was completed via audio only.  Location: Patient: home Provider: remote office [place], [name]   I discussed the limitations of evaluation and management by telemedicine and the availability of in person appointments. The patient expressed understanding and agreed to proceed.  I discussed the assessment and treatment plan with the patient. The patient was provided an opportunity to ask questions and all were answered. The patient agreed with the plan and demonstrated an understanding of the instructions.   The patient was advised to call back or seek an in-person evaluation if the symptoms worsen or if the condition fails to improve as anticipated.  I provided  60 minutes of non-face-to-face time during this encounter.   Osvaldo Lamping R Reyhan Moronta, LCSW   THERAPIST PROGRESS NOTE  Session Time: 2-3p  Participation Level: Active  Behavioral Response: NeatAlertAnxious and Depressed  Type of Therapy: Individual Therapy  ProgressTowards Goals: Progressing  Treatment Goals addressed: Problem: Reduce the negative impact trauma related symptoms have on social, occupational, and family functioning. Goal: LTG: Reduce frequency, intensity, and duration of PTSD symptoms so daily functioning is improved: Input needed on appropriate metric.  Patient will score less than 5 on the pt self report (1-10) Outcome: Progressing Goal: STG: Practice interpersonal effectiveness skills 7 times per week for the next 16 weeks Outcome: Progressing Intervention: Work with patient to identify the major components of a recent episode of anxiety: physical symptoms, major  thoughts and images, and major behaviors they experienced Description: Helped pt manage anxiety associated with recent health-related incident Note: Assisted with identification Intervention: Encourage verbalization of feelings/concerns/expectations Note: Allowed pt to explore and discuss Intervention: Encourage self-care activities Note: Encouraged    Interventions:  CBT, trauma focused; grief counseling   Summary: TRANIECE BOFFA is a 36 y.o. female who presents with improving symptoms related to PTSD diagnosis. Pt reports that today she is not feeling well due to the flu--pt requested to end session early.   Allowed pt to explore and express thoughts and feelings associated with recent life situations and external stressors. Discussed relationships--pt reports that all going well with family members and boyfriend. Pt reports that her sister is moving with her brother 4 hours away due to cost of living increases. Pt states that she is sad that her sister isn't closer but is happy that she has a coordinator to help with appointments, etc. Pt reports that relationship with boyfriend going well. Pt continues to be happy with her job--pt has not been working due to acute illness (flu) recently.   Continued recommendations are as follows: self care behaviors, positive social engagements, focusing on overall work/home/life balance, and focusing on positive physical and emotional wellness.   Suicidal/Homicidal: No  Therapist Response: Subjective: The main themes of the session were: exploration of relationships with sibling(s); expression of stressful experiences; and exploration of health concerns and illness.  Objective: The patient's affective and emotional state appeared calm and reflective and improved.  Assessment: Developments continue in the areas of family and relational functioning. Developments continue in the areas of healthy separation and interdependence.  Plan: The main  therapeutic interventions consisted of: a focus on impulse control and  anger management; an emphasis on strengthening self-care and self-preservation; targeting more effective problem solving and coping skills in daily life; exploration of appropriate interaction and communication; and focus on social skills enhancement and practic  Plan: Return again in 4 weeks.  Encounter Diagnosis  Name Primary?   Posttraumatic stress disorder Yes    Collaboration of Care: Other pt encouraged to continue care with psychiatrist of record, Dr. Shea Evans  Patient/Guardian was advised Release of Information must be obtained prior to any record release in order to collaborate their care with an outside provider. Patient/Guardian was advised if they have not already done so to contact the registration department to sign all necessary forms in order for [place] to release information regarding their care.   Consent: Patient/Guardian gives verbal consent for treatment and assignment of benefits for services provided during this visit. Patient/Guardian expressed understanding and agreed to proceed.    [place], LCSW 12/09/2021

## 2021-12-10 ENCOUNTER — Other Ambulatory Visit: Payer: Self-pay | Admitting: Nurse Practitioner

## 2021-12-10 ENCOUNTER — Telehealth (INDEPENDENT_AMBULATORY_CARE_PROVIDER_SITE_OTHER): Payer: Medicare Other | Admitting: Nurse Practitioner

## 2021-12-10 ENCOUNTER — Other Ambulatory Visit: Payer: Self-pay

## 2021-12-10 ENCOUNTER — Encounter: Payer: Self-pay | Admitting: Nurse Practitioner

## 2021-12-10 VITALS — Resp 16 | Ht 63.0 in

## 2021-12-10 DIAGNOSIS — J028 Acute pharyngitis due to other specified organisms: Secondary | ICD-10-CM

## 2021-12-10 DIAGNOSIS — J209 Acute bronchitis, unspecified: Secondary | ICD-10-CM

## 2021-12-10 DIAGNOSIS — R1114 Bilious vomiting: Secondary | ICD-10-CM

## 2021-12-10 DIAGNOSIS — B9689 Other specified bacterial agents as the cause of diseases classified elsewhere: Secondary | ICD-10-CM | POA: Diagnosis not present

## 2021-12-10 DIAGNOSIS — K5903 Drug induced constipation: Secondary | ICD-10-CM

## 2021-12-10 MED ORDER — ONDANSETRON 4 MG PO TBDP
4.0000 mg | ORAL_TABLET | Freq: Three times a day (TID) | ORAL | 0 refills | Status: DC | PRN
Start: 2021-12-10 — End: 2022-01-01

## 2021-12-10 MED ORDER — AMOXICILLIN-POT CLAVULANATE 875-125 MG PO TABS
1.0000 | ORAL_TABLET | Freq: Two times a day (BID) | ORAL | 0 refills | Status: DC
Start: 2021-12-10 — End: 2022-01-01

## 2021-12-10 NOTE — Progress Notes (Unsigned)
[facility] [place], [place] [address]  Internal MEDICINE  Telephone Visit  Patient Name: Teresa Crawford  [id number]  [id number]  Date of Service: 12/10/2021  I connected with the patient at 1120 by telephone and verified the patients identity using two identifiers.   I discussed the limitations, risks, security and privacy concerns of performing an evaluation and management service by telephone and the availability of in person appointments. I also discussed with the patient that there may be a patient responsible charge related to the service.  The patient expressed understanding and agrees to proceed.    Chief Complaint  Patient presents with   Telephone Assessment    [phone]   Telephone Screen   Cough    Negative for COVID - All symptoms started Wednesday, got much worse on Saturday   Headache   Fatigue   Vomiting    Tried nausea medication, not working     HPI [place] presents for a telehealth virtual visit for vomiting, nausea, fatigue, headache Nausea med not working Neg for covid Sx started last Wednesday, got worse Saturday.  Also c/o SOB, cough, and chest tightness, feeling feverish, and body aches Unable to come to the office for strep or flu testing  Current Medication: Outpatient Encounter Medications as of 12/10/2021  Medication Sig   acetaminophen (TYLENOL) 500 MG tablet 2 TABS PO BID PRN FOR HEADACHES AND PAINS   albuterol (VENTOLIN HFA) 108 (90 Base) MCG/ACT inhaler Inhale 2 puffs into the lungs every 6 (six) hours as needed for wheezing or shortness of breath.   buPROPion (WELLBUTRIN) 75 MG tablet Take 1 tablet (75 mg total) by mouth daily with breakfast.   cetirizine (ZYRTEC ALLERGY) 10 MG tablet Take 1 tablet (10 mg total) by mouth daily.   docusate sodium (COLACE) 100 MG capsule Take 1 capsule (100 mg total) by mouth daily. Note decreased frequency to once daily, 30 capsules per month.   EPINEPHrine 0.3 mg/0.3 mL IJ SOAJ  injection USE FOR ANAPHYLAXIS   fluticasone (FLONASE) 50 MCG/ACT nasal spray Place 1 spray into both nostrils daily.   fluticasone (FLOVENT HFA) 220 MCG/ACT inhaler INHALE 2 PUFFS INTO THE LUNGS, 2 TIMES PER DAY. RINSE MOUTH AFTER INHALATIONS   ibuprofen (ADVIL) 600 MG tablet ONE TAB PO BID PRN FOR PAIN AND HEADACHES   malathion (OVIDE) 0.5 % lotion Apply to hair and scalp and leave in place for 8 hours before washing. Repeat in 7 days.   omeprazole (PRILOSEC) 40 MG capsule Take 1 capsule (40 mg total) by mouth 2 (two) times daily.   promethazine (PHENERGAN) 12.5 MG tablet Take 1 tablet (12.5 mg total) by mouth every 8 (eight) hours as needed for nausea or vomiting.   tamsulosin (FLOMAX) 0.4 MG CAPS capsule    topiramate (TOPAMAX) 50 MG tablet Take 1 tablet (50 mg total) by mouth every evening.   traMADol (ULTRAM) 50 MG tablet ONE TAB PO BID PRN FOR PAIN AND HEADCAHES   traZODone (DESYREL) 50 MG tablet Take 0.5 tablets (25 mg total) by mouth at bedtime.   triamcinolone cream (KENALOG) 0.1 % Apply 1 application. topically 2 (two) times daily as needed (itching and rash).   No facility-administered encounter medications on file as of 12/10/2021.    Surgical History: Past Surgical History:  Procedure Laterality Date   ABDOMINAL HYSTERECTOMY     BACK SURGERY     COLONOSCOPY  04/27/2006   Wohl-colitis, internal hemorrhoids   COLONOSCOPY  02/2013   focal  right colitis, left colon biopsy negative, focal active proctitis without chronicity   ESOPHAGOGASTRODUODENOSCOPY  02/2013   multiple peptic & duodenal ulcers, negative h pylori   INTRAUTERINE DEVICE INSERTION  2012   LAPAROSCOPIC ENDOMETRIOSIS FULGURATION  2015   [name]   OVARIAN CYST REMOVAL      Medical History: Past Medical History:  Diagnosis Date   B12 deficiency anemia    Chronic back pain    Chronic fatigue syndrome    Constipation    Depression    Elevated liver enzymes    Endometriosis    Fibroids    Hepatic steatosis     Hypothyroid    Last menstrual period (LMP) > 10 days ago 09/12/15   Mental retardation    Migraine    Post traumatic stress disorder    Posttraumatic stress disorder    PUD (peptic ulcer disease)    Scoliosis    Splenomegaly    Uterine fibroid     Family History: Family History  Problem Relation Age of Onset   Breast cancer Mother 34   COPD Father    Lung cancer Father 29   Dementia Father    Diabetes Sister    Diabetes Brother    Ovarian cancer Paternal Aunt    Colon cancer Maternal Grandfather 56   Liver disease Maternal Aunt        ?etiology   Lymphoma Maternal Grandmother 54   Lung cancer Other 59       maternal great grandfather   Stomach cancer Other 85       maternal great grandmother    Social History   Socioeconomic History   Marital status: Single    Spouse name: Not on file   Number of children: 0   Years of education: Not on file   Highest education level: Not on file  Occupational History   Not on file  Tobacco Use   Smoking status: Never   Smokeless tobacco: Never  Vaping Use   Vaping Use: Never used  Substance and Sexual Activity   Alcohol use: Not Currently    Alcohol/week: 0.0 standard drinks of alcohol   Drug use: No   Sexual activity: Not Currently  Other Topics Concern   Not on file  Social History Narrative   She has brother, half brother & sister live nearby   Both parents deceased   Care managed by [name]   Social Determinants of Health   Financial Resource Strain: Low Risk  (02/11/2017)   Overall Financial Resource Strain (CARDIA)    Difficulty of Paying Living Expenses: Not hard at all  Food Insecurity: No Food Insecurity (02/11/2017)   Hunger Vital Sign    Worried About Running Out of Food in the Last Year: Never true    Ran Out of Food in the Last Year: Never true  Transportation Needs: No Transportation Needs (02/11/2017)   PRAPARE - Hydrologist (Medical): No    Lack of Transportation  (Non-Medical): No  Physical Activity: Inactive (02/11/2017)   Exercise Vital Sign    Days of Exercise per Week: 0 days    Minutes of Exercise per Session: 0 min  Stress: No Stress Concern Present (02/11/2017)   [place]    Feeling of Stress : Not at all  Social Connections: Unknown (02/11/2017)   Social Connection and Isolation Panel [NHANES]    Frequency of Communication with Friends and Family: Patient refused  Frequency of Social Gatherings with Friends and Family: Patient refused    Attends Religious Services: Patient refused    Active Member of Clubs or Organizations: Patient refused    Attends Archivist Meetings: Patient refused    Marital Status: Patient refused  Intimate Partner Violence: Unknown (02/11/2017)   Humiliation, Afraid, Rape, and Kick questionnaire    Fear of Current or Ex-Partner: Patient refused    Emotionally Abused: Patient refused    Physically Abused: Patient refused    Sexually Abused: Patient refused      Review of Systems  Constitutional:  Positive for fatigue and fever (feels feverish).  Respiratory:  Positive for cough, chest tightness and shortness of breath.   Cardiovascular: Negative.  Negative for chest pain and palpitations.  Gastrointestinal:  Positive for abdominal pain, nausea and vomiting. Negative for diarrhea.  Musculoskeletal:  Positive for myalgias.  Neurological:  Positive for headaches.    Vital Signs: Resp 16   Ht '5\' 3"'$  (1.6 m)   LMP 12/06/2015   BMI 33.55 kg/m    Observation/Objective: She is alert and oriented and engages in conversation appropriately. She does not appear to be in any acute distress over video call.     Assessment/Plan:   General Counseling: Toma Deiters understanding of the findings of today's phone visit and agrees with plan of treatment. I have discussed any further diagnostic evaluation that may be needed or ordered  today. We also reviewed her medications today. she has been encouraged to call the office with any questions or concerns that should arise related to todays visit.  No follow-ups on file.   No orders of the defined types were placed in this encounter.   No orders of the defined types were placed in this encounter.   Time spent:10 Minutes Time spent with patient included reviewing progress notes, labs, imaging studies, and discussing plan for follow up.  [place] Controlled Substance Database was reviewed by me for overdose risk score (ORS) if appropriate.  This patient was seen by Jonetta Osgood, FNP-C in collaboration with Dr. Clayborn Bigness as a part of collaborative care agreement.  Charvis Lightner R. Valetta Fuller, MSN, FNP-C Internal medicine

## 2021-12-11 ENCOUNTER — Telehealth: Payer: Medicare Other | Admitting: Nurse Practitioner

## 2021-12-14 ENCOUNTER — Encounter: Payer: Self-pay | Admitting: Nurse Practitioner

## 2021-12-29 ENCOUNTER — Telehealth: Payer: Self-pay | Admitting: Nurse Practitioner

## 2021-12-29 NOTE — Telephone Encounter (Signed)
Left vm to confirm 01/01/22 appointment-Toni

## 2022-01-01 ENCOUNTER — Ambulatory Visit (INDEPENDENT_AMBULATORY_CARE_PROVIDER_SITE_OTHER): Payer: Medicare Other | Admitting: Nurse Practitioner

## 2022-01-01 ENCOUNTER — Encounter: Payer: Self-pay | Admitting: Nurse Practitioner

## 2022-01-01 VITALS — BP 108/77 | HR 74 | Temp 96.6°F | Resp 16 | Ht 63.0 in | Wt 187.2 lb

## 2022-01-01 DIAGNOSIS — E538 Deficiency of other specified B group vitamins: Secondary | ICD-10-CM

## 2022-01-01 DIAGNOSIS — E782 Mixed hyperlipidemia: Secondary | ICD-10-CM | POA: Diagnosis not present

## 2022-01-01 DIAGNOSIS — Z0001 Encounter for general adult medical examination with abnormal findings: Secondary | ICD-10-CM

## 2022-01-01 DIAGNOSIS — R300 Dysuria: Secondary | ICD-10-CM

## 2022-01-01 DIAGNOSIS — Z23 Encounter for immunization: Secondary | ICD-10-CM

## 2022-01-01 DIAGNOSIS — Z1231 Encounter for screening mammogram for malignant neoplasm of breast: Secondary | ICD-10-CM

## 2022-01-01 DIAGNOSIS — E559 Vitamin D deficiency, unspecified: Secondary | ICD-10-CM

## 2022-01-01 DIAGNOSIS — Z760 Encounter for issue of repeat prescription: Secondary | ICD-10-CM | POA: Diagnosis not present

## 2022-01-01 DIAGNOSIS — E039 Hypothyroidism, unspecified: Secondary | ICD-10-CM | POA: Diagnosis not present

## 2022-01-01 MED ORDER — CETIRIZINE HCL 10 MG PO TABS: 10.0000 mg | ORAL_TABLET | Freq: Every day | ORAL | 1 refills | Status: DC

## 2022-01-01 MED ORDER — EPINEPHRINE 0.3 MG/0.3ML IJ SOAJ: INTRAMUSCULAR | 5 refills | Status: DC

## 2022-01-01 MED ORDER — ALBUTEROL SULFATE HFA 108 (90 BASE) MCG/ACT IN AERS: 2.0000 | INHALATION_SPRAY | Freq: Four times a day (QID) | RESPIRATORY_TRACT | 5 refills | Status: DC | PRN

## 2022-01-01 MED ORDER — IBUPROFEN 600 MG PO TABS: ORAL_TABLET | ORAL | 5 refills | Status: DC

## 2022-01-01 MED ORDER — TOPIRAMATE 50 MG PO TABS: 50.0000 mg | ORAL_TABLET | Freq: Every evening | ORAL | 5 refills | Status: DC

## 2022-01-01 MED ORDER — TRIAMCINOLONE ACETONIDE 0.1 % EX CREA: 1.0000 | TOPICAL_CREAM | Freq: Two times a day (BID) | CUTANEOUS | 1 refills | Status: DC | PRN

## 2022-01-01 MED ORDER — ONDANSETRON 4 MG PO TBDP: 4.0000 mg | ORAL_TABLET | Freq: Three times a day (TID) | ORAL | 0 refills | Status: DC | PRN

## 2022-01-01 MED ORDER — OMEPRAZOLE 40 MG PO CPDR: 40.0000 mg | DELAYED_RELEASE_CAPSULE | Freq: Two times a day (BID) | ORAL | 5 refills | Status: DC

## 2022-01-01 MED ORDER — FLUTICASONE PROPIONATE 50 MCG/ACT NA SUSP: 1.0000 | Freq: Every day | NASAL | 5 refills | Status: DC

## 2022-01-01 MED ORDER — TRAMADOL HCL 50 MG PO TABS: ORAL_TABLET | ORAL | 3 refills | Status: DC

## 2022-01-01 MED ORDER — ACETAMINOPHEN 500 MG PO TABS: ORAL_TABLET | ORAL | 5 refills | Status: DC

## 2022-01-01 NOTE — Progress Notes (Signed)
[facility] [place], [place] [address]  Internal MEDICINE  Office Visit Note  Patient Name: Teresa Crawford  [id number]  [id number]  Date of Service: 01/01/2022  Chief Complaint  Patient presents with   Medicare Wellness   Depression    HPI [name] presents for an annual well visit and physical exam.  Well-appearing 36 year old female with migraines, hypothyroidism, PTSD, depression, intellectual disability, and allergic rhinitis.  Mammogram due in October next month Due for routine labs Pap due in 2025 Seen by dermatology in July, skin is improving with cerave and triamcinolone mixture - for keratosis pilaris; lice and rash issue is resolved.  Sees psych for depression, PTSD --stable  Need PRN orders for her residential facility renewed and scheduled medications refilled. Overall, patient reports that she feels good and has no new or worsening pain or other concerns today.    Current Medication: Outpatient Encounter Medications as of 01/01/2022  Medication Sig   buPROPion (WELLBUTRIN) 75 MG tablet Take 1 tablet (75 mg total) by mouth daily with breakfast.   docusate sodium (COLACE) 100 MG capsule TAKE 1 CAPSULE BY MOUTH ONCE DAILY FOR STOOL SOFTENER   fluticasone (FLOVENT HFA) 220 MCG/ACT inhaler INHALE 2 PUFFS INTO THE LUNGS, 2 TIMES PER DAY. RINSE MOUTH AFTER INHALATIONS   tamsulosin (FLOMAX) 0.4 MG CAPS capsule    traZODone (DESYREL) 50 MG tablet Take 0.5 tablets (25 mg total) by mouth at bedtime.   [DISCONTINUED] acetaminophen (TYLENOL) 500 MG tablet 2 TABS PO BID PRN FOR HEADACHES AND PAINS   [DISCONTINUED] albuterol (VENTOLIN HFA) 108 (90 Base) MCG/ACT inhaler Inhale 2 puffs into the lungs every 6 (six) hours as needed for wheezing or shortness of breath.   [DISCONTINUED] amoxicillin-clavulanate (AUGMENTIN) 875-125 MG tablet Take 1 tablet by mouth 2 (two) times daily. For 10 days   [DISCONTINUED] cetirizine (ZYRTEC ALLERGY) 10 MG tablet Take 1  tablet (10 mg total) by mouth daily.   [DISCONTINUED] EPINEPHrine 0.3 mg/0.3 mL IJ SOAJ injection USE FOR ANAPHYLAXIS   [DISCONTINUED] fluticasone (FLONASE) 50 MCG/ACT nasal spray Place 1 spray into both nostrils daily.   [DISCONTINUED] ibuprofen (ADVIL) 600 MG tablet ONE TAB PO BID PRN FOR PAIN AND HEADACHES   [DISCONTINUED] malathion (OVIDE) 0.5 % lotion Apply to hair and scalp and leave in place for 8 hours before washing. Repeat in 7 days.   [DISCONTINUED] omeprazole (PRILOSEC) 40 MG capsule Take 1 capsule (40 mg total) by mouth 2 (two) times daily.   [DISCONTINUED] ondansetron (ZOFRAN-ODT) 4 MG disintegrating tablet Take 1 tablet (4 mg total) by mouth every 8 (eight) hours as needed for nausea or vomiting.   [DISCONTINUED] promethazine (PHENERGAN) 12.5 MG tablet Take 1 tablet (12.5 mg total) by mouth every 8 (eight) hours as needed for nausea or vomiting.   [DISCONTINUED] topiramate (TOPAMAX) 50 MG tablet Take 1 tablet (50 mg total) by mouth every evening.   [DISCONTINUED] traMADol (ULTRAM) 50 MG tablet ONE TAB PO BID PRN FOR PAIN AND HEADCAHES   [DISCONTINUED] triamcinolone cream (KENALOG) 0.1 % Apply 1 application. topically 2 (two) times daily as needed (itching and rash).   acetaminophen (TYLENOL) 500 MG tablet 2 TABS PO BID PRN FOR HEADACHES AND PAINS   albuterol (VENTOLIN HFA) 108 (90 Base) MCG/ACT inhaler Inhale 2 puffs into the lungs every 6 (six) hours as needed for wheezing or shortness of breath.   cetirizine (ZYRTEC ALLERGY) 10 MG tablet Take 1 tablet (10 mg total) by mouth daily.   EPINEPHrine  0.3 mg/0.3 mL IJ SOAJ injection USE FOR ANAPHYLAXIS   fluticasone (FLONASE) 50 MCG/ACT nasal spray Place 1 spray into both nostrils daily.   ibuprofen (ADVIL) 600 MG tablet ONE TAB PO BID PRN FOR PAIN AND HEADACHES   omeprazole (PRILOSEC) 40 MG capsule Take 1 capsule (40 mg total) by mouth 2 (two) times daily.   ondansetron (ZOFRAN-ODT) 4 MG disintegrating tablet Take 1 tablet (4 mg total)  by mouth every 8 (eight) hours as needed for nausea or vomiting.   topiramate (TOPAMAX) 50 MG tablet Take 1 tablet (50 mg total) by mouth every evening.   traMADol (ULTRAM) 50 MG tablet ONE TAB PO BID PRN FOR PAIN AND HEADCAHES   triamcinolone cream (KENALOG) 0.1 % Apply 1 Application topically 2 (two) times daily as needed (itching and rash).   No facility-administered encounter medications on file as of 01/01/2022.    Surgical History: Past Surgical History:  Procedure Laterality Date   ABDOMINAL HYSTERECTOMY     BACK SURGERY     COLONOSCOPY  04/27/2006   Wohl-colitis, internal hemorrhoids   COLONOSCOPY  02/2013   focal right colitis, left colon biopsy negative, focal active proctitis without chronicity   ESOPHAGOGASTRODUODENOSCOPY  02/2013   multiple peptic & duodenal ulcers, negative h pylori   INTRAUTERINE DEVICE INSERTION  2012   LAPAROSCOPIC ENDOMETRIOSIS FULGURATION  2015   [name]   OVARIAN CYST REMOVAL      Medical History: Past Medical History:  Diagnosis Date   B12 deficiency anemia    Chronic back pain    Chronic fatigue syndrome    Constipation    Depression    Elevated liver enzymes    Endometriosis    Fibroids    Hepatic steatosis    Hypothyroid    Last menstrual period (LMP) > 10 days ago 09/12/15   Mental retardation    Migraine    Post traumatic stress disorder    Posttraumatic stress disorder    PUD (peptic ulcer disease)    Scoliosis    Splenomegaly    Uterine fibroid     Family History: Family History  Problem Relation Age of Onset   Breast cancer Mother 61   COPD Father    Lung cancer Father 57   Dementia Father    Diabetes Sister    Diabetes Brother    Ovarian cancer Paternal Aunt    Colon cancer Maternal Grandfather 78   Liver disease Maternal Aunt        ?etiology   Lymphoma Maternal Grandmother 40   Lung cancer Other 69       maternal great grandfather   Stomach cancer Other 8       maternal great grandmother    Social History    Socioeconomic History   Marital status: Single    Spouse name: Not on file   Number of children: 0   Years of education: Not on file   Highest education level: Not on file  Occupational History   Not on file  Tobacco Use   Smoking status: Never   Smokeless tobacco: Never  Vaping Use   Vaping Use: Never used  Substance and Sexual Activity   Alcohol use: Not Currently    Alcohol/week: 0.0 standard drinks of alcohol   Drug use: No   Sexual activity: Not Currently  Other Topics Concern   Not on file  Social History Narrative   She has brother, half brother & sister live nearby   Both parents deceased  Care managed by [name]   Social Determinants of Health   Financial Resource Strain: Low Risk  (02/11/2017)   Overall Financial Resource Strain (CARDIA)    Difficulty of Paying Living Expenses: Not hard at all  Food Insecurity: No Food Insecurity (02/11/2017)   Hunger Vital Sign    Worried About Running Out of Food in the Last Year: Never true    Ran Out of Food in the Last Year: Never true  Transportation Needs: No Transportation Needs (02/11/2017)   PRAPARE - Hydrologist (Medical): No    Lack of Transportation (Non-Medical): No  Physical Activity: Inactive (02/11/2017)   Exercise Vital Sign    Days of Exercise per Week: 0 days    Minutes of Exercise per Session: 0 min  Stress: No Stress Concern Present (02/11/2017)   [place]    Feeling of Stress : Not at all  Social Connections: Unknown (02/11/2017)   Social Connection and Isolation Panel [NHANES]    Frequency of Communication with Friends and Family: Patient refused    Frequency of Social Gatherings with Friends and Family: Patient refused    Attends Religious Services: Patient refused    Active Member of Clubs or Organizations: Patient refused    Attends Archivist Meetings: Patient refused    Marital Status:  Patient refused  Intimate Partner Violence: Unknown (02/11/2017)   Humiliation, Afraid, Rape, and Kick questionnaire    Fear of Current or Ex-Partner: Patient refused    Emotionally Abused: Patient refused    Physically Abused: Patient refused    Sexually Abused: Patient refused      Review of Systems  Constitutional:  Negative for activity change, appetite change, chills, fatigue, fever and unexpected weight change.  HENT: Negative.  Negative for congestion, ear pain, rhinorrhea, sore throat and trouble swallowing.   Eyes: Negative.   Respiratory: Negative.  Negative for cough, chest tightness, shortness of breath and wheezing.   Cardiovascular: Negative.  Negative for chest pain.  Gastrointestinal: Negative.  Negative for abdominal pain, blood in stool, constipation, diarrhea, nausea and vomiting.  Endocrine: Negative.   Genitourinary: Negative.  Negative for difficulty urinating, dysuria, frequency, hematuria and urgency.  Musculoskeletal: Negative.  Negative for arthralgias, back pain, joint swelling, myalgias and neck pain.  Skin: Negative.  Negative for rash and wound.  Allergic/Immunologic: Negative.  Negative for immunocompromised state.  Neurological: Negative.  Negative for dizziness, seizures, numbness and headaches.  Hematological: Negative.   Psychiatric/Behavioral:  Positive for depression. Negative for behavioral problems, self-injury and suicidal ideas. The patient is not nervous/anxious.     Vital Signs: BP 108/77   Pulse 74   Temp (!) 96.6 F (35.9 C)   Resp 16   Ht 5' 3" (1.6 m)   Wt 187 lb 3.2 oz (84.9 kg)   LMP 12/06/2015   SpO2 99%   BMI 33.16 kg/m    Physical Exam Vitals reviewed.  Constitutional:      General: She is awake. She is not in acute distress.    Appearance: Normal appearance. She is well-developed and well-groomed. She is obese. She is not ill-appearing or diaphoretic.  HENT:     Head: Normocephalic and atraumatic.     Right Ear:  Tympanic membrane, ear canal and external ear normal.     Left Ear: Tympanic membrane, ear canal and external ear normal.     Nose: Nose normal. No congestion or rhinorrhea.  Mouth/Throat:     Lips: Pink.     Mouth: Mucous membranes are moist.     Pharynx: Oropharynx is clear. Uvula midline. No oropharyngeal exudate or posterior oropharyngeal erythema.  Eyes:     General: Lids are normal. Vision grossly intact. Gaze aligned appropriately. No scleral icterus.       Right eye: No discharge.        Left eye: No discharge.     Extraocular Movements: Extraocular movements intact.     Conjunctiva/sclera: Conjunctivae normal.     Pupils: Pupils are equal, round, and reactive to light.     Funduscopic exam:    Right eye: Red reflex present.        Left eye: Red reflex present. Neck:     Thyroid: No thyromegaly.     Vascular: No carotid bruit or JVD.     Trachea: Trachea and phonation normal. No tracheal deviation.  Cardiovascular:     Rate and Rhythm: Normal rate and regular rhythm.     Pulses: Normal pulses.     Heart sounds: Normal heart sounds, S1 normal and S2 normal. No murmur heard.    No friction rub. No gallop.  Pulmonary:     Effort: Pulmonary effort is normal. No accessory muscle usage or respiratory distress.     Breath sounds: Normal breath sounds and air entry. No stridor. No wheezing or rales.  Chest:     Chest wall: No tenderness.  Abdominal:     General: Bowel sounds are normal. There is no distension.     Palpations: Abdomen is soft. There is no shifting dullness, fluid wave, mass or pulsatile mass.     Tenderness: There is no abdominal tenderness. There is no guarding or rebound.  Musculoskeletal:        General: No tenderness or deformity. Normal range of motion.     Cervical back: Normal range of motion and neck supple.     Right lower leg: No edema.     Left lower leg: No edema.  Lymphadenopathy:     Cervical: No cervical adenopathy.  Skin:    General: Skin  is warm and dry.     Capillary Refill: Capillary refill takes less than 2 seconds.     Coloration: Skin is not pale.     Findings: No erythema or rash.  Neurological:     Mental Status: She is alert and oriented to person, place, and time.     Cranial Nerves: No cranial nerve deficit.     Motor: No abnormal muscle tone.     Coordination: Coordination normal.     Gait: Gait normal.     Deep Tendon Reflexes: Reflexes are normal and symmetric.  Psychiatric:        Mood and Affect: Mood and affect normal.        Behavior: Behavior normal. Behavior is cooperative.        Thought Content: Thought content normal.        Judgment: Judgment normal.        Assessment/Plan: 1. Encounter for general adult medical examination with abnormal findings Age-appropriate preventive screenings and vaccinations discussed, annual physical exam completed. Routine labs for health maintenance ordered. PHM updated.  - Vitamin D (25 hydroxy) - CBC with Differential/Platelet - CMP14+EGFR - TSH + free T4 - Lipid Profile - B12 and Folate Panel  2. Acquired hypothyroidism - CBC with Differential/Platelet - CMP14+EGFR - TSH + free T4  3. Mixed hyperlipidemia - CBC with Differential/Platelet -  CMP14+EGFR - Lipid Profile  4. Vitamin D deficiency - Vitamin D (25 hydroxy)  5. B12 deficiency - CBC with Differential/Platelet - B12 and Folate Panel  6. Dysuria - UA/M w/rflx Culture, Routine  7. Needs flu shot Administered in office today - Flu Vaccine MDCK QUAD PF  8. Encounter for screening mammogram for malignant neoplasm of breast Mammogram due in october - MM 3D SCREEN BREAST BILATERAL; Future  9. Medication refill - albuterol (VENTOLIN HFA) 108 (90 Base) MCG/ACT inhaler; Inhale 2 puffs into the lungs every 6 (six) hours as needed for wheezing or shortness of breath.  Dispense: 8 g; Refill: 5 - acetaminophen (TYLENOL) 500 MG tablet; 2 TABS PO BID PRN FOR HEADACHES AND PAINS  Dispense: 45  tablet; Refill: 5 - cetirizine (ZYRTEC ALLERGY) 10 MG tablet; Take 1 tablet (10 mg total) by mouth daily.  Dispense: 90 tablet; Refill: 1 - EPINEPHrine 0.3 mg/0.3 mL IJ SOAJ injection; USE FOR ANAPHYLAXIS  Dispense: 1 each; Refill: 5 - fluticasone (FLONASE) 50 MCG/ACT nasal spray; Place 1 spray into both nostrils daily.  Dispense: 16 g; Refill: 5 - ibuprofen (ADVIL) 600 MG tablet; ONE TAB PO BID PRN FOR PAIN AND HEADACHES  Dispense: 30 tablet; Refill: 5 - omeprazole (PRILOSEC) 40 MG capsule; Take 1 capsule (40 mg total) by mouth 2 (two) times daily.  Dispense: 60 capsule; Refill: 5 - ondansetron (ZOFRAN-ODT) 4 MG disintegrating tablet; Take 1 tablet (4 mg total) by mouth every 8 (eight) hours as needed for nausea or vomiting.  Dispense: 20 tablet; Refill: 0 - traMADol (ULTRAM) 50 MG tablet; ONE TAB PO BID PRN FOR PAIN AND HEADCAHES  Dispense: 20 tablet; Refill: 3 - triamcinolone cream (KENALOG) 0.1 %; Apply 1 Application topically 2 (two) times daily as needed (itching and rash).  Dispense: 453.6 g; Refill: 1 - topiramate (TOPAMAX) 50 MG tablet; Take 1 tablet (50 mg total) by mouth every evening.  Dispense: 30 tablet; Refill: 5     General Counseling: Catlyn verbalizes understanding of the findings of todays visit and agrees with plan of treatment. I have discussed any further diagnostic evaluation that may be needed or ordered today. We also reviewed her medications today. she has been encouraged to call the office with any questions or concerns that should arise related to todays visit.    Orders Placed This Encounter  Procedures   MM 3D SCREEN BREAST BILATERAL   Flu Vaccine MDCK QUAD PF   UA/M w/rflx Culture, Routine   Vitamin D (25 hydroxy)   CBC with Differential/Platelet   CMP14+EGFR   TSH + free T4   Lipid Profile   B12 and Folate Panel    Meds ordered this encounter  Medications   albuterol (VENTOLIN HFA) 108 (90 Base) MCG/ACT inhaler    Sig: Inhale 2 puffs into the lungs  every 6 (six) hours as needed for wheezing or shortness of breath.    Dispense:  8 g    Refill:  5   acetaminophen (TYLENOL) 500 MG tablet    Sig: 2 TABS PO BID PRN FOR HEADACHES AND PAINS    Dispense:  45 tablet    Refill:  5   cetirizine (ZYRTEC ALLERGY) 10 MG tablet    Sig: Take 1 tablet (10 mg total) by mouth daily.    Dispense:  90 tablet    Refill:  1   EPINEPHrine 0.3 mg/0.3 mL IJ SOAJ injection    Sig: USE FOR ANAPHYLAXIS    Dispense:  1 each  Refill:  5    WILL NEED REFILLS AUTHORIZED IF PATIENT IS TO [place] THIS IN AN EMERGENCY.  THANKS   fluticasone (FLONASE) 50 MCG/ACT nasal spray    Sig: Place 1 spray into both nostrils daily.    Dispense:  16 g    Refill:  5   ibuprofen (ADVIL) 600 MG tablet    Sig: ONE TAB PO BID PRN FOR PAIN AND HEADACHES    Dispense:  30 tablet    Refill:  5   omeprazole (PRILOSEC) 40 MG capsule    Sig: Take 1 capsule (40 mg total) by mouth 2 (two) times daily.    Dispense:  60 capsule    Refill:  5   ondansetron (ZOFRAN-ODT) 4 MG disintegrating tablet    Sig: Take 1 tablet (4 mg total) by mouth every 8 (eight) hours as needed for nausea or vomiting.    Dispense:  20 tablet    Refill:  0    Please fill script now   traMADol (ULTRAM) 50 MG tablet    Sig: ONE TAB PO BID PRN FOR PAIN AND HEADCAHES    Dispense:  20 tablet    Refill:  3   triamcinolone cream (KENALOG) 0.1 %    Sig: Apply 1 Application topically 2 (two) times daily as needed (itching and rash).    Dispense:  453.6 g    Refill:  1    For future refills   topiramate (TOPAMAX) 50 MG tablet    Sig: Take 1 tablet (50 mg total) by mouth every evening.    Dispense:  30 tablet    Refill:  5    Return in about 4 months (around 05/03/2022) for F/U, Aysiah Jurado PCP routine.   Total time spent:30 Minutes Time spent includes review of chart, medications, test results, and follow up plan with the patient.   [place] Controlled Substance Database was reviewed by me.  This patient  was seen by Jonetta Osgood, FNP-C in collaboration with Dr. Clayborn Bigness as a part of collaborative care agreement.  Nahomi Hegner R. Valetta Fuller, MSN, FNP-C Internal medicine

## 2022-01-04 LAB — MICROSCOPIC EXAMINATION
Casts: NONE SEEN /lpf
Epithelial Cells (non renal): >10 /hpf — AB (ref 0–10)
RBC, Urine: NONE SEEN /hpf (ref 0–2)

## 2022-01-04 LAB — UA/M W/RFLX CULTURE, ROUTINE
Bilirubin, UA: NEGATIVE
Glucose, UA: NEGATIVE
Ketones, UA: NEGATIVE
Leukocytes,UA: NEGATIVE
Nitrite, UA: NEGATIVE
Protein,UA: NEGATIVE
RBC, UA: NEGATIVE
Specific Gravity, UA: 1.019 (ref 1.005–1.030)
Urobilinogen, Ur: 1 mg/dL (ref 0.2–1.0)
pH, UA: 8 — ABNORMAL HIGH (ref 5.0–7.5)

## 2022-01-04 LAB — URINE CULTURE, REFLEX

## 2022-01-14 ENCOUNTER — Telehealth: Payer: Self-pay

## 2022-01-14 NOTE — Telephone Encounter (Signed)
Called pt to let her know once note is done by alyssa we would give her a call.

## 2022-01-15 DIAGNOSIS — R300 Dysuria: Secondary | ICD-10-CM | POA: Diagnosis not present

## 2022-01-15 DIAGNOSIS — E782 Mixed hyperlipidemia: Secondary | ICD-10-CM | POA: Diagnosis not present

## 2022-01-15 DIAGNOSIS — J4520 Mild intermittent asthma, uncomplicated: Secondary | ICD-10-CM | POA: Diagnosis not present

## 2022-01-15 DIAGNOSIS — R1114 Bilious vomiting: Secondary | ICD-10-CM | POA: Diagnosis not present

## 2022-01-15 DIAGNOSIS — Z87892 Personal history of anaphylaxis: Secondary | ICD-10-CM | POA: Diagnosis not present

## 2022-01-15 DIAGNOSIS — G43009 Migraine without aura, not intractable, without status migrainosus: Secondary | ICD-10-CM | POA: Diagnosis not present

## 2022-01-15 DIAGNOSIS — J301 Allergic rhinitis due to pollen: Secondary | ICD-10-CM | POA: Diagnosis not present

## 2022-01-15 DIAGNOSIS — E559 Vitamin D deficiency, unspecified: Secondary | ICD-10-CM | POA: Diagnosis not present

## 2022-01-15 DIAGNOSIS — F411 Generalized anxiety disorder: Secondary | ICD-10-CM | POA: Diagnosis not present

## 2022-01-15 DIAGNOSIS — R569 Unspecified convulsions: Secondary | ICD-10-CM | POA: Diagnosis not present

## 2022-01-15 DIAGNOSIS — E039 Hypothyroidism, unspecified: Secondary | ICD-10-CM | POA: Diagnosis not present

## 2022-01-15 DIAGNOSIS — R112 Nausea with vomiting, unspecified: Secondary | ICD-10-CM | POA: Diagnosis not present

## 2022-01-16 LAB — LIPID PANEL
Chol/HDL Ratio: 4.6 ratio — ABNORMAL HIGH (ref 0.0–4.4)
Cholesterol, Total: 155 mg/dL (ref 100–199)
HDL: 34 mg/dL — ABNORMAL LOW (ref >39)
LDL Chol Calc (NIH): 103 mg/dL — ABNORMAL HIGH (ref 0–99)
Triglycerides: 97 mg/dL (ref 0–149)
VLDL Cholesterol Cal: 18 mg/dL (ref 5–40)

## 2022-01-16 LAB — CMP14+EGFR
ALT: 15 IU/L (ref 0–32)
AST: 16 IU/L (ref 0–40)
Albumin/Globulin Ratio: 1.4 (ref 1.2–2.2)
Albumin: 4 g/dL (ref 3.9–4.9)
Alkaline Phosphatase: 115 IU/L (ref 44–121)
BUN/Creatinine Ratio: 7 — ABNORMAL LOW (ref 9–23)
BUN: 5 mg/dL — ABNORMAL LOW (ref 6–20)
Bilirubin Total: 0.5 mg/dL (ref 0.0–1.2)
CO2: 24 mmol/L (ref 20–29)
Calcium: 8.8 mg/dL (ref 8.7–10.2)
Chloride: 101 mmol/L (ref 96–106)
Creatinine, Ser: 0.7 mg/dL (ref 0.57–1.00)
Globulin, Total: 2.8 g/dL (ref 1.5–4.5)
Glucose: 99 mg/dL (ref 70–99)
Potassium: 4.1 mmol/L (ref 3.5–5.2)
Sodium: 138 mmol/L (ref 134–144)
Total Protein: 6.8 g/dL (ref 6.0–8.5)
eGFR: 114 mL/min/{1.73_m2} (ref >59)

## 2022-01-16 LAB — CBC WITH DIFFERENTIAL/PLATELET
Basophils Absolute: 0 10*3/uL (ref 0.0–0.2)
Basos: 0 %
EOS (ABSOLUTE): 0 10*3/uL (ref 0.0–0.4)
Eos: 1 %
Hematocrit: 39.1 % (ref 34.0–46.6)
Hemoglobin: 13.4 g/dL (ref 11.1–15.9)
Immature Grans (Abs): 0 10*3/uL (ref 0.0–0.1)
Immature Granulocytes: 0 %
Lymphocytes Absolute: 1.4 10*3/uL (ref 0.7–3.1)
Lymphs: 28 %
MCH: 30.7 pg (ref 26.6–33.0)
MCHC: 34.3 g/dL (ref 31.5–35.7)
MCV: 90 fL (ref 79–97)
Monocytes Absolute: 0.3 10*3/uL (ref 0.1–0.9)
Monocytes: 6 %
Neutrophils Absolute: 3.2 10*3/uL (ref 1.4–7.0)
Neutrophils: 65 %
Platelets: 198 10*3/uL (ref 150–450)
RBC: 4.36 x10E6/uL (ref 3.77–5.28)
RDW: 13.8 % (ref 11.7–15.4)
WBC: 4.9 10*3/uL (ref 3.4–10.8)

## 2022-01-16 LAB — B12 AND FOLATE PANEL
Folate: 3.5 ng/mL (ref >3.0)
Vitamin B-12: 300 pg/mL (ref 232–1245)

## 2022-01-16 LAB — TSH+FREE T4
Free T4: 1.02 ng/dL (ref 0.82–1.77)
TSH: 1.99 u[IU]/mL (ref 0.450–4.500)

## 2022-01-16 LAB — VITAMIN D 25 HYDROXY (VIT D DEFICIENCY, FRACTURES): Vit D, 25-Hydroxy: 9.2 ng/mL — ABNORMAL LOW (ref 30.0–100.0)

## 2022-01-20 ENCOUNTER — Telehealth: Payer: Self-pay | Admitting: Nurse Practitioner

## 2022-01-20 ENCOUNTER — Encounter: Payer: Self-pay | Admitting: Psychiatry

## 2022-01-20 ENCOUNTER — Telehealth (INDEPENDENT_AMBULATORY_CARE_PROVIDER_SITE_OTHER): Payer: Medicare Other | Admitting: Psychiatry

## 2022-01-20 DIAGNOSIS — F4310 Post-traumatic stress disorder, unspecified: Secondary | ICD-10-CM | POA: Diagnosis not present

## 2022-01-20 DIAGNOSIS — F3342 Major depressive disorder, recurrent, in full remission: Secondary | ICD-10-CM

## 2022-01-20 DIAGNOSIS — F5105 Insomnia due to other mental disorder: Secondary | ICD-10-CM

## 2022-01-20 MED ORDER — BUPROPION HCL 75 MG PO TABS: 75.0000 mg | ORAL_TABLET | Freq: Every day | ORAL | 0 refills | Status: DC

## 2022-01-20 MED ORDER — TRAZODONE HCL 50 MG PO TABS: 25.0000 mg | ORAL_TABLET | Freq: Every day | ORAL | 0 refills | Status: DC

## 2022-01-20 NOTE — Progress Notes (Unsigned)
Virtual Visit via Video Note  I connected with Teresa Crawford on 01/20/22 at 10:20 AM EDT by a video enabled telemedicine application and verified that I am speaking with the correct person using two identifiers.  Location Provider Location : [facility] Patient Location : Home  Participants: Patient , Provider    I discussed the limitations of evaluation and management by telemedicine and the availability of in person appointments. The patient expressed understanding and agreed to proceed.   I discussed the assessment and treatment plan with the patient. The patient was provided an opportunity to ask questions and all were answered. The patient agreed with the plan and demonstrated an understanding of the instructions.   The patient was advised to call back or seek an in-person evaluation if the symptoms worsen or if the condition fails to improve as anticipated.   [place] MD OP Progress Note  01/21/2022 11:32 AM Teresa Crawford  MRN:  [id number]  Chief Complaint:  Chief Complaint  Patient presents with   Follow-up   Anxiety   Medication Refill   HPI: Teresa Crawford is a 36 year old Caucasian female who is single, lives in [place], has a history of MDD, PTSD, insomnia, bereavement was evaluated by telemedicine today.  Patient today reports although anxious he has been coping okay.  She has started doing art projects making ornaments for the holidays, that has been very helpful with her anxiety and helps her to cope better.  Denies any depression symptoms.  Reports sleep is good.  Denies any appetite changes.  Denies any suicidality, homicidality or perceptual disturbances.  Patient denies any side effects to her medications like Wellbutrin, trazodone.  Compliant on them.  Continues to follow-up with therapist.  Denies any other concerns today.  Visit Diagnosis:    ICD-10-CM   1. MDD (major depressive disorder), recurrent, in full remission ([place])  F33.42 buPROPion  (WELLBUTRIN) 75 MG tablet    2. Posttraumatic stress disorder  F43.10     3. Insomnia due to mental condition  F51.05 traZODone (DESYREL) 50 MG tablet   depression      Past Psychiatric History: Reviewed past psychiatric history from progress note on 08/16/2018.  Past Medical History:  Past Medical History:  Diagnosis Date   B12 deficiency anemia    Chronic back pain    Chronic fatigue syndrome    Constipation    Depression    Elevated liver enzymes    Endometriosis    Fibroids    Hepatic steatosis    Hypothyroid    Last menstrual period (LMP) > 10 days ago 09/12/15   Mental retardation    Migraine    Post traumatic stress disorder    Posttraumatic stress disorder    PUD (peptic ulcer disease)    Scoliosis    Splenomegaly    Uterine fibroid     Past Surgical History:  Procedure Laterality Date   ABDOMINAL HYSTERECTOMY     BACK SURGERY     COLONOSCOPY  04/27/2006   Wohl-colitis, internal hemorrhoids   COLONOSCOPY  02/2013   focal right colitis, left colon biopsy negative, focal active proctitis without chronicity   ESOPHAGOGASTRODUODENOSCOPY  02/2013   multiple peptic & duodenal ulcers, negative h pylori   INTRAUTERINE DEVICE INSERTION  2012   LAPAROSCOPIC ENDOMETRIOSIS FULGURATION  2015   [name]   OVARIAN CYST REMOVAL      Family Psychiatric History: Reviewed family psychiatric history from progress note on 08/16/2018.  Family History:  Family History  Problem  Relation Age of Onset   Breast cancer Mother 40   COPD Father    Lung cancer Father 78   Dementia Father    Diabetes Sister    Diabetes Brother    Ovarian cancer Paternal Aunt    Colon cancer Maternal Grandfather 61   Liver disease Maternal Aunt        ?etiology   Lymphoma Maternal Grandmother 30   Lung cancer Other 43       maternal great grandfather   Stomach cancer Other 34       maternal great grandmother    Social History: Reviewed social history from progress note on 08/16/2018. Social  History   Socioeconomic History   Marital status: Single    Spouse name: Not on file   Number of children: 0   Years of education: Not on file   Highest education level: Not on file  Occupational History   Not on file  Tobacco Use   Smoking status: Never   Smokeless tobacco: Never  Vaping Use   Vaping Use: Never used  Substance and Sexual Activity   Alcohol use: Not Currently    Alcohol/week: 0.0 standard drinks of alcohol   Drug use: No   Sexual activity: Not Currently  Other Topics Concern   Not on file  Social History Narrative   She has brother, half brother & sister live nearby   Both parents deceased   Care managed by [name]   Social Determinants of Health   Financial Resource Strain: [place]  (02/11/2017)   Overall Financial Resource Strain (CARDIA)    Difficulty of Paying Living Expenses: Not hard at all  Food Insecurity: No Food Insecurity (02/11/2017)   Hunger Vital Sign    Worried About Running Out of Food in the Last Year: Never true    [place] in the Last Year: Never true  Transportation Needs: No Transportation Needs (02/11/2017)   PRAPARE - Hydrologist (Medical): No    Lack of Transportation (Non-Medical): No  Physical Activity: Inactive (02/11/2017)   Exercise Vital Sign    Days of Exercise per Week: 0 days    Minutes of Exercise per Session: 0 min  Stress: No Stress Concern Present (02/11/2017)   [place]    Feeling of Stress : Not at all  Social Connections: Unknown (02/11/2017)   Social Connection and Isolation Panel [NHANES]    Frequency of Communication with Friends and Family: Patient refused    Frequency of Social Gatherings with Friends and Family: Patient refused    Attends Religious Services: Patient refused    Active Member of Clubs or Organizations: Patient refused    Attends Archivist Meetings: Patient refused    Marital  Status: Patient refused    Allergies:  Allergies  Allergen Reactions   Other Anaphylaxis    Peanut butter, uses Epi pen   Peanut Butter Flavor Swelling    Throat swells Throat swells   Sertraline Other (See Comments)    Panic attacks   Codeine Nausea And Vomiting    nausea   Paroxetine Other (See Comments)    Panic Attack    Paroxetine Hcl Other (See Comments)    Panic attack Other reaction(s): Other (See Comments) Panic attacks   Prednisone & Diphenhydramine Rash   Venlafaxine Other (See Comments)    Panic Attack    Zoloft [Sertraline Hcl] Other (See Comments)  Panic attack    Metabolic Disorder Labs: Lab Results  Component Value Date   HGBA1C 5.5 12/20/2019   No results found for: "PROLACTIN" Lab Results  Component Value Date   CHOL 155 01/15/2022   TRIG 97 01/15/2022   HDL 34 (L) 01/15/2022   CHOLHDL 4.6 (H) 01/15/2022   LDLCALC 103 (H) 01/15/2022   LDLCALC 99 12/27/2020   Lab Results  Component Value Date   TSH 1.990 01/15/2022   TSH 1.750 12/27/2020    Therapeutic Level Labs: No results found for: "LITHIUM" No results found for: "VALPROATE" No results found for: "CBMZ"  Current Medications: Current Outpatient Medications  Medication Sig Dispense Refill   acetaminophen (TYLENOL) 500 MG tablet 2 TABS PO BID PRN FOR HEADACHES AND PAINS 45 tablet 5   albuterol (VENTOLIN HFA) 108 (90 Base) MCG/ACT inhaler Inhale 2 puffs into the lungs every 6 (six) hours as needed for wheezing or shortness of breath. 8 g 5   cetirizine (ZYRTEC ALLERGY) 10 MG tablet Take 1 tablet (10 mg total) by mouth daily. 90 tablet 1   docusate sodium (COLACE) 100 MG capsule TAKE 1 CAPSULE BY MOUTH ONCE DAILY FOR STOOL SOFTENER 90 capsule 1   EPINEPHrine 0.3 mg/0.3 mL IJ SOAJ injection USE FOR ANAPHYLAXIS 1 each 5   fluticasone (FLONASE) 50 MCG/ACT nasal spray Place 1 spray into both nostrils daily. 16 g 5   fluticasone (FLOVENT HFA) 220 MCG/ACT inhaler INHALE 2 PUFFS INTO THE  LUNGS, 2 TIMES PER DAY. RINSE MOUTH AFTER INHALATIONS 12 g 11   ibuprofen (ADVIL) 600 MG tablet ONE TAB PO BID PRN FOR PAIN AND HEADACHES 30 tablet 5   omeprazole (PRILOSEC) 40 MG capsule Take 1 capsule (40 mg total) by mouth 2 (two) times daily. 60 capsule 5   ondansetron (ZOFRAN-ODT) 4 MG disintegrating tablet Take 1 tablet (4 mg total) by mouth every 8 (eight) hours as needed for nausea or vomiting. 20 tablet 0   tamsulosin (FLOMAX) 0.4 MG CAPS capsule      topiramate (TOPAMAX) 50 MG tablet Take 1 tablet (50 mg total) by mouth every evening. 30 tablet 5   traMADol (ULTRAM) 50 MG tablet ONE TAB PO BID PRN FOR PAIN AND HEADCAHES 20 tablet 3   buPROPion (WELLBUTRIN) 75 MG tablet Take 1 tablet (75 mg total) by mouth daily with breakfast. 90 tablet 0   traZODone (DESYREL) 50 MG tablet Take 0.5 tablets (25 mg total) by mouth at bedtime. 45 tablet 0   triamcinolone cream (KENALOG) 0.1 % Apply 1 Application topically 2 (two) times daily as needed (itching and rash). (Patient not taking: Reported on 01/20/2022) 453.6 g 1   No current facility-administered medications for this visit.     Musculoskeletal: Strength & Muscle Tone:  UTA Gait & Station:  Seated Patient leans: N/A  Psychiatric Specialty Exam: Review of Systems  Psychiatric/Behavioral:  The patient is nervous/anxious.   All other systems reviewed and are negative.   Last menstrual period 12/06/2015.There is no height or weight on file to calculate BMI.  General Appearance: Casual  Eye Contact:  Fair  Speech:  Normal Rate  Volume:  Normal  Mood:  Anxious coping well  Affect:  Congruent  Thought Process:  Goal Directed and Descriptions of Associations: Intact  Orientation:  Full (Time, Place, and Person)  Thought Content: Logical   Suicidal Thoughts:  No  Homicidal Thoughts:  No  Memory:  Immediate;   Fair Recent;   Fair Remote;   Fair  Judgement:  Fair  Insight:  Fair  Psychomotor Activity:  Normal  Concentration:   Concentration: Fair and Attention Span: Fair  Recall:  [employer] of Knowledge: Fair  Language: Fair  Akathisia:  No  Handed:  Right  AIMS (if indicated): done  Assets:  Communication Skills Desire for Improvement Housing Social Support Talents/Skills  ADL's:  Intact  Cognition: WNL  Sleep:  Fair   Screenings: AIMS    Flowsheet Row Video Visit from 01/20/2022 in [facility] Video Visit from 10/22/2021 in [facility] Total Score 0 0      GAD-7    Flowsheet Row Video Visit from 01/20/2022 in [facility] Video Visit from 10/22/2021 in [facility] Office Visit from 04/08/2021 in [facility]  Total GAD-7 Score 1 0 [place] Office Visit from 01/01/2022 in [facility] [place] from 12/25/2020 in [facility] [place] from 12/19/2019 in [facility], [place] from 10/13/2017 in [facility]  Total Score (max 30 points ) '29 29 30 27      '$ PHQ2-9    Flowsheet Row Video Visit from 01/20/2022 in [facility] Video Visit from 10/22/2021 in [facility] from 09/30/2021 in [facility] Visit from 09/03/2021 in [facility] Video Visit from 07/22/2021 in [place]  PHQ-2 Total Score 0 0 2 0 0  PHQ-9 Total Score -- -- 11 -- --      Flowsheet Row Video Visit from 01/20/2022 in [facility] from 12/09/2021 in [facility] Video Visit from 10/22/2021 in [facility] No Risk No Risk No Risk        Assessment and Plan: Teresa Crawford is a 36 year old Caucasian female who  has a history of MDD, PTSD, insomnia, currently works part-time, on SSD was evaluated by telemedicine today.  Patient although anxious has been coping better, will benefit from following plan.  Plan MDD in remission Wellbutrin 75 mg p.o. daily Trazodone 25 mg p.o. nightly Continue CBT with Ms. Christina Hussami  PTSD-stable Continue CBT  Insomnia-stable Trazodone 25-50 mg p.o. nightly  Follow-up in clinic in 3 months or sooner in person.    Consent: Patient/Guardian gives verbal consent for treatment and assignment of benefits for services provided during this visit. Patient/Guardian expressed understanding and agreed to proceed.    This note was generated in part or whole with voice recognition software. Voice recognition is usually quite accurate but there are transcription errors that can and very often do occur. I apologize for any typographical errors that were not detected and corrected.     Ursula Alert, MD 01/21/2022, 11:32 AM

## 2022-01-20 NOTE — Telephone Encounter (Signed)
Notified patient that physical paperwork is ready to be picked up-Teresa Crawford

## 2022-01-27 ENCOUNTER — Ambulatory Visit (INDEPENDENT_AMBULATORY_CARE_PROVIDER_SITE_OTHER): Payer: Medicare Other | Admitting: Licensed Clinical Social Worker

## 2022-01-27 DIAGNOSIS — F4310 Post-traumatic stress disorder, unspecified: Secondary | ICD-10-CM | POA: Diagnosis not present

## 2022-01-27 NOTE — Plan of Care (Signed)
  Problem: Reduce the negative impact trauma related symptoms have on social, occupational, and family functioning. Goal: LTG: Reduce frequency, intensity, and duration of PTSD symptoms so daily functioning is improved: Input needed on appropriate metric.  Patient will score less than 5 on the pt self report (1-10) Outcome: Not Progressing Goal: STG: Practice interpersonal effectiveness skills 7 times per week for the next 16 weeks Outcome: Not Progressing Intervention: Encourage verbalization of feelings/concerns/expectations Note: Allowed/explored Intervention: Encourage self-care activities Note: Continued to encourage   Problem: Depression Goal:  Decrease depressive symptoms and improve levels of effective functioning-pt reports a decrease in overall depression symptoms 3 out of 5 sessions documented.  Outcome: Not Progressing Goal: Develop healthy thinking patterns and beliefs about self, others, and the world that lead to the alleviation and help prevent the relapse of depression per self report 3 out of 5 sessions documented.   Outcome: Not Progressing Intervention: REVIEW PLEASE SKILLS (TREAT PHYSICAL ILLNESS, BALANCE EATING, AVOID MOOD-ALTERING SUBSTANCES, BALANCE SLEEP AND GET EXERCISE) WITH Teresa Crawford Note: Encouraged

## 2022-01-27 NOTE — Progress Notes (Signed)
Virtual Visit via Video Note  I connected with [name] on 01/27/22 at 11:00 AM EDT by a video enabled telemedicine application and verified that I am speaking with the correct person using two identifiers.  Video connection was lost when less than 50% of the duration of the visit was complete, at which time the remainder of the visit was completed via audio only.  Location: Patient: home Provider: remote office [place], [name]   I discussed the limitations of evaluation and management by telemedicine and the availability of in person appointments. The patient expressed understanding and agreed to proceed.  I discussed the assessment and treatment plan with the patient. The patient was provided an opportunity to ask questions and all were answered. The patient agreed with the plan and demonstrated an understanding of the instructions.   The patient was advised to call back or seek an in-person evaluation if the symptoms worsen or if the condition fails to improve as anticipated.  I provided  60 minutes of non-face-to-face time during this encounter.   [place], LCSW   THERAPIST PROGRESS NOTE  Session Time: 11a-12p  Participation Level: Active  Behavioral Response: NeatAlertAnxious and Depressed  Type of Therapy: Individual Therapy  ProgressTowards Goals: Progressing  Treatment Goals addressed: Problem: Reduce the negative impact trauma related symptoms have on social, occupational, and family functioning. Goal: LTG: Reduce frequency, intensity, and duration of PTSD symptoms so daily functioning is improved: Input needed on appropriate metric.  Patient will score less than 5 on the pt self report (1-10) Outcome: Progressing Goal: STG: Practice interpersonal effectiveness skills 7 times per week for the next 16 weeks Outcome: Progressing Intervention: Encourage verbalization of feelings/concerns/expectations Note: Allowed/explored Intervention: Encourage  self-care activities Note: Continued to encourage   Problem: Depression Goal:  Decrease depressive symptoms and improve levels of effective functioning-pt reports a decrease in overall depression symptoms 3 out of 5 sessions documented.  Outcome: Progressing Goal: Develop healthy thinking patterns and beliefs about self, others, and the world that lead to the alleviation and help prevent the relapse of depression per self report 3 out of 5 sessions documented.   Outcome: Progressing Intervention: REVIEW PLEASE SKILLS (TREAT PHYSICAL ILLNESS, BALANCE EATING, AVOID MOOD-ALTERING SUBSTANCES, BALANCE SLEEP AND GET EXERCISE) WITH [name] Note: Encouraged     Interventions:  CBT, trauma focused; grief counseling   Summary: DARIELLE HANCHER is a 36 y.o. female who presents with improving symptoms related to PTSD diagnosis.   Allowed pt to explore and express thoughts and feelings associated with recent life situations and external stressors. Discussed pts current relationship and how they have decided to "go on a break"--it was Pedro's idea. Pt states that his paycheck got cut at work and he's having some financial difficulties which was the primary trigger to pt wanting to take a break from the relationship. Pt kept calling him her "twin flame" and that he has made a positive impact on her life. This situation was triggering for pt--she discussed other losses and how she has not accepted the loss of her friend dying. Pt states that she is managing her feelings okay--speaks with her sister regularly.  Pt states she is looking forward to spending time with her sister and [place] ([place]  Pt reports that relationships with friends and in the workplace are going well.   Reviewed coping skills to help manage grief/feelings of loss stemming from relationship change.   Continued recommendations  are as follows: self care behaviors, positive social  engagements, focusing on overall work/home/life balance, and focusing on positive physical and emotional wellness.   Suicidal/Homicidal: No  Therapist Response: Pt is continuing to apply interventions learned in session into daily life situations. Pt is currently on track to meet goals utilizing interventions mentioned above. Personal growth and progress noted. Treatment to continue as indicated.   Plan: Return again in 4 weeks.  Encounter Diagnosis  Name Primary?   Posttraumatic stress disorder Yes   Collaboration of Care: Other pt encouraged to continue care with psychiatrist of record, Dr. Shea Evans  Patient/Guardian was advised Release of Information must be obtained prior to any record release in order to collaborate their care with an outside provider. Patient/Guardian was advised if they have not already done so to contact the registration department to sign all necessary forms in order for [place] to release information regarding their care.   Consent: Patient/Guardian gives verbal consent for treatment and assignment of benefits for services provided during this visit. Patient/Guardian expressed understanding and agreed to proceed.    [place], LCSW 01/27/2022

## 2022-01-27 NOTE — Plan of Care (Signed)
  Problem: Reduce the negative impact trauma related symptoms have on social, occupational, and family functioning. Goal: LTG: Reduce frequency, intensity, and duration of PTSD symptoms so daily functioning is improved: Input needed on appropriate metric.  Patient will score less than 5 on the pt self report (1-10) Outcome: Progressing Goal: STG: Practice interpersonal effectiveness skills 7 times per week for the next 16 weeks Outcome: Progressing Intervention: Encourage verbalization of feelings/concerns/expectations Note: Allowed/explored Intervention: Encourage self-care activities Note: Continued to encourage   Problem: Depression Goal:  Decrease depressive symptoms and improve levels of effective functioning-pt reports a decrease in overall depression symptoms 3 out of 5 sessions documented.  Outcome: Progressing Goal: Develop healthy thinking patterns and beliefs about self, others, and the world that lead to the alleviation and help prevent the relapse of depression per self report 3 out of 5 sessions documented.   Outcome: Progressing Intervention: REVIEW PLEASE SKILLS (TREAT PHYSICAL ILLNESS, BALANCE EATING, AVOID MOOD-ALTERING SUBSTANCES, BALANCE SLEEP AND GET EXERCISE) WITH Teresa Crawford Note: Encouraged

## 2022-01-29 ENCOUNTER — Ambulatory Visit
Admission: RE | Admit: 2022-01-29 | Discharge: 2022-01-29 | Disposition: A | Discharge status: Home / Self Care | Payer: Medicare Other | Source: Ambulatory Visit | Attending: Nurse Practitioner | Admitting: Nurse Practitioner

## 2022-01-29 DIAGNOSIS — Z1231 Encounter for screening mammogram for malignant neoplasm of breast: Secondary | ICD-10-CM | POA: Diagnosis not present

## 2022-02-18 ENCOUNTER — Encounter: Payer: Self-pay | Admitting: Nurse Practitioner

## 2022-02-18 ENCOUNTER — Ambulatory Visit (INDEPENDENT_AMBULATORY_CARE_PROVIDER_SITE_OTHER): Payer: Medicare Other | Admitting: Nurse Practitioner

## 2022-02-18 VITALS — BP 100/79 | HR 83 | Temp 97.4°F | Resp 16 | Ht 63.0 in | Wt 185.8 lb

## 2022-02-18 DIAGNOSIS — N3000 Acute cystitis without hematuria: Secondary | ICD-10-CM

## 2022-02-18 DIAGNOSIS — R300 Dysuria: Secondary | ICD-10-CM | POA: Diagnosis not present

## 2022-02-18 LAB — POCT URINALYSIS DIPSTICK
Bilirubin, UA: NEGATIVE
Glucose, UA: NEGATIVE
Leukocytes, UA: NEGATIVE
Nitrite, UA: NEGATIVE
Protein, UA: NEGATIVE
Spec Grav, UA: 1.01 (ref 1.010–1.025)
Urobilinogen, UA: 0.2 E.U./dL
pH, UA: 8.5 — AB (ref 5.0–8.0)

## 2022-02-18 MED ORDER — NITROFURANTOIN MONOHYD MACRO 100 MG PO CAPS: 100.0000 mg | ORAL_CAPSULE | Freq: Two times a day (BID) | ORAL | 0 refills | Status: AC

## 2022-02-18 MED ORDER — PHENAZOPYRIDINE HCL 200 MG PO TABS: 200.0000 mg | ORAL_TABLET | Freq: Three times a day (TID) | ORAL | 0 refills | Status: AC | PRN

## 2022-02-18 NOTE — Progress Notes (Signed)
[facility] [place], [place] [address]  Internal MEDICINE  Office Visit Note  Patient Name: Teresa Crawford  [id number]  [id number]  Date of Service: 02/18/2022  Chief Complaint  Patient presents with   Acute Visit    Blood in urine      HPI Aslee presents for an acute sick visit for possible UTI.  --Blood in urine, per patient report Onset of symptoms was yesterday  Also reports Abdominal pain, suprapubic tenderness, Back pain Urinalysis- no blood, leukocytes, and negative for nitrites    Current Medication:  Outpatient Encounter Medications as of 02/18/2022  Medication Sig   acetaminophen (TYLENOL) 500 MG tablet 2 TABS PO BID PRN FOR HEADACHES AND PAINS   albuterol (VENTOLIN HFA) 108 (90 Base) MCG/ACT inhaler Inhale 2 puffs into the lungs every 6 (six) hours as needed for wheezing or shortness of breath.   buPROPion (WELLBUTRIN) 75 MG tablet Take 1 tablet (75 mg total) by mouth daily with breakfast.   cetirizine (ZYRTEC ALLERGY) 10 MG tablet Take 1 tablet (10 mg total) by mouth daily.   docusate sodium (COLACE) 100 MG capsule TAKE 1 CAPSULE BY MOUTH ONCE DAILY FOR STOOL SOFTENER   EPINEPHrine 0.3 mg/0.3 mL IJ SOAJ injection USE FOR ANAPHYLAXIS   fluticasone (FLONASE) 50 MCG/ACT nasal spray Place 1 spray into both nostrils daily.   fluticasone (FLOVENT HFA) 220 MCG/ACT inhaler INHALE 2 PUFFS INTO THE LUNGS, 2 TIMES PER DAY. RINSE MOUTH AFTER INHALATIONS   ibuprofen (ADVIL) 600 MG tablet ONE TAB PO BID PRN FOR PAIN AND HEADACHES   nitrofurantoin, macrocrystal-monohydrate, (MACROBID) 100 MG capsule Take 1 capsule (100 mg total) by mouth 2 (two) times daily for 7 days. Take with food   omeprazole (PRILOSEC) 40 MG capsule Take 1 capsule (40 mg total) by mouth 2 (two) times daily.   ondansetron (ZOFRAN-ODT) 4 MG disintegrating tablet Take 1 tablet (4 mg total) by mouth every 8 (eight) hours as needed for nausea or vomiting.   phenazopyridine (PYRIDIUM)  200 MG tablet Take 1 tablet (200 mg total) by mouth 3 (three) times daily as needed for up to 10 days for pain.   tamsulosin (FLOMAX) 0.4 MG CAPS capsule    topiramate (TOPAMAX) 50 MG tablet Take 1 tablet (50 mg total) by mouth every evening.   traMADol (ULTRAM) 50 MG tablet ONE TAB PO BID PRN FOR PAIN AND HEADCAHES   traZODone (DESYREL) 50 MG tablet Take 0.5 tablets (25 mg total) by mouth at bedtime.   triamcinolone cream (KENALOG) 0.1 % Apply 1 Application topically 2 (two) times daily as needed (itching and rash).   No facility-administered encounter medications on file as of 02/18/2022.      Medical History: Past Medical History:  Diagnosis Date   B12 deficiency anemia    Chronic back pain    Chronic fatigue syndrome    Constipation    Depression    Elevated liver enzymes    Endometriosis    Fibroids    Hepatic steatosis    Hypothyroid    Last menstrual period (LMP) > 10 days ago 09/12/15   Mental retardation    Migraine    Post traumatic stress disorder    Posttraumatic stress disorder    PUD (peptic ulcer disease)    Scoliosis    Splenomegaly    Uterine fibroid      Vital Signs: BP 100/79   Pulse 83   Temp (!) 97.4 F (36.3 C)   Resp 16  Ht '5\' 3"'$  (1.6 m)   Wt 185 lb 12.8 oz (84.3 kg)   LMP 12/06/2015   SpO2 99%   BMI 32.91 kg/m    Review of Systems  Respiratory: Negative.  Negative for cough, chest tightness, shortness of breath and wheezing.   Cardiovascular: Negative.  Negative for chest pain and palpitations.  Gastrointestinal:  Positive for abdominal pain. Negative for blood in stool, constipation, diarrhea, nausea and vomiting.  Genitourinary:  Positive for difficulty urinating, dysuria, flank pain, frequency, hematuria and urgency.  Musculoskeletal:  Positive for back pain.    Physical Exam Vitals reviewed.  Constitutional:      General: She is not in acute distress.    Appearance: Normal appearance. She is obese. She is not ill-appearing.   HENT:     Head: Normocephalic and atraumatic.  Eyes:     Pupils: Pupils are equal, round, and reactive to light.  Cardiovascular:     Rate and Rhythm: Normal rate and regular rhythm.  Pulmonary:     Effort: Pulmonary effort is normal. No respiratory distress.  Neurological:     Mental Status: She is alert.  Psychiatric:        Mood and Affect: Mood normal.        Behavior: Behavior normal.       Assessment/Plan: 1. Acute cystitis without hematuria Urine culture sent. Empiric treatment for UTI prescribed.  - CULTURE, URINE COMPREHENSIVE - nitrofurantoin, macrocrystal-monohydrate, (MACROBID) 100 MG capsule; Take 1 capsule (100 mg total) by mouth 2 (two) times daily for 7 days. Take with food  Dispense: 14 capsule; Refill: 0 - phenazopyridine (PYRIDIUM) 200 MG tablet; Take 1 tablet (200 mg total) by mouth 3 (three) times daily as needed for up to 10 days for pain.  Dispense: 30 tablet; Refill: 0  2. Dysuria Negative but patient having symptoms, will send culture anyway.  - POCT urinalysis dipstick   General Counseling: Berda verbalizes understanding of the findings of todays visit and agrees with plan of treatment. I have discussed any further diagnostic evaluation that may be needed or ordered today. We also reviewed her medications today. she has been encouraged to call the office with any questions or concerns that should arise related to todays visit.    Counseling:    Orders Placed This Encounter  Procedures   CULTURE, URINE COMPREHENSIVE   POCT urinalysis dipstick    Meds ordered this encounter  Medications   nitrofurantoin, macrocrystal-monohydrate, (MACROBID) 100 MG capsule    Sig: Take 1 capsule (100 mg total) by mouth 2 (two) times daily for 7 days. Take with food    Dispense:  14 capsule    Refill:  0   phenazopyridine (PYRIDIUM) 200 MG tablet    Sig: Take 1 tablet (200 mg total) by mouth 3 (three) times daily as needed for up to 10 days for pain.     Dispense:  30 tablet    Refill:  0    Return if symptoms worsen or fail to improve, for F/U, Demarri Elie PCP at regular follow up appt. .  [place] Controlled Substance Database was reviewed by me for overdose risk score (ORS)  Time spent:30 Minutes Time spent with patient included reviewing progress notes, labs, imaging studies, and discussing plan for follow up.   This patient was seen by Jonetta Osgood, FNP-C in collaboration with Dr. Clayborn Bigness as a part of collaborative care agreement.  Nicolai Labonte R. Valetta Fuller, MSN, FNP-C Internal Medicine

## 2022-02-24 LAB — CULTURE, URINE COMPREHENSIVE

## 2022-03-16 ENCOUNTER — Ambulatory Visit (HOSPITAL_COMMUNITY): Payer: Medicare Other | Admitting: Licensed Clinical Social Worker

## 2022-04-13 ENCOUNTER — Encounter: Payer: Self-pay | Admitting: Physician Assistant

## 2022-04-13 ENCOUNTER — Telehealth (INDEPENDENT_AMBULATORY_CARE_PROVIDER_SITE_OTHER): Payer: Medicare Other | Admitting: Physician Assistant

## 2022-04-13 VITALS — Ht 63.0 in | Wt 185.0 lb

## 2022-04-13 DIAGNOSIS — R42 Dizziness and giddiness: Secondary | ICD-10-CM | POA: Diagnosis not present

## 2022-04-13 MED ORDER — MECLIZINE HCL 12.5 MG PO TABS: ORAL_TABLET | ORAL | 1 refills | Status: DC

## 2022-04-13 NOTE — Progress Notes (Signed)
[facility] [place], [place] [address]  Internal MEDICINE  Telephone Visit  Patient Name: Teresa Crawford  [id number]  [id number]  Date of Service: 04/13/2022  I connected with the patient at 8:32 by telephone and verified the patients identity using two identifiers.   I discussed the limitations, risks, security and privacy concerns of performing an evaluation and management service by telephone and the availability of in person appointments. I also discussed with the patient that there may be a patient responsible charge related to the service.  The patient expressed understanding and agrees to proceed.    Chief Complaint  Patient presents with   Telephone Assessment    [id number]   Telephone Screen        Ear Pain    Start last Tuesday    Dizziness    HPI Pt is here for virtual sick visit -She has been dizzy and tired since last Tuesday. Feels like it has been getting a little worse.  -Dizziness is all the time, worse when she walks or changes position -Feels like the floor is moving -ear ache the other night, but didn't have that at the start and only for one night. -no medication changes -drinking plenty of water and eating regularly -No CP, palpitations, or SOB -Sleeping well  Current Medication: Outpatient Encounter Medications as of 04/13/2022  Medication Sig   acetaminophen (TYLENOL) 500 MG tablet 2 TABS PO BID PRN FOR HEADACHES AND PAINS   albuterol (VENTOLIN HFA) 108 (90 Base) MCG/ACT inhaler Inhale 2 puffs into the lungs every 6 (six) hours as needed for wheezing or shortness of breath.   buPROPion (WELLBUTRIN) 75 MG tablet Take 1 tablet (75 mg total) by mouth daily with breakfast.   cetirizine (ZYRTEC ALLERGY) 10 MG tablet Take 1 tablet (10 mg total) by mouth daily.   docusate sodium (COLACE) 100 MG capsule TAKE 1 CAPSULE BY MOUTH ONCE DAILY FOR STOOL SOFTENER   EPINEPHrine 0.3 mg/0.3 mL IJ SOAJ injection USE FOR ANAPHYLAXIS    fluticasone (FLONASE) 50 MCG/ACT nasal spray Place 1 spray into both nostrils daily.   fluticasone (FLOVENT HFA) 220 MCG/ACT inhaler INHALE 2 PUFFS INTO THE LUNGS, 2 TIMES PER DAY. RINSE MOUTH AFTER INHALATIONS   ibuprofen (ADVIL) 600 MG tablet ONE TAB PO BID PRN FOR PAIN AND HEADACHES   meclizine (ANTIVERT) 12.5 MG tablet One tab po tid prn for dizziness   omeprazole (PRILOSEC) 40 MG capsule Take 1 capsule (40 mg total) by mouth 2 (two) times daily.   ondansetron (ZOFRAN-ODT) 4 MG disintegrating tablet Take 1 tablet (4 mg total) by mouth every 8 (eight) hours as needed for nausea or vomiting.   tamsulosin (FLOMAX) 0.4 MG CAPS capsule    topiramate (TOPAMAX) 50 MG tablet Take 1 tablet (50 mg total) by mouth every evening.   traMADol (ULTRAM) 50 MG tablet ONE TAB PO BID PRN FOR PAIN AND HEADCAHES   traZODone (DESYREL) 50 MG tablet Take 0.5 tablets (25 mg total) by mouth at bedtime.   triamcinolone cream (KENALOG) 0.1 % Apply 1 Application topically 2 (two) times daily as needed (itching and rash).   No facility-administered encounter medications on file as of 04/13/2022.    Surgical History: Past Surgical History:  Procedure Laterality Date   ABDOMINAL HYSTERECTOMY     BACK SURGERY     COLONOSCOPY  04/27/2006   Wohl-colitis, internal hemorrhoids   COLONOSCOPY  02/2013   focal right colitis, left colon biopsy negative, focal active proctitis  without chronicity   ESOPHAGOGASTRODUODENOSCOPY  02/2013   multiple peptic & duodenal ulcers, negative h pylori   INTRAUTERINE DEVICE INSERTION  2012   LAPAROSCOPIC ENDOMETRIOSIS FULGURATION  2015   [name]   OVARIAN CYST REMOVAL      Medical History: Past Medical History:  Diagnosis Date   B12 deficiency anemia    Chronic back pain    Chronic fatigue syndrome    Constipation    Depression    Elevated liver enzymes    Endometriosis    Fibroids    Hepatic steatosis    Hypothyroid    Last menstrual period (LMP) > 10 days ago 09/12/15   Mental  retardation    Migraine    Post traumatic stress disorder    Posttraumatic stress disorder    PUD (peptic ulcer disease)    Scoliosis    Splenomegaly    Uterine fibroid     Family History: Family History  Problem Relation Age of Onset   Breast cancer Mother 2   COPD Father    Lung cancer Father 62   Dementia Father    Diabetes Sister    Diabetes Brother    Ovarian cancer Paternal Aunt    Colon cancer Maternal Grandfather 59   Liver disease Maternal Aunt        ?etiology   Lymphoma Maternal Grandmother 21   Lung cancer Other 20       maternal great grandfather   Stomach cancer Other 74       maternal great grandmother    Social History   Socioeconomic History   Marital status: Single    Spouse name: Not on file   Number of children: 0   Years of education: Not on file   Highest education level: Not on file  Occupational History   Not on file  Tobacco Use   Smoking status: Never   Smokeless tobacco: Never  Vaping Use   Vaping Use: Never used  Substance and Sexual Activity   Alcohol use: Not Currently    Alcohol/week: 0.0 standard drinks of alcohol   Drug use: No   Sexual activity: Not Currently  Other Topics Concern   Not on file  Social History Narrative   She has brother, half brother & sister live nearby   Both parents deceased   Care managed by [name]   Social Determinants of Health   Financial Resource Strain: Low Risk  (02/11/2017)   Overall Financial Resource Strain (CARDIA)    Difficulty of Paying Living Expenses: Not hard at all  Food Insecurity: No Food Insecurity (02/11/2017)   Hunger Vital Sign    Worried About Running Out of Food in the Last Year: Never true    Ran Out of Food in the Last Year: Never true  Transportation Needs: No Transportation Needs (02/11/2017)   PRAPARE - Hydrologist (Medical): No    Lack of Transportation (Non-Medical): No  Physical Activity: Inactive (02/11/2017)   Exercise Vital  Sign    Days of Exercise per Week: 0 days    Minutes of Exercise per Session: 0 min  Stress: No Stress Concern Present (02/11/2017)   [place]    Feeling of Stress : Not at all  Social Connections: Unknown (02/11/2017)   Social Connection and Isolation Panel [NHANES]    Frequency of Communication with Friends and Family: Patient refused    Frequency of Social Gatherings with Friends  and Family: Patient refused    Attends Religious Services: Patient refused    Active Member of Clubs or Organizations: Patient refused    Attends Archivist Meetings: Patient refused    Marital Status: Patient refused  Intimate Partner Violence: Unknown (02/11/2017)   Humiliation, Afraid, Rape, and Kick questionnaire    Fear of Current or Ex-Partner: Patient refused    Emotionally Abused: Patient refused    Physically Abused: Patient refused    Sexually Abused: Patient refused      Review of Systems  Constitutional:  Positive for fatigue. Negative for fever.  HENT:  Negative for congestion, mouth sores and postnasal drip.   Respiratory:  Negative for cough.   Cardiovascular:  Negative for chest pain.  Genitourinary:  Negative for flank pain.  Neurological:  Positive for dizziness.  Psychiatric/Behavioral: Negative.      Vital Signs: Ht '5\' 3"'$  (1.6 m)   Wt 185 lb (83.9 kg)   LMP 12/06/2015   BMI 32.77 kg/m    Observation/Objective:  Pt is able to carry out conversation   Assessment/Plan: 1. Dizziness Will check labs and may start on meclizine to help with dizziness. Advised to stay well hydrated and eat regularly. Pt will call if not improving - meclizine (ANTIVERT) 12.5 MG tablet; One tab po tid prn for dizziness  Dispense: 30 tablet; Refill: 1 - CBC w/Diff/Platelet - Comprehensive metabolic panel   General Counseling: Laneah verbalizes understanding of the findings of today's phone visit and agrees with plan  of treatment. I have discussed any further diagnostic evaluation that may be needed or ordered today. We also reviewed her medications today. she has been encouraged to call the office with any questions or concerns that should arise related to todays visit.    Orders Placed This Encounter  Procedures   CBC w/Diff/Platelet   Comprehensive metabolic panel    Meds ordered this encounter  Medications   meclizine (ANTIVERT) 12.5 MG tablet    Sig: One tab po tid prn for dizziness    Dispense:  30 tablet    Refill:  1    Time spent:30 Minutes    Dr Lavera Guise Internal medicine

## 2022-04-14 LAB — CBC WITH DIFFERENTIAL/PLATELET
Basophils Absolute: 0 10*3/uL (ref 0.0–0.2)
Basos: 0 %
EOS (ABSOLUTE): 0.1 10*3/uL (ref 0.0–0.4)
Eos: 1 %
Hematocrit: 40.2 % (ref 34.0–46.6)
Hemoglobin: 13.4 g/dL (ref 11.1–15.9)
Immature Grans (Abs): 0 10*3/uL (ref 0.0–0.1)
Immature Granulocytes: 0 %
Lymphocytes Absolute: 1.3 10*3/uL (ref 0.7–3.1)
Lymphs: 21 %
MCH: 28.3 pg (ref 26.6–33.0)
MCHC: 33.3 g/dL (ref 31.5–35.7)
MCV: 85 fL (ref 79–97)
Monocytes Absolute: 0.4 10*3/uL (ref 0.1–0.9)
Monocytes: 7 %
Neutrophils Absolute: 4.3 10*3/uL (ref 1.4–7.0)
Neutrophils: 71 %
Platelets: 172 10*3/uL (ref 150–450)
RBC: 4.73 x10E6/uL (ref 3.77–5.28)
RDW: 12.8 % (ref 11.7–15.4)
WBC: 6.1 10*3/uL (ref 3.4–10.8)

## 2022-04-14 LAB — COMPREHENSIVE METABOLIC PANEL
ALT: 11 IU/L (ref 0–32)
AST: 8 IU/L (ref 0–40)
Albumin/Globulin Ratio: 1.4 (ref 1.2–2.2)
Albumin: 4 g/dL (ref 3.9–4.9)
Alkaline Phosphatase: 113 IU/L (ref 44–121)
BUN/Creatinine Ratio: 9 (ref 9–23)
BUN: 7 mg/dL (ref 6–20)
Bilirubin Total: 0.3 mg/dL (ref 0.0–1.2)
CO2: 23 mmol/L (ref 20–29)
Calcium: 8.8 mg/dL (ref 8.7–10.2)
Chloride: 103 mmol/L (ref 96–106)
Creatinine, Ser: 0.75 mg/dL (ref 0.57–1.00)
Globulin, Total: 2.9 g/dL (ref 1.5–4.5)
Glucose: 95 mg/dL (ref 70–99)
Potassium: 3.7 mmol/L (ref 3.5–5.2)
Sodium: 140 mmol/L (ref 134–144)
Total Protein: 6.9 g/dL (ref 6.0–8.5)
eGFR: 105 mL/min/{1.73_m2} (ref >59)

## 2022-04-21 ENCOUNTER — Ambulatory Visit (INDEPENDENT_AMBULATORY_CARE_PROVIDER_SITE_OTHER): Payer: Medicare Other | Admitting: Psychiatry

## 2022-04-21 ENCOUNTER — Encounter: Payer: Self-pay | Admitting: Psychiatry

## 2022-04-21 ENCOUNTER — Telehealth: Payer: Self-pay

## 2022-04-21 VITALS — BP 112/78 | HR 85 | Temp 97.2°F | Ht 63.0 in | Wt 187.4 lb

## 2022-04-21 DIAGNOSIS — F4310 Post-traumatic stress disorder, unspecified: Secondary | ICD-10-CM | POA: Diagnosis not present

## 2022-04-21 DIAGNOSIS — F5105 Insomnia due to other mental disorder: Secondary | ICD-10-CM | POA: Diagnosis not present

## 2022-04-21 DIAGNOSIS — F3342 Major depressive disorder, recurrent, in full remission: Secondary | ICD-10-CM

## 2022-04-21 MED ORDER — TRAZODONE HCL 50 MG PO TABS: 25.0000 mg | ORAL_TABLET | Freq: Every day | ORAL | 0 refills | Status: DC

## 2022-04-21 MED ORDER — BUPROPION HCL 75 MG PO TABS: 75.0000 mg | ORAL_TABLET | Freq: Every day | ORAL | 0 refills | Status: DC

## 2022-04-21 NOTE — Telephone Encounter (Signed)
Spoke with patient regarding lab results. 

## 2022-04-21 NOTE — Telephone Encounter (Signed)
-----  Message from Mylinda Latina, PA-C sent at 04/20/2022  4:51 PM EST ----- Please let her know her labs were normal

## 2022-04-21 NOTE — Progress Notes (Signed)
[facility] MD OP Progress Note  04/21/2022 3:54 PM Teresa Crawford  MRN:  [id number]  Chief Complaint:  Chief Complaint  Patient presents with   Follow-up   HPI: Teresa Crawford is a 37 year old Caucasian female who is single, lives in [place], has a history of MDD, PTSD, insomnia, was evaluated in the office today.  Patient today reports overall she is currently doing fairly well.  Denies any significant sadness, anxiety.  Reports sleep is good.  Reports appetite is fair.  She reports she recently got sick, had stuffiness in her ear, congestion, took over-the-counter medication-Mucinex.  Was also prescribed meclizine by her provider.  Currently on the same which helps.  Does not have any dizziness anymore.  Does have upcoming appointment with primary care provider coming up on January 25.  Plans to talk to primary provider at that visit.  Patient reports she continues to work, currently works only 1 day.  Work is going well.  Denies any side effects to medications, compliant on the Wellbutrin, trazodone.  Denies any suicidality, homicidality or perceptual disturbances.  Patient appeared to be alert, oriented to person place time situation.  Was able to spell the word 'world' forwards however had difficulty doing it backward.  Also had trouble with serial threes.  Was able to do 3 digits forward and backward however had trouble with 4 digits.  Collateral information obtained from Teresa Crawford -who reports patient is doing well.  They have been spending a lot of time cooking, talking and doing crafts.  Patient recently has been spending a lot of time doing diamond crafts.    Visit Diagnosis:    ICD-10-CM   1. MDD (major depressive disorder), recurrent, in full remission ([place])  F33.42 buPROPion (WELLBUTRIN) 75 MG tablet    2. Posttraumatic stress disorder  F43.10     3. Insomnia due to mental condition  F51.05 traZODone (DESYREL) 50 MG tablet   depression      Past  Psychiatric History: Reviewed past psych History from progress note on 08/16/2018.  Past Medical History:  Past Medical History:  Diagnosis Date   B12 deficiency anemia    Chronic back pain    Chronic fatigue syndrome    Constipation    Depression    Elevated liver enzymes    Endometriosis    Fibroids    Hepatic steatosis    Hypothyroid    Last menstrual period (LMP) > 10 days ago 09/12/15   Mental retardation    Migraine    Post traumatic stress disorder    Posttraumatic stress disorder    PUD (peptic ulcer disease)    Scoliosis    Splenomegaly    Uterine fibroid     Past Surgical History:  Procedure Laterality Date   ABDOMINAL HYSTERECTOMY     BACK SURGERY     COLONOSCOPY  04/27/2006   Wohl-colitis, internal hemorrhoids   COLONOSCOPY  02/2013   focal right colitis, left colon biopsy negative, focal active proctitis without chronicity   ESOPHAGOGASTRODUODENOSCOPY  02/2013   multiple peptic & duodenal ulcers, negative h pylori   INTRAUTERINE DEVICE INSERTION  2012   LAPAROSCOPIC ENDOMETRIOSIS FULGURATION  2015   [name]   OVARIAN CYST REMOVAL      Family Psychiatric History: Reviewed family psych History from progress note on 08/16/2018.  Family History:  Family History  Problem Relation Age of Onset   Breast cancer Mother 32   COPD Father    Lung cancer Father 56  Dementia Father    Diabetes Sister    Diabetes Brother    Ovarian cancer Paternal Aunt    Colon cancer Maternal Grandfather 66   Liver disease Maternal Aunt        ?etiology   Lymphoma Maternal Grandmother 36   Lung cancer Other 5       maternal great grandfather   Stomach cancer Other 14       maternal great grandmother    Social History: Reviewed social history from progress note on 08/16/2018. Social History   Socioeconomic History   Marital status: Single    Spouse name: Not on file   Number of children: 0   Years of education: Not on file   Highest education level: Not on file   Occupational History   Not on file  Tobacco Use   Smoking status: Never   Smokeless tobacco: Never  Vaping Use   Vaping Use: Never used  Substance and Sexual Activity   Alcohol use: Not Currently    Alcohol/week: 0.0 standard drinks of alcohol   Drug use: No   Sexual activity: Not Currently  Other Topics Concern   Not on file  Social History Narrative   She has brother, half brother & sister live nearby   Both parents deceased   Care managed by [name]   Social Determinants of Health   Financial Resource Strain: [place]  (02/11/2017)   Overall Financial Resource Strain (CARDIA)    Difficulty of Paying Living Expenses: Not hard at all  Food Insecurity: No Food Insecurity (02/11/2017)   Hunger Vital Sign    Worried About Running Out of Food in the Last Year: Never true    [place] in the Last Year: Never true  Transportation Needs: No Transportation Needs (02/11/2017)   PRAPARE - Hydrologist (Medical): No    Lack of Transportation (Non-Medical): No  Physical Activity: Inactive (02/11/2017)   Exercise Vital Sign    Days of Exercise per Week: 0 days    Minutes of Exercise per Session: 0 min  Stress: No Stress Concern Present (02/11/2017)   [place]    Feeling of Stress : Not at all  Social Connections: Unknown (02/11/2017)   Social Connection and Isolation Panel [NHANES]    Frequency of Communication with Friends and Family: Patient refused    Frequency of Social Gatherings with Friends and Family: Patient refused    Attends Religious Services: Patient refused    Active Member of Clubs or Organizations: Patient refused    Attends Archivist Meetings: Patient refused    Marital Status: Patient refused    Allergies:  Allergies  Allergen Reactions   Other Anaphylaxis    Peanut butter, uses Epi pen   Peanut Butter Flavor Swelling    Throat swells Throat  swells   Sertraline Other (See Comments)    Panic attacks   Codeine Nausea And Vomiting    nausea   Paroxetine Other (See Comments)    Panic Attack    Paroxetine Hcl Other (See Comments)    Panic attack Other reaction(s): Other (See Comments) Panic attacks   Prednisone & Diphenhydramine Rash   Venlafaxine Other (See Comments)    Panic Attack    Zoloft [Sertraline Hcl] Other (See Comments)    Panic attack    Metabolic Disorder Labs: Lab Results  Component Value Date   HGBA1C 5.5 12/20/2019  No results found for: "PROLACTIN" Lab Results  Component Value Date   CHOL 155 01/15/2022   TRIG 97 01/15/2022   HDL 34 (L) 01/15/2022   CHOLHDL 4.6 (H) 01/15/2022   LDLCALC 103 (H) 01/15/2022   LDLCALC 99 12/27/2020   Lab Results  Component Value Date   TSH 1.990 01/15/2022   TSH 1.750 12/27/2020    Therapeutic Level Labs: No results found for: "LITHIUM" No results found for: "VALPROATE" No results found for: "CBMZ"  Current Medications: Current Outpatient Medications  Medication Sig Dispense Refill   acetaminophen (TYLENOL) 500 MG tablet 2 TABS PO BID PRN FOR HEADACHES AND PAINS 45 tablet 5   albuterol (VENTOLIN HFA) 108 (90 Base) MCG/ACT inhaler Inhale 2 puffs into the lungs every 6 (six) hours as needed for wheezing or shortness of breath. 8 g 5   cetirizine (ZYRTEC ALLERGY) 10 MG tablet Take 1 tablet (10 mg total) by mouth daily. 90 tablet 1   docusate sodium (COLACE) 100 MG capsule TAKE 1 CAPSULE BY MOUTH ONCE DAILY FOR STOOL SOFTENER 90 capsule 1   EPINEPHrine 0.3 mg/0.3 mL IJ SOAJ injection USE FOR ANAPHYLAXIS 1 each 5   fluticasone (FLONASE) 50 MCG/ACT nasal spray Place 1 spray into both nostrils daily. 16 g 5   fluticasone (FLOVENT HFA) 220 MCG/ACT inhaler INHALE 2 PUFFS INTO THE LUNGS, 2 TIMES PER DAY. RINSE MOUTH AFTER INHALATIONS 12 g 11   ibuprofen (ADVIL) 600 MG tablet ONE TAB PO BID PRN FOR PAIN AND HEADACHES 30 tablet 5   meclizine (ANTIVERT) 12.5 MG tablet  One tab po tid prn for dizziness 30 tablet 1   omeprazole (PRILOSEC) 40 MG capsule Take 1 capsule (40 mg total) by mouth 2 (two) times daily. 60 capsule 5   ondansetron (ZOFRAN-ODT) 4 MG disintegrating tablet Take 1 tablet (4 mg total) by mouth every 8 (eight) hours as needed for nausea or vomiting. 20 tablet 0   tamsulosin (FLOMAX) 0.4 MG CAPS capsule      topiramate (TOPAMAX) 50 MG tablet Take 1 tablet (50 mg total) by mouth every evening. 30 tablet 5   traMADol (ULTRAM) 50 MG tablet ONE TAB PO BID PRN FOR PAIN AND HEADCAHES 20 tablet 3   triamcinolone cream (KENALOG) 0.1 % Apply 1 Application topically 2 (two) times daily as needed (itching and rash). 453.6 g 1   buPROPion (WELLBUTRIN) 75 MG tablet Take 1 tablet (75 mg total) by mouth daily with breakfast. 90 tablet 0   traZODone (DESYREL) 50 MG tablet Take 0.5 tablets (25 mg total) by mouth at bedtime. 45 tablet 0   No current facility-administered medications for this visit.     Musculoskeletal: Strength & Muscle Tone: within normal limits Gait & Station: normal Patient leans: N/A  Psychiatric Specialty Exam: Review of Systems  Neurological:  Positive for dizziness.  Psychiatric/Behavioral: Negative.    All other systems reviewed and are negative.   Blood pressure 112/78, pulse 85, temperature (!) 97.2 F (36.2 C), temperature source Oral, height '5\' 3"'$  (1.6 m), weight 187 lb 6.4 oz (85 kg), last menstrual period 12/06/2015.Body mass index is 33.2 kg/m.  General Appearance: Casual  Eye Contact:  Fair  Speech:  Clear and Coherent  Volume:  Normal  Mood:  Euthymic  Affect:  Congruent  Thought Process:  Goal Directed and Descriptions of Associations: Intact  Orientation:  Full (Time, Place, and Person)  Thought Content: Logical   Suicidal Thoughts:  No  Homicidal Thoughts:  No  Memory:  Immediate;   Fair Recent;   Fair Remote;   Fair  Judgement:  Fair  Insight:  Fair  Psychomotor Activity:  Normal  Concentration:   Concentration: Fair and Attention Span: Fair  Recall:  [employer] of Knowledge: Fair  Language: Fair  Akathisia:  No  Handed:  Right  AIMS (if indicated): not done  Assets:  Communication Skills Desire for Improvement Housing Social Support  ADL's:  Intact  Cognition: WNL  Sleep:  Fair   Screenings: AIMS    Flowsheet Row Video Visit from 01/20/2022 in [place] Video Visit from 10/22/2021 in [place] Total Score 0 0      GAD-7    [place] Visit from 04/21/2022 in [place] Video Visit from 01/20/2022 in [place] Video Visit from 10/22/2021 in [place] Office Visit from 04/08/2021 in [place]  Total GAD-7 Score 0 1 0 [place] Visit from 01/01/2022 in [facility], [place] from 12/25/2020 in [facility], [place] from 12/19/2019 in [facility], [place] from 10/13/2017 in [facility]  Total Score (max 30 points ) '29 29 30 27      '$ PHQ2-9    [place] Visit from 04/21/2022 in [place] Video Visit from 01/20/2022 in [place] Video Visit from 10/22/2021 in [place] from 09/30/2021 in [place] Visit from 09/03/2021 in [facility]  PHQ-2 Total Score 0 0 0 2 0  PHQ-9 Total Score 0 -- -- 11 --      [place] Office Visit from 04/21/2022 in [place] Video Visit from 01/20/2022 in [place] Counselor from 12/09/2021 in [facility] No Risk No Risk No Risk        Assessment  and Plan: AISLINN FELIZ is a 37 year old Caucasian female with history of MDD, PTSD, insomnia, currently on SSD, works part-time was evaluated in office today.  Patient is currently doing well with regards to her mood on the current medication regimen, plan as noted below.  Plan  MDD in remission Wellbutrin 75 mg p.o. daily Trazodone 25 mg p.o. nightly Continue CBT with Ms. Christina Hussami  PTSD-stable Continue CBT  Insomnia-stable Trazodone 25-50 mg p.o. nightly  Collateral information from community coach - Inez Catalina as noted above.  Reviewed and discussed most recent labs, CMP, CBC, 04/13/2022-within normal limits.  Follow-up in clinic in 3 to 4 months or sooner if needed.   This note was generated in part or whole with voice recognition software. Voice recognition is usually quite accurate but there are transcription errors that can and very often do occur. I apologize for any typographical errors that were not detected and corrected.      Ursula Alert, MD 04/22/2022, 9:57 AM

## 2022-04-22 ENCOUNTER — Ambulatory Visit (INDEPENDENT_AMBULATORY_CARE_PROVIDER_SITE_OTHER): Payer: Medicare Other | Admitting: Licensed Clinical Social Worker

## 2022-04-22 DIAGNOSIS — Z634 Disappearance and death of family member: Secondary | ICD-10-CM

## 2022-04-22 DIAGNOSIS — F4310 Post-traumatic stress disorder, unspecified: Secondary | ICD-10-CM | POA: Diagnosis not present

## 2022-04-22 NOTE — Progress Notes (Signed)
Virtual Visit via Video Note  I connected with Teresa Crawford on 04/22/22 at  8:00 AM EST by a video enabled telemedicine application and verified that I am speaking with the correct person using two identifiers.  Video connection was lost when less than 50% of the duration of the visit was complete, at which time the remainder of the visit was completed via audio only.  Location: Patient: home Provider: remote office [name], [name]   I discussed the limitations of evaluation and management by telemedicine and the availability of in person appointments. The patient expressed understanding and agreed to proceed.  I discussed the assessment and treatment plan with the patient. The patient was provided an opportunity to ask questions and all were answered. The patient agreed with the plan and demonstrated an understanding of the instructions.   The patient was advised to call back or seek an in-person evaluation if the symptoms worsen or if the condition fails to improve as anticipated.  I provided 45 minutes of non-face-to-face time during this encounter.   Teresa Crawford R Christopherjohn Schiele, LCSW   THERAPIST PROGRESS NOTE  Session Time: 8:15-9 (pt was 15 min late for appt)  Participation Level: Active  Behavioral Response: NeatAlertAnxious and Depressed  Type of Therapy: Individual Therapy  ProgressTowards Goals: Progressing  Treatment Goals addressed: Develop healthy thinking patterns and beliefs about self, others, and the world that lead to the alleviation and help prevent the relapse of depression per self report 3 out of 5 sessions documented   Reduce the negative impact trauma related symptoms have on social, occupational, and family functioning per pt self report 3 out of 5 sessions documented.   Interventions:  CBT, trauma focused; grief counseling   Summary: Teresa Crawford is a 37 y.o. female who presents with continuing symptoms related to PTSD diagnosis. Pt reports that she  has not been feeling well physically, and this has impacted her overall mood.   Allowed pt to explore and express thoughts and feelings associated with recent life situations and external stressors. Pt reports that she is continuing to think about her friend, Teresa Crawford, that passed away. Allowed pt to explore relationship with her friend and identify feelings that have been triggered recently. Reviewed the grieving process and allowed pt to identify current stage of grief.   Explored pts current relationship with boyfriend. Pt states that she is truly happy and feels "he makes me a better person".  Continued recommendations are as follows: self care behaviors, positive social engagements, focusing on overall work/home/life balance, and focusing on positive physical and emotional wellness.   Suicidal/Homicidal: No  Therapist Response: Pt is continuing to apply interventions learned in session into daily life situations. Pt is currently on track to meet goals utilizing interventions mentioned above. Personal growth and progress noted. Treatment to continue as indicated.   Pt is improving with overall identification of anxiety triggers, managing anxiety, self exploration and self reflection. Pt identifies coping skills that are helpful when she is feeling sad/grieving.   Plan: Return again in 4 weeks.  Encounter Diagnoses  Name Primary?   Posttraumatic stress disorder Yes   Bereavement    Collaboration of Care: Other pt encouraged to continue care with psychiatrist of record, Dr. Shea Evans  Patient/Guardian was advised Release of Information must be obtained prior to any record release in order to collaborate their care with an outside provider. Patient/Guardian was advised if they have not already done so to contact the registration department to sign all necessary forms  in order for [place] to release information regarding their care.   Consent: Patient/Guardian gives verbal consent for treatment and  assignment of benefits for services provided during this visit. Patient/Guardian expressed understanding and agreed to proceed.    [place], LCSW 04/22/2022

## 2022-04-30 ENCOUNTER — Ambulatory Visit (INDEPENDENT_AMBULATORY_CARE_PROVIDER_SITE_OTHER): Payer: Medicare Other | Admitting: Nurse Practitioner

## 2022-04-30 ENCOUNTER — Encounter: Payer: Self-pay | Admitting: Nurse Practitioner

## 2022-04-30 VITALS — BP 140/76 | HR 89 | Temp 97.4°F | Resp 16 | Ht 63.0 in | Wt 191.0 lb

## 2022-04-30 DIAGNOSIS — J4520 Mild intermittent asthma, uncomplicated: Secondary | ICD-10-CM | POA: Diagnosis not present

## 2022-04-30 DIAGNOSIS — R1114 Bilious vomiting: Secondary | ICD-10-CM | POA: Diagnosis not present

## 2022-04-30 DIAGNOSIS — Z79899 Other long term (current) drug therapy: Secondary | ICD-10-CM | POA: Diagnosis not present

## 2022-04-30 DIAGNOSIS — R42 Dizziness and giddiness: Secondary | ICD-10-CM | POA: Diagnosis not present

## 2022-04-30 MED ORDER — TRAMADOL HCL 50 MG PO TABS: ORAL_TABLET | ORAL | 3 refills | Status: DC

## 2022-04-30 MED ORDER — ONDANSETRON 4 MG PO TBDP: 4.0000 mg | ORAL_TABLET | Freq: Three times a day (TID) | ORAL | 0 refills | Status: DC | PRN

## 2022-04-30 MED ORDER — ALBUTEROL SULFATE HFA 108 (90 BASE) MCG/ACT IN AERS: 2.0000 | INHALATION_SPRAY | Freq: Four times a day (QID) | RESPIRATORY_TRACT | 5 refills | Status: DC | PRN

## 2022-04-30 MED ORDER — FLUTICASONE PROPIONATE HFA 220 MCG/ACT IN AERO: INHALATION_SPRAY | RESPIRATORY_TRACT | 11 refills | Status: DC

## 2022-04-30 MED ORDER — DOCUSATE SODIUM 100 MG PO CAPS: ORAL_CAPSULE | ORAL | 1 refills | Status: DC

## 2022-04-30 MED ORDER — IBUPROFEN 600 MG PO TABS: ORAL_TABLET | ORAL | 5 refills | Status: DC

## 2022-04-30 MED ORDER — ACETAMINOPHEN 500 MG PO TABS: ORAL_TABLET | ORAL | 5 refills | Status: DC

## 2022-04-30 NOTE — Progress Notes (Signed)
[facility] [place], [place] [address]  Internal MEDICINE  Office Visit Note  Patient Name: Teresa Crawford  [id number]  [id number]  Date of Service: 04/30/2022  Chief Complaint  Patient presents with  . Follow-up  . Depression    HPI Teresa Crawford presents for a follow-up visit for depression   No specific concerns  Refills    Current Medication: Outpatient Encounter Medications as of 04/30/2022  Medication Sig  . buPROPion (WELLBUTRIN) 75 MG tablet Take 1 tablet (75 mg total) by mouth daily with breakfast.  . cetirizine (ZYRTEC ALLERGY) 10 MG tablet Take 1 tablet (10 mg total) by mouth daily.  Marland Kitchen EPINEPHrine 0.3 mg/0.3 mL IJ SOAJ injection USE FOR ANAPHYLAXIS  . fluticasone (FLONASE) 50 MCG/ACT nasal spray Place 1 spray into both nostrils daily.  . meclizine (ANTIVERT) 12.5 MG tablet One tab po tid prn for dizziness  . omeprazole (PRILOSEC) 40 MG capsule Take 1 capsule (40 mg total) by mouth 2 (two) times daily.  . tamsulosin (FLOMAX) 0.4 MG CAPS capsule   . topiramate (TOPAMAX) 50 MG tablet Take 1 tablet (50 mg total) by mouth every evening.  . traZODone (DESYREL) 50 MG tablet Take 0.5 tablets (25 mg total) by mouth at bedtime.  . triamcinolone cream (KENALOG) 0.1 % Apply 1 Application topically 2 (two) times daily as needed (itching and rash).  . [DISCONTINUED] acetaminophen (TYLENOL) 500 MG tablet 2 TABS PO BID PRN FOR HEADACHES AND PAINS  . [DISCONTINUED] albuterol (VENTOLIN HFA) 108 (90 Base) MCG/ACT inhaler Inhale 2 puffs into the lungs every 6 (six) hours as needed for wheezing or shortness of breath.  . [DISCONTINUED] docusate sodium (COLACE) 100 MG capsule TAKE 1 CAPSULE BY MOUTH ONCE DAILY FOR STOOL SOFTENER  . [DISCONTINUED] fluticasone (FLOVENT HFA) 220 MCG/ACT inhaler INHALE 2 PUFFS INTO THE LUNGS, 2 TIMES PER DAY. RINSE MOUTH AFTER INHALATIONS  . [DISCONTINUED] ibuprofen (ADVIL) 600 MG tablet ONE TAB PO BID PRN FOR PAIN AND HEADACHES  .  [DISCONTINUED] ondansetron (ZOFRAN-ODT) 4 MG disintegrating tablet Take 1 tablet (4 mg total) by mouth every 8 (eight) hours as needed for nausea or vomiting.  . [DISCONTINUED] traMADol (ULTRAM) 50 MG tablet ONE TAB PO BID PRN FOR PAIN AND HEADCAHES  . acetaminophen (TYLENOL) 500 MG tablet 2 TABS PO BID PRN FOR HEADACHES AND PAINS  . albuterol (VENTOLIN HFA) 108 (90 Base) MCG/ACT inhaler Inhale 2 puffs into the lungs every 6 (six) hours as needed for wheezing or shortness of breath.  . docusate sodium (COLACE) 100 MG capsule TAKE 1 CAPSULE BY MOUTH ONCE DAILY FOR STOOL SOFTENER  . fluticasone (FLOVENT HFA) 220 MCG/ACT inhaler INHALE 2 PUFFS INTO THE LUNGS, 2 TIMES PER DAY. RINSE MOUTH AFTER INHALATIONS  . ibuprofen (ADVIL) 600 MG tablet ONE TAB PO BID PRN FOR PAIN AND HEADACHES  . ondansetron (ZOFRAN-ODT) 4 MG disintegrating tablet Take 1 tablet (4 mg total) by mouth every 8 (eight) hours as needed for nausea or vomiting.  . traMADol (ULTRAM) 50 MG tablet ONE TAB PO BID PRN FOR PAIN AND HEADCAHES   No facility-administered encounter medications on file as of 04/30/2022.    Surgical History: Past Surgical History:  Procedure Laterality Date  . ABDOMINAL HYSTERECTOMY    . BACK SURGERY    . COLONOSCOPY  04/27/2006   Wohl-colitis, internal hemorrhoids  . COLONOSCOPY  02/2013   focal right colitis, left colon biopsy negative, focal active proctitis without chronicity  . ESOPHAGOGASTRODUODENOSCOPY  02/2013   multiple  peptic & duodenal ulcers, negative h pylori  . INTRAUTERINE DEVICE INSERTION  2012  . LAPAROSCOPIC ENDOMETRIOSIS FULGURATION  2015   [name]  . OVARIAN CYST REMOVAL      Medical History: Past Medical History:  Diagnosis Date  . B12 deficiency anemia   . Chronic back pain   . Chronic fatigue syndrome   . Constipation   . Depression   . Elevated liver enzymes   . Endometriosis   . Fibroids   . Hepatic steatosis   . Hypothyroid   . Last menstrual period (LMP) > 10 days ago  09/12/15  . Mental retardation   . Migraine   . Post traumatic stress disorder   . Posttraumatic stress disorder   . PUD (peptic ulcer disease)   . Scoliosis   . Splenomegaly   . Uterine fibroid     Family History: Family History  Problem Relation Age of Onset  . Breast cancer Mother 32  . COPD Father   . Lung cancer Father 25  . Dementia Father   . Diabetes Sister   . Diabetes Brother   . Ovarian cancer Paternal Aunt   . Colon cancer Maternal Grandfather 61  . Liver disease Maternal Aunt        ?etiology  . Lymphoma Maternal Grandmother 60  . Lung cancer Other 37       maternal great grandfather  . Stomach cancer Other 70       maternal great grandmother    Social History   Socioeconomic History  . Marital status: Single    Spouse name: Not on file  . Number of children: 0  . Years of education: Not on file  . Highest education level: Not on file  Occupational History  . Not on file  Tobacco Use  . Smoking status: Never  . Smokeless tobacco: Never  Vaping Use  . Vaping Use: Never used  Substance and Sexual Activity  . Alcohol use: Not Currently    Alcohol/week: 0.0 standard drinks of alcohol  . Drug use: No  . Sexual activity: Not Currently  Other Topics Concern  . Not on file  Social History Narrative   She has brother, half brother & sister live nearby   Both parents deceased   Care managed by [name]   Social Determinants of Health   Financial Resource Strain: Low Risk  (02/11/2017)   Overall Financial Resource Strain (CARDIA)   . Difficulty of Paying Living Expenses: Not hard at all  Food Insecurity: No Food Insecurity (02/11/2017)   Hunger Vital Sign   . Worried About Charity fundraiser in the Last Year: Never true   . Ran Out of Food in the Last Year: Never true  Transportation Needs: No Transportation Needs (02/11/2017)   PRAPARE - Transportation   . Lack of Transportation (Medical): No   . Lack of Transportation (Non-Medical): No   Physical Activity: Inactive (02/11/2017)   Exercise Vital Sign   . Days of Exercise per Week: 0 days   . Minutes of Exercise per Session: 0 min  Stress: No Stress Concern Present (02/11/2017)   [place]   . Feeling of Stress : Not at all  Social Connections: Unknown (02/11/2017)   Social Connection and Isolation Panel [NHANES]   . Frequency of Communication with Friends and Family: Patient refused   . Frequency of Social Gatherings with Friends and Family: Patient refused   . Attends Religious Services:  Patient refused   . Active Member of Clubs or Organizations: Patient refused   . Attends Archivist Meetings: Patient refused   . Marital Status: Patient refused  Intimate Partner Violence: Unknown (02/11/2017)   Humiliation, Afraid, Rape, and Kick questionnaire   . Fear of Current or Ex-Partner: Patient refused   . Emotionally Abused: Patient refused   . Physically Abused: Patient refused   . Sexually Abused: Patient refused      Review of Systems  Constitutional:  Negative for activity change, appetite change, chills, fatigue, fever and unexpected weight change.  HENT: Negative.  Negative for congestion, ear pain, rhinorrhea, sore throat and trouble swallowing.   Eyes: Negative.   Respiratory: Negative.  Negative for cough, chest tightness, shortness of breath and wheezing.   Cardiovascular: Negative.  Negative for chest pain.  Gastrointestinal: Negative.  Negative for abdominal pain, blood in stool, constipation, diarrhea, nausea and vomiting.  Endocrine: Negative.   Genitourinary: Negative.  Negative for difficulty urinating, dysuria, frequency, hematuria and urgency.  Musculoskeletal: Negative.  Negative for arthralgias, back pain, joint swelling, myalgias and neck pain.  Skin: Negative.  Negative for rash and wound.  Allergic/Immunologic: Negative.  Negative for immunocompromised state.   Neurological: Negative.  Negative for dizziness, seizures, numbness and headaches.  Hematological: Negative.   Psychiatric/Behavioral:  Negative for behavioral problems, self-injury and suicidal ideas. The patient is not nervous/anxious.     Vital Signs: BP (!) 140/76   Pulse 89   Temp (!) 97.4 F (36.3 C)   Resp 16   Ht '5\' 3"'$  (1.6 m)   Wt 191 lb (86.6 kg)   LMP 12/06/2015   SpO2 98%   BMI 33.83 kg/m    Physical Exam Vitals reviewed.  Constitutional:      General: She is not in acute distress.    Appearance: Normal appearance. She is obese. She is not ill-appearing.  HENT:     Head: Normocephalic and atraumatic.  Eyes:     Pupils: Pupils are equal, round, and reactive to light.  Cardiovascular:     Rate and Rhythm: Normal rate and regular rhythm.  Pulmonary:     Effort: Pulmonary effort is normal. No respiratory distress.  Neurological:     Mental Status: She is alert.  Psychiatric:        Mood and Affect: Mood normal.        Behavior: Behavior normal.       Assessment/Plan: 1. Mild intermittent asthma without complication *** - fluticasone (FLOVENT HFA) 220 MCG/ACT inhaler; INHALE 2 PUFFS INTO THE LUNGS, 2 TIMES PER DAY. RINSE MOUTH AFTER INHALATIONS  Dispense: 12 g; Refill: 11  2. Bilious vomiting with nausea *** - ondansetron (ZOFRAN-ODT) 4 MG disintegrating tablet; Take 1 tablet (4 mg total) by mouth every 8 (eight) hours as needed for nausea or vomiting.  Dispense: 20 tablet; Refill: 0  3. Encounter for medication review *** - traMADol (ULTRAM) 50 MG tablet; ONE TAB PO BID PRN FOR PAIN AND HEADCAHES  Dispense: 20 tablet; Refill: 3 - ondansetron (ZOFRAN-ODT) 4 MG disintegrating tablet; Take 1 tablet (4 mg total) by mouth every 8 (eight) hours as needed for nausea or vomiting.  Dispense: 20 tablet; Refill: 0 - ibuprofen (ADVIL) 600 MG tablet; ONE TAB PO BID PRN FOR PAIN AND HEADACHES  Dispense: 30 tablet; Refill: 5 - acetaminophen (TYLENOL) 500 MG tablet;  2 TABS PO BID PRN FOR HEADACHES AND PAINS  Dispense: 45 tablet; Refill: 5 - albuterol (VENTOLIN HFA) 108 (90  Base) MCG/ACT inhaler; Inhale 2 puffs into the lungs every 6 (six) hours as needed for wheezing or shortness of breath.  Dispense: 8 g; Refill: 5 - docusate sodium (COLACE) 100 MG capsule; TAKE 1 CAPSULE BY MOUTH ONCE DAILY FOR STOOL SOFTENER  Dispense: 90 capsule; Refill: 1   General Counseling: Erlene verbalizes understanding of the findings of todays visit and agrees with plan of treatment. I have discussed any further diagnostic evaluation that may be needed or ordered today. We also reviewed her medications today. she has been encouraged to call the office with any questions or concerns that should arise related to todays visit.    No orders of the defined types were placed in this encounter.   Meds ordered this encounter  Medications  . traMADol (ULTRAM) 50 MG tablet    Sig: ONE TAB PO BID PRN FOR PAIN AND HEADCAHES    Dispense:  20 tablet    Refill:  3  . ondansetron (ZOFRAN-ODT) 4 MG disintegrating tablet    Sig: Take 1 tablet (4 mg total) by mouth every 8 (eight) hours as needed for nausea or vomiting.    Dispense:  20 tablet    Refill:  0    Please fill script now  . ibuprofen (ADVIL) 600 MG tablet    Sig: ONE TAB PO BID PRN FOR PAIN AND HEADACHES    Dispense:  30 tablet    Refill:  5  . acetaminophen (TYLENOL) 500 MG tablet    Sig: 2 TABS PO BID PRN FOR HEADACHES AND PAINS    Dispense:  45 tablet    Refill:  5  . albuterol (VENTOLIN HFA) 108 (90 Base) MCG/ACT inhaler    Sig: Inhale 2 puffs into the lungs every 6 (six) hours as needed for wheezing or shortness of breath.    Dispense:  8 g    Refill:  5  . docusate sodium (COLACE) 100 MG capsule    Sig: TAKE 1 CAPSULE BY MOUTH ONCE DAILY FOR STOOL SOFTENER    Dispense:  90 capsule    Refill:  1    FOR FUTURE USE THANKS  . fluticasone (FLOVENT HFA) 220 MCG/ACT inhaler    Sig: INHALE 2 PUFFS INTO THE LUNGS, 2  TIMES PER DAY. RINSE MOUTH AFTER INHALATIONS    Dispense:  12 g    Refill:  11    Return in about 4 months (around 08/29/2022) for F/U, Chinmay Squier PCP.   Total time spent:30 Minutes Time spent includes review of chart, medications, test results, and follow up plan with the patient.   [place] Controlled Substance Database was reviewed by me.  This patient was seen by Jonetta Osgood, FNP-C in collaboration with Dr. Clayborn Bigness as a part of collaborative care agreement.   Kanen Mottola R. Valetta Fuller, MSN, FNP-C Internal medicine

## 2022-05-02 ENCOUNTER — Encounter: Payer: Self-pay | Admitting: Nurse Practitioner

## 2022-05-13 ENCOUNTER — Telehealth (INDEPENDENT_AMBULATORY_CARE_PROVIDER_SITE_OTHER): Payer: Medicare Other | Admitting: Nurse Practitioner

## 2022-05-13 ENCOUNTER — Encounter: Payer: Self-pay | Admitting: Nurse Practitioner

## 2022-05-13 VITALS — Resp 16 | Ht 63.0 in | Wt 180.0 lb

## 2022-05-13 DIAGNOSIS — R1030 Lower abdominal pain, unspecified: Secondary | ICD-10-CM

## 2022-05-13 DIAGNOSIS — R1114 Bilious vomiting: Secondary | ICD-10-CM | POA: Diagnosis not present

## 2022-05-13 DIAGNOSIS — K921 Melena: Secondary | ICD-10-CM | POA: Diagnosis not present

## 2022-05-13 NOTE — Progress Notes (Signed)
[facility] [place], [place] [address]  Internal MEDICINE  Telephone Visit  Patient Name: Teresa Crawford  [id number]  [id number]  Date of Service: 05/13/2022  I connected with the patient at 1650 by telephone and verified the patients identity using two identifiers.   I discussed the limitations, risks, security and privacy concerns of performing an evaluation and management service by telephone and the availability of in person appointments. I also discussed with the patient that there may be a patient responsible charge related to the service.  The patient expressed understanding and agrees to proceed.    Chief Complaint  Patient presents with   Telephone Screen    diarrhea  and nausea, blood in stool, very tired. [id number] video.   Telephone Assessment    HPI Teresa Crawford presents for a telehealth virtual visit for nausea, diarrhea, and blood in stool.  Onset - noticed it this morning.  Also having fatigue   Current Medication: Outpatient Encounter Medications as of 05/13/2022  Medication Sig   acetaminophen (TYLENOL) 500 MG tablet 2 TABS PO BID PRN FOR HEADACHES AND PAINS   albuterol (VENTOLIN HFA) 108 (90 Base) MCG/ACT inhaler Inhale 2 puffs into the lungs every 6 (six) hours as needed for wheezing or shortness of breath.   buPROPion (WELLBUTRIN) 75 MG tablet Take 1 tablet (75 mg total) by mouth daily with breakfast.   cetirizine (ZYRTEC ALLERGY) 10 MG tablet Take 1 tablet (10 mg total) by mouth daily.   docusate sodium (COLACE) 100 MG capsule TAKE 1 CAPSULE BY MOUTH ONCE DAILY FOR STOOL SOFTENER   EPINEPHrine 0.3 mg/0.3 mL IJ SOAJ injection USE FOR ANAPHYLAXIS   fluticasone (FLONASE) 50 MCG/ACT nasal spray Place 1 spray into both nostrils daily.   fluticasone (FLOVENT HFA) 220 MCG/ACT inhaler INHALE 2 PUFFS INTO THE LUNGS, 2 TIMES PER DAY. RINSE MOUTH AFTER INHALATIONS   ibuprofen (ADVIL) 600 MG tablet ONE TAB PO BID PRN FOR PAIN AND HEADACHES    meclizine (ANTIVERT) 12.5 MG tablet One tab po tid prn for dizziness   omeprazole (PRILOSEC) 40 MG capsule Take 1 capsule (40 mg total) by mouth 2 (two) times daily.   ondansetron (ZOFRAN-ODT) 4 MG disintegrating tablet Take 1 tablet (4 mg total) by mouth every 8 (eight) hours as needed for nausea or vomiting.   tamsulosin (FLOMAX) 0.4 MG CAPS capsule    topiramate (TOPAMAX) 50 MG tablet Take 1 tablet (50 mg total) by mouth every evening.   traMADol (ULTRAM) 50 MG tablet ONE TAB PO BID PRN FOR PAIN AND HEADCAHES   traZODone (DESYREL) 50 MG tablet Take 0.5 tablets (25 mg total) by mouth at bedtime.   triamcinolone cream (KENALOG) 0.1 % Apply 1 Application topically 2 (two) times daily as needed (itching and rash).   No facility-administered encounter medications on file as of 05/13/2022.    Surgical History: Past Surgical History:  Procedure Laterality Date   ABDOMINAL HYSTERECTOMY     BACK SURGERY     COLONOSCOPY  04/27/2006   Wohl-colitis, internal hemorrhoids   COLONOSCOPY  02/2013   focal right colitis, left colon biopsy negative, focal active proctitis without chronicity   ESOPHAGOGASTRODUODENOSCOPY  02/2013   multiple peptic & duodenal ulcers, negative h pylori   INTRAUTERINE DEVICE INSERTION  2012   LAPAROSCOPIC ENDOMETRIOSIS FULGURATION  2015   [name]   OVARIAN CYST REMOVAL      Medical History: Past Medical History:  Diagnosis Date   B12 deficiency anemia    Chronic  back pain    Chronic fatigue syndrome    Constipation    Depression    Elevated liver enzymes    Endometriosis    Fibroids    Hepatic steatosis    Hypothyroid    Last menstrual period (LMP) > 10 days ago 09/12/15   Mental retardation    Migraine    Post traumatic stress disorder    Posttraumatic stress disorder    PUD (peptic ulcer disease)    Scoliosis    Splenomegaly    Uterine fibroid     Family History: Family History  Problem Relation Age of Onset   Breast cancer Mother 53   COPD Father     Lung cancer Father 5   Dementia Father    Diabetes Sister    Diabetes Brother    Ovarian cancer Paternal Aunt    Colon cancer Maternal Grandfather 89   Liver disease Maternal Aunt        ?etiology   Lymphoma Maternal Grandmother 48   Lung cancer Other 18       maternal great grandfather   Stomach cancer Other 57       maternal great grandmother    Social History   Socioeconomic History   Marital status: Single    Spouse name: Not on file   Number of children: 0   Years of education: Not on file   Highest education level: Not on file  Occupational History   Not on file  Tobacco Use   Smoking status: Never   Smokeless tobacco: Never  Vaping Use   Vaping Use: Never used  Substance and Sexual Activity   Alcohol use: Not Currently    Alcohol/week: 0.0 standard drinks of alcohol   Drug use: No   Sexual activity: Not Currently  Other Topics Concern   Not on file  Social History Narrative   She has brother, half brother & sister live nearby   Both parents deceased   Care managed by [name]   Social Determinants of Health   Financial Resource Strain: Low Risk  (02/11/2017)   Overall Financial Resource Strain (CARDIA)    Difficulty of Paying Living Expenses: Not hard at all  Food Insecurity: No Food Insecurity (02/11/2017)   Hunger Vital Sign    Worried About Running Out of Food in the Last Year: Never true    [place] in the Last Year: Never true  Transportation Needs: No Transportation Needs (02/11/2017)   PRAPARE - Hydrologist (Medical): No    Lack of Transportation (Non-Medical): No  Physical Activity: Inactive (02/11/2017)   Exercise Vital Sign    Days of Exercise per Week: 0 days    Minutes of Exercise per Session: 0 min  Stress: No Stress Concern Present (02/11/2017)   [place]    Feeling of Stress : Not at all  Social Connections: Unknown (02/11/2017)    Social Connection and Isolation Panel [NHANES]    Frequency of Communication with Friends and Family: Patient refused    Frequency of Social Gatherings with Friends and Family: Patient refused    Attends Religious Services: Patient refused    Active Member of Clubs or Organizations: Patient refused    Attends Archivist Meetings: Patient refused    Marital Status: Patient refused  Intimate Partner Violence: Unknown (02/11/2017)   Humiliation, Afraid, Rape, and Kick questionnaire    Fear of Current or  Ex-Partner: Patient refused    Emotionally Abused: Patient refused    Physically Abused: Patient refused    Sexually Abused: Patient refused      Review of Systems  Cardiovascular: Negative.  Negative for chest pain and palpitations.  Gastrointestinal:  Positive for abdominal pain, blood in stool, diarrhea, nausea, rectal pain and vomiting.    Vital Signs: Resp 16   Ht 5' 3"$  (1.6 m)   Wt 180 lb (81.6 kg)   LMP 12/06/2015   BMI 31.89 kg/m    Observation/Objective: She is alert and oriented and engages in conversation appropriately. No acute distress noted. She also showed a picture of the bloody stool in the toilet which was a significant amount.     Assessment/Plan: 1. Bilious vomiting with nausea Instructed patient to go to the ER for a significant amount of bloody stools along with nausea vomiting and abdominal pain - Ambulatory referral to Gastroenterology  2. Bloody stool See problem #1 - Ambulatory referral to Gastroenterology  3. Lower abdominal pain See problem #1 - Ambulatory referral to Gastroenterology   General Counseling: Teresa Crawford understanding of the findings of today's phone visit and agrees with plan of treatment. I have discussed any further diagnostic evaluation that may be needed or ordered today. We also reviewed her medications today. she has been encouraged to call the office with any questions or concerns that should arise related  to todays visit.  Return if symptoms worsen or fail to improve, for instructed to go to nearest ER for significant amount of bloody stools.   Orders Placed This Encounter  Procedures   Ambulatory referral to Gastroenterology    No orders of the defined types were placed in this encounter.   Time spent:10 Minutes Time spent with patient included reviewing progress notes, labs, imaging studies, and discussing plan for follow up.  [place] Controlled Substance Database was reviewed by me for overdose risk score (ORS) if appropriate.  This patient was seen by Jonetta Osgood, FNP-C in collaboration with Dr. Clayborn Bigness as a part of collaborative care agreement.  Kyana Aicher R. Valetta Fuller, MSN, FNP-C Internal medicine

## 2022-05-15 ENCOUNTER — Emergency Department
Admission: EM | Admit: 2022-05-15 | Discharge: 2022-05-15 | Disposition: A | Discharge status: Home / Self Care | Payer: Medicare Other | Attending: Emergency Medicine | Admitting: Emergency Medicine

## 2022-05-15 DIAGNOSIS — K625 Hemorrhage of anus and rectum: Secondary | ICD-10-CM

## 2022-05-15 LAB — COMPREHENSIVE METABOLIC PANEL
ALT: 22 U/L (ref 0–44)
AST: 17 U/L (ref 15–41)
Albumin: 3.9 g/dL (ref 3.5–5.0)
Alkaline Phosphatase: 86 U/L (ref 38–126)
Anion gap: 10 (ref 5–15)
BUN: 8 mg/dL (ref 6–20)
CO2: 23 mmol/L (ref 22–32)
Calcium: 9 mg/dL (ref 8.9–10.3)
Chloride: 101 mmol/L (ref 98–111)
Creatinine, Ser: 0.62 mg/dL (ref 0.44–1.00)
GFR, Estimated: >60 mL/min (ref >60)
Glucose, Bld: 99 mg/dL (ref 70–99)
Potassium: 4.1 mmol/L (ref 3.5–5.1)
Sodium: 134 mmol/L — ABNORMAL LOW (ref 135–145)
Total Bilirubin: 0.6 mg/dL (ref 0.3–1.2)
Total Protein: 7.8 g/dL (ref 6.5–8.1)

## 2022-05-15 LAB — LIPASE, BLOOD: Lipase: 37 U/L (ref 11–51)

## 2022-05-15 LAB — CBC
HCT: 40.7 % (ref 36.0–46.0)
Hemoglobin: 13.2 g/dL (ref 12.0–15.0)
MCH: 27.3 pg (ref 26.0–34.0)
MCHC: 32.4 g/dL (ref 30.0–36.0)
MCV: 84.1 fL (ref 80.0–100.0)
Platelets: 190 10*3/uL (ref 150–400)
RBC: 4.84 MIL/uL (ref 3.87–5.11)
RDW: 13.5 % (ref 11.5–15.5)
WBC: 4.5 10*3/uL (ref 4.0–10.5)
nRBC: 0 % (ref 0.0–0.2)

## 2022-05-15 LAB — TYPE AND SCREEN

## 2022-05-15 MED ORDER — ONDANSETRON 4 MG PO TBDP
4.0000 mg | ORAL_TABLET | Freq: Once | ORAL | Status: AC
  Administered 2022-05-15: 4 mg via ORAL
  Filled 2022-05-15: qty 1

## 2022-05-15 NOTE — ED Notes (Signed)
The pt lives at an independent [name] apartment. The staff contact person that I called to give report was Learta Codding at [phone]. Teresa Crawford is the staff member who will transport Teresa Crawford back to her apartment.

## 2022-05-15 NOTE — ED Notes (Signed)
See triage note. Pt A&Ox4. Pt came in for rectal bleeding for 2 days. Pt stools were originally red and bloody in color and now are black. Hx of hemorrhoids.

## 2022-05-15 NOTE — ED Provider Notes (Signed)
[facility] Provider Note    Event Date/Time   First MD Initiated Contact with Patient 05/15/22 1246     (approximate)   History   Rectal Bleeding   HPI  Teresa Crawford is a 37 y.o. female   Past medical history of cognitive delay, PTSD, hemorrhoids, prior hysterectomy who presents to the emergency department with bright red blood per rectum starting yesterday.  She has had some loose stool/diarrhea and associated blood in the toilet and on the toilet paper after she makes a bowel movement.  The remainder of the stool has been dark brown and liquid.  She has had some abdominal cramping.  Some nausea and vomiting.  No fever or chills.  No urinary symptoms.  No vaginal bleeding or discharge.  No other acute medical complaints.   External Medical Documents Reviewed: Gastroenterology office visit dated May 2015 for rectal bleeding ongoing and proctitis/colitis.      Physical Exam   Triage Vital Signs: ED Triage Vitals  Enc Vitals Group     BP 05/15/22 1225 133/84     Pulse Rate 05/15/22 1225 67     Resp 05/15/22 1225 16     Temp 05/15/22 1225 98 F (36.7 C)     Temp Source 05/15/22 1225 Oral     SpO2 05/15/22 1225 98 %     Weight 05/15/22 1226 179 lb 14.3 oz (81.6 kg)     Height 05/15/22 1226 5' 3"$  (1.6 m)     Head Circumference --      Peak Flow --      Pain Score 05/15/22 1226 3     Pain Loc --      Pain Edu? --      Excl. in [place]? --     Most recent vital signs: Vitals:   05/15/22 1225  BP: 133/84  Pulse: 67  Resp: 16  Temp: 98 F (36.7 C)  SpO2: 98%    General: Awake, no distress.  CV:  Good peripheral perfusion.  Resp:  Normal effort.  Abd:  No distention.  Other:  Abdomen soft and nontender, rectal exam with empty rectal vault no obvious external hemorrhoids or lesions, no blood or melena, appears comfortable euvolemic with normal vital signs skin appears warm well-perfused.   ED Results / Procedures / Treatments    Labs (all labs ordered are listed, but only abnormal results are displayed) Labs Reviewed  COMPREHENSIVE METABOLIC PANEL - Abnormal; Notable for the following components:      Result Value   Sodium 134 (*)    All other components within normal limits  CBC  LIPASE, BLOOD  TYPE AND SCREEN  TYPE AND SCREEN     I ordered and reviewed the above labs they are notable for hemoglobin 13 at baseline    PROCEDURES:  Critical Care performed: No  Procedures   MEDICATIONS ORDERED IN ED: Medications  ondansetron (ZOFRAN-ODT) disintegrating tablet 4 mg (4 mg Oral Given 05/15/22 1332)     IMPRESSION / MDM / ASSESSMENT AND PLAN / ED COURSE  I reviewed the triage vital signs and the nursing notes.                                Patient's presentation is most consistent with acute presentation with potential threat to life or bodily function.  Differential diagnosis includes, but is not limited to, acute blood loss anemia, GI bleeding,  hemorrhoids, gastroenteritis/colitis, dehydration electrolyte derangement, intra-abdominal infection or obstruction   The patient is on the cardiac monitor to evaluate for evidence of arrhythmia and/or significant heart rate changes.  MDM: Overall well-appearing patient with history of hemorrhoids and some rectal bleeding she showed me pictures and it looks scant mixed with diarrhea.  Abdomen is soft and nontender I doubt surgical abdominal pathology.  No active bleeding on my exam.  History of the same rectal bleeding seen by GI years ago, I will check basic labs for anemia requiring transfusion, and if labs are normal and patient continues to look well without further episodes of bleeding I think she can follow-up as an outpatient with GI.  I doubt significant blood loss if vital signs are normal and labs within normal limits, and with a benign abdominal exam I doubt any surgical pathology.  I considered hospitalization for admission or observation but  given no further bleeding and hemodynamics reassuring, no signs of active bleeding on my exam, and vital signs stable either she can be followed up as an outpatient and referral to GI given.  Return precautions given.        FINAL CLINICAL IMPRESSION(S) / ED DIAGNOSES   Final diagnoses:  Rectal bleeding     Rx / DC Orders   ED Discharge Orders          Ordered    Ambulatory referral to Gastroenterology        05/15/22 1346             Note:  This document was prepared using Dragon voice recognition software and may include unintentional dictation errors.    Lucillie Garfinkel, MD 05/15/22 [phone]

## 2022-05-15 NOTE — Discharge Instructions (Signed)
Thank you for choosing [place] for your health care today!  Please see your primary doctor this week for a follow up appointment.   Sometimes, in the early stages of certain disease courses it is difficult to detect in the emergency department evaluation -- so, it is important that you continue to monitor your symptoms and call your doctor right away or return to the emergency department if you develop any new or worsening symptoms.  Please go to the following website to schedule new (and existing) patient appointments:   [email or web address]  If you do not have a primary doctor try calling the following clinics to establish care:  If you have insurance:  [facility] [phone] [place] [address]   [facility]  [phone] [place] [place] [address]   If you do not have insurance:  [facility]  [phone] [address], [place] [address]   The following is another list of primary care offices in the area who are accepting new patients at this time.  Please reach out to one of them directly and let them know you would like to schedule an appointment to follow up on an Emergency Department visit, and/or to establish a new primary care provider (PCP).  There are likely other primary care clinics in the are who are accepting new patients, but this is an excellent place to start:  [place] physician: Dr Lavon Paganini [address] [place], [place] [address] [phone]  [facility] Lead Physician: Dr Steele Sizer [address], [place], [place] [address] [phone]  [place] Physician: Dr Park Liter [address] [place], [place] [address] [phone]  [facility] Lead Physician: Dr Dewaine Oats [place], [place], [place] [address] [phone]  [facility] Physician: Dr Halina Maidens [address] [place], [place] [address] [phone]   It was my pleasure to care for you today.   [place] Brunette Jacelyn Grip, MD

## 2022-05-15 NOTE — ED Triage Notes (Signed)
Pt presents to the ED via POV due to bloody stools x 2 days. Pt states it started off has diarrhea and then became bloody diarrhea. Pt  states stools are dark red this morning. Pt was seen at [facility] for same complaint 2 days. Pt states she does have a hx of hemorrhoids. Pt A&Ox4

## 2022-05-17 ENCOUNTER — Encounter: Payer: Self-pay | Admitting: Nurse Practitioner

## 2022-06-02 ENCOUNTER — Ambulatory Visit (INDEPENDENT_AMBULATORY_CARE_PROVIDER_SITE_OTHER): Payer: Medicare Other | Admitting: Licensed Clinical Social Worker

## 2022-06-02 DIAGNOSIS — F4310 Post-traumatic stress disorder, unspecified: Secondary | ICD-10-CM | POA: Diagnosis not present

## 2022-06-02 NOTE — Progress Notes (Signed)
Virtual Visit via Video Note  I connected with Teresa Crawford on 06/02/22 at  2:00 PM EST by a video enabled telemedicine application and verified that I am speaking with the correct person using two identifiers.  Video connection was lost when less than 50% of the duration of the visit was complete, at which time the remainder of the visit was completed via audio only.  Location: Patient: home Provider: remote office [place], [name]   I discussed the limitations of evaluation and management by telemedicine and the availability of in person appointments. The patient expressed understanding and agreed to proceed.  I discussed the assessment and treatment plan with the patient. The patient was provided an opportunity to ask questions and all were answered. The patient agreed with the plan and demonstrated an understanding of the instructions.   The patient was advised to call back or seek an in-person evaluation if the symptoms worsen or if the condition fails to improve as anticipated.  I provided 45 minutes of non-face-to-face time during this encounter.   Teresa Funez R Gottlieb Zuercher, LCSW   THERAPIST PROGRESS NOTE  Session Time: 8:15-9 (pt was 15 min late for appt)  Participation Level: Active  Behavioral Response: NeatAlertAnxious and Depressed  Type of Therapy: Individual Therapy  ProgressTowards Goals: Progressing  Treatment Goals addressed: Develop healthy thinking patterns and beliefs about self, others, and the world that lead to the alleviation and help prevent the relapse of depression per self report 3 out of 5 sessions documented   Reduce the negative impact trauma related symptoms have on social, occupational, and family functioning per pt self report 3 out of 5 sessions documented.   Interventions:  CBT, trauma focused; grief counseling   Summary: Teresa Crawford is a 37 y.o. female who presents with continuing symptoms related to PTSD diagnosis. Pt reports that she  has not been feeling well physically, and this has impacted her overall mood.   Allowed pt to explore and express thoughts and feelings as associated with recent life situations and external stressors.   --feel that I am still communicating with him telepathically  --keep talking about Will  --we wanted to facetime each other. Didn't want to facetime him in the moment. Pt refused to facetime at the moment. Refused to take the call--  --he said leave me alone and didn't want her to talk to him anymore. He ended up blocking her  --identifying feelings--angry initially  --focus on self care/recovery. Encouraged positive supportive social engagement (best friends).   --"its all meant to be whatever happens"  --focusing on task list, self care  --pt discussed getting engaged--pt boyfriend states he wanted that too  Continued recommendations are as follows: self care behaviors, positive social engagements, focusing on overall work/home/life balance, and focusing on positive physical and emotional wellness.   Suicidal/Homicidal: No  Therapist Response: Pt is continuing to apply interventions learned in session into daily life situations. Pt is currently on track to meet goals utilizing interventions mentioned above. Personal growth and progress noted. Treatment to continue as indicated.   Pt is improving with overall identification of anxiety triggers, managing anxiety, self exploration and self reflection. Pt identifies coping skills that are helpful when she is feeling sad/grieving.   Plan: Return again in 4 weeks.  No diagnosis found.  Collaboration of Care: Other pt encouraged to continue care with psychiatrist of record, Dr. Shea Evans  Patient/Guardian was advised Release of Information must be obtained prior to any record release in order to  collaborate their care with an outside provider. Patient/Guardian was advised if they have not already done so to contact the registration  department to sign all necessary forms in order for [place] to release information regarding their care.   Consent: Patient/Guardian gives verbal consent for treatment and assignment of benefits for services provided during this visit. Patient/Guardian expressed understanding and agreed to proceed.    [place], LCSW 06/02/2022

## 2022-06-03 ENCOUNTER — Other Ambulatory Visit: Payer: Self-pay

## 2022-06-08 ENCOUNTER — Encounter: Payer: Self-pay | Admitting: Gastroenterology

## 2022-06-08 ENCOUNTER — Ambulatory Visit (INDEPENDENT_AMBULATORY_CARE_PROVIDER_SITE_OTHER): Payer: Medicare Other | Admitting: Gastroenterology

## 2022-06-08 ENCOUNTER — Ambulatory Visit
Admission: RE | Admit: 2022-06-08 | Discharge: 2022-06-08 | Disposition: A | Discharge status: Home / Self Care | Payer: Medicare Other | Source: Ambulatory Visit | Attending: Gastroenterology | Admitting: Gastroenterology

## 2022-06-08 VITALS — BP 121/76 | HR 82 | Temp 98.3°F | Ht 63.0 in | Wt 191.2 lb

## 2022-06-08 DIAGNOSIS — R1032 Left lower quadrant pain: Secondary | ICD-10-CM

## 2022-06-08 DIAGNOSIS — R197 Diarrhea, unspecified: Secondary | ICD-10-CM | POA: Diagnosis not present

## 2022-06-08 DIAGNOSIS — K219 Gastro-esophageal reflux disease without esophagitis: Secondary | ICD-10-CM

## 2022-06-08 DIAGNOSIS — R109 Unspecified abdominal pain: Secondary | ICD-10-CM | POA: Diagnosis not present

## 2022-06-08 MED ORDER — IOHEXOL 300 MG/ML  SOLN
100.0000 mL | Freq: Once | INTRAMUSCULAR | Status: AC | PRN
  Administered 2022-06-08: 100 mL via INTRAVENOUS

## 2022-06-08 NOTE — Patient Instructions (Addendum)
CT abdominal and pelvis [facility] is [facility].

## 2022-06-08 NOTE — Progress Notes (Unsigned)
Cephas Darby, MD [address]  [place]  [place], [place] [address]  Main: [phone]  Fax: [phone]    Gastroenterology Consultation  Referring Provider:     Jonetta Osgood, NP Primary Care Physician:  Jonetta Osgood, NP Primary Gastroenterologist:  Dr. Cephas Darby Reason for Consultation: Left lower quadrant pain, bloody diarrhea        HPI:   Teresa Crawford is a 37 y.o. female referred by Jonetta Osgood, NP  for consultation & management of left lower quadrant pain and diarrhea.  Patient went to ER on 05/15/2022 with abdominal pain, predominantly in the left lower quadrant area associated with loose bowel movements mixed with blood, nausea and vomiting.  She was originally seen by her PCP on 2/7, sent her to the ER.  Patient denies any fever, chills, nausea or vomiting.  Her labs including CBC, CMP and serum lipase were normal.  Patient is accompanied by her community coach who informed that they tried bowel regimen which helped her move her bowels after the ER visit.  She continues to drink carbonated beverages.  She does report abdominal bloating.  She has ongoing symptoms.  Patient was evaluated in 2015 for rectal bleeding and proctitis/colitis.  Fecal calprotectin levels, CRP and ESR were normal.  She was treated with cortisone enemas by [facility] gastroenterologist at that time  Patient does not smoke or drink alcohol  NSAIDs: None  Antiplts/Anticoagulants/Anti thrombotics: None  GI Procedures: None  Past Medical History:  Diagnosis Date   B12 deficiency anemia    Chronic back pain    Chronic fatigue syndrome    Constipation    Depression    Elevated liver enzymes    Endometriosis    Fibroids    Hepatic steatosis    Hypothyroid    Last menstrual period (LMP) > 10 days ago 09/12/15   Mental retardation    Migraine    Post traumatic stress disorder    Posttraumatic stress disorder    PUD (peptic ulcer disease)    Scoliosis    Splenomegaly     Uterine fibroid     Past Surgical History:  Procedure Laterality Date   ABDOMINAL HYSTERECTOMY     BACK SURGERY     COLONOSCOPY  04/27/2006   Wohl-colitis, internal hemorrhoids   COLONOSCOPY  02/2013   focal right colitis, left colon biopsy negative, focal active proctitis without chronicity   ESOPHAGOGASTRODUODENOSCOPY  02/2013   multiple peptic & duodenal ulcers, negative h pylori   INTRAUTERINE DEVICE INSERTION  2012   LAPAROSCOPIC ENDOMETRIOSIS FULGURATION  2015   [name]   OVARIAN CYST REMOVAL       Current Outpatient Medications:    acetaminophen (TYLENOL) 500 MG tablet, 2 TABS PO BID PRN FOR HEADACHES AND PAINS, Disp: 45 tablet, Rfl: 5   albuterol (VENTOLIN HFA) 108 (90 Base) MCG/ACT inhaler, Inhale 2 puffs into the lungs every 6 (six) hours as needed for wheezing or shortness of breath., Disp: 8 g, Rfl: 5   buPROPion (WELLBUTRIN) 75 MG tablet, Take 1 tablet (75 mg total) by mouth daily with breakfast., Disp: 90 tablet, Rfl: 0   cetirizine (ZYRTEC ALLERGY) 10 MG tablet, Take 1 tablet (10 mg total) by mouth daily., Disp: 90 tablet, Rfl: 1   docusate sodium (COLACE) 100 MG capsule, TAKE 1 CAPSULE BY MOUTH ONCE DAILY FOR STOOL SOFTENER, Disp: 90 capsule, Rfl: 1   EPINEPHrine 0.3 mg/0.3 mL IJ SOAJ injection, USE FOR ANAPHYLAXIS, Disp: 1 each, Rfl:  5   fluticasone (FLONASE) 50 MCG/ACT nasal spray, Place 1 spray into both nostrils daily., Disp: 16 g, Rfl: 5   fluticasone (FLOVENT HFA) 220 MCG/ACT inhaler, INHALE 2 PUFFS INTO THE LUNGS, 2 TIMES PER DAY. RINSE MOUTH AFTER INHALATIONS, Disp: 12 g, Rfl: 11   hydrOXYzine (ATARAX) 25 MG tablet, Take 25 mg by mouth 3 (three) times daily., Disp: , Rfl:    ibuprofen (ADVIL) 600 MG tablet, ONE TAB PO BID PRN FOR PAIN AND HEADACHES, Disp: 30 tablet, Rfl: 5   meclizine (ANTIVERT) 12.5 MG tablet, One tab po tid prn for dizziness, Disp: 30 tablet, Rfl: 1   omeprazole (PRILOSEC) 40 MG capsule, Take 1 capsule (40 mg total) by mouth 2 (two) times  daily., Disp: 60 capsule, Rfl: 5   ondansetron (ZOFRAN-ODT) 4 MG disintegrating tablet, Take 1 tablet (4 mg total) by mouth every 8 (eight) hours as needed for nausea or vomiting., Disp: 20 tablet, Rfl: 0   tamsulosin (FLOMAX) 0.4 MG CAPS capsule, , Disp: , Rfl:    topiramate (TOPAMAX) 50 MG tablet, Take 1 tablet (50 mg total) by mouth every evening., Disp: 30 tablet, Rfl: 5   traMADol (ULTRAM) 50 MG tablet, ONE TAB PO BID PRN FOR PAIN AND HEADCAHES, Disp: 20 tablet, Rfl: 3   traZODone (DESYREL) 50 MG tablet, Take 0.5 tablets (25 mg total) by mouth at bedtime., Disp: 45 tablet, Rfl: 0   triamcinolone cream (KENALOG) 0.1 %, Apply 1 Application topically 2 (two) times daily as needed (itching and rash)., Disp: 453.6 g, Rfl: 1   Family History  Problem Relation Age of Onset   Breast cancer Mother 34   COPD Father    Lung cancer Father 36   Dementia Father    Diabetes Sister    Diabetes Brother    Ovarian cancer Paternal Aunt    Colon cancer Maternal Grandfather 36   Liver disease Maternal Aunt        ?etiology   Lymphoma Maternal Grandmother 29   Lung cancer Other 10       maternal great grandfather   Stomach cancer Other 75       maternal great grandmother     Social History   Tobacco Use   Smoking status: Never   Smokeless tobacco: Never  Vaping Use   Vaping Use: Never used  Substance Use Topics   Alcohol use: Not Currently    Alcohol/week: 0.0 standard drinks of alcohol   Drug use: No    Allergies as of 06/08/2022 - Review Complete 06/08/2022  Allergen Reaction Noted   Other Anaphylaxis 08/30/2013   Peanut butter flavor Swelling 08/24/2014   Sertraline Other (See Comments) 11/26/2014   Codeine Nausea And Vomiting 12/10/2011   Paroxetine Other (See Comments) 09/28/2014   Paroxetine hcl Other (See Comments) 12/10/2011   Prednisone & diphenhydramine Rash 07/23/2021   Venlafaxine Other (See Comments) 09/28/2014   Zoloft [sertraline hcl] Other (See Comments) 12/10/2011     Review of Systems:    All systems reviewed and negative except where noted in HPI.   Physical Exam:  BP 121/76 (BP Location: Right Arm, Patient Position: Sitting, Cuff Size: Normal)   Pulse 82   Temp 98.3 F (36.8 C) (Oral)   Ht '5\' 3"'$  (1.6 m)   Wt 191 lb 4 oz (86.8 kg)   LMP 12/06/2015   BMI 33.88 kg/m  Patient's last menstrual period was 12/06/2015.  General:   Alert,  Well-developed, well-nourished, pleasant and cooperative in NAD Head:  Normocephalic and atraumatic. Eyes:  Sclera clear, no icterus.   Conjunctiva pink. Ears:  Normal auditory acuity. Nose:  No deformity, discharge, or lesions. Mouth:  No deformity or lesions,oropharynx pink & moist. Neck:  Supple; no masses or thyromegaly. Lungs:  Respirations even and unlabored.  Clear throughout to auscultation.   No wheezes, crackles, or rhonchi. No acute distress. Heart:  Regular rate and rhythm; no murmurs, clicks, rubs, or gallops. Abdomen:  Normal bowel sounds. Soft, obese, left lower quadrant tenderness and non-distended without masses, hepatosplenomegaly or hernias noted.  No guarding or rebound tenderness.   Rectal: Not performed Msk:  Symmetrical without gross deformities. Good, equal movement & strength bilaterally. Pulses:  Normal pulses noted. Extremities:  No clubbing or edema.  No cyanosis. Neurologic:  Alert and oriented x3;  grossly normal neurologically. Skin:  Intact without significant lesions or rashes. No jaundice. Psych:  Alert and cooperative. Normal mood and affect.  Imaging Studies: Reviewed  Assessment and Plan:   Teresa Crawford is a 37 y.o. female with history of MR, obesity, chronic GERD, endometriosis, total hysterectomy is seen in consultation for left lower quadrant discomfort associated with bloody diarrhea and abdominal bloating  Recommend CBC with differential, CRP, CMP Recommend CT abdomen and pelvis with contrast Recommend GI profile PCR to rule out any infection If above  workup is negative, recommend colonoscopy for further evaluation  Chronic GERD Omeprazole 40 mg twice daily before meals Discussed about antireflux lifestyle Advised patient to completely stop carbonated beverages  Follow up after above workup   Cephas Darby, MD

## 2022-06-09 ENCOUNTER — Encounter: Payer: Self-pay | Admitting: Gastroenterology

## 2022-06-09 ENCOUNTER — Telehealth: Payer: Self-pay

## 2022-06-09 LAB — COMPREHENSIVE METABOLIC PANEL
ALT: 13 IU/L (ref 0–32)
AST: 11 IU/L (ref 0–40)
Albumin/Globulin Ratio: 1.5 (ref 1.2–2.2)
Albumin: 4.2 g/dL (ref 3.9–4.9)
Alkaline Phosphatase: 106 IU/L (ref 44–121)
BUN/Creatinine Ratio: 11 (ref 9–23)
BUN: 9 mg/dL (ref 6–20)
Bilirubin Total: 0.3 mg/dL (ref 0.0–1.2)
CO2: 25 mmol/L (ref 20–29)
Calcium: 9.1 mg/dL (ref 8.7–10.2)
Chloride: 103 mmol/L (ref 96–106)
Creatinine, Ser: 0.84 mg/dL (ref 0.57–1.00)
Globulin, Total: 2.8 g/dL (ref 1.5–4.5)
Glucose: 99 mg/dL (ref 70–99)
Potassium: 4 mmol/L (ref 3.5–5.2)
Sodium: 141 mmol/L (ref 134–144)
Total Protein: 7 g/dL (ref 6.0–8.5)
eGFR: 92 mL/min/{1.73_m2} (ref >59)

## 2022-06-09 LAB — CBC WITH DIFFERENTIAL
Basophils Absolute: 0 10*3/uL (ref 0.0–0.2)
Basos: 0 %
EOS (ABSOLUTE): 0.1 10*3/uL (ref 0.0–0.4)
Eos: 1 %
Hematocrit: 39.9 % (ref 34.0–46.6)
Hemoglobin: 13.2 g/dL (ref 11.1–15.9)
Immature Grans (Abs): 0 10*3/uL (ref 0.0–0.1)
Immature Granulocytes: 0 %
Lymphocytes Absolute: 1.4 10*3/uL (ref 0.7–3.1)
Lymphs: 24 %
MCH: 27.9 pg (ref 26.6–33.0)
MCHC: 33.1 g/dL (ref 31.5–35.7)
MCV: 84 fL (ref 79–97)
Monocytes Absolute: 0.3 10*3/uL (ref 0.1–0.9)
Monocytes: 5 %
Neutrophils Absolute: 4.1 10*3/uL (ref 1.4–7.0)
Neutrophils: 70 %
RBC: 4.73 x10E6/uL (ref 3.77–5.28)
RDW: 13.2 % (ref 11.7–15.4)
WBC: 5.9 10*3/uL (ref 3.4–10.8)

## 2022-06-09 LAB — C-REACTIVE PROTEIN: CRP: 10 mg/L (ref 0–10)

## 2022-06-09 NOTE — Telephone Encounter (Signed)
Patient verbalized understanding of results  

## 2022-06-09 NOTE — Telephone Encounter (Signed)
-----   Message from [name], MD sent at 06/09/2022  4:20 PM EST ----- Please inform patient or her community coach that the CT of her abdomen did not reveal any abnormal findings.  Her blood work results came back normal.  Waiting on her stool studies to rule out infection  Teresa Crawford

## 2022-06-10 DIAGNOSIS — R197 Diarrhea, unspecified: Secondary | ICD-10-CM | POA: Diagnosis not present

## 2022-06-12 LAB — GI PROFILE, STOOL, PCR

## 2022-06-23 ENCOUNTER — Telehealth: Payer: Self-pay | Admitting: Gastroenterology

## 2022-06-23 ENCOUNTER — Other Ambulatory Visit: Payer: Self-pay

## 2022-06-23 DIAGNOSIS — R197 Diarrhea, unspecified: Secondary | ICD-10-CM

## 2022-06-23 MED ORDER — NA SULFATE-K SULFATE-MG SULF 17.5-3.13-1.6 GM/177ML PO SOLN: 354.0000 mL | Freq: Once | ORAL | 0 refills | Status: AC

## 2022-06-23 NOTE — Telephone Encounter (Signed)
Patient called requesting results from a stool sample.

## 2022-06-23 NOTE — Telephone Encounter (Signed)
Called patient and informed patient Dr. Marius Ditch sent her a mychart message letting her know her stool test were normal. Patient asked if she needed a follow up appointment. Per Dr. Marius Ditch office visit note if patient test are negative then colonoscopy needed to be done. Made sure this was correct with provider. Patient states she is okay with colonoscopy. Schedule for 07/16/2022. Went over instructions, mailed them and sent to [employer].

## 2022-06-23 NOTE — Addendum Note (Signed)
Addended by: Ulyess Blossom L on: 06/23/2022 03:59 PM   Modules accepted: Orders

## 2022-07-01 ENCOUNTER — Ambulatory Visit (INDEPENDENT_AMBULATORY_CARE_PROVIDER_SITE_OTHER): Payer: Medicare Other | Admitting: Licensed Clinical Social Worker

## 2022-07-01 DIAGNOSIS — F4310 Post-traumatic stress disorder, unspecified: Secondary | ICD-10-CM

## 2022-07-01 DIAGNOSIS — Z634 Disappearance and death of family member: Secondary | ICD-10-CM

## 2022-07-01 NOTE — Progress Notes (Signed)
Virtual Visit via Video Note  I connected with Teresa Crawford on 07/01/22 at  2:00 PM EDT by a video enabled telemedicine application and verified that I am speaking with the correct person using two identifiers.  Location: Patient: home Provider" [place] Office   I discussed the limitations of evaluation and management by telemedicine and the availability of in person appointments. The patient expressed understanding and agreed to proceed.  I discussed the assessment and treatment plan with the patient. The patient was provided an opportunity to ask questions and all were answered. The patient agreed with the plan and demonstrated an understanding of the instructions.   The patient was advised to call back or seek an in-person evaluation if the symptoms worsen or if the condition fails to improve as anticipated.  I provided 45 minutes of non-face-to-face time during this encounter.   [place], LCSW   THERAPIST PROGRESS NOTE  Session Time: 2-245p  Participation Level: Active  Behavioral Response: NeatAlertAnxious and Depressed  Type of Therapy: Individual Therapy  ProgressTowards Goals: Progressing  Treatment Goals addressed: Develop healthy thinking patterns and beliefs about self, others, and the world that lead to the alleviation and help prevent the relapse of depression per self report 3 out of 5 sessions documented   Reduce the negative impact trauma related symptoms have on social, occupational, and family functioning per pt self report 3 out of 5 sessions documented.   Interventions:  CBT, trauma focused; grief counseling   Summary: Teresa Crawford is a 37 y.o. female who presents with continuing symptoms related to PTSD diagnosis. Pt reports that she has not been feeling well physically, and this has impacted her overall mood.  Patient reports that she is continuing to experience gastrointestinal distress.  Patient reports  that she has had some vomiting and rectal bleeding.  Patient reports that she has been to the emergency room, and has followed up with all assessments and doctor appointments since that time.  Allowed pt to explore and express thoughts and feelings as associated with recent life situations and external stressors.  Patient identifies ongoing gastrointestinal distress as a current stressor.  Patient reports that she has a lot of anxiety and worries about the test results, and patient feels that her symptoms are impacting her overall functioning.  Patient states that she is in a lot of pain, and cannot take painkillers, so she is constantly using self-care behaviors to regulate pain.  Patient states that she was out of work for 3 weeks due to her symptoms.  Patient states that her work environment are very supportive, and are willing to make accommodations.  Patient states that she had a grandfather that died of colon cancer, so she feels that this is also another trigger for the anxiety that she is experiencing currently.  Patient states that she is back together with her boyfriend--patient admits that there were some communication deficits, and patient does take accountability for the errors.  Patient states that she had an opportunity to discuss and strengthen communication skills between herself and boyfriend.  Patient states that they also discussed ways of problem solving and conflict resolution if they are in disagreement.  Patient states that she feels more secure about the relationship after they have communicated about it.  Patient states that she has a new neighbor that lives close by--patient is very close with this neighbor and states that this is her best friend.  Patient does admit that 1 day he was  visiting, and her boyfriend heard him talking in the background, and patient was not honest with her boyfriend about the best friend.  Patient states that she feels better now that she has disclosed to  her boyfriend that the neighbor is her new best friend.  Patient states that she has plans to see her sister soon--they have made plans to put flowers on their mother's grave for the mother's birthday.  Continued recommendations are as follows: self care behaviors, positive social engagements, focusing on overall work/home/life balance, and focusing on positive physical and emotional wellness.   Suicidal/Homicidal: No  Therapist Response: Pt is continuing to apply interventions learned in session into daily life situations. Pt is currently on track to meet goals utilizing interventions mentioned above. Personal growth and progress noted. Treatment to continue as indicated.   Patient continues to make good progress with overall self understanding, self insight, and quick thinking in the midst of a medical crisis.  Patient is continuing to develop her capacity for improved self-care and life management, especially in this time of recovery.  Patient is demonstrating a growing capacity in her ability to express and communicate with romantic partner, and to articulate problem solving and conflict resolution skills.  Plan: Return again in 4 weeks.  Encounter Diagnoses  Name Primary?   Posttraumatic stress disorder Yes   Bereavement    Collaboration of Care: Other pt encouraged to continue care with psychiatrist of record, Dr. Shea [place]  Patient/Guardian was advised Release of Information must be obtained prior to any record release in order to collaborate their care with an outside provider. Patient/Guardian was advised if they have not already done so to contact the registration department to sign all necessary forms in order for [place] to release information regarding their care.   Consent: Patient/Guardian gives verbal consent for treatment and assignment of benefits for services provided during this visit. Patient/Guardian expressed understanding and agreed to proceed.    [place],  LCSW 07/01/2022

## 2022-07-09 ENCOUNTER — Encounter: Payer: Self-pay | Admitting: Gastroenterology

## 2022-07-11 ENCOUNTER — Other Ambulatory Visit: Payer: Self-pay | Admitting: Nurse Practitioner

## 2022-07-11 DIAGNOSIS — Z760 Encounter for issue of repeat prescription: Secondary | ICD-10-CM

## 2022-07-15 ENCOUNTER — Encounter: Payer: Self-pay | Admitting: Gastroenterology

## 2022-07-16 ENCOUNTER — Ambulatory Visit: Payer: Medicare Other | Admitting: Anesthesiology

## 2022-07-16 ENCOUNTER — Ambulatory Visit
Admission: RE | Admit: 2022-07-16 | Discharge: 2022-07-16 | Disposition: A | Discharge status: Home / Self Care | Payer: Medicare Other | Attending: Gastroenterology | Admitting: Gastroenterology

## 2022-07-16 ENCOUNTER — Encounter: Payer: Self-pay | Admitting: Gastroenterology

## 2022-07-16 ENCOUNTER — Encounter
Admission: RE | Disposition: A | Discharge status: Home / Self Care | Payer: Self-pay | Source: Home / Self Care | Attending: Gastroenterology

## 2022-07-16 DIAGNOSIS — R197 Diarrhea, unspecified: Secondary | ICD-10-CM

## 2022-07-16 DIAGNOSIS — G8929 Other chronic pain: Secondary | ICD-10-CM | POA: Diagnosis not present

## 2022-07-16 DIAGNOSIS — R1032 Left lower quadrant pain: Secondary | ICD-10-CM | POA: Diagnosis not present

## 2022-07-16 DIAGNOSIS — F418 Other specified anxiety disorders: Secondary | ICD-10-CM | POA: Insufficient documentation

## 2022-07-16 DIAGNOSIS — F4310 Post-traumatic stress disorder, unspecified: Secondary | ICD-10-CM | POA: Diagnosis not present

## 2022-07-16 DIAGNOSIS — K625 Hemorrhage of anus and rectum: Secondary | ICD-10-CM | POA: Diagnosis not present

## 2022-07-16 DIAGNOSIS — Z7951 Long term (current) use of inhaled steroids: Secondary | ICD-10-CM | POA: Insufficient documentation

## 2022-07-16 DIAGNOSIS — K639 Disease of intestine, unspecified: Secondary | ICD-10-CM

## 2022-07-16 DIAGNOSIS — K6389 Other specified diseases of intestine: Secondary | ICD-10-CM | POA: Diagnosis not present

## 2022-07-16 DIAGNOSIS — F32A Depression, unspecified: Secondary | ICD-10-CM | POA: Insufficient documentation

## 2022-07-16 DIAGNOSIS — K219 Gastro-esophageal reflux disease without esophagitis: Secondary | ICD-10-CM | POA: Insufficient documentation

## 2022-07-16 HISTORY — PX: COLONOSCOPY WITH PROPOFOL: SHX5780

## 2022-07-16 SURGERY — COLONOSCOPY WITH PROPOFOL
Anesthesia: General

## 2022-07-16 MED ORDER — PROPOFOL 10 MG/ML IV BOLUS
INTRAVENOUS | Status: DC | PRN
  Administered 2022-07-16: 50 mg via INTRAVENOUS
  Administered 2022-07-16: 100 mg via INTRAVENOUS

## 2022-07-16 MED ORDER — PROPOFOL 500 MG/50ML IV EMUL
INTRAVENOUS | Status: DC | PRN
  Administered 2022-07-16: 100 ug/kg/min via INTRAVENOUS

## 2022-07-16 MED ORDER — LIDOCAINE HCL (CARDIAC) PF 100 MG/5ML IV SOSY
PREFILLED_SYRINGE | INTRAVENOUS | Status: DC | PRN
  Administered 2022-07-16: 50 mg via INTRAVENOUS

## 2022-07-16 MED ORDER — SODIUM CHLORIDE 0.9 % IV SOLN: INTRAVENOUS | Status: DC

## 2022-07-16 MED ORDER — LIDOCAINE HCL (PF) 2 % IJ SOLN
INTRAMUSCULAR | Status: AC
  Filled 2022-07-16: qty 5

## 2022-07-16 MED ORDER — PROPOFOL 10 MG/ML IV BOLUS
INTRAVENOUS | Status: AC
  Filled 2022-07-16: qty 40

## 2022-07-16 NOTE — Transfer of Care (Signed)
Immediate Anesthesia Transfer of Care Note  Patient: Teresa Crawford  Procedure(s) Performed: COLONOSCOPY WITH PROPOFOL  Patient Location: Endoscopy Unit  Anesthesia Type:General  Level of Consciousness: sedated  Airway & Oxygen Therapy: Patient Spontanous Breathing  Post-op Assessment: Report given to RN and Post -op Vital signs reviewed and stable  Post vital signs: Reviewed and stable  Last Vitals:  Vitals Value Taken Time  BP 114/75 07/16/22 1050  Temp 36.1 C 07/16/22 1050  Pulse 89 07/16/22 1050  Resp 23 07/16/22 1050  SpO2 100 % 07/16/22 1050  Vitals shown include unvalidated device data.  Last Pain:  Vitals:   07/16/22 1050  TempSrc: Temporal         Complications: No notable events documented.

## 2022-07-16 NOTE — Anesthesia Postprocedure Evaluation (Signed)
Anesthesia Post Note  Patient: ILYANA ELWARD  Procedure(s) Performed: COLONOSCOPY WITH PROPOFOL  Patient location during evaluation: Endoscopy Anesthesia Type: General Level of consciousness: awake and alert Pain management: pain level controlled Vital Signs Assessment: post-procedure vital signs reviewed and stable Respiratory status: spontaneous breathing, nonlabored ventilation, respiratory function stable and patient connected to nasal cannula oxygen Cardiovascular status: blood pressure returned to baseline and stable Postop Assessment: no apparent nausea or vomiting Anesthetic complications: no   No notable events documented.   Last Vitals:  Vitals:   07/16/22 1050 07/16/22 1100  BP: 114/75 (!) 122/92  Pulse: 86   Resp: (!) 25   Temp: (!) 36.1 C   SpO2: 100%     Last Pain:  Vitals:   07/16/22 1050  TempSrc: Temporal  PainSc: Asleep                 Corinda Gubler

## 2022-07-16 NOTE — H&P (Signed)
[name], MD [address]  [address]  [place], [place] [address]  Main: [phone]  Fax: [phone] Pager: [phone]  Primary Care Physician:  Sallyanne Kuster, NP Primary Gastroenterologist:  Dr. [name]  Pre-Procedure History & Physical: HPI:  Teresa Crawford is a 37 y.o. female is here for an colonoscopy.   Past Medical History:  Diagnosis Date   B12 deficiency anemia    Chronic back pain    Chronic fatigue syndrome    Constipation    Depression    Elevated liver enzymes    Endometriosis    Fibroids    Hepatic steatosis    Hypothyroid    Last menstrual period (LMP) > 10 days ago 09/12/15   Mental retardation    Migraine    Post traumatic stress disorder    Posttraumatic stress disorder    PUD (peptic ulcer disease)    Scoliosis    Splenomegaly    Uterine fibroid     Past Surgical History:  Procedure Laterality Date   ABDOMINAL HYSTERECTOMY     BACK SURGERY     COLONOSCOPY  04/27/2006   Wohl-colitis, internal hemorrhoids   COLONOSCOPY  02/2013   focal right colitis, left colon biopsy negative, focal active proctitis without chronicity   ESOPHAGOGASTRODUODENOSCOPY  02/2013   multiple peptic & duodenal ulcers, negative h pylori   INTRAUTERINE DEVICE INSERTION  2012   LAPAROSCOPIC ENDOMETRIOSIS FULGURATION  2015   [name]   OVARIAN CYST REMOVAL      Prior to Admission medications   Medication Sig Start Date End Date Taking? Authorizing Provider  buPROPion (WELLBUTRIN) 75 MG tablet Take 1 tablet (75 mg total) by mouth daily with breakfast. 04/21/22  Yes Eappen, Levin Bacon, MD  cetirizine (ZYRTEC ALLERGY) 10 MG tablet Take 1 tablet (10 mg total) by mouth daily. 01/01/22  Yes Abernathy, Alyssa, NP  docusate sodium (COLACE) 100 MG capsule TAKE 1 CAPSULE BY MOUTH ONCE DAILY FOR STOOL SOFTENER 04/30/22  Yes Abernathy, Alyssa, NP  fluticasone (FLOVENT HFA) 220 MCG/ACT inhaler INHALE 2 PUFFS INTO THE LUNGS, 2 TIMES PER DAY. RINSE MOUTH AFTER  INHALATIONS 04/30/22  Yes Abernathy, Alyssa, NP  omeprazole (PRILOSEC) 40 MG capsule TAKE 1 CAPSULE BY MOUTH 2 TIMES DAILY 07/13/22  Yes Abernathy, [name], NP  tamsulosin (FLOMAX) 0.4 MG CAPS capsule    Yes [provider]  topiramate (TOPAMAX) 50 MG tablet TAKE 1 TABLET BY MOUTH EVERY EVENING 07/13/22  Yes Abernathy, Alyssa, NP  traMADol (ULTRAM) 50 MG tablet ONE TAB PO BID PRN FOR PAIN AND HEADCAHES 04/30/22  Yes Abernathy, Alyssa, NP  traZODone (DESYREL) 50 MG tablet Take 0.5 tablets (25 mg total) by mouth at bedtime. 04/21/22  Yes Jomarie Longs, MD  triamcinolone cream (KENALOG) 0.1 % Apply 1 Application topically 2 (two) times daily as needed (itching and rash). 01/01/22  Yes Abernathy, [name], NP  acetaminophen (TYLENOL) 500 MG tablet 2 TABS PO BID PRN FOR HEADACHES AND PAINS 04/30/22   Sallyanne Kuster, NP  albuterol (VENTOLIN HFA) 108 (90 Base) MCG/ACT inhaler Inhale 2 puffs into the lungs every 6 (six) hours as needed for wheezing or shortness of breath. 04/30/22   Abernathy, Alyssa, NP  EPINEPHrine 0.3 mg/0.3 mL IJ SOAJ injection USE FOR ANAPHYLAXIS 01/01/22   Sallyanne Kuster, NP  fluticasone (FLONASE) 50 MCG/ACT nasal spray Place 1 spray into both nostrils daily. 01/01/22   Sallyanne Kuster, NP  hydrOXYzine (ATARAX) 25 MG tablet Take 25 mg by mouth 3 (three) times daily. 03/31/22  [provider]  ibuprofen (ADVIL) 600 MG tablet ONE TAB PO BID PRN FOR PAIN AND HEADACHES 04/30/22   Sallyanne KusterAbernathy, Alyssa, NP  meclizine (ANTIVERT) 12.5 MG tablet One tab po tid prn for dizziness 04/13/22   McDonough, Lauren K, PA-C  ondansetron (ZOFRAN-ODT) 4 MG disintegrating tablet Take 1 tablet (4 mg total) by mouth every 8 (eight) hours as needed for nausea or vomiting. 04/30/22   Sallyanne KusterAbernathy, Alyssa, NP    Allergies as of 06/24/2022 - Review Complete 06/08/2022  Allergen Reaction Noted   Other Anaphylaxis 08/30/2013   Peanut butter flavor Swelling 08/24/2014   Sertraline Other (See Comments)  11/26/2014   Codeine Nausea And Vomiting 12/10/2011   Paroxetine Other (See Comments) 09/28/2014   Paroxetine hcl Other (See Comments) 12/10/2011   Prednisone & diphenhydramine Rash 07/23/2021   Venlafaxine Other (See Comments) 09/28/2014   Zoloft [sertraline hcl] Other (See Comments) 12/10/2011    Family History  Problem Relation Age of Onset   Breast cancer Mother [id number]   COPD Father    Lung cancer Father [id number]   Dementia Father    Diabetes Sister    Diabetes Brother    Ovarian cancer Paternal Aunt    Colon cancer Maternal Grandfather [id number]   Liver disease Maternal Aunt        ?etiology   Lymphoma Maternal Grandmother [id number]   Lung cancer Other 5870       maternal great grandfather   Stomach cancer Other 8670       maternal great grandmother    Social History   Socioeconomic History   Marital status: Single    Spouse name: Not on file   Number of children: 0   Years of education: Not on file   Highest education level: Not on file  Occupational History   Not on file  Tobacco Use   Smoking status: Never   Smokeless tobacco: Never  Vaping Use   Vaping Use: Never used  Substance and Sexual Activity   Alcohol use: Not Currently    Alcohol/week: 0.0 standard drinks of alcohol   Drug use: No   Sexual activity: Not Currently  Other Topics Concern   Not on file  Social History Narrative   She has brother, half brother & sister live nearby   Both parents deceased   Care managed by Anselm Pancoastalph Scott   Social Determinants of Health   Financial Resource Strain: Low Risk  (02/11/2017)   Overall Financial Resource Strain (CARDIA)    Difficulty of Paying Living Expenses: Not hard at all  Food Insecurity: No Food Insecurity (02/11/2017)   Hunger Vital Sign    Worried About Running Out of Food in the Last Year: Never true    Ran Out of Food in the Last Year: Never true  Transportation Needs: No Transportation Needs (02/11/2017)   PRAPARE - Administrator, Civil ServiceTransportation    Lack of Transportation (Medical):  No    Lack of Transportation (Non-Medical): No  Physical Activity: Inactive (02/11/2017)   Exercise Vital Sign    Days of Exercise per Week: 0 days    Minutes of Exercise per Session: 0 min  Stress: No Stress Concern Present (02/11/2017)   [employer] - Occupational Stress Questionnaire    Feeling of Stress : Not at all  Social Connections: Unknown (02/11/2017)   Social Connection and Isolation Panel [NHANES]    Frequency of Communication with Friends and Family: Patient declined    Frequency of Social Gatherings with Friends and Family:  Patient declined    Attends Religious Services: Patient declined    Active Member of Clubs or Organizations: Patient declined    Attends Banker Meetings: Patient declined    Marital Status: Patient declined  Intimate Partner Violence: Unknown (02/11/2017)   Humiliation, Afraid, Rape, and Kick questionnaire    Fear of Current or Ex-Partner: Patient declined    Emotionally Abused: Patient declined    Physically Abused: Patient declined    Sexually Abused: Patient declined    Review of Systems: See HPI, otherwise negative ROS  Physical Exam: BP 120/81   Pulse 93   Temp (!) 97 F (36.1 C) (Temporal)   Resp 18   Ht 5\' 3"  (1.6 m)   Wt 84.1 kg   LMP 12/06/2015   SpO2 98%   BMI 32.84 kg/m  General:   Alert,  pleasant and cooperative in NAD Head:  Normocephalic and atraumatic. Neck:  Supple; no masses or thyromegaly. Lungs:  Clear throughout to auscultation.    Heart:  Regular rate and rhythm. Abdomen:  Soft, nontender and nondistended. Normal bowel sounds, without guarding, and without rebound.   Neurologic:  Alert and  oriented x4;  grossly normal neurologically.  Impression/Plan: Teresa Crawford is here for an colonoscopy to be performed for left lower quadrant discomfort associated with bloody diarrhea and abdominal bloating   Risks, benefits, limitations, and alternatives regarding  colonoscopy  have been reviewed with the patient.  Questions have been answered.  All parties agreeable.   Lannette Donath, MD  07/16/2022, 9:46 AM

## 2022-07-16 NOTE — Anesthesia Preprocedure Evaluation (Signed)
Anesthesia Evaluation  Patient identified by MRN, date of birth, ID band Patient awake  General Assessment Comment:Patient AO x 3, understands the nature of the procedure. I deem her to have capacity to consent for the anesthetic.  Reviewed: Allergy & Precautions, NPO status , Patient's Chart, lab work & pertinent test results  History of Anesthesia Complications Negative for: history of anesthetic complications  Airway Mallampati: II  TM Distance: >3 FB Neck ROM: Full    Dental  (+) Upper Dentures, Lower Dentures Firmly bonded in place:   Pulmonary asthma , neg sleep apnea, neg COPD, Patient abstained from smoking.Not current smoker Takes flovent daily, not taken today. Breathing feels at baseline   Pulmonary exam normal breath sounds clear to auscultation       Cardiovascular Exercise Tolerance: Good METS(-) hypertension(-) CAD and (-) Past MI negative cardio ROS (-) dysrhythmias  Rhythm:Regular Rate:Normal - Systolic murmurs    Neuro/Psych  Headaches PSYCHIATRIC DISORDERS Anxiety Depression       GI/Hepatic PUD,neg GERD  ,,(+)     (-) substance abuse    Endo/Other  neg diabetesHypothyroidism    Renal/GU negative Renal ROS     Musculoskeletal   Abdominal   Peds  Hematology   Anesthesia Other Findings Past Medical History: No date: B12 deficiency anemia No date: Chronic back pain No date: Chronic fatigue syndrome No date: Constipation No date: Depression No date: Elevated liver enzymes No date: Endometriosis No date: Fibroids No date: Hepatic steatosis No date: Hypothyroid 09/12/15: Last menstrual period (LMP) > 10 days ago No date: Mental retardation No date: Migraine No date: Post traumatic stress disorder No date: Posttraumatic stress disorder No date: PUD (peptic ulcer disease) No date: Scoliosis No date: Splenomegaly No date: Uterine fibroid  Reproductive/Obstetrics                               Anesthesia Physical Anesthesia Plan  ASA: 2  Anesthesia Plan: General   Post-op Pain Management: Minimal or no pain anticipated   Induction: Intravenous  PONV Risk Score and Plan: 3 and Propofol infusion, TIVA and Ondansetron  Airway Management Planned: Nasal Cannula  Additional Equipment: None  Intra-op Plan:   Post-operative Plan:   Informed Consent: I have reviewed the patients History and Physical, chart, labs and discussed the procedure including the risks, benefits and alternatives for the proposed anesthesia with the patient or authorized representative who has indicated his/her understanding and acceptance.     Dental advisory given  Plan Discussed with: CRNA and Surgeon  Anesthesia Plan Comments: (Discussed risks of anesthesia with patient, including possibility of difficulty with spontaneous ventilation under anesthesia necessitating airway intervention, PONV, and rare risks such as cardiac or respiratory or neurological events, and allergic reactions. Discussed the role of CRNA in patient's perioperative care. Patient understands.)         Anesthesia Quick Evaluation

## 2022-07-16 NOTE — Op Note (Signed)
[facility] Gastroenterology Patient Name: Teresa Crawford Procedure Date: 07/16/2022 10:25 AM MRN: [id number] Account #: [id number] Date of Birth: 11/08/1985 Admit Type: Outpatient Age: 37 Room: [facility] ENDO ROOM 3 Gender: Female Note Status: Finalized Instrument Name: Nelda Marseille [id number] Procedure:             Colonoscopy Indications:           Abdominal pain in the left lower quadrant, Rectal                         bleeding Providers:             Toney Reil MD, MD Referring MD:          Sallyanne Kuster (Referring MD) Medicines:             General Anesthesia Complications:         No immediate complications. Estimated blood loss:                         Minimal. Procedure:             Pre-Anesthesia Assessment:                        - Prior to the procedure, a History and Physical was                         performed, and patient medications and allergies were                         reviewed. The patient is competent. The risks and                         benefits of the procedure and the sedation options and                         risks were discussed with the patient. All questions                         were answered and informed consent was obtained.                         Patient identification and proposed procedure were                         verified by the physician, the nurse, the                         anesthesiologist, the anesthetist and the technician                         in the pre-procedure area in the procedure room in the                         endoscopy suite. Mental Status Examination: alert and                         oriented. Airway Examination: normal oropharyngeal  airway and neck mobility. Respiratory Examination:                         clear to auscultation. CV Examination: normal.                         Prophylactic Antibiotics: The patient does not require                          prophylactic antibiotics. Prior Anticoagulants: The                         patient has taken no anticoagulant or antiplatelet                         agents. ASA Grade Assessment: II - A patient with mild                         systemic disease. After reviewing the risks and                         benefits, the patient was deemed in satisfactory                         condition to undergo the procedure. The anesthesia                         plan was to use general anesthesia. Immediately prior                         to administration of medications, the patient was                         re-assessed for adequacy to receive sedatives. The                         heart rate, respiratory rate, oxygen saturations,                         blood pressure, adequacy of pulmonary ventilation, and                         response to care were monitored throughout the                         procedure. The physical status of the patient was                         re-assessed after the procedure.                        After obtaining informed consent, the colonoscope was                         passed under direct vision. Throughout the procedure,                         the patient's blood pressure, pulse, and oxygen  saturations were monitored continuously. The                         Colonoscope was introduced through the anus and                         advanced to the the terminal ileum, with                         identification of the appendiceal orifice and IC                         valve. The colonoscopy was performed without                         difficulty. The patient tolerated the procedure well.                         The quality of the bowel preparation was evaluated                         using the BBPS [facility]([place] Bowel Preparation Scale) with                         scores of: Right Colon = 3, Transverse Colon = 3 and                         Left  Colon = 3 (entire mucosa seen well with no                         residual staining, small fragments of stool or opaque                         liquid). The total BBPS score equals 9. The terminal                         ileum, ileocecal valve, appendiceal orifice, and                         rectum were photographed. Findings:      The perianal and digital rectal examinations were normal. Pertinent       negatives include normal sphincter tone and no palpable rectal lesions.      The terminal ileum appeared normal.      A diffuse area of mildly erythematous mucosa was found in the rectum, in       the recto-sigmoid colon and in the sigmoid colon. Biopsies were taken       with a cold forceps for histology.      The retroflexed view of the distal rectum and anal verge was normal and       showed no anal or rectal abnormalities.      The exam was otherwise without abnormality. Impression:            - The examined portion of the ileum was normal.                        - Erythematous mucosa in the rectum, in the  recto-sigmoid colon and in the sigmoid colon. Biopsied.                        - The distal rectum and anal verge are normal on                         retroflexion view.                        - The examination was otherwise normal. Recommendation:        - Discharge patient to home (with escort).                        - Resume previous diet today.                        - Continue present medications.                        - Await pathology results. Procedure Code(s):     --- Professional ---                        [phone], Colonoscopy, flexible; with biopsy, single or                         multiple Diagnosis Code(s):     --- Professional ---                        K62.89, Other specified diseases of anus and rectum                        K63.89, Other specified diseases of intestine                        R10.32, Left lower quadrant pain                         K62.5, Hemorrhage of anus and rectum CPT copyright 2022 American Medical Association. All rights reserved. The codes documented in this report are preliminary and upon coder review may  be revised to meet current compliance requirements. Dr. Libby Maw Toney Reil MD, MD 07/16/2022 10:49:12 AM This report has been signed electronically. Number of Addenda: 0 Note Initiated On: 07/16/2022 10:25 AM Scope Withdrawal Time: 0 hours 8 minutes 44 seconds  Total Procedure Duration: 0 hours 12 minutes 26 seconds  Estimated Blood Loss:  Estimated blood loss was minimal.      [facility]

## 2022-07-17 ENCOUNTER — Telehealth: Payer: Self-pay | Admitting: Gastroenterology

## 2022-07-17 ENCOUNTER — Encounter: Payer: Self-pay | Admitting: Gastroenterology

## 2022-07-17 LAB — SURGICAL PATHOLOGY

## 2022-07-17 MED ORDER — DICYCLOMINE HCL 10 MG PO CAPS: 20.0000 mg | ORAL_CAPSULE | Freq: Three times a day (TID) | ORAL | 0 refills | Status: DC

## 2022-07-17 NOTE — Telephone Encounter (Signed)
Recommend to try Bentyl 10 mg 1 to 2 pills every 6 hours as needed for abdominal cramps Will follow-up on the pathology results.  She had mild irritation/redness in her left colon and rectum  RV

## 2022-07-17 NOTE — Telephone Encounter (Signed)
Pt had colonoscopy on 07/16/2022 having black stools this morning and a lot of pain she is requesting a call back to see if this is normal.

## 2022-07-17 NOTE — Addendum Note (Signed)
Addended by: Radene Knee L on: 07/17/2022 10:57 AM   Modules accepted: Orders

## 2022-07-17 NOTE — Telephone Encounter (Signed)
Patient is having lower abdominal pain that is aching that is all the time. She states the pain is so bad this morning that she could not move. The pain is a 8 out of 10. She states she had black diarrhea. Has had one bowel movement this morning.  It does not look like no polyps were removed from her colonoscopy.

## 2022-07-17 NOTE — Telephone Encounter (Signed)
Patient verbalized understanding of instructions. Sent medication to the pharmacy  

## 2022-07-21 ENCOUNTER — Encounter: Payer: Self-pay | Admitting: Psychiatry

## 2022-07-21 ENCOUNTER — Telehealth (INDEPENDENT_AMBULATORY_CARE_PROVIDER_SITE_OTHER): Payer: Medicare Other | Admitting: Psychiatry

## 2022-07-21 ENCOUNTER — Telehealth: Payer: Self-pay

## 2022-07-21 DIAGNOSIS — F3342 Major depressive disorder, recurrent, in full remission: Secondary | ICD-10-CM

## 2022-07-21 DIAGNOSIS — F5105 Insomnia due to other mental disorder: Secondary | ICD-10-CM | POA: Diagnosis not present

## 2022-07-21 DIAGNOSIS — F4310 Post-traumatic stress disorder, unspecified: Secondary | ICD-10-CM

## 2022-07-21 MED ORDER — BUPROPION HCL 75 MG PO TABS: 75.0000 mg | ORAL_TABLET | Freq: Every day | ORAL | 0 refills | Status: DC

## 2022-07-21 MED ORDER — TRAZODONE HCL 50 MG PO TABS
25.0000 mg | ORAL_TABLET | Freq: Every day | ORAL | 0 refills | Status: DC
Start: 2022-07-21 — End: 2022-10-20

## 2022-07-21 NOTE — Telephone Encounter (Signed)
Dr. Allegra Lai was referring to Dicyclomine and not Benadryl. Called patient and  and patient verbalized understanding results. Patient states the medication is to strong and states every time she takes the medication she does not remember falling asleep

## 2022-07-21 NOTE — Telephone Encounter (Signed)
Yes, I meant bentyl, she can take  instead , if it still makes her drowsy, we can try hyoscyamine  RV

## 2022-07-21 NOTE — Telephone Encounter (Signed)
-----   Message from Toney Reil, MD sent at 07/20/2022 10:39 PM EDT ----- Please inform patient that the pathology results from colonoscopy came back normal, there is no evidence of inflammatory bowel disease.  She should continue taking Benadryl as needed for left lower quadrant.  [employer]

## 2022-07-21 NOTE — Progress Notes (Signed)
Virtual Visit via Video Note  I connected with Teresa Crawford on 07/21/22 at 11:30 AM EDT by a video enabled telemedicine application and verified that I am speaking with the correct person using two identifiers.  Location Provider Location : [facility] Patient Location : Home  Participants: Patient , Provider    I discussed the limitations of evaluation and management by telemedicine and the availability of in person appointments. The patient expressed understanding and agreed to proceed.   I discussed the assessment and treatment plan with the patient. The patient was provided an opportunity to ask questions and all were answered. The patient agreed with the plan and demonstrated an understanding of the instructions.   The patient was advised to call back or seek an in-person evaluation if the symptoms worsen or if the condition fails to improve as anticipated.   BH MD OP Progress Note  07/21/2022 1:25 PM ROYAL BEIRNE  MRN:  [id number]  Chief Complaint:  Chief Complaint  Patient presents with   Follow-up   Anxiety   Depression   Medication Refill   HPI: Teresa Crawford is a 37 year old Caucasian female who is single, lives in [place], has a history of MDD, PTSD, insomnia was evaluated by telemedicine today.  Patient today reports she is currently struggling with abdominal pain.  She had colonoscopy completed on 07/16/2022.  Reviewed notes per Dr. Allegra Lai.  Patient was started on dicyclomine.'  Patient reports she currently takes it as needed however the medication has been making her drowsy.  She also reports reduced appetite since her colonoscopy.  She does feel tired during the day.  She is not currently back at work however is planning to go back to work Monday.  Patient however reports although she is anxious about her current health issues overall her mood symptoms are okay.  Does not feel depressed.  Reports sleep is good at night.  Although she feels sleepy during the day  likely due to the medication.  Patient denies any suicidality, homicidality or perceptual disturbances.  Patient denies any other concerns today.  Visit Diagnosis:    ICD-10-CM   1. MDD (major depressive disorder), recurrent, in full remission  F33.42 buPROPion (WELLBUTRIN) 75 MG tablet    2. Posttraumatic stress disorder  F43.10     3. Insomnia due to mental condition  F51.05 traZODone (DESYREL) 50 MG tablet   depression      Past Psychiatric History: I have reviewed past psychiatric history from progress note on 08/16/2018.  Past Medical History:  Past Medical History:  Diagnosis Date   B12 deficiency anemia    Chronic back pain    Chronic fatigue syndrome    Constipation    Depression    Elevated liver enzymes    Endometriosis    Fibroids    Hepatic steatosis    Hypothyroid    Last menstrual period (LMP) > 10 days ago 09/12/15   Mental retardation    Migraine    Post traumatic stress disorder    Posttraumatic stress disorder    PUD (peptic ulcer disease)    Scoliosis    Splenomegaly    Uterine fibroid     Past Surgical History:  Procedure Laterality Date   ABDOMINAL HYSTERECTOMY     BACK SURGERY     COLONOSCOPY  04/27/2006   Wohl-colitis, internal hemorrhoids   COLONOSCOPY  02/2013   focal right colitis, left colon biopsy negative, focal active proctitis without chronicity   COLONOSCOPY WITH PROPOFOL N/A  07/16/2022   Procedure: COLONOSCOPY WITH PROPOFOL;  Surgeon: Toney Reil, MD;  Location: [facility] ENDOSCOPY;  Service: Gastroenterology;  Laterality: N/A;   ESOPHAGOGASTRODUODENOSCOPY  02/2013   multiple peptic & duodenal ulcers, negative h pylori   INTRAUTERINE DEVICE INSERTION  2012   LAPAROSCOPIC ENDOMETRIOSIS FULGURATION  2015   [name]   OVARIAN CYST REMOVAL      Family Psychiatric History: I have reviewed family psychiatric history from progress note on 08/16/2018.  Family History:  Family History  Problem Relation Age of Onset   Breast cancer  Mother 34   COPD Father    Lung cancer Father 83   Dementia Father    Diabetes Sister    Diabetes Brother    Ovarian cancer Paternal Aunt    Colon cancer Maternal Grandfather 71   Liver disease Maternal Aunt        ?etiology   Lymphoma Maternal Grandmother 59   Lung cancer Other 47       maternal great grandfather   Stomach cancer Other 77       maternal great grandmother    Social History: I have reviewed social history from progress note on 08/16/2018. Social History   Socioeconomic History   Marital status: Single    Spouse name: Not on file   Number of children: 0   Years of education: Not on file   Highest education level: Not on file  Occupational History   Not on file  Tobacco Use   Smoking status: Never   Smokeless tobacco: Never  Vaping Use   Vaping Use: Never used  Substance and Sexual Activity   Alcohol use: Not Currently    Alcohol/week: 0.0 standard drinks of alcohol   Drug use: No   Sexual activity: Not Currently  Other Topics Concern   Not on file  Social History Narrative   She has brother, half brother & sister live nearby   Both parents deceased   Care managed by Anselm Pancoast   Social Determinants of Health   Financial Resource Strain: Low Risk  (02/11/2017)   Overall Financial Resource Strain (CARDIA)    Difficulty of Paying Living Expenses: Not hard at all  Food Insecurity: No Food Insecurity (02/11/2017)   Hunger Vital Sign    Worried About Running Out of Food in the Last Year: Never true    Ran Out of Food in the Last Year: Never true  Transportation Needs: No Transportation Needs (02/11/2017)   PRAPARE - Administrator, Civil Service (Medical): No    Lack of Transportation (Non-Medical): No  Physical Activity: Inactive (02/11/2017)   Exercise Vital Sign    Days of Exercise per Week: 0 days    Minutes of Exercise per Session: 0 min  Stress: No Stress Concern Present (02/11/2017)   [employer] of Occupational Health -  Occupational Stress Questionnaire    Feeling of Stress : Not at all  Social Connections: Unknown (02/11/2017)   Social Connection and Isolation Panel [NHANES]    Frequency of Communication with Friends and Family: Patient declined    Frequency of Social Gatherings with Friends and Family: Patient declined    Attends Religious Services: Patient declined    Database administrator or Organizations: Patient declined    Attends Banker Meetings: Patient declined    Marital Status: Patient declined    Allergies:  Allergies  Allergen Reactions   Other Anaphylaxis    Peanut butter, uses Epi pen   Peanut  Butter Flavor Swelling    Throat swells Throat swells   Sertraline Other (See Comments)    Panic attacks   Codeine Nausea And Vomiting    nausea   Paroxetine Other (See Comments)    Panic Attack    Paroxetine Hcl Other (See Comments)    Panic attack Other reaction(s): Other (See Comments) Panic attacks   Prednisone & Diphenhydramine Rash   Venlafaxine Other (See Comments)    Panic Attack    Zoloft [Sertraline Hcl] Other (See Comments)    Panic attack    Metabolic Disorder Labs: Lab Results  Component Value Date   HGBA1C 5.5 12/20/2019   No results found for: "PROLACTIN" Lab Results  Component Value Date   CHOL 155 01/15/2022   TRIG 97 01/15/2022   HDL 34 (L) 01/15/2022   CHOLHDL 4.6 (H) 01/15/2022   LDLCALC 103 (H) 01/15/2022   LDLCALC 99 12/27/2020   Lab Results  Component Value Date   TSH 1.990 01/15/2022   TSH 1.750 12/27/2020    Therapeutic Level Labs: No results found for: "LITHIUM" No results found for: "VALPROATE" No results found for: "CBMZ"  Current Medications: Current Outpatient Medications  Medication Sig Dispense Refill   acetaminophen (TYLENOL) 500 MG tablet 2 TABS PO BID PRN FOR HEADACHES AND PAINS 45 tablet 5   albuterol (VENTOLIN HFA) 108 (90 Base) MCG/ACT inhaler Inhale 2 puffs into the lungs every 6 (six) hours as needed for  wheezing or shortness of breath. 8 g 5   cetirizine (ZYRTEC ALLERGY) 10 MG tablet Take 1 tablet (10 mg total) by mouth daily. 90 tablet 1   dicyclomine (BENTYL) 10 MG capsule Take 2 capsules (20 mg total) by mouth 4 (four) times daily -  before meals and at bedtime. 90 capsule 0   docusate sodium (COLACE) 100 MG capsule TAKE 1 CAPSULE BY MOUTH ONCE DAILY FOR STOOL SOFTENER 90 capsule 1   EPINEPHrine 0.3 mg/0.3 mL IJ SOAJ injection USE FOR ANAPHYLAXIS 1 each 5   fluticasone (FLONASE) 50 MCG/ACT nasal spray Place 1 spray into both nostrils daily. 16 g 5   fluticasone (FLOVENT HFA) 220 MCG/ACT inhaler INHALE 2 PUFFS INTO THE LUNGS, 2 TIMES PER DAY. RINSE MOUTH AFTER INHALATIONS 12 g 11   hydrOXYzine (ATARAX) 25 MG tablet Take 25 mg by mouth 3 (three) times daily.     ibuprofen (ADVIL) 600 MG tablet ONE TAB PO BID PRN FOR PAIN AND HEADACHES 30 tablet 5   meclizine (ANTIVERT) 12.5 MG tablet One tab po tid prn for dizziness 30 tablet 1   omeprazole (PRILOSEC) 40 MG capsule TAKE 1 CAPSULE BY MOUTH 2 TIMES DAILY 60 capsule 5   ondansetron (ZOFRAN-ODT) 4 MG disintegrating tablet Take 1 tablet (4 mg total) by mouth every 8 (eight) hours as needed for nausea or vomiting. 20 tablet 0   tamsulosin (FLOMAX) 0.4 MG CAPS capsule      topiramate (TOPAMAX) 50 MG tablet TAKE 1 TABLET BY MOUTH EVERY EVENING 30 tablet 5   traMADol (ULTRAM) 50 MG tablet ONE TAB PO BID PRN FOR PAIN AND HEADCAHES 20 tablet 3   triamcinolone cream (KENALOG) 0.1 % Apply 1 Application topically 2 (two) times daily as needed (itching and rash). 453.6 g 1   buPROPion (WELLBUTRIN) 75 MG tablet Take 1 tablet (75 mg total) by mouth daily with breakfast. 90 tablet 0   Na Sulfate-K Sulfate-Mg Sulf 17.5-3.13-1.6 GM/177ML SOLN Take by mouth. (Patient not taking: Reported on 07/21/2022)     traZODone (  DESYREL) 50 MG tablet Take 0.5 tablets (25 mg total) by mouth at bedtime. 45 tablet 0   No current facility-administered medications for this visit.      Musculoskeletal: Strength & Muscle Tone:  UTA Gait & Station: normal Patient leans: N/A  Psychiatric Specialty Exam: Review of Systems  Constitutional:  Positive for fatigue.  Gastrointestinal:  Positive for abdominal pain.  Psychiatric/Behavioral:  Positive for sleep disturbance. The patient is nervous/anxious.   All other systems reviewed and are negative.   Last menstrual period 12/06/2015.There is no height or weight on file to calculate BMI.  General Appearance: Casual  Eye Contact:  Fair  Speech:  Normal Rate  Volume:  Normal  Mood:  Anxious  Affect:  Restricted  Thought Process:  Goal Directed and Descriptions of Associations: Intact  Orientation:  Full (Time, Place, and Person)  Thought Content: Logical   Suicidal Thoughts:  No  Homicidal Thoughts:  No  Memory:  Immediate;   Fair Recent;   Fair Remote;   Fair  Judgement:  Fair  Insight:  Fair  Psychomotor Activity:  Normal  Concentration:  Concentration: Fair and Attention Span: Fair  Recall:  [employer] of Knowledge: Fair  Language: Fair  Akathisia:  No  Handed:  Right  AIMS (if indicated): not done  Assets:  Communication Skills Desire for Improvement Housing Social Support  ADL's:  Intact  Cognition: WNL  Sleep:   Excessive during the day due to new medication for GI problems   Screenings: AIMS    Flowsheet Row Video Visit from 01/20/2022 in [facility] Video Visit from 10/22/2021 in [facility]  AIMS Total Score 0 0      GAD-7    Flowsheet Row Office Visit from 04/21/2022 in [facility] Video Visit from 01/20/2022 in [facility] Video Visit from 10/22/2021 in [facility] Office Visit from 04/08/2021 in [facility]  Total GAD-7 Score 0 1 0 0       Mini-Mental    Flowsheet Row Office Visit from 01/01/2022 in [facility] Clinical Support from 12/25/2020 in [facility] Clinical Support from 12/19/2019 in [facility] Office Visit from 10/13/2017 in [name]  Total Score (max 30 points ) 29 29 30 27       PHQ2-9    Flowsheet Row Office Visit from 04/21/2022 in [facility] Video Visit from 01/20/2022 in [facility] Video Visit from 10/22/2021 in [facility] Counselor from 09/30/2021 in [facility] Office Visit from 09/03/2021 in [facility]  PHQ-2 Total Score 0 0 0 2 0  PHQ-9 Total Score 0 -- -- 11 --      Flowsheet Row Video Visit from 07/21/2022 in [facility] Admission (Discharged) from 07/16/2022 in [facility] Counselor from 07/01/2022 in [facility] RISK CATEGORY No Risk No Risk No Risk        Assessment and Plan: ELIDIA BONENFANT is a 37 year old Caucasian female with history of MDD, PTSD, insomnia, currently on SSD, works part-time was evaluated by telemedicine today.  Patient is currently stable with regards to her mood although currently struggling with GI problems, possible side effects to  dicyclomine, will benefit from the following plan.  Plan MDD in remission Wellbutrin 75 mg p.o. daily.  Provided education regarding drug to drug interaction between bupropion/Wellbutrin and dicyclomine.  Anticholinergic effects of dicyclomine can be elevated and coadministered with bupropion. Trazodone 25 mg p.o. nightly Continue CBT with Ms. Christina Hussami as needed  PTSD-stable Continue CBT  Insomnia-stable Trazodone 25-50 mg p.o. nightly Patient does report  excessive sleepiness during the day likely medication induced.  Patient advised to discuss with GI provider.  Follow-up in clinic in 3 months or sooner if needed.   Collaboration of Care: Collaboration of Care: Other patient encouraged to talk to GI provider regarding side effects of dicyclomine reviewed notes per Dr. Allegra Lai dated 07/16/2022.Marland Kitchen  Patient/Guardian was advised Release of Information must be obtained prior to any record release in order to collaborate their care with an outside provider. Patient/Guardian was advised if they have not already done so to contact the registration department to sign all necessary forms in order for [place] to release information regarding their care.   Consent: Patient/Guardian gives verbal consent for treatment and assignment of benefits for services provided during this visit. Patient/Guardian expressed understanding and agreed to proceed.   This note was generated in part or whole with voice recognition software. Voice recognition is usually quite accurate but there are transcription errors that can and very often do occur. I apologize for any typographical errors that were not detected and corrected.    Jomarie Longs, MD 07/21/2022, 1:25 PM

## 2022-07-21 NOTE — Telephone Encounter (Signed)
Patient will try only using one pill as needed.

## 2022-07-22 ENCOUNTER — Other Ambulatory Visit: Payer: Self-pay | Admitting: Gastroenterology

## 2022-07-29 ENCOUNTER — Ambulatory Visit (INDEPENDENT_AMBULATORY_CARE_PROVIDER_SITE_OTHER): Payer: Medicare Other | Admitting: Licensed Clinical Social Worker

## 2022-07-29 DIAGNOSIS — F4310 Post-traumatic stress disorder, unspecified: Secondary | ICD-10-CM | POA: Diagnosis not present

## 2022-07-29 NOTE — Progress Notes (Signed)
Virtual Visit via Video Note  I connected with Teresa Crawford on 07/29/22 at  2:00 PM EDT by a video enabled telemedicine application and verified that I am speaking with the correct person using two identifiers.  Location: Patient: home Provider" [facility]   I discussed the limitations of evaluation and management by telemedicine and the availability of in person appointments. The patient expressed understanding and agreed to proceed.  I discussed the assessment and treatment plan with the patient. The patient was provided an opportunity to ask questions and all were answered. The patient agreed with the plan and demonstrated an understanding of the instructions.   The patient was advised to call back or seek an in-person evaluation if the symptoms worsen or if the condition fails to improve as anticipated.  I provided 30 minutes of non-face-to-face time during this encounter.   Shawnita Krizek R Onie Hayashi, LCSW   THERAPIST PROGRESS NOTE  Session Time: 2-230p  Participation Level: Active  Behavioral Response: NeatAlertAnxious and Depressed  Type of Therapy: Individual Therapy  ProgressTowards Goals: Progressing  Treatment Goals addressed: Develop healthy thinking patterns and beliefs about self, others, and the world that lead to the alleviation and help prevent the relapse of depression per self report 3 out of 5 sessions documented   Reduce the negative impact trauma related symptoms have on social, occupational, and family functioning per pt self report 3 out of 5 sessions documented.   Interventions:  CBT, trauma focused; grief counseling   Summary: Teresa Crawford is a 37 y.o. female who presents with continuing symptoms related to PTSD diagnosis. Pt reports that she has not been feeling well physically, and this has impacted her overall mood.  Pt reporting acute stomach/abdominal pain. Pt still waiting on referrals/assessments/treatments.    Clinician assisted pt with identifying situations/scenarios/schemas triggering anxiety and/or depression symptoms. Allowed pt to explore and express thoughts and feelings and discussed current coping mechanisms. Reviewed changes/recommendations.  Assisted pt with identifying anxiety triggers (boyfriend/relationship concerns).  Pt did break up with previous boyfriend Geologist, engineering) and is with a new boyfriend (her neighbor, Alycia Rossetti).   Clinician assisted pt with identifying current levels of chronic pain, and explored medical interventions for treating chronic pain. Pt has had several doctor appts/ED visits recently for vomiting, rectal bleedingt, and pain.  Discussed overall impact of pain on daily activities, relationships, and ability/inability to engage in self care or recreational activities.  Pt feels its impacting her ability to work.   Continued recommendations are as follows: self care behaviors, positive social engagements, focusing on overall work/home/life balance, and focusing on positive physical and emotional wellness.   Suicidal/Homicidal: No  Therapist Response: Pt is continuing to apply interventions learned in session into daily life situations. Pt is currently on track to meet goals utilizing interventions mentioned above. Personal growth and progress noted. Treatment to continue as indicated.   Plan: Return again in 4 weeks.  Encounter Diagnosis  Name Primary?   Posttraumatic stress disorder Yes    Collaboration of Care: Other pt encouraged to continue care with psychiatrist of record, Dr. Elna Breslow  Patient/Guardian was advised Release of Information must be obtained prior to any record release in order to collaborate their care with an outside provider. Patient/Guardian was advised if they have not already done so to contact the registration department to sign all necessary forms in order for [place] to release information regarding their care.   Consent: Patient/Guardian gives verbal  consent for treatment and assignment of benefits for  services provided during this visit. Patient/Guardian expressed understanding and agreed to proceed.    Ernest Haber Tavyn Kurka, LCSW 07/29/2022

## 2022-07-30 ENCOUNTER — Telehealth: Payer: Self-pay | Admitting: Gastroenterology

## 2022-07-30 DIAGNOSIS — R1032 Left lower quadrant pain: Secondary | ICD-10-CM

## 2022-07-30 NOTE — Telephone Encounter (Signed)
Patient states the lower abdominal pain is getting worse. She states the pain is a aching pain that constant. She states she feels like her stomach is worse. The pain got worse on Friday. States Monday at work she was hurting so bad she could barely move. She states she has tried the dicyclomine but every time she takes the medication she feels like she over dosing on it and makes her very fatigue. She has had some nausea but no vomiting. Denies any constipation or diarrhea.

## 2022-07-30 NOTE — Telephone Encounter (Signed)
Pt left message stating that pain is worse

## 2022-07-31 MED ORDER — HYOSCYAMINE SULFATE 0.125 MG PO TBDP: 0.1250 mg | ORAL_TABLET | ORAL | 0 refills | Status: DC | PRN

## 2022-07-31 NOTE — Telephone Encounter (Signed)
Let's check fecal calprotectin levels And, instead of dicyclomine, let's try hyoscyamine every 6-8 hours as needed for left lower quadrant pain  RV

## 2022-07-31 NOTE — Telephone Encounter (Signed)
Patient verbalized understanding of instructions. She will go pick up stool kit and medication

## 2022-07-31 NOTE — Addendum Note (Signed)
Addended by: Radene Knee L on: 07/31/2022 09:42 AM   Modules accepted: Orders

## 2022-08-06 DIAGNOSIS — R1032 Left lower quadrant pain: Secondary | ICD-10-CM | POA: Diagnosis not present

## 2022-08-11 ENCOUNTER — Ambulatory Visit: Payer: Medicare Other | Admitting: Gastroenterology

## 2022-08-11 LAB — CALPROTECTIN, FECAL: Calprotectin, Fecal: 17 ug/g (ref 0–120)

## 2022-08-25 ENCOUNTER — Encounter: Payer: Self-pay | Admitting: Nurse Practitioner

## 2022-08-25 ENCOUNTER — Ambulatory Visit (INDEPENDENT_AMBULATORY_CARE_PROVIDER_SITE_OTHER): Payer: Medicare Other | Admitting: Nurse Practitioner

## 2022-08-25 VITALS — BP 130/74 | HR 78 | Temp 98.3°F | Resp 16 | Ht 63.0 in | Wt 191.2 lb

## 2022-08-25 DIAGNOSIS — Z79899 Other long term (current) drug therapy: Secondary | ICD-10-CM

## 2022-08-25 DIAGNOSIS — J4520 Mild intermittent asthma, uncomplicated: Secondary | ICD-10-CM | POA: Diagnosis not present

## 2022-08-25 DIAGNOSIS — J301 Allergic rhinitis due to pollen: Secondary | ICD-10-CM

## 2022-08-25 MED ORDER — CETIRIZINE HCL 10 MG PO TABS: 10.0000 mg | ORAL_TABLET | Freq: Every day | ORAL | 1 refills | Status: DC

## 2022-08-25 MED ORDER — FLUTICASONE PROPIONATE 50 MCG/ACT NA SUSP
1.0000 | Freq: Every day | NASAL | 5 refills | Status: AC
Start: 2022-08-25 — End: ?

## 2022-08-25 MED ORDER — ACETAMINOPHEN 500 MG PO TABS
ORAL_TABLET | ORAL | 5 refills | Status: AC
Start: 2022-08-25 — End: ?

## 2022-08-25 MED ORDER — DOCUSATE SODIUM 100 MG PO CAPS
ORAL_CAPSULE | ORAL | 1 refills | Status: AC
Start: 2022-08-25 — End: ?

## 2022-08-25 MED ORDER — TRAMADOL HCL 50 MG PO TABS
ORAL_TABLET | ORAL | 3 refills | Status: DC
Start: 2022-08-25 — End: 2022-11-25

## 2022-08-25 NOTE — Progress Notes (Signed)
[facility] [address] [place], [place] [address]  Internal MEDICINE  Office Visit Note  Patient Name: Teresa Crawford  [id number]  [id number]  Date of Service: 08/25/2022  Chief Complaint  Patient presents with   Depression   Follow-up    HPI Trey presents for a follow-up visit for refills, medication review Review medication list -- no longer taking hyoscyamine, hydroxyzine, meclizine, ibuprofen or dicyclomine. Need removed from med list. Refills. -- routine  Allergic rhinitis -- having a flare due to pollen allergy -- takes cetirizine and flonase.  No more ibuprofen due to risk of GI bleeding, can take tylenol instead and tramadol for moderate to severe pain     Current Medication: Outpatient Encounter Medications as of 08/25/2022  Medication Sig   albuterol (VENTOLIN HFA) 108 (90 Base) MCG/ACT inhaler Inhale 2 puffs into the lungs every 6 (six) hours as needed for wheezing or shortness of breath.   buPROPion (WELLBUTRIN) 75 MG tablet Take 1 tablet (75 mg total) by mouth daily with breakfast.   EPINEPHrine 0.3 mg/0.3 mL IJ SOAJ injection USE FOR ANAPHYLAXIS   fluticasone (FLOVENT HFA) 220 MCG/ACT inhaler INHALE 2 PUFFS INTO THE LUNGS, 2 TIMES PER DAY. RINSE MOUTH AFTER INHALATIONS   omeprazole (PRILOSEC) 40 MG capsule TAKE 1 CAPSULE BY MOUTH 2 TIMES DAILY   ondansetron (ZOFRAN-ODT) 4 MG disintegrating tablet Take 1 tablet (4 mg total) by mouth every 8 (eight) hours as needed for nausea or vomiting.   tamsulosin (FLOMAX) 0.4 MG CAPS capsule    topiramate (TOPAMAX) 50 MG tablet TAKE 1 TABLET BY MOUTH EVERY EVENING   traZODone (DESYREL) 50 MG tablet Take 0.5 tablets (25 mg total) by mouth at bedtime.   triamcinolone cream (KENALOG) 0.1 % Apply 1 Application topically 2 (two) times daily as needed (itching and rash).   [DISCONTINUED] acetaminophen (TYLENOL) 500 MG tablet 2 TABS PO BID PRN FOR HEADACHES AND PAINS   [DISCONTINUED] cetirizine (ZYRTEC ALLERGY) 10 MG  tablet Take 1 tablet (10 mg total) by mouth daily.   [DISCONTINUED] docusate sodium (COLACE) 100 MG capsule TAKE 1 CAPSULE BY MOUTH ONCE DAILY FOR STOOL SOFTENER   [DISCONTINUED] fluticasone (FLONASE) 50 MCG/ACT nasal spray Place 1 spray into both nostrils daily.   [DISCONTINUED] hydrOXYzine (ATARAX) 25 MG tablet Take 25 mg by mouth 3 (three) times daily.   [DISCONTINUED] hyoscyamine (ANASPAZ) 0.125 MG TBDP disintergrating tablet Place 1 tablet (0.125 mg total) under the tongue every 4 (four) hours as needed.   [DISCONTINUED] ibuprofen (ADVIL) 600 MG tablet ONE TAB PO BID PRN FOR PAIN AND HEADACHES   [DISCONTINUED] meclizine (ANTIVERT) 12.5 MG tablet One tab po tid prn for dizziness   [DISCONTINUED] Na Sulfate-K Sulfate-Mg Sulf 17.5-3.13-1.6 GM/177ML SOLN Take by mouth.   [DISCONTINUED] traMADol (ULTRAM) 50 MG tablet ONE TAB PO BID PRN FOR PAIN AND HEADCAHES   acetaminophen (TYLENOL) 500 MG tablet 2 TABS PO BID PRN FOR HEADACHES AND PAINS   cetirizine (ZYRTEC ALLERGY) 10 MG tablet Take 1 tablet (10 mg total) by mouth daily.   docusate sodium (COLACE) 100 MG capsule TAKE 1 CAPSULE BY MOUTH ONCE DAILY FOR STOOL SOFTENER   fluticasone (FLONASE) 50 MCG/ACT nasal spray Place 1 spray into both nostrils daily.   traMADol (ULTRAM) 50 MG tablet ONE TAB PO BID PRN FOR PAIN AND HEADCAHES   No facility-administered encounter medications on file as of 08/25/2022.    Surgical History: Past Surgical History:  Procedure Laterality Date   ABDOMINAL HYSTERECTOMY  BACK SURGERY     COLONOSCOPY  04/27/2006   Wohl-colitis, internal hemorrhoids   COLONOSCOPY  02/2013   focal right colitis, left colon biopsy negative, focal active proctitis without chronicity   COLONOSCOPY WITH PROPOFOL N/A 07/16/2022   Procedure: COLONOSCOPY WITH PROPOFOL;  Surgeon: Toney Reil, MD;  Location: [facility] ENDOSCOPY;  Service: Gastroenterology;  Laterality: N/A;   ESOPHAGOGASTRODUODENOSCOPY  02/2013   multiple peptic &  duodenal ulcers, negative h pylori   INTRAUTERINE DEVICE INSERTION  2012   LAPAROSCOPIC ENDOMETRIOSIS FULGURATION  2015   [name]   OVARIAN CYST REMOVAL      Medical History: Past Medical History:  Diagnosis Date   B12 deficiency anemia    Chronic back pain    Chronic fatigue syndrome    Constipation    Depression    Elevated liver enzymes    Endometriosis    Fibroids    Hepatic steatosis    Hypothyroid    Last menstrual period (LMP) > 10 days ago 09/12/15   Mental retardation    Migraine    Post traumatic stress disorder    Posttraumatic stress disorder    PUD (peptic ulcer disease)    Scoliosis    Splenomegaly    Uterine fibroid     Family History: Family History  Problem Relation Age of Onset   Breast cancer Mother 63   COPD Father    Lung cancer Father 72   Dementia Father    Diabetes Sister    Diabetes Brother    Ovarian cancer Paternal Aunt    Colon cancer Maternal Grandfather 78   Liver disease Maternal Aunt        ?etiology   Lymphoma Maternal Grandmother 2   Lung cancer Other 67       maternal great grandfather   Stomach cancer Other 19       maternal great grandmother    Social History   Socioeconomic History   Marital status: Single    Spouse name: Not on file   Number of children: 0   Years of education: Not on file   Highest education level: Not on file  Occupational History   Not on file  Tobacco Use   Smoking status: Never   Smokeless tobacco: Never  Vaping Use   Vaping Use: Never used  Substance and Sexual Activity   Alcohol use: Not Currently    Alcohol/week: 0.0 standard drinks of alcohol   Drug use: No   Sexual activity: Not Currently  Other Topics Concern   Not on file  Social History Narrative   She has brother, half brother & sister live nearby   Both parents deceased   Care managed by Anselm Pancoast   Social Determinants of Health   Financial Resource Strain: Low Risk  (02/11/2017)   Overall Financial Resource Strain  (CARDIA)    Difficulty of Paying Living Expenses: Not hard at all  Food Insecurity: No Food Insecurity (02/11/2017)   Hunger Vital Sign    Worried About Running Out of Food in the Last Year: Never true    Ran Out of Food in the Last Year: Never true  Transportation Needs: No Transportation Needs (02/11/2017)   PRAPARE - Administrator, Civil Service (Medical): No    Lack of Transportation (Non-Medical): No  Physical Activity: Inactive (02/11/2017)   Exercise Vital Sign    Days of Exercise per Week: 0 days    Minutes of Exercise per Session: 0 min  Stress:  No Stress Concern Present (02/11/2017)   [employer] of Occupational Health - Occupational Stress Questionnaire    Feeling of Stress : Not at all  Social Connections: Unknown (02/11/2017)   Social Connection and Isolation Panel [NHANES]    Frequency of Communication with Friends and Family: Patient declined    Frequency of Social Gatherings with Friends and Family: Patient declined    Attends Religious Services: Patient declined    Database administrator or Organizations: Patient declined    Attends Banker Meetings: Patient declined    Marital Status: Patient declined  Intimate Partner Violence: Unknown (02/11/2017)   Humiliation, Afraid, Rape, and Kick questionnaire    Fear of Current or Ex-Partner: Patient declined    Emotionally Abused: Patient declined    Physically Abused: Patient declined    Sexually Abused: Patient declined      Review of Systems  Constitutional:  Negative for activity change, appetite change, chills, fatigue, fever and unexpected weight change.  HENT:  Positive for congestion, postnasal drip, rhinorrhea and sneezing. Negative for ear pain, sore throat and trouble swallowing.   Eyes: Negative.   Respiratory: Negative.  Negative for cough, chest tightness, shortness of breath and wheezing.   Cardiovascular: Negative.  Negative for chest pain.  Gastrointestinal: Negative.   Negative for abdominal pain, blood in stool, constipation, diarrhea, nausea and vomiting.  Endocrine: Negative.   Genitourinary: Negative.  Negative for difficulty urinating, dysuria, frequency, hematuria and urgency.  Musculoskeletal: Negative.  Negative for arthralgias, back pain, joint swelling, myalgias and neck pain.  Skin: Negative.  Negative for rash and wound.  Allergic/Immunologic: Negative.  Negative for immunocompromised state.  Neurological: Negative.  Negative for dizziness, seizures, numbness and headaches.  Hematological: Negative.   Psychiatric/Behavioral:  Negative for behavioral problems, self-injury and suicidal ideas. The patient is not nervous/anxious.     Vital Signs: BP 130/74   Pulse 78   Temp 98.3 F (36.8 C)   Resp 16   Ht 5\' 3"  (1.6 m)   Wt 191 lb 3.2 oz (86.7 kg)   LMP 12/06/2015   SpO2 97%   BMI 33.87 kg/m    Physical Exam Vitals reviewed.  Constitutional:      General: She is not in acute distress.    Appearance: Normal appearance. She is obese. She is not ill-appearing.  HENT:     Head: Normocephalic and atraumatic.     Nose: Congestion and rhinorrhea present.     Mouth/Throat:     Pharynx: Posterior oropharyngeal erythema present.  Eyes:     Pupils: Pupils are equal, round, and reactive to light.  Cardiovascular:     Rate and Rhythm: Normal rate and regular rhythm.  Pulmonary:     Effort: Pulmonary effort is normal. No respiratory distress.  Neurological:     Mental Status: She is alert.  Psychiatric:        Mood and Affect: Mood normal.        Behavior: Behavior normal.        Assessment/Plan: 1. Seasonal allergic rhinitis due to pollen Continue flonase nasal spray and cetirizine as prescribed. - cetirizine (ZYRTEC ALLERGY) 10 MG tablet; Take 1 tablet (10 mg total) by mouth daily.  Dispense: 90 tablet; Refill: 1 - fluticasone (FLONASE) 50 MCG/ACT nasal spray; Place 1 spray into both nostrils daily.  Dispense: 16 g; Refill:  5  2. Mild intermittent asthma without complication Stable, continue medications as prescribed.   3. Encounter for medication review Medication list reviewed with  patient, updated and refills ordered. - docusate sodium (COLACE) 100 MG capsule; TAKE 1 CAPSULE BY MOUTH ONCE DAILY FOR STOOL SOFTENER  Dispense: 90 capsule; Refill: 1 - traMADol (ULTRAM) 50 MG tablet; ONE TAB PO BID PRN FOR PAIN AND HEADCAHES  Dispense: 20 tablet; Refill: 3 - acetaminophen (TYLENOL) 500 MG tablet; 2 TABS PO BID PRN FOR HEADACHES AND PAINS  Dispense: 45 tablet; Refill: 5   General Counseling: Amyla verbalizes understanding of the findings of todays visit and agrees with plan of treatment. I have discussed any further diagnostic evaluation that may be needed or ordered today. We also reviewed her medications today. she has been encouraged to call the office with any questions or concerns that should arise related to todays visit.    No orders of the defined types were placed in this encounter.   Meds ordered this encounter  Medications   cetirizine (ZYRTEC ALLERGY) 10 MG tablet    Sig: Take 1 tablet (10 mg total) by mouth daily.    Dispense:  90 tablet    Refill:  1   docusate sodium (COLACE) 100 MG capsule    Sig: TAKE 1 CAPSULE BY MOUTH ONCE DAILY FOR STOOL SOFTENER    Dispense:  90 capsule    Refill:  1    FOR FUTURE USE THANKS   fluticasone (FLONASE) 50 MCG/ACT nasal spray    Sig: Place 1 spray into both nostrils daily.    Dispense:  16 g    Refill:  5   traMADol (ULTRAM) 50 MG tablet    Sig: ONE TAB PO BID PRN FOR PAIN AND HEADCAHES    Dispense:  20 tablet    Refill:  3   acetaminophen (TYLENOL) 500 MG tablet    Sig: 2 TABS PO BID PRN FOR HEADACHES AND PAINS    Dispense:  45 tablet    Refill:  5    Return for previously scheduled, CPE, Jameon Deller PCP in october.   Total time spent:30 Minutes Time spent includes review of chart, medications, test results, and follow up plan with the patient.    [place] Controlled Substance Database was reviewed by me.  This patient was seen by Sallyanne Kuster, FNP-C in collaboration with Dr. Beverely Risen as a part of collaborative care agreement.   Valicia Rief R. Tedd Sias, MSN, FNP-C Internal medicine

## 2022-09-01 ENCOUNTER — Ambulatory Visit (INDEPENDENT_AMBULATORY_CARE_PROVIDER_SITE_OTHER): Payer: Medicare Other | Admitting: Licensed Clinical Social Worker

## 2022-09-01 DIAGNOSIS — F4310 Post-traumatic stress disorder, unspecified: Secondary | ICD-10-CM | POA: Diagnosis not present

## 2022-09-01 NOTE — Progress Notes (Signed)
Virtual Visit via Video Note  I connected with Teresa Crawford on 09/01/22 at  3:00 PM EDT by a video enabled telemedicine application and verified that I am speaking with the correct person using two identifiers.  Location: Patient: home Provider:  remote office [place], [place])   I discussed the limitations of evaluation and management by telemedicine and the availability of in person appointments. The patient expressed understanding and agreed to proceed.  I discussed the assessment and treatment plan with the patient. The patient was provided an opportunity to ask questions and all were answered. The patient agreed with the plan and demonstrated an understanding of the instructions.   The patient was advised to call back or seek an in-person evaluation if the symptoms worsen or if the condition fails to improve as anticipated.  I provided 48 minutes of non-face-to-face time during this encounter.   Aleiah Mohammed R Akia Montalban, LCSW   THERAPIST PROGRESS NOTE  Session Time: 3-348p  Participation Level: Active  Behavioral Response: NeatAlertAnxious and Depressed  Type of Therapy: Individual Therapy  ProgressTowards Goals: Progressing  Treatment Goals addressed: Develop healthy thinking patterns and beliefs about self, others, and the world that lead to the alleviation and help prevent the relapse of depression per self report 3 out of 5 sessions documented   Reduce the negative impact trauma related symptoms have on social, occupational, and family functioning per pt self report 3 out of 5 sessions documented.   Interventions:  CBT, trauma focused; grief counseling   Summary: Teresa Crawford is a 37 y.o. female who presents with continuing symptoms related to PTSD diagnosis. Pt reports overall mood is stable and that she is managing situational stressors well.   Allowed pt to explore current relationship concerns--pt states that she is very happy with her current relationship  compared to her previous relationship. Discussed pt point of view, partner's point of view, and discussed areas of compromise/understanding. Explored current communication skills that both parties are using--pt feels that they communicate well since they were best friends prior to being romantic.    Pt is excited about graduation invitation for her nephew.   Assisted pt with identifying anxiety triggers--pt is concerned about recent declining health of her dog. .Reviewed anxiety and stress management strategies.    Allowed pt to explore thoughts and feelings associated with loss of her friend, Shakyra, in the past.. Discussed pts awareness of how loss has impacted overall life. Clinician provided empathy and support while giving patient safe space to explore grief.   Continued recommendations are as follows: self care behaviors, positive social engagements, focusing on overall work/home/life balance, and focusing on positive physical and emotional wellness.   Suicidal/Homicidal: No  Therapist Response: Pt is continuing to apply interventions learned in session into daily life situations. Pt is currently on track to meet goals utilizing interventions mentioned above. Personal growth and progress noted. Treatment to continue as indicated.   Plan: Return again in 4 weeks.  Encounter Diagnosis  Name Primary?   Posttraumatic stress disorder Yes    Collaboration of Care: Other pt encouraged to continue care with psychiatrist of record, Dr. Elna Breslow  Patient/Guardian was advised Release of Information must be obtained prior to any record release in order to collaborate their care with an outside provider. Patient/Guardian was advised if they have not already done so to contact the registration department to sign all necessary forms in order for [place] to release information regarding their care.   Consent: Patient/Guardian gives verbal consent for  treatment and assignment of benefits for services provided  during this visit. Patient/Guardian expressed understanding and agreed to proceed.    Ernest Haber Devanie Galanti, LCSW 09/01/2022

## 2022-09-17 ENCOUNTER — Telehealth (INDEPENDENT_AMBULATORY_CARE_PROVIDER_SITE_OTHER): Payer: Medicare Other | Admitting: Physician Assistant

## 2022-09-17 ENCOUNTER — Encounter: Payer: Self-pay | Admitting: Physician Assistant

## 2022-09-17 VITALS — Resp 16 | Ht 63.0 in

## 2022-09-17 DIAGNOSIS — J069 Acute upper respiratory infection, unspecified: Secondary | ICD-10-CM

## 2022-09-17 DIAGNOSIS — J4520 Mild intermittent asthma, uncomplicated: Secondary | ICD-10-CM | POA: Diagnosis not present

## 2022-09-17 MED ORDER — AZITHROMYCIN 250 MG PO TABS
ORAL_TABLET | ORAL | 0 refills | Status: AC
Start: 2022-09-17 — End: 2022-09-22

## 2022-09-17 NOTE — Progress Notes (Signed)
[facility] [address] [place], [place] [address]  Internal MEDICINE  Telephone Visit  Patient Name: Teresa Crawford  [id number]  [id number]  Date of Service: 09/17/2022  I connected with the patient at 11:52 by telephone and verified the patients identity using two identifiers.   I discussed the limitations, risks, security and privacy concerns of performing an evaluation and management service by telephone and the availability of in person appointments. I also discussed with the patient that there may be a patient responsible charge related to the service.  The patient expressed understanding and agrees to proceed.    Chief Complaint  Patient presents with   Telephone Screen    Headache, body aches, sore throat, can't taste anything. Covid test negative.  Coughing, cold chills    Telephone Assessment   Sore Throat   Headache    HPI Pt is here for virtual sick visit -headache started a few days ago, but Sunday is when most symptoms started including body aches, sore throat, lack of taste, cough, and chills  -covid test Tuesday was negative, but unsure if tested too soon and unable to retest -chills, but unsure if fever -eating and drinking plenty -Took nasal spray and mucinex -using albuterol inhaler some, a little wheezing -productive cough, clear -No sick contacts that she is aware of  Current Medication: Outpatient Encounter Medications as of 09/17/2022  Medication Sig   acetaminophen (TYLENOL) 500 MG tablet 2 TABS PO BID PRN FOR HEADACHES AND PAINS   albuterol (VENTOLIN HFA) 108 (90 Base) MCG/ACT inhaler Inhale 2 puffs into the lungs every 6 (six) hours as needed for wheezing or shortness of breath.   azithromycin (ZITHROMAX) 250 MG tablet Take 2 tablets on day 1, then 1 tablet daily on days 2 through 5   buPROPion (WELLBUTRIN) 75 MG tablet Take 1 tablet (75 mg total) by mouth daily with breakfast.   cetirizine (ZYRTEC ALLERGY) 10 MG tablet Take 1 tablet (10  mg total) by mouth daily.   docusate sodium (COLACE) 100 MG capsule TAKE 1 CAPSULE BY MOUTH ONCE DAILY FOR STOOL SOFTENER   EPINEPHrine 0.3 mg/0.3 mL IJ SOAJ injection USE FOR ANAPHYLAXIS   fluticasone (FLONASE) 50 MCG/ACT nasal spray Place 1 spray into both nostrils daily.   fluticasone (FLOVENT HFA) 220 MCG/ACT inhaler INHALE 2 PUFFS INTO THE LUNGS, 2 TIMES PER DAY. RINSE MOUTH AFTER INHALATIONS   omeprazole (PRILOSEC) 40 MG capsule TAKE 1 CAPSULE BY MOUTH 2 TIMES DAILY   ondansetron (ZOFRAN-ODT) 4 MG disintegrating tablet Take 1 tablet (4 mg total) by mouth every 8 (eight) hours as needed for nausea or vomiting.   tamsulosin (FLOMAX) 0.4 MG CAPS capsule    topiramate (TOPAMAX) 50 MG tablet TAKE 1 TABLET BY MOUTH EVERY EVENING   traMADol (ULTRAM) 50 MG tablet ONE TAB PO BID PRN FOR PAIN AND HEADCAHES   traZODone (DESYREL) 50 MG tablet Take 0.5 tablets (25 mg total) by mouth at bedtime.   triamcinolone cream (KENALOG) 0.1 % Apply 1 Application topically 2 (two) times daily as needed (itching and rash).   No facility-administered encounter medications on file as of 09/17/2022.    Surgical History: Past Surgical History:  Procedure Laterality Date   ABDOMINAL HYSTERECTOMY     BACK SURGERY     COLONOSCOPY  04/27/2006   Wohl-colitis, internal hemorrhoids   COLONOSCOPY  02/2013   focal right colitis, left colon biopsy negative, focal active proctitis without chronicity   COLONOSCOPY WITH PROPOFOL N/A 07/16/2022  Procedure: COLONOSCOPY WITH PROPOFOL;  Surgeon: Toney Reil, MD;  Location: [facility] ENDOSCOPY;  Service: Gastroenterology;  Laterality: N/A;   ESOPHAGOGASTRODUODENOSCOPY  02/2013   multiple peptic & duodenal ulcers, negative h pylori   INTRAUTERINE DEVICE INSERTION  2012   LAPAROSCOPIC ENDOMETRIOSIS FULGURATION  2015   [name]   OVARIAN CYST REMOVAL      Medical History: Past Medical History:  Diagnosis Date   B12 deficiency anemia    Chronic back pain    Chronic fatigue  syndrome    Constipation    Depression    Elevated liver enzymes    Endometriosis    Fibroids    Hepatic steatosis    Hypothyroid    Last menstrual period (LMP) > 10 days ago 09/12/15   Mental retardation    Migraine    Post traumatic stress disorder    Posttraumatic stress disorder    PUD (peptic ulcer disease)    Scoliosis    Splenomegaly    Uterine fibroid     Family History: Family History  Problem Relation Age of Onset   Breast cancer Mother 23   COPD Father    Lung cancer Father 54   Dementia Father    Diabetes Sister    Diabetes Brother    Ovarian cancer Paternal Aunt    Colon cancer Maternal Grandfather 51   Liver disease Maternal Aunt        ?etiology   Lymphoma Maternal Grandmother 47   Lung cancer Other 79       maternal great grandfather   Stomach cancer Other 48       maternal great grandmother    Social History   Socioeconomic History   Marital status: Single    Spouse name: Not on file   Number of children: 0   Years of education: Not on file   Highest education level: Not on file  Occupational History   Not on file  Tobacco Use   Smoking status: Never   Smokeless tobacco: Never  Vaping Use   Vaping Use: Never used  Substance and Sexual Activity   Alcohol use: Not Currently    Alcohol/week: 0.0 standard drinks of alcohol   Drug use: No   Sexual activity: Not Currently  Other Topics Concern   Not on file  Social History Narrative   She has brother, half brother & sister live nearby   Both parents deceased   Care managed by Anselm Pancoast   Social Determinants of Health   Financial Resource Strain: Low Risk  (02/11/2017)   Overall Financial Resource Strain (CARDIA)    Difficulty of Paying Living Expenses: Not hard at all  Food Insecurity: No Food Insecurity (02/11/2017)   Hunger Vital Sign    Worried About Running Out of Food in the Last Year: Never true    Ran Out of Food in the Last Year: Never true  Transportation Needs: No  Transportation Needs (02/11/2017)   PRAPARE - Administrator, Civil Service (Medical): No    Lack of Transportation (Non-Medical): No  Physical Activity: Inactive (02/11/2017)   Exercise Vital Sign    Days of Exercise per Week: 0 days    Minutes of Exercise per Session: 0 min  Stress: No Stress Concern Present (02/11/2017)   [employer] of Occupational Health - Occupational Stress Questionnaire    Feeling of Stress : Not at all  Social Connections: Unknown (02/11/2017)   Social Connection and Isolation Panel [NHANES]  Frequency of Communication with Friends and Family: Patient declined    Frequency of Social Gatherings with Friends and Family: Patient declined    Attends Religious Services: Patient declined    Database administrator or Organizations: Patient declined    Attends Banker Meetings: Patient declined    Marital Status: Patient declined  Intimate Partner Violence: Unknown (02/11/2017)   Humiliation, Afraid, Rape, and Kick questionnaire    Fear of Current or Ex-Partner: Patient declined    Emotionally Abused: Patient declined    Physically Abused: Patient declined    Sexually Abused: Patient declined      Review of Systems  Constitutional:  Positive for chills and fatigue. Negative for fever.  HENT:  Positive for sore throat. Negative for congestion, mouth sores and postnasal drip.   Respiratory:  Positive for cough. Negative for wheezing.   Cardiovascular:  Negative for chest pain.  Genitourinary:  Negative for flank pain.  Musculoskeletal:  Positive for myalgias.  Neurological:  Positive for headaches.  Psychiatric/Behavioral: Negative.      Vital Signs: Resp 16   Ht 5\' 3"  (1.6 m)   LMP 12/06/2015   BMI 33.87 kg/m    Observation/Objective:  Pt is able to carry out conversation   Assessment/Plan: 1. Upper respiratory tract infection, unspecified type Discussed likely viral infection, however given symptoms and underlying  asthma will go ahead and treat with course of azithromycin and continue mucinex, nasal spray, and tylenol prn. Advised to rest and drink plenty of fluids - azithromycin (ZITHROMAX) 250 MG tablet; Take 2 tablets on day 1, then 1 tablet daily on days 2 through 5  Dispense: 6 tablet; Refill: 0  2. Mild intermittent asthma without complication Continue inhaler as prescribed   General Counseling: josetta feig understanding of the findings of today's phone visit and agrees with plan of treatment. I have discussed any further diagnostic evaluation that may be needed or ordered today. We also reviewed her medications today. she has been encouraged to call the office with any questions or concerns that should arise related to todays visit.    No orders of the defined types were placed in this encounter.   Meds ordered this encounter  Medications   azithromycin (ZITHROMAX) 250 MG tablet    Sig: Take 2 tablets on day 1, then 1 tablet daily on days 2 through 5    Dispense:  6 tablet    Refill:  0    Time spent:25 Minutes    Dr Lyndon Code Internal medicine

## 2022-09-29 ENCOUNTER — Ambulatory Visit (INDEPENDENT_AMBULATORY_CARE_PROVIDER_SITE_OTHER): Payer: Medicare Other | Admitting: Licensed Clinical Social Worker

## 2022-09-29 DIAGNOSIS — F4310 Post-traumatic stress disorder, unspecified: Secondary | ICD-10-CM

## 2022-09-29 NOTE — Patient Instructions (Signed)
Outpatient Psychiatry and Counseling  FOR CRISIS:  call 911, [facility]: Mobile Crisis Management 24 hours:  [phone], call [phone], [facility] ([facility]) [address] walk in, or go to your local EMERGENCY DEPARTMENT  [facility] sliding scale fee and walk in schedule: M-F 8am-12pm/1pm-3pm [address]  [place], [place] [address] [phone]  [facility] [address] [place], [place] [address] [phone]  [facility]/ Intensive Outpatient Therapy Program/CDIOP/PHP [address] [place], [place] [address] [phone]  [facility]                  Crisis Services, Outpatient Therapy Services, Walk in Services      [phone]     [address]    [place], [place] [address]                 [facility]   [facility] [phone] [address] [place], [place] [address]  [facility]          [address],  [place], [place] [address]       [phone]  [facility] [address] [place], [place] [address] [phone]  [facility]    [address]    [place], [place] [address]     [phone]       [facility] [address] [place] [address]  [facility]     [address]     [place], [place]      [phone]       [facility] Shamsher Ahluwalia, MD [address] [place],  [address] [phone]  [facility]     [address]     [place],  [address]     [phone]       [facility] [address] ,  [address] [phone] Resources for Temporary Residential Assistance/Crisis Centers  

## 2022-09-29 NOTE — Progress Notes (Signed)
Virtual Visit via Video Note  I connected with Teresa Crawford on 09/29/22 at 11:00 AM EDT by a video enabled telemedicine application and verified that I am speaking with the correct person using two identifiers.  Location: Patient: home Provider:  remote office [place], [place])   I discussed the limitations of evaluation and management by telemedicine and the availability of in person appointments. The patient expressed understanding and agreed to proceed.  I discussed the assessment and treatment plan with the patient. The patient was provided an opportunity to ask questions and all were answered. The patient agreed with the plan and demonstrated an understanding of the instructions.   The patient was advised to call back or seek an in-person evaluation if the symptoms worsen or if the condition fails to improve as anticipated.  I provided 60 minutes of non-face-to-face time during this encounter.   Ashlee Bewley R Tyla Burgner, LCSW   THERAPIST PROGRESS NOTE  Session Time: 11a-12p  Participation Level: Active  Behavioral Response: NeatAlertAnxious and Depressed  Type of Therapy: Individual Therapy  ProgressTowards Goals: Progressing  Treatment Goals addressed: Develop healthy thinking patterns and beliefs about self, others, and the world that lead to the alleviation and help prevent the relapse of depression per self report 3 out of 5 sessions documented   Reduce the negative impact trauma related symptoms have on social, occupational, and family functioning per pt self report 3 out of 5 sessions documented.   Interventions:  CBT, trauma focused; grief counseling   Summary: Teresa Crawford is a 37 y.o. female who presents with continuing symptoms related to PTSD diagnosis.   Clinician assisted pt with identifying situations/scenarios/schemas triggering anxiety and/or depression symptoms. Allowed pt to explore and express thoughts and feelings and discussed current coping  mechanisms. Reviewed changes/recommendations. Pt reports that she recently spoke with her ex boyfriend [name] and it has stirred up some feelings. Allowed pt to explore and explain the thoughts and feelings.   Pt reports that she has decided that she wants to write a journal about her lifespan.  Pt states that she feels it would be very healing for her. Pt states that she is embracing her spirituality more recently.  Allowed pt to explore current relationship concerns. Pt is having some feelings about ex boyfriend versus current boyfriend. "I'm still happy but just wondering if things could have been different".    Continued recommendations are as follows: self care behaviors, positive social engagements, focusing on overall work/home/life balance, and focusing on positive physical and emotional wellness.   Suicidal/Homicidal: No  Therapist Response: Pt is continuing to apply interventions learned in session into daily life situations. Pt is currently on track to meet goals utilizing interventions mentioned above. Personal growth and progress noted. Treatment to continue as indicated.   Plan: Informed patient that clinician will be leaving outpatient department. Allowed pt to explore any questions or concerns and discussed future counseling options/resources. Provided pt with psychoeducational resources and list of OPT therapists. Encouraged pt to continue with psychiatric med management appointments, if applicable.    Encounter Diagnosis  Name Primary?   Posttraumatic stress disorder Yes   Collaboration of Care: Other pt encouraged to continue care with psychiatrist of record, Dr. Elna Breslow  Patient/Guardian was advised Release of Information must be obtained prior to any record release in order to collaborate their care with an outside provider. Patient/Guardian was advised if they have not already done so to contact the registration department to sign all necessary forms in order for  [place] to  release information regarding their care.   Consent: Patient/Guardian gives verbal consent for treatment and assignment of benefits for services provided during this visit. Patient/Guardian expressed understanding and agreed to proceed.    Ernest Haber Makynna Manocchio, LCSW 09/29/2022

## 2022-10-20 ENCOUNTER — Encounter: Payer: Self-pay | Admitting: Psychiatry

## 2022-10-20 ENCOUNTER — Telehealth: Payer: Medicare Other | Admitting: Psychiatry

## 2022-10-20 DIAGNOSIS — F4310 Post-traumatic stress disorder, unspecified: Secondary | ICD-10-CM

## 2022-10-20 DIAGNOSIS — F5105 Insomnia due to other mental disorder: Secondary | ICD-10-CM | POA: Diagnosis not present

## 2022-10-20 DIAGNOSIS — F3342 Major depressive disorder, recurrent, in full remission: Secondary | ICD-10-CM | POA: Diagnosis not present

## 2022-10-20 MED ORDER — BUPROPION HCL 75 MG PO TABS: 75.0000 mg | ORAL_TABLET | Freq: Every day | ORAL | 1 refills | Status: DC

## 2022-10-20 MED ORDER — TRAZODONE HCL 50 MG PO TABS
25.0000 mg | ORAL_TABLET | Freq: Every day | ORAL | 1 refills | Status: DC
Start: 2022-10-20 — End: 2023-02-24

## 2022-10-20 NOTE — Progress Notes (Unsigned)
Virtual Visit via Video Note  I connected with Teresa Crawford on 10/20/22 at 11:40 AM EDT by a video enabled telemedicine application and verified that I am speaking with the correct person using two identifiers.  Location Provider Location : [facility] Patient Location : Home  Participants: Patient , Provider    I discussed the limitations of evaluation and management by telemedicine and the availability of in person appointments. The patient expressed understanding and agreed to proceed.   I discussed the assessment and treatment plan with the patient. The patient was provided an opportunity to ask questions and all were answered. The patient agreed with the plan and demonstrated an understanding of the instructions.   The patient was advised to call back or seek an in-person evaluation if the symptoms worsen or if the condition fails to improve as anticipated.    BH MD OP Progress Note  10/21/2022 7:55 PM Teresa Crawford  MRN:  [id number]  Chief Complaint:  Chief Complaint  Patient presents with   Follow-up   Depression   Medication Refill   HPI: Teresa Crawford is a 37 year old Caucasian female who is single, lives in [place], has a history of MDD, PTSD, insomnia was evaluated by telemedicine today.  Patient today reports she is currently doing well with regards to her mood.  She had concerns about GI problems at her last visit however reports she currently feels fine.  She reports she is eating better although she continues to avoid meat in her diet.  That seems to help her to feel better.  Patient reports sleep is overall good.  She continues to stay busy at work and is currently working 2 days a week.  She reports she feels tired at the end of the day and that does help with her sleep.  She also has trazodone available.  Denies side effects.  Patient is compliant on the Wellbutrin 75 mg.  Denies side effects.  Denies any suicidality, homicidality or perceptual  disturbances.  Reports she has a boyfriend right now, they have been seeing each other since the past couple of months.  That is going well.  She also was able to spent some time with her sister and she was able to celebrate her birthday with her sister which went well.  Patient denies any other concerns today.  Visit Diagnosis:    ICD-10-CM   1. MDD (major depressive disorder), recurrent, in full remission (HCC)  F33.42 buPROPion (WELLBUTRIN) 75 MG tablet    2. Posttraumatic stress disorder  F43.10     3. Insomnia due to mental condition  F51.05 traZODone (DESYREL) 50 MG tablet   depression      Past Psychiatric History: I have reviewed past psychiatric history from progress note on 08/16/2018.  Past Medical History:  Past Medical History:  Diagnosis Date   B12 deficiency anemia    Chronic back pain    Chronic fatigue syndrome    Constipation    Depression    Elevated liver enzymes    Endometriosis    Fibroids    Hepatic steatosis    Hypothyroid    Last menstrual period (LMP) > 10 days ago 09/12/15   Mental retardation    Migraine    Post traumatic stress disorder    Posttraumatic stress disorder    PUD (peptic ulcer disease)    Scoliosis    Splenomegaly    Uterine fibroid     Past Surgical History:  Procedure Laterality Date  ABDOMINAL HYSTERECTOMY     BACK SURGERY     COLONOSCOPY  04/27/2006   Wohl-colitis, internal hemorrhoids   COLONOSCOPY  02/2013   focal right colitis, left colon biopsy negative, focal active proctitis without chronicity   COLONOSCOPY WITH PROPOFOL N/A 07/16/2022   Procedure: COLONOSCOPY WITH PROPOFOL;  Surgeon: Toney Reil, MD;  Location: [facility] ENDOSCOPY;  Service: Gastroenterology;  Laterality: N/A;   ESOPHAGOGASTRODUODENOSCOPY  02/2013   multiple peptic & duodenal ulcers, negative h pylori   INTRAUTERINE DEVICE INSERTION  2012   LAPAROSCOPIC ENDOMETRIOSIS FULGURATION  2015   [name]   OVARIAN CYST REMOVAL      Family Psychiatric  History: I have reviewed family psychiatric history from my progress note on 08/16/2018.   Family History:  Family History  Problem Relation Age of Onset   Breast cancer Mother 8   COPD Father    Lung cancer Father 33   Dementia Father    Diabetes Sister    Diabetes Brother    Ovarian cancer Paternal Aunt    Colon cancer Maternal Grandfather 22   Liver disease Maternal Aunt        ?etiology   Lymphoma Maternal Grandmother 52   Lung cancer Other 89       maternal great grandfather   Stomach cancer Other 59       maternal great grandmother    Social History: I have reviewed social history from my progress note on 08/16/2018. Social History   Socioeconomic History   Marital status: Single    Spouse name: Not on file   Number of children: 0   Years of education: Not on file   Highest education level: Not on file  Occupational History   Not on file  Tobacco Use   Smoking status: Never   Smokeless tobacco: Never  Vaping Use   Vaping status: Never Used  Substance and Sexual Activity   Alcohol use: Not Currently    Alcohol/week: 0.0 standard drinks of alcohol   Drug use: No   Sexual activity: Not Currently  Other Topics Concern   Not on file  Social History Narrative   She has brother, half brother & sister live nearby   Both parents deceased   Care managed by Anselm Pancoast   Social Determinants of Health   Financial Resource Strain: Low Risk  (02/11/2017)   Overall Financial Resource Strain (CARDIA)    Difficulty of Paying Living Expenses: Not hard at all  Food Insecurity: No Food Insecurity (02/11/2017)   Hunger Vital Sign    Worried About Running Out of Food in the Last Year: Never true    Ran Out of Food in the Last Year: Never true  Transportation Needs: No Transportation Needs (02/11/2017)   PRAPARE - Administrator, Civil Service (Medical): No    Lack of Transportation (Non-Medical): No  Physical Activity: Inactive (02/11/2017)   Exercise Vital Sign     Days of Exercise per Week: 0 days    Minutes of Exercise per Session: 0 min  Stress: No Stress Concern Present (02/11/2017)   [employer] of Occupational Health - Occupational Stress Questionnaire    Feeling of Stress : Not at all  Social Connections: Unknown (02/11/2017)   Social Connection and Isolation Panel [NHANES]    Frequency of Communication with Friends and Family: Patient declined    Frequency of Social Gatherings with Friends and Family: Patient declined    Attends Religious Services: Patient declined    Active  Member of Clubs or Organizations: Patient declined    Attends Banker Meetings: Patient declined    Marital Status: Patient declined    Allergies:  Allergies  Allergen Reactions   Other Anaphylaxis    Peanut butter, uses Epi pen   Peanut Butter Flavor Swelling    Throat swells Throat swells   Sertraline Other (See Comments)    Panic attacks   Codeine Nausea And Vomiting    nausea   Paroxetine Other (See Comments)    Panic Attack    Paroxetine Hcl Other (See Comments)    Panic attack Other reaction(s): Other (See Comments) Panic attacks   Prednisone & Diphenhydramine Rash   Venlafaxine Other (See Comments)    Panic Attack    Zoloft [Sertraline Hcl] Other (See Comments)    Panic attack    Metabolic Disorder Labs: Lab Results  Component Value Date   HGBA1C 5.5 12/20/2019   No results found for: "PROLACTIN" Lab Results  Component Value Date   CHOL 155 01/15/2022   TRIG 97 01/15/2022   HDL 34 (L) 01/15/2022   CHOLHDL 4.6 (H) 01/15/2022   LDLCALC 103 (H) 01/15/2022   LDLCALC 99 12/27/2020   Lab Results  Component Value Date   TSH 1.990 01/15/2022   TSH 1.750 12/27/2020    Therapeutic Level Labs: No results found for: "LITHIUM" No results found for: "VALPROATE" No results found for: "CBMZ"  Current Medications: Current Outpatient Medications  Medication Sig Dispense Refill   acetaminophen (TYLENOL) 500 MG tablet 2  TABS PO BID PRN FOR HEADACHES AND PAINS 45 tablet 5   albuterol (VENTOLIN HFA) 108 (90 Base) MCG/ACT inhaler Inhale 2 puffs into the lungs every 6 (six) hours as needed for wheezing or shortness of breath. 8 g 5   cetirizine (ZYRTEC ALLERGY) 10 MG tablet Take 1 tablet (10 mg total) by mouth daily. 90 tablet 1   docusate sodium (COLACE) 100 MG capsule TAKE 1 CAPSULE BY MOUTH ONCE DAILY FOR STOOL SOFTENER 90 capsule 1   EPINEPHrine 0.3 mg/0.3 mL IJ SOAJ injection USE FOR ANAPHYLAXIS 1 each 5   fluticasone (FLONASE) 50 MCG/ACT nasal spray Place 1 spray into both nostrils daily. 16 g 5   fluticasone (FLOVENT HFA) 220 MCG/ACT inhaler INHALE 2 PUFFS INTO THE LUNGS, 2 TIMES PER DAY. RINSE MOUTH AFTER INHALATIONS 12 g 11   omeprazole (PRILOSEC) 40 MG capsule TAKE 1 CAPSULE BY MOUTH 2 TIMES DAILY 60 capsule 5   ondansetron (ZOFRAN-ODT) 4 MG disintegrating tablet Take 1 tablet (4 mg total) by mouth every 8 (eight) hours as needed for nausea or vomiting. 20 tablet 0   tamsulosin (FLOMAX) 0.4 MG CAPS capsule      topiramate (TOPAMAX) 50 MG tablet TAKE 1 TABLET BY MOUTH EVERY EVENING 30 tablet 5   traMADol (ULTRAM) 50 MG tablet ONE TAB PO BID PRN FOR PAIN AND HEADCAHES 20 tablet 3   triamcinolone cream (KENALOG) 0.1 % Apply 1 Application topically 2 (two) times daily as needed (itching and rash). 453.6 g 1   buPROPion (WELLBUTRIN) 75 MG tablet Take 1 tablet (75 mg total) by mouth daily with breakfast. 90 tablet 1   traZODone (DESYREL) 50 MG tablet Take 0.5 tablets (25 mg total) by mouth at bedtime. 45 tablet 1   No current facility-administered medications for this visit.     Musculoskeletal: Strength & Muscle Tone:  UTA Gait & Station:  Seated Patient leans: N/A  Psychiatric Specialty Exam: Review of Systems  Psychiatric/Behavioral: Negative.  Last menstrual period 12/06/2015.There is no height or weight on file to calculate BMI.  General Appearance: Fairly Groomed  Eye Contact:  Fair   Speech:  Normal Rate  Volume:  Normal  Mood:  Euthymic  Affect:  Congruent  Thought Process:  Goal Directed and Descriptions of Associations: Intact  Orientation:  Full (Time, Place, and Person)  Thought Content: Logical   Suicidal Thoughts:  No  Homicidal Thoughts:  No  Memory:  Immediate;   Fair Recent;   Fair Remote;   Fair  Judgement:  Fair  Insight:  Fair  Psychomotor Activity:  Normal  Concentration:  Concentration: Fair and Attention Span: Fair  Recall:  [employer] of Knowledge: Fair  Language: Fair  Akathisia:  No  Handed:  Right  AIMS (if indicated): not done  Assets:  Communication Skills Desire for Improvement Housing Social Support  ADL's:  Intact  Cognition: WNL  Sleep:  Fair   Screenings: AIMS    Flowsheet Row Video Visit from 01/20/2022 in [facility] Video Visit from 10/22/2021 in [facility]  AIMS Total Score 0 0      GAD-7    Flowsheet Row Office Visit from 04/21/2022 in [facility] Video Visit from 01/20/2022 in [facility] Video Visit from 10/22/2021 in [facility] Office Visit from 04/08/2021 in [facility]  Total GAD-7 Score 0 1 0 0      Mini-Mental    Flowsheet Row Office Visit from 01/01/2022 in [facility], [facility] Clinical Support from 12/25/2020 in [facility], [facility] Clinical Support from 12/19/2019 in [facility], [facility] Office Visit from 10/13/2017 in [facility], [facility]  Total Score (max 30 points ) 29 29 30 27       PHQ2-9    Flowsheet Row Office Visit from 04/21/2022 in [facility] Video Visit from 01/20/2022 in [facility] Video Visit from 10/22/2021 in [facility] Counselor from 09/30/2021 in [facility] Office Visit from 09/03/2021 in [facility], [facility]  PHQ-2 Total Score 0 0 0 2 0  PHQ-9 Total Score 0 -- -- 11 --      Flowsheet Row Video Visit from 10/20/2022 in [facility] Counselor from 09/01/2022 in [facility] at [facility] from 07/29/2022 in [facility] at [name] RISK CATEGORY No Risk No Risk No Risk        Assessment and Plan: Teresa Crawford is a 37 year old Caucasian female with history of MDD, PTSD, insomnia, currently on SSD, works part-time was evaluated by telemedicine today.  Patient is currently stable.  Plan as noted below.  Plan MDD in remission Wellbutrin 75 mg p.o. daily. Trazodone 25 mg p.o. nightly.   PTSD - stable  Continue CBT , patient to establish care with a new therapist since her therapist is leaving. Continue trazodone 25 mg p.o. nightly  Insomnia-stable Trazodone 25 mg p.o. nightly.  Follow-up in clinic in 3 to 4 months or sooner in person.   Collaboration of Care: Collaboration of Care: Referral or follow-up with counselor/therapist AEB patient and needs to establish care with therapist.  I have reviewed notes per therapist dated 09/29/2022-Ms. Christina Hussami -patient was provided with psychoeducational resources and list of  therapist as per notes.  Patient advised to follow recommendations.  Patient/Guardian was advised Release of Information must be obtained prior to any record release in order to collaborate their care with an outside provider. Patient/Guardian was advised if they have not already done so to contact the registration department to sign all necessary forms in order for [place] to release information regarding their care.   Consent: Patient/Guardian gives verbal consent for treatment and assignment  of benefits for services provided during this visit. Patient/Guardian expressed understanding and agreed to proceed.   I have spent atleast 30 minutes face to face with patient today which includes the time spent for preparing to see the patient ( e.g., review of records ), obtaining and to review and separately obtained history , ordering medications ,psychoeducation and supportive psychotherapy and care coordination,as well as documenting clinical information in electronic health record.  This note was generated in part or whole with voice recognition software. Voice recognition is usually quite accurate but there are transcription errors that can and very often do occur. I apologize for any typographical errors that were not detected and corrected.     Jomarie Longs, MD 10/21/2022, 7:58 PM

## 2022-10-27 ENCOUNTER — Telehealth (INDEPENDENT_AMBULATORY_CARE_PROVIDER_SITE_OTHER): Payer: Medicare Other | Admitting: Internal Medicine

## 2022-10-27 ENCOUNTER — Encounter: Payer: Self-pay | Admitting: Internal Medicine

## 2022-10-27 VITALS — Resp 16 | Ht 63.0 in

## 2022-10-27 DIAGNOSIS — J029 Acute pharyngitis, unspecified: Secondary | ICD-10-CM

## 2022-10-27 DIAGNOSIS — R6889 Other general symptoms and signs: Secondary | ICD-10-CM | POA: Diagnosis not present

## 2022-10-27 MED ORDER — AZITHROMYCIN 250 MG PO TABS
ORAL_TABLET | ORAL | 0 refills | Status: AC
Start: 2022-10-27 — End: 2022-11-01

## 2022-10-27 MED ORDER — OSELTAMIVIR PHOSPHATE 75 MG PO CAPS
75.0000 mg | ORAL_CAPSULE | Freq: Two times a day (BID) | ORAL | 0 refills | Status: DC
Start: 2022-10-27 — End: 2022-11-25

## 2022-10-27 NOTE — Progress Notes (Signed)
[facility] [address] [place], [place] [address]  Internal MEDICINE  Telephone Visit  Patient Name: Teresa Crawford  [id number]  [id number]  Date of Service: 10/27/2022  I connected with the patient at 1140 am by telephone and verified the patients identity using two identifiers.   I discussed the limitations, risks, security and privacy concerns of performing an evaluation and management service by telephone and the availability of in person appointments. I also discussed with the patient that there may be a patient responsible charge related to the service.  The patient expressed understanding and agrees to proceed.    Chief Complaint  Patient presents with   Telephone Screen    Flu like symptoms, body ache, weak, tired, throat hurt, taste is gone, vomiting covid test was negative.  [phone]   Telephone Assessment    HPI  Pt is connected for sick visit  Pt is c/o body aches and sore throat  Taste is affected also Covid is negative    Current Medication: Outpatient Encounter Medications as of 10/27/2022  Medication Sig   acetaminophen (TYLENOL) 500 MG tablet 2 TABS PO BID PRN FOR HEADACHES AND PAINS   albuterol (VENTOLIN HFA) 108 (90 Base) MCG/ACT inhaler Inhale 2 puffs into the lungs every 6 (six) hours as needed for wheezing or shortness of breath.   azithromycin (ZITHROMAX) 250 MG tablet Take 2 tablets on day 1, then 1 tablet daily on days 2 through 5   buPROPion (WELLBUTRIN) 75 MG tablet Take 1 tablet (75 mg total) by mouth daily with breakfast.   cetirizine (ZYRTEC ALLERGY) 10 MG tablet Take 1 tablet (10 mg total) by mouth daily.   docusate sodium (COLACE) 100 MG capsule TAKE 1 CAPSULE BY MOUTH ONCE DAILY FOR STOOL SOFTENER   EPINEPHrine 0.3 mg/0.3 mL IJ SOAJ injection USE FOR ANAPHYLAXIS   fluticasone (FLONASE) 50 MCG/ACT nasal spray Place 1 spray into both nostrils daily.   fluticasone (FLOVENT HFA) 220 MCG/ACT inhaler INHALE 2 PUFFS INTO THE LUNGS, 2  TIMES PER DAY. RINSE MOUTH AFTER INHALATIONS   omeprazole (PRILOSEC) 40 MG capsule TAKE 1 CAPSULE BY MOUTH 2 TIMES DAILY   ondansetron (ZOFRAN-ODT) 4 MG disintegrating tablet Take 1 tablet (4 mg total) by mouth every 8 (eight) hours as needed for nausea or vomiting.   tamsulosin (FLOMAX) 0.4 MG CAPS capsule    topiramate (TOPAMAX) 50 MG tablet TAKE 1 TABLET BY MOUTH EVERY EVENING   traMADol (ULTRAM) 50 MG tablet ONE TAB PO BID PRN FOR PAIN AND HEADCAHES   traZODone (DESYREL) 50 MG tablet Take 0.5 tablets (25 mg total) by mouth at bedtime.   triamcinolone cream (KENALOG) 0.1 % Apply 1 Application topically 2 (two) times daily as needed (itching and rash).   No facility-administered encounter medications on file as of 10/27/2022.    Surgical History: Past Surgical History:  Procedure Laterality Date   ABDOMINAL HYSTERECTOMY     BACK SURGERY     COLONOSCOPY  04/27/2006   Wohl-colitis, internal hemorrhoids   COLONOSCOPY  02/2013   focal right colitis, left colon biopsy negative, focal active proctitis without chronicity   COLONOSCOPY WITH PROPOFOL N/A 07/16/2022   Procedure: COLONOSCOPY WITH PROPOFOL;  Surgeon: Toney Reil, MD;  Location: [facility] ENDOSCOPY;  Service: Gastroenterology;  Laterality: N/A;   ESOPHAGOGASTRODUODENOSCOPY  02/2013   multiple peptic & duodenal ulcers, negative h pylori   INTRAUTERINE DEVICE INSERTION  2012   LAPAROSCOPIC ENDOMETRIOSIS FULGURATION  2015   [name]   OVARIAN CYST  REMOVAL      Medical History: Past Medical History:  Diagnosis Date   B12 deficiency anemia    Chronic back pain    Chronic fatigue syndrome    Constipation    Depression    Elevated liver enzymes    Endometriosis    Fibroids    Hepatic steatosis    Hypothyroid    Last menstrual period (LMP) > 10 days ago 09/12/15   Mental retardation    Migraine    Post traumatic stress disorder    Posttraumatic stress disorder    PUD (peptic ulcer disease)    Scoliosis    Splenomegaly     Uterine fibroid     Family History: Family History  Problem Relation Age of Onset   Breast cancer Mother 49   COPD Father    Lung cancer Father 36   Dementia Father    Diabetes Sister    Diabetes Brother    Ovarian cancer Paternal Aunt    Colon cancer Maternal Grandfather 40   Liver disease Maternal Aunt        ?etiology   Lymphoma Maternal Grandmother 29   Lung cancer Other 60       maternal great grandfather   Stomach cancer Other 40       maternal great grandmother    Social History   Socioeconomic History   Marital status: Single    Spouse name: Not on file   Number of children: 0   Years of education: Not on file   Highest education level: Not on file  Occupational History   Not on file  Tobacco Use   Smoking status: Never   Smokeless tobacco: Never  Vaping Use   Vaping status: Never Used  Substance and Sexual Activity   Alcohol use: Not Currently    Alcohol/week: 0.0 standard drinks of alcohol   Drug use: No   Sexual activity: Not Currently  Other Topics Concern   Not on file  Social History Narrative   She has brother, half brother & sister live nearby   Both parents deceased   Care managed by Anselm Pancoast   Social Determinants of Health   Financial Resource Strain: Low Risk  (02/11/2017)   Overall Financial Resource Strain (CARDIA)    Difficulty of Paying Living Expenses: Not hard at all  Food Insecurity: No Food Insecurity (02/11/2017)   Hunger Vital Sign    Worried About Running Out of Food in the Last Year: Never true    Ran Out of Food in the Last Year: Never true  Transportation Needs: No Transportation Needs (02/11/2017)   PRAPARE - Administrator, Civil Service (Medical): No    Lack of Transportation (Non-Medical): No  Physical Activity: Inactive (02/11/2017)   Exercise Vital Sign    Days of Exercise per Week: 0 days    Minutes of Exercise per Session: 0 min  Stress: No Stress Concern Present (02/11/2017)   [employer] of  Occupational Health - Occupational Stress Questionnaire    Feeling of Stress : Not at all  Social Connections: Unknown (02/11/2017)   Social Connection and Isolation Panel [NHANES]    Frequency of Communication with Friends and Family: Patient declined    Frequency of Social Gatherings with Friends and Family: Patient declined    Attends Religious Services: Patient declined    Database administrator or Organizations: Patient declined    Attends Banker Meetings: Patient declined    Marital Status:  Patient declined  Intimate Partner Violence: Unknown (02/11/2017)   Humiliation, Afraid, Rape, and Kick questionnaire    Fear of Current or Ex-Partner: Patient declined    Emotionally Abused: Patient declined    Physically Abused: Patient declined    Sexually Abused: Patient declined      Review of Systems  Constitutional:  Positive for fatigue and fever.  HENT:  Negative for congestion, mouth sores and postnasal drip.   Respiratory:  Negative for cough.   Cardiovascular:  Negative for chest pain.  Genitourinary:  Negative for flank pain.  Psychiatric/Behavioral: Negative.      Vital Signs: Resp 16   Ht 5\' 3"  (1.6 m)   LMP 12/06/2015   BMI 33.87 kg/m    Observation/Objective: NAD    Assessment/Plan: 1. Flu-like symptoms Rest and drink plenty of liquids, supportive care  - oseltamivir (TAMIFLU) 75 MG capsule; Take 1 capsule (75 mg total) by mouth 2 (two) times daily.  Dispense: 10 capsule; Refill: 0  2. Sore throat - azithromycin (ZITHROMAX) 250 MG tablet; Take 2 tablets on day 1, then 1 tablet daily on days 2 through 5  Dispense: 6 tablet; Refill: 0   General Counseling: Saraia verbalizes understanding of the findings of today's phone visit and agrees with plan of treatment. I have discussed any further diagnostic evaluation that may be needed or ordered today. We also reviewed her medications today. she has been encouraged to call the office with any questions  or concerns that should arise related to todays visit.    No orders of the defined types were placed in this encounter.   Meds ordered this encounter  Medications   azithromycin (ZITHROMAX) 250 MG tablet    Sig: Take 2 tablets on day 1, then 1 tablet daily on days 2 through 5    Dispense:  6 tablet    Refill:  0    Time spent:15 Minutes    Dr Lyndon Code Internal medicine

## 2022-11-02 ENCOUNTER — Other Ambulatory Visit: Payer: Self-pay

## 2022-11-02 ENCOUNTER — Telehealth: Payer: Self-pay

## 2022-11-02 DIAGNOSIS — R1114 Bilious vomiting: Secondary | ICD-10-CM

## 2022-11-02 DIAGNOSIS — Z79899 Other long term (current) drug therapy: Secondary | ICD-10-CM

## 2022-11-02 DIAGNOSIS — Z20822 Contact with and (suspected) exposure to covid-19: Secondary | ICD-10-CM | POA: Diagnosis not present

## 2022-11-02 DIAGNOSIS — U071 COVID-19: Secondary | ICD-10-CM | POA: Diagnosis not present

## 2022-11-02 MED ORDER — ONDANSETRON 4 MG PO TBDP
4.0000 mg | ORAL_TABLET | Freq: Three times a day (TID) | ORAL | 0 refills | Status: DC | PRN
Start: 2022-11-02 — End: 2023-08-12

## 2022-11-02 NOTE — Telephone Encounter (Signed)
Pt called that she was seen last week she is not feeling better still fatigue,low grade and vomiting as per dr Welton Flakes advised her that we send nausea med and she is not feeling need to go to Urgent care

## 2022-11-04 DIAGNOSIS — R112 Nausea with vomiting, unspecified: Secondary | ICD-10-CM | POA: Diagnosis not present

## 2022-11-04 DIAGNOSIS — U071 COVID-19: Secondary | ICD-10-CM | POA: Diagnosis not present

## 2022-11-10 ENCOUNTER — Emergency Department: Payer: Medicare Other

## 2022-11-10 ENCOUNTER — Other Ambulatory Visit: Payer: Self-pay

## 2022-11-10 ENCOUNTER — Emergency Department
Admission: EM | Admit: 2022-11-10 | Discharge: 2022-11-10 | Disposition: A | Discharge status: Home / Self Care | Payer: Medicare Other | Attending: Student in an Organized Health Care Education/Training Program | Admitting: Student in an Organized Health Care Education/Training Program

## 2022-11-10 DIAGNOSIS — R1115 Cyclical vomiting syndrome unrelated to migraine: Secondary | ICD-10-CM | POA: Diagnosis not present

## 2022-11-10 DIAGNOSIS — R1031 Right lower quadrant pain: Secondary | ICD-10-CM

## 2022-11-10 DIAGNOSIS — N200 Calculus of kidney: Secondary | ICD-10-CM | POA: Diagnosis not present

## 2022-11-10 DIAGNOSIS — Z8616 Personal history of COVID-19: Secondary | ICD-10-CM | POA: Insufficient documentation

## 2022-11-10 DIAGNOSIS — R112 Nausea with vomiting, unspecified: Secondary | ICD-10-CM | POA: Diagnosis not present

## 2022-11-10 DIAGNOSIS — Z20822 Contact with and (suspected) exposure to covid-19: Secondary | ICD-10-CM | POA: Insufficient documentation

## 2022-11-10 DIAGNOSIS — R110 Nausea: Secondary | ICD-10-CM | POA: Insufficient documentation

## 2022-11-10 DIAGNOSIS — N390 Urinary tract infection, site not specified: Secondary | ICD-10-CM | POA: Insufficient documentation

## 2022-11-10 LAB — COMPREHENSIVE METABOLIC PANEL
ALT: 20 U/L (ref 0–44)
AST: 23 U/L (ref 15–41)
Albumin: 3.7 g/dL (ref 3.5–5.0)
Alkaline Phosphatase: 98 U/L (ref 38–126)
Anion gap: 8 (ref 5–15)
BUN: 7 mg/dL (ref 6–20)
CO2: 23 mmol/L (ref 22–32)
Calcium: 8.9 mg/dL (ref 8.9–10.3)
Chloride: 107 mmol/L (ref 98–111)
Creatinine, Ser: 0.79 mg/dL (ref 0.44–1.00)
GFR, Estimated: >60 mL/min (ref >60)
Glucose, Bld: 96 mg/dL (ref 70–99)
Potassium: 3.9 mmol/L (ref 3.5–5.1)
Sodium: 138 mmol/L (ref 135–145)
Total Bilirubin: 1.1 mg/dL (ref 0.3–1.2)
Total Protein: 7.8 g/dL (ref 6.5–8.1)

## 2022-11-10 LAB — URINALYSIS, ROUTINE W REFLEX MICROSCOPIC
Bilirubin Urine: NEGATIVE
Glucose, UA: NEGATIVE mg/dL
Hgb urine dipstick: NEGATIVE
Ketones, ur: NEGATIVE mg/dL
Nitrite: POSITIVE — AB
Protein, ur: 30 mg/dL — AB
Specific Gravity, Urine: 1.027 (ref 1.005–1.030)
pH: 5 (ref 5.0–8.0)

## 2022-11-10 LAB — CBC
HCT: 42.5 % (ref 36.0–46.0)
Hemoglobin: 14.2 g/dL (ref 12.0–15.0)
MCH: 28.1 pg (ref 26.0–34.0)
MCHC: 33.4 g/dL (ref 30.0–36.0)
MCV: 84.2 fL (ref 80.0–100.0)
Platelets: 182 10*3/uL (ref 150–400)
RBC: 5.05 MIL/uL (ref 3.87–5.11)
RDW: 13.1 % (ref 11.5–15.5)
WBC: 5.7 10*3/uL (ref 4.0–10.5)
nRBC: 0 % (ref 0.0–0.2)

## 2022-11-10 LAB — RESP PANEL BY RT-PCR (RSV, FLU A&B, COVID)  RVPGX2
Influenza A by PCR: NEGATIVE
Influenza B by PCR: NEGATIVE
Resp Syncytial Virus by PCR: NEGATIVE
SARS Coronavirus 2 by RT PCR: NEGATIVE

## 2022-11-10 LAB — LIPASE, BLOOD: Lipase: 28 U/L (ref 11–51)

## 2022-11-10 LAB — GROUP A STREP BY PCR: Group A Strep by PCR: NOT DETECTED

## 2022-11-10 MED ORDER — CEPHALEXIN 500 MG PO CAPS: 500.0000 mg | ORAL_CAPSULE | Freq: Three times a day (TID) | ORAL | 0 refills | Status: AC

## 2022-11-10 MED ORDER — PROMETHAZINE HCL 25 MG RE SUPP: 25.0000 mg | Freq: Four times a day (QID) | RECTAL | 0 refills | Status: DC | PRN

## 2022-11-10 MED ORDER — CEPHALEXIN 500 MG PO CAPS
500.0000 mg | ORAL_CAPSULE | Freq: Once | ORAL | Status: AC
  Administered 2022-11-10: 500 mg via ORAL
  Filled 2022-11-10: qty 1

## 2022-11-10 MED ORDER — IOHEXOL 300 MG/ML  SOLN
100.0000 mL | Freq: Once | INTRAMUSCULAR | Status: AC | PRN
  Administered 2022-11-10: 100 mL via INTRAVENOUS

## 2022-11-10 MED ORDER — ONDANSETRON HCL 4 MG/2ML IJ SOLN
4.0000 mg | Freq: Once | INTRAMUSCULAR | Status: AC
  Administered 2022-11-10: 4 mg via INTRAVENOUS
  Filled 2022-11-10: qty 2

## 2022-11-10 MED ORDER — SODIUM CHLORIDE 0.9 % IV BOLUS
1000.0000 mL | Freq: Once | INTRAVENOUS | Status: AC
  Administered 2022-11-10: 1000 mL via INTRAVENOUS

## 2022-11-10 MED ORDER — MORPHINE SULFATE (PF) 4 MG/ML IV SOLN
4.0000 mg | INTRAVENOUS | Status: DC | PRN
  Administered 2022-11-10: 4 mg via INTRAVENOUS
  Filled 2022-11-10: qty 1

## 2022-11-10 NOTE — ED Provider Notes (Signed)
[facility] Provider Note    Event Date/Time   First MD Initiated Contact with Patient 11/10/22 1457     (approximate)   History   Abdominal Pain   HPI  Teresa Crawford is a 37 y.o. female with recent diagnosis of COVID presents to the ER for evaluation of persistent nausea vomiting and right lower quadrant pain.  Denies any dysuria no flank pain.  Has had some chills.  Has been given Zofran without much improvement in her nausea.  Has never had pain like this before.     Physical Exam   Triage Vital Signs: ED Triage Vitals  Encounter Vitals Group     BP 11/10/22 1253 125/78     Systolic BP Percentile --      Diastolic BP Percentile --      Pulse Rate 11/10/22 1253 88     Resp 11/10/22 1253 18     Temp 11/10/22 1253 98.6 F (37 C)     Temp Source 11/10/22 1253 Oral     SpO2 11/10/22 1253 95 %     Weight --      Height 11/10/22 1253 5\' 6"  (1.676 m)     Head Circumference --      Peak Flow --      Pain Score 11/10/22 1257 9     Pain Loc --      Pain Education --      Exclude from Growth Chart --     Most recent vital signs: Vitals:   11/10/22 1253  BP: 125/78  Pulse: 88  Resp: 18  Temp: 98.6 F (37 C)  SpO2: 95%     Constitutional: Alert  Eyes: Conjunctivae are normal.  Head: Atraumatic. Nose: No congestion/rhinnorhea. Mouth/Throat: Mucous membranes are moist.   Neck: Painless ROM.  Cardiovascular:   Good peripheral circulation. Respiratory: Normal respiratory effort.  No retractions.  Gastrointestinal: Soft tenderness palpation in the right lower quadrant no guarding or rebound. Musculoskeletal:  no deformity Neurologic:  MAE spontaneously. No gross focal neurologic deficits are appreciated.  Skin:  Skin is warm, dry and intact. No rash noted. Psychiatric: Mood and affect are normal. Speech and behavior are normal.    ED Results / Procedures / Treatments   Labs (all labs ordered are listed, but only abnormal results  are displayed) Labs Reviewed  URINALYSIS, ROUTINE W REFLEX MICROSCOPIC - Abnormal; Notable for the following components:      Result Value   Color, Urine AMBER (*)    APPearance CLOUDY (*)    Protein, ur 30 (*)    Nitrite POSITIVE (*)    Leukocytes,Ua MODERATE (*)    Bacteria, UA MANY (*)    All other components within normal limits  RESP PANEL BY RT-PCR (RSV, FLU A&B, COVID)  RVPGX2  GROUP A STREP BY PCR  LIPASE, BLOOD  COMPREHENSIVE METABOLIC PANEL  CBC     EKG     RADIOLOGY Please see ED Course for my review and interpretation.  I personally reviewed all radiographic images ordered to evaluate for the above acute complaints and reviewed radiology reports and findings.  These findings were personally discussed with the patient.  Please see medical record for radiology report.    PROCEDURES:  Critical Care performed:   Procedures   MEDICATIONS ORDERED IN ED: Medications  morphine (PF) 4 MG/ML injection 4 mg (4 mg Intravenous Given 11/10/22 1539)  sodium chloride 0.9 % bolus 1,000 mL (0 mLs Intravenous Stopped 11/10/22  1657)  ondansetron (ZOFRAN) injection 4 mg (4 mg Intravenous Given 11/10/22 1538)  iohexol (OMNIPAQUE) 300 MG/ML solution 100 mL (100 mLs Intravenous Contrast Given 11/10/22 1547)  cephALEXin (KEFLEX) capsule 500 mg (500 mg Oral Given 11/10/22 1700)     IMPRESSION / MDM / ASSESSMENT AND PLAN / ED COURSE  I reviewed the triage vital signs and the nursing notes.                              Differential diagnosis includes, but is not limited to, appendicitis, colitis, diverticulitis, abscess, pyelonephritis, stone, ovarian cyst, torsion, musculoskeletal strain  Patient presenting to the ER for evaluation of symptoms as described above.  Based on symptoms, risk factors and considered above differential, this presenting complaint could reflect a potentially life-threatening illness therefore the patient will be placed on continuous pulse oximetry and telemetry  for monitoring.  Laboratory evaluation will be sent to evaluate for the above complaints.  Patient's blood work is reassuring does have bacteria on urinalysis she not septic appearing but does have quite a bit of pain in the right lower quadrant concerning for appendicitis.  Doubt torsion given history and duration of symptoms.  Will order CT to further evaluate will give IV morphine for pain will give IV fluids.  Clinical Course as of 11/10/22 1708  Tue Nov 10, 2022  1707 CT on my review and interpretation without evidence of appendicitis.  Per radiology read formal report no appendicitis and no adnexal findings.  Tolerating p.o. without complication.  Does appear stable and appropriate for outpatient follow-up.  We discussed strict return precautions. [PR]    Clinical Course User Index [PR] Willy Eddy, MD     FINAL CLINICAL IMPRESSION(S) / ED DIAGNOSES   Final diagnoses:  Right lower quadrant abdominal pain  Lower urinary tract infectious disease     Rx / DC Orders   ED Discharge Orders          Ordered    cephALEXin (KEFLEX) 500 MG capsule  3 times daily        11/10/22 1650    promethazine (PHENERGAN) 25 MG suppository  Every 6 hours PRN        11/10/22 1650             Note:  This document was prepared using Dragon voice recognition software and may include unintentional dictation errors.    Willy Eddy, MD 11/10/22 [phone]

## 2022-11-10 NOTE — ED Triage Notes (Signed)
Pt c/o abd pain with n/v and diarrhea x2 weeks. Pt reports testing positive for Covid last Monday.

## 2022-11-17 ENCOUNTER — Telehealth: Payer: Self-pay

## 2022-11-17 NOTE — Telephone Encounter (Signed)
Transition Care Management Unsuccessful Follow-up Telephone Call  Date of discharge and from where:  [place] 8/6  Attempts:  1st Attempt  Reason for unsuccessful TCM follow-up call:  No answer/busy   [facility] Guide, [facility] [phone] [address] [place], [place], [place] [address] Phone: [phone] Email: [email or web address]

## 2022-11-18 ENCOUNTER — Telehealth: Payer: Self-pay

## 2022-11-18 NOTE — Telephone Encounter (Signed)
Transition Care Management Unsuccessful Follow-up Telephone Call  Date of discharge and from where:  [place] 8/6  Attempts:  2nd Attempt  Reason for unsuccessful TCM follow-up call:  No answer/busy   Lenard Forth [facility] [place] Guide, [facility] [phone] [address] [place], [place], [place] [address] Phone: [phone] Email: [email or web address]

## 2022-11-19 ENCOUNTER — Encounter: Payer: Self-pay | Admitting: Nurse Practitioner

## 2022-11-19 ENCOUNTER — Telehealth: Payer: Medicare Other | Admitting: Nurse Practitioner

## 2022-11-19 VITALS — Resp 16 | Ht 63.0 in

## 2022-11-19 DIAGNOSIS — U099 Post covid-19 condition, unspecified: Secondary | ICD-10-CM | POA: Diagnosis not present

## 2022-11-19 DIAGNOSIS — R1114 Bilious vomiting: Secondary | ICD-10-CM | POA: Diagnosis not present

## 2022-11-19 DIAGNOSIS — N3000 Acute cystitis without hematuria: Secondary | ICD-10-CM

## 2022-11-19 MED ORDER — BENZONATATE 200 MG PO CAPS: 200.0000 mg | ORAL_CAPSULE | Freq: Two times a day (BID) | ORAL | 2 refills | Status: DC | PRN

## 2022-11-19 NOTE — Progress Notes (Signed)
[facility] [address] [place], [place] [address]  Internal MEDICINE  Telephone Visit  Patient Name: Teresa Crawford  [id number]  [id number]  Date of Service: 11/19/2022  I connected with the patient at 1130 by telephone and verified the patients identity using two identifiers.   I discussed the limitations, risks, security and privacy concerns of performing an evaluation and management service by telephone and the availability of in person appointments. I also discussed with the patient that there may be a patient responsible charge related to the service.  The patient expressed understanding and agrees to proceed.    Chief Complaint  Patient presents with   Telephone Screen    Ed f/u    Telephone Assessment    HPI Teresa Crawford presents for a telehealth virtual visit for ED follow up for lower abdominal pain.  Patient was diagnosed with RLQ pain and lower UTI.  ER UA was positive for nitrites, leukocytes, and bacteria.  She was prescribed cephalexin for her UTI and promethazine for nausea when she was discharged from the ER.    Current Medication: Outpatient Encounter Medications as of 11/19/2022  Medication Sig   benzonatate (TESSALON) 200 MG capsule Take 1 capsule (200 mg total) by mouth 2 (two) times daily as needed for cough.   acetaminophen (TYLENOL) 500 MG tablet 2 TABS PO BID PRN FOR HEADACHES AND PAINS   albuterol (VENTOLIN HFA) 108 (90 Base) MCG/ACT inhaler Inhale 2 puffs into the lungs every 6 (six) hours as needed for wheezing or shortness of breath.   buPROPion (WELLBUTRIN) 75 MG tablet Take 1 tablet (75 mg total) by mouth daily with breakfast.   cetirizine (ZYRTEC ALLERGY) 10 MG tablet Take 1 tablet (10 mg total) by mouth daily.   docusate sodium (COLACE) 100 MG capsule TAKE 1 CAPSULE BY MOUTH ONCE DAILY FOR STOOL SOFTENER   EPINEPHrine 0.3 mg/0.3 mL IJ SOAJ injection USE FOR ANAPHYLAXIS   fluticasone (FLONASE) 50 MCG/ACT nasal spray Place 1 spray into both  nostrils daily.   fluticasone (FLOVENT HFA) 220 MCG/ACT inhaler INHALE 2 PUFFS INTO THE LUNGS, 2 TIMES PER DAY. RINSE MOUTH AFTER INHALATIONS   omeprazole (PRILOSEC) 40 MG capsule TAKE 1 CAPSULE BY MOUTH 2 TIMES DAILY   ondansetron (ZOFRAN-ODT) 4 MG disintegrating tablet Take 1 tablet (4 mg total) by mouth every 8 (eight) hours as needed for nausea or vomiting.   oseltamivir (TAMIFLU) 75 MG capsule Take 1 capsule (75 mg total) by mouth 2 (two) times daily.   promethazine (PHENERGAN) 25 MG suppository Place 1 suppository (25 mg total) rectally every 6 (six) hours as needed for nausea or vomiting.   tamsulosin (FLOMAX) 0.4 MG CAPS capsule    topiramate (TOPAMAX) 50 MG tablet TAKE 1 TABLET BY MOUTH EVERY EVENING   traMADol (ULTRAM) 50 MG tablet ONE TAB PO BID PRN FOR PAIN AND HEADCAHES   traZODone (DESYREL) 50 MG tablet Take 0.5 tablets (25 mg total) by mouth at bedtime.   triamcinolone cream (KENALOG) 0.1 % Apply 1 Application topically 2 (two) times daily as needed (itching and rash).   No facility-administered encounter medications on file as of 11/19/2022.    Surgical History: Past Surgical History:  Procedure Laterality Date   ABDOMINAL HYSTERECTOMY     BACK SURGERY     COLONOSCOPY  04/27/2006   Wohl-colitis, internal hemorrhoids   COLONOSCOPY  02/2013   focal right colitis, left colon biopsy negative, focal active proctitis without chronicity   COLONOSCOPY WITH PROPOFOL N/A 07/16/2022  Procedure: COLONOSCOPY WITH PROPOFOL;  Surgeon: Toney Reil, MD;  Location: [facility] ENDOSCOPY;  Service: Gastroenterology;  Laterality: N/A;   ESOPHAGOGASTRODUODENOSCOPY  02/2013   multiple peptic & duodenal ulcers, negative h pylori   INTRAUTERINE DEVICE INSERTION  2012   LAPAROSCOPIC ENDOMETRIOSIS FULGURATION  2015   [name]   OVARIAN CYST REMOVAL      Medical History: Past Medical History:  Diagnosis Date   B12 deficiency anemia    Chronic back pain    Chronic fatigue syndrome     Constipation    Depression    Elevated liver enzymes    Endometriosis    Fibroids    Hepatic steatosis    Hypothyroid    Last menstrual period (LMP) > 10 days ago 09/12/15   Mental retardation    Migraine    Post traumatic stress disorder    Posttraumatic stress disorder    PUD (peptic ulcer disease)    Scoliosis    Splenomegaly    Uterine fibroid     Family History: Family History  Problem Relation Age of Onset   Breast cancer Mother 7   COPD Father    Lung cancer Father 66   Dementia Father    Diabetes Sister    Diabetes Brother    Ovarian cancer Paternal Aunt    Colon cancer Maternal Grandfather 54   Liver disease Maternal Aunt        ?etiology   Lymphoma Maternal Grandmother 6   Lung cancer Other 58       maternal great grandfather   Stomach cancer Other 25       maternal great grandmother    Social History   Socioeconomic History   Marital status: Single    Spouse name: Not on file   Number of children: 0   Years of education: Not on file   Highest education level: Not on file  Occupational History   Not on file  Tobacco Use   Smoking status: Never   Smokeless tobacco: Never  Vaping Use   Vaping status: Never Used  Substance and Sexual Activity   Alcohol use: Not Currently    Alcohol/week: 0.0 standard drinks of alcohol   Drug use: No   Sexual activity: Not Currently  Other Topics Concern   Not on file  Social History Narrative   She has brother, half brother & sister live nearby   Both parents deceased   Care managed by Anselm Pancoast   Social Determinants of Health   Financial Resource Strain: Low Risk  (02/11/2017)   Overall Financial Resource Strain (CARDIA)    Difficulty of Paying Living Expenses: Not hard at all  Food Insecurity: No Food Insecurity (02/11/2017)   Hunger Vital Sign    Worried About Running Out of Food in the Last Year: Never true    Ran Out of Food in the Last Year: Never true  Transportation Needs: No Transportation  Needs (02/11/2017)   PRAPARE - Administrator, Civil Service (Medical): No    Lack of Transportation (Non-Medical): No  Physical Activity: Inactive (02/11/2017)   Exercise Vital Sign    Days of Exercise per Week: 0 days    Minutes of Exercise per Session: 0 min  Stress: No Stress Concern Present (02/11/2017)   [employer] of Occupational Health - Occupational Stress Questionnaire    Feeling of Stress : Not at all  Social Connections: Unknown (02/11/2017)   Social Connection and Isolation Panel [NHANES]  Frequency of Communication with Friends and Family: Patient declined    Frequency of Social Gatherings with Friends and Family: Patient declined    Attends Religious Services: Patient declined    Database administrator or Organizations: Patient declined    Attends Banker Meetings: Patient declined    Marital Status: Patient declined  Intimate Partner Violence: Unknown (02/11/2017)   Humiliation, Afraid, Rape, and Kick questionnaire    Fear of Current or Ex-Partner: Patient declined    Emotionally Abused: Patient declined    Physically Abused: Patient declined    Sexually Abused: Patient declined      Review of Systems  Constitutional:  Positive for fatigue.  HENT: Negative.    Respiratory:  Negative for cough, chest tightness, shortness of breath and wheezing.   Cardiovascular: Negative.  Negative for chest pain and palpitations.  Gastrointestinal:  Positive for nausea and vomiting.  Genitourinary:  Positive for dysuria, flank pain, frequency, hematuria, pelvic pain and urgency.    Vital Signs: Resp 16   Ht 5\' 3"  (1.6 m)   LMP 12/06/2015   BMI 33.87 kg/m    Observation/Objective: She is alert and oriented. No acute distress noted.     Assessment/Plan: 1. Post-COVID syndrome Lingering cough, take benzonatate as needed for relief. - benzonatate (TESSALON) 200 MG capsule; Take 1 capsule (200 mg total) by mouth 2 (two) times daily as needed  for cough.  Dispense: 30 capsule; Refill: 2  2. Acute cystitis without hematuria Continue cephalexin until gone  3. Bilious vomiting with nausea Continue prn promethazine as prescribed for nausea and vomiting.    General Counseling: sherita pickell understanding of the findings of today's phone visit and agrees with plan of treatment. I have discussed any further diagnostic evaluation that may be needed or ordered today. We also reviewed her medications today. she has been encouraged to call the office with any questions or concerns that should arise related to todays visit.  Return if symptoms worsen or fail to improve.   No orders of the defined types were placed in this encounter.   Meds ordered this encounter  Medications   benzonatate (TESSALON) 200 MG capsule    Sig: Take 1 capsule (200 mg total) by mouth 2 (two) times daily as needed for cough.    Dispense:  30 capsule    Refill:  2    Fill new script and deliver to patient today    Time spent:20 Minutes Time spent with patient included reviewing progress notes, labs, imaging studies, and discussing plan for follow up.  [place] Controlled Substance Database was reviewed by me for overdose risk score (ORS) if appropriate.  This patient was seen by Sallyanne Kuster, FNP-C in collaboration with Dr. Beverely Risen as a part of collaborative care agreement.   R. Tedd Sias, MSN, FNP-C Internal medicine

## 2022-11-25 ENCOUNTER — Ambulatory Visit (INDEPENDENT_AMBULATORY_CARE_PROVIDER_SITE_OTHER): Payer: Medicare Other | Admitting: Nurse Practitioner

## 2022-11-25 ENCOUNTER — Encounter: Payer: Self-pay | Admitting: Nurse Practitioner

## 2022-11-25 VITALS — BP 118/84 | HR 74 | Temp 98.1°F | Resp 16 | Ht 63.0 in | Wt 198.3 lb

## 2022-11-25 DIAGNOSIS — H66002 Acute suppurative otitis media without spontaneous rupture of ear drum, left ear: Secondary | ICD-10-CM

## 2022-11-25 DIAGNOSIS — G9331 Postviral fatigue syndrome: Secondary | ICD-10-CM | POA: Diagnosis not present

## 2022-11-25 DIAGNOSIS — Z79899 Other long term (current) drug therapy: Secondary | ICD-10-CM | POA: Diagnosis not present

## 2022-11-25 DIAGNOSIS — U099 Post covid-19 condition, unspecified: Secondary | ICD-10-CM

## 2022-11-25 DIAGNOSIS — E538 Deficiency of other specified B group vitamins: Secondary | ICD-10-CM

## 2022-11-25 DIAGNOSIS — R5383 Other fatigue: Secondary | ICD-10-CM

## 2022-11-25 DIAGNOSIS — E559 Vitamin D deficiency, unspecified: Secondary | ICD-10-CM

## 2022-11-25 MED ORDER — TRAMADOL HCL 50 MG PO TABS
ORAL_TABLET | ORAL | 2 refills | Status: DC
Start: 2022-11-25 — End: 2023-05-24

## 2022-11-25 MED ORDER — PROMETHAZINE HCL 25 MG RE SUPP
25.0000 mg | Freq: Four times a day (QID) | RECTAL | 0 refills | Status: DC | PRN
Start: 2022-11-25 — End: 2022-12-09

## 2022-11-25 MED ORDER — AMOXICILLIN-POT CLAVULANATE 875-125 MG PO TABS
1.0000 | ORAL_TABLET | Freq: Two times a day (BID) | ORAL | 0 refills | Status: AC
Start: 2022-11-25 — End: 2022-12-05

## 2022-11-25 MED ORDER — NEOMYCIN-POLYMYXIN-HC 1 % OT SOLN
3.0000 [drp] | Freq: Four times a day (QID) | OTIC | 0 refills | Status: AC
Start: 2022-11-25 — End: 2022-11-30

## 2022-11-25 NOTE — Progress Notes (Signed)
[facility] [address] [place], [place] [address]  Internal MEDICINE  Office Visit Note  Patient Name: Teresa Crawford  [id number]  [id number]  Date of Service: 11/25/2022  Chief Complaint  Patient presents with   Acute Visit    Ear infection     HPI Teresa Crawford presents for an acute sick visit for left ear infection --onset on Monday this week Possible ear infection on left side. Having dizziness, decresae hearing, pressure, tenderness, ear pain.  She also continues to have fatigue, nausea and dizziness since having covid a few weeks ago.    Current Medication:  Outpatient Encounter Medications as of 11/25/2022  Medication Sig   acetaminophen (TYLENOL) 500 MG tablet 2 TABS PO BID PRN FOR HEADACHES AND PAINS   albuterol (VENTOLIN HFA) 108 (90 Base) MCG/ACT inhaler Inhale 2 puffs into the lungs every 6 (six) hours as needed for wheezing or shortness of breath.   amoxicillin-clavulanate (AUGMENTIN) 875-125 MG tablet Take 1 tablet by mouth 2 (two) times daily for 10 days.   benzonatate (TESSALON) 200 MG capsule Take 1 capsule (200 mg total) by mouth 2 (two) times daily as needed for cough.   buPROPion (WELLBUTRIN) 75 MG tablet Take 1 tablet (75 mg total) by mouth daily with breakfast.   cetirizine (ZYRTEC ALLERGY) 10 MG tablet Take 1 tablet (10 mg total) by mouth daily.   docusate sodium (COLACE) 100 MG capsule TAKE 1 CAPSULE BY MOUTH ONCE DAILY FOR STOOL SOFTENER   EPINEPHrine 0.3 mg/0.3 mL IJ SOAJ injection USE FOR ANAPHYLAXIS   fluticasone (FLONASE) 50 MCG/ACT nasal spray Place 1 spray into both nostrils daily.   fluticasone (FLOVENT HFA) 220 MCG/ACT inhaler INHALE 2 PUFFS INTO THE LUNGS, 2 TIMES PER DAY. RINSE MOUTH AFTER INHALATIONS   NEOMYCIN-POLYMYXIN-HYDROCORTISONE (CORTISPORIN) 1 % SOLN OTIC solution Place 3 drops into the left ear 4 (four) times daily for 5 days.   omeprazole (PRILOSEC) 40 MG capsule TAKE 1 CAPSULE BY MOUTH 2 TIMES DAILY   ondansetron  (ZOFRAN-ODT) 4 MG disintegrating tablet Take 1 tablet (4 mg total) by mouth every 8 (eight) hours as needed for nausea or vomiting.   tamsulosin (FLOMAX) 0.4 MG CAPS capsule    topiramate (TOPAMAX) 50 MG tablet TAKE 1 TABLET BY MOUTH EVERY EVENING   traZODone (DESYREL) 50 MG tablet Take 0.5 tablets (25 mg total) by mouth at bedtime.   triamcinolone cream (KENALOG) 0.1 % Apply 1 Application topically 2 (two) times daily as needed (itching and rash).   [DISCONTINUED] oseltamivir (TAMIFLU) 75 MG capsule Take 1 capsule (75 mg total) by mouth 2 (two) times daily.   [DISCONTINUED] promethazine (PHENERGAN) 25 MG suppository Place 1 suppository (25 mg total) rectally every 6 (six) hours as needed for nausea or vomiting.   [DISCONTINUED] traMADol (ULTRAM) 50 MG tablet ONE TAB PO BID PRN FOR PAIN AND HEADCAHES   promethazine (PHENERGAN) 25 MG suppository Place 1 suppository (25 mg total) rectally every 6 (six) hours as needed for nausea or vomiting.   traMADol (ULTRAM) 50 MG tablet ONE TAB PO BID PRN FOR PAIN AND HEADCAHES   No facility-administered encounter medications on file as of 11/25/2022.      Medical History: Past Medical History:  Diagnosis Date   B12 deficiency anemia    Chronic back pain    Chronic fatigue syndrome    Constipation    Depression    Elevated liver enzymes    Endometriosis    Fibroids    Hepatic steatosis  Hypothyroid    Last menstrual period (LMP) > 10 days ago 09/12/15   Mental retardation    Migraine    Post traumatic stress disorder    Posttraumatic stress disorder    PUD (peptic ulcer disease)    Scoliosis    Splenomegaly    Uterine fibroid      Vital Signs: BP 118/84   Pulse 74   Temp 98.1 F (36.7 C)   Resp 16   Ht 5\' 3"  (1.6 m)   Wt 198 lb 4.8 oz (89.9 kg)   LMP 12/06/2015   SpO2 99%   BMI 35.13 kg/m    Review of Systems  Constitutional:  Positive for appetite change and fatigue.  HENT:  Positive for congestion, ear pain, postnasal drip,  rhinorrhea, sinus pain and sore throat.   Respiratory:  Positive for cough. Negative for chest tightness, shortness of breath and wheezing.   Cardiovascular: Negative.  Negative for chest pain and palpitations.  Gastrointestinal:  Positive for nausea.  Musculoskeletal: Negative.     Physical Exam Vitals reviewed.  Constitutional:      General: She is not in acute distress.    Appearance: Normal appearance. She is obese. She is ill-appearing.  HENT:     Head: Normocephalic and atraumatic.     Right Ear: Tympanic membrane, ear canal and external ear normal.     Left Ear: Swelling and tenderness present. A middle ear effusion is present. Tympanic membrane is erythematous and bulging.     Nose: Mucosal edema, congestion and rhinorrhea present.     Right Turbinates: Swollen and pale.     Left Turbinates: Swollen and pale.     Right Sinus: Maxillary sinus tenderness present. No frontal sinus tenderness.     Left Sinus: Maxillary sinus tenderness present. No frontal sinus tenderness.     Mouth/Throat:     Lips: Pink.     Mouth: Mucous membranes are moist.     Pharynx: Posterior oropharyngeal erythema present.  Neurological:     Mental Status: She is alert.       Assessment/Plan: 1. Non-recurrent acute suppurative otitis media of left ear without spontaneous rupture of tympanic membrane Oral antibiotics and antibiotic ear drops prescribed. Take as ordered.  - amoxicillin-clavulanate (AUGMENTIN) 875-125 MG tablet; Take 1 tablet by mouth 2 (two) times daily for 10 days.  Dispense: 20 tablet; Refill: 0 - NEOMYCIN-POLYMYXIN-HYDROCORTISONE (CORTISPORIN) 1 % SOLN OTIC solution; Place 3 drops into the left ear 4 (four) times daily for 5 days.  Dispense: 10 mL; Refill: 0  2. Post-COVID syndrome Labs ordered for further evaluation  - CBC with Differential/Platelet - CMP14+EGFR - Vitamin D (25 hydroxy) - TSH + free T4 - B12 and Folate Panel - Iron, TIBC and Ferritin Panel  3. Postviral  fatigue syndrome Labs ordered for further evaluation  - CBC with Differential/Platelet - CMP14+EGFR - Vitamin D (25 hydroxy) - TSH + free T4 - B12 and Folate Panel - Iron, TIBC and Ferritin Panel  4. B12 deficiency Routine labs ordered  - CBC with Differential/Platelet - CMP14+EGFR - Vitamin D (25 hydroxy) - TSH + free T4 - B12 and Folate Panel - Iron, TIBC and Ferritin Panel  5. Vitamin D deficiency Routine lab ordered  - Vitamin D (25 hydroxy)  6. Encounter for medication review Medication list reviewed, updated and refills ordered  - traMADol (ULTRAM) 50 MG tablet; ONE TAB PO BID PRN FOR PAIN AND HEADCAHES  Dispense: 60 tablet; Refill: 2 - promethazine (PHENERGAN)  25 MG suppository; Place 1 suppository (25 mg total) rectally every 6 (six) hours as needed for nausea or vomiting.  Dispense: 12 each; Refill: 0   General Counseling: Teresa Crawford verbalizes understanding of the findings of todays visit and agrees with plan of treatment. I have discussed any further diagnostic evaluation that may be needed or ordered today. We also reviewed her medications today. she has been encouraged to call the office with any questions or concerns that should arise related to todays visit.    Counseling:    Orders Placed This Encounter  Procedures   CBC with Differential/Platelet   CMP14+EGFR   Vitamin D (25 hydroxy)   TSH + free T4   B12 and Folate Panel   Iron, TIBC and Ferritin Panel    Meds ordered this encounter  Medications   amoxicillin-clavulanate (AUGMENTIN) 875-125 MG tablet    Sig: Take 1 tablet by mouth 2 (two) times daily for 10 days.    Dispense:  20 tablet    Refill:  0   traMADol (ULTRAM) 50 MG tablet    Sig: ONE TAB PO BID PRN FOR PAIN AND HEADCAHES    Dispense:  60 tablet    Refill:  2   NEOMYCIN-POLYMYXIN-HYDROCORTISONE (CORTISPORIN) 1 % SOLN OTIC solution    Sig: Place 3 drops into the left ear 4 (four) times daily for 5 days.    Dispense:  10 mL    Refill:   0    Fill new script asap.   promethazine (PHENERGAN) 25 MG suppository    Sig: Place 1 suppository (25 mg total) rectally every 6 (six) hours as needed for nausea or vomiting.    Dispense:  12 each    Refill:  0    Return for previously scheduled, F/U, Gualberto Wahlen PCP in october.   Controlled Substance Database was reviewed by me for overdose risk score (ORS)  Time spent:30 Minutes Time spent with patient included reviewing progress notes, labs, imaging studies, and discussing plan for follow up.   This patient was seen by Sallyanne Kuster, FNP-C in collaboration with Dr. Beverely Risen as a part of collaborative care agreement.  Dayvin Aber R. Tedd Sias, MSN, FNP-C Internal Medicine

## 2022-11-29 ENCOUNTER — Encounter: Payer: Self-pay | Admitting: Nurse Practitioner

## 2022-11-30 ENCOUNTER — Telehealth: Payer: Self-pay | Admitting: Nurse Practitioner

## 2022-11-30 NOTE — Telephone Encounter (Signed)
S/w pt she states that she will be dropping off papers for the provider to sign some time this week-nm

## 2022-12-08 ENCOUNTER — Other Ambulatory Visit: Payer: Self-pay | Admitting: Nurse Practitioner

## 2022-12-08 DIAGNOSIS — Z760 Encounter for issue of repeat prescription: Secondary | ICD-10-CM

## 2022-12-09 ENCOUNTER — Encounter: Payer: Self-pay | Admitting: Nurse Practitioner

## 2022-12-09 ENCOUNTER — Telehealth (INDEPENDENT_AMBULATORY_CARE_PROVIDER_SITE_OTHER): Payer: Medicare Other | Admitting: Nurse Practitioner

## 2022-12-09 VITALS — Resp 16 | Ht 63.0 in

## 2022-12-09 DIAGNOSIS — R1114 Bilious vomiting: Secondary | ICD-10-CM

## 2022-12-09 DIAGNOSIS — J208 Acute bronchitis due to other specified organisms: Secondary | ICD-10-CM | POA: Diagnosis not present

## 2022-12-09 DIAGNOSIS — B9689 Other specified bacterial agents as the cause of diseases classified elsewhere: Secondary | ICD-10-CM | POA: Diagnosis not present

## 2022-12-09 DIAGNOSIS — Z79899 Other long term (current) drug therapy: Secondary | ICD-10-CM

## 2022-12-09 MED ORDER — PROMETHAZINE HCL 25 MG RE SUPP
25.0000 mg | Freq: Four times a day (QID) | RECTAL | 0 refills | Status: DC | PRN
Start: 2022-12-09 — End: 2023-05-11

## 2022-12-09 MED ORDER — HYDROCOD POLI-CHLORPHE POLI ER 10-8 MG/5ML PO SUER
5.0000 mL | Freq: Two times a day (BID) | ORAL | 0 refills | Status: AC | PRN
Start: 2022-12-09 — End: ?

## 2022-12-09 MED ORDER — AMOXICILLIN-POT CLAVULANATE 875-125 MG PO TABS
1.0000 | ORAL_TABLET | Freq: Two times a day (BID) | ORAL | 0 refills | Status: AC
Start: 2022-12-09 — End: 2022-12-19

## 2022-12-09 NOTE — Progress Notes (Signed)
[facility] [address] [place], [place] [address]  Internal MEDICINE  Telephone Visit  Patient Name: Teresa Crawford  [id number]  [id number]  Date of Service: 12/09/2022  I connected with the patient at 1650 by telephone and verified the patients identity using two identifiers.   I discussed the limitations, risks, security and privacy concerns of performing an evaluation and management service by telephone and the availability of in person appointments. I also discussed with the patient that there may be a patient responsible charge related to the service.  The patient expressed understanding and agrees to proceed.    Chief Complaint  Patient presents with   Telephone Screen    Congestion, cough, medicine for cough is not helping, lymph nodes are swollen, hard time swallowing, SOB at night. Head hurts and dizzy and very tired. Stomach hurts like on the side.    Telephone Assessment   Headache   Cough    HPI Teresa Crawford presents for a telehealth virtual visit for symptoms of URI or sinusitis  Reports cough, congestion, swollen lymph nodes, sore throat headache, dizziness, fatigue, SOB and chest wall pain due to coughing. Symptoms started over the weekend Wheezing some too    Current Medication: Outpatient Encounter Medications as of 12/09/2022  Medication Sig   acetaminophen (TYLENOL) 500 MG tablet 2 TABS PO BID PRN FOR HEADACHES AND PAINS   albuterol (VENTOLIN HFA) 108 (90 Base) MCG/ACT inhaler Inhale 2 puffs into the lungs every 6 (six) hours as needed for wheezing or shortness of breath.   amoxicillin-clavulanate (AUGMENTIN) 875-125 MG tablet Take 1 tablet by mouth 2 (two) times daily for 10 days. Take with food   benzonatate (TESSALON) 200 MG capsule Take 1 capsule (200 mg total) by mouth 2 (two) times daily as needed for cough.   buPROPion (WELLBUTRIN) 75 MG tablet Take 1 tablet (75 mg total) by mouth daily with breakfast.   cetirizine (ZYRTEC ALLERGY) 10 MG tablet  Take 1 tablet (10 mg total) by mouth daily.   chlorpheniramine-HYDROcodone (TUSSIONEX) 10-8 MG/5ML Take 5 mLs by mouth every 12 (twelve) hours as needed for cough.   docusate sodium (COLACE) 100 MG capsule TAKE 1 CAPSULE BY MOUTH ONCE DAILY FOR STOOL SOFTENER   EPINEPHrine 0.3 mg/0.3 mL IJ SOAJ injection USE FOR ANAPHYLAXIS   fluticasone (FLONASE) 50 MCG/ACT nasal spray Place 1 spray into both nostrils daily.   fluticasone (FLOVENT HFA) 220 MCG/ACT inhaler INHALE 2 PUFFS INTO THE LUNGS, 2 TIMES PER DAY. RINSE MOUTH AFTER INHALATIONS   omeprazole (PRILOSEC) 40 MG capsule TAKE 1 CAPSULE BY MOUTH TWICE A DAY   ondansetron (ZOFRAN-ODT) 4 MG disintegrating tablet Take 1 tablet (4 mg total) by mouth every 8 (eight) hours as needed for nausea or vomiting.   tamsulosin (FLOMAX) 0.4 MG CAPS capsule    topiramate (TOPAMAX) 50 MG tablet TAKE 1 TABLET BY MOUTH EVERY EVENING   traMADol (ULTRAM) 50 MG tablet ONE TAB PO BID PRN FOR PAIN AND HEADCAHES   traZODone (DESYREL) 50 MG tablet Take 0.5 tablets (25 mg total) by mouth at bedtime.   triamcinolone cream (KENALOG) 0.1 % Apply 1 Application topically 2 (two) times daily as needed (itching and rash).   [DISCONTINUED] promethazine (PHENERGAN) 25 MG suppository Place 1 suppository (25 mg total) rectally every 6 (six) hours as needed for nausea or vomiting.   promethazine (PHENERGAN) 25 MG suppository Place 1 suppository (25 mg total) rectally every 6 (six) hours as needed for nausea or vomiting.   No  facility-administered encounter medications on file as of 12/09/2022.    Surgical History: Past Surgical History:  Procedure Laterality Date   ABDOMINAL HYSTERECTOMY     BACK SURGERY     COLONOSCOPY  04/27/2006   Wohl-colitis, internal hemorrhoids   COLONOSCOPY  02/2013   focal right colitis, left colon biopsy negative, focal active proctitis without chronicity   COLONOSCOPY WITH PROPOFOL N/A 07/16/2022   Procedure: COLONOSCOPY WITH PROPOFOL;  Surgeon: Toney Reil, MD;  Location: [facility] ENDOSCOPY;  Service: Gastroenterology;  Laterality: N/A;   ESOPHAGOGASTRODUODENOSCOPY  02/2013   multiple peptic & duodenal ulcers, negative h pylori   INTRAUTERINE DEVICE INSERTION  2012   LAPAROSCOPIC ENDOMETRIOSIS FULGURATION  2015   [name]   OVARIAN CYST REMOVAL      Medical History: Past Medical History:  Diagnosis Date   B12 deficiency anemia    Chronic back pain    Chronic fatigue syndrome    Constipation    Depression    Elevated liver enzymes    Endometriosis    Fibroids    Hepatic steatosis    Hypothyroid    Last menstrual period (LMP) > 10 days ago 09/12/15   Mental retardation    Migraine    Post traumatic stress disorder    Posttraumatic stress disorder    PUD (peptic ulcer disease)    Scoliosis    Splenomegaly    Uterine fibroid     Family History: Family History  Problem Relation Age of Onset   Breast cancer Mother 87   COPD Father    Lung cancer Father 81   Dementia Father    Diabetes Sister    Diabetes Brother    Ovarian cancer Paternal Aunt    Colon cancer Maternal Grandfather 61   Liver disease Maternal Aunt        ?etiology   Lymphoma Maternal Grandmother 9   Lung cancer Other 106       maternal great grandfather   Stomach cancer Other 53       maternal great grandmother    Social History   Socioeconomic History   Marital status: Single    Spouse name: Not on file   Number of children: 0   Years of education: Not on file   Highest education level: Not on file  Occupational History   Not on file  Tobacco Use   Smoking status: Never   Smokeless tobacco: Never  Vaping Use   Vaping status: Never Used  Substance and Sexual Activity   Alcohol use: Not Currently    Alcohol/week: 0.0 standard drinks of alcohol   Drug use: No   Sexual activity: Not Currently  Other Topics Concern   Not on file  Social History Narrative   She has brother, half brother & sister live nearby   Both parents deceased    Care managed by Anselm Pancoast   Social Determinants of Health   Financial Resource Strain: Low Risk  (02/11/2017)   Overall Financial Resource Strain (CARDIA)    Difficulty of Paying Living Expenses: Not hard at all  Food Insecurity: No Food Insecurity (02/11/2017)   Hunger Vital Sign    Worried About Running Out of Food in the Last Year: Never true    Ran Out of Food in the Last Year: Never true  Transportation Needs: No Transportation Needs (02/11/2017)   PRAPARE - Administrator, Civil Service (Medical): No    Lack of Transportation (Non-Medical): No  Physical Activity: Inactive (  02/11/2017)   Exercise Vital Sign    Days of Exercise per Week: 0 days    Minutes of Exercise per Session: 0 min  Stress: No Stress Concern Present (02/11/2017)   [employer] of Occupational Health - Occupational Stress Questionnaire    Feeling of Stress : Not at all  Social Connections: Unknown (02/11/2017)   Social Connection and Isolation Panel [NHANES]    Frequency of Communication with Friends and Family: Patient declined    Frequency of Social Gatherings with Friends and Family: Patient declined    Attends Religious Services: Patient declined    Database administrator or Organizations: Patient declined    Attends Banker Meetings: Patient declined    Marital Status: Patient declined  Intimate Partner Violence: Unknown (02/11/2017)   Humiliation, Afraid, Rape, and Kick questionnaire    Fear of Current or Ex-Partner: Patient declined    Emotionally Abused: Patient declined    Physically Abused: Patient declined    Sexually Abused: Patient declined      Review of Systems  Constitutional:  Positive for appetite change, chills and fatigue.  HENT:  Positive for congestion, postnasal drip, sinus pressure, sinus pain and sore throat.   Respiratory:  Positive for cough, chest tightness, shortness of breath and wheezing.   Cardiovascular: Negative.  Negative for chest pain and  palpitations.  Gastrointestinal:  Positive for nausea and vomiting. Negative for diarrhea.  Musculoskeletal:  Positive for myalgias.  Neurological:  Positive for dizziness, weakness and headaches.    Vital Signs: Resp 16   Ht 5\' 3"  (1.6 m)   LMP 12/06/2015   BMI 35.13 kg/m    Observation/Objective: She is alert and oriented. No acute distress noted. Voice sounds hoarse.    Assessment/Plan: 1. Acute bacterial bronchitis Antibiotic and cough syrup prescribed.  - amoxicillin-clavulanate (AUGMENTIN) 875-125 MG tablet; Take 1 tablet by mouth 2 (two) times daily for 10 days. Take with food  Dispense: 20 tablet; Refill: 0 - chlorpheniramine-HYDROcodone (TUSSIONEX) 10-8 MG/5ML; Take 5 mLs by mouth every 12 (twelve) hours as needed for cough.  Dispense: 140 mL; Refill: 0  2. Bilious vomiting with nausea Refill of promethazine ordered - promethazine (PHENERGAN) 25 MG suppository; Place 1 suppository (25 mg total) rectally every 6 (six) hours as needed for nausea or vomiting.  Dispense: 12 each; Refill: 0   General Counseling: Teresa Crawford verbalizes understanding of the findings of today's phone visit and agrees with plan of treatment. I have discussed any further diagnostic evaluation that may be needed or ordered today. We also reviewed her medications today. she has been encouraged to call the office with any questions or concerns that should arise related to todays visit.  Return if symptoms worsen or fail to improve.   No orders of the defined types were placed in this encounter.   Meds ordered this encounter  Medications   amoxicillin-clavulanate (AUGMENTIN) 875-125 MG tablet    Sig: Take 1 tablet by mouth 2 (two) times daily for 10 days. Take with food    Dispense:  20 tablet    Refill:  0   chlorpheniramine-HYDROcodone (TUSSIONEX) 10-8 MG/5ML    Sig: Take 5 mLs by mouth every 12 (twelve) hours as needed for cough.    Dispense:  140 mL    Refill:  0   promethazine (PHENERGAN)  25 MG suppository    Sig: Place 1 suppository (25 mg total) rectally every 6 (six) hours as needed for nausea or vomiting.    Dispense:  12 each    Refill:  0    Time spent:10 Minutes Time spent with patient included reviewing progress notes, labs, imaging studies, and discussing plan for follow up.  [place] Controlled Substance Database was reviewed by me for overdose risk score (ORS) if appropriate.  This patient was seen by Sallyanne Kuster, FNP-C in collaboration with Dr. Beverely Risen as a part of collaborative care agreement.  Arlone Lenhardt R. Tedd Sias, MSN, FNP-C Internal medicine

## 2022-12-13 ENCOUNTER — Encounter: Payer: Self-pay | Admitting: Nurse Practitioner

## 2022-12-14 ENCOUNTER — Telehealth: Payer: Self-pay | Admitting: Nurse Practitioner

## 2022-12-16 ENCOUNTER — Other Ambulatory Visit: Payer: Self-pay

## 2022-12-16 DIAGNOSIS — U099 Post covid-19 condition, unspecified: Secondary | ICD-10-CM

## 2022-12-16 MED ORDER — BENZONATATE 200 MG PO CAPS: 200.0000 mg | ORAL_CAPSULE | Freq: Two times a day (BID) | ORAL | 0 refills | Status: DC | PRN

## 2022-12-16 NOTE — Telephone Encounter (Signed)
Pt called that still not filling better still sob,coughing and as per alyssa that we send benzonatate  for cough  and finished  antibiotics advised her if symptoms go to urgent care or ED

## 2022-12-22 ENCOUNTER — Telehealth: Payer: Self-pay | Admitting: Nurse Practitioner

## 2022-12-22 NOTE — Telephone Encounter (Signed)
done

## 2022-12-22 NOTE — Telephone Encounter (Addendum)
FMLA completed. Emailed to [employer]; [email or web address]. Notified patient ready to pick up at front desk. Scanned-Teresa Crawford

## 2022-12-23 ENCOUNTER — Telehealth (INDEPENDENT_AMBULATORY_CARE_PROVIDER_SITE_OTHER): Payer: Medicare Other | Admitting: Nurse Practitioner

## 2022-12-23 ENCOUNTER — Encounter: Payer: Self-pay | Admitting: Nurse Practitioner

## 2022-12-23 VITALS — Ht 63.0 in | Wt 198.0 lb

## 2022-12-23 DIAGNOSIS — R1312 Dysphagia, oropharyngeal phase: Secondary | ICD-10-CM

## 2022-12-23 DIAGNOSIS — J329 Chronic sinusitis, unspecified: Secondary | ICD-10-CM | POA: Diagnosis not present

## 2022-12-23 DIAGNOSIS — U099 Post covid-19 condition, unspecified: Secondary | ICD-10-CM

## 2022-12-23 DIAGNOSIS — R0602 Shortness of breath: Secondary | ICD-10-CM

## 2022-12-23 DIAGNOSIS — G9331 Postviral fatigue syndrome: Secondary | ICD-10-CM | POA: Diagnosis not present

## 2022-12-23 DIAGNOSIS — R1114 Bilious vomiting: Secondary | ICD-10-CM

## 2022-12-23 DIAGNOSIS — R42 Dizziness and giddiness: Secondary | ICD-10-CM

## 2022-12-23 MED ORDER — BUDESONIDE-FORMOTEROL FUMARATE 160-4.5 MCG/ACT IN AERO: 2.0000 | INHALATION_SPRAY | Freq: Two times a day (BID) | RESPIRATORY_TRACT | 12 refills | Status: DC

## 2022-12-23 MED ORDER — COMBIVENT RESPIMAT 20-100 MCG/ACT IN AERS: 1.0000 | INHALATION_SPRAY | Freq: Four times a day (QID) | RESPIRATORY_TRACT | 6 refills | Status: DC | PRN

## 2022-12-23 NOTE — Progress Notes (Signed)
[facility] [address] [place], [place] [address]  Internal MEDICINE  Telephone Visit  Patient Name: Teresa Crawford  [id number]  [id number]  Date of Service: 12/23/2022  I connected with the patient at 1630 by telephone and verified the patients identity using two identifiers.   I discussed the limitations, risks, security and privacy concerns of performing an evaluation and management service by telephone and the availability of in person appointments. I also discussed with the patient that there may be a patient responsible charge related to the service.  The patient expressed understanding and agrees to proceed.    Chief Complaint  Patient presents with   Telephone Assessment   Telephone Screen    Difficulty  Swallowing    Headache   Dizziness    HPI Teasha presents for a telehealth virtual visit for feeling continued fatigue and having dysphagia and nausea.  Had COVID over 1 month ago and recently treated for bronchitis.  Through all of this, she has been having nausea, vomiting and dysphagia and fatigue. It has not improved per patient and she does not feel any better.  Dizziness has worsened, having SOB and had severe headache with pressure.    Current Medication: Outpatient Encounter Medications as of 12/23/2022  Medication Sig   acetaminophen (TYLENOL) 500 MG tablet 2 TABS PO BID PRN FOR HEADACHES AND PAINS   albuterol (VENTOLIN HFA) 108 (90 Base) MCG/ACT inhaler Inhale 2 puffs into the lungs every 6 (six) hours as needed for wheezing or shortness of breath.   benzonatate (TESSALON) 200 MG capsule Take 1 capsule (200 mg total) by mouth 2 (two) times daily as needed for cough.   budesonide-formoterol (SYMBICORT) 160-4.5 MCG/ACT inhaler Inhale 2 puffs into the lungs 2 (two) times daily.   buPROPion (WELLBUTRIN) 75 MG tablet Take 1 tablet (75 mg total) by mouth daily with breakfast.   cetirizine (ZYRTEC ALLERGY) 10 MG tablet Take 1 tablet (10 mg total) by  mouth daily.   chlorpheniramine-HYDROcodone (TUSSIONEX) 10-8 MG/5ML Take 5 mLs by mouth every 12 (twelve) hours as needed for cough.   docusate sodium (COLACE) 100 MG capsule TAKE 1 CAPSULE BY MOUTH ONCE DAILY FOR STOOL SOFTENER   EPINEPHrine 0.3 mg/0.3 mL IJ SOAJ injection USE FOR ANAPHYLAXIS   fluticasone (FLONASE) 50 MCG/ACT nasal spray Place 1 spray into both nostrils daily.   fluticasone (FLOVENT HFA) 220 MCG/ACT inhaler INHALE 2 PUFFS INTO THE LUNGS, 2 TIMES PER DAY. RINSE MOUTH AFTER INHALATIONS   Ipratropium-Albuterol (COMBIVENT RESPIMAT) 20-100 MCG/ACT AERS respimat Inhale 1 puff into the lungs every 6 (six) hours as needed for wheezing.   omeprazole (PRILOSEC) 40 MG capsule TAKE 1 CAPSULE BY MOUTH TWICE A DAY   ondansetron (ZOFRAN-ODT) 4 MG disintegrating tablet Take 1 tablet (4 mg total) by mouth every 8 (eight) hours as needed for nausea or vomiting.   promethazine (PHENERGAN) 25 MG suppository Place 1 suppository (25 mg total) rectally every 6 (six) hours as needed for nausea or vomiting.   tamsulosin (FLOMAX) 0.4 MG CAPS capsule    topiramate (TOPAMAX) 50 MG tablet TAKE 1 TABLET BY MOUTH EVERY EVENING   traMADol (ULTRAM) 50 MG tablet ONE TAB PO BID PRN FOR PAIN AND HEADCAHES   traZODone (DESYREL) 50 MG tablet Take 0.5 tablets (25 mg total) by mouth at bedtime.   triamcinolone cream (KENALOG) 0.1 % Apply 1 Application topically 2 (two) times daily as needed (itching and rash).   No facility-administered encounter medications on file as of 12/23/2022.  Surgical History: Past Surgical History:  Procedure Laterality Date   ABDOMINAL HYSTERECTOMY     BACK SURGERY     COLONOSCOPY  04/27/2006   Wohl-colitis, internal hemorrhoids   COLONOSCOPY  02/2013   focal right colitis, left colon biopsy negative, focal active proctitis without chronicity   COLONOSCOPY WITH PROPOFOL N/A 07/16/2022   Procedure: COLONOSCOPY WITH PROPOFOL;  Surgeon: Toney Reil, MD;  Location: [facility]  ENDOSCOPY;  Service: Gastroenterology;  Laterality: N/A;   ESOPHAGOGASTRODUODENOSCOPY  02/2013   multiple peptic & duodenal ulcers, negative h pylori   INTRAUTERINE DEVICE INSERTION  2012   LAPAROSCOPIC ENDOMETRIOSIS FULGURATION  2015   [name]   OVARIAN CYST REMOVAL      Medical History: Past Medical History:  Diagnosis Date   B12 deficiency anemia    Chronic back pain    Chronic fatigue syndrome    Constipation    Depression    Elevated liver enzymes    Endometriosis    Fibroids    Hepatic steatosis    Hypothyroid    Last menstrual period (LMP) > 10 days ago 09/12/15   Mental retardation    Migraine    Post traumatic stress disorder    Posttraumatic stress disorder    PUD (peptic ulcer disease)    Scoliosis    Splenomegaly    Uterine fibroid     Family History: Family History  Problem Relation Age of Onset   Breast cancer Mother 77   COPD Father    Lung cancer Father 34   Dementia Father    Diabetes Sister    Diabetes Brother    Ovarian cancer Paternal Aunt    Colon cancer Maternal Grandfather 63   Liver disease Maternal Aunt        ?etiology   Lymphoma Maternal Grandmother 73   Lung cancer Other 55       maternal great grandfather   Stomach cancer Other 32       maternal great grandmother    Social History   Socioeconomic History   Marital status: Single    Spouse name: Not on file   Number of children: 0   Years of education: Not on file   Highest education level: Not on file  Occupational History   Not on file  Tobacco Use   Smoking status: Never   Smokeless tobacco: Never  Vaping Use   Vaping status: Never Used  Substance and Sexual Activity   Alcohol use: Not Currently    Alcohol/week: 0.0 standard drinks of alcohol   Drug use: No   Sexual activity: Not Currently  Other Topics Concern   Not on file  Social History Narrative   She has brother, half brother & sister live nearby   Both parents deceased   Care managed by Anselm Pancoast    Social Determinants of Health   Financial Resource Strain: Low Risk  (02/11/2017)   Overall Financial Resource Strain (CARDIA)    Difficulty of Paying Living Expenses: Not hard at all  Food Insecurity: No Food Insecurity (02/11/2017)   Hunger Vital Sign    Worried About Running Out of Food in the Last Year: Never true    Ran Out of Food in the Last Year: Never true  Transportation Needs: No Transportation Needs (02/11/2017)   PRAPARE - Administrator, Civil Service (Medical): No    Lack of Transportation (Non-Medical): No  Physical Activity: Inactive (02/11/2017)   Exercise Vital Sign    Days of  Exercise per Week: 0 days    Minutes of Exercise per Session: 0 min  Stress: No Stress Concern Present (02/11/2017)   [employer] of Occupational Health - Occupational Stress Questionnaire    Feeling of Stress : Not at all  Social Connections: Unknown (02/11/2017)   Social Connection and Isolation Panel [NHANES]    Frequency of Communication with Friends and Family: Patient declined    Frequency of Social Gatherings with Friends and Family: Patient declined    Attends Religious Services: Patient declined    Database administrator or Organizations: Patient declined    Attends Banker Meetings: Patient declined    Marital Status: Patient declined  Intimate Partner Violence: Unknown (02/11/2017)   Humiliation, Afraid, Rape, and Kick questionnaire    Fear of Current or Ex-Partner: Patient declined    Emotionally Abused: Patient declined    Physically Abused: Patient declined    Sexually Abused: Patient declined      Review of Systems  Constitutional:  Positive for appetite change and fatigue.  HENT:  Positive for postnasal drip, sinus pain, sore throat and trouble swallowing. Negative for congestion, ear pain and rhinorrhea.   Respiratory:  Positive for cough and shortness of breath. Negative for chest tightness and wheezing.   Cardiovascular: Negative.   Negative for chest pain and palpitations.  Gastrointestinal:  Positive for nausea and vomiting.  Musculoskeletal: Negative.   Neurological:  Positive for dizziness and headaches.    Vital Signs: Ht 5\' 3"  (1.6 m)   Wt 198 lb (89.8 kg)   LMP 12/06/2015   BMI 35.07 kg/m    Observation/Objective: She is alert and oriented. No acute distress noted.     Assessment/Plan: 1. Post-COVID syndrome Symbicort prescribed for maintenance and combivent prescribed for rescue - Ipratropium-Albuterol (COMBIVENT RESPIMAT) 20-100 MCG/ACT AERS respimat; Inhale 1 puff into the lungs every 6 (six) hours as needed for wheezing.  Dispense: 4 g; Refill: 6 - budesonide-formoterol (SYMBICORT) 160-4.5 MCG/ACT inhaler; Inhale 2 puffs into the lungs 2 (two) times daily.  Dispense: 10.2 g; Refill: 12  2. SOB (shortness of breath) Symbicort prescribed for maintenance and combivent prescribed for rescue - Ipratropium-Albuterol (COMBIVENT RESPIMAT) 20-100 MCG/ACT AERS respimat; Inhale 1 puff into the lungs every 6 (six) hours as needed for wheezing.  Dispense: 4 g; Refill: 6 - budesonide-formoterol (SYMBICORT) 160-4.5 MCG/ACT inhaler; Inhale 2 puffs into the lungs 2 (two) times daily.  Dispense: 10.2 g; Refill: 12  3. Oropharyngeal dysphagia Referred to ENT - Ambulatory referral to ENT  4. Dizziness Referred to ENT - Ambulatory referral to ENT  5. Recurrent sinusitis Referred to ENT - Ambulatory referral to ENT  6. Postviral fatigue syndrome Lingering symptoms continue  7. Bilious vomiting with nausea Continue phenergan or zofran as needed    General Counseling: Lake verbalizes understanding of the findings of today's phone visit and agrees with plan of treatment. I have discussed any further diagnostic evaluation that may be needed or ordered today. We also reviewed her medications today. she has been encouraged to call the office with any questions or concerns that should arise related to todays  visit.  Return if symptoms worsen or fail to improve.   Orders Placed This Encounter  Procedures   Ambulatory referral to ENT    Meds ordered this encounter  Medications   Ipratropium-Albuterol (COMBIVENT RESPIMAT) 20-100 MCG/ACT AERS respimat    Sig: Inhale 1 puff into the lungs every 6 (six) hours as needed for wheezing.    Dispense:  4 g    Refill:  6   budesonide-formoterol (SYMBICORT) 160-4.5 MCG/ACT inhaler    Sig: Inhale 2 puffs into the lungs 2 (two) times daily.    Dispense:  10.2 g    Refill:  12    Time spent:10 Minutes Time spent with patient included reviewing progress notes, labs, imaging studies, and discussing plan for follow up.  [place] Controlled Substance Database was reviewed by me for overdose risk score (ORS) if appropriate.  This patient was seen by Sallyanne Kuster, FNP-C in collaboration with Dr. Beverely Risen as a part of collaborative care agreement.  Ciclaly Mulcahey R. Tedd Sias, MSN, FNP-C Internal medicine

## 2022-12-24 ENCOUNTER — Telehealth: Payer: Self-pay | Admitting: Nurse Practitioner

## 2022-12-24 NOTE — Telephone Encounter (Signed)
Otolaryngology appointment 01/18/2023 @ [facility] -Sheralyn Boatman

## 2022-12-24 NOTE — Telephone Encounter (Signed)
Otolaryngology referral sent via [facility] to [facility]. Notified patient. Gave pt telephone # [phone]

## 2022-12-28 DIAGNOSIS — Z79899 Other long term (current) drug therapy: Secondary | ICD-10-CM | POA: Diagnosis not present

## 2022-12-28 DIAGNOSIS — E538 Deficiency of other specified B group vitamins: Secondary | ICD-10-CM | POA: Diagnosis not present

## 2022-12-28 DIAGNOSIS — E559 Vitamin D deficiency, unspecified: Secondary | ICD-10-CM | POA: Diagnosis not present

## 2022-12-28 DIAGNOSIS — R5383 Other fatigue: Secondary | ICD-10-CM | POA: Diagnosis not present

## 2022-12-29 LAB — CMP14+EGFR
ALT: 12 IU/L (ref 0–32)
AST: 12 IU/L (ref 0–40)
Albumin: 4 g/dL (ref 3.9–4.9)
Alkaline Phosphatase: 134 IU/L — ABNORMAL HIGH (ref 44–121)
BUN/Creatinine Ratio: 13 (ref 9–23)
BUN: 9 mg/dL (ref 6–20)
Bilirubin Total: 0.4 mg/dL (ref 0.0–1.2)
CO2: 19 mmol/L — ABNORMAL LOW (ref 20–29)
Calcium: 9.2 mg/dL (ref 8.7–10.2)
Chloride: 108 mmol/L — ABNORMAL HIGH (ref 96–106)
Creatinine, Ser: 0.67 mg/dL (ref 0.57–1.00)
Globulin, Total: 3.3 g/dL (ref 1.5–4.5)
Glucose: 102 mg/dL — ABNORMAL HIGH (ref 70–99)
Potassium: 3.9 mmol/L (ref 3.5–5.2)
Sodium: 142 mmol/L (ref 134–144)
Total Protein: 7.3 g/dL (ref 6.0–8.5)
eGFR: 115 mL/min/{1.73_m2} (ref >59)

## 2022-12-29 LAB — IRON,TIBC AND FERRITIN PANEL
Ferritin: 158 ng/mL — ABNORMAL HIGH (ref 15–150)
Iron Saturation: 35 % (ref 15–55)
Iron: 80 ug/dL (ref 27–159)
Total Iron Binding Capacity: 230 ug/dL — ABNORMAL LOW (ref 250–450)
UIBC: 150 ug/dL (ref 131–425)

## 2022-12-29 LAB — CBC WITH DIFFERENTIAL/PLATELET
Basophils Absolute: 0 10*3/uL (ref 0.0–0.2)
Basos: 0 %
EOS (ABSOLUTE): 0.1 10*3/uL (ref 0.0–0.4)
Eos: 1 %
Hematocrit: 43.6 % (ref 34.0–46.6)
Hemoglobin: 13.9 g/dL (ref 11.1–15.9)
Immature Grans (Abs): 0 10*3/uL (ref 0.0–0.1)
Immature Granulocytes: 0 %
Lymphocytes Absolute: 1.6 10*3/uL (ref 0.7–3.1)
Lymphs: 28 %
MCH: 27.4 pg (ref 26.6–33.0)
MCHC: 31.9 g/dL (ref 31.5–35.7)
MCV: 86 fL (ref 79–97)
Monocytes Absolute: 0.3 10*3/uL (ref 0.1–0.9)
Monocytes: 5 %
Neutrophils Absolute: 3.6 10*3/uL (ref 1.4–7.0)
Neutrophils: 66 %
Platelets: 196 10*3/uL (ref 150–450)
RBC: 5.07 x10E6/uL (ref 3.77–5.28)
RDW: 13.8 % (ref 11.7–15.4)
WBC: 5.5 10*3/uL (ref 3.4–10.8)

## 2022-12-29 LAB — TSH+FREE T4
Free T4: 1.08 ng/dL (ref 0.82–1.77)
TSH: 3.06 u[IU]/mL (ref 0.450–4.500)

## 2022-12-29 LAB — B12 AND FOLATE PANEL
Folate: 5.3 ng/mL (ref >3.0)
Vitamin B-12: 347 pg/mL (ref 232–1245)

## 2022-12-29 LAB — VITAMIN D 25 HYDROXY (VIT D DEFICIENCY, FRACTURES): Vit D, 25-Hydroxy: 11.5 ng/mL — ABNORMAL LOW (ref 30.0–100.0)

## 2023-01-07 ENCOUNTER — Ambulatory Visit
Admission: RE | Admit: 2023-01-07 | Discharge: 2023-01-07 | Disposition: A | Discharge status: Home / Self Care | Payer: Medicare Other | Attending: Nurse Practitioner | Admitting: Nurse Practitioner

## 2023-01-07 ENCOUNTER — Ambulatory Visit
Admission: RE | Admit: 2023-01-07 | Discharge: 2023-01-07 | Disposition: A | Discharge status: Home / Self Care | Payer: Medicare Other | Source: Ambulatory Visit | Attending: Nurse Practitioner | Admitting: Nurse Practitioner

## 2023-01-07 ENCOUNTER — Telehealth: Payer: Self-pay

## 2023-01-07 ENCOUNTER — Ambulatory Visit (INDEPENDENT_AMBULATORY_CARE_PROVIDER_SITE_OTHER): Payer: Medicare Other | Admitting: Nurse Practitioner

## 2023-01-07 ENCOUNTER — Encounter: Payer: Self-pay | Admitting: Nurse Practitioner

## 2023-01-07 VITALS — BP 120/88 | HR 98 | Temp 98.1°F | Resp 16 | Ht 63.0 in | Wt 201.0 lb

## 2023-01-07 DIAGNOSIS — Z760 Encounter for issue of repeat prescription: Secondary | ICD-10-CM

## 2023-01-07 DIAGNOSIS — R0602 Shortness of breath: Secondary | ICD-10-CM | POA: Insufficient documentation

## 2023-01-07 DIAGNOSIS — E559 Vitamin D deficiency, unspecified: Secondary | ICD-10-CM

## 2023-01-07 DIAGNOSIS — Z23 Encounter for immunization: Secondary | ICD-10-CM

## 2023-01-07 DIAGNOSIS — U099 Post covid-19 condition, unspecified: Secondary | ICD-10-CM | POA: Insufficient documentation

## 2023-01-07 DIAGNOSIS — R059 Cough, unspecified: Secondary | ICD-10-CM | POA: Diagnosis not present

## 2023-01-07 DIAGNOSIS — Z0001 Encounter for general adult medical examination with abnormal findings: Secondary | ICD-10-CM | POA: Diagnosis not present

## 2023-01-07 DIAGNOSIS — J301 Allergic rhinitis due to pollen: Secondary | ICD-10-CM | POA: Diagnosis not present

## 2023-01-07 DIAGNOSIS — Z79899 Other long term (current) drug therapy: Secondary | ICD-10-CM | POA: Diagnosis not present

## 2023-01-07 DIAGNOSIS — E538 Deficiency of other specified B group vitamins: Secondary | ICD-10-CM | POA: Diagnosis not present

## 2023-01-07 MED ORDER — FLUTICASONE PROPIONATE 50 MCG/ACT NA SUSP
1.0000 | Freq: Every day | NASAL | 5 refills | Status: DC
Start: 2023-01-07 — End: 2023-04-27

## 2023-01-07 MED ORDER — VITAMIN D (ERGOCALCIFEROL) 1.25 MG (50000 UNIT) PO CAPS: 50000.0000 [IU] | ORAL_CAPSULE | ORAL | 1 refills | Status: DC

## 2023-01-07 MED ORDER — CETIRIZINE HCL 10 MG PO TABS
10.0000 mg | ORAL_TABLET | Freq: Every day | ORAL | 1 refills | Status: DC
Start: 2023-01-07 — End: 2023-05-24

## 2023-01-07 MED ORDER — EPINEPHRINE 0.3 MG/0.3ML IJ SOAJ
INTRAMUSCULAR | 5 refills | Status: DC
Start: 2023-01-07 — End: 2024-01-13

## 2023-01-07 MED ORDER — TRIAMCINOLONE ACETONIDE 0.1 % EX CREA
1.0000 | TOPICAL_CREAM | Freq: Two times a day (BID) | CUTANEOUS | 1 refills | Status: AC | PRN
Start: 2023-01-07 — End: ?

## 2023-01-07 MED ORDER — DOCUSATE SODIUM 100 MG PO CAPS
ORAL_CAPSULE | ORAL | 1 refills | Status: DC
Start: 2023-01-07 — End: 2023-07-01

## 2023-01-07 MED ORDER — ACETAMINOPHEN 500 MG PO TABS
ORAL_TABLET | ORAL | 5 refills | Status: DC
Start: 2023-01-07 — End: 2023-03-24

## 2023-01-07 MED ORDER — VITAMIN B-12 1000 MCG PO TABS
1000.0000 ug | ORAL_TABLET | Freq: Every day | ORAL | 2 refills | Status: DC
Start: 2023-01-07 — End: 2023-08-25

## 2023-01-07 MED ORDER — ALBUTEROL SULFATE HFA 108 (90 BASE) MCG/ACT IN AERS: 2.0000 | INHALATION_SPRAY | Freq: Four times a day (QID) | RESPIRATORY_TRACT | 5 refills | Status: DC | PRN

## 2023-01-07 MED ORDER — TETANUS-DIPHTH-ACELL PERTUSSIS 5-2.5-18.5 LF-MCG/0.5 IM SUSP
0.5000 mL | Freq: Once | INTRAMUSCULAR | 0 refills | Status: AC
Start: 2023-01-07 — End: 2023-01-07

## 2023-01-07 NOTE — Telephone Encounter (Signed)
Patient notified

## 2023-01-07 NOTE — Progress Notes (Signed)
Chest xray normal, negative for infection or other acute abnormalities

## 2023-01-07 NOTE — Progress Notes (Signed)
[facility] [address] [place], [place] [address]  Internal MEDICINE  Office Visit Note  Patient Name: Teresa Crawford  [id number]  [id number]  Date of Service: 01/07/2023  Chief Complaint  Patient presents with   Depression   Medicare Wellness    HPI Teresa Crawford presents for an annual well visit and physical exam.  Well-appearing 37 y.o. female with migraines, hypothyroidism, PTSD, depression, intellectual disability, and allergic rhinitis.  Routine mammogram: due at the end of october Pap smear: due in 2025 Labs: labs were done in late September Very low vitamin D level at 11.5 Low normal B12 at 347 Slightly high ferritin, nonspecific, just monitor Elevated alk phos and chloride, may need to repeat CBC and thyroid labs are normal New or worsening pain: none  Other concerns: still having intermittent nausea and vomiting, fatigue      01/07/2023   10:00 AM 01/01/2022    9:59 AM 12/25/2020   10:12 AM  MMSE - Mini Mental State Exam  Orientation to time 5 5 5   Orientation to Place 5 5 5   Registration 3 3 3   Attention/ Calculation 5 5 5   Recall 3 3 3   Language- name 2 objects 2 2 2   Language- repeat 1 1 1   Language- follow 3 step command 3 3 3   Language- read & follow direction 1 1 1   Write a sentence 0 0 0  Copy design 1 1 1   Total score 29 29 29     Functional Status Survey: Is the patient deaf or have difficulty hearing?: No Does the patient have difficulty seeing, even when wearing glasses/contacts?: No Does the patient have difficulty concentrating, remembering, or making decisions?: No Does the patient have difficulty walking or climbing stairs?: No Does the patient have difficulty dressing or bathing?: No Does the patient have difficulty doing errands alone such as visiting a doctor's office or shopping?: No     07/17/2021   11:36 AM 09/03/2021    1:41 PM 01/01/2022    9:57 AM 04/30/2022    9:44 AM 01/07/2023    9:59 AM  Fall Risk  Falls in the  past year?  0 0 0 0  Was there an injury with Fall?   0 0 0  Fall Risk Category Calculator   0 0 0  Fall Risk Category (Retired)   Low    (RETIRED) Patient Fall Risk Level Low fall risk Low fall risk Low fall risk    Patient at Risk for Falls Due to  No Fall Risks No Fall Risks No Fall Risks No Fall Risks  Fall risk Follow up  Falls evaluation completed Falls evaluation completed Falls evaluation completed Falls evaluation completed       02/24/2023   11:33 AM  Depression screen PHQ 2/9  Decreased Interest   Down, Depressed, Hopeless   PHQ - 2 Score   Altered sleeping   Tired, decreased energy   Change in appetite   Feeling bad or failure about yourself    Trouble concentrating   Moving slowly or fidgety/restless   Suicidal thoughts   PHQ-9 Score   Difficult doing work/chores      Information is confidential and restricted. Go to Review Flowsheets to unlock data.       02/24/2023   11:34 AM 04/21/2022    4:12 PM 01/20/2022   10:33 AM 10/22/2021   10:08 AM  GAD 7 : Generalized Anxiety Score  Nervous, Anxious, on Edge      Control/stop  worrying      Worry too much - different things      Trouble relaxing      Restless      Easily annoyed or irritable      Afraid - awful might happen      Total GAD 7 Score      Anxiety Difficulty         Information is confidential and restricted. Go to Review Flowsheets to unlock data.      Current Medication: Outpatient Encounter Medications as of 01/07/2023  Medication Sig   budesonide-formoterol (SYMBICORT) 160-4.5 MCG/ACT inhaler Inhale 2 puffs into the lungs 2 (two) times daily.   buPROPion (WELLBUTRIN) 75 MG tablet Take 1 tablet (75 mg total) by mouth daily with breakfast.   cyanocobalamin (VITAMIN B12) 1000 MCG tablet Take 1 tablet (1,000 mcg total) by mouth daily.   fluticasone (FLOVENT HFA) 220 MCG/ACT inhaler INHALE 2 PUFFS INTO THE LUNGS, 2 TIMES PER DAY. RINSE MOUTH AFTER INHALATIONS   Ipratropium-Albuterol (COMBIVENT  RESPIMAT) 20-100 MCG/ACT AERS respimat Inhale 1 puff into the lungs every 6 (six) hours as needed for wheezing.   omeprazole (PRILOSEC) 40 MG capsule TAKE 1 CAPSULE BY MOUTH TWICE A DAY   ondansetron (ZOFRAN-ODT) 4 MG disintegrating tablet Take 1 tablet (4 mg total) by mouth every 8 (eight) hours as needed for nausea or vomiting.   promethazine (PHENERGAN) 25 MG suppository Place 1 suppository (25 mg total) rectally every 6 (six) hours as needed for nausea or vomiting.   tamsulosin (FLOMAX) 0.4 MG CAPS capsule    [EXPIRED] Tdap (BOOSTRIX) 5-2.5-18.5 LF-MCG/0.5 injection Inject 0.5 mLs into the muscle once for 1 dose.   topiramate (TOPAMAX) 50 MG tablet TAKE 1 TABLET BY MOUTH EVERY EVENING   traMADol (ULTRAM) 50 MG tablet ONE TAB PO BID PRN FOR PAIN AND HEADCAHES   Vitamin D, Ergocalciferol, (DRISDOL) 1.25 MG (50000 UNIT) CAPS capsule Take 1 capsule (50,000 Units total) by mouth every 7 (seven) days.   [DISCONTINUED] acetaminophen (TYLENOL) 500 MG tablet 2 TABS PO BID PRN FOR HEADACHES AND PAINS   [DISCONTINUED] albuterol (VENTOLIN HFA) 108 (90 Base) MCG/ACT inhaler Inhale 2 puffs into the lungs every 6 (six) hours as needed for wheezing or shortness of breath.   [DISCONTINUED] benzonatate (TESSALON) 200 MG capsule Take 1 capsule (200 mg total) by mouth 2 (two) times daily as needed for cough.   [DISCONTINUED] cetirizine (ZYRTEC ALLERGY) 10 MG tablet Take 1 tablet (10 mg total) by mouth daily.   [DISCONTINUED] chlorpheniramine-HYDROcodone (TUSSIONEX) 10-8 MG/5ML Take 5 mLs by mouth every 12 (twelve) hours as needed for cough.   [DISCONTINUED] docusate sodium (COLACE) 100 MG capsule TAKE 1 CAPSULE BY MOUTH ONCE DAILY FOR STOOL SOFTENER   [DISCONTINUED] EPINEPHrine 0.3 mg/0.3 mL IJ SOAJ injection USE FOR ANAPHYLAXIS   [DISCONTINUED] fluticasone (FLONASE) 50 MCG/ACT nasal spray Place 1 spray into both nostrils daily.   [DISCONTINUED] traZODone (DESYREL) 50 MG tablet Take 0.5 tablets (25 mg total) by  mouth at bedtime.   [DISCONTINUED] triamcinolone cream (KENALOG) 0.1 % Apply 1 Application topically 2 (two) times daily as needed (itching and rash).   acetaminophen (TYLENOL) 500 MG tablet 2 TABS PO BID PRN FOR HEADACHES AND PAINS   albuterol (VENTOLIN HFA) 108 (90 Base) MCG/ACT inhaler Inhale 2 puffs into the lungs every 6 (six) hours as needed for wheezing or shortness of breath.   cetirizine (ZYRTEC ALLERGY) 10 MG tablet Take 1 tablet (10 mg total) by mouth daily.   docusate  sodium (COLACE) 100 MG capsule TAKE 1 CAPSULE BY MOUTH ONCE DAILY FOR STOOL SOFTENER   EPINEPHrine 0.3 mg/0.3 mL IJ SOAJ injection USE FOR ANAPHYLAXIS   fluticasone (FLONASE) 50 MCG/ACT nasal spray Place 1 spray into both nostrils daily.   triamcinolone cream (KENALOG) 0.1 % Apply 1 Application topically 2 (two) times daily as needed (itching and rash).   No facility-administered encounter medications on file as of 01/07/2023.    Surgical History: Past Surgical History:  Procedure Laterality Date   ABDOMINAL HYSTERECTOMY     BACK SURGERY     COLONOSCOPY  04/27/2006   Wohl-colitis, internal hemorrhoids   COLONOSCOPY  02/2013   focal right colitis, left colon biopsy negative, focal active proctitis without chronicity   COLONOSCOPY WITH PROPOFOL N/A 07/16/2022   Procedure: COLONOSCOPY WITH PROPOFOL;  Surgeon: Toney Reil, MD;  Location: [facility] ENDOSCOPY;  Service: Gastroenterology;  Laterality: N/A;   ESOPHAGOGASTRODUODENOSCOPY  02/2013   multiple peptic & duodenal ulcers, negative h pylori   INTRAUTERINE DEVICE INSERTION  2012   LAPAROSCOPIC ENDOMETRIOSIS FULGURATION  2015   [name]   OVARIAN CYST REMOVAL      Medical History: Past Medical History:  Diagnosis Date   B12 deficiency anemia    Chronic back pain    Chronic fatigue syndrome    Constipation    Depression    Elevated liver enzymes    Endometriosis    Fibroids    Hepatic steatosis    Hypothyroid    Last menstrual period (LMP) > 10 days  ago 09/12/15   Mental retardation    Migraine    Post traumatic stress disorder    Posttraumatic stress disorder    PUD (peptic ulcer disease)    Scoliosis    Splenomegaly    Uterine fibroid     Family History: Family History  Problem Relation Age of Onset   Breast cancer Mother 43   COPD Father    Lung cancer Father 65   Dementia Father    Diabetes Sister    Diabetes Brother    Ovarian cancer Paternal Aunt    Colon cancer Maternal Grandfather 35   Liver disease Maternal Aunt        ?etiology   Lymphoma Maternal Grandmother 43   Lung cancer Other 70       maternal great grandfather   Stomach cancer Other 38       maternal great grandmother    Social History   Socioeconomic History   Marital status: Single    Spouse name: Not on file   Number of children: 0   Years of education: Not on file   Highest education level: Not on file  Occupational History   Not on file  Tobacco Use   Smoking status: Never   Smokeless tobacco: Never  Vaping Use   Vaping status: Never Used  Substance and Sexual Activity   Alcohol use: Not Currently    Alcohol/week: 0.0 standard drinks of alcohol   Drug use: No   Sexual activity: Not Currently  Other Topics Concern   Not on file  Social History Narrative   She has brother, half brother & sister live nearby   Both parents deceased   Care managed by Anselm Pancoast   Social Determinants of Health   Financial Resource Strain: Low Risk  (02/11/2017)   Overall Financial Resource Strain (CARDIA)    Difficulty of Paying Living Expenses: Not hard at all  Food Insecurity: No Food Insecurity (02/11/2017)  Hunger Vital Sign    Worried About Running Out of Food in the Last Year: Never true    Ran Out of Food in the Last Year: Never true  Transportation Needs: No Transportation Needs (02/11/2017)   PRAPARE - Administrator, Civil Service (Medical): No    Lack of Transportation (Non-Medical): No  Physical Activity: Inactive  (02/11/2017)   Exercise Vital Sign    Days of Exercise per Week: 0 days    Minutes of Exercise per Session: 0 min  Stress: No Stress Concern Present (02/11/2017)   [employer] of Occupational Health - Occupational Stress Questionnaire    Feeling of Stress : Not at all  Social Connections: Unknown (02/11/2017)   Social Connection and Isolation Panel [NHANES]    Frequency of Communication with Friends and Family: Patient declined    Frequency of Social Gatherings with Friends and Family: Patient declined    Attends Religious Services: Patient declined    Database administrator or Organizations: Patient declined    Attends Banker Meetings: Patient declined    Marital Status: Patient declined  Intimate Partner Violence: Unknown (02/11/2017)   Humiliation, Afraid, Rape, and Kick questionnaire    Fear of Current or Ex-Partner: Patient declined    Emotionally Abused: Patient declined    Physically Abused: Patient declined    Sexually Abused: Patient declined      Review of Systems  Constitutional:  Positive for appetite change and fatigue. Negative for activity change, chills, fever and unexpected weight change.  HENT:  Positive for congestion and postnasal drip. Negative for ear pain, rhinorrhea, sinus pain, sore throat and trouble swallowing.   Eyes: Negative.   Respiratory:  Positive for cough and shortness of breath. Negative for chest tightness and wheezing.   Cardiovascular: Negative.  Negative for chest pain and palpitations.  Gastrointestinal:  Positive for nausea and vomiting. Negative for abdominal pain, blood in stool, constipation and diarrhea.  Endocrine: Negative.   Genitourinary: Negative.  Negative for difficulty urinating, dysuria, frequency, hematuria and urgency.  Musculoskeletal: Negative.  Negative for arthralgias, back pain, joint swelling, myalgias and neck pain.  Skin: Negative.  Negative for rash and wound.  Allergic/Immunologic: Negative.   Negative for immunocompromised state.  Neurological:  Positive for dizziness and headaches. Negative for seizures and numbness.  Hematological: Negative.   Psychiatric/Behavioral:  Negative for behavioral problems, self-injury and suicidal ideas. The patient is not nervous/anxious.     Vital Signs: BP 120/88   Pulse 98   Temp 98.1 F (36.7 C)   Resp 16   Ht 5\' 3"  (1.6 m)   Wt 201 lb (91.2 kg)   LMP 12/06/2015   SpO2 98%   BMI 35.61 kg/m    Physical Exam Vitals reviewed.  Constitutional:      General: She is awake. She is not in acute distress.    Appearance: Normal appearance. She is well-developed and well-groomed. She is obese. She is not ill-appearing or diaphoretic.  HENT:     Head: Normocephalic and atraumatic.     Right Ear: Tympanic membrane, ear canal and external ear normal.     Left Ear: Tympanic membrane, ear canal and external ear normal.     Nose: Nose normal. No congestion or rhinorrhea.     Mouth/Throat:     Lips: Pink.     Mouth: Mucous membranes are moist.     Pharynx: Oropharynx is clear. Uvula midline. No oropharyngeal exudate or posterior oropharyngeal erythema.  Eyes:  General: Lids are normal. Vision grossly intact. Gaze aligned appropriately. No scleral icterus.       Right eye: No discharge.        Left eye: No discharge.     Extraocular Movements: Extraocular movements intact.     Conjunctiva/sclera: Conjunctivae normal.     Pupils: Pupils are equal, round, and reactive to light.     Funduscopic exam:    Right eye: Red reflex present.        Left eye: Red reflex present. Neck:     Thyroid: No thyromegaly.     Vascular: No carotid bruit or JVD.     Trachea: Trachea and phonation normal. No tracheal deviation.  Cardiovascular:     Rate and Rhythm: Normal rate and regular rhythm.     Pulses: Normal pulses.     Heart sounds: Normal heart sounds, S1 normal and S2 normal. No murmur heard.    No friction rub. No gallop.  Pulmonary:      Effort: Pulmonary effort is normal. No accessory muscle usage or respiratory distress.     Breath sounds: Normal breath sounds and air entry. No stridor. No wheezing or rales.  Chest:     Chest wall: No tenderness.  Abdominal:     General: Bowel sounds are normal. There is no distension.     Palpations: Abdomen is soft. There is no shifting dullness, fluid wave, mass or pulsatile mass.     Tenderness: There is no abdominal tenderness. There is no guarding or rebound.  Musculoskeletal:        General: No tenderness or deformity. Normal range of motion.     Cervical back: Normal range of motion and neck supple.     Right lower leg: No edema.     Left lower leg: No edema.  Lymphadenopathy:     Cervical: No cervical adenopathy.  Skin:    General: Skin is warm and dry.     Capillary Refill: Capillary refill takes less than 2 seconds.     Coloration: Skin is not pale.     Findings: No erythema or rash.  Neurological:     Mental Status: She is alert and oriented to person, place, and time.     Cranial Nerves: No cranial nerve deficit.     Motor: No abnormal muscle tone.     Coordination: Coordination normal.     Gait: Gait normal.     Deep Tendon Reflexes: Reflexes are normal and symmetric.  Psychiatric:        Mood and Affect: Mood and affect normal.        Behavior: Behavior normal. Behavior is cooperative.        Thought Content: Thought content normal.        Judgment: Judgment normal.        Assessment/Plan: 1. Encounter for routine adult health examination with abnormal findings Age-appropriate preventive screenings and vaccinations discussed, annual physical exam completed. Routine labs for health maintenance drawn and results discussed today. PHM updated.   2. Seasonal allergic rhinitis due to pollen Medication refills ordered, continue as prescribed.  - fluticasone (FLONASE) 50 MCG/ACT nasal spray; Place 1 spray into both nostrils daily.  Dispense: 16 g; Refill: 5 -  cetirizine (ZYRTEC ALLERGY) 10 MG tablet; Take 1 tablet (10 mg total) by mouth daily.  Dispense: 90 tablet; Refill: 1  3. Post-COVID syndrome Chest xray ordered  - DG Chest 2 View; Future  4. Shortness of breath Chest xray ordered  -  DG Chest 2 View; Future  5. B12 deficiency Start B12 supplement as prescribed.  - cyanocobalamin (VITAMIN B12) 1000 MCG tablet; Take 1 tablet (1,000 mcg total) by mouth daily.  Dispense: 90 tablet; Refill: 2  6. Vitamin D deficiency Take weekly prescription supplement as prescribed. Will repeat lab in 6 months  - Vitamin D, Ergocalciferol, (DRISDOL) 1.25 MG (50000 UNIT) CAPS capsule; Take 1 capsule (50,000 Units total) by mouth every 7 (seven) days.  Dispense: 12 capsule; Refill: 1  7. Encounter for medication review Medication list reviewed, updated and refills ordered  - acetaminophen (TYLENOL) 500 MG tablet; 2 TABS PO BID PRN FOR HEADACHES AND PAINS  Dispense: 45 tablet; Refill: 5 - docusate sodium (COLACE) 100 MG capsule; TAKE 1 CAPSULE BY MOUTH ONCE DAILY FOR STOOL SOFTENER  Dispense: 90 capsule; Refill: 1 - EPINEPHrine 0.3 mg/0.3 mL IJ SOAJ injection; USE FOR ANAPHYLAXIS  Dispense: 1 each; Refill: 5 - triamcinolone cream (KENALOG) 0.1 %; Apply 1 Application topically 2 (two) times daily as needed (itching and rash).  Dispense: 453.6 g; Refill: 1 - albuterol (VENTOLIN HFA) 108 (90 Base) MCG/ACT inhaler; Inhale 2 puffs into the lungs every 6 (six) hours as needed for wheezing or shortness of breath.  Dispense: 8 g; Refill: 5  8. Need for vaccination Fu vaccine administered in office today Tdap ordered and sent to pharmacy - Influenza, MDCK, trivalent, PF(Flucelvax egg-free) - Tdap (BOOSTRIX) 5-2.5-18.5 LF-MCG/0.5 injection; Inject 0.5 mLs into the muscle once for 1 dose.  Dispense: 0.5 mL; Refill: 0     General Counseling: Teresa Crawford verbalizes understanding of the findings of todays visit and agrees with plan of treatment. I have discussed any further  diagnostic evaluation that may be needed or ordered today. We also reviewed her medications today. she has been encouraged to call the office with any questions or concerns that should arise related to todays visit.    Orders Placed This Encounter  Procedures   DG Chest 2 View   Influenza, MDCK, trivalent, PF(Flucelvax egg-free)    Meds ordered this encounter  Medications   Tdap (BOOSTRIX) 5-2.5-18.5 LF-MCG/0.5 injection    Sig: Inject 0.5 mLs into the muscle once for 1 dose.    Dispense:  0.5 mL    Refill:  0   Vitamin D, Ergocalciferol, (DRISDOL) 1.25 MG (50000 UNIT) CAPS capsule    Sig: Take 1 capsule (50,000 Units total) by mouth every 7 (seven) days.    Dispense:  12 capsule    Refill:  1   cyanocobalamin (VITAMIN B12) 1000 MCG tablet    Sig: Take 1 tablet (1,000 mcg total) by mouth daily.    Dispense:  90 tablet    Refill:  2   acetaminophen (TYLENOL) 500 MG tablet    Sig: 2 TABS PO BID PRN FOR HEADACHES AND PAINS    Dispense:  45 tablet    Refill:  5   docusate sodium (COLACE) 100 MG capsule    Sig: TAKE 1 CAPSULE BY MOUTH ONCE DAILY FOR STOOL SOFTENER    Dispense:  90 capsule    Refill:  1    FOR FUTURE USE THANKS   EPINEPHrine 0.3 mg/0.3 mL IJ SOAJ injection    Sig: USE FOR ANAPHYLAXIS    Dispense:  1 each    Refill:  5    WILL NEED REFILLS AUTHORIZED IF PATIENT IS TO CONTINUE TOHAVE THIS IN AN EMERGENCY.  THANKS   triamcinolone cream (KENALOG) 0.1 %    Sig: Apply 1  Application topically 2 (two) times daily as needed (itching and rash).    Dispense:  453.6 g    Refill:  1    For future refills   fluticasone (FLONASE) 50 MCG/ACT nasal spray    Sig: Place 1 spray into both nostrils daily.    Dispense:  16 g    Refill:  5   cetirizine (ZYRTEC ALLERGY) 10 MG tablet    Sig: Take 1 tablet (10 mg total) by mouth daily.    Dispense:  90 tablet    Refill:  1   albuterol (VENTOLIN HFA) 108 (90 Base) MCG/ACT inhaler    Sig: Inhale 2 puffs into the lungs every 6 (six)  hours as needed for wheezing or shortness of breath.    Dispense:  8 g    Refill:  5    Return in about 4 months (around 05/10/2023) for F/U, Meckenzie Balsley PCP.   Total time spent:30 Minutes Time spent includes review of chart, medications, test results, and follow up plan with the patient.   [place] Controlled Substance Database was reviewed by me.  This patient was seen by Sallyanne Kuster, FNP-C in collaboration with Dr. Beverely Risen as a part of collaborative care agreement.  Earnestene Angello R. Tedd Sias, MSN, FNP-C Internal medicine

## 2023-01-07 NOTE — Telephone Encounter (Signed)
-----   Message from Mora sent at 01/07/2023 12:21 PM EDT ----- Chest xray normal, negative for infection or other acute abnormalities

## 2023-01-08 ENCOUNTER — Telehealth: Payer: Self-pay

## 2023-01-08 NOTE — Telephone Encounter (Signed)
-----   Message from Mora sent at 01/07/2023 12:21 PM EDT ----- Chest xray normal, negative for infection or other acute abnormalities

## 2023-01-08 NOTE — Telephone Encounter (Signed)
Pt advised that chest xray is normal

## 2023-01-18 DIAGNOSIS — R1310 Dysphagia, unspecified: Secondary | ICD-10-CM | POA: Diagnosis not present

## 2023-01-18 DIAGNOSIS — J329 Chronic sinusitis, unspecified: Secondary | ICD-10-CM | POA: Diagnosis not present

## 2023-01-18 DIAGNOSIS — R42 Dizziness and giddiness: Secondary | ICD-10-CM | POA: Diagnosis not present

## 2023-01-19 ENCOUNTER — Other Ambulatory Visit: Payer: Self-pay | Admitting: Unknown Physician Specialty

## 2023-01-19 DIAGNOSIS — J329 Chronic sinusitis, unspecified: Secondary | ICD-10-CM

## 2023-01-26 ENCOUNTER — Telehealth: Payer: Self-pay | Admitting: Nurse Practitioner

## 2023-01-27 DIAGNOSIS — H8111 Benign paroxysmal vertigo, right ear: Secondary | ICD-10-CM | POA: Diagnosis not present

## 2023-02-01 ENCOUNTER — Telehealth (INDEPENDENT_AMBULATORY_CARE_PROVIDER_SITE_OTHER): Payer: Medicare Other | Admitting: Physician Assistant

## 2023-02-01 ENCOUNTER — Encounter: Payer: Self-pay | Admitting: Physician Assistant

## 2023-02-01 VITALS — Resp 16 | Ht 63.0 in

## 2023-02-01 DIAGNOSIS — R0602 Shortness of breath: Secondary | ICD-10-CM

## 2023-02-01 DIAGNOSIS — U099 Post covid-19 condition, unspecified: Secondary | ICD-10-CM

## 2023-02-01 DIAGNOSIS — L600 Ingrowing nail: Secondary | ICD-10-CM | POA: Diagnosis not present

## 2023-02-01 NOTE — Progress Notes (Signed)
[facility] [address] [place], [place] [address]  Internal MEDICINE  Telephone Visit  Patient Name: Teresa Crawford  [id number]  [id number]  Date of Service: 02/10/2023  I connected with the patient at 4:12 by telephone and verified the patients identity using two identifiers.   I discussed the limitations, risks, security and privacy concerns of performing an evaluation and management service by telephone and the availability of in person appointments. I also discussed with the patient that there may be a patient responsible charge related to the service.  The patient expressed understanding and agrees to proceed.    Chief Complaint  Patient presents with   Telephone Assessment    [phone], prefers phone call   Telephone Screen   Sore Throat   Nausea   Dizziness   Chills   Vomiting   Shortness of Breath   Wheezing    HPI Pt is here for virtual sick visit -Friday night throat really aching. Doing lozenges and warm tea with honey which is helping now -Still wheezing and SOB since covid. Using inhalers and has been treated for bronchitis as well as sinusitis with several ABX since. Symptoms have continued. Pt has prednisone on allergy list therefore will avoid this. Will move forward with CT chest given ongoing post-covid syndrome -dizziness still present, worse last night. Positional vertigo dx with ENT. Following up with Dr. Jenne Campus in a few weeks. -ENT also looked into her throat and states she was told it was coming from stomach and may need to follow up with GI -needs referral to foot doctor, ingrown left big toe  Current Medication: Outpatient Encounter Medications as of 02/01/2023  Medication Sig   acetaminophen (TYLENOL) 500 MG tablet 2 TABS PO BID PRN FOR HEADACHES AND PAINS   albuterol (VENTOLIN HFA) 108 (90 Base) MCG/ACT inhaler Inhale 2 puffs into the lungs every 6 (six) hours as needed for wheezing or shortness of breath.   budesonide-formoterol  (SYMBICORT) 160-4.5 MCG/ACT inhaler Inhale 2 puffs into the lungs 2 (two) times daily.   buPROPion (WELLBUTRIN) 75 MG tablet Take 1 tablet (75 mg total) by mouth daily with breakfast.   cetirizine (ZYRTEC ALLERGY) 10 MG tablet Take 1 tablet (10 mg total) by mouth daily.   cyanocobalamin (VITAMIN B12) 1000 MCG tablet Take 1 tablet (1,000 mcg total) by mouth daily.   docusate sodium (COLACE) 100 MG capsule TAKE 1 CAPSULE BY MOUTH ONCE DAILY FOR STOOL SOFTENER   EPINEPHrine 0.3 mg/0.3 mL IJ SOAJ injection USE FOR ANAPHYLAXIS   fluticasone (FLONASE) 50 MCG/ACT nasal spray Place 1 spray into both nostrils daily.   fluticasone (FLOVENT HFA) 220 MCG/ACT inhaler INHALE 2 PUFFS INTO THE LUNGS, 2 TIMES PER DAY. RINSE MOUTH AFTER INHALATIONS   Ipratropium-Albuterol (COMBIVENT RESPIMAT) 20-100 MCG/ACT AERS respimat Inhale 1 puff into the lungs every 6 (six) hours as needed for wheezing.   omeprazole (PRILOSEC) 40 MG capsule TAKE 1 CAPSULE BY MOUTH TWICE A DAY   ondansetron (ZOFRAN-ODT) 4 MG disintegrating tablet Take 1 tablet (4 mg total) by mouth every 8 (eight) hours as needed for nausea or vomiting.   promethazine (PHENERGAN) 25 MG suppository Place 1 suppository (25 mg total) rectally every 6 (six) hours as needed for nausea or vomiting.   tamsulosin (FLOMAX) 0.4 MG CAPS capsule    topiramate (TOPAMAX) 50 MG tablet TAKE 1 TABLET BY MOUTH EVERY EVENING   traMADol (ULTRAM) 50 MG tablet ONE TAB PO BID PRN FOR PAIN AND HEADCAHES   traZODone (DESYREL)  50 MG tablet Take 0.5 tablets (25 mg total) by mouth at bedtime.   triamcinolone cream (KENALOG) 0.1 % Apply 1 Application topically 2 (two) times daily as needed (itching and rash).   Vitamin D, Ergocalciferol, (DRISDOL) 1.25 MG (50000 UNIT) CAPS capsule Take 1 capsule (50,000 Units total) by mouth every 7 (seven) days.   No facility-administered encounter medications on file as of 02/01/2023.    Surgical History: Past Surgical History:  Procedure  Laterality Date   ABDOMINAL HYSTERECTOMY     BACK SURGERY     COLONOSCOPY  04/27/2006   Wohl-colitis, internal hemorrhoids   COLONOSCOPY  02/2013   focal right colitis, left colon biopsy negative, focal active proctitis without chronicity   COLONOSCOPY WITH PROPOFOL N/A 07/16/2022   Procedure: COLONOSCOPY WITH PROPOFOL;  Surgeon: Toney Reil, MD;  Location: [facility] ENDOSCOPY;  Service: Gastroenterology;  Laterality: N/A;   ESOPHAGOGASTRODUODENOSCOPY  02/2013   multiple peptic & duodenal ulcers, negative h pylori   INTRAUTERINE DEVICE INSERTION  2012   LAPAROSCOPIC ENDOMETRIOSIS FULGURATION  2015   [name]   OVARIAN CYST REMOVAL      Medical History: Past Medical History:  Diagnosis Date   B12 deficiency anemia    Chronic back pain    Chronic fatigue syndrome    Constipation    Depression    Elevated liver enzymes    Endometriosis    Fibroids    Hepatic steatosis    Hypothyroid    Last menstrual period (LMP) > 10 days ago 09/12/15   Mental retardation    Migraine    Post traumatic stress disorder    Posttraumatic stress disorder    PUD (peptic ulcer disease)    Scoliosis    Splenomegaly    Uterine fibroid     Family History: Family History  Problem Relation Age of Onset   Breast cancer Mother 19   COPD Father    Lung cancer Father 67   Dementia Father    Diabetes Sister    Diabetes Brother    Ovarian cancer Paternal Aunt    Colon cancer Maternal Grandfather 36   Liver disease Maternal Aunt        ?etiology   Lymphoma Maternal Grandmother 32   Lung cancer Other 34       maternal great grandfather   Stomach cancer Other 33       maternal great grandmother    Social History   Socioeconomic History   Marital status: Single    Spouse name: Not on file   Number of children: 0   Years of education: Not on file   Highest education level: Not on file  Occupational History   Not on file  Tobacco Use   Smoking status: Never   Smokeless tobacco: Never   Vaping Use   Vaping status: Never Used  Substance and Sexual Activity   Alcohol use: Not Currently    Alcohol/week: 0.0 standard drinks of alcohol   Drug use: No   Sexual activity: Not Currently  Other Topics Concern   Not on file  Social History Narrative   She has brother, half brother & sister live nearby   Both parents deceased   Care managed by Anselm Pancoast   Social Determinants of Health   Financial Resource Strain: Low Risk  (02/11/2017)   Overall Financial Resource Strain (CARDIA)    Difficulty of Paying Living Expenses: Not hard at all  Food Insecurity: No Food Insecurity (02/11/2017)   Hunger Vital Sign  Worried About Programme researcher, broadcasting/film/video in the Last Year: Never true    Ran Out of Food in the Last Year: Never true  Transportation Needs: No Transportation Needs (02/11/2017)   PRAPARE - Administrator, Civil Service (Medical): No    Lack of Transportation (Non-Medical): No  Physical Activity: Inactive (02/11/2017)   Exercise Vital Sign    Days of Exercise per Week: 0 days    Minutes of Exercise per Session: 0 min  Stress: No Stress Concern Present (02/11/2017)   [employer] of Occupational Health - Occupational Stress Questionnaire    Feeling of Stress : Not at all  Social Connections: Unknown (02/11/2017)   Social Connection and Isolation Panel [NHANES]    Frequency of Communication with Friends and Family: Patient declined    Frequency of Social Gatherings with Friends and Family: Patient declined    Attends Religious Services: Patient declined    Database administrator or Organizations: Patient declined    Attends Banker Meetings: Patient declined    Marital Status: Patient declined  Intimate Partner Violence: Unknown (02/11/2017)   Humiliation, Afraid, Rape, and Kick questionnaire    Fear of Current or Ex-Partner: Patient declined    Emotionally Abused: Patient declined    Physically Abused: Patient declined    Sexually Abused:  Patient declined      Review of Systems  Constitutional:  Positive for fatigue.  HENT:  Positive for postnasal drip and sore throat. Negative for mouth sores.   Respiratory:  Positive for shortness of breath and wheezing. Negative for cough.   Cardiovascular:  Negative for chest pain.  Gastrointestinal:  Positive for nausea.  Genitourinary:  Negative for flank pain.  Skin:        Ingrown toenail  Neurological:  Positive for dizziness.  Psychiatric/Behavioral: Negative.      Vital Signs: Resp 16   Ht 5\' 3"  (1.6 m)   LMP 12/06/2015   BMI 35.61 kg/m    Observation/Objective:  Pt is able to carry out conversation   Assessment/Plan: 1. Post-COVID syndrome Will order CT chest for further evaluation - CT Chest Wo Contrast; Future  2. SOB (shortness of breath) Will order CT chest for further evaluation - CT Chest Wo Contrast; Future  3. Ingrown toenail - Ambulatory referral to Podiatry   General Counseling: Kathie Dike understanding of the findings of today's phone visit and agrees with plan of treatment. I have discussed any further diagnostic evaluation that may be needed or ordered today. We also reviewed her medications today. she has been encouraged to call the office with any questions or concerns that should arise related to todays visit.    Orders Placed This Encounter  Procedures   CT Chest Wo Contrast   Ambulatory referral to Podiatry    No orders of the defined types were placed in this encounter.   Time spent:30 Minutes    Dr Lyndon Code Internal medicine

## 2023-02-02 DIAGNOSIS — Z23 Encounter for immunization: Secondary | ICD-10-CM | POA: Diagnosis not present

## 2023-02-02 NOTE — Telephone Encounter (Signed)
error 

## 2023-02-04 ENCOUNTER — Telehealth: Payer: Self-pay

## 2023-02-04 NOTE — Telephone Encounter (Signed)
Pt called back that  she had knot on left side offered her appt today pt refused advised her go to urgent care

## 2023-02-04 NOTE — Telephone Encounter (Signed)
Lmom to pt that we returning her call

## 2023-02-08 DIAGNOSIS — R42 Dizziness and giddiness: Secondary | ICD-10-CM | POA: Diagnosis not present

## 2023-02-09 ENCOUNTER — Other Ambulatory Visit: Payer: Medicare Other

## 2023-02-09 ENCOUNTER — Telehealth: Payer: Self-pay | Admitting: Nurse Practitioner

## 2023-02-09 NOTE — Telephone Encounter (Signed)
Lvm for patient to call [facility] @ [phone] to schedule Ct-Toni

## 2023-02-10 ENCOUNTER — Other Ambulatory Visit: Payer: Self-pay | Admitting: Unknown Physician Specialty

## 2023-02-10 DIAGNOSIS — R070 Pain in throat: Secondary | ICD-10-CM | POA: Diagnosis not present

## 2023-02-10 DIAGNOSIS — R221 Localized swelling, mass and lump, neck: Secondary | ICD-10-CM

## 2023-02-10 DIAGNOSIS — M542 Cervicalgia: Secondary | ICD-10-CM

## 2023-02-15 DIAGNOSIS — R42 Dizziness and giddiness: Secondary | ICD-10-CM | POA: Diagnosis not present

## 2023-02-16 ENCOUNTER — Ambulatory Visit
Admission: RE | Admit: 2023-02-16 | Discharge: 2023-02-16 | Disposition: A | Discharge status: Home / Self Care | Payer: Medicare Other | Source: Ambulatory Visit | Attending: Physician Assistant | Admitting: Physician Assistant

## 2023-02-16 ENCOUNTER — Other Ambulatory Visit: Payer: Medicare Other

## 2023-02-16 ENCOUNTER — Telehealth: Payer: Self-pay | Admitting: Nurse Practitioner

## 2023-02-16 ENCOUNTER — Ambulatory Visit
Admission: RE | Admit: 2023-02-16 | Discharge: 2023-02-16 | Disposition: A | Discharge status: Home / Self Care | Payer: Medicare Other | Source: Ambulatory Visit | Attending: Unknown Physician Specialty | Admitting: Unknown Physician Specialty

## 2023-02-16 DIAGNOSIS — J342 Deviated nasal septum: Secondary | ICD-10-CM | POA: Diagnosis not present

## 2023-02-16 DIAGNOSIS — J329 Chronic sinusitis, unspecified: Secondary | ICD-10-CM

## 2023-02-16 DIAGNOSIS — R0602 Shortness of breath: Secondary | ICD-10-CM

## 2023-02-16 DIAGNOSIS — J3489 Other specified disorders of nose and nasal sinuses: Secondary | ICD-10-CM | POA: Diagnosis not present

## 2023-02-16 DIAGNOSIS — U099 Post covid-19 condition, unspecified: Secondary | ICD-10-CM

## 2023-02-16 NOTE — Telephone Encounter (Signed)
Patient called for status on her FL2 paperwork. Sent secure chat to [facility]

## 2023-02-17 ENCOUNTER — Inpatient Hospital Stay
Admission: RE | Admit: 2023-02-17 | Discharge: 2023-02-17 | Discharge status: Home / Self Care | Payer: Medicare Other | Source: Ambulatory Visit | Attending: Unknown Physician Specialty | Admitting: Unknown Physician Specialty

## 2023-02-17 DIAGNOSIS — R221 Localized swelling, mass and lump, neck: Secondary | ICD-10-CM

## 2023-02-18 ENCOUNTER — Telehealth: Payer: Self-pay | Admitting: Nurse Practitioner

## 2023-02-18 NOTE — Telephone Encounter (Signed)
FL2 completed. Scanned. Notified patient ready to p/u @ front desk-Toni

## 2023-02-22 ENCOUNTER — Telehealth: Payer: Self-pay | Admitting: Nurse Practitioner

## 2023-02-22 DIAGNOSIS — N390 Urinary tract infection, site not specified: Secondary | ICD-10-CM | POA: Diagnosis not present

## 2023-02-22 DIAGNOSIS — M549 Dorsalgia, unspecified: Secondary | ICD-10-CM | POA: Diagnosis not present

## 2023-02-22 NOTE — Telephone Encounter (Signed)
Patient called requesting appointment for back pain x 2 days. Per cma. Patient to go to urgent care since we have no available appointments until Thursday 02/25/23-Toni

## 2023-02-23 ENCOUNTER — Encounter: Payer: Self-pay | Admitting: Podiatry

## 2023-02-23 ENCOUNTER — Ambulatory Visit (INDEPENDENT_AMBULATORY_CARE_PROVIDER_SITE_OTHER): Payer: Medicare Other | Admitting: Podiatry

## 2023-02-23 VITALS — Ht 63.0 in | Wt 201.0 lb

## 2023-02-23 DIAGNOSIS — B351 Tinea unguium: Secondary | ICD-10-CM

## 2023-02-23 DIAGNOSIS — M79674 Pain in right toe(s): Secondary | ICD-10-CM

## 2023-02-23 DIAGNOSIS — M79675 Pain in left toe(s): Secondary | ICD-10-CM | POA: Diagnosis not present

## 2023-02-23 NOTE — Progress Notes (Signed)
  Subjective:  Patient ID: Teresa Crawford, female    DOB: 02/12/1986,  MRN: [id number]  Chief Complaint  Patient presents with   Ingrown Toenail    Pt is here due to overgrown toenail on greater toe of the left foot, states its very painful.   37 y.o. female returns for the above complaint.  Patient presents with thickened elongated dystrophic mycotic toenails x 10 mild pain on metatarsophalangeal joints with pressure patient would like for me debride down she is not able to resolve denies any other acute complaints.  Objective:  There were no vitals filed for this visit. Podiatric Exam: Vascular: dorsalis pedis and posterior tibial pulses are palpable bilateral. Capillary return is immediate. Temperature gradient is WNL. Skin turgor WNL  Sensorium: Normal Semmes Weinstein monofilament test. Normal tactile sensation bilaterally. Nail Exam: Pt has thick disfigured discolored nails with subungual debris noted bilateral entire nail hallux through fifth toenails.  Pain on palpation to the nails. Ulcer Exam: There is no evidence of ulcer or pre-ulcerative changes or infection. Orthopedic Exam: Muscle tone and strength are WNL. No limitations in general ROM. No crepitus or effusions noted.  Skin: No Porokeratosis. No infection or ulcers    Assessment & Plan:   1. Pain due to onychomycosis of toenails of both feet     Patient was evaluated and treated and all questions answered.  Onychomycosis with pain  -Nails palliatively debrided as below. -Educated on self-care  Procedure: Nail Debridement Rationale: pain  Type of Debridement: manual, sharp debridement. Instrumentation: Nail nipper, rotary burr. Number of Nails: 10  Procedures and Treatment: Consent by patient was obtained for treatment procedures. The patient understood the discussion of treatment and procedures well. All questions were answered thoroughly reviewed. Debridement of mycotic and hypertrophic toenails, 1 through 5  bilateral and clearing of subungual debris. No ulceration, no infection noted.  Return Visit-Office Procedure: Patient instructed to return to the office for a follow up visit 3 months for continued evaluation and treatment.  Nicholes Rough, DPM    No follow-ups on file.

## 2023-02-24 ENCOUNTER — Encounter: Payer: Self-pay | Admitting: Psychiatry

## 2023-02-24 ENCOUNTER — Ambulatory Visit (INDEPENDENT_AMBULATORY_CARE_PROVIDER_SITE_OTHER): Payer: Medicare Other | Admitting: Psychiatry

## 2023-02-24 VITALS — BP 122/80 | HR 85 | Temp 97.2°F | Ht 63.0 in | Wt 202.4 lb

## 2023-02-24 DIAGNOSIS — F79 Unspecified intellectual disabilities: Secondary | ICD-10-CM

## 2023-02-24 DIAGNOSIS — F4310 Post-traumatic stress disorder, unspecified: Secondary | ICD-10-CM

## 2023-02-24 DIAGNOSIS — F330 Major depressive disorder, recurrent, mild: Secondary | ICD-10-CM | POA: Diagnosis not present

## 2023-02-24 DIAGNOSIS — F5105 Insomnia due to other mental disorder: Secondary | ICD-10-CM

## 2023-02-24 MED ORDER — FLUOXETINE HCL 10 MG PO CAPS: 10.0000 mg | ORAL_CAPSULE | Freq: Every day | ORAL | 1 refills | Status: DC

## 2023-02-24 MED ORDER — TRAZODONE HCL 50 MG PO TABS: 25.0000 mg | ORAL_TABLET | Freq: Every day | ORAL | 1 refills | Status: DC

## 2023-02-24 NOTE — Progress Notes (Unsigned)
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  Patient today when she had COVID-19 infection several months ago and continues to struggle with possible non-COVID symptoms.  Patient reports she struggles with fatigue, low energy, excessive sleepiness during the day, reduced appetite on a regular basis.  Patient reports she has been compliant on the Wellbutrin however continues to struggle with these symptoms.  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  Patient reports appetite is low however she manages to eat enough for herself.   She reports her cousin passed away recently and that was also a struggle.  Although she is grieving she is coping okay.  Patient reports she stopped working due to her current symptoms.   Patient continues to live in an independent living home and does have support system.  Patient today presented with her caregiver.  Patient denies any suicidality, homicidality or perceptual disturbances.  Patient reports she currently does not have a therapist however is willing to establish care with a new therapist to start CBT.  Patient denies any other concerns today.    Visit Diagnosis:    ICD-10-CM   1. MDD (major depressive disorder), recurrent episode, mild (HCC)  F33.0 FLUoxetine (PROZAC) 10 MG capsule    2. Posttraumatic stress disorder  F43.10 FLUoxetine (PROZAC) 10 MG capsule    3. Insomnia due to mental condition  F51.05 traZODone (DESYREL) 50 MG tablet   depression      Past Psychiatric History: I have  reviewed past psychiatric history from progress note on 08/16/2018.  Past Medical History:  Past Medical History:  Diagnosis Date   B12 deficiency anemia    Chronic back pain    Chronic fatigue syndrome    Constipation    Depression    Elevated liver enzymes    Endometriosis    Fibroids    Hepatic steatosis    Hypothyroid    Last menstrual period (LMP) > 10 days ago 09/12/15   Mental retardation    Migraine    Post traumatic stress disorder    Posttraumatic stress disorder    PUD (peptic ulcer disease)    Scoliosis    Splenomegaly    Uterine fibroid     Past Surgical History:  Procedure Laterality Date   ABDOMINAL HYSTERECTOMY     BACK SURGERY     COLONOSCOPY  04/27/2006   Wohl-colitis, internal hemorrhoids   COLONOSCOPY  02/2013   focal right colitis, left colon biopsy negative, focal active proctitis without chronicity   COLONOSCOPY WITH PROPOFOL N/A 07/16/2022   Procedure: COLONOSCOPY WITH PROPOFOL;  Surgeon: Toney Reil, MD;  Location: [facility] ENDOSCOPY;  Service: Gastroenterology;  Laterality: N/A;   ESOPHAGOGASTRODUODENOSCOPY  02/2013   multiple peptic & duodenal ulcers, negative h pylori   INTRAUTERINE DEVICE INSERTION  2012   LAPAROSCOPIC ENDOMETRIOSIS FULGURATION  2015   [name]   OVARIAN CYST REMOVAL      Family Psychiatric History: I have reviewed family psychiatric history from progress note on 08/16/2018.  Family History:  Family History  Problem Relation Age  of Onset   Breast cancer Mother 69   COPD Father    Lung cancer Father 82   Dementia Father    Diabetes Sister    Diabetes Brother    Ovarian cancer Paternal Aunt    Colon cancer Maternal Grandfather 68   Liver disease Maternal Aunt        ?etiology   Lymphoma Maternal Grandmother 22   Lung cancer Other 13       maternal great grandfather   Stomach cancer Other 48       maternal great grandmother    Social History: I have reviewed social history from progress note on 08/16/2018. Social  History   Socioeconomic History   Marital status: Single    Spouse name: Not on file   Number of children: 0   Years of education: Not on file   Highest education level: Not on file  Occupational History   Not on file  Tobacco Use   Smoking status: Never   Smokeless tobacco: Never  Vaping Use   Vaping status: Never Used  Substance and Sexual Activity   Alcohol use: Not Currently    Alcohol/week: 0.0 standard drinks of alcohol   Drug use: No   Sexual activity: Not Currently  Other Topics Concern   Not on file  Social History Narrative   She has brother, half brother & sister live nearby   Both parents deceased   Care managed by Anselm Pancoast   Social Determinants of Health   Financial Resource Strain: Low Risk  (02/11/2017)   Overall Financial Resource Strain (CARDIA)    Difficulty of Paying Living Expenses: Not hard at all  Food Insecurity: No Food Insecurity (02/11/2017)   Hunger Vital Sign    Worried About Running Out of Food in the Last Year: Never true    Ran Out of Food in the Last Year: Never true  Transportation Needs: No Transportation Needs (02/11/2017)   PRAPARE - Administrator, Civil Service (Medical): No    Lack of Transportation (Non-Medical): No  Physical Activity: Inactive (02/11/2017)   Exercise Vital Sign    Days of Exercise per Week: 0 days    Minutes of Exercise per Session: 0 min  Stress: No Stress Concern Present (02/11/2017)   [employer] of Occupational Health - Occupational Stress Questionnaire    Feeling of Stress : Not at all  Social Connections: Unknown (02/11/2017)   Social Connection and Isolation Panel [NHANES]    Frequency of Communication with Friends and Family: Patient declined    Frequency of Social Gatherings with Friends and Family: Patient declined    Attends Religious Services: Patient declined    Database administrator or Organizations: Patient declined    Attends Banker Meetings: Patient declined     Marital Status: Patient declined    Allergies:  Allergies  Allergen Reactions   Other Anaphylaxis    Peanut butter, uses Epi pen   Peanut Butter Flavor Swelling    Throat swells Throat swells   Sertraline Other (See Comments)    Panic attacks   Codeine Nausea And Vomiting    nausea   Paroxetine Other (See Comments)    Panic Attack    Paroxetine Hcl Other (See Comments)    Panic attack Other reaction(s): Other (See Comments) Panic attacks   Prednisone & Diphenhydramine Rash   Venlafaxine Other (See Comments)    Panic Attack    Zoloft [Sertraline Hcl] Other (See Comments)  Panic attack    Metabolic Disorder Labs: Lab Results  Component Value Date   HGBA1C 5.5 12/20/2019   No results found for: "PROLACTIN" Lab Results  Component Value Date   CHOL 155 01/15/2022   TRIG 97 01/15/2022   HDL 34 (L) 01/15/2022   CHOLHDL 4.6 (H) 01/15/2022   LDLCALC 103 (H) 01/15/2022   LDLCALC 99 12/27/2020   Lab Results  Component Value Date   TSH 3.060 12/28/2022   TSH 1.990 01/15/2022    Therapeutic Level Labs: No results found for: "LITHIUM" No results found for: "VALPROATE" No results found for: "CBMZ"  Current Medications: Current Outpatient Medications  Medication Sig Dispense Refill   acetaminophen (TYLENOL) 500 MG tablet 2 TABS PO BID PRN FOR HEADACHES AND PAINS 45 tablet 5   albuterol (VENTOLIN HFA) 108 (90 Base) MCG/ACT inhaler Inhale 2 puffs into the lungs every 6 (six) hours as needed for wheezing or shortness of breath. 8 g 5   budesonide-formoterol (SYMBICORT) 160-4.5 MCG/ACT inhaler Inhale 2 puffs into the lungs 2 (two) times daily. 10.2 g 12   buPROPion (WELLBUTRIN) 75 MG tablet Take 1 tablet (75 mg total) by mouth daily with breakfast. 90 tablet 1   cetirizine (ZYRTEC ALLERGY) 10 MG tablet Take 1 tablet (10 mg total) by mouth daily. 90 tablet 1   cyanocobalamin (VITAMIN B12) 1000 MCG tablet Take 1 tablet (1,000 mcg total) by mouth daily. 90 tablet 2    docusate sodium (COLACE) 100 MG capsule TAKE 1 CAPSULE BY MOUTH ONCE DAILY FOR STOOL SOFTENER 90 capsule 1   EPINEPHrine 0.3 mg/0.3 mL IJ SOAJ injection USE FOR ANAPHYLAXIS 1 each 5   FLUoxetine (PROZAC) 10 MG capsule Take 1 capsule (10 mg total) by mouth daily with breakfast. 30 capsule 1   fluticasone (FLONASE) 50 MCG/ACT nasal spray Place 1 spray into both nostrils daily. 16 g 5   fluticasone (FLOVENT HFA) 220 MCG/ACT inhaler INHALE 2 PUFFS INTO THE LUNGS, 2 TIMES PER DAY. RINSE MOUTH AFTER INHALATIONS 12 g 11   ibuprofen (ADVIL) 800 MG tablet Take 800 mg by mouth 3 (three) times daily.     Ipratropium-Albuterol (COMBIVENT RESPIMAT) 20-100 MCG/ACT AERS respimat Inhale 1 puff into the lungs every 6 (six) hours as needed for wheezing. 4 g 6   omeprazole (PRILOSEC) 40 MG capsule TAKE 1 CAPSULE BY MOUTH TWICE A DAY 60 capsule 5   ondansetron (ZOFRAN-ODT) 4 MG disintegrating tablet Take 1 tablet (4 mg total) by mouth every 8 (eight) hours as needed for nausea or vomiting. 20 tablet 0   promethazine (PHENERGAN) 25 MG suppository Place 1 suppository (25 mg total) rectally every 6 (six) hours as needed for nausea or vomiting. 12 each 0   tamsulosin (FLOMAX) 0.4 MG CAPS capsule      topiramate (TOPAMAX) 50 MG tablet TAKE 1 TABLET BY MOUTH EVERY EVENING 30 tablet 5   traMADol (ULTRAM) 50 MG tablet ONE TAB PO BID PRN FOR PAIN AND HEADCAHES 60 tablet 2   triamcinolone cream (KENALOG) 0.1 % Apply 1 Application topically 2 (two) times daily as needed (itching and rash). 453.6 g 1   Vitamin D, Ergocalciferol, (DRISDOL) 1.25 MG (50000 UNIT) CAPS capsule Take 1 capsule (50,000 Units total) by mouth every 7 (seven) days. 12 capsule 1   traZODone (DESYREL) 50 MG tablet Take 0.5 tablets (25 mg total) by mouth at bedtime. 45 tablet 1   No current facility-administered medications for this visit.     Musculoskeletal: Strength & Muscle Tone:  within normal limits Gait & Station: normal Patient leans:  N/A  Psychiatric Specialty Exam: Review of Systems  Constitutional:  Positive for fatigue.  Psychiatric/Behavioral:  Positive for sleep disturbance.        Grieving    Blood pressure 122/80, pulse 85, temperature (!) 97.2 F (36.2 C), temperature source Skin, height 5\' 3"  (1.6 m), weight 202 lb 6.4 oz (91.8 kg), last menstrual period 12/06/2015.Body mass index is 35.85 kg/m.  General Appearance: Casual  Eye Contact:  Fair  Speech:  Clear and Coherent  Volume:  Normal  Mood:   Grieving  Affect:  Congruent  Thought Process:  Goal Directed and Descriptions of Associations: Intact  Orientation:  Full (Time, Place, and Person)  Thought Content: Logical   Suicidal Thoughts:  No  Homicidal Thoughts:  No  Memory:  Immediate;   Fair Recent;   Fair Remote;   Fair  Judgement:  Fair  Insight:  Fair  Psychomotor Activity:  Normal  Concentration:  Concentration: Fair and Attention Span: Fair  Recall:  [employer] of Knowledge: Fair  Language: Fair  Akathisia:  No  Handed:  Right  AIMS (if indicated): not done  Assets:  Communication Skills Desire for Improvement Housing Social Support  ADL's:  Intact  Cognition: WNL  Sleep:   Excessive   Screenings: AIMS    Flowsheet Row Video Visit from 01/20/2022 in [facility] Video Visit from 10/22/2021 in [facility]  AIMS Total Score 0 0      GAD-7    Flowsheet Row Office Visit from 02/24/2023 in [facility] Office Visit from 04/21/2022 in [facility] Video Visit from 01/20/2022 in [facility] Video Visit from 10/22/2021 in [facility] Office Visit from 04/08/2021 in [facility]  Total GAD-7 Score 0 0 1 0 0      Mini-Mental    Flowsheet Row Office Visit  from 01/07/2023 in [facility], [facility] Office Visit from 01/01/2022 in [facility], [facility] Clinical Support from 12/25/2020 in [facility], [facility] Clinical Support from 12/19/2019 in [facility], [facility] Office Visit from 10/13/2017 in [facility], [facility]  Total Score (max 30 points ) 29 29 29 30 27       PHQ2-9    Flowsheet Row Office Visit from 02/24/2023 in [facility] Office Visit from 01/07/2023 in [facility], [facility] Office Visit from 04/21/2022 in [facility] Video Visit from 01/20/2022 in [facility] Video Visit from 10/22/2021 in [facility]  PHQ-2 Total Score 0 3 0 0 0  PHQ-9 Total Score 5 7 0 -- --      Flowsheet Row Office Visit from 02/24/2023 in [facility] ED from 11/10/2022 in [facility] Emergency Department at [facility] Video Visit from 10/20/2022 in [facility]  C-SSRS RISK CATEGORY No Risk No Risk No Risk        Assessment and Plan: ***  Collaboration of Care: Collaboration of Care: [facility] OP Collaboration of [id number]}  Patient/Guardian was advised Release of Information must be obtained prior to any record release in order to collaborate their care with an outside provider. Patient/Guardian was advised if they have not already done so to contact the registration  department to sign all necessary forms in order for [place] to release information regarding their care.   Consent: Patient/Guardian gives verbal consent for treatment and assignment of benefits for services provided during this visit. Patient/Guardian expressed understanding and agreed to proceed.    Jomarie Longs, MD 02/24/2023, 11:35 AM

## 2023-02-24 NOTE — Patient Instructions (Signed)
Fluoxetine Capsules or Tablets (Depression/Mood Disorders) What is this medication? FLUOXETINE (floo OX e teen) treats depression, anxiety, obsessive-compulsive disorder (OCD), and eating disorders. It increases the amount of serotonin in the brain, a hormone that helps regulate mood. It belongs to a group of medications called SSRIs. This medicine may be used for other purposes; ask your health care provider or pharmacist if you have questions. COMMON BRAND NAME(S): Prozac What should I tell my care team before I take this medication? They need to know if you have any of these conditions: Bipolar disorder or a family history of bipolar disorder Bleeding disorder Glaucoma Heart disease Liver disease Low levels of sodium in the blood Seizures Suicidal thoughts, plans, or attempt by you or a family member Take an MAOI, such as Carbex, Eldepryl, Marplan, Nardil, or Parnate Take medications that treat or prevent blood clots Thyroid disease An unusual or allergic reaction to fluoxetine, other medications, foods, dyes, or preservatives Pregnant or trying to get pregnant Breastfeeding How should I use this medication? Take this medication by mouth with a glass of water. Follow the directions on the prescription label. You can take this medication with or without food. Take your medication at regular intervals. Do not take it more often than directed. Do not stop taking this medication suddenly except upon the advice of your care team. Stopping this medication too quickly may cause serious side effects or your condition may worsen. A special MedGuide will be given to you by the pharmacist with each prescription and refill. Be sure to read this information carefully each time. Talk to your care team about the use of this medication in children. While it may be prescribed for children as young as 7 years for selected conditions, precautions do apply. Overdosage: If you think you have taken too much of  this medicine contact a poison control center or emergency room at once. NOTE: This medicine is only for you. Do not share this medicine with others. What if I miss a dose? If you miss a dose, skip the missed dose and go back to your regular dosing schedule. Do not take double or extra doses. What may interact with this medication? Do not take this medication with any of the following: Other medications containing fluoxetine, such as Sarafem or Symbyax Cisapride Dronedarone Linezolid MAOIs, such as Carbex, Eldepryl, Marplan, Nardil, and Parnate Methylene blue (injected into a vein) Pimozide Thioridazine This medication may also interact with the following: Alcohol Amphetamines Aspirin and aspirin-like medications Carbamazepine Certain medications for mental health conditions Certain medications for migraine headache, such as almotriptan, eletriptan, frovatriptan, naratriptan, rizatriptan, sumatriptan, zolmitriptan Digoxin Diuretics Fentanyl Flecainide Furazolidone Isoniazid Lithium Medications that help you fall asleep Medications that treat or prevent blood clots, such as warfarin, enoxaparin, and dalteparin NSAIDs, medications for pain and inflammation, such as ibuprofen or naproxen Other medications that cause heart rhythm changes Phenytoin Procarbazine Propafenone Rasagiline Ritonavir Supplements, such as St. John's wort, kava kava, valerian Tramadol Tryptophan Vinblastine This list may not describe all possible interactions. Give your health care provider a list of all the medicines, herbs, non-prescription drugs, or dietary supplements you use. Also tell them if you smoke, drink alcohol, or use illegal drugs. Some items may interact with your medicine. What should I watch for while using this medication? Tell your care team if your symptoms do not get better or if they get worse. Visit your care team for regular checks on your progress. Because it may take several  weeks to see  the full effects of this medication, it is important to continue your treatment as prescribed. Watch for new or worsening thoughts of suicide or depression. This includes sudden changes in mood, behavior, or thoughts. These changes can happen at any time but are more common in the beginning of treatment or after a change in dose. Call your care team right away if you experience these thoughts or worsening depression. This medication may cause mood and behavior changes, such as anxiety, nervousness, irritability, hostility, restlessness, excitability, hyperactivity, or trouble sleeping. These changes can happen at any time but are more common in the beginning of treatment or after a change in dose. Call your care team right away if you notice any of these symptoms. This medication may affect your coordination, reaction time, or judgment. Do not drive or operate machinery until you know how this medication affects you. Sit up or stand slowly to reduce the risk of dizzy or fainting spells. Drinking alcohol with this medication can increase the risk of these side effects. Your mouth may get dry. Chewing sugarless gum or sucking hard candy and drinking plenty of water may help. Contact your care team if the problem does not go away or is severe. This medication may affect blood sugar levels. If you have diabetes, check with your care team before you make changes to your diet or medications. What side effects may I notice from receiving this medication? Side effects that you should report to your care team as soon as possible: Allergic reactions--skin rash, itching, hives, swelling of the face, lips, tongue, or throat Bleeding--bloody or black, tar-like stools, red or dark brown urine, vomiting blood or brown material that looks like coffee grounds, small, red or purple spots on skin, unusual bleeding or bruising Heart rhythm changes--fast or irregular heartbeat, dizziness, feeling faint or  lightheaded, chest pain, trouble breathing Loss of appetite with weight loss Low sodium level--muscle weakness, fatigue, dizziness, headache, confusion Serotonin syndrome--irritability, confusion, fast or irregular heartbeat, muscle stiffness, twitching muscles, sweating, high fever, seizure, chills, vomiting, diarrhea Sudden eye pain or change in vision such as blurry vision, seeing halos around lights, vision loss Thoughts of suicide or self-harm, worsening mood, feelings of depression Side effects that usually do not require medical attention (report to your care team if they continue or are bothersome): Anxiety, nervousness Change in sex drive or performance Diarrhea Dry mouth Headache Excessive sweating Nausea Tremors or shaking Trouble sleeping Upset stomach This list may not describe all possible side effects. Call your doctor for medical advice about side effects. You may report side effects to FDA at 1-800-FDA-1088. Where should I keep my medication? Keep out of the reach of children and pets. Store at room temperature between 15 and 30 degrees C (59 and 86 degrees F). Get rid of any unused medication after the expiration date. NOTE: This sheet is a summary. It may not cover all possible information. If you have questions about this medicine, talk to your doctor, pharmacist, or health care provider.  2024 Elsevier/Gold Standard (2022-01-06 00:00:00)

## 2023-03-02 ENCOUNTER — Ambulatory Visit (INDEPENDENT_AMBULATORY_CARE_PROVIDER_SITE_OTHER): Payer: Medicare Other | Admitting: Licensed Clinical Social Worker

## 2023-03-02 DIAGNOSIS — F4310 Post-traumatic stress disorder, unspecified: Secondary | ICD-10-CM

## 2023-03-02 DIAGNOSIS — F5105 Insomnia due to other mental disorder: Secondary | ICD-10-CM

## 2023-03-02 DIAGNOSIS — F331 Major depressive disorder, recurrent, moderate: Secondary | ICD-10-CM
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
## 2023-03-09 ENCOUNTER — Ambulatory Visit (INDEPENDENT_AMBULATORY_CARE_PROVIDER_SITE_OTHER): Payer: Medicare Other | Admitting: Internal Medicine

## 2023-03-09 ENCOUNTER — Encounter: Payer: Self-pay | Admitting: Internal Medicine

## 2023-03-09 VITALS — BP 122/90 | HR 85 | Temp 98.1°F | Resp 16 | Ht 63.0 in | Wt 204.0 lb

## 2023-03-09 DIAGNOSIS — R0609 Other forms of dyspnea: Secondary | ICD-10-CM

## 2023-03-09 DIAGNOSIS — Z6835 Body mass index (BMI) 35.0-35.9, adult: Secondary | ICD-10-CM | POA: Diagnosis not present

## 2023-03-09 DIAGNOSIS — M545 Low back pain, unspecified: Secondary | ICD-10-CM

## 2023-03-09 DIAGNOSIS — Z9189 Other specified personal risk factors, not elsewhere classified: Secondary | ICD-10-CM | POA: Diagnosis not present

## 2023-03-09 NOTE — Progress Notes (Unsigned)
[facility] [address] [place], [place] [address]  Internal MEDICINE  Office Visit Note  Patient Name: Teresa Crawford  [id number]  [id number]  Date of Service: 03/10/2023  Chief Complaint  Patient presents with   Depression   Follow-up    Review Ct results    HPI Pt is seen to review CT chest results  Pt has no acute findings  Multiple other complaints including lower back pain which is severe and will like to see ortho for it She is also on multiple medications  In the room with her caregiver     Current Medication: Outpatient Encounter Medications as of 03/09/2023  Medication Sig   acetaminophen (TYLENOL) 500 MG tablet 2 TABS PO BID PRN FOR HEADACHES AND PAINS   albuterol (VENTOLIN HFA) 108 (90 Base) MCG/ACT inhaler Inhale 2 puffs into the lungs every 6 (six) hours as needed for wheezing or shortness of breath.   budesonide-formoterol (SYMBICORT) 160-4.5 MCG/ACT inhaler Inhale 2 puffs into the lungs 2 (two) times daily.   buPROPion (WELLBUTRIN) 75 MG tablet Take 1 tablet (75 mg total) by mouth daily with breakfast.   cetirizine (ZYRTEC ALLERGY) 10 MG tablet Take 1 tablet (10 mg total) by mouth daily.   cyanocobalamin (VITAMIN B12) 1000 MCG tablet Take 1 tablet (1,000 mcg total) by mouth daily.   docusate sodium (COLACE) 100 MG capsule TAKE 1 CAPSULE BY MOUTH ONCE DAILY FOR STOOL SOFTENER   EPINEPHrine 0.3 mg/0.3 mL IJ SOAJ injection USE FOR ANAPHYLAXIS   FLUoxetine (PROZAC) 10 MG capsule Take 1 capsule (10 mg total) by mouth daily with breakfast.   fluticasone (FLONASE) 50 MCG/ACT nasal spray Place 1 spray into both nostrils daily.   fluticasone (FLOVENT HFA) 220 MCG/ACT inhaler INHALE 2 PUFFS INTO THE LUNGS, 2 TIMES PER DAY. RINSE MOUTH AFTER INHALATIONS   ibuprofen (ADVIL) 800 MG tablet Take 800 mg by mouth 3 (three) times daily.   Ipratropium-Albuterol (COMBIVENT RESPIMAT) 20-100 MCG/ACT AERS respimat Inhale 1 puff into the lungs every 6 (six) hours as  needed for wheezing.   omeprazole (PRILOSEC) 40 MG capsule TAKE 1 CAPSULE BY MOUTH TWICE A DAY   ondansetron (ZOFRAN-ODT) 4 MG disintegrating tablet Take 1 tablet (4 mg total) by mouth every 8 (eight) hours as needed for nausea or vomiting.   promethazine (PHENERGAN) 25 MG suppository Place 1 suppository (25 mg total) rectally every 6 (six) hours as needed for nausea or vomiting.   tamsulosin (FLOMAX) 0.4 MG CAPS capsule    topiramate (TOPAMAX) 50 MG tablet TAKE 1 TABLET BY MOUTH EVERY EVENING   traMADol (ULTRAM) 50 MG tablet ONE TAB PO BID PRN FOR PAIN AND HEADCAHES   traZODone (DESYREL) 50 MG tablet Take 0.5 tablets (25 mg total) by mouth at bedtime.   triamcinolone cream (KENALOG) 0.1 % Apply 1 Application topically 2 (two) times daily as needed (itching and rash).   Vitamin D, Ergocalciferol, (DRISDOL) 1.25 MG (50000 UNIT) CAPS capsule Take 1 capsule (50,000 Units total) by mouth every 7 (seven) days.   No facility-administered encounter medications on file as of 03/09/2023.    Surgical History: Past Surgical History:  Procedure Laterality Date   ABDOMINAL HYSTERECTOMY     BACK SURGERY     COLONOSCOPY  04/27/2006   Wohl-colitis, internal hemorrhoids   COLONOSCOPY  02/2013   focal right colitis, left colon biopsy negative, focal active proctitis without chronicity   COLONOSCOPY WITH PROPOFOL N/A 07/16/2022   Procedure: COLONOSCOPY WITH PROPOFOL;  Surgeon: Lannette Donath  Betti Cruz, MD;  Location: [facility] ENDOSCOPY;  Service: Gastroenterology;  Laterality: N/A;   ESOPHAGOGASTRODUODENOSCOPY  02/2013   multiple peptic & duodenal ulcers, negative h pylori   INTRAUTERINE DEVICE INSERTION  2012   LAPAROSCOPIC ENDOMETRIOSIS FULGURATION  2015   [name]   OVARIAN CYST REMOVAL      Medical History: Past Medical History:  Diagnosis Date   B12 deficiency anemia    Chronic back pain    Chronic fatigue syndrome    Constipation    Depression    Elevated liver enzymes    Endometriosis    Fibroids     Hepatic steatosis    Hypothyroid    Last menstrual period (LMP) > 10 days ago 09/12/15   Mental retardation    Migraine    Post traumatic stress disorder    Posttraumatic stress disorder    PUD (peptic ulcer disease)    Scoliosis    Splenomegaly    Uterine fibroid     Family History: Family History  Problem Relation Age of Onset   Breast cancer Mother 60   COPD Father    Lung cancer Father 44   Dementia Father    Diabetes Sister    Diabetes Brother    Ovarian cancer Paternal Aunt    Colon cancer Maternal Grandfather 32   Liver disease Maternal Aunt        ?etiology   Lymphoma Maternal Grandmother 58   Lung cancer Other 18       maternal great grandfather   Stomach cancer Other 19       maternal great grandmother    Social History   Socioeconomic History   Marital status: Single    Spouse name: Not on file   Number of children: 0   Years of education: Not on file   Highest education level: Not on file  Occupational History   Not on file  Tobacco Use   Smoking status: Never   Smokeless tobacco: Never  Vaping Use   Vaping status: Never Used  Substance and Sexual Activity   Alcohol use: Not Currently    Alcohol/week: 0.0 standard drinks of alcohol   Drug use: No   Sexual activity: Not Currently  Other Topics Concern   Not on file  Social History Narrative   She has brother, half brother & sister live nearby   Both parents deceased   Care managed by Anselm Pancoast   Social Determinants of Health   Financial Resource Strain: Low Risk  (02/11/2017)   Overall Financial Resource Strain (CARDIA)    Difficulty of Paying Living Expenses: Not hard at all  Food Insecurity: No Food Insecurity (02/11/2017)   Hunger Vital Sign    Worried About Running Out of Food in the Last Year: Never true    Ran Out of Food in the Last Year: Never true  Transportation Needs: No Transportation Needs (02/11/2017)   PRAPARE - Administrator, Civil Service (Medical): No     Lack of Transportation (Non-Medical): No  Physical Activity: Inactive (02/11/2017)   Exercise Vital Sign    Days of Exercise per Week: 0 days    Minutes of Exercise per Session: 0 min  Stress: No Stress Concern Present (02/11/2017)   [employer] of Occupational Health - Occupational Stress Questionnaire    Feeling of Stress : Not at all  Social Connections: Unknown (02/11/2017)   Social Connection and Isolation Panel [NHANES]    Frequency of Communication with Friends and Family: Patient  declined    Frequency of Social Gatherings with Friends and Family: Patient declined    Attends Religious Services: Patient declined    Active Member of Clubs or Organizations: Patient declined    Attends Banker Meetings: Patient declined    Marital Status: Patient declined  Intimate Partner Violence: Unknown (02/11/2017)   Humiliation, Afraid, Rape, and Kick questionnaire    Fear of Current or Ex-Partner: Patient declined    Emotionally Abused: Patient declined    Physically Abused: Patient declined    Sexually Abused: Patient declined      Review of Systems  Constitutional:  Negative for fatigue and fever.  HENT:  Negative for congestion, mouth sores and postnasal drip.   Respiratory:  Positive for shortness of breath. Negative for cough.   Cardiovascular:  Negative for chest pain.  Genitourinary:  Negative for flank pain.  Musculoskeletal:  Positive for back pain.  Psychiatric/Behavioral: Negative.      Vital Signs: BP (!) 122/90   Pulse 85   Temp 98.1 F (36.7 C)   Resp 16   Ht 5\' 3"  (1.6 m)   Wt 204 lb (92.5 kg)   LMP 12/06/2015   SpO2 98%   BMI 36.14 kg/m    Physical Exam Constitutional:      Appearance: Normal appearance.  HENT:     Head: Normocephalic and atraumatic.     Nose: Nose normal.     Mouth/Throat:     Mouth: Mucous membranes are moist.     Pharynx: No posterior oropharyngeal erythema.  Eyes:     Extraocular Movements: Extraocular movements  intact.     Pupils: Pupils are equal, round, and reactive to light.  Cardiovascular:     Pulses: Normal pulses.     Heart sounds: Normal heart sounds.  Pulmonary:     Effort: Pulmonary effort is normal.     Breath sounds: Normal breath sounds.  Musculoskeletal:     Comments: Tenderness paraspinal muscle ( L4-L5)   Neurological:     General: No focal deficit present.     Mental Status: She is alert.  Psychiatric:        Mood and Affect: Mood normal.        Behavior: Behavior normal.        Assessment/Plan: 1. Acute midline low back pain without sciatica Needs to be seen by ortho for further dx and treatment  - Ambulatory referral to Orthopedic Surgery  2. Dyspnea on exertion Deconditioning, CT chest is normal, will re-reevaluate   3. BMI 35.0-35.9,adult Encouraged move more and sit less   4. At risk for polypharmacy Will bring all medications, will like to reduce number drugs she is on if possible    General Counseling: Kathie Dike understanding of the findings of todays visit and agrees with plan of treatment. I have discussed any further diagnostic evaluation that may be needed or ordered today. We also reviewed her medications today. she has been encouraged to call the office with any questions or concerns that should arise related to todays visit.    Orders Placed This Encounter  Procedures   Ambulatory referral to Orthopedic Surgery      Total time spent:30 Minutes Time spent includes review of chart, medications, test results, and follow up plan with the patient.    Controlled Substance Database was reviewed by me.   Dr Lyndon Code Internal medicine

## 2023-03-16 ENCOUNTER — Telehealth: Payer: Self-pay | Admitting: Internal Medicine

## 2023-03-16 NOTE — Telephone Encounter (Signed)
 Orthopedic referral sent via [facility] to [facility]. Notified patient. Gave telephone # [phone]

## 2023-03-17 ENCOUNTER — Telehealth: Payer: Self-pay | Admitting: Internal Medicine

## 2023-03-17 DIAGNOSIS — M62830 Muscle spasm of back: Secondary | ICD-10-CM | POA: Diagnosis not present

## 2023-03-17 DIAGNOSIS — M545 Low back pain, unspecified: Secondary | ICD-10-CM | POA: Diagnosis not present

## 2023-03-17 NOTE — Telephone Encounter (Signed)
Orthopedic Surgery appointment 03/17/2023 @ [facility]

## 2023-03-18 ENCOUNTER — Other Ambulatory Visit: Payer: Self-pay

## 2023-03-24 ENCOUNTER — Encounter: Payer: Self-pay | Admitting: Gastroenterology

## 2023-03-24 ENCOUNTER — Ambulatory Visit (INDEPENDENT_AMBULATORY_CARE_PROVIDER_SITE_OTHER): Payer: Medicare Other | Admitting: Gastroenterology

## 2023-03-24 ENCOUNTER — Other Ambulatory Visit: Payer: Self-pay

## 2023-03-24 VITALS — BP 126/87 | HR 80 | Temp 97.6°F | Ht 63.0 in | Wt 207.2 lb

## 2023-03-24 DIAGNOSIS — R1319 Other dysphagia: Secondary | ICD-10-CM

## 2023-03-24 DIAGNOSIS — R1310 Dysphagia, unspecified: Secondary | ICD-10-CM

## 2023-03-24 NOTE — Progress Notes (Signed)
[name], MD [address]  [address]  [place], [place] [address]  Main: [phone]  Fax: [phone]    Gastroenterology Consultation  Referring Provider:     Sallyanne Kuster, NP Primary Care Physician:  Sallyanne Kuster, NP Primary Gastroenterologist:  Dr. [name] Reason for Consultation: Difficulty swallowing        HPI:   Teresa Crawford is a 37 y.o. female referred by Sallyanne Kuster, NP  for consultation & management of left lower quadrant pain and diarrhea.  Patient went to ER on 05/15/2022 with abdominal pain, predominantly in the left lower quadrant area associated with loose bowel movements mixed with blood, nausea and vomiting.  She was originally seen by her PCP on 2/7, sent her to the ER.  Patient denies any fever, chills, nausea or vomiting.  Her labs including CBC, CMP and serum lipase were normal.  Patient is accompanied by her community coach who informed that they tried bowel regimen which helped her move her bowels after the ER visit.  She continues to drink carbonated beverages.  She does report abdominal bloating.  She has ongoing symptoms.  Patient was evaluated in 2015 for rectal bleeding and proctitis/colitis.  Fecal calprotectin levels, CRP and ESR were normal.  She was treated with cortisone enemas by [facility] gastroenterologist at that time  Follow-up visit 03/24/2023 Teresa Crawford is here to discuss about 2 months history of difficulty swallowing with both solids and liquids.  She reports that she has been eating only mashed potatoes which is leading to weight gain.  She is not able to swallow bread or any meat products.  She is taking omeprazole 40 mg twice daily, evaluated by ENT and was referred to GI for further evaluation.  She denies any heartburn, regurgitation.  She does report belching.  Patient does not smoke or drink alcohol  NSAIDs: None  Antiplts/Anticoagulants/Anti thrombotics: None  GI Procedures:  Colonoscopy  07/16/2022 - The examined portion of the ileum was normal. - Erythematous mucosa in the rectum, in the recto- sigmoid colon and in the sigmoid colon. Biopsied. - The distal rectum and anal verge are normal on retroflexion view. - The examination was otherwise normal. DIAGNOSIS:  A. COLON, SIGMOID; COLD BIOPSY:  - BENIGN COLONIC MUCOSA WITH NONSPECIFIC SUPERFICIAL SUBMUCOSAL  HYPEREMIA; OTHERWISE NO SIGNIFICANT HISTOPATHOLOGIC CHANGE.  - NEGATIVE FOR ACTIVE MUCOSAL COLITIS, MICROSCOPIC COLITIS, AND  ARCHITECTURAL CHANGES OF CHRONIC COLITIS.  - NEGATIVE FOR DYSPLASIA AND MALIGNANCY.   B. RECTUM; COLD BIOPSY:  - BENIGN RECTAL MUCOSA WITH NONSPECIFIC SUPERFICIAL SUBMUCOSAL  HYPEREMIA; OTHERWISE NO SIGNIFICANT HISTOPATHOLOGIC CHANGE.  - NEGATIVE FOR ACTIVE MUCOSAL PROCTITIS, MICROSCOPIC PROCTITIS, AND  ARCHITECTURAL CHANGES OF CHRONIC PROCTITIS.  - NEGATIVE FOR DYSPLASIA AND MALIGNANCY.    Past Medical History:  Diagnosis Date   B12 deficiency anemia    Chronic back pain    Chronic fatigue syndrome    Constipation    Depression    Elevated liver enzymes    Endometriosis    Fibroids    Hepatic steatosis    Hypothyroid    Last menstrual period (LMP) > 10 days ago 09/12/15   Mental retardation    Migraine    Post traumatic stress disorder    Posttraumatic stress disorder    PUD (peptic ulcer disease)    Scoliosis    Splenomegaly    Uterine fibroid     Past Surgical History:  Procedure Laterality Date   ABDOMINAL HYSTERECTOMY     BACK  SURGERY     COLONOSCOPY  04/27/2006   Wohl-colitis, internal hemorrhoids   COLONOSCOPY  02/2013   focal right colitis, left colon biopsy negative, focal active proctitis without chronicity   COLONOSCOPY WITH PROPOFOL N/A 07/16/2022   Procedure: COLONOSCOPY WITH PROPOFOL;  Surgeon: Toney Reil, MD;  Location: [facility] ENDOSCOPY;  Service: Gastroenterology;  Laterality: N/A;   ESOPHAGOGASTRODUODENOSCOPY  02/2013   multiple peptic & duodenal  ulcers, negative h pylori   INTRAUTERINE DEVICE INSERTION  2012   LAPAROSCOPIC ENDOMETRIOSIS FULGURATION  2015   [name]   OVARIAN CYST REMOVAL       Current Outpatient Medications:    albuterol (VENTOLIN HFA) 108 (90 Base) MCG/ACT inhaler, Inhale 2 puffs into the lungs every 6 (six) hours as needed for wheezing or shortness of breath., Disp: 8 g, Rfl: 5   budesonide-formoterol (SYMBICORT) 160-4.5 MCG/ACT inhaler, Inhale 2 puffs into the lungs 2 (two) times daily., Disp: 10.2 g, Rfl: 12   buPROPion (WELLBUTRIN) 75 MG tablet, Take 1 tablet (75 mg total) by mouth daily with breakfast., Disp: 90 tablet, Rfl: 1   cetirizine (ZYRTEC ALLERGY) 10 MG tablet, Take 1 tablet (10 mg total) by mouth daily., Disp: 90 tablet, Rfl: 1   cyanocobalamin (VITAMIN B12) 1000 MCG tablet, Take 1 tablet (1,000 mcg total) by mouth daily., Disp: 90 tablet, Rfl: 2   docusate sodium (COLACE) 100 MG capsule, TAKE 1 CAPSULE BY MOUTH ONCE DAILY FOR STOOL SOFTENER, Disp: 90 capsule, Rfl: 1   EPINEPHrine 0.3 mg/0.3 mL IJ SOAJ injection, USE FOR ANAPHYLAXIS, Disp: 1 each, Rfl: 5   FLUoxetine (PROZAC) 10 MG capsule, Take 1 capsule (10 mg total) by mouth daily with breakfast., Disp: 30 capsule, Rfl: 1   fluticasone (FLONASE) 50 MCG/ACT nasal spray, Place 1 spray into both nostrils daily., Disp: 16 g, Rfl: 5   fluticasone (FLOVENT HFA) 220 MCG/ACT inhaler, INHALE 2 PUFFS INTO THE LUNGS, 2 TIMES PER DAY. RINSE MOUTH AFTER INHALATIONS, Disp: 12 g, Rfl: 11   Ipratropium-Albuterol (COMBIVENT RESPIMAT) 20-100 MCG/ACT AERS respimat, Inhale 1 puff into the lungs every 6 (six) hours as needed for wheezing., Disp: 4 g, Rfl: 6   methocarbamol (ROBAXIN) 500 MG tablet, Take 1 tablet by mouth as needed for back spasms per Ortho., Disp: , Rfl:    omeprazole (PRILOSEC) 40 MG capsule, TAKE 1 CAPSULE BY MOUTH TWICE A DAY, Disp: 60 capsule, Rfl: 5   ondansetron (ZOFRAN-ODT) 4 MG disintegrating tablet, Take 1 tablet (4 mg total) by mouth every 8  (eight) hours as needed for nausea or vomiting., Disp: 20 tablet, Rfl: 0   promethazine (PHENERGAN) 25 MG suppository, Place 1 suppository (25 mg total) rectally every 6 (six) hours as needed for nausea or vomiting., Disp: 12 each, Rfl: 0   topiramate (TOPAMAX) 50 MG tablet, TAKE 1 TABLET BY MOUTH EVERY EVENING, Disp: 30 tablet, Rfl: 5   traMADol (ULTRAM) 50 MG tablet, ONE TAB PO BID PRN FOR PAIN AND HEADCAHES, Disp: 60 tablet, Rfl: 2   traZODone (DESYREL) 50 MG tablet, Take 0.5 tablets (25 mg total) by mouth at bedtime., Disp: 45 tablet, Rfl: 1   triamcinolone cream (KENALOG) 0.1 %, Apply 1 Application topically 2 (two) times daily as needed (itching and rash)., Disp: 453.6 g, Rfl: 1   Vitamin D, Ergocalciferol, (DRISDOL) 1.25 MG (50000 UNIT) CAPS capsule, Take 1 capsule (50,000 Units total) by mouth every 7 (seven) days., Disp: 12 capsule, Rfl: 1   tamsulosin (FLOMAX) 0.4 MG CAPS capsule, , Disp: ,  Rfl:    Family History  Problem Relation Age of Onset   Breast cancer Mother 77   COPD Father    Lung cancer Father 26   Dementia Father    Diabetes Sister    Diabetes Brother    Ovarian cancer Paternal Aunt    Colon cancer Maternal Grandfather 57   Liver disease Maternal Aunt        ?etiology   Lymphoma Maternal Grandmother 3   Lung cancer Other 63       maternal great grandfather   Stomach cancer Other 6       maternal great grandmother     Social History   Tobacco Use   Smoking status: Never   Smokeless tobacco: Never  Vaping Use   Vaping status: Never Used  Substance Use Topics   Alcohol use: Not Currently    Alcohol/week: 0.0 standard drinks of alcohol   Drug use: No    Allergies as of 03/24/2023 - Review Complete 03/24/2023  Allergen Reaction Noted   Other Anaphylaxis 08/30/2013   Peanut butter flavoring agent (non-screening) Swelling 08/24/2014   Sertraline Other (See Comments) 11/26/2014   Codeine Nausea And Vomiting 12/10/2011   Paroxetine Other (See Comments)  09/28/2014   Paroxetine hcl Other (See Comments) 12/10/2011   Prednisone & diphenhydramine Rash 07/23/2021   Venlafaxine Other (See Comments) 09/28/2014   Zoloft [sertraline hcl] Other (See Comments) 12/10/2011    Review of Systems:    All systems reviewed and negative except where noted in HPI.   Physical Exam:  BP 126/87 (BP Location: Left Arm, Patient Position: Sitting, Cuff Size: Normal)   Pulse 80   Temp 97.6 F (36.4 C) (Oral)   Ht 5\' 3"  (1.6 m)   Wt 207 lb 4 oz (94 kg)   LMP 12/06/2015   BMI 36.71 kg/m  Patient's last menstrual period was 12/06/2015.  General:   Alert,  Well-developed, well-nourished, pleasant and cooperative in NAD Head:  Normocephalic and atraumatic. Eyes:  Sclera clear, no icterus.   Conjunctiva pink. Ears:  Normal auditory acuity. Nose:  No deformity, discharge, or lesions. Mouth:  No deformity or lesions,oropharynx pink & moist. Neck:  Supple; no masses or thyromegaly. Lungs:  Respirations even and unlabored.  Clear throughout to auscultation.   No wheezes, crackles, or rhonchi. No acute distress. Heart:  Regular rate and rhythm; no murmurs, clicks, rubs, or gallops. Abdomen:  Normal bowel sounds. Soft, obese, left lower quadrant tenderness and non-distended without masses, hepatosplenomegaly or hernias noted.  No guarding or rebound tenderness.   Rectal: Not performed Msk:  Symmetrical without gross deformities. Good, equal movement & strength bilaterally. Pulses:  Normal pulses noted. Extremities:  No clubbing or edema.  No cyanosis. Neurologic:  Alert and oriented x3;  grossly normal neurologically. Skin:  Intact without significant lesions or rashes. No jaundice. Psych:  Alert and cooperative. Normal mood and affect.  Imaging Studies: Reviewed  Assessment and Plan:   Teresa Crawford is a 37 y.o. female with history of MR, obesity, chronic GERD, endometriosis, total hysterectomy is seen in consultation for 2 months history of difficulty  swallowing despite being on omeprazole 40 mg p.o. twice daily  Recommend EGD with esophageal biopsies Continue omeprazole 40 mg twice daily before meals Discussed about antireflux lifestyle Advised patient to completely stop carbonated beverages  Follow up after above workup   [name], MD

## 2023-03-26 ENCOUNTER — Ambulatory Visit (INDEPENDENT_AMBULATORY_CARE_PROVIDER_SITE_OTHER): Payer: Medicare Other | Admitting: Licensed Clinical Social Worker

## 2023-03-26 DIAGNOSIS — F331 Major depressive disorder, recurrent, moderate: Secondary | ICD-10-CM | POA: Diagnosis not present

## 2023-03-26 DIAGNOSIS — F4310 Post-traumatic stress disorder, unspecified: Secondary | ICD-10-CM | POA: Diagnosis not present

## 2023-03-26 NOTE — Progress Notes (Signed)
THERAPIST PROGRESS NOTE  Virtual Visit via Video Note  I connected with Teresa Crawford on 03/26/23 at 11:00 AM EST by a video enabled telemedicine application and verified that I am speaking with the correct person using two identifiers.  Location: Patient: Location on File  Provider: [facility]   I discussed the limitations of evaluation and management by telemedicine and the availability of in person appointments. The patient expressed understanding and agreed to proceed.   I discussed the assessment and treatment plan with the patient. The patient was provided an opportunity to ask questions and all were answered. The patient agreed with the plan and demonstrated an understanding of the instructions.   The patient was advised to call back or seek an in-person evaluation if the symptoms worsen or if the condition fails to improve as anticipated.  I provided 41 minutes of non-face-to-face time during this encounter.   Dereck Leep, LCSW   Session Time: 11:02am-11:43am  Participation Level: Active  Behavioral Response: CasualAlertDepressed  Type of Therapy: Individual Therapy  Treatment Goals addressed:      Goal: LTG: [place] will score less than 5 on the Generalized Anxiety Disorder 7 Scale (GAD-7)                         Goal: LTG: Pt identified she would like to decrease fear as a result of thought that something bad will happen.           Goal: LTG: Reduce frequency, intensity, and duration of depression symptoms so that daily functioning is improved    ProgressTowards Goals: Progressing  Interventions: CBT, Supportive, and Other: Psychoeducation on Mindfulness Coping Skills   Summary: Teresa Crawford is a 37 y.o. female who presents with symptoms of anxiety and bereavement.  Patient identifies symptoms to include uncontrollable worry, negative self affect, low mood, fatigue, tearfulness, frequent memories related to the passing of her loved ones, avoidance  related to visiting spaces where her loved ones passed and isolating from family. Pt was oriented times 5. Pt was cooperative and engaged. Pt denies SI/HI/AVH.     Pt utilized therapeutic space to discuss her feelings about the upcoming holiday. Pt reports she is feeling uncertain about attending her family's event due to avoidance around the holidays citing she often spends the holidays alone. The holidays are difficult due to reminders of all those who have passed. Discussed how grief is impacting her mood and how she is coping with the recent loss. Will be around her family for the first time since her cousins passing and she is experiencing feelings of guilt about missing his funeral due to her diagnosis of COVID. Reports she is anxious she will experience a panic attack. Reviewed coping skills. Educated pt on the five senses grounding exercise. Coping Skills she has used to work through her grief and anxiety include: positive distractions, positive music, diamond art, remembering positive memories.  Reflected on ways her faith has helped in coping with her grief. Identified she often blames herself because she feels she missed opportunities to help those who have passed. Worked with cln to challenge these thoughts. Aims to write a letter to her uncle to express her feelings and process her grief.   Has upcoming procedure due to difficulty swallowing. Addressed concerns about her health.   Suicidal/Homicidal: Nowithout intent/plan  Therapist Response: Clinician utilized active and supportive reflection to create a safe environment for patient to process recent life stressors.  Clinician  assessed for current stressors, symptoms, and safety since last session.  Clinician utilized CBT techniques to challenge unhelpful thoughts related to misplaced guilt.  Educated patient on the stages of grief and worked with patient to process her journey grieving the loss of her loved ones.  Provided psychoeducation on  mindfulness coping skills such as the 5 senses exercise.  Plan: Return again in 4 weeks.  Diagnosis:MDD (major depressive disorder), recurrent episode, moderate (HCC)  Posttraumatic stress disorder   Collaboration of Care: [facility] psychiatrist can access notes and cln. Will review psychiatrists' notes. Check in with the patient and will see LCSW per availability. Patient agreed with treatment recommendations.   Patient/Guardian was advised Release of Information must be obtained prior to any record release in order to collaborate their care with an outside provider. Patient/Guardian was advised if they have not already done so to contact the registration department to sign all necessary forms in order for [place] to release information regarding their care.   Consent: Patient/Guardian gives verbal consent for treatment and assignment of benefits for services provided during this visit. Patient/Guardian expressed understanding and agreed to proceed.   Dereck Leep, LCSW 03/26/2023

## 2023-03-29 ENCOUNTER — Other Ambulatory Visit: Payer: Self-pay | Admitting: Nurse Practitioner

## 2023-03-29 ENCOUNTER — Telehealth: Payer: Self-pay

## 2023-03-29 DIAGNOSIS — R1114 Bilious vomiting: Secondary | ICD-10-CM

## 2023-03-29 DIAGNOSIS — Z79899 Other long term (current) drug therapy: Secondary | ICD-10-CM

## 2023-03-29 NOTE — Telephone Encounter (Signed)
Patient left a voicemail and states she needs to reschedule her EGD that she schedule for 04/29/2023. Called patient back and her staff can not bring her that day. Reschedule patient to 05/12/2023. Sent out new instructions to patient. Called endo and talk to Raynelle Fanning to get patient reschedule

## 2023-04-01 ENCOUNTER — Telehealth: Payer: Self-pay | Admitting: Gastroenterology

## 2023-04-01 ENCOUNTER — Telehealth: Payer: Self-pay

## 2023-04-01 DIAGNOSIS — R197 Diarrhea, unspecified: Secondary | ICD-10-CM | POA: Diagnosis not present

## 2023-04-01 DIAGNOSIS — U071 COVID-19: Secondary | ICD-10-CM | POA: Diagnosis not present

## 2023-04-01 DIAGNOSIS — R112 Nausea with vomiting, unspecified: Secondary | ICD-10-CM | POA: Diagnosis not present

## 2023-04-01 DIAGNOSIS — J029 Acute pharyngitis, unspecified: Secondary | ICD-10-CM | POA: Diagnosis not present

## 2023-04-01 NOTE — Telephone Encounter (Signed)
Patient called in to schedule appointment with Dr. Allegra Lai.

## 2023-04-01 NOTE — Telephone Encounter (Signed)
Patient's caregiver taking patient to urgent care. She has been having N/V and blood in urine for 4 days.

## 2023-04-02 ENCOUNTER — Telehealth: Payer: Self-pay

## 2023-04-02 DIAGNOSIS — F330 Major depressive disorder, recurrent, mild: Secondary | ICD-10-CM

## 2023-04-02 DIAGNOSIS — F4310 Post-traumatic stress disorder, unspecified: Secondary | ICD-10-CM

## 2023-04-02 MED ORDER — FLUOXETINE HCL 10 MG PO CAPS: 10.0000 mg | ORAL_CAPSULE | Freq: Every day | ORAL | 0 refills | Status: DC

## 2023-04-02 NOTE — Telephone Encounter (Signed)
received fax requesting a refill on the [facility] . pt was last seen on 11-20 next appt 1-9. pt last filled medication on 12-3 but will not have enough until her next appt.

## 2023-04-12 DIAGNOSIS — M5451 Vertebrogenic low back pain: Secondary | ICD-10-CM | POA: Diagnosis not present

## 2023-04-15 ENCOUNTER — Telehealth (INDEPENDENT_AMBULATORY_CARE_PROVIDER_SITE_OTHER): Payer: Medicare Other | Admitting: Psychiatry

## 2023-04-15 ENCOUNTER — Encounter: Payer: Self-pay | Admitting: Psychiatry

## 2023-04-15 DIAGNOSIS — F4310 Post-traumatic stress disorder, unspecified: Secondary | ICD-10-CM | POA: Diagnosis not present

## 2023-04-15 DIAGNOSIS — G4710 Hypersomnia, unspecified: Secondary | ICD-10-CM

## 2023-04-15 DIAGNOSIS — F79 Unspecified intellectual disabilities: Secondary | ICD-10-CM

## 2023-04-15 DIAGNOSIS — F330 Major depressive disorder, recurrent, mild: Secondary | ICD-10-CM

## 2023-04-15 MED ORDER — FLUOXETINE HCL 10 MG PO CAPS: 10.0000 mg | ORAL_CAPSULE | Freq: Every day | ORAL | 0 refills | Status: DC

## 2023-04-15 MED ORDER — BUPROPION HCL ER (XL) 150 MG PO TB24: 150.0000 mg | ORAL_TABLET | Freq: Every morning | ORAL | 1 refills | Status: DC

## 2023-04-15 NOTE — Progress Notes (Signed)
 Virtual Visit via Video Note  I connected with Teresa Crawford on 04/15/23 at  1:00 PM EST by a video enabled telemedicine application and verified that I am speaking with the correct person using two identifiers.  Location Provider Location : [facility] Patient Location : Home  Participants: Patient , Provider    I discussed the limitations of evaluation and management by telemedicine and the availability of in person appointments. The patient expressed understanding and agreed to proceed.    I discussed the assessment and treatment plan with the patient. The patient was provided an opportunity to ask questions and all were answered. The patient agreed with the plan and demonstrated an understanding of the instructions.   The patient was advised to call back or seek an in-person evaluation if the symptoms worsen or if the condition fails to improve as anticipated.    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  The patient with a history of gastrointestinal issues, depression, and recurrent COVID-19 infections, presents with persistent gastrointestinal symptoms and increased fatigue. Over the holidays, the patient experienced a week-long episode of vomiting and non-stop diarrhea, initially attributed to a stomach virus. Despite the patient's efforts to manage these symptoms, she persisted, prompting a visit to urgent care where a second COVID-19 infection was diagnosed.  The patient's gastrointestinal symptoms have been a concern, with difficulty swallowing and a suspected worsening of pre-existing GI issues. A  recent consultation with a gastroenterologist led to a recommendation for an upper endoscopy, which the patient has yet to undergo.  The patient's mental health has also been a concern. Despite being on Prozac  10mg , the patient reports a resurgence of depressive symptoms, likely exacerbated by the recent COVID-19 diagnosis and subsequent isolation. The patient has been attending therapy sessions and reports a positive experience with the therapist.  The patient also reports excessive daytime sleepiness, with difficulty waking up in the mornings. This symptom has been persistent and seems to worsen with each COVID-19 infection. The patient is currently on Wellbutrin , which is being considered for a dosage increase to help manage this symptom.  In addition to these concerns, the patient has been dealing with lower back pain. After a consultation with a chiropractor, a muscle spasm was suspected. The patient has started physical therapy, which has led to some improvement, particularly in the ability to lay down without significant pain. However, the patient still experiences discomfort, particularly in the mornings.  Patient denies any suicidality, homicidality or perceptual disturbances.    Visit Diagnosis:    ICD-10-CM   1. MDD (major depressive disorder), recurrent episode, mild (HCC)  F33.0 buPROPion  (WELLBUTRIN  XL) 150 MG 24 hr tablet    FLUoxetine  (PROZAC ) 10 MG capsule    2. Posttraumatic stress disorder  F43.10 FLUoxetine  (PROZAC ) 10 MG capsule    3. Hypersomnia  G47.10     4. Intellectual disability  F79    Likely mild      Past Psychiatric History: I have reviewed past psychiatric history from progress note on 08/16/2018.  Past Medical History:  Past Medical History:  Diagnosis Date  B12 deficiency anemia    Chronic back pain    Chronic fatigue syndrome    Constipation    Depression    Elevated liver enzymes    Endometriosis    Fibroids    Hepatic steatosis     Hypothyroid    Last menstrual period (LMP) > 10 days ago 09/12/15   Mental retardation    Migraine    Post traumatic stress disorder    Posttraumatic stress disorder    PUD (peptic ulcer disease)    Scoliosis    Splenomegaly    Uterine fibroid     Past Surgical History:  Procedure Laterality Date   ABDOMINAL HYSTERECTOMY     BACK SURGERY     COLONOSCOPY  04/27/2006   Wohl-colitis, internal hemorrhoids   COLONOSCOPY  02/2013   focal right colitis, left colon biopsy negative, focal active proctitis without chronicity   COLONOSCOPY WITH PROPOFOL  N/A 07/16/2022   Procedure: COLONOSCOPY WITH PROPOFOL ;  Surgeon: Unk Corinn Skiff, MD;  Location: [facility] END[facility]COPY;  Service: Gastroenterology;  Laterality: N/A;   ESOPHAGOGASTRODUODENOSCOPY  02/2013   multiple peptic & duodenal ulcers, negative h pylori   INTRAUTERINE DEVICE INSERTION  2012   LAPAROSCOPIC ENDOMETRIOSIS FULGURATION  2015   [name]   OVARIAN CYST REMOVAL      Family Psychiatric History: I have reviewed family psychiatric history from progress note on 08/16/2018.  Family History:  Family History  Problem Relation Age of Onset   Breast cancer Mother 35   COPD Father    Lung cancer Father 20   Dementia Father    Diabetes Sister    Diabetes Brother    Ovarian cancer Paternal Aunt    Colon cancer Maternal Grandfather 89   Liver disease Maternal Aunt        ?etiology   Lymphoma Maternal Grandmother 9   Lung cancer Other 65       maternal great grandfather   Stomach cancer Other 27       maternal great grandmother    Social History: I have reviewed social history from progress note on 08/16/2018. Social History   Socioeconomic History   Marital status: Single    Spouse name: Not on file   Number of children: 0   Years of education: Not on file   Highest education level: Not on file  Occupational History   Not on file  Tobacco Use   Smoking status: Never   Smokeless tobacco: Never  Vaping Use   Vaping status:  Never Used  Substance and Sexual Activity   Alcohol use: Not Currently    Alcohol/week: 0.0 standard drinks of alcohol   Drug use: No   Sexual activity: Not Currently  Other Topics Concern   Not on file  Social History Narrative   She has brother, half brother & sister live nearby   Both parents deceased   Care managed by [facility]   Social Drivers of Health   Financial Resource Strain: Low Risk  (02/11/2017)   Overall Financial Resource Strain (CARDIA)    Difficulty of Paying Living Expenses: Not hard at all  Food Insecurity: No Food Insecurity (02/11/2017)   Hunger Vital Sign    Worried About Running Out of Food in the Last Year: Never true    Ran Out of Food in the Last Year: Never true  Transportation Needs: No Transportation Needs (02/11/2017)   PRAPARE - Administrator, Civil Service (Medical): No    Lack of Transportation (Non-Medical):  No  Physical Activity: Inactive (02/11/2017)   Exercise Vital Sign    Days of Exercise per Week: 0 days    Minutes of Exercise per Session: 0 min  Stress: No Stress Concern Present (02/11/2017)   [employer] of Occupational Health - Occupational Stress Questionnaire    Feeling of Stress : Not at all  Social Connections: Unknown (02/11/2017)   Social Connection and Isolation Panel [NHANES]    Frequency of Communication with Friends and Family: Patient declined    Frequency of Social Gatherings with Friends and Family: Patient declined    Attends Religious Services: Patient declined    Database Administrator or Organizations: Patient declined    Attends Banker Meetings: Patient declined    Marital Status: Patient declined    Allergies:  Allergies  Allergen Reactions   Other Anaphylaxis    Peanut butter, uses Epi pen   Peanut Butter Flavoring Agent (Non-Screening) Swelling    Throat swells Throat swells   Sertraline  Other (See Comments)    Panic attacks   Codeine  Nausea And Vomiting    nausea    Paroxetine Other (See Comments)    Panic Attack    Paroxetine Hcl Other (See Comments)    Panic attack Other reaction(s): Other (See Comments) Panic attacks   Prednisone  & Diphenhydramine  Rash   Venlafaxine Other (See Comments)    Panic Attack    Zoloft  [Sertraline  Hcl] Other (See Comments)    Panic attack    Metabolic Disorder Labs: Lab Results  Component Value Date   HGBA1C 5.5 12/20/2019   No results found for: PROLACTIN Lab Results  Component Value Date   CHOL 155 01/15/2022   TRIG 97 01/15/2022   HDL 34 (L) 01/15/2022   CHOLHDL 4.6 (H) 01/15/2022   LDLCALC 103 (H) 01/15/2022   LDLCALC 99 12/27/2020   Lab Results  Component Value Date   TSH 3.060 12/28/2022   TSH 1.990 01/15/2022    Therapeutic Level Labs: No results found for: LITHIUM No results found for: VALPROATE No results found for: CBMZ  Current Medications: Current Outpatient Medications  Medication Sig Dispense Refill   buPROPion  (WELLBUTRIN  XL) 150 MG 24 hr tablet Take 1 tablet (150 mg total) by mouth in the morning. Stop Bupropion  75 mg 30 tablet 1   albuterol  (VENTOLIN  HFA) 108 (90 Base) MCG/ACT inhaler Inhale 2 puffs into the lungs every 6 (six) hours as needed for wheezing or shortness of breath. 8 g 5   budesonide -formoterol  (SYMBICORT ) 160-4.5 MCG/ACT inhaler Inhale 2 puffs into the lungs 2 (two) times daily. 10.2 g 12   cetirizine  (ZYRTEC  ALLERGY) 10 MG tablet Take 1 tablet (10 mg total) by mouth daily. 90 tablet 1   cyanocobalamin  (VITAMIN B12) 1000 MCG tablet Take 1 tablet (1,000 mcg total) by mouth daily. 90 tablet 2   docusate sodium  (COLACE) 100 MG capsule TAKE 1 CAPSULE BY MOUTH ONCE DAILY FOR STOOL SOFTENER 90 capsule 1   EPINEPHrine  0.3 mg/0.3 mL IJ SOAJ injection USE FOR ANAPHYLAXIS 1 each 5   FLUoxetine  (PROZAC ) 10 MG capsule Take 1 capsule (10 mg total) by mouth daily with breakfast. 90 capsule 0   fluticasone  (FLONASE ) 50 MCG/ACT nasal spray Place 1 spray into both nostrils  daily. 16 g 5   fluticasone  (FLOVENT  HFA) 220 MCG/ACT inhaler INHALE 2 PUFFS INTO THE LUNGS, 2 TIMES PER DAY. RINSE MOUTH AFTER INHALATIONS 12 g 11   Ipratropium-Albuterol  (COMBIVENT  RESPIMAT) 20-100 MCG/ACT AERS respimat Inhale 1 puff into the  lungs every 6 (six) hours as needed for wheezing. 4 g 6   methocarbamol (ROBAXIN) 500 MG tablet Take 1 tablet by mouth as needed for back spasms per Ortho.     omeprazole  (PRILOSEC) 40 MG capsule TAKE 1 CAPSULE BY MOUTH TWICE A DAY 60 capsule 5   ondansetron  (ZOFRAN -ODT) 4 MG disintegrating tablet Take 1 tablet (4 mg total) by mouth every 8 (eight) hours as needed for nausea or vomiting. 20 tablet 0   promethazine  (PHENERGAN ) 25 MG suppository Place 1 suppository (25 mg total) rectally every 6 (six) hours as needed for nausea or vomiting. 12 each 0   tamsulosin  (FLOMAX ) 0.4 MG CAPS capsule      topiramate  (TOPAMAX ) 50 MG tablet TAKE 1 TABLET BY MOUTH EVERY EVENING 30 tablet 5   traMADol  (ULTRAM ) 50 MG tablet ONE TAB PO BID PRN FOR PAIN AND HEADCAHES 60 tablet 2   traZODone  (DESYREL ) 50 MG tablet Take 0.5 tablets (25 mg total) by mouth at bedtime. 45 tablet 1   triamcinolone  cream (KENALOG ) 0.1 % Apply 1 Application topically 2 (two) times daily as needed (itching and rash). 453.6 g 1   Vitamin D , Ergocalciferol , (DRISDOL ) 1.25 MG (50000 UNIT) CAPS capsule Take 1 capsule (50,000 Units total) by mouth every 7 (seven) days. 12 capsule 1   No current facility-administered medications for this visit.     Musculoskeletal: Strength & Muscle Tone:  UTA Gait & Station:  Seated Patient leans: N/A  Psychiatric Specialty Exam: Review of Systems  Constitutional:  Positive for fatigue.  Psychiatric/Behavioral:  Positive for dysphoric mood. The patient is nervous/anxious.     Last menstrual period 12/06/2015.There is no height or weight on file to calculate BMI.  General Appearance: Disheveled  Eye Contact:  Fair  Speech:  Clear and Coherent  Volume:  Normal   Mood:  Anxious and Dysphoric  Affect:  Congruent  Thought Process:  Goal Directed and Descriptions of Associations: Intact  Orientation:  Full (Time, Place, and Person)  Thought Content: Logical   Suicidal Thoughts:  No  Homicidal Thoughts:  No  Memory:  Immediate;   Fair Recent;   Fair Remote;   Fair  Judgement:  Fair  Insight:  Fair  Psychomotor Activity:  Normal  Concentration:  Concentration: Fair and Attention Span: Fair  Recall:  [employer] of Knowledge: Fair  Language: Fair  Akathisia:  No  Handed:  Right  AIMS (if indicated): not done  Assets:  Desire for Improvement Housing Social Support  ADL's:  Intact  Cognition: WNL  Sleep:   excessive   Screenings: AIMS    Flowsheet Row Video Visit from 01/20/2022 in [facility] Video Visit from 10/22/2021 in [facility]  AIMS Total Score 0 0      GAD-7    Flowsheet Row Office Visit from 02/24/2023 in [facility] Office Visit from 04/21/2022 in [facility] Video Visit from 01/20/2022 in [facility] Video Visit from 10/22/2021 in [facility] Office Visit from 04/08/2021 in [facility]  Total GAD-7 Score 0 0 1 0 0      Mini-Mental    Flowsheet Row Office Visit from 01/07/2023 in [facility] Office Visit from 01/01/2022 in [facility], [facility] Clinical Support from 12/25/2020 in [facility], [facility] Clinical Support from 12/19/2019 in [facility] Office Visit from 10/13/2017 in [facility]  Total Score (max 30 points ) 29 29 29 30 27       PHQ2-9    Flowsheet Row Office Visit from 03/09/2023 in [facility] Office Visit from 02/24/2023 in [facility] Office Visit from 01/07/2023 in [facility], [facility] Office Visit from 04/21/2022 in [facility] Video Visit from 01/20/2022 in [facility]  PHQ-2 Total Score 0 0 3 0 0  PHQ-9 Total Score -- 5 7 0 --      Flowsheet Row Video Visit from 04/15/2023 in [facility] Office Visit from 02/24/2023 in [facility] ED from 11/10/2022 in [facility] Emergency Department at [facility]  C-SSRS RISK CATEGORY No Risk No Risk No Risk        Assessment and Plan: Teresa Crawford is a 38 year old Caucasian female with a history of MDD, PTSD, insomnia, currently with multiple medical problems including recent COVID-19 infection, long COVID symptoms as well as GI problems, with worsening fatigue and depression symptoms discussed assessment and plan as noted below.  Depression-unstable Reports feeling depressed, particularly due to isolation from recurrent COVID-19 infections. Currently on fluoxetine  10 mg with no side effects. Prefers to continue therapy before increasing medication dosage. Discussed increasing fluoxetine  to 20 mg as 10 mg may not be effective for adults, but patient prefers to continue therapy first. - Continue fluoxetine  10 mg daily - Increase Wellbutrin  to Wellbutrin  XL 150 mg daily. - Continue therapy sessions with Ms.Perkins - Reassess mental health status in 4 weeks  PTSD-improving Patient currently denies any significant intrusive memories flashbacks or other PTSD symptoms. - Continue fluoxetine  10 mg daily - Continue trazodone  25 mg at bedtime-hold if having excessive sleepiness during the day. - Continue CBT with therapist.  Excessive daytime sleepiness/hypersomnia-unstable Reports excessive sleepiness and difficulty waking up, potentially exacerbated by recurrent COVID-19  infections. No prior sleep study conducted. Discussed increasing bupropion  to 150 mg extended release to improve energy and motivation. Informed that it may take 4 weeks or more to see improvement. - Increase bupropion  to 150 mg extended release daily in the morning - Monitor for improvement in energy levels over the next 4 weeks - Consider sleep study referral in the future.  Patient provided education regarding medication side effects including interaction with tramadol , serotonin syndrome.   Follow-up in clinic in 3 to 4 weeks or sooner if needed.  Collaboration of Care: Collaboration of Care: Referral or follow-up with counselor/therapist AEB patient to continue CBT and follow-up with primary provider/GI provider.  Patient/Guardian was advised Release of Information must be obtained prior to any record release in order to collaborate their care with an outside provider. Patient/Guardian was advised if they have not already done so to contact the registration department to sign all necessary forms in order for us  to release information regarding their care.   Consent: Patient/Guardian gives verbal consent for treatment and assignment of benefits for services provided during this visit. Patient/Guardian expressed understanding and agreed to proceed.   This note was generated in part or whole with voice recognition software. Voice recognition is usually quite accurate but there are transcription errors that can and very often do occur. I apologize for any typographical errors that were not detected and corrected.    Ngai Parcell, MD 04/16/2023, 9:24 AM

## 2023-04-16 DIAGNOSIS — F330 Major depressive disorder, recurrent, mild: Secondary | ICD-10-CM | POA: Insufficient documentation

## 2023-04-20 ENCOUNTER — Encounter: Payer: Self-pay | Admitting: Internal Medicine

## 2023-04-20 ENCOUNTER — Ambulatory Visit (INDEPENDENT_AMBULATORY_CARE_PROVIDER_SITE_OTHER): Payer: Medicare Other | Admitting: Internal Medicine

## 2023-04-20 VITALS — BP 130/98 | HR 84 | Temp 98.0°F | Resp 16 | Ht 63.0 in | Wt 203.4 lb

## 2023-04-20 DIAGNOSIS — M545 Low back pain, unspecified: Secondary | ICD-10-CM | POA: Diagnosis not present

## 2023-04-20 DIAGNOSIS — F3341 Major depressive disorder, recurrent, in partial remission: Secondary | ICD-10-CM | POA: Diagnosis not present

## 2023-04-20 DIAGNOSIS — G4710 Hypersomnia, unspecified: Secondary | ICD-10-CM | POA: Diagnosis not present

## 2023-04-20 DIAGNOSIS — Z9189 Other specified personal risk factors, not elsewhere classified: Secondary | ICD-10-CM | POA: Diagnosis not present

## 2023-04-20 NOTE — Progress Notes (Signed)
 [facility] [address] [place], [place] [address]  Internal MEDICINE  Office Visit Note  Patient Name: Teresa Crawford  [id number]  [id number]  Date of Service: 05/05/2023  Chief Complaint  Patient presents with   Follow-up   Depression   Anxiety   Quality Metric Gaps    Pneumonia Vaccine    HPI Pt is seen for routine follow up Has difficulty swallowing, app with GI is soon C/o cough as well Pt is taking multiple medications and has gained wt Has difficulty moving around  Back pain is improved, did see ortho     Current Medication: Outpatient Encounter Medications as of 04/20/2023  Medication Sig   benzonatate  (TESSALON ) 200 MG capsule Take 200 mg by mouth 2 (two) times daily as needed for cough.   buPROPion  (WELLBUTRIN  XL) 150 MG 24 hr tablet Take 1 tablet (150 mg total) by mouth in the morning. Stop Bupropion  75 mg   cetirizine  (ZYRTEC  ALLERGY) 10 MG tablet Take 1 tablet (10 mg total) by mouth daily.   cyanocobalamin  (VITAMIN B12) 1000 MCG tablet Take 1 tablet (1,000 mcg total) by mouth daily.   diphenhydrAMINE  (BENADRYL ) 25 mg capsule Take 25 mg by mouth every 6 (six) hours as needed for allergies.   docusate sodium  (COLACE) 100 MG capsule TAKE 1 CAPSULE BY MOUTH ONCE DAILY FOR STOOL SOFTENER   EPINEPHrine  0.3 mg/0.3 mL IJ SOAJ injection USE FOR ANAPHYLAXIS   methocarbamol (ROBAXIN) 500 MG tablet Take 1 tablet by mouth as needed for back spasms per Ortho.   omeprazole  (PRILOSEC) 40 MG capsule TAKE 1 CAPSULE BY MOUTH TWICE A DAY   ondansetron  (ZOFRAN -ODT) 4 MG disintegrating tablet Take 1 tablet (4 mg total) by mouth every 8 (eight) hours as needed for nausea or vomiting.   promethazine  (PHENERGAN ) 25 MG suppository Place 1 suppository (25 mg total) rectally every 6 (six) hours as needed for nausea or vomiting.   topiramate  (TOPAMAX ) 50 MG tablet TAKE 1 TABLET BY MOUTH EVERY EVENING   traMADol  (ULTRAM ) 50 MG tablet ONE TAB PO BID PRN FOR PAIN AND HEADCAHES    triamcinolone  cream (KENALOG ) 0.1 % Apply 1 Application topically 2 (two) times daily as needed (itching and rash).   Vitamin D , Ergocalciferol , (DRISDOL ) 1.25 MG (50000 UNIT) CAPS capsule Take 1 capsule (50,000 Units total) by mouth every 7 (seven) days.   [DISCONTINUED] albuterol  (VENTOLIN  HFA) 108 (90 Base) MCG/ACT inhaler Inhale 2 puffs into the lungs every 6 (six) hours as needed for wheezing or shortness of breath. (Patient not taking: Reported on 04/29/2023)   [DISCONTINUED] Ipratropium-Albuterol  (COMBIVENT  RESPIMAT) 20-100 MCG/ACT AERS respimat Inhale 1 puff into the lungs every 6 (six) hours as needed for wheezing. (Patient not taking: Reported on 04/27/2023)   [DISCONTINUED] tamsulosin  (FLOMAX ) 0.4 MG CAPS capsule  (Patient not taking: Reported on 04/27/2023)   [DISCONTINUED] budesonide -formoterol  (SYMBICORT ) 160-4.5 MCG/ACT inhaler Inhale 2 puffs into the lungs 2 (two) times daily. (Patient not taking: Reported on 04/27/2023)   [DISCONTINUED] FLUoxetine  (PROZAC ) 10 MG capsule Take 1 capsule (10 mg total) by mouth daily with breakfast. (Patient not taking: Reported on 04/27/2023)   [DISCONTINUED] fluticasone  (FLONASE ) 50 MCG/ACT nasal spray Place 1 spray into both nostrils daily. (Patient not taking: Reported on 04/27/2023)   [DISCONTINUED] fluticasone  (FLOVENT  HFA) 220 MCG/ACT inhaler INHALE 2 PUFFS INTO THE LUNGS, 2 TIMES PER DAY. RINSE MOUTH AFTER INHALATIONS (Patient not taking: Reported on 04/27/2023)   [DISCONTINUED] traZODone  (DESYREL ) 50 MG tablet Take 0.5 tablets (25 mg total)  by mouth at bedtime. (Patient not taking: Reported on 04/27/2023)   No facility-administered encounter medications on file as of 04/20/2023.    Surgical History: Past Surgical History:  Procedure Laterality Date   ABDOMINAL HYSTERECTOMY     BACK SURGERY     BIOPSY  04/29/2023   Procedure: BIOPSY;  Surgeon: Unk Corinn Skiff, MD;  Location: [facility] END[facility];  Service: Gastroenterology;;   COLONOSCOPY  04/27/2006    Wohl-colitis, internal hemorrhoids   COLONOSCOPY  02/2013   focal right colitis, left colon biopsy negative, focal active proctitis without chronicity   COLONOSCOPY WITH PROPOFOL  N/A 07/16/2022   Procedure: COLONOSCOPY WITH PROPOFOL ;  Surgeon: Unk Corinn Skiff, MD;  Location: [facility] ENDOSCOPY;  Service: Gastroenterology;  Laterality: N/A;   ESOPHAGOGASTRODUODENOSCOPY  02/2013   multiple peptic & duodenal ulcers, negative h pylori   ESOPHAGOGASTRODUODENOSCOPY (EGD) WITH PROPOFOL  N/A 04/29/2023   Procedure: ESOPHAGOGASTRODUODENOSCOPY (EGD) WITH PROPOFOL ;  Surgeon: Unk Corinn Skiff, MD;  Location: [facility] ENDOSCOPY;  Service: Gastroenterology;  Laterality: N/A;   INTRAUTERINE DEVICE INSERTION  2012   LAPAROSCOPIC ENDOMETRIOSIS FULGURATION  2015   [name]   OVARIAN CYST REMOVAL      Medical History: Past Medical History:  Diagnosis Date   B12 deficiency anemia    Chronic back pain    Chronic fatigue syndrome    Constipation    Depression    Elevated liver enzymes    Endometriosis    Fibroids    Hepatic steatosis    Hypothyroid    Last menstrual period (LMP) > 10 days ago 09/12/15   Mental retardation    Migraine    Post traumatic stress disorder    Posttraumatic stress disorder    PUD (peptic ulcer disease)    Scoliosis    Splenomegaly    Uterine fibroid     Family History: Family History  Problem Relation Age of Onset   Breast cancer Mother 72   COPD Father    Lung cancer Father 77   Dementia Father    Diabetes Sister    Diabetes Brother    Ovarian cancer Paternal Aunt    Colon cancer Maternal Grandfather 39   Liver disease Maternal Aunt        ?etiology   Lymphoma Maternal Grandmother 66   Lung cancer Other 40       maternal great grandfather   Stomach cancer Other 52       maternal great grandmother    Social History   Socioeconomic History   Marital status: Single    Spouse name: Not on file   Number of children: 0   Years of education: Not on file    Highest education level: Not on file  Occupational History   Not on file  Tobacco Use   Smoking status: Never   Smokeless tobacco: Never  Vaping Use   Vaping status: Never Used  Substance and Sexual Activity   Alcohol use: Not Currently    Alcohol/week: 0.0 standard drinks of alcohol   Drug use: No   Sexual activity: Not Currently  Other Topics Concern   Not on file  Social History Narrative   She has brother, half brother & sister live nearby   Both parents deceased   Care managed by [facility]   Social Drivers of Health   Financial Resource Strain: Low Risk  (02/11/2017)   Overall Financial Resource Strain (CARDIA)    Difficulty of Paying Living Expenses: Not hard at all  Food Insecurity: No Food Insecurity (  02/11/2017)   Hunger Vital Sign    Worried About Running Out of Food in the Last Year: Never true    Ran Out of Food in the Last Year: Never true  Transportation Needs: No Transportation Needs (02/11/2017)   PRAPARE - Administrator, Civil Service (Medical): No    Lack of Transportation (Non-Medical): No  Physical Activity: Inactive (02/11/2017)   Exercise Vital Sign    Days of Exercise per Week: 0 days    Minutes of Exercise per Session: 0 min  Stress: No Stress Concern Present (02/11/2017)   [employer] of Occupational Health - Occupational Stress Questionnaire    Feeling of Stress : Not at all  Social Connections: Unknown (02/11/2017)   Social Connection and Isolation Panel [NHANES]    Frequency of Communication with Friends and Family: Patient declined    Frequency of Social Gatherings with Friends and Family: Patient declined    Attends Religious Services: Patient declined    Database Administrator or Organizations: Patient declined    Attends Banker Meetings: Patient declined    Marital Status: Patient declined  Intimate Partner Violence: Unknown (02/11/2017)   Humiliation, Afraid, Rape, and Kick questionnaire    Fear of Current  or Ex-Partner: Patient declined    Emotionally Abused: Patient declined    Physically Abused: Patient declined    Sexually Abused: Patient declined      Review of Systems  Constitutional:  Negative for fatigue and fever.  HENT:  Negative for congestion, mouth sores and postnasal drip.   Respiratory:  Negative for cough.   Cardiovascular:  Negative for chest pain.  Genitourinary:  Negative for flank pain.  Psychiatric/Behavioral: Negative.      Vital Signs: BP (!) 130/98   Pulse 84   Temp 98 F (36.7 C)   Resp 16   Ht 5' 3 (1.6 m)   Wt 203 lb 6.4 oz (92.3 kg)   LMP 12/06/2015   SpO2 97%   BMI 36.03 kg/m    Physical Exam Constitutional:      Appearance: Normal appearance.  HENT:     Head: Normocephalic and atraumatic.     Nose: Nose normal.     Mouth/Throat:     Mouth: Mucous membranes are moist.     Pharynx: No posterior oropharyngeal erythema.  Eyes:     Extraocular Movements: Extraocular movements intact.     Pupils: Pupils are equal, round, and reactive to light.  Cardiovascular:     Pulses: Normal pulses.     Heart sounds: Normal heart sounds.  Pulmonary:     Effort: Pulmonary effort is normal.     Breath sounds: Normal breath sounds.  Neurological:     General: No focal deficit present.     Mental Status: She is alert.  Psychiatric:        Mood and Affect: Mood normal.        Behavior: Behavior normal.        Assessment/Plan: 1. Acute midline low back pain without sciatica (Primary) Improved   2. MDD (major depressive disorder), recurrent, in partial remission (HCC) Will DC prozac  and continue wellbutrin    3. Hypersomnolence Will monitor   4. At risk for polypharmacy Multiple medications are discontinued, will monitor her closely    General Counseling: Rosina oakland understanding of the findings of todays visit and agrees with plan of treatment. I have discussed any further diagnostic evaluation that may be needed or ordered today. We  also  reviewed her medications today. she has been encouraged to call the office with any questions or concerns that should arise related to todays visit.   Total time spent:35 Minutes Time spent includes review of chart, medications, test results, and follow up plan with the patient.   [place] Controlled Substance Database was reviewed by me.   Dr [facility]

## 2023-04-21 ENCOUNTER — Telehealth: Payer: Self-pay

## 2023-04-21 NOTE — Telephone Encounter (Signed)
 Faxed D/c order to [facility]

## 2023-04-23 ENCOUNTER — Ambulatory Visit (INDEPENDENT_AMBULATORY_CARE_PROVIDER_SITE_OTHER): Payer: Medicare Other | Admitting: Licensed Clinical Social Worker

## 2023-04-23 DIAGNOSIS — G4710 Hypersomnia, unspecified: Secondary | ICD-10-CM | POA: Diagnosis not present

## 2023-04-23 DIAGNOSIS — F4310 Post-traumatic stress disorder, unspecified: Secondary | ICD-10-CM | POA: Diagnosis not present

## 2023-04-23 DIAGNOSIS — M5451 Vertebrogenic low back pain: Secondary | ICD-10-CM | POA: Diagnosis not present

## 2023-04-23 DIAGNOSIS — F330 Major depressive disorder, recurrent, mild: Secondary | ICD-10-CM | POA: Diagnosis not present

## 2023-04-23 DIAGNOSIS — F79 Unspecified intellectual disabilities: Secondary | ICD-10-CM

## 2023-04-23 NOTE — Progress Notes (Signed)
THERAPIST PROGRESS NOTE Virtual Visit via Video Note  I connected with [name] on 04/23/23 at 11:04AM by a video enabled telemedicine application and verified that I am speaking with the correct person using two identifiers.  Location: Patient: Address on file Provider: Providers address   I discussed the limitations of evaluation and management by telemedicine and the availability of in person appointments. The patient expressed understanding and agreed to proceed.   I discussed the assessment and treatment plan with the patient. The patient was provided an opportunity to ask questions and all were answered. The patient agreed with the plan and demonstrated an understanding of the instructions.   The patient was advised to call back or seek an in-person evaluation if the symptoms worsen or if the condition fails to improve as anticipated.  I provided of non-face-to-face time during this encounter.   Dereck Leep, LCSW  Session Time: 11:04-11:52am  Participation Level: Active  Behavioral Response: CasualAlertEuthymic  Type of Therapy: Individual Therapy  Treatment Goals addressed:  Goal: LTG: [place] will score less than 5 on the Generalized Anxiety Disorder 7 Scale (GAD-7)                                 Goal: LTG: Pt identified she would like to decrease fear as a result of thought that something bad will happen.        ProgressTowards Goals: Progressing  Interventions: CBT, Supportive, and Reframing  Summary: Teresa Crawford is a 38 y.o. female who presents with symptoms of anxiety and bereavement.  Patient identifies symptoms to include uncontrollable worry, negative self affect, low mood, fatigue, tearfulness, frequent memories related to the passing of her loved ones, avoidance related to visiting spaces where her loved ones passed and isolating from family. Pt was oriented times 5. Pt was cooperative and engaged. Pt denies SI/HI/AVH.    Pt shared she is getting over COVID and was not able to spend time with her family over Christmas. She reports she has been using her 5-senses exercise, which has been helpful in addressing her worry and anxiety.   Pt reports she is moving towards accepting her loved ones are deceased and she is leaning on her spiritual beliefs to cope with their loss. Finding fulfillment through supporting and helping others. Identifies she has not felt prepared to construct letters to her deceased loved ones. Pt reports she has been doing work to move through the denial stage of grief. Has made progress where she no longer blames herself for the death of her loved ones. Reflected on ways she is reframing her negative thoughts.  Educated pt on 7/11 breathing to manage anxiety.   Suicidal/Homicidal: Nowithout intent/plan  Therapist Response: Clinician utilized active and supportive reflection to create a safe environment for patient to process recent life stressors. Clinician assessed for current stressors, symptoms, and safety since last session. Clinician utilized CBT techniques to challenge unhelpful thoughts related to misplaced guilt. Reviewed the stages of grief and worked with patient to process her journey grieving the loss of her loved ones. Provided psychoeducation on mindfulness coping skills such as the 5 senses exercise. Educated pt on 7/11 breathing.   Plan: Return again in 4 weeks.  Diagnosis: MDD (major depressive disorder), recurrent episode, mild (HCC)  Posttraumatic stress disorder  Hypersomnia  Intellectual disability   Collaboration of Care: AEB psychiatrist can access notes and cln. Will review psychiatrists' notes.  Check in with the patient and will see LCSW per availability. Patient agreed with treatment recommendations.   Patient/Guardian was advised Release of Information must be obtained prior to any record release in order to collaborate their care with an outside provider.  Patient/Guardian was advised if they have not already done so to contact the registration department to sign all necessary forms in order for [place] to release information regarding their care.   Consent: Patient/Guardian gives verbal consent for treatment and assignment of benefits for services provided during this visit. Patient/Guardian expressed understanding and agreed to proceed.   Dereck Leep, LCSW 04/23/2023

## 2023-04-26 ENCOUNTER — Other Ambulatory Visit: Payer: Self-pay

## 2023-04-27 ENCOUNTER — Encounter: Payer: Self-pay | Admitting: Gastroenterology

## 2023-04-27 ENCOUNTER — Ambulatory Visit (INDEPENDENT_AMBULATORY_CARE_PROVIDER_SITE_OTHER): Payer: Medicare Other | Admitting: Gastroenterology

## 2023-04-27 VITALS — BP 156/78 | HR 80 | Temp 97.3°F | Ht 63.0 in | Wt 208.2 lb

## 2023-04-27 DIAGNOSIS — R1310 Dysphagia, unspecified: Secondary | ICD-10-CM

## 2023-04-27 NOTE — Progress Notes (Unsigned)
[name], MD [address]  [address]  [place], [place] [address]  Main: [phone]  Fax: [phone]    Gastroenterology Consultation  Referring Provider:     Sallyanne Kuster, NP Primary Care Physician:  Sallyanne Kuster, NP Primary Gastroenterologist:  Dr. [name] Reason for Consultation: Difficulty swallowing        HPI:   Teresa Crawford is a 38 y.o. female referred by Sallyanne Kuster, NP  for consultation & management of left lower quadrant pain and diarrhea.  Patient went to ER on 05/15/2022 with abdominal pain, predominantly in the left lower quadrant area associated with loose bowel movements mixed with blood, nausea and vomiting.  She was originally seen by her PCP on 2/7, sent her to the ER.  Patient denies any fever, chills, nausea or vomiting.  Her labs including CBC, CMP and serum lipase were normal.  Patient is accompanied by her community coach who informed that they tried bowel regimen which helped her move her bowels after the ER visit.  She continues to drink carbonated beverages.  She does report abdominal bloating.  She has ongoing symptoms.  Patient was evaluated in 2015 for rectal bleeding and proctitis/colitis.  Fecal calprotectin levels, CRP and ESR were normal.  She was treated with cortisone enemas by [facility] gastroenterologist at that time  Follow-up visit 03/24/2023 Teresa Crawford is here to discuss about 2 months history of difficulty swallowing with both solids and liquids.  She reports that she has been eating only mashed potatoes which is leading to weight gain.  She is not able to swallow bread or any meat products.  She is taking omeprazole 40 mg twice daily, evaluated by ENT and was referred to GI for further evaluation.  She denies any heartburn, regurgitation.  She does report belching.  Patient does not smoke or drink alcohol  NSAIDs: None  Antiplts/Anticoagulants/Anti thrombotics: None  GI Procedures:  Colonoscopy  07/16/2022 - The examined portion of the ileum was normal. - Erythematous mucosa in the rectum, in the recto- sigmoid colon and in the sigmoid colon. Biopsied. - The distal rectum and anal verge are normal on retroflexion view. - The examination was otherwise normal. DIAGNOSIS:  A. COLON, SIGMOID; COLD BIOPSY:  - BENIGN COLONIC MUCOSA WITH NONSPECIFIC SUPERFICIAL SUBMUCOSAL  HYPEREMIA; OTHERWISE NO SIGNIFICANT HISTOPATHOLOGIC CHANGE.  - NEGATIVE FOR ACTIVE MUCOSAL COLITIS, MICROSCOPIC COLITIS, AND  ARCHITECTURAL CHANGES OF CHRONIC COLITIS.  - NEGATIVE FOR DYSPLASIA AND MALIGNANCY.   B. RECTUM; COLD BIOPSY:  - BENIGN RECTAL MUCOSA WITH NONSPECIFIC SUPERFICIAL SUBMUCOSAL  HYPEREMIA; OTHERWISE NO SIGNIFICANT HISTOPATHOLOGIC CHANGE.  - NEGATIVE FOR ACTIVE MUCOSAL PROCTITIS, MICROSCOPIC PROCTITIS, AND  ARCHITECTURAL CHANGES OF CHRONIC PROCTITIS.  - NEGATIVE FOR DYSPLASIA AND MALIGNANCY.    Past Medical History:  Diagnosis Date   B12 deficiency anemia    Chronic back pain    Chronic fatigue syndrome    Constipation    Depression    Elevated liver enzymes    Endometriosis    Fibroids    Hepatic steatosis    Hypothyroid    Last menstrual period (LMP) > 10 days ago 09/12/15   Mental retardation    Migraine    Post traumatic stress disorder    Posttraumatic stress disorder    PUD (peptic ulcer disease)    Scoliosis    Splenomegaly    Uterine fibroid     Past Surgical History:  Procedure Laterality Date   ABDOMINAL HYSTERECTOMY     BACK  SURGERY     COLONOSCOPY  04/27/2006   Wohl-colitis, internal hemorrhoids   COLONOSCOPY  02/2013   focal right colitis, left colon biopsy negative, focal active proctitis without chronicity   COLONOSCOPY WITH PROPOFOL N/A 07/16/2022   Procedure: COLONOSCOPY WITH PROPOFOL;  Surgeon: Toney Reil, MD;  Location: [facility] ENDOSCOPY;  Service: Gastroenterology;  Laterality: N/A;   ESOPHAGOGASTRODUODENOSCOPY  02/2013   multiple peptic & duodenal  ulcers, negative h pylori   INTRAUTERINE DEVICE INSERTION  2012   LAPAROSCOPIC ENDOMETRIOSIS FULGURATION  2015   [name]   OVARIAN CYST REMOVAL       Current Outpatient Medications:    albuterol (VENTOLIN HFA) 108 (90 Base) MCG/ACT inhaler, Inhale 2 puffs into the lungs every 6 (six) hours as needed for wheezing or shortness of breath., Disp: 8 g, Rfl: 5   benzonatate (TESSALON) 200 MG capsule, Take 200 mg by mouth 2 (two) times daily as needed for cough., Disp: , Rfl:    buPROPion (WELLBUTRIN XL) 150 MG 24 hr tablet, Take 1 tablet (150 mg total) by mouth in the morning. Stop Bupropion 75 mg, Disp: 30 tablet, Rfl: 1   cetirizine (ZYRTEC ALLERGY) 10 MG tablet, Take 1 tablet (10 mg total) by mouth daily., Disp: 90 tablet, Rfl: 1   cyanocobalamin (VITAMIN B12) 1000 MCG tablet, Take 1 tablet (1,000 mcg total) by mouth daily., Disp: 90 tablet, Rfl: 2   diphenhydrAMINE (BENADRYL) 25 mg capsule, Take 25 mg by mouth every 6 (six) hours as needed for allergies., Disp: , Rfl:    docusate sodium (COLACE) 100 MG capsule, TAKE 1 CAPSULE BY MOUTH ONCE DAILY FOR STOOL SOFTENER, Disp: 90 capsule, Rfl: 1   EPINEPHrine 0.3 mg/0.3 mL IJ SOAJ injection, USE FOR ANAPHYLAXIS, Disp: 1 each, Rfl: 5   methocarbamol (ROBAXIN) 500 MG tablet, Take 1 tablet by mouth as needed for back spasms per Ortho., Disp: , Rfl:    omeprazole (PRILOSEC) 40 MG capsule, TAKE 1 CAPSULE BY MOUTH TWICE A DAY, Disp: 60 capsule, Rfl: 5   ondansetron (ZOFRAN-ODT) 4 MG disintegrating tablet, Take 1 tablet (4 mg total) by mouth every 8 (eight) hours as needed for nausea or vomiting., Disp: 20 tablet, Rfl: 0   promethazine (PHENERGAN) 25 MG suppository, Place 1 suppository (25 mg total) rectally every 6 (six) hours as needed for nausea or vomiting., Disp: 12 each, Rfl: 0   promethazine (PHENERGAN) 25 MG tablet, Take 25-50 mg by mouth every 8 (eight) hours as needed., Disp: , Rfl:    topiramate (TOPAMAX) 50 MG tablet, TAKE 1 TABLET BY MOUTH EVERY  EVENING, Disp: 30 tablet, Rfl: 5   triamcinolone cream (KENALOG) 0.1 %, Apply 1 Application topically 2 (two) times daily as needed (itching and rash)., Disp: 453.6 g, Rfl: 1   Vitamin D, Ergocalciferol, (DRISDOL) 1.25 MG (50000 UNIT) CAPS capsule, Take 1 capsule (50,000 Units total) by mouth every 7 (seven) days., Disp: 12 capsule, Rfl: 1   budesonide-formoterol (SYMBICORT) 160-4.5 MCG/ACT inhaler, Inhale 2 puffs into the lungs 2 (two) times daily. (Patient not taking: Reported on 04/27/2023), Disp: 10.2 g, Rfl: 12   FLUoxetine (PROZAC) 10 MG capsule, Take 1 capsule (10 mg total) by mouth daily with breakfast. (Patient not taking: Reported on 04/27/2023), Disp: 90 capsule, Rfl: 0   fluticasone (FLONASE) 50 MCG/ACT nasal spray, Place 1 spray into both nostrils daily. (Patient not taking: Reported on 04/27/2023), Disp: 16 g, Rfl: 5   fluticasone (FLOVENT HFA) 220 MCG/ACT inhaler, INHALE 2 PUFFS INTO THE LUNGS,  2 TIMES PER DAY. RINSE MOUTH AFTER INHALATIONS (Patient not taking: Reported on 04/27/2023), Disp: 12 g, Rfl: 11   Ipratropium-Albuterol (COMBIVENT RESPIMAT) 20-100 MCG/ACT AERS respimat, Inhale 1 puff into the lungs every 6 (six) hours as needed for wheezing. (Patient not taking: Reported on 04/27/2023), Disp: 4 g, Rfl: 6   tamsulosin (FLOMAX) 0.4 MG CAPS capsule, , Disp: , Rfl:    traMADol (ULTRAM) 50 MG tablet, ONE TAB PO BID PRN FOR PAIN AND HEADCAHES, Disp: 60 tablet, Rfl: 2   traZODone (DESYREL) 50 MG tablet, Take 0.5 tablets (25 mg total) by mouth at bedtime. (Patient not taking: Reported on 04/27/2023), Disp: 45 tablet, Rfl: 1   Family History  Problem Relation Age of Onset   Breast cancer Mother 69   COPD Father    Lung cancer Father 43   Dementia Father    Diabetes Sister    Diabetes Brother    Ovarian cancer Paternal Aunt    Colon cancer Maternal Grandfather 6   Liver disease Maternal Aunt        ?etiology   Lymphoma Maternal Grandmother 45   Lung cancer Other 22       maternal  great grandfather   Stomach cancer Other 60       maternal great grandmother     Social History   Tobacco Use   Smoking status: Never   Smokeless tobacco: Never  Vaping Use   Vaping status: Never Used  Substance Use Topics   Alcohol use: Not Currently    Alcohol/week: 0.0 standard drinks of alcohol   Drug use: No    Allergies as of 04/27/2023 - Review Complete 04/27/2023  Allergen Reaction Noted   Other Anaphylaxis 08/30/2013   Peanut butter flavoring agent (non-screening) Swelling 08/24/2014   Sertraline Other (See Comments) 11/26/2014   Codeine Nausea And Vomiting 12/10/2011   Paroxetine Other (See Comments) 09/28/2014   Paroxetine hcl Other (See Comments) 12/10/2011   Prednisone & diphenhydramine Rash 07/23/2021   Venlafaxine Other (See Comments) 09/28/2014   Zoloft [sertraline hcl] Other (See Comments) 12/10/2011    Review of Systems:    All systems reviewed and negative except where noted in HPI.   Physical Exam:  BP (!) 156/78 (BP Location: Left Arm, Patient Position: Sitting, Cuff Size: Normal)   Pulse 80   Temp (!) 97.3 F (36.3 C) (Oral)   Ht 5\' 3"  (1.6 m)   Wt 208 lb 4 oz (94.5 kg)   LMP 12/06/2015   BMI 36.89 kg/m  Patient's last menstrual period was 12/06/2015.  General:   Alert,  Well-developed, well-nourished, pleasant and cooperative in NAD Head:  Normocephalic and atraumatic. Eyes:  Sclera clear, no icterus.   Conjunctiva pink. Ears:  Normal auditory acuity. Nose:  No deformity, discharge, or lesions. Mouth:  No deformity or lesions,oropharynx pink & moist. Neck:  Supple; no masses or thyromegaly. Lungs:  Respirations even and unlabored.  Clear throughout to auscultation.   No wheezes, crackles, or rhonchi. No acute distress. Heart:  Regular rate and rhythm; no murmurs, clicks, rubs, or gallops. Abdomen:  Normal bowel sounds. Soft, obese, left lower quadrant tenderness and non-distended without masses, hepatosplenomegaly or hernias noted.  No  guarding or rebound tenderness.   Rectal: Not performed Msk:  Symmetrical without gross deformities. Good, equal movement & strength bilaterally. Pulses:  Normal pulses noted. Extremities:  No clubbing or edema.  No cyanosis. Neurologic:  Alert and oriented x3;  grossly normal neurologically. Skin:  Intact without significant lesions  or rashes. No jaundice. Psych:  Alert and cooperative. Normal mood and affect.  Imaging Studies: Reviewed  Assessment and Plan:   Teresa Crawford is a 38 y.o. female with history of MR, obesity, chronic GERD, endometriosis, total hysterectomy is seen in consultation for 2 months history of difficulty swallowing despite being on omeprazole 40 mg p.o. twice daily  Recommend EGD with esophageal biopsies Continue omeprazole 40 mg twice daily before meals Discussed about antireflux lifestyle Advised patient to completely stop carbonated beverages  Follow up after above workup   [name], MD

## 2023-04-29 ENCOUNTER — Ambulatory Visit: Payer: Medicare Other | Admitting: Certified Registered Nurse Anesthetist

## 2023-04-29 ENCOUNTER — Ambulatory Visit
Admission: RE | Admit: 2023-04-29 | Discharge: 2023-04-29 | Disposition: A | Discharge status: Home / Self Care | Payer: Medicare Other | Source: Ambulatory Visit | Attending: Gastroenterology | Admitting: Gastroenterology

## 2023-04-29 ENCOUNTER — Encounter
Admission: RE | Disposition: A | Discharge status: Home / Self Care | Payer: Self-pay | Source: Ambulatory Visit | Attending: Gastroenterology

## 2023-04-29 ENCOUNTER — Other Ambulatory Visit: Payer: Self-pay

## 2023-04-29 ENCOUNTER — Telehealth: Payer: Self-pay | Admitting: Gastroenterology

## 2023-04-29 ENCOUNTER — Encounter: Payer: Self-pay | Admitting: Gastroenterology

## 2023-04-29 DIAGNOSIS — R1319 Other dysphagia: Secondary | ICD-10-CM | POA: Diagnosis not present

## 2023-04-29 DIAGNOSIS — R1314 Dysphagia, pharyngoesophageal phase: Secondary | ICD-10-CM | POA: Diagnosis present

## 2023-04-29 DIAGNOSIS — K2289 Other specified disease of esophagus: Secondary | ICD-10-CM

## 2023-04-29 DIAGNOSIS — Z7951 Long term (current) use of inhaled steroids: Secondary | ICD-10-CM | POA: Insufficient documentation

## 2023-04-29 DIAGNOSIS — K2970 Gastritis, unspecified, without bleeding: Secondary | ICD-10-CM

## 2023-04-29 DIAGNOSIS — K2960 Other gastritis without bleeding: Secondary | ICD-10-CM | POA: Diagnosis not present

## 2023-04-29 DIAGNOSIS — F419 Anxiety disorder, unspecified: Secondary | ICD-10-CM | POA: Insufficient documentation

## 2023-04-29 DIAGNOSIS — R569 Unspecified convulsions: Secondary | ICD-10-CM | POA: Diagnosis not present

## 2023-04-29 DIAGNOSIS — R110 Nausea: Secondary | ICD-10-CM | POA: Diagnosis not present

## 2023-04-29 DIAGNOSIS — F32A Depression, unspecified: Secondary | ICD-10-CM | POA: Diagnosis not present

## 2023-04-29 DIAGNOSIS — K2950 Unspecified chronic gastritis without bleeding: Secondary | ICD-10-CM | POA: Diagnosis not present

## 2023-04-29 HISTORY — PX: ESOPHAGOGASTRODUODENOSCOPY (EGD) WITH PROPOFOL: SHX5813

## 2023-04-29 HISTORY — PX: BIOPSY: SHX5522

## 2023-04-29 SURGERY — ESOPHAGOGASTRODUODENOSCOPY (EGD) WITH PROPOFOL
Anesthesia: General

## 2023-04-29 MED ORDER — SUCRALFATE 1 GM/10ML PO SUSP: 1.0000 g | Freq: Four times a day (QID) | ORAL | 0 refills | Status: DC

## 2023-04-29 MED ORDER — LIDOCAINE HCL (CARDIAC) PF 100 MG/5ML IV SOSY
PREFILLED_SYRINGE | INTRAVENOUS | Status: DC | PRN
  Administered 2023-04-29: 100 mg via INTRAVENOUS

## 2023-04-29 MED ORDER — PROPOFOL 1000 MG/100ML IV EMUL
INTRAVENOUS | Status: AC
  Filled 2023-04-29: qty 100

## 2023-04-29 MED ORDER — GLYCOPYRROLATE 0.2 MG/ML IJ SOLN
INTRAMUSCULAR | Status: DC | PRN
  Administered 2023-04-29: .2 mg via INTRAVENOUS

## 2023-04-29 MED ORDER — MIDAZOLAM HCL 2 MG/2ML IJ SOLN
INTRAMUSCULAR | Status: DC | PRN
  Administered 2023-04-29: 2 mg via INTRAVENOUS

## 2023-04-29 MED ORDER — SODIUM CHLORIDE 0.9 % IV SOLN: INTRAVENOUS | Status: DC

## 2023-04-29 MED ORDER — MIDAZOLAM HCL 2 MG/2ML IJ SOLN
INTRAMUSCULAR | Status: AC
  Filled 2023-04-29: qty 2

## 2023-04-29 MED ORDER — PROPOFOL 10 MG/ML IV BOLUS
INTRAVENOUS | Status: AC
  Filled 2023-04-29: qty 20

## 2023-04-29 MED ORDER — PROPOFOL 10 MG/ML IV BOLUS
INTRAVENOUS | Status: DC | PRN
  Administered 2023-04-29: 80 mg via INTRAVENOUS

## 2023-04-29 MED ORDER — PROPOFOL 500 MG/50ML IV EMUL
INTRAVENOUS | Status: DC | PRN
  Administered 2023-04-29: 170 ug/kg/min via INTRAVENOUS

## 2023-04-29 NOTE — Anesthesia Postprocedure Evaluation (Signed)
Anesthesia Post Note  Patient: Teresa Crawford  Procedure(s) Performed: ESOPHAGOGASTRODUODENOSCOPY (EGD) WITH PROPOFOL BIOPSY  Patient location during evaluation: Endoscopy Anesthesia Type: General Level of consciousness: awake and alert Pain management: pain level controlled Vital Signs Assessment: post-procedure vital signs reviewed and stable Respiratory status: spontaneous breathing, nonlabored ventilation, respiratory function stable and patient connected to nasal cannula oxygen Cardiovascular status: blood pressure returned to baseline and stable Postop Assessment: no apparent nausea or vomiting Anesthetic complications: no   No notable events documented.   Last Vitals:  Vitals:   04/29/23 1038 04/29/23 1040  BP: 127/85   Pulse: 87 92  Resp: 17 17  Temp:    SpO2: 100% 100%    Last Pain:  Vitals:   04/29/23 1023  TempSrc: Temporal  PainSc:                  Lenard Simmer

## 2023-04-29 NOTE — Transfer of Care (Signed)
Immediate Anesthesia Transfer of Care Note  Patient: Teresa Crawford  Procedure(s) Performed: ESOPHAGOGASTRODUODENOSCOPY (EGD) WITH PROPOFOL BIOPSY  Patient Location: Endoscopy Unit  Anesthesia Type:General  Level of Consciousness: drowsy  Airway & Oxygen Therapy: Patient Spontanous Breathing  Post-op Assessment: Report given to RN and Post -op Vital signs reviewed and stable  Post vital signs: Reviewed and stable  Last Vitals:  Vitals Value Taken Time  BP 112/82   Temp    Pulse 115 04/29/23 1014  Resp 23 04/29/23 1014  SpO2 96 % 04/29/23 1014    Last Pain:  Vitals:   04/29/23 1010  TempSrc: Temporal  PainSc:          Complications: No notable events documented.

## 2023-04-29 NOTE — Op Note (Signed)
[facility] Gastroenterology Patient Name: Teresa Crawford Procedure Date: 04/29/2023 9:46 AM MRN: [id number] Account #: [id number] Date of Birth: 1985/09/01 Admit Type: Outpatient Age: 38 Room: [facility] ENDO ROOM 2 Gender: Female Note Status: Finalized Instrument Name: Upper Endoscope [id number] Procedure:             Upper GI endoscopy Indications:           Esophageal dysphagia, Nausea Providers:             Toney Reil MD, MD Referring MD:          Sallyanne Kuster (Referring MD) Medicines:             General Anesthesia Complications:         No immediate complications. Estimated blood loss: None. Procedure:             Pre-Anesthesia Assessment:                        - Prior to the procedure, a History and Physical was                         performed, and patient medications and allergies were                         reviewed. The patient is competent. The risks and                         benefits of the procedure and the sedation options and                         risks were discussed with the patient. All questions                         were answered and informed consent was obtained.                         Patient identification and proposed procedure were                         verified by the physician, the nurse, the                         anesthesiologist, the anesthetist and the technician                         in the pre-procedure area in the procedure room in the                         endoscopy suite. Mental Status Examination: alert and                         oriented. Airway Examination: normal oropharyngeal                         airway and neck mobility. Respiratory Examination:                         clear to auscultation. CV Examination: normal.  Prophylactic Antibiotics: The patient does not require                         prophylactic antibiotics. Prior Anticoagulants: The                          patient has taken no anticoagulant or antiplatelet                         agents. ASA Grade Assessment: II - A patient with mild                         systemic disease. After reviewing the risks and                         benefits, the patient was deemed in satisfactory                         condition to undergo the procedure. The anesthesia                         plan was to use general anesthesia. Immediately prior                         to administration of medications, the patient was                         re-assessed for adequacy to receive sedatives. The                         heart rate, respiratory rate, oxygen saturations,                         blood pressure, adequacy of pulmonary ventilation, and                         response to care were monitored throughout the                         procedure. The physical status of the patient was                         re-assessed after the procedure.                        After obtaining informed consent, the endoscope was                         passed under direct vision. Throughout the procedure,                         the patient's blood pressure, pulse, and oxygen                         saturations were monitored continuously. The                         Endosonoscope was introduced through the mouth, and  advanced to the second part of duodenum. The upper GI                         endoscopy was accomplished without difficulty. The                         patient tolerated the procedure well. Findings:      The duodenal bulb and second portion of the duodenum were normal.      The entire examined stomach was normal. Biopsies were taken with a cold       forceps for histology.      The cardia and gastric fundus were normal on retroflexion.      Esophagogastric landmarks were identified: the gastroesophageal junction       was found at 35 cm from the incisors.      The gastroesophageal  junction and examined esophagus were normal.       Biopsies were taken with a cold forceps for histology. Impression:            - Normal duodenal bulb and second portion of the                         duodenum.                        - Normal stomach. Biopsied.                        - Esophagogastric landmarks identified.                        - Normal gastroesophageal junction and esophagus.                         Biopsied. Recommendation:        - Await pathology results.                        - Discharge patient to home (with escort).                        - Resume previous diet today.                        - Continue present medications. Procedure Code(s):     --- Professional ---                        [id number], Esophagogastroduodenoscopy, flexible,                         transoral; with biopsy, single or multiple Diagnosis Code(s):     --- Professional ---                        R13.14, Dysphagia, pharyngoesophageal phase                        R11.0, Nausea CPT copyright 2022 [employer]. All rights reserved. The codes documented in this report are preliminary and upon coder review may  be revised to meet current compliance requirements. Dr. Libby Maw Toney Reil MD, MD 04/29/2023 10:10:27 AM This  report has been signed electronically. Number of Addenda: 0 Note Initiated On: 04/29/2023 9:46 AM Estimated Blood Loss:  Estimated blood loss: none.      [facility]

## 2023-04-29 NOTE — H&P (Signed)
[name], MD [address]  [address]  [place], [place] [address]  Main: [phone]  Fax: [phone] Pager: [phone]  Primary Care Physician:  Sallyanne Kuster, NP Primary Gastroenterologist:  Dr. [name]  Pre-Procedure History & Physical: HPI:  Teresa Crawford is a 38 y.o. female is here for an endoscopy.   Past Medical History:  Diagnosis Date   B12 deficiency anemia    Chronic back pain    Chronic fatigue syndrome    Constipation    Depression    Elevated liver enzymes    Endometriosis    Fibroids    Hepatic steatosis    Hypothyroid    Last menstrual period (LMP) > 10 days ago 09/12/15   Mental retardation    Migraine    Post traumatic stress disorder    Posttraumatic stress disorder    PUD (peptic ulcer disease)    Scoliosis    Splenomegaly    Uterine fibroid     Past Surgical History:  Procedure Laterality Date   ABDOMINAL HYSTERECTOMY     BACK SURGERY     COLONOSCOPY  04/27/2006   Wohl-colitis, internal hemorrhoids   COLONOSCOPY  02/2013   focal right colitis, left colon biopsy negative, focal active proctitis without chronicity   COLONOSCOPY WITH PROPOFOL N/A 07/16/2022   Procedure: COLONOSCOPY WITH PROPOFOL;  Surgeon: Toney Reil, MD;  Location: [facility] ENDOSCOPY;  Service: Gastroenterology;  Laterality: N/A;   ESOPHAGOGASTRODUODENOSCOPY  02/2013   multiple peptic & duodenal ulcers, negative h pylori   INTRAUTERINE DEVICE INSERTION  2012   LAPAROSCOPIC ENDOMETRIOSIS FULGURATION  2015   [name]   OVARIAN CYST REMOVAL      Prior to Admission medications   Medication Sig Start Date End Date Taking? Authorizing Provider  buPROPion (WELLBUTRIN XL) 150 MG 24 hr tablet Take 1 tablet (150 mg total) by mouth in the morning. Stop Bupropion 75 mg 04/15/23  Yes Eappen, Levin Bacon, MD  cyanocobalamin (VITAMIN B12) 1000 MCG tablet Take 1 tablet (1,000 mcg total) by mouth daily. 01/07/23  Yes Abernathy, [name], NP  omeprazole (PRILOSEC) 40 MG  capsule TAKE 1 CAPSULE BY MOUTH TWICE A DAY 12/08/22  Yes Abernathy, Alyssa, NP  ondansetron (ZOFRAN-ODT) 4 MG disintegrating tablet Take 1 tablet (4 mg total) by mouth every 8 (eight) hours as needed for nausea or vomiting. 11/02/22  Yes Lyndon Code, MD  promethazine (PHENERGAN) 25 MG tablet Take 25-50 mg by mouth every 8 (eight) hours as needed. 11/09/22  Yes [provider]  topiramate (TOPAMAX) 50 MG tablet TAKE 1 TABLET BY MOUTH EVERY EVENING 12/08/22  Yes Abernathy, Alyssa, NP  traMADol (ULTRAM) 50 MG tablet ONE TAB PO BID PRN FOR PAIN AND HEADCAHES 11/25/22  Yes Abernathy, Alyssa, NP  albuterol (VENTOLIN HFA) 108 (90 Base) MCG/ACT inhaler Inhale 2 puffs into the lungs every 6 (six) hours as needed for wheezing or shortness of breath. Patient not taking: Reported on 04/29/2023 01/07/23   Sallyanne Kuster, NP  benzonatate (TESSALON) 200 MG capsule Take 200 mg by mouth 2 (two) times daily as needed for cough.    [provider]  cetirizine (ZYRTEC ALLERGY) 10 MG tablet Take 1 tablet (10 mg total) by mouth daily. 01/07/23   Sallyanne Kuster, NP  diphenhydrAMINE (BENADRYL) 25 mg capsule Take 25 mg by mouth every 6 (six) hours as needed for allergies.    [provider]  docusate sodium (COLACE) 100 MG capsule TAKE 1 CAPSULE BY MOUTH ONCE DAILY FOR STOOL  SOFTENER 01/07/23   Abernathy, [name], NP  EPINEPHrine 0.3 mg/0.3 mL IJ SOAJ injection USE FOR ANAPHYLAXIS 01/07/23   Sallyanne Kuster, NP  methocarbamol (ROBAXIN) 500 MG tablet Take 1 tablet by mouth as needed for back spasms per Ortho.    [provider]  promethazine (PHENERGAN) 25 MG suppository Place 1 suppository (25 mg total) rectally every 6 (six) hours as needed for nausea or vomiting. 12/09/22   Sallyanne Kuster, NP  triamcinolone cream (KENALOG) 0.1 % Apply 1 Application topically 2 (two) times daily as needed (itching and rash). 01/07/23   Sallyanne Kuster, NP  Vitamin D, Ergocalciferol, (DRISDOL) 1.25 MG (50000  UNIT) CAPS capsule Take 1 capsule (50,000 Units total) by mouth every 7 (seven) days. 01/07/23   Sallyanne Kuster, NP    Allergies as of 03/24/2023 - Review Complete 03/24/2023  Allergen Reaction Noted   Other Anaphylaxis 08/30/2013   Peanut butter flavoring agent (non-screening) Swelling 08/24/2014   Sertraline Other (See Comments) 11/26/2014   Codeine Nausea And Vomiting 12/10/2011   Paroxetine Other (See Comments) 09/28/2014   Paroxetine hcl Other (See Comments) 12/10/2011   Prednisone & diphenhydramine Rash 07/23/2021   Venlafaxine Other (See Comments) 09/28/2014   Zoloft [sertraline hcl] Other (See Comments) 12/10/2011    Family History  Problem Relation Age of Onset   Breast cancer Mother 33   COPD Father    Lung cancer Father 17   Dementia Father    Diabetes Sister    Diabetes Brother    Ovarian cancer Paternal Aunt    Colon cancer Maternal Grandfather 27   Liver disease Maternal Aunt        ?etiology   Lymphoma Maternal Grandmother 55   Lung cancer Other 45       maternal great grandfather   Stomach cancer Other 52       maternal great grandmother    Social History   Socioeconomic History   Marital status: Single    Spouse name: Not on file   Number of children: 0   Years of education: Not on file   Highest education level: Not on file  Occupational History   Not on file  Tobacco Use   Smoking status: Never   Smokeless tobacco: Never  Vaping Use   Vaping status: Never Used  Substance and Sexual Activity   Alcohol use: Not Currently    Alcohol/week: 0.0 standard drinks of alcohol   Drug use: No   Sexual activity: Not Currently  Other Topics Concern   Not on file  Social History Narrative   She has brother, half brother & sister live nearby   Both parents deceased   Care managed by Anselm Pancoast   Social Drivers of Health   Financial Resource Strain: Low Risk  (02/11/2017)   Overall Financial Resource Strain (CARDIA)    Difficulty of Paying Living  Expenses: Not hard at all  Food Insecurity: No Food Insecurity (02/11/2017)   Hunger Vital Sign    Worried About Running Out of Food in the Last Year: Never true    Ran Out of Food in the Last Year: Never true  Transportation Needs: No Transportation Needs (02/11/2017)   PRAPARE - Administrator, Civil Service (Medical): No    Lack of Transportation (Non-Medical): No  Physical Activity: Inactive (02/11/2017)   Exercise Vital Sign    Days of Exercise per Week: 0 days    Minutes of Exercise per Session: 0 min  Stress: No Stress Concern Present (  02/11/2017)   [employer] of Occupational Health - Occupational Stress Questionnaire    Feeling of Stress : Not at all  Social Connections: Unknown (02/11/2017)   Social Connection and Isolation Panel [NHANES]    Frequency of Communication with Friends and Family: Patient declined    Frequency of Social Gatherings with Friends and Family: Patient declined    Attends Religious Services: Patient declined    Database administrator or Organizations: Patient declined    Attends Banker Meetings: Patient declined    Marital Status: Patient declined  Intimate Partner Violence: Unknown (02/11/2017)   Humiliation, Afraid, Rape, and Kick questionnaire    Fear of Current or Ex-Partner: Patient declined    Emotionally Abused: Patient declined    Physically Abused: Patient declined    Sexually Abused: Patient declined    Review of Systems: See HPI, otherwise negative ROS  Physical Exam: BP 123/79   Pulse 80   Temp (!) 97 F (36.1 C) (Temporal)   Resp 17   Ht 5\' 3"  (1.6 m)   Wt 94.3 kg   LMP 12/06/2015   SpO2 100%   BMI 36.85 kg/m  General:   Alert,  pleasant and cooperative in NAD Head:  Normocephalic and atraumatic. Neck:  Supple; no masses or thyromegaly. Lungs:  Clear throughout to auscultation.    Heart:  Regular rate and rhythm. Abdomen:  Soft, nontender and nondistended. Normal bowel sounds, without guarding,  and without rebound.   Neurologic:  Alert and  oriented x4;  grossly normal neurologically.  Impression/Plan: KAMY ANTRIM is here for an endoscopy to be performed for dysphagia  Risks, benefits, limitations, and alternatives regarding upper endoscopy have been reviewed with the patient.  Questions have been answered.  All parties agreeable.   Lannette Donath, MD  04/29/2023, 9:11 AM

## 2023-04-29 NOTE — Telephone Encounter (Signed)
The patient called in and left a voicemail requesting to speak to the nurse because she is throwing up blood. I called her back to let her know we received her message, and I sent the message to the nurse.

## 2023-04-29 NOTE — Addendum Note (Signed)
Addended by: Radene Knee L on: 04/29/2023 04:32 PM   Modules accepted: Orders

## 2023-04-29 NOTE — Telephone Encounter (Signed)
Patient states she is throwing up bright red blood 2 times. She states she is having epigastric pain 9/10 on the pain. The pain is a aching pain and sharp pain. Per Dr. Allegra Lai we need to call in Carafate suspension and make sure she is taking omeprazole BID. She can also take Zofran or Phenergan. If she is still throwing up after taking this then she needs to go to the ER. Informed patient and she said send the medication to [facility] and verbalized understanding of instructions

## 2023-04-29 NOTE — Anesthesia Preprocedure Evaluation (Signed)
Anesthesia Evaluation  Patient identified by MRN, date of birth, ID band Patient awake  General Assessment Comment:Patient AO x 3, understands the nature of the procedure. I deem her to have capacity to consent for the anesthetic.  Reviewed: Allergy & Precautions, NPO status , Patient's Chart, lab work & pertinent test results  History of Anesthesia Complications Negative for: history of anesthetic complications  Airway Mallampati: II  TM Distance: >3 FB Neck ROM: Full    Dental  (+) Upper Dentures, Lower Dentures Firmly bonded in place:   Pulmonary neg shortness of breath, asthma , neg sleep apnea, neg COPD, Recent URI , Resolved, Patient abstained from smoking.Not current smoker Takes flovent daily, not taken today. Breathing feels at baseline   Pulmonary exam normal breath sounds clear to auscultation       Cardiovascular Exercise Tolerance: Good METS(-) hypertension(-) angina (-) CAD and (-) Past MI negative cardio ROS (-) dysrhythmias  Rhythm:Regular Rate:Normal - Systolic murmurs    Neuro/Psych  Headaches, Seizures -, Well Controlled,  PSYCHIATRIC DISORDERS Anxiety Depression       GI/Hepatic PUD,neg GERD  ,,(+)     (-) substance abuse    Endo/Other  neg diabetesHypothyroidism    Renal/GU negative Renal ROS     Musculoskeletal   Abdominal   Peds  Hematology   Anesthesia Other Findings Past Medical History: No date: B12 deficiency anemia No date: Chronic back pain No date: Chronic fatigue syndrome No date: Constipation No date: Depression No date: Elevated liver enzymes No date: Endometriosis No date: Fibroids No date: Hepatic steatosis No date: Hypothyroid 09/12/15: Last menstrual period (LMP) > 10 days ago No date: Mental retardation No date: Migraine No date: Post traumatic stress disorder No date: Posttraumatic stress disorder No date: PUD (peptic ulcer disease) No date: Scoliosis No date:  Splenomegaly No date: Uterine fibroid  Reproductive/Obstetrics                             Anesthesia Physical Anesthesia Plan  ASA: 2  Anesthesia Plan: General   Post-op Pain Management: Minimal or no pain anticipated   Induction: Intravenous  PONV Risk Score and Plan: 3 and Propofol infusion and TIVA  Airway Management Planned: Nasal Cannula and Natural Airway  Additional Equipment: None  Intra-op Plan:   Post-operative Plan:   Informed Consent: I have reviewed the patients History and Physical, chart, labs and discussed the procedure including the risks, benefits and alternatives for the proposed anesthesia with the patient or authorized representative who has indicated his/her understanding and acceptance.     Dental advisory given  Plan Discussed with: CRNA and Surgeon  Anesthesia Plan Comments: (Discussed risks of anesthesia with patient, including possibility of difficulty with spontaneous ventilation under anesthesia necessitating airway intervention, PONV, and rare risks such as cardiac or respiratory or neurological events, and allergic reactions. Discussed the role of CRNA in patient's perioperative care. Patient understands.)        Anesthesia Quick Evaluation

## 2023-04-30 LAB — SURGICAL PATHOLOGY

## 2023-05-03 ENCOUNTER — Other Ambulatory Visit
Admission: RE | Admit: 2023-05-03 | Discharge: 2023-05-03 | Disposition: A | Discharge status: Home / Self Care | Payer: Medicare Other | Source: Home / Self Care | Attending: Gastroenterology | Admitting: Gastroenterology

## 2023-05-03 ENCOUNTER — Telehealth: Payer: Self-pay

## 2023-05-03 ENCOUNTER — Ambulatory Visit
Admission: RE | Admit: 2023-05-03 | Discharge: 2023-05-03 | Disposition: A | Discharge status: Home / Self Care | Payer: Medicare Other | Source: Ambulatory Visit | Attending: Gastroenterology | Admitting: Gastroenterology

## 2023-05-03 ENCOUNTER — Ambulatory Visit
Admission: RE | Admit: 2023-05-03 | Discharge: 2023-05-03 | Disposition: A | Discharge status: Home / Self Care | Payer: Medicare Other | Attending: Gastroenterology | Admitting: Gastroenterology

## 2023-05-03 DIAGNOSIS — K920 Hematemesis: Secondary | ICD-10-CM | POA: Diagnosis not present

## 2023-05-03 DIAGNOSIS — R1013 Epigastric pain: Secondary | ICD-10-CM

## 2023-05-03 DIAGNOSIS — R109 Unspecified abdominal pain: Secondary | ICD-10-CM | POA: Diagnosis not present

## 2023-05-03 LAB — CBC WITH DIFFERENTIAL/PLATELET
Abs Immature Granulocytes: 0.02 10*3/uL (ref 0.00–0.07)
Basophils Absolute: 0 10*3/uL (ref 0.0–0.1)
Basophils Relative: 0 %
Eosinophils Absolute: 0.1 10*3/uL (ref 0.0–0.5)
Eosinophils Relative: 1 %
HCT: 38.5 % (ref 36.0–46.0)
Hemoglobin: 13.1 g/dL (ref 12.0–15.0)
Immature Granulocytes: 0 %
Lymphocytes Relative: 26 %
Lymphs Abs: 1.5 10*3/uL (ref 0.7–4.0)
MCH: 28 pg (ref 26.0–34.0)
MCHC: 34 g/dL (ref 30.0–36.0)
MCV: 82.3 fL (ref 80.0–100.0)
Monocytes Absolute: 0.4 10*3/uL (ref 0.1–1.0)
Monocytes Relative: 6 %
Neutro Abs: 3.8 10*3/uL (ref 1.7–7.7)
Neutrophils Relative %: 67 %
Platelets: 184 10*3/uL (ref 150–400)
RBC: 4.68 MIL/uL (ref 3.87–5.11)
RDW: 13.5 % (ref 11.5–15.5)
WBC: 5.8 10*3/uL (ref 4.0–10.5)
nRBC: 0 % (ref 0.0–0.2)

## 2023-05-03 NOTE — Addendum Note (Signed)
Addended by: Radene Knee L on: 05/03/2023 10:41 AM   Modules accepted: Orders

## 2023-05-03 NOTE — Telephone Encounter (Signed)
Patient states she is still vomiting with blood in it several times a day. She states she is really nausea and has abdominal pain in her epigastric area that is severe. She has been drinking water and trying to eat chicken broth. She feels really bloated and full. She states she has taken the Carafate but it has not helped. Informed patient since she was vomiting blood still and having severe abdominal pain she needed to go to the ER. She states she will go

## 2023-05-03 NOTE — Telephone Encounter (Signed)
Order the labs and Xray as STAT. Called the patient and and patient verbalized understanding of instructions

## 2023-05-03 NOTE — Telephone Encounter (Signed)
Pt stated she had an EGD with Dr. Allegra Lai last week and was given some medication. She was advised to call back if this did not work. She is calling back letting Dr. Allegra Lai know she is not improving. Please advise.

## 2023-05-04 ENCOUNTER — Telehealth: Payer: Self-pay

## 2023-05-04 ENCOUNTER — Encounter: Payer: Self-pay | Admitting: Gastroenterology

## 2023-05-04 NOTE — Telephone Encounter (Signed)
-----   Message from [facility] sent at 05/04/2023 12:30 PM EST ----- Please inform patient that her hemoglobin is normal and abdominal x-ray is also normal.  I expect her symptoms to get better.  If not, recommend to check serum lipase, LFTs and HIDA scan  Rohini Vanga

## 2023-05-04 NOTE — Telephone Encounter (Signed)
Patient verbalized understanding of results. She states her symptoms are no better today are the same as yesterday. Informed her to give it a another day or so and let me know and we would order further testing if it has not improved

## 2023-05-05 ENCOUNTER — Telehealth: Payer: Self-pay

## 2023-05-05 DIAGNOSIS — K219 Gastro-esophageal reflux disease without esophagitis: Secondary | ICD-10-CM | POA: Diagnosis not present

## 2023-05-05 DIAGNOSIS — R1013 Epigastric pain: Secondary | ICD-10-CM

## 2023-05-05 DIAGNOSIS — K920 Hematemesis: Secondary | ICD-10-CM | POA: Diagnosis not present

## 2023-05-05 DIAGNOSIS — R1011 Right upper quadrant pain: Secondary | ICD-10-CM

## 2023-05-05 NOTE — Telephone Encounter (Signed)
Lanora Manis called back and left a message on the main line and states to call her back at [phone]. Called elizabeth back and informed her of everything that the patient has been experiencing since the EGD. Informed her what we have order and what was pending. She verbalized understanding

## 2023-05-05 NOTE — Telephone Encounter (Signed)
Patient verablized understanding of instructions. She states she will have to reschedule the Hida scan because she already has a appointment that day. Gave her the number to reschedule the appointment and she states she will call them

## 2023-05-05 NOTE — Telephone Encounter (Signed)
Patient called back and asked [place] to call her case manager Lanora Manis and her number is [phone]. Called and left a message for call back

## 2023-05-05 NOTE — Telephone Encounter (Signed)
Patient left  a message and states her abdominal pain is getting worse and would like a call back. Per Results yesterday  ----- Message from Lannette Donath sent at 05/04/2023 12:30 PM EST ----- Please inform patient that her hemoglobin is normal and abdominal x-ray is also normal.  I expect her symptoms to get better.  If not, recommend to check serum lipase, LFTs and HIDA scan   Rohini Vanga      Order the lab and the Hida scan. Called and got patient schedule for the Hida scan. They have her scheduled 05/11/2023 arrive 12:00pm at [facility] for a 12:30pm scan. Nothing to eat or drink 6 hours prior.   Called patient back and left a message for call back

## 2023-05-06 ENCOUNTER — Telehealth: Payer: Self-pay

## 2023-05-06 ENCOUNTER — Telehealth: Payer: Self-pay | Admitting: Gastroenterology

## 2023-05-06 LAB — HEPATIC FUNCTION PANEL
ALT: 14 [IU]/L (ref 0–32)
AST: 13 [IU]/L (ref 0–40)
Albumin: 4.1 g/dL (ref 3.9–4.9)
Alkaline Phosphatase: 112 [IU]/L (ref 44–121)
Bilirubin Total: 0.5 mg/dL (ref 0.0–1.2)
Bilirubin, Direct: 0.12 mg/dL (ref 0.00–0.40)
Total Protein: 7.2 g/dL (ref 6.0–8.5)

## 2023-05-06 LAB — LIPASE: Lipase: 13 U/L — ABNORMAL LOW (ref 14–72)

## 2023-05-06 NOTE — Telephone Encounter (Signed)
Patient verbalized understanding of results

## 2023-05-06 NOTE — Telephone Encounter (Signed)
Called and left a message for call back

## 2023-05-06 NOTE — Telephone Encounter (Signed)
Documented in other open telephone call

## 2023-05-06 NOTE — Telephone Encounter (Signed)
-----   Message from [facility] sent at 05/06/2023 11:25 AM EST ----- Normal labs  RV

## 2023-05-06 NOTE — Telephone Encounter (Signed)
PT requesting call back returning call

## 2023-05-06 NOTE — Telephone Encounter (Signed)
View All Conversations on this [place], [place] routed conversation to [employer] minutes ago (2:32 PM)   [place], [place] minutes ago (2:32 PM)   LR PT requesting call back returning call      Note   Maggie, Senseney [phone]  Renee Pain

## 2023-05-07 ENCOUNTER — Other Ambulatory Visit: Payer: Self-pay

## 2023-05-07 ENCOUNTER — Emergency Department: Payer: Medicare Other

## 2023-05-07 ENCOUNTER — Emergency Department
Admission: EM | Admit: 2023-05-07 | Discharge: 2023-05-07 | Disposition: A | Discharge status: Home / Self Care | Payer: Medicare Other | Attending: Emergency Medicine | Admitting: Emergency Medicine

## 2023-05-07 ENCOUNTER — Telehealth: Payer: Self-pay

## 2023-05-07 DIAGNOSIS — N200 Calculus of kidney: Secondary | ICD-10-CM | POA: Diagnosis not present

## 2023-05-07 DIAGNOSIS — Z20822 Contact with and (suspected) exposure to covid-19: Secondary | ICD-10-CM | POA: Insufficient documentation

## 2023-05-07 DIAGNOSIS — N8311 Corpus luteum cyst of right ovary: Secondary | ICD-10-CM | POA: Insufficient documentation

## 2023-05-07 DIAGNOSIS — R0602 Shortness of breath: Secondary | ICD-10-CM | POA: Insufficient documentation

## 2023-05-07 DIAGNOSIS — J8410 Pulmonary fibrosis, unspecified: Secondary | ICD-10-CM | POA: Diagnosis not present

## 2023-05-07 DIAGNOSIS — R109 Unspecified abdominal pain: Secondary | ICD-10-CM | POA: Diagnosis not present

## 2023-05-07 DIAGNOSIS — R112 Nausea with vomiting, unspecified: Secondary | ICD-10-CM | POA: Diagnosis not present

## 2023-05-07 DIAGNOSIS — K760 Fatty (change of) liver, not elsewhere classified: Secondary | ICD-10-CM | POA: Diagnosis not present

## 2023-05-07 DIAGNOSIS — R1013 Epigastric pain: Secondary | ICD-10-CM | POA: Diagnosis not present

## 2023-05-07 DIAGNOSIS — R079 Chest pain, unspecified: Secondary | ICD-10-CM | POA: Diagnosis not present

## 2023-05-07 DIAGNOSIS — R059 Cough, unspecified: Secondary | ICD-10-CM | POA: Diagnosis not present

## 2023-05-07 DIAGNOSIS — R1011 Right upper quadrant pain: Secondary | ICD-10-CM | POA: Diagnosis not present

## 2023-05-07 DIAGNOSIS — R1110 Vomiting, unspecified: Secondary | ICD-10-CM | POA: Diagnosis not present

## 2023-05-07 LAB — CBC WITH DIFFERENTIAL/PLATELET
Abs Immature Granulocytes: 0.03 10*3/uL (ref 0.00–0.07)
Basophils Absolute: 0 10*3/uL (ref 0.0–0.1)
Basophils Relative: 0 %
Eosinophils Absolute: 0 10*3/uL (ref 0.0–0.5)
Eosinophils Relative: 0 %
HCT: 39.2 % (ref 36.0–46.0)
Hemoglobin: 13.3 g/dL (ref 12.0–15.0)
Immature Granulocytes: 0 %
Lymphocytes Relative: 13 %
Lymphs Abs: 1.2 10*3/uL (ref 0.7–4.0)
MCH: 28.3 pg (ref 26.0–34.0)
MCHC: 33.9 g/dL (ref 30.0–36.0)
MCV: 83.4 fL (ref 80.0–100.0)
Monocytes Absolute: 0.4 10*3/uL (ref 0.1–1.0)
Monocytes Relative: 4 %
Neutro Abs: 8.1 10*3/uL — ABNORMAL HIGH (ref 1.7–7.7)
Neutrophils Relative %: 83 %
Platelets: 172 10*3/uL (ref 150–400)
RBC: 4.7 MIL/uL (ref 3.87–5.11)
RDW: 13.5 % (ref 11.5–15.5)
WBC: 9.8 10*3/uL (ref 4.0–10.5)
nRBC: 0 % (ref 0.0–0.2)

## 2023-05-07 LAB — COMPREHENSIVE METABOLIC PANEL
ALT: 15 U/L (ref 0–44)
AST: 12 U/L — ABNORMAL LOW (ref 15–41)
Albumin: 3.8 g/dL (ref 3.5–5.0)
Alkaline Phosphatase: 79 U/L (ref 38–126)
Anion gap: 11 (ref 5–15)
BUN: 6 mg/dL (ref 6–20)
CO2: 24 mmol/L (ref 22–32)
Calcium: 8.9 mg/dL (ref 8.9–10.3)
Chloride: 103 mmol/L (ref 98–111)
Creatinine, Ser: 0.69 mg/dL (ref 0.44–1.00)
GFR, Estimated: >60 mL/min (ref >60)
Glucose, Bld: 91 mg/dL (ref 70–99)
Potassium: 3.9 mmol/L (ref 3.5–5.1)
Sodium: 138 mmol/L (ref 135–145)
Total Bilirubin: 1 mg/dL (ref 0.0–1.2)
Total Protein: 7.7 g/dL (ref 6.5–8.1)

## 2023-05-07 LAB — TSH: TSH: 2.78 u[IU]/mL (ref 0.350–4.500)

## 2023-05-07 LAB — LIPASE, BLOOD: Lipase: 23 U/L (ref 11–51)

## 2023-05-07 LAB — URINALYSIS, ROUTINE W REFLEX MICROSCOPIC
Bilirubin Urine: NEGATIVE
Glucose, UA: NEGATIVE mg/dL
Hgb urine dipstick: NEGATIVE
Ketones, ur: 5 mg/dL — AB
Leukocytes,Ua: NEGATIVE
Nitrite: NEGATIVE
Protein, ur: NEGATIVE mg/dL
Specific Gravity, Urine: 1.026 (ref 1.005–1.030)
pH: 5 (ref 5.0–8.0)

## 2023-05-07 LAB — RESP PANEL BY RT-PCR (RSV, FLU A&B, COVID)  RVPGX2
Influenza A by PCR: NEGATIVE
Influenza B by PCR: NEGATIVE
Resp Syncytial Virus by PCR: NEGATIVE
SARS Coronavirus 2 by RT PCR: NEGATIVE

## 2023-05-07 LAB — HCG, QUANTITATIVE, PREGNANCY: hCG, Beta Chain, Quant, S: 1 m[IU]/mL (ref <5)

## 2023-05-07 MED ORDER — KETOROLAC TROMETHAMINE 15 MG/ML IJ SOLN
15.0000 mg | Freq: Once | INTRAMUSCULAR | Status: AC
  Administered 2023-05-07: 15 mg via INTRAVENOUS
  Filled 2023-05-07: qty 1

## 2023-05-07 MED ORDER — IOHEXOL 300 MG/ML  SOLN
100.0000 mL | Freq: Once | INTRAMUSCULAR | Status: AC | PRN
  Administered 2023-05-07: 100 mL via INTRAVENOUS

## 2023-05-07 MED ORDER — ONDANSETRON HCL 4 MG/2ML IJ SOLN
4.0000 mg | Freq: Once | INTRAMUSCULAR | Status: AC
  Administered 2023-05-07: 4 mg via INTRAVENOUS
  Filled 2023-05-07: qty 2

## 2023-05-07 MED ORDER — SODIUM CHLORIDE 0.9 % IV BOLUS
1000.0000 mL | Freq: Once | INTRAVENOUS | Status: AC
Start: 2023-05-07 — End: 2023-05-07
  Administered 2023-05-07: 1000 mL via INTRAVENOUS

## 2023-05-07 MED ORDER — FAMOTIDINE IN NACL 20-0.9 MG/50ML-% IV SOLN
20.0000 mg | Freq: Once | INTRAVENOUS | Status: AC
  Administered 2023-05-07: 20 mg via INTRAVENOUS
  Filled 2023-05-07: qty 50

## 2023-05-07 NOTE — Telephone Encounter (Signed)
Patient called in this morning stating that late last night she began to vomit worse and she is vomiting yellow stuff. She states the epigastric pain is worse. She also stated she is having really bad chest pain and shortness of breath. Asked patient is she hurting her upper back or arms she said no. The pain is worse in her chest and epigastric area. Asked her is she drinking fluids and eating. She states she is drinking water and is only eating chicken broth and chicken noddle soup but does not eat much of it because when she eats the pain gets worse and she gets fuller faster. Informed her since she is stating she is having SOB and Chest pain she has to go to the ER to rule out anything cardiac. She states okay she will go but I needed to call Lanora Manis her coordinator to let her know also because she wanted to know what the doctor office said.   Linus Orn at [phone] and informed her of our recommendations and she asked [place] to fax the lab work to her at [phone]. Printed and fax this to [place].

## 2023-05-07 NOTE — Telephone Encounter (Signed)
The patient called wanting to speak to the nurse about some issues.

## 2023-05-07 NOTE — ED Provider Notes (Signed)
[facility] Provider Note    Event Date/Time   First MD Initiated Contact with Patient 05/07/23 1818     (approximate)   History   Emesis   HPI  Teresa Crawford is a 38 y.o. female presents to the emergency department with nausea and vomiting and not feeling well.  Patient states that she has been having intermittent episodes of nausea, vomiting and trouble with eating over the past couple of months but states that it is worsened over the past couple of days.  Recently had an endoscopy and states that she has had multiple episodes of vomiting since that time.  States that initially she had some vomiting of blood but has no longer had any vomiting of blood.  Difficulty keeping fluids down.  States that she has not been having any diarrhea.  Denies any blood in her stool.  Denies any fever or chills.  Complaining of some shortness of breath and a mild cough.  Mild chest pain that she describes as epigastric pain that radiates to the left upper abdomen and lower chest wall.  History of partial hysterectomy.  History of endometriosis.     Physical Exam   Triage Vital Signs: ED Triage Vitals  Encounter Vitals Group     BP 05/07/23 1316 128/86     Systolic BP Percentile --      Diastolic BP Percentile --      Pulse Rate 05/07/23 1316 100     Resp 05/07/23 1316 18     Temp 05/07/23 1316 98 F (36.7 C)     Temp Source 05/07/23 1316 Oral     SpO2 05/07/23 1316 97 %     Weight 05/07/23 1317 208 lb (94.3 kg)     Height 05/07/23 1317 5\' 3"  (1.6 m)     Head Circumference --      Peak Flow --      Pain Score 05/07/23 1316 10     Pain Loc --      Pain Education --      Exclude from Growth Chart --     Most recent vital signs: Vitals:   05/07/23 1753 05/07/23 1938  BP: 121/87 125/84  Pulse: 72 75  Resp: 16 16  Temp: 98.5 F (36.9 C) 98.5 F (36.9 C)  SpO2: 100% 99%    Physical Exam Constitutional:      Appearance: She is well-developed.  HENT:      Head: Atraumatic.  Eyes:     Conjunctiva/sclera: Conjunctivae normal.  Cardiovascular:     Rate and Rhythm: Regular rhythm.  Pulmonary:     Effort: No respiratory distress.  Abdominal:     General: There is no distension.     Tenderness: There is abdominal tenderness. There is no right CVA tenderness or left CVA tenderness.  Musculoskeletal:        General: Normal range of motion.     Cervical back: Normal range of motion.  Skin:    General: Skin is warm.     Capillary Refill: Capillary refill takes less than 2 seconds.  Neurological:     Mental Status: She is alert. Mental status is at baseline.      IMPRESSION / MDM / ASSESSMENT AND PLAN / ED COURSE  I reviewed the triage vital signs and the nursing notes.  Differential diagnosis including viral gastritis, viral illness including COVID/influenza, ACS, pancreatitis, symptomatic cholelithiasis, acute cholecystitis, pneumonia.  Low suspicion for pulmonary embolism, low risk Wells  criteria, PERC negative.  Do not feel that screening D-dimer or CTA is necessary at this time.   RADIOLOGY I independently reviewed imaging, my interpretation of imaging: Ultrasound right upper quadrant with no findings of acute cholecystitis.  Common bile duct was within normal limits.  CT scan abdomen and pelvis with no acute findings   Labs (all labs ordered are listed, but only abnormal results are displayed) Labs interpreted as -    Labs Reviewed  COMPREHENSIVE METABOLIC PANEL - Abnormal; Notable for the following components:      Result Value   AST 12 (*)    All other components within normal limits  CBC WITH DIFFERENTIAL/PLATELET - Abnormal; Notable for the following components:   Neutro Abs 8.1 (*)    All other components within normal limits  URINALYSIS, ROUTINE W REFLEX MICROSCOPIC - Abnormal; Notable for the following components:   Color, Urine AMBER (*)    APPearance HAZY (*)    Ketones, ur 5 (*)    All other components within  normal limits  RESP PANEL BY RT-PCR (RSV, FLU A&B, COVID)  RVPGX2  LIPASE, BLOOD  TSH  HCG, QUANTITATIVE, PREGNANCY    Patient given antiemetics and IV fluids.  Given Pepcid given her upper abdominal pain.  Low suspicion for ACS, troponin is negative and low risk heart score.  Low suspicion for pulmonary embolism given PERC negative and low risk Wells criteria.  Right upper quadrant ultrasound without findings of acute cholecystitis or symptomatic cholelithiasis.  No findings concerning for choledocholithiasis.  Given her recurrent episodes of vomiting and history of surgery will obtain a CT scan of the abdomen pelvis to further evaluate for intra-abdominal pathology or small bowel obstruction.  CT scan abdomen and pelvis without acute findings.  On reevaluation patient states he is feeling better.  Patient is already on a PPI.  Patient has a HIDA scan scheduled.  Discussed ongoing follow-up with gastroenterology.  Discussed going to her HIDA scan and return precautions for any ongoing or worsening symptoms.       PROCEDURES:  Critical Care performed: No  Procedures  Patient's presentation is most consistent with acute presentation with potential threat to life or bodily function.   MEDICATIONS ORDERED IN ED: Medications  sodium chloride 0.9 % bolus 1,000 mL (1,000 mLs Intravenous New Bag/Given 05/07/23 1935)  ondansetron [facility]) injection 4 mg (4 mg Intravenous Given 05/07/23 1936)  famotidine (PEPCID) IVPB 20 mg premix (0 mg Intravenous Stopped 05/07/23 1937)  ketorolac (TORADOL) 15 MG/ML injection 15 mg (15 mg Intravenous Given 05/07/23 1936)  iohexol (OMNIPAQUE) 300 MG/ML solution 100 mL (100 mLs Intravenous Contrast Given 05/07/23 1955)    FINAL CLINICAL IMPRESSION(S) / ED DIAGNOSES   Final diagnoses:  Nausea and vomiting, unspecified vomiting type     Rx / DC Orders   ED Discharge Orders     None        Note:  This document was prepared using Dragon voice  recognition software and may include unintentional dictation errors.   Corena Herter, MD 05/07/23 2127

## 2023-05-07 NOTE — Discharge Instructions (Signed)
You were seen in the emergency department for nausea and vomiting and upper abdominal pain.  You had an ultrasound of your gallbladder that was normal.  You had a CT scan of your abdomen and pelvis that did not show any findings to explain your pain today.  Your lab work was overall normal.  It is importantly follow-up closely with your primary care physician.  Go to the HIDA scan that you have ordered and scheduled.  Return to the emergency department for any worsening pain or signs of dehydration.  Thank you for choosing [place] for your health care, it was my pleasure to care for you today!  Corena Herter, MD

## 2023-05-07 NOTE — ED Provider Triage Note (Signed)
Emergency Medicine Provider Triage Evaluation Note  Teresa Crawford , a 38 y.o. female  was evaluated in triage.  Pt complains of epigastric pain, dark tarry stools, not feeling well.  Review of Systems  Positive:  Negative:   Physical Exam  BP 128/86 (BP Location: Left Arm)   Pulse 100   Temp 98 F (36.7 C) (Oral)   Resp 18   Ht 5\' 3"  (1.6 m)   Wt 94.3 kg   LMP 12/06/2015   SpO2 97%   BMI 36.85 kg/m  Gen:   Awake, no distress   Resp:  Normal effort  MSK:   Moves extremities without difficulty  Other:    Medical Decision Making  Medically screening exam initiated at 4:17 PM.  Appropriate orders placed.  ANNLEIGH KNUEPPEL was informed that the remainder of the evaluation will be completed by another provider, this initial triage assessment does not replace that evaluation, and the importance of remaining in the ED until their evaluation is complete.     Faythe Ghee, PA-C 05/07/23 1739

## 2023-05-07 NOTE — ED Triage Notes (Signed)
Pt sts that she has been vomiting with abd pain while having cold chills for the last several months.

## 2023-05-10 ENCOUNTER — Other Ambulatory Visit: Payer: Self-pay | Admitting: Internal Medicine

## 2023-05-10 ENCOUNTER — Ambulatory Visit: Payer: Medicare Other | Admitting: Nurse Practitioner

## 2023-05-10 NOTE — Telephone Encounter (Signed)
 Please review

## 2023-05-11 ENCOUNTER — Ambulatory Visit (INDEPENDENT_AMBULATORY_CARE_PROVIDER_SITE_OTHER): Payer: Medicare Other | Admitting: Internal Medicine

## 2023-05-11 ENCOUNTER — Encounter: Payer: Self-pay | Admitting: Internal Medicine

## 2023-05-11 VITALS — Ht 63.0 in | Wt 208.0 lb

## 2023-05-11 DIAGNOSIS — F331 Major depressive disorder, recurrent, moderate: Secondary | ICD-10-CM | POA: Diagnosis not present

## 2023-05-11 DIAGNOSIS — R112 Nausea with vomiting, unspecified: Secondary | ICD-10-CM | POA: Diagnosis not present

## 2023-05-11 MED ORDER — PROMETHAZINE HCL 12.5 MG PO TABS: 12.5000 mg | ORAL_TABLET | Freq: Three times a day (TID) | ORAL | 0 refills | Status: DC | PRN

## 2023-05-11 NOTE — Progress Notes (Signed)
 [facility] [address] [place], [place] [address]  Internal MEDICINE  Office Visit Note  Patient Name: Teresa Crawford  [id number]  [id number]  Date of Service: 06/01/2023  Chief Complaint  Patient presents with   Telephone Assessment    [phone]    Telephone Screen    ED follow up   Vomiting    Nausea     HPI  Pt is connected due to sick visit Recently EGD was done and since then pt has been vomiting, she did see blood tinged liquid, GI is aware She is requesting antinausea medicine  Was in ED few days ago  Has app with GI  Feeling depressed   Current Medication: Outpatient Encounter Medications as of 05/11/2023  Medication Sig   benzonatate  (TESSALON ) 200 MG capsule Take 200 mg by mouth 2 (two) times daily as needed for cough.   buPROPion  (WELLBUTRIN  XL) 150 MG 24 hr tablet Take 1 tablet (150 mg total) by mouth in the morning. Stop Bupropion  75 mg   cyanocobalamin  (VITAMIN B12) 1000 MCG tablet Take 1 tablet (1,000 mcg total) by mouth daily.   diphenhydrAMINE  (BENADRYL ) 25 mg capsule Take 25 mg by mouth every 6 (six) hours as needed for allergies.   docusate sodium  (COLACE) 100 MG capsule TAKE 1 CAPSULE BY MOUTH ONCE DAILY FOR STOOL SOFTENER   EPINEPHrine  0.3 mg/0.3 mL IJ SOAJ injection USE FOR ANAPHYLAXIS   methocarbamol (ROBAXIN) 500 MG tablet Take 1 tablet by mouth as needed for back spasms per Ortho.   ondansetron  (ZOFRAN -ODT) 4 MG disintegrating tablet Take 1 tablet (4 mg total) by mouth every 8 (eight) hours as needed for nausea or vomiting.   promethazine  (PHENERGAN ) 12.5 MG tablet Take 1 tablet (12.5 mg total) by mouth every 8 (eight) hours as needed for nausea or vomiting.   sucralfate  (CARAFATE ) 1 GM/10ML suspension Take 10 mLs (1 g total) by mouth 4 (four) times daily.   triamcinolone  cream (KENALOG ) 0.1 % Apply 1 Application topically 2 (two) times daily as needed (itching and rash).   [DISCONTINUED] cetirizine  (ZYRTEC  ALLERGY) 10 MG tablet  Take 1 tablet (10 mg total) by mouth daily.   [DISCONTINUED] omeprazole  (PRILOSEC) 40 MG capsule TAKE 1 CAPSULE BY MOUTH TWICE A DAY   [DISCONTINUED] promethazine  (PHENERGAN ) 12.5 MG tablet TAKE 2 TABLETS BY MOUTH EVERY 6 HOURS IFNEEDED FOR NAUSEA OR VOMITING FOR UP TO 7 DAYS   [DISCONTINUED] promethazine  (PHENERGAN ) 25 MG suppository Place 1 suppository (25 mg total) rectally every 6 (six) hours as needed for nausea or vomiting.   [DISCONTINUED] promethazine  (PHENERGAN ) 25 MG tablet Take 25-50 mg by mouth every 8 (eight) hours as needed.   [DISCONTINUED] topiramate  (TOPAMAX ) 50 MG tablet TAKE 1 TABLET BY MOUTH EVERY EVENING   [DISCONTINUED] traMADol  (ULTRAM ) 50 MG tablet ONE TAB PO BID PRN FOR PAIN AND HEADCAHES   [DISCONTINUED] Vitamin D , Ergocalciferol , (DRISDOL ) 1.25 MG (50000 UNIT) CAPS capsule Take 1 capsule (50,000 Units total) by mouth every 7 (seven) days.   No facility-administered encounter medications on file as of 05/11/2023.    Surgical History: Past Surgical History:  Procedure Laterality Date   ABDOMINAL HYSTERECTOMY     BACK SURGERY     BIOPSY  04/29/2023   Procedure: BIOPSY;  Surgeon: Unk Corinn Skiff, MD;  Location: [facility] ENDOSCOPY;  Service: Gastroenterology;;   COLONOSCOPY  04/27/2006   Wohl-colitis, internal hemorrhoids   COLONOSCOPY  02/2013   focal right colitis, left colon biopsy negative, focal active proctitis without chronicity  COLONOSCOPY WITH PROPOFOL  N/A 07/16/2022   Procedure: COLONOSCOPY WITH PROPOFOL ;  Surgeon: Unk Corinn Skiff, MD;  Location: [facility] ENDOSCOPY;  Service: Gastroenterology;  Laterality: N/A;   ESOPHAGOGASTRODUODENOSCOPY  02/2013   multiple peptic & duodenal ulcers, negative h pylori   ESOPHAGOGASTRODUODENOSCOPY (EGD) WITH PROPOFOL  N/A 04/29/2023   Procedure: ESOPHAGOGASTRODUODENOSCOPY (EGD) WITH PROPOFOL ;  Surgeon: Unk Corinn Skiff, MD;  Location: [facility] ENDOSCOPY;  Service: Gastroenterology;  Laterality: N/A;   INTRAUTERINE DEVICE  INSERTION  2012   LAPAROSCOPIC ENDOMETRIOSIS FULGURATION  2015   [name]   OVARIAN CYST REMOVAL      Medical History: Past Medical History:  Diagnosis Date   B12 deficiency anemia    Chronic back pain    Chronic fatigue syndrome    Constipation    Depression    Elevated liver enzymes    Endometriosis    Fibroids    Hepatic steatosis    Hypothyroid    Last menstrual period (LMP) > 10 days ago 09/12/15   Mental retardation    Migraine    Post traumatic stress disorder    Posttraumatic stress disorder    PUD (peptic ulcer disease)    Scoliosis    Splenomegaly    Uterine fibroid     Family History: Family History  Problem Relation Age of Onset   Breast cancer Mother 24   COPD Father    Lung cancer Father 8   Dementia Father    Diabetes Sister    Diabetes Brother    Ovarian cancer Paternal Aunt    Colon cancer Maternal Grandfather 72   Liver disease Maternal Aunt        ?etiology   Lymphoma Maternal Grandmother 34   Lung cancer Other 1       maternal great grandfather   Stomach cancer Other 29       maternal great grandmother    Social History   Socioeconomic History   Marital status: Single    Spouse name: Not on file   Number of children: 0   Years of education: Not on file   Highest education level: Not on file  Occupational History   Not on file  Tobacco Use   Smoking status: Never   Smokeless tobacco: Never  Vaping Use   Vaping status: Never Used  Substance and Sexual Activity   Alcohol use: Not Currently    Alcohol/week: 0.0 standard drinks of alcohol   Drug use: No   Sexual activity: Not Currently  Other Topics Concern   Not on file  Social History Narrative   She has brother, half brother & sister live nearby   Both parents deceased   Care managed by Elgin Hamilton   Social Drivers of Health   Financial Resource Strain: Low Risk  (02/11/2017)   Overall Financial Resource Strain (CARDIA)    Difficulty of Paying Living Expenses: Not hard at  all  Food Insecurity: No Food Insecurity (02/11/2017)   Hunger Vital Sign    Worried About Running Out of Food in the Last Year: Never true    Ran Out of Food in the Last Year: Never true  Transportation Needs: No Transportation Needs (02/11/2017)   PRAPARE - Administrator, Civil Service (Medical): No    Lack of Transportation (Non-Medical): No  Physical Activity: Inactive (02/11/2017)   Exercise Vital Sign    Days of Exercise per Week: 0 days    Minutes of Exercise per Session: 0 min  Stress: No Stress Concern Present (  02/11/2017)   [employer] - Occupational Stress Questionnaire    Feeling of Stress : Not at all  Social Connections: Unknown (02/11/2017)   Social Connection and Isolation Panel [NHANES]    Frequency of Communication with Friends and Family: Patient declined    Frequency of Social Gatherings with Friends and Family: Patient declined    Attends Religious Services: Patient declined    Database Administrator or Organizations: Patient declined    Attends Banker Meetings: Patient declined    Marital Status: Patient declined  Intimate Partner Violence: Unknown (02/11/2017)   Humiliation, Afraid, Rape, and Kick questionnaire    Fear of Current or Ex-Partner: Patient declined    Emotionally Abused: Patient declined    Physically Abused: Patient declined    Sexually Abused: Patient declined      Review of Systems  Constitutional:  Negative for fatigue and fever.  HENT:  Negative for congestion, mouth sores and postnasal drip.   Respiratory:  Positive for cough.   Cardiovascular:  Negative for chest pain.  Gastrointestinal:  Positive for nausea.  Genitourinary:  Negative for flank pain.  Psychiatric/Behavioral: Negative.      Vital Signs: Ht 5' 3 (1.6 m)   Wt 208 lb (94.3 kg)   LMP 12/06/2015   BMI 36.85 kg/m    Physical Exam NO EXAM IS PERFORMED     Assessment/Plan: 1. Nausea and vomiting, unspecified  vomiting type (Primary) Refilled phenergan , pt will see GI   2. MDD (major depressive disorder), recurrent episode, moderate (HCC) Followed by psych    General Counseling: Rosina oakland understanding of the findings of todays visit and agrees with plan of treatment. I have discussed any further diagnostic evaluation that may be needed or ordered today. We also reviewed her medications today. she has been encouraged to call the office with any questions or concerns that should arise related to todays visit.    No orders of the defined types were placed in this encounter.   Meds ordered this encounter  Medications   promethazine  (PHENERGAN ) 12.5 MG tablet    Sig: Take 1 tablet (12.5 mg total) by mouth every 8 (eight) hours as needed for nausea or vomiting.    Dispense:  45 tablet    Refill:  0    Total time spent:15 Minutes Time spent includes review of chart, medications, test results, and follow up plan with the patient.   [place] Controlled Substance Database was reviewed by me.   Dr Nayan Proch M Issac Moure Internal medicine

## 2023-05-13 ENCOUNTER — Ambulatory Visit
Admission: RE | Admit: 2023-05-13 | Discharge: 2023-05-13 | Disposition: A | Discharge status: Home / Self Care | Payer: Medicare Other | Source: Ambulatory Visit | Attending: Gastroenterology | Admitting: Gastroenterology

## 2023-05-13 DIAGNOSIS — R1013 Epigastric pain: Secondary | ICD-10-CM | POA: Insufficient documentation

## 2023-05-13 DIAGNOSIS — R1011 Right upper quadrant pain: Secondary | ICD-10-CM | POA: Diagnosis not present

## 2023-05-13 MED ORDER — TECHNETIUM TC 99M MEBROFENIN IV KIT
5.0000 | PACK | Freq: Once | INTRAVENOUS | Status: AC | PRN
  Administered 2023-05-13: 5.38 via INTRAVENOUS

## 2023-05-14 ENCOUNTER — Telehealth: Payer: Self-pay

## 2023-05-14 ENCOUNTER — Ambulatory Visit (INDEPENDENT_AMBULATORY_CARE_PROVIDER_SITE_OTHER): Payer: Medicare Other | Admitting: Licensed Clinical Social Worker

## 2023-05-14 DIAGNOSIS — F330 Major depressive disorder, recurrent, mild: Secondary | ICD-10-CM

## 2023-05-14 DIAGNOSIS — F4310 Post-traumatic stress disorder, unspecified: Secondary | ICD-10-CM | POA: Diagnosis not present

## 2023-05-14 DIAGNOSIS — F79 Unspecified intellectual disabilities: Secondary | ICD-10-CM

## 2023-05-14 DIAGNOSIS — G4710 Hypersomnia, unspecified: Secondary | ICD-10-CM

## 2023-05-14 NOTE — Addendum Note (Signed)
 Addended by: Almetta Jacquet B on: 05/14/2023 11:45 AM   Modules accepted: Level of Service

## 2023-05-14 NOTE — Progress Notes (Signed)
 THERAPIST PROGRESS NOTE  Virtual Visit via Video Note  I connected with Teresa Crawford on 05/14/23 at 11:00 AM EST by a video enabled telemedicine application and verified that I am speaking with the correct person using two identifiers.  Location: Patient: Address on file  Provider: Providers address   I discussed the limitations of evaluation and management by telemedicine and the availability of in person appointments. The patient expressed understanding and agreed to proceed.   I discussed the assessment and treatment plan with the patient. The patient was provided an opportunity to ask questions and all were answered. The patient agreed with the plan and demonstrated an understanding of the instructions.   The patient was advised to call back or seek an in-person evaluation if the symptoms worsen or if the condition fails to improve as anticipated.  I provided 43 minutes of non-face-to-face time during this encounter.   Teresa KATHEE Husband, Teresa Crawford   Session Time: 11-11:43 am  Participation Level: Active  Behavioral Response: CasualAlertDepressed  Type of Therapy: Individual Therapy  Treatment Goals addressed:  Goal: LTG: Doria will score less than 5 on the Generalized Anxiety Disorder 7 Scale (GAD-7)                                   Goal: LTG: Pt identified she would like to decrease fear as a result of thought that something bad will happen.        ProgressTowards Goals: Progressing  Interventions: CBT, Supportive, and Reframing  Summary: Teresa Crawford is a 38 y.o. female who presents with symptoms of anxiety and bereavement. Patient identifies symptoms to include uncontrollable worry, negative self affect, low mood, fatigue, tearfulness, frequent memories related to the passing of her loved ones, avoidance related to visiting spaces where her loved ones passed and isolating from family. Pt was oriented times 5. Pt was cooperative and engaged. Pt denies  SI/HI/AVH.   Pt utilized therapeutic space to process recent health issues. Reflected on recent hospital visit. Addressed feelings of frustration about unresolved answers. Addressed ways in which the patient can be assertive in advocating herself.   Reflected on self care as patient is dealing with intense pain. Addressed ways patient can obtain factual information rather than researching her test results.   Suicidal/Homicidal: Nowithout intent/plan  Therapist Response: Clinician utilized active and supportive reflection to create a safe environment for patient to process recent life stressors. Clinician assessed for current stressors, symptoms, and safety since last session. Addressed ways pt can use assertive communication. Reviewed coping skills.   Plan: Return again in 4 weeks.  Diagnosis: MDD (major depressive disorder), recurrent episode, mild (HCC)  Posttraumatic stress disorder  Hypersomnia  Intellectual disability   Collaboration of Care: Psychiatrist AEB AEB psychiatrist can access notes and cln. Will review psychiatrists' notes. Check in with the patient and will see Teresa Crawford per availability. Patient agreed with treatment recommendations.   Patient/Guardian was advised Release of Information must be obtained prior to any record release in order to collaborate their care with an outside provider. Patient/Guardian was advised if they have not already done so to contact the registration department to sign all necessary forms in order for us  to release information regarding their care.   Consent: Patient/Guardian gives verbal consent for treatment and assignment of benefits for services provided during this visit. Patient/Guardian expressed understanding and agreed to proceed.   Teresa KATHEE Husband, Teresa Crawford 05/14/2023

## 2023-05-14 NOTE — Telephone Encounter (Signed)
 Called patient and patient verbalized understanding of results

## 2023-05-14 NOTE — Telephone Encounter (Signed)
-----   Message from [facility] sent at 05/13/2023  4:55 PM EST ----- Normal HIDA scan results, normal gall bladder function  RV

## 2023-05-19 NOTE — Progress Notes (Signed)
Differential includes pregnancy given that she had nausea and vomiting, ongoing nausea and vomiting long-term can be caused by hyperthyroidism so check TSH.

## 2023-05-24 ENCOUNTER — Telehealth: Payer: Self-pay | Admitting: Nurse Practitioner

## 2023-05-24 ENCOUNTER — Encounter: Payer: Self-pay | Admitting: Nurse Practitioner

## 2023-05-24 ENCOUNTER — Ambulatory Visit (INDEPENDENT_AMBULATORY_CARE_PROVIDER_SITE_OTHER): Payer: Medicare Other | Admitting: Nurse Practitioner

## 2023-05-24 VITALS — BP 124/80 | HR 73 | Temp 97.6°F | Resp 16 | Ht 63.0 in | Wt 204.8 lb

## 2023-05-24 DIAGNOSIS — Z8041 Family history of malignant neoplasm of ovary: Secondary | ICD-10-CM

## 2023-05-24 DIAGNOSIS — H838X3 Other specified diseases of inner ear, bilateral: Secondary | ICD-10-CM

## 2023-05-24 DIAGNOSIS — K30 Functional dyspepsia: Secondary | ICD-10-CM | POA: Diagnosis not present

## 2023-05-24 DIAGNOSIS — Z79899 Other long term (current) drug therapy: Secondary | ICD-10-CM

## 2023-05-24 DIAGNOSIS — R197 Diarrhea, unspecified: Secondary | ICD-10-CM | POA: Diagnosis not present

## 2023-05-24 DIAGNOSIS — R1114 Bilious vomiting: Secondary | ICD-10-CM | POA: Diagnosis not present

## 2023-05-24 DIAGNOSIS — J301 Allergic rhinitis due to pollen: Secondary | ICD-10-CM | POA: Diagnosis not present

## 2023-05-24 DIAGNOSIS — G9331 Postviral fatigue syndrome: Secondary | ICD-10-CM | POA: Diagnosis not present

## 2023-05-24 DIAGNOSIS — Z8742 Personal history of other diseases of the female genital tract: Secondary | ICD-10-CM

## 2023-05-24 DIAGNOSIS — R6881 Early satiety: Secondary | ICD-10-CM

## 2023-05-24 DIAGNOSIS — R1312 Dysphagia, oropharyngeal phase: Secondary | ICD-10-CM | POA: Diagnosis not present

## 2023-05-24 DIAGNOSIS — Z760 Encounter for issue of repeat prescription: Secondary | ICD-10-CM

## 2023-05-24 MED ORDER — DIPHENOXYLATE-ATROPINE 2.5-0.025 MG PO TABS: 1.0000 | ORAL_TABLET | Freq: Four times a day (QID) | ORAL | 3 refills | Status: AC | PRN

## 2023-05-24 MED ORDER — OMEPRAZOLE 40 MG PO CPDR: 40.0000 mg | DELAYED_RELEASE_CAPSULE | Freq: Two times a day (BID) | ORAL | 5 refills | Status: DC

## 2023-05-24 MED ORDER — TRAMADOL HCL 50 MG PO TABS: ORAL_TABLET | ORAL | 2 refills | Status: AC

## 2023-05-24 MED ORDER — CETIRIZINE HCL 10 MG PO TABS: 10.0000 mg | ORAL_TABLET | Freq: Every day | ORAL | 1 refills | Status: DC

## 2023-05-24 MED ORDER — TOPIRAMATE 50 MG PO TABS: 50.0000 mg | ORAL_TABLET | Freq: Every evening | ORAL | 5 refills | Status: DC

## 2023-05-24 NOTE — Progress Notes (Unsigned)
[facility] [address] [place], [place] [address]  Internal MEDICINE  Office Visit Note  Patient Name: Teresa Crawford  [id number]  [id number]  Date of Service: 05/24/2023  Chief Complaint  Patient presents with   Depression   Follow-up    HPI Teresa Crawford presents for a follow-up visit for  Symptoms of gastroparesis without diabetes --  HIDA scan was normal . Persistent diarrhea --      Current Medication: Outpatient Encounter Medications as of 05/24/2023  Medication Sig   benzonatate (TESSALON) 200 MG capsule Take 200 mg by mouth 2 (two) times daily as needed for cough.   buPROPion (WELLBUTRIN XL) 150 MG 24 hr tablet Take 1 tablet (150 mg total) by mouth in the morning. Stop Bupropion 75 mg   cyanocobalamin (VITAMIN B12) 1000 MCG tablet Take 1 tablet (1,000 mcg total) by mouth daily.   diphenhydrAMINE (BENADRYL) 25 mg capsule Take 25 mg by mouth every 6 (six) hours as needed for allergies.   docusate sodium (COLACE) 100 MG capsule TAKE 1 CAPSULE BY MOUTH ONCE DAILY FOR STOOL SOFTENER   EPINEPHrine 0.3 mg/0.3 mL IJ SOAJ injection USE FOR ANAPHYLAXIS   methocarbamol (ROBAXIN) 500 MG tablet Take 1 tablet by mouth as needed for back spasms per Ortho.   ondansetron (ZOFRAN-ODT) 4 MG disintegrating tablet Take 1 tablet (4 mg total) by mouth every 8 (eight) hours as needed for nausea or vomiting.   promethazine (PHENERGAN) 12.5 MG tablet Take 1 tablet (12.5 mg total) by mouth every 8 (eight) hours as needed for nausea or vomiting.   sucralfate (CARAFATE) 1 GM/10ML suspension Take 10 mLs (1 g total) by mouth 4 (four) times daily.   triamcinolone cream (KENALOG) 0.1 % Apply 1 Application topically 2 (two) times daily as needed (itching and rash).   Vitamin D, Ergocalciferol, (DRISDOL) 1.25 MG (50000 UNIT) CAPS capsule Take 1 capsule (50,000 Units total) by mouth every 7 (seven) days.   [DISCONTINUED] cetirizine (ZYRTEC ALLERGY) 10 MG tablet Take 1 tablet (10 mg total) by  mouth daily.   [DISCONTINUED] omeprazole (PRILOSEC) 40 MG capsule TAKE 1 CAPSULE BY MOUTH TWICE A DAY   [DISCONTINUED] topiramate (TOPAMAX) 50 MG tablet TAKE 1 TABLET BY MOUTH EVERY EVENING   [DISCONTINUED] traMADol (ULTRAM) 50 MG tablet ONE TAB PO BID PRN FOR PAIN AND HEADCAHES   cetirizine (ZYRTEC ALLERGY) 10 MG tablet Take 1 tablet (10 mg total) by mouth daily.   diphenoxylate-atropine (LOMOTIL) 2.5-0.025 MG tablet Take 1 tablet by mouth 4 (four) times daily as needed for diarrhea or loose stools.   omeprazole (PRILOSEC) 40 MG capsule Take 1 capsule (40 mg total) by mouth 2 (two) times daily.   topiramate (TOPAMAX) 50 MG tablet Take 1 tablet (50 mg total) by mouth every evening.   traMADol (ULTRAM) 50 MG tablet ONE TAB PO BID PRN FOR PAIN AND HEADCAHES   No facility-administered encounter medications on file as of 05/24/2023.    Surgical History: Past Surgical History:  Procedure Laterality Date   ABDOMINAL HYSTERECTOMY     BACK SURGERY     BIOPSY  04/29/2023   Procedure: BIOPSY;  Surgeon: Toney Reil, MD;  Location: [facility] ENDOSCOPY;  Service: Gastroenterology;;   COLONOSCOPY  04/27/2006   Wohl-colitis, internal hemorrhoids   COLONOSCOPY  02/2013   focal right colitis, left colon biopsy negative, focal active proctitis without chronicity   COLONOSCOPY WITH PROPOFOL N/A 07/16/2022   Procedure: COLONOSCOPY WITH PROPOFOL;  Surgeon: Toney Reil, MD;  Location: [facility]  ENDOSCOPY;  Service: Gastroenterology;  Laterality: N/A;   ESOPHAGOGASTRODUODENOSCOPY  02/2013   multiple peptic & duodenal ulcers, negative h pylori   ESOPHAGOGASTRODUODENOSCOPY (EGD) WITH PROPOFOL N/A 04/29/2023   Procedure: ESOPHAGOGASTRODUODENOSCOPY (EGD) WITH PROPOFOL;  Surgeon: Toney Reil, MD;  Location: [facility] ENDOSCOPY;  Service: Gastroenterology;  Laterality: N/A;   INTRAUTERINE DEVICE INSERTION  2012   LAPAROSCOPIC ENDOMETRIOSIS FULGURATION  2015   [name]   OVARIAN CYST REMOVAL      Medical  History: Past Medical History:  Diagnosis Date   B12 deficiency anemia    Chronic back pain    Chronic fatigue syndrome    Constipation    Depression    Elevated liver enzymes    Endometriosis    Fibroids    Hepatic steatosis    Hypothyroid    Last menstrual period (LMP) > 10 days ago 09/12/15   Mental retardation    Migraine    Post traumatic stress disorder    Posttraumatic stress disorder    PUD (peptic ulcer disease)    Scoliosis    Splenomegaly    Uterine fibroid     Family History: Family History  Problem Relation Age of Onset   Breast cancer Mother 6   COPD Father    Lung cancer Father 57   Dementia Father    Diabetes Sister    Diabetes Brother    Ovarian cancer Paternal Aunt    Colon cancer Maternal Grandfather 79   Liver disease Maternal Aunt        ?etiology   Lymphoma Maternal Grandmother 22   Lung cancer Other 87       maternal great grandfather   Stomach cancer Other 73       maternal great grandmother    Social History   Socioeconomic History   Marital status: Single    Spouse name: Not on file   Number of children: 0   Years of education: Not on file   Highest education level: Not on file  Occupational History   Not on file  Tobacco Use   Smoking status: Never   Smokeless tobacco: Never  Vaping Use   Vaping status: Never Used  Substance and Sexual Activity   Alcohol use: Not Currently    Alcohol/week: 0.0 standard drinks of alcohol   Drug use: No   Sexual activity: Not Currently  Other Topics Concern   Not on file  Social History Narrative   She has brother, half brother & sister live nearby   Both parents deceased   Care managed by Anselm Pancoast   Social Drivers of Health   Financial Resource Strain: Low Risk  (02/11/2017)   Overall Financial Resource Strain (CARDIA)    Difficulty of Paying Living Expenses: Not hard at all  Food Insecurity: No Food Insecurity (02/11/2017)   Hunger Vital Sign    Worried About Running Out of Food  in the Last Year: Never true    Ran Out of Food in the Last Year: Never true  Transportation Needs: No Transportation Needs (02/11/2017)   PRAPARE - Administrator, Civil Service (Medical): No    Lack of Transportation (Non-Medical): No  Physical Activity: Inactive (02/11/2017)   Exercise Vital Sign    Days of Exercise per Week: 0 days    Minutes of Exercise per Session: 0 min  Stress: No Stress Concern Present (02/11/2017)   [employer] of Occupational Health - Occupational Stress Questionnaire    Feeling of Stress : Not  at all  Social Connections: Unknown (02/11/2017)   Social Connection and Isolation Panel [NHANES]    Frequency of Communication with Friends and Family: Patient declined    Frequency of Social Gatherings with Friends and Family: Patient declined    Attends Religious Services: Patient declined    Database administrator or Organizations: Patient declined    Attends Banker Meetings: Patient declined    Marital Status: Patient declined  Intimate Partner Violence: Unknown (02/11/2017)   Humiliation, Afraid, Rape, and Kick questionnaire    Fear of Current or Ex-Partner: Patient declined    Emotionally Abused: Patient declined    Physically Abused: Patient declined    Sexually Abused: Patient declined      Review of Systems  Vital Signs: BP 124/80   Pulse 73   Temp 97.6 F (36.4 C)   Resp 16   Ht 5\' 3"  (1.6 m)   Wt 204 lb 12.8 oz (92.9 kg)   LMP 12/06/2015   SpO2 96%   BMI 36.28 kg/m    Physical Exam     Assessment/Plan:   General Counseling: Teresa Crawford verbalizes understanding of the findings of todays visit and agrees with plan of treatment. I have discussed any further diagnostic evaluation that may be needed or ordered today. We also reviewed her medications today. she has been encouraged to call the office with any questions or concerns that should arise related to todays visit.    Orders Placed This Encounter   Procedures   Ambulatory referral to Obstetrics / Gynecology    Meds ordered this encounter  Medications   diphenoxylate-atropine (LOMOTIL) 2.5-0.025 MG tablet    Sig: Take 1 tablet by mouth 4 (four) times daily as needed for diarrhea or loose stools.    Dispense:  30 tablet    Refill:  3   traMADol (ULTRAM) 50 MG tablet    Sig: ONE TAB PO BID PRN FOR PAIN AND HEADCAHES    Dispense:  60 tablet    Refill:  2   topiramate (TOPAMAX) 50 MG tablet    Sig: Take 1 tablet (50 mg total) by mouth every evening.    Dispense:  30 tablet    Refill:  5    FOR FUTURE USE PLEASE THANKS   omeprazole (PRILOSEC) 40 MG capsule    Sig: Take 1 capsule (40 mg total) by mouth 2 (two) times daily.    Dispense:  60 capsule    Refill:  5    FOR FUTURE USE PLEASE THANKS   cetirizine (ZYRTEC ALLERGY) 10 MG tablet    Sig: Take 1 tablet (10 mg total) by mouth daily.    Dispense:  90 tablet    Refill:  1    Return in about 1 month (around 06/21/2023) for F/U, Ermina Oberman PCP gastric testing.   Total time spent:*** Minutes Time spent includes review of chart, medications, test results, and follow up plan with the patient.   [place] Controlled Substance Database was reviewed by me.  This patient was seen by Sallyanne Kuster, FNP-C in collaboration with Dr. Beverely Risen as a part of collaborative care agreement.   Fynley Chrystal R. Tedd Sias, MSN, FNP-C Internal medicine

## 2023-05-24 NOTE — Telephone Encounter (Signed)
OB/GYN referral sent via [facility]. Highlighted for patient on AVS-[facility]

## 2023-05-25 ENCOUNTER — Other Ambulatory Visit: Payer: Self-pay | Admitting: Nurse Practitioner

## 2023-05-31 ENCOUNTER — Telehealth: Payer: Self-pay | Admitting: Nurse Practitioner

## 2023-05-31 DIAGNOSIS — J029 Acute pharyngitis, unspecified: Secondary | ICD-10-CM | POA: Diagnosis not present

## 2023-05-31 DIAGNOSIS — R519 Headache, unspecified: Secondary | ICD-10-CM | POA: Diagnosis not present

## 2023-05-31 DIAGNOSIS — R112 Nausea with vomiting, unspecified: Secondary | ICD-10-CM | POA: Diagnosis not present

## 2023-05-31 DIAGNOSIS — Z3202 Encounter for pregnancy test, result negative: Secondary | ICD-10-CM | POA: Diagnosis not present

## 2023-05-31 NOTE — Telephone Encounter (Signed)
 Patient called requesting sick visit for possible covid or flu. Per Melecia, since no open appointments until Wednesday 06/02/23, patient advised to go to urgent care or ED-[facility]

## 2023-06-01 ENCOUNTER — Other Ambulatory Visit: Payer: Self-pay | Admitting: Nurse Practitioner

## 2023-06-01 DIAGNOSIS — E559 Vitamin D deficiency, unspecified: Secondary | ICD-10-CM

## 2023-06-03 ENCOUNTER — Telehealth: Payer: Medicare Other | Admitting: Psychiatry

## 2023-06-03 ENCOUNTER — Encounter: Payer: Self-pay | Admitting: Psychiatry

## 2023-06-03 DIAGNOSIS — F330 Major depressive disorder, recurrent, mild: Secondary | ICD-10-CM

## 2023-06-03 DIAGNOSIS — G4710 Hypersomnia, unspecified: Secondary | ICD-10-CM

## 2023-06-03 DIAGNOSIS — F79 Unspecified intellectual disabilities: Secondary | ICD-10-CM

## 2023-06-03 DIAGNOSIS — F4310 Post-traumatic stress disorder, unspecified: Secondary | ICD-10-CM | POA: Diagnosis not present

## 2023-06-03 MED ORDER — BUPROPION HCL ER (XL) 150 MG PO TB24: 150.0000 mg | ORAL_TABLET | Freq: Every morning | ORAL | 1 refills | Status: DC

## 2023-06-03 NOTE — Progress Notes (Signed)
 Virtual Visit via Video Note  I connected with Teresa Crawford on 06/03/23 at 10:00 AM EST by a video enabled telemedicine application and verified that I am speaking with the correct person using two identifiers.  Location Provider Location : [facility] Patient Location : Home  Participants: Patient , Provider    I discussed the limitations of evaluation and management by telemedicine and the availability of in person appointments. The patient expressed understanding and agreed to proceed.   I discussed the assessment and treatment plan with the patient. The patient was provided an opportunity to ask questions and all were answered. The patient agreed with the plan and demonstrated an understanding of the instructions.   The patient was advised to call back or seek an in-person evaluation if the symptoms worsen or if the condition fails to improve as anticipated.   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  The patient has a history of depression and PTSD and is currently taking Wellbutrin 150 mg and Fluoxetine, while continuing psychotherapy. The increased dosage of Wellbutrin has alleviated fatigue and excessive sleepiness. She reports no current symptoms of depression, sadness, hopelessness, or thoughts of self-harm or harm to others.  Hypersomnia has been a concern, but the increased dosage of Wellbutrin has improved her excessive sleepiness. She feels more energetic and manages her sleep schedule by staying busy.  She is experiencing symptoms of a severe cold, including difficulty hearing and feeling  very sick. COVID-19 and flu tests were negative. She has had COVID-19 five times, significantly impacting her health, and is cautious about exposure, wearing a mask when going out.  Gastrointestinal issues include an inability to keep food and fluids down, except for Ensure, which she can tolerate. She feels full after only a few bites of food.  She is currently following up with her primary care for management of the same.  Currently compliant on all her medications and denies side effects.  Visit Diagnosis:    ICD-10-CM   1. Mild episode of recurrent major depressive disorder (HCC)  F33.0 buPROPion (WELLBUTRIN XL) 150 MG 24 hr tablet   improving    2. Posttraumatic stress disorder  F43.10     3. Hypersomnia  G47.10     4. Intellectual disability  F79    Likely Mild      Past Psychiatric History: I have reviewed past psychiatric history from my progress note on 08/16/2018.  Past Medical History:  Past Medical History:  Diagnosis Date   B12 deficiency anemia    Chronic back pain    Chronic fatigue syndrome    Constipation    Depression    Elevated liver enzymes    Endometriosis    Fibroids    Hepatic steatosis    Hypothyroid    Last menstrual period (LMP) > 10 days ago 09/12/15   Mental retardation    Migraine    Post traumatic stress disorder    Posttraumatic stress disorder    PUD (peptic ulcer disease)    Scoliosis    Splenomegaly    Uterine fibroid     Past  Surgical History:  Procedure Laterality Date   ABDOMINAL HYSTERECTOMY     BACK SURGERY     BIOPSY  04/29/2023   Procedure: BIOPSY;  Surgeon: Toney Reil, MD;  Location: [facility] ENDOSCOPY;  Service: Gastroenterology;;   COLONOSCOPY  04/27/2006   Wohl-colitis, internal hemorrhoids   COLONOSCOPY  02/2013   focal right colitis, left colon biopsy negative, focal active proctitis without chronicity   COLONOSCOPY WITH PROPOFOL N/A 07/16/2022   Procedure: COLONOSCOPY WITH PROPOFOL;  Surgeon: Toney Reil,  MD;  Location: [facility] ENDOSCOPY;  Service: Gastroenterology;  Laterality: N/A;   ESOPHAGOGASTRODUODENOSCOPY  02/2013   multiple peptic & duodenal ulcers, negative h pylori   ESOPHAGOGASTRODUODENOSCOPY (EGD) WITH PROPOFOL N/A 04/29/2023   Procedure: ESOPHAGOGASTRODUODENOSCOPY (EGD) WITH PROPOFOL;  Surgeon: Toney Reil, MD;  Location: [facility] ENDOSCOPY;  Service: Gastroenterology;  Laterality: N/A;   INTRAUTERINE DEVICE INSERTION  2012   LAPAROSCOPIC ENDOMETRIOSIS FULGURATION  2015   [name]   OVARIAN CYST REMOVAL      Family Psychiatric History: I have reviewed family psychiatric history provide progress note on 08/16/2018.  Family History:  Family History  Problem Relation Age of Onset   Breast cancer Mother 60   COPD Father    Lung cancer Father 22   Dementia Father    Diabetes Sister    Diabetes Brother    Ovarian cancer Paternal Aunt    Colon cancer Maternal Grandfather 76   Liver disease Maternal Aunt        ?etiology   Lymphoma Maternal Grandmother 77   Lung cancer Other 40       maternal great grandfather   Stomach cancer Other 36       maternal great grandmother    Social History: I have reviewed social history from my progress note on 08/16/2018. Social History   Socioeconomic History   Marital status: Single    Spouse name: Not on file   Number of children: 0   Years of education: Not on file   Highest education level: Not on file  Occupational History   Not on file  Tobacco Use   Smoking status: Never   Smokeless tobacco: Never  Vaping Use   Vaping status: Never Used  Substance and Sexual Activity   Alcohol use: Not Currently    Alcohol/week: 0.0 standard drinks of alcohol   Drug use: No   Sexual activity: Not Currently  Other Topics Concern   Not on file  Social History Narrative   She has brother, half brother & sister live nearby   Both parents deceased   Care managed by Anselm Pancoast   Social Drivers of Health   Financial Resource Strain: Low  Risk  (02/11/2017)   Overall Financial Resource Strain (CARDIA)    Difficulty of Paying Living Expenses: Not hard at all  Food Insecurity: No Food Insecurity (02/11/2017)   Hunger Vital Sign    Worried About Running Out of Food in the Last Year: Never true    Ran Out of Food in the Last Year: Never true  Transportation Needs: No Transportation Needs (02/11/2017)   PRAPARE - Administrator, Civil Service (Medical): No    Lack of Transportation (Non-Medical): No  Physical Activity: Inactive (02/11/2017)   Exercise Vital Sign    Days of Exercise per Week: 0 days    Minutes of Exercise per Session: 0 min  Stress: No Stress Concern Present (02/11/2017)   [employer] of Occupational Health - Occupational Stress Questionnaire  Feeling of Stress : Not at all  Social Connections: Unknown (02/11/2017)   Social Connection and Isolation Panel [NHANES]    Frequency of Communication with Friends and Family: Patient declined    Frequency of Social Gatherings with Friends and Family: Patient declined    Attends Religious Services: Patient declined    Database administrator or Organizations: Patient declined    Attends Banker Meetings: Patient declined    Marital Status: Patient declined    Allergies:  Allergies  Allergen Reactions   Other Anaphylaxis    Peanut butter, uses Epi pen   Peanut Butter Flavoring Agent (Non-Screening) Swelling    Throat swells Throat swells   Sertraline Other (See Comments)    Panic attacks   Codeine Nausea And Vomiting    nausea   Paroxetine Other (See Comments)    Panic Attack    Paroxetine Hcl Other (See Comments)    Panic attack Other reaction(s): Other (See Comments) Panic attacks   Prednisone & Diphenhydramine Rash   Venlafaxine Other (See Comments)    Panic Attack    Zoloft [Sertraline Hcl] Other (See Comments)    Panic attack    Metabolic Disorder Labs: Lab Results  Component Value Date   HGBA1C 5.5 12/20/2019    No results found for: "PROLACTIN" Lab Results  Component Value Date   CHOL 155 01/15/2022   TRIG 97 01/15/2022   HDL 34 (L) 01/15/2022   CHOLHDL 4.6 (H) 01/15/2022   LDLCALC 103 (H) 01/15/2022   LDLCALC 99 12/27/2020   Lab Results  Component Value Date   TSH 2.780 05/07/2023   TSH 3.060 12/28/2022    Therapeutic Level Labs: No results found for: "LITHIUM" No results found for: "VALPROATE" No results found for: "CBMZ"  Current Medications: Current Outpatient Medications  Medication Sig Dispense Refill   COMBIVENT RESPIMAT 20-100 MCG/ACT AERS respimat Inhale 1 puff into the lungs 4 (four) times daily.     loperamide (IMODIUM) 2 MG capsule      benzonatate (TESSALON) 200 MG capsule Take 200 mg by mouth 2 (two) times daily as needed for cough.     buPROPion (WELLBUTRIN XL) 150 MG 24 hr tablet Take 1 tablet (150 mg total) by mouth in the morning. Stop Bupropion 75 mg 30 tablet 1   cetirizine (ZYRTEC ALLERGY) 10 MG tablet Take 1 tablet (10 mg total) by mouth daily. 90 tablet 1   cyanocobalamin (VITAMIN B12) 1000 MCG tablet Take 1 tablet (1,000 mcg total) by mouth daily. 90 tablet 2   diphenhydrAMINE (BENADRYL) 25 mg capsule Take 25 mg by mouth every 6 (six) hours as needed for allergies.     diphenoxylate-atropine (LOMOTIL) 2.5-0.025 MG tablet Take 1 tablet by mouth 4 (four) times daily as needed for diarrhea or loose stools. 30 tablet 3   docusate sodium (COLACE) 100 MG capsule TAKE 1 CAPSULE BY MOUTH ONCE DAILY FOR STOOL SOFTENER 90 capsule 1   EPINEPHrine 0.3 mg/0.3 mL IJ SOAJ injection USE FOR ANAPHYLAXIS 1 each 5   methocarbamol (ROBAXIN) 500 MG tablet Take 1 tablet by mouth as needed for back spasms per Ortho.     omeprazole (PRILOSEC) 40 MG capsule Take 1 capsule (40 mg total) by mouth 2 (two) times daily. 60 capsule 5   ondansetron (ZOFRAN-ODT) 4 MG disintegrating tablet Take 1 tablet (4 mg total) by mouth every 8 (eight) hours as needed for nausea or vomiting. 20 tablet 0    promethazine (PHENERGAN) 12.5 MG tablet Take 1  tablet (12.5 mg total) by mouth every 8 (eight) hours as needed for nausea or vomiting. 45 tablet 0   sucralfate (CARAFATE) 1 GM/10ML suspension Take 10 mLs (1 g total) by mouth 4 (four) times daily. 420 mL 0   topiramate (TOPAMAX) 50 MG tablet Take 1 tablet (50 mg total) by mouth every evening. 30 tablet 5   traMADol (ULTRAM) 50 MG tablet ONE TAB PO BID PRN FOR PAIN AND HEADCAHES 60 tablet 2   triamcinolone cream (KENALOG) 0.1 % Apply 1 Application topically 2 (two) times daily as needed (itching and rash). 453.6 g 1   Vitamin D, Ergocalciferol, (DRISDOL) 1.25 MG (50000 UNIT) CAPS capsule TAKE 1 CAPSULE BY MOUTH EVERY 7 DAYS 12 capsule 1   No current facility-administered medications for this visit.     Musculoskeletal: Strength & Muscle Tone:  UTA Gait & Station: normal Patient leans: N/A  Psychiatric Specialty Exam: Review of Systems  Psychiatric/Behavioral:  The patient is nervous/anxious.     Last menstrual period 12/06/2015.There is no height or weight on file to calculate BMI.  General Appearance: Casual  Eye Contact:  Fair  Speech:  Normal Rate  Volume:  Normal  Mood:  Anxious due to currently being sick  Affect:  Congruent  Thought Process:  Goal Directed and Descriptions of Associations: Intact  Orientation:  Full (Time, Place, and Person)  Thought Content: Logical   Suicidal Thoughts:  No  Homicidal Thoughts:  No  Memory:  Immediate;   Fair Recent;   Fair Remote;   Fair  Judgement:  Fair  Insight:  Fair  Psychomotor Activity:  Normal  Concentration:  Concentration: Fair and Attention Span: Fair  Recall:  [employer] of Knowledge: Fair  Language: Fair  Akathisia:  No  Handed:  Right  AIMS (if indicated): not done  Assets:  Desire for Improvement Housing Social Support Transportation  ADL's:  Intact  Cognition: WNL  Sleep:   Improving   Screenings: AIMS    Flowsheet Row Video Visit from 01/20/2022 in  [facility] Video Visit from 10/22/2021 in [facility]  AIMS Total Score 0 0      GAD-7    Flowsheet Row Office Visit from 02/24/2023 in [facility] Office Visit from 04/21/2022 in [facility] Video Visit from 01/20/2022 in [facility] Video Visit from 10/22/2021 in [facility] Office Visit from 04/08/2021 in [facility]  Total GAD-7 Score 0 0 1 0 0      Mini-Mental    Flowsheet Row Office Visit from 01/07/2023 in [facility], [facility] Office Visit from 01/01/2022 in [facility], [facility] Clinical Support from 12/25/2020 in [facility], [facility] Clinical Support from 12/19/2019 in [facility], [facility] Office Visit from 10/13/2017 in [facility], [facility]  Total Score (max 30 points ) 29 29 29 30 27       PHQ2-9    Flowsheet Row Office Visit from 03/09/2023 in [facility], [facility] Office Visit from 02/24/2023 in [facility] Office Visit from 01/07/2023 in [place] Office Visit from 04/21/2022 in [facility] Video Visit from 01/20/2022 in [facility]  PHQ-2 Total Score 0 0 3 0 0  PHQ-9 Total Score -- 5 7 0 --  Flowsheet Row Video Visit from 06/03/2023 in [facility] ED from 05/07/2023 in [facility] Admission (Discharged) from 04/29/2023 in [facility]  C-SSRS RISK CATEGORY No Risk No Risk No Risk        Assessment and Plan: LURETTA EVERLY is a 38 year old Caucasian female with history of MDD, PTSD, insomnia,  currently sick with flulike symptoms, presented for a follow-up appointment by telemedicine.  Discussed assessment and plan as noted below.  Depression-improving Depression managed with Wellbutrin 150 mg and Fluoxetine. No current symptoms reported. - Continue Wellbutrin XL 150 mg daily - Continue Fluoxetine 10 mg daily - Continue psychotherapy with Ms.Kristina  Post-Traumatic Stress Disorder (PTSD)-improving PTSD with no new symptoms or changes discussed. - Continue CBT - Continue Fluoxetine 10 mg daily  Hypersomnia-improving Excessive sleepiness improved with increased Wellbutrin dosage. No new changes reported. - Continue current dosage of Wellbutrin  Intellectual disability likely mild-continue to make use of community resources including community living coach.  Follow-up - Schedule follow-up appointment in two months via video visit on April 28th at 8:30 AM.   Collaboration of Care: Collaboration of Care: Primary Care Provider AEB patient encouraged to follow up with primary care provider for flulike symptoms and GI problems and Referral or follow-up with counselor/therapist AEB patient encouraged to continue CBT with Ms. Julien Girt  Patient/Guardian was advised Release of Information must be obtained prior to any record release in order to collaborate their care with an outside provider. Patient/Guardian was advised if they have not already done so to contact the registration department to sign all necessary forms in order for [place] to release information regarding their care.   Consent: Patient/Guardian gives verbal consent for treatment and assignment of benefits for services provided during this visit. Patient/Guardian expressed understanding and agreed to proceed.  Discussed the use of a AI scribe software for clinical note transcription with the patient, who gave verbal consent to proceed.  This note was generated in part or whole with voice recognition software. Voice recognition  is usually quite accurate but there are transcription errors that can and very often do occur. I apologize for any typographical errors that were not detected and corrected.    Teresa Longs, MD 06/03/2023, 11:46 AM

## 2023-06-04 ENCOUNTER — Encounter
Admission: RE | Admit: 2023-06-04 | Discharge: 2023-06-04 | Disposition: A | Discharge status: Home / Self Care | Payer: Medicare Other | Source: Ambulatory Visit | Attending: Nurse Practitioner | Admitting: Nurse Practitioner

## 2023-06-04 DIAGNOSIS — K30 Functional dyspepsia: Secondary | ICD-10-CM | POA: Diagnosis not present

## 2023-06-04 DIAGNOSIS — R1312 Dysphagia, oropharyngeal phase: Secondary | ICD-10-CM | POA: Insufficient documentation

## 2023-06-04 DIAGNOSIS — R6881 Early satiety: Secondary | ICD-10-CM | POA: Insufficient documentation

## 2023-06-04 DIAGNOSIS — R1114 Bilious vomiting: Secondary | ICD-10-CM | POA: Insufficient documentation

## 2023-06-04 MED ORDER — TECHNETIUM TC 99M SULFUR COLLOID
2.3300 | Freq: Once | INTRAVENOUS | Status: AC | PRN
  Administered 2023-06-04: 2.33 via ORAL

## 2023-06-07 ENCOUNTER — Encounter: Payer: Medicare Other | Admitting: Obstetrics

## 2023-06-11 ENCOUNTER — Ambulatory Visit: Payer: Medicare Other | Admitting: Licensed Clinical Social Worker

## 2023-06-11 DIAGNOSIS — G4710 Hypersomnia, unspecified: Secondary | ICD-10-CM | POA: Diagnosis not present

## 2023-06-11 DIAGNOSIS — F330 Major depressive disorder, recurrent, mild: Secondary | ICD-10-CM | POA: Diagnosis not present

## 2023-06-11 DIAGNOSIS — F79 Unspecified intellectual disabilities: Secondary | ICD-10-CM | POA: Diagnosis not present

## 2023-06-11 DIAGNOSIS — F4310 Post-traumatic stress disorder, unspecified: Secondary | ICD-10-CM
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
## 2023-06-22 ENCOUNTER — Encounter: Payer: Self-pay | Admitting: Nurse Practitioner

## 2023-06-22 ENCOUNTER — Ambulatory Visit (INDEPENDENT_AMBULATORY_CARE_PROVIDER_SITE_OTHER): Payer: Medicare Other | Admitting: Nurse Practitioner

## 2023-06-22 VITALS — BP 120/84 | HR 87 | Temp 98.2°F | Resp 16 | Ht 63.0 in | Wt 205.8 lb

## 2023-06-22 DIAGNOSIS — R6881 Early satiety: Secondary | ICD-10-CM | POA: Diagnosis not present

## 2023-06-22 DIAGNOSIS — R1312 Dysphagia, oropharyngeal phase: Secondary | ICD-10-CM

## 2023-06-22 DIAGNOSIS — R198 Other specified symptoms and signs involving the digestive system and abdomen: Secondary | ICD-10-CM | POA: Diagnosis not present

## 2023-06-22 DIAGNOSIS — R1114 Bilious vomiting: Secondary | ICD-10-CM | POA: Diagnosis not present

## 2023-06-22 DIAGNOSIS — K30 Functional dyspepsia: Secondary | ICD-10-CM | POA: Diagnosis not present

## 2023-06-22 DIAGNOSIS — K219 Gastro-esophageal reflux disease without esophagitis: Secondary | ICD-10-CM | POA: Diagnosis not present

## 2023-06-22 MED ORDER — VOQUEZNA 10 MG PO TABS: 10.0000 | ORAL_TABLET | Freq: Every day | ORAL | Status: DC

## 2023-06-22 NOTE — Patient Instructions (Signed)
 Please call [facility] to make an appointment to get further evaluation.   Stop omeprazole.  Start voquezna 10 mg once daily and we will follow up in 1 month to reassess your symptoms.   May continue zofran for nausea and lomotil as needed for diarrhea.

## 2023-06-22 NOTE — Progress Notes (Signed)
 [facility] [address] [place], [place] [address]  Internal MEDICINE  Office Visit Note  Patient Name: Teresa Crawford  [id number]  [id number]  Date of Service: 06/22/2023  Chief Complaint  Patient presents with   Depression   Follow-up    HPI Palmina presents for a follow-up visit for GERD, diarrhea, nausea and vomiting.  Minimally delayed gastric emptying at 4 hours.  Diarrhea has resolved and she is now experiencing constipation. Last BM was 2 days ago. Still experiencing persistent nausea and vomiting, and abdominal pain/cramping and persistent bloating.     Current Medication: Outpatient Encounter Medications as of 06/22/2023  Medication Sig   benzonatate  (TESSALON ) 200 MG capsule Take 200 mg by mouth 2 (two) times daily as needed for cough.   buPROPion  (WELLBUTRIN  XL) 150 MG 24 hr tablet Take 1 tablet (150 mg total) by mouth in the morning. Stop Bupropion  75 mg   cetirizine  (ZYRTEC  ALLERGY) 10 MG tablet Take 1 tablet (10 mg total) by mouth daily.   COMBIVENT  RESPIMAT 20-100 MCG/ACT AERS respimat Inhale 1 puff into the lungs 4 (four) times daily.   cyanocobalamin  (VITAMIN B12) 1000 MCG tablet Take 1 tablet (1,000 mcg total) by mouth daily.   diphenhydrAMINE  (BENADRYL ) 25 mg capsule Take 25 mg by mouth every 6 (six) hours as needed for allergies.   diphenoxylate -atropine  (LOMOTIL ) 2.5-0.025 MG tablet Take 1 tablet by mouth 4 (four) times daily as needed for diarrhea or loose stools.   docusate sodium  (COLACE) 100 MG capsule TAKE 1 CAPSULE BY MOUTH ONCE DAILY FOR STOOL SOFTENER   EPINEPHrine  0.3 mg/0.3 mL IJ SOAJ injection USE FOR ANAPHYLAXIS   loperamide (IMODIUM) 2 MG capsule    methocarbamol (ROBAXIN) 500 MG tablet Take 1 tablet by mouth as needed for back spasms per Ortho.   ondansetron  (ZOFRAN -ODT) 4 MG disintegrating tablet Take 1 tablet (4 mg total) by mouth every 8 (eight) hours as needed for nausea or vomiting.   promethazine  (PHENERGAN ) 12.5 MG tablet  Take 1 tablet (12.5 mg total) by mouth every 8 (eight) hours as needed for nausea or vomiting.   sucralfate  (CARAFATE ) 1 GM/10ML suspension Take 10 mLs (1 g total) by mouth 4 (four) times daily.   topiramate  (TOPAMAX ) 50 MG tablet Take 1 tablet (50 mg total) by mouth every evening.   traMADol  (ULTRAM ) 50 MG tablet ONE TAB PO BID PRN FOR PAIN AND HEADCAHES   triamcinolone  cream (KENALOG ) 0.1 % Apply 1 Application topically 2 (two) times daily as needed (itching and rash).   Vitamin D , Ergocalciferol , (DRISDOL ) 1.25 MG (50000 UNIT) CAPS capsule TAKE 1 CAPSULE BY MOUTH EVERY 7 DAYS   Vonoprazan Fumarate  (VOQUEZNA ) 10 MG TABS Take 10 tablets by mouth daily.   [DISCONTINUED] omeprazole  (PRILOSEC) 40 MG capsule Take 1 capsule (40 mg total) by mouth 2 (two) times daily.   No facility-administered encounter medications on file as of 06/22/2023.    Surgical History: Past Surgical History:  Procedure Laterality Date   ABDOMINAL HYSTERECTOMY     BACK SURGERY     BIOPSY  04/29/2023   Procedure: BIOPSY;  Surgeon: Selena Daily, MD;  Location: [facility];  Service: Gastroenterology;;   COLONOSCOPY  04/27/2006   Wohl-colitis, internal hemorrhoids   COLONOSCOPY  02/2013   focal right colitis, left colon biopsy negative, focal active proctitis without chronicity   COLONOSCOPY WITH PROPOFOL  N/A 07/16/2022   Procedure: COLONOSCOPY WITH PROPOFOL ;  Surgeon: Selena Daily, MD;  Location: [facility];  Service: Gastroenterology;  Laterality: N/A;   ESOPHAGOGASTRODUODENOSCOPY  02/2013   multiple peptic & duodenal ulcers, negative h pylori   ESOPHAGOGASTRODUODENOSCOPY (EGD) WITH PROPOFOL  N/A 04/29/2023   Procedure: ESOPHAGOGASTRODUODENOSCOPY (EGD) WITH PROPOFOL ;  Surgeon: Selena Daily, MD;  Location: [facility];  Service: Gastroenterology;  Laterality: N/A;   INTRAUTERINE DEVICE INSERTION  2012   LAPAROSCOPIC ENDOMETRIOSIS FULGURATION  2015   [name]   OVARIAN CYST REMOVAL       Medical History: Past Medical History:  Diagnosis Date   B12 deficiency anemia    Chronic back pain    Chronic fatigue syndrome    Constipation    Depression    Elevated liver enzymes    Endometriosis    Fibroids    Hepatic steatosis    Hypothyroid    Last menstrual period (LMP) > 10 days ago 09/12/15   Mental retardation    Migraine    Post traumatic stress disorder    Posttraumatic stress disorder    PUD (peptic ulcer disease)    Scoliosis    Splenomegaly    Uterine fibroid     Family History: Family History  Problem Relation Age of Onset   Breast cancer Mother 28   COPD Father    Lung cancer Father 11   Dementia Father    Diabetes Sister    Diabetes Brother    Ovarian cancer Paternal Aunt    Colon cancer Maternal Grandfather 53   Liver disease Maternal Aunt        ?etiology   Lymphoma Maternal Grandmother 59   Lung cancer Other 68       maternal great grandfather   Stomach cancer Other 53       maternal great grandmother    Social History   Socioeconomic History   Marital status: Single    Spouse name: Not on file   Number of children: 0   Years of education: Not on file   Highest education level: Not on file  Occupational History   Not on file  Tobacco Use   Smoking status: Never   Smokeless tobacco: Never  Vaping Use   Vaping status: Never Used  Substance and Sexual Activity   Alcohol use: Not Currently    Alcohol/week: 0.0 standard drinks of alcohol   Drug use: No   Sexual activity: Not Currently  Other Topics Concern   Not on file  Social History Narrative   She has brother, half brother & sister live nearby   Both parents deceased   Care managed by Merrily A[facility]   Social Drivers of Health   Financial Resource Strain: Low Risk  (02/11/2017)   Overall Financial Resource Strain (CARDIA)    Difficulty of Paying Living Expenses: Not hard at all  Food Insecurity: No Food Insecurity (02/11/2017)   Hunger Vital Sign    Worried About  Running Out of Food in the Last Year: Never true    Ran Out of Food in the Last Year: Never true  Transportation Needs: No Transportation Needs (02/11/2017)   PRAPARE - Administrator, Civil Service (Medical): No    Lack of Transportation (Non-Medical): No  Physical Activity: Inactive (02/11/2017)   Exercise Vital Sign    Days of Exercise per Week: 0 days    Minutes of Exercise per Session: 0 min  Stress: No Stress Concern Present (02/11/2017)   [employer] of Occupational Health - Occupational Stress Questionnaire    Feeling of Stress : Not at all  Social Connections:  Unknown (02/11/2017)   Social Connection and Isolation Panel [NHANES]    Frequency of Communication with Friends and Family: Patient declined    Frequency of Social Gatherings with Friends and Family: Patient declined    Attends Religious Services: Patient declined    Database administrator or Organizations: Patient declined    Attends Banker Meetings: Patient declined    Marital Status: Patient declined  Intimate Partner Violence: Unknown (02/11/2017)   Humiliation, Afraid, Rape, and Kick questionnaire    Fear of Current or Ex-Partner: Patient declined    Emotionally Abused: Patient declined    Physically Abused: Patient declined    Sexually Abused: Patient declined      Review of Systems  Constitutional:  Negative for fatigue and fever.  HENT:  Negative for congestion, mouth sores and postnasal drip.   Respiratory:  Positive for cough. Negative for chest tightness, shortness of breath and wheezing.   Cardiovascular:  Negative for chest pain and palpitations.  Gastrointestinal:  Positive for abdominal distention, abdominal pain, diarrhea, nausea and vomiting.       Bloating, early satiety  Genitourinary:  Negative for flank pain.  Psychiatric/Behavioral: Negative.      Vital Signs: BP 120/84   Pulse 87   Temp 98.2 F (36.8 C)   Resp 16   Ht 5\' 3"  (1.6 m)   Wt 205 lb 12.8 oz  (93.4 kg)   LMP 12/06/2015   SpO2 98%   BMI 36.46 kg/m    Physical Exam Vitals reviewed.  Constitutional:      General: She is not in acute distress.    Appearance: Normal appearance. She is obese. She is not ill-appearing.  HENT:     Head: Normocephalic and atraumatic.     Nose: No congestion or rhinorrhea.     Mouth/Throat:     Mouth: Mucous membranes are moist.     Pharynx: Oropharynx is clear. No oropharyngeal exudate or posterior oropharyngeal erythema.  Eyes:     Pupils: Pupils are equal, round, and reactive to light.  Cardiovascular:     Rate and Rhythm: Normal rate and regular rhythm.     Heart sounds: Normal heart sounds. No murmur heard. Pulmonary:     Effort: Pulmonary effort is normal. No respiratory distress.     Breath sounds: Normal breath sounds.  Abdominal:     General: Bowel sounds are normal. There is distension.     Palpations: Abdomen is soft. There is no mass.     Tenderness: There is abdominal tenderness. There is no guarding.     Hernia: No hernia is present.  Neurological:     Mental Status: She is alert.  Psychiatric:        Mood and Affect: Mood normal.        Behavior: Behavior normal.        Assessment/Plan: 1. Delayed gastric emptying (Primary) Gastric emptying test was unremarkable. Will see if this improves with vocquezna  2. Early satiety Will see if this improves with vocquezna.  3. Oropharyngeal dysphagia Will see if the vocquezna helped improve this. Also may need to be evaluated by GI again.  4. Bilious vomiting with nausea Continue prn zofran  as prescribed.   5. Gastroesophageal reflux disease without esophagitis Samples of vocquezna given to patient. Follow up in 4 weeks to evaluation new medication  6. Alternating constipation and diarrhea Take loperamide as needed.    General Counseling: evynne cloninger understanding of the findings of todays visit and agrees  with plan of treatment. I have discussed any further  diagnostic evaluation that may be needed or ordered today. We also reviewed her medications today. she has been encouraged to call the office with any questions or concerns that should arise related to todays visit.    No orders of the defined types were placed in this encounter.   Meds ordered this encounter  Medications   Vonoprazan Fumarate  (VOQUEZNA ) 10 MG TABS    Sig: Take 10 tablets by mouth daily.    Return in about 4 weeks (around 07/20/2023) for F/U, Iceis Knab PCP voquezna .   Total time spent:30 Minutes Time spent includes review of chart, medications, test results, and follow up plan with the patient.   [place] Controlled Substance Database was reviewed by me.  This patient was seen by Laurence Pons, FNP-C in collaboration with Dr. Verneta Gone as a part of collaborative care agreement.   Kaiyon Hynes R. Bobbi Burow, MSN, FNP-C Internal medicine

## 2023-07-01 ENCOUNTER — Other Ambulatory Visit: Payer: Self-pay | Admitting: Nurse Practitioner

## 2023-07-01 DIAGNOSIS — Z79899 Other long term (current) drug therapy: Secondary | ICD-10-CM

## 2023-07-06 ENCOUNTER — Encounter: Payer: Self-pay | Admitting: Nurse Practitioner

## 2023-07-06 DIAGNOSIS — H838X3 Other specified diseases of inner ear, bilateral: Secondary | ICD-10-CM | POA: Insufficient documentation

## 2023-07-06 DIAGNOSIS — H9209 Otalgia, unspecified ear: Secondary | ICD-10-CM | POA: Diagnosis not present

## 2023-07-06 DIAGNOSIS — H9313 Tinnitus, bilateral: Secondary | ICD-10-CM | POA: Diagnosis not present

## 2023-07-06 DIAGNOSIS — G9331 Postviral fatigue syndrome: Secondary | ICD-10-CM | POA: Insufficient documentation

## 2023-07-06 DIAGNOSIS — R1114 Bilious vomiting: Secondary | ICD-10-CM | POA: Insufficient documentation

## 2023-07-13 ENCOUNTER — Encounter: Admitting: Obstetrics

## 2023-07-13 DIAGNOSIS — R42 Dizziness and giddiness: Secondary | ICD-10-CM | POA: Diagnosis not present

## 2023-07-13 DIAGNOSIS — G43809 Other migraine, not intractable, without status migrainosus: Secondary | ICD-10-CM | POA: Diagnosis not present

## 2023-07-15 ENCOUNTER — Encounter: Admitting: Obstetrics

## 2023-07-15 NOTE — Progress Notes (Deleted)
 GYNECOLOGY: ANNUAL EXAM   Subjective:    PCP: Sallyanne Kuster, NP Teresa Crawford is a 38 y.o. female No obstetric history on file. who presents for annual wellness visit.   Well Woman Visit:  GYN HISTORY:  Patient's last menstrual period was 12/06/2015.     Menstrual History: OB History   No obstetric history on file.     Menarche age: *** Patient's last menstrual period was 12/06/2015.     Periods are every *** days, and last *** days, flow is light / moderate / heavy.  Uses pads / tampons / menstrual cup and changes it every *** hours.  Cramping is mild / moderate / severe.  Cyclic symptoms include: {symptoms; gyn cyclic:13153}.  Intermenstrual bleeding, spotting, or discharge? *** Urinary incontinence? ***  Sexually active: *** Number of sexual partners: *** Gender of sexual Partners: *** Social History   Substance and Sexual Activity  Sexual Activity Not Currently   Contraceptive methods: {PLAN CONTRACEPTION:313102} Dyspareunia? *** STI history: *** STI/HIV testing or immunizations needed? {yes [id number]   Health Maintenance: -Last pap: was normal 12/07/2018 --> Any abnormals: *** -Last mammogram: {mammo results:48038} --> Any abnormals? *** -Last colon cancer screen: 2024 / Type: colonoscopy -Last DEXA scan: *** [facility] / Colon / Cervical cancer: *** -Vaccines:  Immunization History  Administered Date(s) Administered   Influenza Inj Mdck Quad Pf 01/11/2018, 02/14/2019, 12/19/2019, 12/25/2020, 01/01/2022   Influenza, Mdck, Trivalent,PF 6+ MOS(egg free) 01/07/2023   Influenza-Unspecified 12/18/2015, 02/09/2017   Moderna SARS-COV2 Booster Vaccination 02/12/2020, 01/23/2021   Moderna Sars-Covid-2 Vaccination 05/10/2019, 06/08/2019   Tdap 02/05/2023   Last Tdap: utd / Flu: utd / COVID: utd / Gardasil: *** / Shingles (50+): n/a / PCV20:n/a -Hep C screen: utd -Last lipid / glucose screening: ***  > Exercise: {misc; exercise types:16438},  {exercise level:31265} > Dietary Supplements: Folate: {yes/no:20286};  Calcium: {yes/no:20286}}; Vitamin D: {yes/no:20286} > There is no height or weight on file to calculate BMI.  > Recent dental visit {yes no:314532} > Seat Belt Use: {yes no:314532} > Texting and driving? {yes no:314532} > Guns in the house {yes no:314532} > Recreational or other drug use: {Drug Use:32241}   Social History   Tobacco Use   Smoking status: Never   Smokeless tobacco: Never  Substance Use Topics   Alcohol use: Not Currently    Alcohol/week: 0.0 standard drinks of alcohol   Occupation: ***    Lives with: ***    PHQ-2 Score: In last two weeks, how often have you felt: Little interest or pleasure in doing things: {PHQGADfrequency:29690::"Not at all (0)"} Feeling down, depressed or hopeless: {PHQGADfrequency:29690::"Not at all (0)"} Score:   GAD-2 Over the last 2 weeks, how often have you been bothered by the following problems? Feeling nervous, anxious or on edge: {PHQGADfrequency:29690::"Not at all (0)"} Not being able to stop or control worrying: {PHQGADfrequency:29690::"Not at all (0)"}} Score: _________________________________________________________  Current Outpatient Medications  Medication Sig Dispense Refill   benzonatate (TESSALON) 200 MG capsule Take 200 mg by mouth 2 (two) times daily as needed for cough.     buPROPion (WELLBUTRIN XL) 150 MG 24 hr tablet Take 1 tablet (150 mg total) by mouth in the morning. Stop Bupropion 75 mg 30 tablet 1   cetirizine (ZYRTEC ALLERGY) 10 MG tablet Take 1 tablet (10 mg total) by mouth daily. 90 tablet 1   COMBIVENT RESPIMAT 20-100 MCG/ACT AERS respimat Inhale 1 puff into the lungs 4 (four) times daily.     cyanocobalamin (VITAMIN B12)  1000 MCG tablet Take 1 tablet (1,000 mcg total) by mouth daily. 90 tablet 2   diphenhydrAMINE (BENADRYL) 25 mg capsule Take 25 mg by mouth every 6 (six) hours as needed for allergies.     diphenoxylate-atropine (LOMOTIL)  2.5-0.025 MG tablet Take 1 tablet by mouth 4 (four) times daily as needed for diarrhea or loose stools. 30 tablet 3   docusate sodium (COLACE) 100 MG capsule TAKE 1 CAPSULE BY MOUTH ONCE DAILY FOR STOOL SOFTENER 90 capsule 1   EPINEPHrine 0.3 mg/0.3 mL IJ SOAJ injection USE FOR ANAPHYLAXIS 1 each 5   loperamide (IMODIUM) 2 MG capsule      methocarbamol (ROBAXIN) 500 MG tablet Take 1 tablet by mouth as needed for back spasms per Ortho.     ondansetron (ZOFRAN-ODT) 4 MG disintegrating tablet Take 1 tablet (4 mg total) by mouth every 8 (eight) hours as needed for nausea or vomiting. 20 tablet 0   promethazine (PHENERGAN) 12.5 MG tablet Take 1 tablet (12.5 mg total) by mouth every 8 (eight) hours as needed for nausea or vomiting. 45 tablet 0   sucralfate (CARAFATE) 1 GM/10ML suspension Take 10 mLs (1 g total) by mouth 4 (four) times daily. 420 mL 0   topiramate (TOPAMAX) 50 MG tablet Take 1 tablet (50 mg total) by mouth every evening. 30 tablet 5   traMADol (ULTRAM) 50 MG tablet ONE TAB PO BID PRN FOR PAIN AND HEADCAHES 60 tablet 2   triamcinolone cream (KENALOG) 0.1 % Apply 1 Application topically 2 (two) times daily as needed (itching and rash). 453.6 g 1   Vitamin D, Ergocalciferol, (DRISDOL) 1.25 MG (50000 UNIT) CAPS capsule TAKE 1 CAPSULE BY MOUTH EVERY 7 DAYS 12 capsule 1   Vonoprazan Fumarate (VOQUEZNA) 10 MG TABS Take 10 tablets by mouth daily.     No current facility-administered medications for this visit.   Allergies  Allergen Reactions   Other Anaphylaxis    Peanut butter, uses Epi pen   Peanut Butter Flavoring Agent (Non-Screening) Swelling    Throat swells Throat swells   Sertraline Other (See Comments)    Panic attacks   Codeine Nausea And Vomiting    nausea   Paroxetine Other (See Comments)    Panic Attack    Paroxetine Hcl Other (See Comments)    Panic attack Other reaction(s): Other (See Comments) Panic attacks   Prednisone & Diphenhydramine Rash   Venlafaxine Other  (See Comments)    Panic Attack    Zoloft [Sertraline Hcl] Other (See Comments)    Panic attack    Past Medical History:  Diagnosis Date   B12 deficiency anemia    Chronic back pain    Chronic fatigue syndrome    Constipation    Depression    Elevated liver enzymes    Endometriosis    Fibroids    Hepatic steatosis    Hypothyroid    Last menstrual period (LMP) > 10 days ago 09/12/15   Mental retardation    Migraine    Post traumatic stress disorder    Posttraumatic stress disorder    PUD (peptic ulcer disease)    Scoliosis    Splenomegaly    Uterine fibroid    Past Surgical History:  Procedure Laterality Date   ABDOMINAL HYSTERECTOMY     BACK SURGERY     BIOPSY  04/29/2023   Procedure: BIOPSY;  Surgeon: Toney Reil, MD;  Location: [facility] ENDOSCOPY;  Service: Gastroenterology;;   COLONOSCOPY  04/27/2006   Wohl-colitis, internal  hemorrhoids   COLONOSCOPY  02/2013   focal right colitis, left colon biopsy negative, focal active proctitis without chronicity   COLONOSCOPY WITH PROPOFOL N/A 07/16/2022   Procedure: COLONOSCOPY WITH PROPOFOL;  Surgeon: Toney Reil, MD;  Location: [facility] ENDOSCOPY;  Service: Gastroenterology;  Laterality: N/A;   ESOPHAGOGASTRODUODENOSCOPY  02/2013   multiple peptic & duodenal ulcers, negative h pylori   ESOPHAGOGASTRODUODENOSCOPY (EGD) WITH PROPOFOL N/A 04/29/2023   Procedure: ESOPHAGOGASTRODUODENOSCOPY (EGD) WITH PROPOFOL;  Surgeon: Toney Reil, MD;  Location: [facility] ENDOSCOPY;  Service: Gastroenterology;  Laterality: N/A;   INTRAUTERINE DEVICE INSERTION  2012   LAPAROSCOPIC ENDOMETRIOSIS FULGURATION  2015   [name]   OVARIAN CYST REMOVAL      Review Of Systems  Constitutional: Denied constitutional symptoms, night sweats, recent illness, fatigue, fever, insomnia and weight loss.  Eyes: Denied eye symptoms, eye pain, photophobia, vision change and visual disturbance.  Ears/Nose/Throat/Neck: Denied ear, nose, throat or neck  symptoms, hearing loss, nasal discharge, sinus congestion and sore throat.  Cardiovascular: Denied cardiovascular symptoms, arrhythmia, chest pain/pressure, edema, exercise intolerance, orthopnea and palpitations.  Respiratory: Denied pulmonary symptoms, asthma, pleuritic pain, productive sputum, cough, dyspnea and wheezing.  Gastrointestinal: Denied, gastro-esophageal reflux, melena, nausea and vomiting.  Genitourinary:*** Denied genitourinary symptoms including symptomatic vaginal discharge, pelvic relaxation issues, and urinary complaints.  Musculoskeletal: Denied musculoskeletal symptoms, stiffness, swelling, muscle weakness and myalgia.  Dermatologic: Denied dermatology symptoms, rash and scar.  Neurologic: Denied neurology symptoms, dizziness, headache, neck pain and syncope.  Psychiatric: Denied psychiatric symptoms, anxiety and depression.  Endocrine: Denied endocrine symptoms including hot flashes and night sweats.      Objective:    LMP 12/06/2015   Constitutional: Well-developed, well-nourished female in no acute distress Neurological: Alert and oriented to person, place, and time Psychiatric: Mood and affect appropriate Skin: No rashes or lesions Neck: Supple without masses. Trachea is midline.Thyroid is normal size without masses Lymphatics: No cervical, axillary, supraclavicular, or inguinal adenopathy noted Respiratory: Clear to auscultation bilaterally. Good air movement with normal work of breathing. Cardiovascular: Regular rate and rhythm. Extremities grossly normal, nontender with no edema; pulses regular Gastrointestinal: Soft, nontender, nondistended. No masses or hernias appreciated. No hepatosplenomegaly. No fluid wave. No rebound or guarding. Breast Exam: {Exam; breast:13139::"normal appearance, no masses or tenderness"} Genitourinary:         External Genitalia: Normal female genitalia    Vagina: Normal mucosa, no lesions.    Cervix: No lesions, normal size and  consistency; no cervical motion tenderness; non-friable; Pap not***obtained.    Uterus: Normal size and contour; smooth, mobile, NT, {Desc; anteverted/retroverted/midposition:60613}. Adnexae: Non-palpable and non-tender Perineum/Anus: No lesions Rectal: deferred    Assessment/Plan:    Teresa Crawford is a 38 y.o. female No obstetric history on file. with normal well-woman gynecologic exam.  -Screenings:  Pap: done with cotesting today  *** w/rflx today Mammogram: ordered***due *** Colon: ordered colonoscopy***Cologuard -OR- due *** Labs: ***A1C, CMP, HepC, Lipid panel, Vit D, TSH GAD***PHQ-2 = ***, discussed coping techniques; follow up with PCP if worsens or develops concern -Contraception: *** -Vaccines: UTD, Tdap today; pt declines Influenza. Gardasil: series not started, declined today.  -Healthy lifestyle modifications discussed: multivitamin, diet, exercise, sunscreen, tobacco and alcohol use. Emphasized importance of regular physical activity.  -Folate***Calcium and Vit D recommendation reviewed.  -All questions answered to patient's satisfaction.  -RTC 1 yr for annual, sooner prn.   No follow-ups on file.    Julieanne Manson, DO [facility]

## 2023-07-16 ENCOUNTER — Ambulatory Visit: Payer: Medicare Other | Admitting: Licensed Clinical Social Worker

## 2023-07-16 DIAGNOSIS — G4710 Hypersomnia, unspecified: Secondary | ICD-10-CM

## 2023-07-16 DIAGNOSIS — F79 Unspecified intellectual disabilities: Secondary | ICD-10-CM

## 2023-07-16 DIAGNOSIS — F330 Major depressive disorder, recurrent, mild: Secondary | ICD-10-CM

## 2023-07-16 DIAGNOSIS — F4310 Post-traumatic stress disorder, unspecified: Secondary | ICD-10-CM | POA: Diagnosis not present
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
## 2023-07-19 ENCOUNTER — Encounter: Payer: Self-pay | Admitting: Nurse Practitioner

## 2023-07-19 ENCOUNTER — Telehealth: Payer: Self-pay | Admitting: Nurse Practitioner

## 2023-07-19 ENCOUNTER — Ambulatory Visit (INDEPENDENT_AMBULATORY_CARE_PROVIDER_SITE_OTHER): Admitting: Nurse Practitioner

## 2023-07-19 VITALS — BP 130/80 | HR 83 | Temp 98.4°F | Resp 16 | Ht 63.0 in | Wt 201.4 lb

## 2023-07-19 DIAGNOSIS — K760 Fatty (change of) liver, not elsewhere classified: Secondary | ICD-10-CM

## 2023-07-19 DIAGNOSIS — R6881 Early satiety: Secondary | ICD-10-CM

## 2023-07-19 DIAGNOSIS — K30 Functional dyspepsia: Secondary | ICD-10-CM | POA: Diagnosis not present

## 2023-07-19 DIAGNOSIS — R1312 Dysphagia, oropharyngeal phase: Secondary | ICD-10-CM | POA: Diagnosis not present

## 2023-07-19 DIAGNOSIS — G43809 Other migraine, not intractable, without status migrainosus: Secondary | ICD-10-CM

## 2023-07-19 DIAGNOSIS — K219 Gastro-esophageal reflux disease without esophagitis: Secondary | ICD-10-CM | POA: Diagnosis not present

## 2023-07-19 DIAGNOSIS — R1114 Bilious vomiting: Secondary | ICD-10-CM | POA: Diagnosis not present

## 2023-07-19 MED ORDER — VOQUEZNA 10 MG PO TABS
10.0000 | ORAL_TABLET | Freq: Every day | ORAL | 5 refills | Status: DC
Start: 2023-07-19 — End: 2023-11-04

## 2023-07-19 MED ORDER — NURTEC 75 MG PO TBDP: 75.0000 mg | ORAL_TABLET | ORAL | 5 refills | Status: DC

## 2023-07-19 NOTE — Progress Notes (Signed)
 [facility] [address] [place], [place] [address]  Internal MEDICINE  Office Visit Note  Patient Name: Teresa Crawford  [id number]  [id number]  Date of Service: 07/19/2023  Chief Complaint  Patient presents with   Depression   Follow-up    HPI Teresa Crawford presents for a follow-up visit for  Vestibular migraine -- has tried topirmate, tramadol, and ibuprofen. May be contributing to the nausea and vomiting. Reports having migraines almost daily with nausea and vomiting,  GERD -- has been taking voquezna which seems to be helping.  Still having nausea and vomiting and also has an episode of diarrhea with painful abdominal cramping.     Current Medication: Outpatient Encounter Medications as of 07/19/2023  Medication Sig   benzonatate (TESSALON) 200 MG capsule Take 200 mg by mouth 2 (two) times daily as needed for cough.   buPROPion (WELLBUTRIN XL) 150 MG 24 hr tablet Take 1 tablet (150 mg total) by mouth in the morning. Stop Bupropion 75 mg   cetirizine (ZYRTEC ALLERGY) 10 MG tablet Take 1 tablet (10 mg total) by mouth daily.   COMBIVENT RESPIMAT 20-100 MCG/ACT AERS respimat Inhale 1 puff into the lungs 4 (four) times daily.   cyanocobalamin (VITAMIN B12) 1000 MCG tablet Take 1 tablet (1,000 mcg total) by mouth daily.   diphenhydrAMINE (BENADRYL) 25 mg capsule Take 25 mg by mouth every 6 (six) hours as needed for allergies.   diphenoxylate-atropine (LOMOTIL) 2.5-0.025 MG tablet Take 1 tablet by mouth 4 (four) times daily as needed for diarrhea or loose stools.   docusate sodium (COLACE) 100 MG capsule TAKE 1 CAPSULE BY MOUTH ONCE DAILY FOR STOOL SOFTENER   EPINEPHrine 0.3 mg/0.3 mL IJ SOAJ injection USE FOR ANAPHYLAXIS   loperamide (IMODIUM) 2 MG capsule    methocarbamol (ROBAXIN) 500 MG tablet Take 1 tablet by mouth as needed for back spasms per Ortho.   ondansetron (ZOFRAN-ODT) 4 MG disintegrating tablet Take 1 tablet (4 mg total) by mouth every 8 (eight) hours as  needed for nausea or vomiting.   promethazine (PHENERGAN) 12.5 MG tablet Take 1 tablet (12.5 mg total) by mouth every 8 (eight) hours as needed for nausea or vomiting.   sucralfate (CARAFATE) 1 GM/10ML suspension Take 10 mLs (1 g total) by mouth 4 (four) times daily.   topiramate (TOPAMAX) 50 MG tablet Take 1 tablet (50 mg total) by mouth every evening.   traMADol (ULTRAM) 50 MG tablet ONE TAB PO BID PRN FOR PAIN AND HEADCAHES   triamcinolone cream (KENALOG) 0.1 % Apply 1 Application topically 2 (two) times daily as needed (itching and rash).   Vitamin D, Ergocalciferol, (DRISDOL) 1.25 MG (50000 UNIT) CAPS capsule TAKE 1 CAPSULE BY MOUTH EVERY 7 DAYS   Vonoprazan Fumarate (VOQUEZNA) 10 MG TABS Take 10 tablets by mouth daily.   No facility-administered encounter medications on file as of 07/19/2023.    Surgical History: Past Surgical History:  Procedure Laterality Date   ABDOMINAL HYSTERECTOMY     BACK SURGERY     BIOPSY  04/29/2023   Procedure: BIOPSY;  Surgeon: Selena Daily, MD;  Location: [facility] ENDOSCOPY;  Service: Gastroenterology;;   COLONOSCOPY  04/27/2006   Wohl-colitis, internal hemorrhoids   COLONOSCOPY  02/2013   focal right colitis, left colon biopsy negative, focal active proctitis without chronicity   COLONOSCOPY WITH PROPOFOL N/A 07/16/2022   Procedure: COLONOSCOPY WITH PROPOFOL;  Surgeon: Selena Daily, MD;  Location: [facility] ENDOSCOPY;  Service: Gastroenterology;  Laterality: N/A;   ESOPHAGOGASTRODUODENOSCOPY  02/2013   multiple peptic & duodenal ulcers, negative h pylori   ESOPHAGOGASTRODUODENOSCOPY (EGD) WITH PROPOFOL N/A 04/29/2023   Procedure: ESOPHAGOGASTRODUODENOSCOPY (EGD) WITH PROPOFOL;  Surgeon: Selena Daily, MD;  Location: [facility] ENDOSCOPY;  Service: Gastroenterology;  Laterality: N/A;   INTRAUTERINE DEVICE INSERTION  2012   LAPAROSCOPIC ENDOMETRIOSIS FULGURATION  2015   [name]   OVARIAN CYST REMOVAL      Medical History: Past Medical History:   Diagnosis Date   B12 deficiency anemia    Chronic back pain    Chronic fatigue syndrome    Constipation    Depression    Elevated liver enzymes    Endometriosis    Fibroids    Hepatic steatosis    Hypothyroid    Last menstrual period (LMP) > 10 days ago 09/12/15   Mental retardation    Migraine    Post traumatic stress disorder    Posttraumatic stress disorder    PUD (peptic ulcer disease)    Scoliosis    Splenomegaly    Uterine fibroid     Family History: Family History  Problem Relation Age of Onset   Breast cancer Mother 22   COPD Father    Lung cancer Father 72   Dementia Father    Diabetes Sister    Diabetes Brother    Ovarian cancer Paternal Aunt    Colon cancer Maternal Grandfather 57   Liver disease Maternal Aunt        ?etiology   Lymphoma Maternal Grandmother 63   Lung cancer Other 37       maternal great grandfather   Stomach cancer Other 72       maternal great grandmother    Social History   Socioeconomic History   Marital status: Single    Spouse name: Not on file   Number of children: 0   Years of education: Not on file   Highest education level: Not on file  Occupational History   Not on file  Tobacco Use   Smoking status: Never   Smokeless tobacco: Never  Vaping Use   Vaping status: Never Used  Substance and Sexual Activity   Alcohol use: Not Currently    Alcohol/week: 0.0 standard drinks of alcohol   Drug use: No   Sexual activity: Not Currently  Other Topics Concern   Not on file  Social History Narrative   She has brother, half brother & sister live nearby   Both parents deceased   Care managed by Merrily Able   Social Drivers of Health   Financial Resource Strain: Low Risk  (02/11/2017)   Overall Financial Resource Strain (CARDIA)    Difficulty of Paying Living Expenses: Not hard at all  Food Insecurity: No Food Insecurity (02/11/2017)   Hunger Vital Sign    Worried About Running Out of Food in the Last Year: Never true     Ran Out of Food in the Last Year: Never true  Transportation Needs: No Transportation Needs (02/11/2017)   PRAPARE - Administrator, Civil Service (Medical): No    Lack of Transportation (Non-Medical): No  Physical Activity: Inactive (02/11/2017)   Exercise Vital Sign    Days of Exercise per Week: 0 days    Minutes of Exercise per Session: 0 min  Stress: No Stress Concern Present (02/11/2017)   [employer] of Occupational Health - Occupational Stress Questionnaire    Feeling of Stress : Not at all  Social Connections: Unknown (02/11/2017)   Social Connection  and Isolation Panel [NHANES]    Frequency of Communication with Friends and Family: Patient declined    Frequency of Social Gatherings with Friends and Family: Patient declined    Attends Religious Services: Patient declined    Database administrator or Organizations: Patient declined    Attends Banker Meetings: Patient declined    Marital Status: Patient declined  Intimate Partner Violence: Unknown (02/11/2017)   Humiliation, Afraid, Rape, and Kick questionnaire    Fear of Current or Ex-Partner: Patient declined    Emotionally Abused: Patient declined    Physically Abused: Patient declined    Sexually Abused: Patient declined      Review of Systems  Vital Signs: BP 130/80   Pulse 83   Temp 98.4 F (36.9 C)   Resp 16   Ht 5\' 3"  (1.6 m)   Wt 201 lb 6.4 oz (91.4 kg)   LMP 12/06/2015   SpO2 99%   BMI 35.68 kg/m    Physical Exam     Assessment/Plan:   General Counseling: Teresa Crawford verbalizes understanding of the findings of todays visit and agrees with plan of treatment. I have discussed any further diagnostic evaluation that may be needed or ordered today. We also reviewed her medications today. she has been encouraged to call the office with any questions or concerns that should arise related to todays visit.    No orders of the defined types were placed in this encounter.   No  orders of the defined types were placed in this encounter.   Return in about 4 weeks (around 08/16/2023) for F/U, eval new med, Teresa Crawford PCP.   Total time spent:*** Minutes Time spent includes review of chart, medications, test results, and follow up plan with the patient.    Controlled Substance Database was reviewed by me.  This patient was seen by Laurence Pons, FNP-C in collaboration with Dr. Verneta Gone as a part of collaborative care agreement.   Zyrion Coey R. Bobbi Burow, MSN, FNP-C Internal medicine

## 2023-07-19 NOTE — Telephone Encounter (Signed)
 Awaiting 07/19/23 office notes for Neurology referral-Toni

## 2023-07-20 ENCOUNTER — Ambulatory Visit: Admitting: Nurse Practitioner

## 2023-07-21 ENCOUNTER — Telehealth: Payer: Self-pay | Admitting: Nurse Practitioner

## 2023-07-21 ENCOUNTER — Encounter: Payer: Self-pay | Admitting: Nurse Practitioner

## 2023-07-21 NOTE — Telephone Encounter (Signed)
 Neurology referral sent via [facility] to [facility].  Lvm notifying patient. Gave pt telephone# [phone]-Teresa Crawford

## 2023-07-26 ENCOUNTER — Telehealth: Payer: Self-pay | Admitting: Nurse Practitioner

## 2023-07-26 ENCOUNTER — Ambulatory Visit (INDEPENDENT_AMBULATORY_CARE_PROVIDER_SITE_OTHER): Admitting: Licensed Clinical Social Worker

## 2023-07-26 DIAGNOSIS — F79 Unspecified intellectual disabilities: Secondary | ICD-10-CM | POA: Diagnosis not present

## 2023-07-26 DIAGNOSIS — F330 Major depressive disorder, recurrent, mild: Secondary | ICD-10-CM

## 2023-07-26 DIAGNOSIS — F4310 Post-traumatic stress disorder, unspecified: Secondary | ICD-10-CM | POA: Diagnosis not present

## 2023-07-26 NOTE — Telephone Encounter (Signed)
 error
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
## 2023-07-31 ENCOUNTER — Encounter: Payer: Self-pay | Admitting: Nurse Practitioner

## 2023-08-02 ENCOUNTER — Encounter: Payer: Self-pay | Admitting: Psychiatry

## 2023-08-02 ENCOUNTER — Telehealth: Payer: Medicare Other | Admitting: Psychiatry

## 2023-08-02 DIAGNOSIS — F79 Unspecified intellectual disabilities: Secondary | ICD-10-CM | POA: Diagnosis not present

## 2023-08-02 DIAGNOSIS — F3342 Major depressive disorder, recurrent, in full remission: Secondary | ICD-10-CM

## 2023-08-02 DIAGNOSIS — F4310 Post-traumatic stress disorder, unspecified: Secondary | ICD-10-CM

## 2023-08-02 MED ORDER — BUPROPION HCL ER (XL) 150 MG PO TB24
150.0000 mg | ORAL_TABLET | Freq: Every morning | ORAL | 5 refills | Status: DC
Start: 2023-08-02 — End: 2023-12-29

## 2023-08-02 MED ORDER — FLUOXETINE HCL 10 MG PO CAPS: 10.0000 mg | ORAL_CAPSULE | Freq: Every morning | ORAL | 5 refills | Status: DC

## 2023-08-02 NOTE — Progress Notes (Signed)
 Virtual Visit via Video Note  I connected with Teresa Crawford on 08/02/23 at  8:30 AM EDT by a video enabled telemedicine application and verified that I am speaking with the correct person using two identifiers.  Location Provider Location : [facility] Patient Location : Home  Participants: Patient , Provider    I discussed the limitations of evaluation and management by telemedicine and the availability of in person appointments. The patient expressed understanding and agreed to proceed.    I discussed the assessment and treatment plan with the patient. The patient was provided an opportunity to ask questions and all were answered. The patient agreed with the plan and demonstrated an understanding of the instructions.   The patient was advised to call back or seek an in-person evaluation if the symptoms worsen or if the condition fails to improve as anticipated.                                                                  BH MD OP Progress Note  08/02/2023 8:49 AM CALANDRA BONUS  MRN:  [id number]  Chief Complaint:  Chief Complaint  Patient presents with   Follow-up   Anxiety   Depression   Medication Refill   Discussed the use of AI scribe software for clinical note transcription with the patient, who gave verbal consent to proceed.  History of Present Illness Teresa Crawford is a 38 year old Caucasian female who is single, lives in [place], has a history of MDD, PTSD, insomnia, bereavement, history of seizure-like spells, likely psychogenic, on SSD, was evaluated by telemedicine today for a follow-up appointment.  She experiences persistent gastrointestinal issues, including daily vomiting after meals. Phenergan  suppositories for nausea are ineffective in preventing vomiting. She is also on vonoprazan for acid reflux. An appointment with her gastroenterologist is scheduled for May 15th.  She suffers from severe headaches and migraines, for which she takes Nurtec every  other day. She is on medication until her neurology appointment in June.  She is currently on Wellbutrin  and fluoxetine  for depression and anxiety, with no changes in mood since the last visit. She attends therapy twice a week, focusing on breathing techniques and past history issues.  Denies thoughts of self-harm or harm to others.  Denies any perceptual disturbances.  She does struggle with her energy level although it is better than before.  She reports sleeping well at night but experiences daytime fatigue, attributing it to her gastrointestinal problems and migraines. She exercises twice a week by walking at the mall or doing exercise videos on YouTube. She has lost a few pounds recently and is drinking a lot of water.    Visit Diagnosis:    ICD-10-CM   1. Recurrent major depressive disorder, in full remission (HCC)  F33.42 buPROPion  (WELLBUTRIN  XL) 150 MG 24 hr tablet    FLUoxetine  (PROZAC ) 10 MG capsule    2. Posttraumatic stress disorder  F43.10 buPROPion  (WELLBUTRIN  XL) 150 MG 24 hr tablet    FLUoxetine  (PROZAC ) 10 MG capsule    3. Intellectual disability  F79    Likely mild      Past Psychiatric History: I have reviewed past psychiatric history from progress note on 08/16/2018.  Past Medical History:  Past Medical History:  Diagnosis Date   B12 deficiency anemia    Chronic back pain    Chronic fatigue syndrome    Constipation    Depression    Elevated liver enzymes    Endometriosis    Fibroids    Hepatic steatosis    Hypothyroid    Last menstrual period (LMP) > 10 days ago 09/12/15   Mental retardation    Migraine    Post traumatic stress disorder    Posttraumatic stress disorder    PUD (peptic ulcer disease)    Scoliosis    Splenomegaly    Uterine fibroid     Past Surgical History:  Procedure Laterality Date   ABDOMINAL HYSTERECTOMY     BACK SURGERY     BIOPSY  04/29/2023   Procedure: BIOPSY;  Surgeon: Selena Daily, MD;  Location: [facility];   Service: Gastroenterology;;   COLONOSCOPY  04/27/2006   Wohl-colitis, internal hemorrhoids   COLONOSCOPY  02/2013   focal right colitis, left colon biopsy negative, focal active proctitis without chronicity   COLONOSCOPY WITH PROPOFOL  N/A 07/16/2022   Procedure: COLONOSCOPY WITH PROPOFOL ;  Surgeon: Selena Daily, MD;  Location: [facility];  Service: Gastroenterology;  Laterality: N/A;   ESOPHAGOGASTRODUODENOSCOPY  02/2013   multiple peptic & duodenal ulcers, negative h pylori   ESOPHAGOGASTRODUODENOSCOPY (EGD) WITH PROPOFOL  N/A 04/29/2023   Procedure: ESOPHAGOGASTRODUODENOSCOPY (EGD) WITH PROPOFOL ;  Surgeon: Selena Daily, MD;  Location: [facility];  Service: Gastroenterology;  Laterality: N/A;   INTRAUTERINE DEVICE INSERTION  2012   LAPAROSCOPIC ENDOMETRIOSIS FULGURATION  2015   [name]   OVARIAN CYST REMOVAL      Family Psychiatric History: I have reviewed family psychiatric history from progress note on 08/16/2018.  Family History:  Family History  Problem Relation Age of Onset   Breast cancer Mother 31   COPD Father    Lung cancer Father 63   Dementia Father    Diabetes Sister    Diabetes Brother    Ovarian cancer Paternal Aunt    Colon cancer Maternal Grandfather 69   Liver disease Maternal Aunt        ?etiology   Lymphoma Maternal Grandmother 34   Lung cancer Other 63       maternal great grandfather   Stomach cancer Other 52       maternal great grandmother    Social History: I have reviewed social history from progress note on 08/16/2018. Social History   Socioeconomic History   Marital status: Single    Spouse name: Not on file   Number of children: 0   Years of education: Not on file   Highest education level: Not on file  Occupational History   Not on file  Tobacco Use   Smoking status: Never   Smokeless tobacco: Never  Vaping Use   Vaping status: Never Used  Substance and Sexual Activity   Alcohol use: Not Currently    Alcohol/week:  0.0 standard drinks of alcohol   Drug use: No   Sexual activity: Not Currently  Other Topics Concern   Not on file  Social History Narrative   She has brother, half brother & sister live nearby   Both parents deceased   Care managed by Merrily Able   Social Drivers of Health   Financial Resource Strain: Low Risk  (02/11/2017)   Overall Financial Resource Strain (CARDIA)    Difficulty of Paying Living Expenses: Not hard at all  Food Insecurity: No Food Insecurity (02/11/2017)   Hunger  Vital Sign    Worried About Programme researcher, broadcasting/film/video in the Last Year: Never true    Ran Out of Food in the Last Year: Never true  Transportation Needs: No Transportation Needs (02/11/2017)   PRAPARE - Administrator, Civil Service (Medical): No    Lack of Transportation (Non-Medical): No  Physical Activity: Inactive (02/11/2017)   Exercise Vital Sign    Days of Exercise per Week: 0 days    Minutes of Exercise per Session: 0 min  Stress: No Stress Concern Present (02/11/2017)   [employer] of Occupational Health - Occupational Stress Questionnaire    Feeling of Stress : Not at all  Social Connections: Unknown (02/11/2017)   Social Connection and Isolation Panel [NHANES]    Frequency of Communication with Friends and Family: Patient declined    Frequency of Social Gatherings with Friends and Family: Patient declined    Attends Religious Services: Patient declined    Database administrator or Organizations: Patient declined    Attends Banker Meetings: Patient declined    Marital Status: Patient declined    Allergies:  Allergies  Allergen Reactions   Other Anaphylaxis    Peanut butter, uses Epi pen   Peanut Butter Flavoring Agent (Non-Screening) Swelling    Throat swells Throat swells   Sertraline  Other (See Comments)    Panic attacks   Codeine  Nausea And Vomiting    nausea   Paroxetine Other (See Comments)    Panic Attack    Paroxetine Hcl Other (See Comments)     Panic attack Other reaction(s): Other (See Comments) Panic attacks   Prednisone  & Diphenhydramine  Rash   Venlafaxine Other (See Comments)    Panic Attack    Zoloft  [Sertraline  Hcl] Other (See Comments)    Panic attack    Metabolic Disorder Labs: Lab Results  Component Value Date   HGBA1C 5.5 12/20/2019   No results found for: "PROLACTIN" Lab Results  Component Value Date   CHOL 155 01/15/2022   TRIG 97 01/15/2022   HDL 34 (L) 01/15/2022   CHOLHDL 4.6 (H) 01/15/2022   LDLCALC 103 (H) 01/15/2022   LDLCALC 99 12/27/2020   Lab Results  Component Value Date   TSH 2.780 05/07/2023   TSH 3.060 12/28/2022    Therapeutic Level Labs: No results found for: "LITHIUM" No results found for: "VALPROATE" No results found for: "CBMZ"  Current Medications: Current Outpatient Medications  Medication Sig Dispense Refill   cetirizine  (ZYRTEC  ALLERGY) 10 MG tablet Take 1 tablet (10 mg total) by mouth daily. 90 tablet 1   COMBIVENT  RESPIMAT 20-100 MCG/ACT AERS respimat Inhale 1 puff into the lungs 4 (four) times daily.     cyanocobalamin  (VITAMIN B12) 1000 MCG tablet Take 1 tablet (1,000 mcg total) by mouth daily. 90 tablet 2   diphenhydrAMINE  (BENADRYL ) 25 mg capsule Take 25 mg by mouth every 6 (six) hours as needed for allergies.     diphenoxylate -atropine  (LOMOTIL ) 2.5-0.025 MG tablet Take 1 tablet by mouth 4 (four) times daily as needed for diarrhea or loose stools. 30 tablet 3   EPINEPHrine  0.3 mg/0.3 mL IJ SOAJ injection USE FOR ANAPHYLAXIS 1 each 5   FLUoxetine  (PROZAC ) 10 MG capsule Take 1 capsule (10 mg total) by mouth in the morning. 30 capsule 5   loperamide (IMODIUM) 2 MG capsule      methocarbamol (ROBAXIN) 500 MG tablet Take 1 tablet by mouth as needed for back spasms per Ortho.  promethazine  (PHENERGAN ) 12.5 MG suppository Place 12.5 mg rectally every 6 (six) hours as needed for nausea or vomiting.     Rimegepant Sulfate (NURTEC) 75 MG TBDP Take 1 tablet (75 mg total) by  mouth every other day. 15 tablet 5   sucralfate  (CARAFATE ) 1 GM/10ML suspension Take 10 mLs (1 g total) by mouth 4 (four) times daily. 420 mL 0   topiramate  (TOPAMAX ) 50 MG tablet Take 1 tablet (50 mg total) by mouth every evening. 30 tablet 5   traMADol  (ULTRAM ) 50 MG tablet ONE TAB PO BID PRN FOR PAIN AND HEADCAHES 60 tablet 2   triamcinolone  cream (KENALOG ) 0.1 % Apply 1 Application topically 2 (two) times daily as needed (itching and rash). 453.6 g 1   Vitamin D , Ergocalciferol , (DRISDOL ) 1.25 MG (50000 UNIT) CAPS capsule TAKE 1 CAPSULE BY MOUTH EVERY 7 DAYS 12 capsule 1   Vonoprazan Fumarate  (VOQUEZNA ) 10 MG TABS Take 10 tablets by mouth daily. 30 tablet 5   benzonatate  (TESSALON ) 200 MG capsule Take 200 mg by mouth 2 (two) times daily as needed for cough. (Patient not taking: Reported on 08/02/2023)     buPROPion  (WELLBUTRIN  XL) 150 MG 24 hr tablet Take 1 tablet (150 mg total) by mouth in the morning. Stop Bupropion  75 mg 30 tablet 5   docusate sodium  (COLACE) 100 MG capsule TAKE 1 CAPSULE BY MOUTH ONCE DAILY FOR STOOL SOFTENER (Patient not taking: Reported on 08/02/2023) 90 capsule 1   ondansetron  (ZOFRAN -ODT) 4 MG disintegrating tablet Take 1 tablet (4 mg total) by mouth every 8 (eight) hours as needed for nausea or vomiting. (Patient not taking: Reported on 08/02/2023) 20 tablet 0   No current facility-administered medications for this visit.     Musculoskeletal: Strength & Muscle Tone:  UTA Gait & Station:  Seated Patient leans: N/A  Psychiatric Specialty Exam: Review of Systems  Constitutional:  Positive for fatigue.  Gastrointestinal:  Positive for nausea and vomiting.  Psychiatric/Behavioral:  The patient is nervous/anxious.     Last menstrual period 12/06/2015.There is no height or weight on file to calculate BMI.  General Appearance: Casual  Eye Contact:  Fair  Speech:  Clear and Coherent  Volume:  Normal  Mood:  Anxious  Affect:  Appropriate  Thought Process:  Goal  Directed and Descriptions of Associations: Intact  Orientation:  Full (Time, Place, and Person)  Thought Content: Logical   Suicidal Thoughts:  No  Homicidal Thoughts:  No  Memory:  Immediate;   Fair Recent;   Fair Remote;   Fair  Judgement:  Fair  Insight:  Fair  Psychomotor Activity:  Normal  Concentration:  Concentration: Fair and Attention Span: Fair  Recall:  [employer] of Knowledge: Fair  Language: Fair  Akathisia:  No  Handed:  Right  AIMS (if indicated): not done  Assets:  Desire for Improvement Housing Social Support Transportation  ADL's:  Intact  Cognition: WNL  Sleep:  Fair   Screenings: AIMS    Flowsheet Row Video Visit from 01/20/2022 in [facility] Video Visit from 10/22/2021 in [facility]  AIMS Total Score 0 0      GAD-7    Flowsheet Row Counselor from 06/11/2023 in [facility] Office Visit from 02/24/2023 in [facility] Office Visit from 04/21/2022 in [facility] Video Visit from 01/20/2022 in [facility] Video Visit from  10/22/2021 in [facility]  Total GAD-7 Score 10 0 0 1 0      Mini-Mental    Flowsheet Row Office Visit from 01/07/2023 in [facility], [facility] Office Visit from 01/01/2022 in [facility], [facility] Clinical Support from 12/25/2020 in [facility], [facility] Clinical Support from 12/19/2019 in [facility], [facility] Office Visit from 10/13/2017 in [facility], [facility]  Total Score (max 30 points ) 29 29 29 30 27       PHQ2-9    Flowsheet Row Counselor from 06/11/2023 in [facility] Office Visit from 03/09/2023 in [facility], [facility] Office Visit from 02/24/2023 in [facility] Office Visit from 01/07/2023 in [facility], [facility] Office Visit from 04/21/2022 in [facility]  PHQ-2 Total Score 4 0 0 3 0  PHQ-9 Total Score 14 -- 5 7 0      Flowsheet Row Video Visit from 08/02/2023 in [facility] Counselor from 06/11/2023 in [facility] Video Visit from 06/03/2023 in [facility]  C-SSRS RISK CATEGORY No Risk No Risk No Risk        Assessment and Plan:Teresa Crawford is a 38 year old Caucasian female with history of MDD, PTSD, insomnia, currently with ongoing GI problems, presented for a follow-up appointment, discussed assessment and plan as noted below.  Assessment & Plan Depression-stable Depression is well-managed with no significant changes. She is actively engaged in therapy, attending sessions twice a week, and utilizing breathing techniques to manage anxiety and stress. No current feelings of depression or anxiety reported. - Continue Wellbutrin  XL 150 mg daily - Continue Fluoxetine  10 mg daily - Continue therapy sessions with Ms. Almetta Jacquet twice a week.  PTSD-improving Currently denies any significant symptoms, managed on current medication regimen as well as is motivated to stay in therapy. - Continue Fluoxetine  10 mg daily - Continue CBT  Intellectual disability likely mild-chronic Patient with good support system, community resources and community living coach. - Continue to make use of community resources.  Chronic nausea and vomiting Chronic nausea and vomiting persist with each meal. Phenergan  suppositories are ineffective. Concerns about medication absorption due to frequent vomiting. Awaiting gastroenterology appointment on May 15 for further evaluation. - Continue Phenergan  suppositories for nausea. - Attend gastroenterology  appointment on May 15 for further evaluation.  Follow-up Follow-up in clinic in 3 months or sooner if needed.   Collaboration of Care: Collaboration of Care: Referral or follow-up with counselor/therapist AEB encouraged to continue CBT has upcoming appointment on May 9 and Other encouraged to continue follow-up with GI provider  Patient/Guardian was advised Release of Information must be obtained prior to any record release in order to collaborate their care with an outside provider. Patient/Guardian was advised if they have not already done so to contact the registration department to sign all necessary forms in order for us  to release information regarding their care.   Consent: Patient/Guardian gives verbal consent for treatment and assignment of benefits for services provided during this visit. Patient/Guardian expressed understanding and agreed to proceed.  This note was generated in part or whole with voice recognition software. Voice recognition is usually quite accurate but there are transcription errors that can and very often do occur. I apologize for any typographical errors that were not detected and corrected.     Rip Hawes, MD 08/02/2023,  10:22 AM

## 2023-08-09 ENCOUNTER — Encounter: Payer: Self-pay | Admitting: Obstetrics

## 2023-08-09 ENCOUNTER — Ambulatory Visit: Admitting: Nurse Practitioner

## 2023-08-09 ENCOUNTER — Ambulatory Visit (INDEPENDENT_AMBULATORY_CARE_PROVIDER_SITE_OTHER): Admitting: Obstetrics

## 2023-08-09 VITALS — BP 113/64 | HR 80 | Ht 63.0 in | Wt 202.0 lb

## 2023-08-09 DIAGNOSIS — R102 Pelvic and perineal pain: Secondary | ICD-10-CM

## 2023-08-09 DIAGNOSIS — Z8742 Personal history of other diseases of the female genital tract: Secondary | ICD-10-CM

## 2023-08-09 DIAGNOSIS — Z803 Family history of malignant neoplasm of breast: Secondary | ICD-10-CM

## 2023-08-09 DIAGNOSIS — Z8041 Family history of malignant neoplasm of ovary: Secondary | ICD-10-CM | POA: Diagnosis not present

## 2023-08-09 MED ORDER — NORGESTIMATE-ETH ESTRADIOL 0.25-35 MG-MCG PO TABS: 1.0000 | ORAL_TABLET | Freq: Every day | ORAL | 0 refills | Status: DC

## 2023-08-09 NOTE — Progress Notes (Signed)
 GYNECOLOGY PROGRESS NOTE  Subjective:  PCP: Laurence Pons, NP  Patient ID: Teresa Crawford, female    DOB: 1986/02/13, 38 y.o.   MRN: [id number]  HPI  Patient is a 38 y.o. G0P0000 female who presents for referral from PCP for hx of endometriosis and [facility] of ovarian cancer. Pt is here today with a life skills aide, is in an independent living facility. She is s/p abd hysterectomy in 2018 for fibroids/AUB, pathology of the right uterosacral lesion mentioned "features suggestive of endometriosis." She has been having  left sided pain abdominal pain for a few months, is sharp and intermittent in nature. CT scan on 05/07/23 was unremarkable. She endorses multiple GI comorbidities, constipation, N/V, GERD, possible IBS, and has had endoscopies and multiple CT exams for various abdominal complaints.   GYN Hx:  LMP: 2018, s/p hysterectomy for fibroids/AUB Last pap: 2018, normal; no hx of abnormal STI Hx: denies Sexually active: no  OB History  Gravida Para Term Preterm AB Living  0 0 0 0 0 0  SAB IAB Ectopic Multiple Live Births  0 0 0 0 0   Past Medical History:  Diagnosis Date   B12 deficiency anemia    Chronic back pain    Chronic fatigue syndrome    Constipation    Depression    Elevated liver enzymes    Endometriosis    Fibroids    Hepatic steatosis    Hypothyroid    Last menstrual period (LMP) > 10 days ago 09/12/15   Mental retardation    Migraine    Post traumatic stress disorder    Posttraumatic stress disorder    PUD (peptic ulcer disease)    Scoliosis    Splenomegaly    Uterine fibroid    Patient Active Problem List   Diagnosis Date Noted   Bilious vomiting with nausea 07/06/2023   Postviral fatigue syndrome 07/06/2023   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/06/2023   Hypersomnia 06/03/2023   Esophageal dysphagia 04/29/2023   MDD (major depressive disorder), recurrent episode, mild (HCC) 04/16/2023   Low back pain 03/17/2023   Spasm of back  muscles 03/17/2023   Erythema of colon 07/16/2022   Encounter for general adult medical examination with abnormal findings 01/10/2020   Needs flu shot 01/10/2020   Allergic rhinitis due to pollen 06/17/2019   Generalized anxiety disorder 06/17/2019   MDD (major depressive disorder), recurrent, in full remission (HCC) 04/13/2019   Routine cervical smear 12/14/2018   MDD (major depressive disorder), recurrent, in partial remission (HCC) 10/06/2018   Insomnia due to mental condition 10/06/2018   Other headache syndrome 08/03/2017   Constipation, unspecified 04/16/2017   Nontoxic single thyroid  nodule 04/16/2017   Migraine without aura, not intractable, without status migrainosus 04/16/2017   Other obesity due to excess calories 04/16/2017   Seizures (HCC) 03/31/2017   Seizure-like activity (HCC) 02/02/2017   Pressure injury of skin 12/30/2016   Scoliosis 12/28/2016   Lower extremity weakness 12/28/2016   Self-inflicted laceration of wrist (HCC) 04/07/2016   MDD (major depressive disorder), recurrent episode, moderate (HCC) 10/29/2014   Posttraumatic stress disorder 10/29/2014   Bereavement 10/29/2014   CFIDS (chronic fatigue and immune dysfunction syndrome) (HCC) 09/28/2014   Aggrieved 09/28/2014   Depression, major, recurrent (HCC) 09/28/2014   Intellectual disability 09/28/2014   Neurosis, posttraumatic 09/28/2014   History of colitis 09/22/2014   Past Surgical History:  Procedure Laterality Date   ABDOMINAL HYSTERECTOMY  06/03/2016   BACK SURGERY  BIOPSY  04/29/2023   Procedure: BIOPSY;  Surgeon: Selena Daily, MD;  Location: [facility] ENDOSCOPY;  Service: Gastroenterology;;   COLONOSCOPY  04/27/2006   Wohl-colitis, internal hemorrhoids   COLONOSCOPY  02/2013   focal right colitis, left colon biopsy negative, focal active proctitis without chronicity   COLONOSCOPY WITH PROPOFOL  N/A 07/16/2022   Procedure: COLONOSCOPY WITH PROPOFOL ;  Surgeon: Selena Daily, MD;   Location: [facility] ENDOSCOPY;  Service: Gastroenterology;  Laterality: N/A;   ESOPHAGOGASTRODUODENOSCOPY  02/2013   multiple peptic & duodenal ulcers, negative h pylori   ESOPHAGOGASTRODUODENOSCOPY (EGD) WITH PROPOFOL  N/A 04/29/2023   Procedure: ESOPHAGOGASTRODUODENOSCOPY (EGD) WITH PROPOFOL ;  Surgeon: Selena Daily, MD;  Location: [facility] ENDOSCOPY;  Service: Gastroenterology;  Laterality: N/A;   INTRAUTERINE DEVICE INSERTION  2012   LAPAROSCOPIC ENDOMETRIOSIS FULGURATION  2015   [name]   OVARIAN CYST REMOVAL     Family History  Problem Relation Age of Onset   Breast cancer Mother 12   COPD Father    Lung cancer Father 71   Dementia Father    Diabetes Sister    Diabetes Brother    Ovarian cancer Paternal Aunt    Colon cancer Maternal Grandfather 3   Liver disease Maternal Aunt        ?etiology   Lymphoma Maternal Grandmother 29   Lung cancer Other 105       maternal great grandfather   Stomach cancer Other 21       maternal great grandmother   Social History   Socioeconomic History   Marital status: Single    Spouse name: Not on file   Number of children: 0   Years of education: Not on file   Highest education level: Not on file  Occupational History   Not on file  Tobacco Use   Smoking status: Never   Smokeless tobacco: Never  Vaping Use   Vaping status: Never Used  Substance and Sexual Activity   Alcohol use: Not Currently    Alcohol/week: 0.0 standard drinks of alcohol   Drug use: No   Sexual activity: Not Currently  Other Topics Concern   Not on file  Social History Narrative   She has brother, half brother & sister live nearby   Both parents deceased   Care managed by Merr[facility]   Social Drivers of Health   Financial Resource Strain: Low Risk  (02/11/2017)   Overall Financial Resource Strain (CARDIA)    Difficulty of Paying Living Expenses: Not hard at all  Food Insecurity: No Food Insecurity (02/11/2017)   Hunger Vital Sign    Worried About Running  Out of Food in the Last Year: Never true    Ran Out of Food in the Last Year: Never true  Transportation Needs: No Transportation Needs (02/11/2017)   PRAPARE - Administrator, Civil Service (Medical): No    Lack of Transportation (Non-Medical): No  Physical Activity: Inactive (02/11/2017)   Exercise Vital Sign    Days of Exercise per Week: 0 days    Minutes of Exercise per Session: 0 min  Stress: No Stress Concern Present (02/11/2017)   [employer] of Occupational Health - Occupational Stress Questionnaire    Feeling of Stress : Not at all  Social Connections: Unknown (02/11/2017)   Social Connection and Isolation Panel [NHANES]    Frequency of Communication with Friends and Family: Patient declined    Frequency of Social Gatherings with Friends and Family: Patient declined    Attends Religious Services:  Patient declined    Active Member of Clubs or Organizations: Patient declined    Attends Banker Meetings: Patient declined    Marital Status: Patient declined  Intimate Partner Violence: Unknown (02/11/2017)   Humiliation, Afraid, Rape, and Kick questionnaire    Fear of Current or Ex-Partner: Patient declined    Emotionally Abused: Patient declined    Physically Abused: Patient declined    Sexually Abused: Patient declined   Current Outpatient Medications on File Prior to Visit  Medication Sig Dispense Refill   albuterol  (VENTOLIN  HFA) 108 (90 Base) MCG/ACT inhaler Inhale into the lungs every 6 (six) hours as needed for wheezing or shortness of breath.     benzonatate  (TESSALON ) 200 MG capsule Take 200 mg by mouth 2 (two) times daily as needed for cough.     buPROPion  (WELLBUTRIN  XL) 150 MG 24 hr tablet Take 1 tablet (150 mg total) by mouth in the morning. Stop Bupropion  75 mg 30 tablet 5   cetirizine  (ZYRTEC  ALLERGY) 10 MG tablet Take 1 tablet (10 mg total) by mouth daily. 90 tablet 1   COMBIVENT  RESPIMAT 20-100 MCG/ACT AERS respimat Inhale 1 puff into  the lungs 4 (four) times daily.     cyanocobalamin  (VITAMIN B12) 1000 MCG tablet Take 1 tablet (1,000 mcg total) by mouth daily. 90 tablet 2   diphenhydrAMINE  (BENADRYL ) 25 mg capsule Take 25 mg by mouth every 6 (six) hours as needed for allergies.     diphenoxylate -atropine  (LOMOTIL ) 2.5-0.025 MG tablet Take 1 tablet by mouth 4 (four) times daily as needed for diarrhea or loose stools. 30 tablet 3   docusate sodium  (COLACE) 100 MG capsule TAKE 1 CAPSULE BY MOUTH ONCE DAILY FOR STOOL SOFTENER 90 capsule 1   EPINEPHrine  0.3 mg/0.3 mL IJ SOAJ injection USE FOR ANAPHYLAXIS 1 each 5   loperamide (IMODIUM) 2 MG capsule      methocarbamol (ROBAXIN) 500 MG tablet Take 1 tablet by mouth as needed for back spasms per Ortho.     ondansetron  (ZOFRAN -ODT) 4 MG disintegrating tablet Take 1 tablet (4 mg total) by mouth every 8 (eight) hours as needed for nausea or vomiting. 20 tablet 0   promethazine  (PHENERGAN ) 12.5 MG suppository Place 12.5 mg rectally every 6 (six) hours as needed for nausea or vomiting.     Rimegepant Sulfate (NURTEC) 75 MG TBDP Take 1 tablet (75 mg total) by mouth every other day. 15 tablet 5   topiramate  (TOPAMAX ) 50 MG tablet Take 1 tablet (50 mg total) by mouth every evening. 30 tablet 5   traMADol  (ULTRAM ) 50 MG tablet ONE TAB PO BID PRN FOR PAIN AND HEADCAHES 60 tablet 2   Vitamin D , Ergocalciferol , (DRISDOL ) 1.25 MG (50000 UNIT) CAPS capsule TAKE 1 CAPSULE BY MOUTH EVERY 7 DAYS 12 capsule 1   FLUoxetine  (PROZAC ) 10 MG capsule Take 1 capsule (10 mg total) by mouth in the morning. 30 capsule 5   sucralfate  (CARAFATE ) 1 GM/10ML suspension Take 10 mLs (1 g total) by mouth 4 (four) times daily. 420 mL 0   triamcinolone  cream (KENALOG ) 0.1 % Apply 1 Application topically 2 (two) times daily as needed (itching and rash). 453.6 g 1   Vonoprazan Fumarate  (VOQUEZNA ) 10 MG TABS Take 10 tablets by mouth daily. 30 tablet 5   No current facility-administered medications on file prior to visit.    Allergies  Allergen Reactions   Other Anaphylaxis    Peanut butter, uses Epi pen   Peanut Butter Flavoring Agent (Non-Screening)  Swelling    Throat swells Throat swells   Sertraline  Other (See Comments)    Panic attacks   Codeine  Nausea And Vomiting    nausea   Paroxetine Other (See Comments)    Panic Attack    Paroxetine Hcl Other (See Comments)    Panic attack Other reaction(s): Other (See Comments) Panic attacks   Prednisone  & Diphenhydramine  Rash   Venlafaxine Other (See Comments)    Panic Attack    Zoloft  [Sertraline  Hcl] Other (See Comments)    Panic attack   Review of Systems Pertinent items are noted in HPI.   Objective:   Blood pressure 113/64, pulse 80, weight 202 lb (91.6 kg), last menstrual period 12/06/2015. Body mass index is 35.78 kg/m.  Physical Exam Vitals and nursing note reviewed. Exam conducted with a chaperone present.  Constitutional:      Appearance: Normal appearance. She is obese.  HENT:     Head: Normocephalic and atraumatic.  Eyes:     Extraocular Movements: Extraocular movements intact.  Cardiovascular:     Rate and Rhythm: Normal rate.  Pulmonary:     Effort: Pulmonary effort is normal.  Abdominal:     General: Abdomen is flat. There is no distension.     Palpations: Abdomen is soft. There is no mass.     Tenderness: There is abdominal tenderness (L abd wall over scar). There is no right CVA tenderness, left CVA tenderness, guarding or rebound.     Hernia: No hernia is present.  Genitourinary:    General: Normal vulva.     Vagina: Normal. No vaginal discharge, tenderness or bleeding.     Uterus: Absent.      Adnexa: Right adnexa normal and left adnexa normal.       Right: No mass, tenderness or fullness.         Left: No mass, tenderness or fullness.    Neurological:     General: No focal deficit present.     Mental Status: She is alert.  Psychiatric:     Comments: Slowed mentation   05/07/23 CT ABD/PELVIS FINDINGS: Lower  chest: No acute findings. Calcified granulomas identified within the right middle lobe and left lower lobe.   Hepatobiliary: No focal liver abnormality is seen. No gallstones, gallbladder wall thickening, or biliary dilatation.   Pancreas: Unremarkable. No pancreatic ductal dilatation or surrounding inflammatory changes.   Spleen: Normal in size without focal abnormality.   Adrenals/Urinary Tract: Normal adrenal glands. 2 mm stone noted in the upper pole of the left kidney. No mass or hydronephrosis identified bilaterally. Urinary bladder appears normal.   Stomach/Bowel: Stomach is within normal limits. The appendix is visualized and appears normal. No bowel wall thickening, inflammation, or distension.   Vascular/Lymphatic: Normal appearance of the abdominal aorta. No signs of abdominopelvic adenopathy.   Reproductive: Uterus is surgically absent. Corpus luteal cyst identified within the right ovary. Left ovary appears normal. No adnexal mass.   Other: Trace free fluid noted within the posterior pelvis, nonspecific. No discrete fluid collection identified. No signs of pneumoperitoneum.   Musculoskeletal: Unchanged, nonaggressive appearing sclerotic lesions within the lumbar spine and bony pelvis these most likely represent multiple bone islands. Postoperative changes from thoracolumbar scoliosis rod placement.   IMPRESSION: 1. No acute findings within the abdomen or pelvis. 2. Nonobstructing left renal calculus. 3. Corpus luteal cyst identified within the right ovary. 4. Trace free fluid noted within the posterior pelvis, nonspecific.   PATHOLOGY 06/03/16 A: Uterus, cervix, bilateral fallopian tubes, laparoscopic  total hysterectomy and bilateral salpingectomy  Uterine cervix - Ectocervix negative for dysplasia or carcinoma  - Endocervix with chronic inflammation    Endometrium  - Inactive appearing endometrium; negative for hyperplasia, carcinoma, endometritis, polyp  or malignancy    Myometrium  - Fragmented leiomyoma(s); infarct type necrosis is present  - No increased mitotic activity, pleomorphism or true tumor cell type necrosis    -Bilateral fallopian tubes with no pathologic diagnosis    B: Right uterosacral lesion, biopsy - Fibroadipose tissue with features suggestive of endometriosis  - No evidence of malignancy    Assessment/Plan:   1. Pelvic pain   2. Hx of endometriosis   3. Family history of breast cancer   4. Family history of ovarian cancer     38 y.o. G0P0000 with PMH suggestive of endometriosis, s/p hysterectomy in 2018 for AUB/fibroids, and extensive GI symptoms and medications, referred here for her hx of endometriosis and L abd pain, and FMH of ovarian cancer in aunts, and breast cancer in her mother. We discussed genetic screening today, and if patient is negative, will not need any extra surveillance for ovarian cancer. We reviewed that even after a hysterectomy, endometriosis can recur and requires suppresive medications until menopause. I am not certain that her L abd discomfort is endometriosis in nature, suspect is more bowel-related and/or adhesions due to it's location over her scar. We will trial COCs for 3 mos and if no relief, is unlikely endometriosis and pt can discontinue. Will need further workup with her PCP for etiology. We will call her with the results of her genetic screening, and she will see us  in clinic in 12 wks.    Teresa Dunn, DO [facility]

## 2023-08-10 ENCOUNTER — Ambulatory Visit (INDEPENDENT_AMBULATORY_CARE_PROVIDER_SITE_OTHER): Admitting: Nurse Practitioner

## 2023-08-10 ENCOUNTER — Encounter: Payer: Self-pay | Admitting: Nurse Practitioner

## 2023-08-10 VITALS — BP 118/84 | HR 75 | Temp 98.3°F | Resp 16 | Ht 63.0 in | Wt 205.4 lb

## 2023-08-10 DIAGNOSIS — R1114 Bilious vomiting: Secondary | ICD-10-CM

## 2023-08-10 DIAGNOSIS — K219 Gastro-esophageal reflux disease without esophagitis: Secondary | ICD-10-CM | POA: Diagnosis not present

## 2023-08-10 DIAGNOSIS — R198 Other specified symptoms and signs involving the digestive system and abdomen: Secondary | ICD-10-CM | POA: Diagnosis not present

## 2023-08-10 DIAGNOSIS — K760 Fatty (change of) liver, not elsewhere classified: Secondary | ICD-10-CM | POA: Diagnosis not present

## 2023-08-10 DIAGNOSIS — G43809 Other migraine, not intractable, without status migrainosus: Secondary | ICD-10-CM

## 2023-08-10 DIAGNOSIS — Z79899 Other long term (current) drug therapy: Secondary | ICD-10-CM

## 2023-08-10 MED ORDER — FLUTICASONE PROPIONATE 50 MCG/ACT NA SUSP: 2.0000 | Freq: Every day | NASAL | 6 refills | Status: DC

## 2023-08-10 MED ORDER — ACETAMINOPHEN 500 MG PO TABS: 500.0000 mg | ORAL_TABLET | Freq: Four times a day (QID) | ORAL | 5 refills | Status: DC | PRN

## 2023-08-10 MED ORDER — LEVOCETIRIZINE DIHYDROCHLORIDE 5 MG PO TABS: 5.0000 mg | ORAL_TABLET | Freq: Every evening | ORAL | 1 refills | Status: DC

## 2023-08-10 MED ORDER — COMBIVENT RESPIMAT 20-100 MCG/ACT IN AERS: 1.0000 | INHALATION_SPRAY | Freq: Four times a day (QID) | RESPIRATORY_TRACT | 5 refills | Status: DC | PRN

## 2023-08-10 MED ORDER — ALBUTEROL SULFATE HFA 108 (90 BASE) MCG/ACT IN AERS: 1.0000 | INHALATION_SPRAY | Freq: Four times a day (QID) | RESPIRATORY_TRACT | 5 refills | Status: DC | PRN

## 2023-08-10 NOTE — Progress Notes (Signed)
 [facility] [address] [place], [place] [address]  Internal MEDICINE  Office Visit Note  Patient Name: Teresa Crawford  [id number]  [id number]  Date of Service: 08/10/2023  Chief Complaint  Patient presents with   Depression   Follow-up    HPI Kenly presents for a follow-up visit for headaches, GERD, refills, migraines.  Headaches, vestibular migraines -- not improved per patient yet. Was started on nurtec previously Start on prozac  but only took for a week in November, does not want to go back on it.   GERD and nausea/vomiting -- somewhat improved. Has upcoming appt to see gastroenterology soon.  Alternating constipation and diarrhea. Due for routine medication refills.    Current Medication: Outpatient Encounter Medications as of 08/10/2023  Medication Sig   benzonatate  (TESSALON ) 200 MG capsule Take 200 mg by mouth 2 (two) times daily as needed for cough.   buPROPion  (WELLBUTRIN  XL) 150 MG 24 hr tablet Take 1 tablet (150 mg total) by mouth in the morning. Stop Bupropion  75 mg   cyanocobalamin  (VITAMIN B12) 1000 MCG tablet Take 1 tablet (1,000 mcg total) by mouth daily.   diphenhydrAMINE  (BENADRYL ) 25 mg capsule Take 25 mg by mouth every 6 (six) hours as needed for allergies.   diphenoxylate -atropine  (LOMOTIL ) 2.5-0.025 MG tablet Take 1 tablet by mouth 4 (four) times daily as needed for diarrhea or loose stools.   docusate sodium  (COLACE) 100 MG capsule TAKE 1 CAPSULE BY MOUTH ONCE DAILY FOR STOOL SOFTENER   EPINEPHrine  0.3 mg/0.3 mL IJ SOAJ injection USE FOR ANAPHYLAXIS   norgestimate -ethinyl estradiol  (ORTHO-CYCLEN) 0.25-35 MG-MCG tablet Take 1 tablet by mouth daily.   ondansetron  (ZOFRAN -ODT) 4 MG disintegrating tablet Take 1 tablet (4 mg total) by mouth every 8 (eight) hours as needed for nausea or vomiting.   promethazine  (PHENERGAN ) 12.5 MG suppository Place 12.5 mg rectally every 6 (six) hours as needed for nausea or vomiting.   Rimegepant Sulfate (NURTEC)  75 MG TBDP Take 1 tablet (75 mg total) by mouth every other day.   sucralfate  (CARAFATE ) 1 GM/10ML suspension Take 10 mLs (1 g total) by mouth 4 (four) times daily.   topiramate  (TOPAMAX ) 50 MG tablet Take 1 tablet (50 mg total) by mouth every evening.   traMADol  (ULTRAM ) 50 MG tablet ONE TAB PO BID PRN FOR PAIN AND HEADCAHES   triamcinolone  cream (KENALOG ) 0.1 % Apply 1 Application topically 2 (two) times daily as needed (itching and rash).   Vitamin D , Ergocalciferol , (DRISDOL ) 1.25 MG (50000 UNIT) CAPS capsule TAKE 1 CAPSULE BY MOUTH EVERY 7 DAYS   Vonoprazan Fumarate  (VOQUEZNA ) 10 MG TABS Take 10 tablets by mouth daily.   [DISCONTINUED] albuterol  (VENTOLIN  HFA) 108 (90 Base) MCG/ACT inhaler Inhale into the lungs every 6 (six) hours as needed for wheezing or shortness of breath.   [DISCONTINUED] cetirizine  (ZYRTEC  ALLERGY) 10 MG tablet Take 1 tablet (10 mg total) by mouth daily.   [DISCONTINUED] COMBIVENT  RESPIMAT 20-100 MCG/ACT AERS respimat Inhale 1 puff into the lungs 4 (four) times daily.   [DISCONTINUED] FLUoxetine  (PROZAC ) 10 MG capsule Take 1 capsule (10 mg total) by mouth in the morning.   [DISCONTINUED] loperamide (IMODIUM) 2 MG capsule    [DISCONTINUED] methocarbamol (ROBAXIN) 500 MG tablet Take 1 tablet by mouth as needed for back spasms per Ortho.   acetaminophen  (TYLENOL ) 500 MG tablet Take 1 tablet (500 mg total) by mouth every 6 (six) hours as needed for mild pain (pain score 1-3) or moderate pain (pain score 4-6).  albuterol  (VENTOLIN  HFA) 108 (90 Base) MCG/ACT inhaler Inhale 1-2 puffs into the lungs every 6 (six) hours as needed for wheezing or shortness of breath.   COMBIVENT  RESPIMAT 20-100 MCG/ACT AERS respimat Inhale 1 puff into the lungs every 6 (six) hours as needed for wheezing or shortness of breath.   fluticasone  (FLONASE ) 50 MCG/ACT nasal spray Place 2 sprays into both nostrils daily.   levocetirizine (XYZAL ) 5 MG tablet Take 1 tablet (5 mg total) by mouth every  evening.   No facility-administered encounter medications on file as of 08/10/2023.    Surgical History: Past Surgical History:  Procedure Laterality Date   ABDOMINAL HYSTERECTOMY  06/03/2016   BACK SURGERY     BIOPSY  04/29/2023   Procedure: BIOPSY;  Surgeon: Selena Daily, MD;  Location: [facility] ENDOSCOPY;  Service: Gastroenterology;;   COLONOSCOPY  04/27/2006   Wohl-colitis, internal hemorrhoids   COLONOSCOPY  02/2013   focal right colitis, left colon biopsy negative, focal active proctitis without chronicity   COLONOSCOPY WITH PROPOFOL  N/A 07/16/2022   Procedure: COLONOSCOPY WITH PROPOFOL ;  Surgeon: Selena Daily, MD;  Location: [facility] ENDOSCOPY;  Service: Gastroenterology;  Laterality: N/A;   ESOPHAGOGASTRODUODENOSCOPY  02/2013   multiple peptic & duodenal ulcers, negative h pylori   ESOPHAGOGASTRODUODENOSCOPY (EGD) WITH PROPOFOL  N/A 04/29/2023   Procedure: ESOPHAGOGASTRODUODENOSCOPY (EGD) WITH PROPOFOL ;  Surgeon: Selena Daily, MD;  Location: [facility] ENDOSCOPY;  Service: Gastroenterology;  Laterality: N/A;   INTRAUTERINE DEVICE INSERTION  2012   LAPAROSCOPIC ENDOMETRIOSIS FULGURATION  2015   [name]   OVARIAN CYST REMOVAL      Medical History: Past Medical History:  Diagnosis Date   B12 deficiency anemia    Chronic back pain    Chronic fatigue syndrome    Constipation    Depression    Elevated liver enzymes    Endometriosis    Fibroids    Hepatic steatosis    Hypothyroid    Last menstrual period (LMP) > 10 days ago 09/12/15   Mental retardation    Migraine    Post traumatic stress disorder    Posttraumatic stress disorder    PUD (peptic ulcer disease)    Scoliosis    Splenomegaly    Uterine fibroid     Family History: Family History  Problem Relation Age of Onset   Breast cancer Mother 74   COPD Father    Lung cancer Father 62   Dementia Father    Diabetes Sister    Diabetes Brother    Ovarian cancer Paternal Aunt    Colon cancer Maternal  Grandfather 21   Liver disease Maternal Aunt        ?etiology   Lymphoma Maternal Grandmother 46   Lung cancer Other 65       maternal great grandfather   Stomach cancer Other 21       maternal great grandmother    Social History   Socioeconomic History   Marital status: Single    Spouse name: Not on file   Number of children: 0   Years of education: Not on file   Highest education level: Not on file  Occupational History   Not on file  Tobacco Use   Smoking status: Never   Smokeless tobacco: Never  Vaping Use   Vaping status: Never Used  Substance and Sexual Activity   Alcohol use: Not Currently    Alcohol/week: 0.0 standard drinks of alcohol   Drug use: No   Sexual activity: Not Currently  Other  Topics Concern   Not on file  Social History Narrative   She has brother, half brother & sister live nearby   Both parents deceased   Care managed by Merrily Able   Social Drivers of Health   Financial Resource Strain: Low Risk  (02/11/2017)   Overall Financial Resource Strain (CARDIA)    Difficulty of Paying Living Expenses: Not hard at all  Food Insecurity: No Food Insecurity (02/11/2017)   Hunger Vital Sign    Worried About Running Out of Food in the Last Year: Never true    Ran Out of Food in the Last Year: Never true  Transportation Needs: No Transportation Needs (02/11/2017)   PRAPARE - Administrator, Civil Service (Medical): No    Lack of Transportation (Non-Medical): No  Physical Activity: Inactive (02/11/2017)   Exercise Vital Sign    Days of Exercise per Week: 0 days    Minutes of Exercise per Session: 0 min  Stress: No Stress Concern Present (02/11/2017)   [employer] of Occupational Health - Occupational Stress Questionnaire    Feeling of Stress : Not at all  Social Connections: Unknown (02/11/2017)   Social Connection and Isolation Panel [NHANES]    Frequency of Communication with Friends and Family: Patient declined    Frequency of Social  Gatherings with Friends and Family: Patient declined    Attends Religious Services: Patient declined    Database administrator or Organizations: Patient declined    Attends Banker Meetings: Patient declined    Marital Status: Patient declined  Intimate Partner Violence: Unknown (02/11/2017)   Humiliation, Afraid, Rape, and Kick questionnaire    Fear of Current or Ex-Partner: Patient declined    Emotionally Abused: Patient declined    Physically Abused: Patient declined    Sexually Abused: Patient declined      Review of Systems  Constitutional:  Negative for fatigue and fever.  HENT:  Negative for congestion, mouth sores and postnasal drip.   Respiratory:  Positive for cough. Negative for chest tightness, shortness of breath and wheezing.   Cardiovascular:  Negative for chest pain and palpitations.  Gastrointestinal:  Positive for abdominal distention, abdominal pain, diarrhea, nausea and vomiting.       Bloating, early satiety  Genitourinary:  Negative for flank pain.  Psychiatric/Behavioral: Negative.      Vital Signs: BP 118/84   Pulse 75   Temp 98.3 F (36.8 C)   Resp 16   Ht 5\' 3"  (1.6 m)   Wt 205 lb 6.4 oz (93.2 kg)   LMP 12/06/2015   SpO2 99%   BMI 36.38 kg/m    Physical Exam Vitals reviewed.  Constitutional:      General: She is not in acute distress.    Appearance: Normal appearance. She is obese. She is not ill-appearing.  HENT:     Head: Normocephalic and atraumatic.  Eyes:     Pupils: Pupils are equal, round, and reactive to light.  Cardiovascular:     Rate and Rhythm: Normal rate and regular rhythm.  Pulmonary:     Effort: Pulmonary effort is normal. No respiratory distress.  Neurological:     Mental Status: She is alert and oriented to person, place, and time.  Psychiatric:        Mood and Affect: Mood normal.        Behavior: Behavior normal.        Assessment/Plan: 1. Vestibular migraine (Primary) Continue nurtec as  prescribed. Will reevaluate  effectiveness at next office visit.   2. Gastroesophageal reflux disease without esophagitis Continue medication as prescribed.   3. Bilious vomiting with nausea Continue prn zofran  or promethazine  for nausea and vomiting  4. NAFLD (nonalcoholic fatty liver disease) Upcoming appt with GI  5. Alternating constipation and diarrhea Has upcoming appt with gastroenterology  6. Encounter for medication review Medication list reviewed, updated and refills ordered.  - acetaminophen  (TYLENOL ) 500 MG tablet; Take 1 tablet (500 mg total) by mouth every 6 (six) hours as needed for mild pain (pain score 1-3) or moderate pain (pain score 4-6).  Dispense: 30 tablet; Refill: 5 - COMBIVENT  RESPIMAT 20-100 MCG/ACT AERS respimat; Inhale 1 puff into the lungs every 6 (six) hours as needed for wheezing or shortness of breath.  Dispense: 4 g; Refill: 5 - albuterol  (VENTOLIN  HFA) 108 (90 Base) MCG/ACT inhaler; Inhale 1-2 puffs into the lungs every 6 (six) hours as needed for wheezing or shortness of breath.  Dispense: 8 g; Refill: 5 - levocetirizine (XYZAL ) 5 MG tablet; Take 1 tablet (5 mg total) by mouth every evening.  Dispense: 90 tablet; Refill: 1 - fluticasone  (FLONASE ) 50 MCG/ACT nasal spray; Place 2 sprays into both nostrils daily.  Dispense: 16 g; Refill: 6   General Counseling: Zoii verbalizes understanding of the findings of todays visit and agrees with plan of treatment. I have discussed any further diagnostic evaluation that may be needed or ordered today. We also reviewed her medications today. she has been encouraged to call the office with any questions or concerns that should arise related to todays visit.    No orders of the defined types were placed in this encounter.   Meds ordered this encounter  Medications   acetaminophen  (TYLENOL ) 500 MG tablet    Sig: Take 1 tablet (500 mg total) by mouth every 6 (six) hours as needed for mild pain (pain score 1-3) or  moderate pain (pain score 4-6).    Dispense:  30 tablet    Refill:  5    For future refills, keep on file.   COMBIVENT  RESPIMAT 20-100 MCG/ACT AERS respimat    Sig: Inhale 1 puff into the lungs every 6 (six) hours as needed for wheezing or shortness of breath.    Dispense:  4 g    Refill:  5    For future refill, keep on file   albuterol  (VENTOLIN  HFA) 108 (90 Base) MCG/ACT inhaler    Sig: Inhale 1-2 puffs into the lungs every 6 (six) hours as needed for wheezing or shortness of breath.    Dispense:  8 g    Refill:  5   levocetirizine (XYZAL ) 5 MG tablet    Sig: Take 1 tablet (5 mg total) by mouth every evening.    Dispense:  90 tablet    Refill:  1   fluticasone  (FLONASE ) 50 MCG/ACT nasal spray    Sig: Place 2 sprays into both nostrils daily.    Dispense:  16 g    Refill:  6    Return in about 3 months (around 11/10/2023) for F/U, Zhaire Locker PCP.   Total time spent:30 Minutes Time spent includes review of chart, medications, test results, and follow up plan with the patient.   [place] Controlled Substance Database was reviewed by me.  This patient was seen by Laurence Pons, FNP-C in collaboration with Dr. Verneta Gone as a part of collaborative care agreement.   Zora Glendenning R. Bobbi Burow, MSN, FNP-C Internal medicine

## 2023-08-12 ENCOUNTER — Other Ambulatory Visit: Payer: Self-pay

## 2023-08-13 ENCOUNTER — Ambulatory Visit (INDEPENDENT_AMBULATORY_CARE_PROVIDER_SITE_OTHER): Admitting: Licensed Clinical Social Worker

## 2023-08-13 DIAGNOSIS — F4310 Post-traumatic stress disorder, unspecified: Secondary | ICD-10-CM | POA: Diagnosis not present

## 2023-08-13 DIAGNOSIS — F330 Major depressive disorder, recurrent, mild: Secondary | ICD-10-CM
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
## 2023-08-19 ENCOUNTER — Ambulatory Visit (INDEPENDENT_AMBULATORY_CARE_PROVIDER_SITE_OTHER): Admitting: Gastroenterology

## 2023-08-19 ENCOUNTER — Encounter: Payer: Self-pay | Admitting: Gastroenterology

## 2023-08-19 VITALS — BP 148/78 | HR 84 | Temp 98.3°F | Wt 200.0 lb

## 2023-08-19 DIAGNOSIS — K5900 Constipation, unspecified: Secondary | ICD-10-CM

## 2023-08-19 DIAGNOSIS — R1010 Upper abdominal pain, unspecified: Secondary | ICD-10-CM

## 2023-08-19 DIAGNOSIS — K5909 Other constipation: Secondary | ICD-10-CM

## 2023-08-19 DIAGNOSIS — R112 Nausea with vomiting, unspecified: Secondary | ICD-10-CM

## 2023-08-19 MED ORDER — LINACLOTIDE 145 MCG PO CAPS: 145.0000 ug | ORAL_CAPSULE | Freq: Every day | ORAL | Status: DC

## 2023-08-19 NOTE — Progress Notes (Signed)
 Teresa Oz, MD [address]  [address]  [place], [place] [address]  Main: [phone]  Fax: [phone]    Gastroenterology Consultation  Referring Provider:     Laurence Pons, NP Primary Care Physician:  Laurence Pons, NP Primary Gastroenterologist:  Dr. Karma Crawford Reason for Consultation: Upper abdominal pain, severe constipation, nausea, vomiting        HPI:   Teresa Crawford is a 38 y.o. female referred by Laurence Pons, NP  for consultation & management of left lower quadrant pain and diarrhea.  Patient went to ER on 05/15/2022 with abdominal pain, predominantly in the left lower quadrant area associated with loose bowel movements mixed with blood, nausea and vomiting.  She was originally seen by her PCP on 2/7, sent her to the ER.  Patient denies any fever, chills, nausea or vomiting.  Her labs including CBC, CMP and serum lipase were normal.  Patient is accompanied by her community coach who informed that they tried bowel regimen which helped her move her bowels after the ER visit.  She continues to drink carbonated beverages.  She does report abdominal bloating.  She has ongoing symptoms.  Patient was evaluated in 2015 for rectal bleeding and proctitis/colitis.  Fecal calprotectin levels, CRP and ESR were normal.  She was treated with cortisone enemas by [facility] gastroenterologist at that time  Follow-up visit 03/24/2023 Teresa Crawford is here to discuss about 2 months history of difficulty swallowing with both solids and liquids.  She reports that she has been eating only mashed potatoes which is leading to weight gain.  She is not able to swallow bread or any meat products.  She is taking omeprazole  40 mg twice daily, evaluated by ENT and was referred to GI for further evaluation.  She denies any heartburn, regurgitation.  She does report belching.  Follow-up visit 04/27/2023 Teresa Crawford is here to discuss about worsening of difficulty swallowing and would like to  expedite her EGD procedure.  She reports that she had gastroenteritis recently followed by which her swallowing has worsened.  Also, continues to have severe nausea for which she is taking Phenergan  and Zofran  as needed.  She reports difficulty swallowing to solids only.  Follow-up visit 08/19/2023 Teresa Crawford is here to discuss about symptoms of postprandial nausea, vomiting as well as worsening of constipation, upper abdominal discomfort and bloating.  She is accompanied by her caregiver.  She has written down the long list of her symptoms and she read it out to me.  She does have mild delayed gastric emptying, leads a sedentary lifestyle, does not eat healthy at all, lives in independent living facility.  She is on several medications as well.  Patient does not smoke or drink alcohol  NSAIDs: None  Antiplts/Anticoagulants/Anti thrombotics: None  GI Procedures:  Upper endoscopy 04/29/2023 - Normal duodenal bulb and second portion of the duodenum. - Normal stomach. Biopsied. - Esophagogastric landmarks identified. - Normal gastroesophageal junction and esophagus. Biopsied.       1. Stomach, biopsy, CBX :       - ANTRAL MUCOSA WITH FOCAL MILD REACTIVE GASTRITIS.       - UNREMARKABLE OXYNTIC MUCOSA.       - NEGATIVE FOR H. PYLORI, INTESTINAL METAPLASIA, DYSPLASIA, AND MALIGNANCY.        2. Esophagus, biopsy, CBX :       - SQUAMOUS MUCOSA WITH FOCAL SPONGIOSIS COMPATIBLE WITH REFLUX IN THE       APPROPRIATE CLINICAL SETTING.       -  NEGATIVE FOR INTRAEPITHELIAL EOSINOPHILS, DYSPLASIA, AND MALIGNANCY.   Colonoscopy 07/16/2022 - The examined portion of the ileum was normal. - Erythematous mucosa in the rectum, in the recto- sigmoid colon and in the sigmoid colon. Biopsied. - The distal rectum and anal verge are normal on retroflexion view. - The examination was otherwise normal. DIAGNOSIS:  A. COLON, SIGMOID; COLD BIOPSY:  - BENIGN COLONIC MUCOSA WITH NONSPECIFIC SUPERFICIAL SUBMUCOSAL  HYPEREMIA;  OTHERWISE NO SIGNIFICANT HISTOPATHOLOGIC CHANGE.  - NEGATIVE FOR ACTIVE MUCOSAL COLITIS, MICROSCOPIC COLITIS, AND  ARCHITECTURAL CHANGES OF CHRONIC COLITIS.  - NEGATIVE FOR DYSPLASIA AND MALIGNANCY.   B. RECTUM; COLD BIOPSY:  - BENIGN RECTAL MUCOSA WITH NONSPECIFIC SUPERFICIAL SUBMUCOSAL  HYPEREMIA; OTHERWISE NO SIGNIFICANT HISTOPATHOLOGIC CHANGE.  - NEGATIVE FOR ACTIVE MUCOSAL PROCTITIS, MICROSCOPIC PROCTITIS, AND  ARCHITECTURAL CHANGES OF CHRONIC PROCTITIS.  - NEGATIVE FOR DYSPLASIA AND MALIGNANCY.    Past Medical History:  Diagnosis Date   B12 deficiency anemia    Chronic back pain    Chronic fatigue syndrome    Constipation    Depression    Elevated liver enzymes    Endometriosis    Fibroids    Hepatic steatosis    Hypothyroid    Last menstrual period (LMP) > 10 days ago 09/12/15   Mental retardation    Migraine    Post traumatic stress disorder    Posttraumatic stress disorder    PUD (peptic ulcer disease)    Scoliosis    Splenomegaly    Uterine fibroid     Past Surgical History:  Procedure Laterality Date   ABDOMINAL HYSTERECTOMY  06/03/2016   BACK SURGERY     BIOPSY  04/29/2023   Procedure: BIOPSY;  Surgeon: Selena Daily, MD;  Location: [facility];  Service: Gastroenterology;;   COLONOSCOPY  04/27/2006   Wohl-colitis, internal hemorrhoids   COLONOSCOPY  02/2013   focal right colitis, left colon biopsy negative, focal active proctitis without chronicity   COLONOSCOPY WITH PROPOFOL  N/A 07/16/2022   Procedure: COLONOSCOPY WITH PROPOFOL ;  Surgeon: Selena Daily, MD;  Location: [facility];  Service: Gastroenterology;  Laterality: N/A;   ESOPHAGOGASTRODUODENOSCOPY  02/2013   multiple peptic & duodenal ulcers, negative h pylori   ESOPHAGOGASTRODUODENOSCOPY (EGD) WITH PROPOFOL  N/A 04/29/2023   Procedure: ESOPHAGOGASTRODUODENOSCOPY (EGD) WITH PROPOFOL ;  Surgeon: Selena Daily, MD;  Location: [facility];  Service: Gastroenterology;   Laterality: N/A;   INTRAUTERINE DEVICE INSERTION  2012   LAPAROSCOPIC ENDOMETRIOSIS FULGURATION  2015   [name]   OVARIAN CYST REMOVAL       Current Outpatient Medications:    acetaminophen  (TYLENOL ) 500 MG tablet, Take 1 tablet (500 mg total) by mouth every 6 (six) hours as needed for mild pain (pain score 1-3) or moderate pain (pain score 4-6)., Disp: 30 tablet, Rfl: 5   albuterol  (VENTOLIN  HFA) 108 (90 Base) MCG/ACT inhaler, Inhale 1-2 puffs into the lungs every 6 (six) hours as needed for wheezing or shortness of breath., Disp: 8 g, Rfl: 5   benzonatate  (TESSALON ) 200 MG capsule, Take 200 mg by mouth 2 (two) times daily as needed for cough., Disp: , Rfl:    buPROPion  (WELLBUTRIN  XL) 150 MG 24 hr tablet, Take 1 tablet (150 mg total) by mouth in the morning. Stop Bupropion  75 mg, Disp: 30 tablet, Rfl: 5   cetirizine  (ZYRTEC ) 10 MG tablet, Take 1 tablet by mouth daily., Disp: , Rfl:    COMBIVENT  RESPIMAT 20-100 MCG/ACT AERS respimat, Inhale 1 puff into the lungs every 6 (six) hours as  needed for wheezing or shortness of breath., Disp: 4 g, Rfl: 5   cyanocobalamin  (VITAMIN B12) 1000 MCG tablet, Take 1 tablet (1,000 mcg total) by mouth daily., Disp: 90 tablet, Rfl: 2   diphenhydrAMINE  (BENADRYL ) 25 mg capsule, Take 25 mg by mouth every 6 (six) hours as needed for allergies., Disp: , Rfl:    diphenoxylate -atropine  (LOMOTIL ) 2.5-0.025 MG tablet, Take 1 tablet by mouth 4 (four) times daily as needed for diarrhea or loose stools., Disp: 30 tablet, Rfl: 3   docusate sodium  (COLACE) 100 MG capsule, TAKE 1 CAPSULE BY MOUTH ONCE DAILY FOR STOOL SOFTENER, Disp: 90 capsule, Rfl: 1   EPINEPHrine  0.3 mg/0.3 mL IJ SOAJ injection, USE FOR ANAPHYLAXIS, Disp: 1 each, Rfl: 5   fluticasone  (FLONASE ) 50 MCG/ACT nasal spray, Place 2 sprays into both nostrils daily., Disp: 16 g, Rfl: 6   levocetirizine (XYZAL ) 5 MG tablet, Take 1 tablet (5 mg total) by mouth every evening., Disp: 90 tablet, Rfl: 1    norgestimate -ethinyl estradiol  (ORTHO-CYCLEN) 0.25-35 MG-MCG tablet, Take 1 tablet by mouth daily., Disp: 84 tablet, Rfl: 0   ondansetron  (ZOFRAN ) 8 MG tablet, Take 8 mg by mouth every 8 (eight) hours as needed for refractory nausea / vomiting., Disp: , Rfl:    promethazine  (PHENERGAN ) 12.5 MG suppository, Place 12.5 mg rectally every 6 (six) hours as needed for nausea or vomiting., Disp: , Rfl:    Rimegepant Sulfate (NURTEC) 75 MG TBDP, Take 1 tablet (75 mg total) by mouth every other day., Disp: 15 tablet, Rfl: 5   sucralfate  (CARAFATE ) 1 GM/10ML suspension, Take 10 mLs (1 g total) by mouth 4 (four) times daily., Disp: 420 mL, Rfl: 0   topiramate  (TOPAMAX ) 50 MG tablet, Take 1 tablet (50 mg total) by mouth every evening., Disp: 30 tablet, Rfl: 5   traMADol  (ULTRAM ) 50 MG tablet, ONE TAB PO BID PRN FOR PAIN AND HEADCAHES, Disp: 60 tablet, Rfl: 2   triamcinolone  cream (KENALOG ) 0.1 %, Apply 1 Application topically 2 (two) times daily as needed (itching and rash)., Disp: 453.6 g, Rfl: 1   Vitamin D , Ergocalciferol , (DRISDOL ) 1.25 MG (50000 UNIT) CAPS capsule, TAKE 1 CAPSULE BY MOUTH EVERY 7 DAYS, Disp: 12 capsule, Rfl: 1   Vonoprazan Fumarate  (VOQUEZNA ) 10 MG TABS, Take 10 tablets by mouth daily., Disp: 30 tablet, Rfl: 5   Family History  Problem Relation Age of Onset   Breast cancer Mother 40   COPD Father    Lung cancer Father 76   Dementia Father    Diabetes Sister    Diabetes Brother    Ovarian cancer Paternal Aunt    Colon cancer Maternal Grandfather 101   Liver disease Maternal Aunt        ?etiology   Lymphoma Maternal Grandmother 36   Lung cancer Other 83       maternal great grandfather   Stomach cancer Other 50       maternal great grandmother     Social History   Tobacco Use   Smoking status: Never   Smokeless tobacco: Never  Vaping Use   Vaping status: Never Used  Substance Use Topics   Alcohol use: Not Currently    Alcohol/week: 0.0 standard drinks of alcohol   Drug  use: No    Allergies as of 08/19/2023 - Review Complete 08/19/2023  Allergen Reaction Noted   Other Anaphylaxis 08/30/2013   Peanut butter flavoring agent (non-screening) Swelling 08/24/2014   Sertraline  Other (See Comments) 11/26/2014   Codeine  Nausea  And Vomiting 12/10/2011   Paroxetine Other (See Comments) 09/28/2014   Paroxetine hcl Other (See Comments) 12/10/2011   Prednisone  & diphenhydramine  Rash 07/23/2021   Venlafaxine Other (See Comments) 09/28/2014   Zoloft  [sertraline  hcl] Other (See Comments) 12/10/2011    Review of Systems:    All systems reviewed and negative except where noted in HPI.   Physical Exam:  BP (!) 148/78 (BP Location: Left Arm, Patient Position: Sitting, Cuff Size: Large)   Pulse 84   Temp 98.3 F (36.8 C) (Oral)   Wt 200 lb (90.7 kg)   LMP 12/06/2015   BMI 35.43 kg/m  Patient's last menstrual period was 12/06/2015.  General:   Alert,  Well-developed, well-nourished, pleasant and cooperative in NAD Head:  Normocephalic and atraumatic. Eyes:  Sclera clear, no icterus.   Conjunctiva pink. Ears:  Normal auditory acuity. Nose:  No deformity, discharge, or lesions. Mouth:  No deformity or lesions,oropharynx pink & moist. Neck:  Supple; no masses or thyromegaly. Lungs:  Respirations even and unlabored.  Clear throughout to auscultation.   No wheezes, crackles, or rhonchi. No acute distress. Heart:  Regular rate and rhythm; no murmurs, clicks, rubs, or gallops. Abdomen:  Normal bowel sounds. Soft, obese, nontender and non-distended without masses, hepatosplenomegaly or hernias noted.  No guarding or rebound tenderness.   Rectal: Not performed Msk:  Symmetrical without gross deformities. Good, equal movement & strength bilaterally. Pulses:  Normal pulses noted. Extremities:  No clubbing or edema.  No cyanosis. Neurologic:  Alert and oriented x3;  grossly normal neurologically. Skin:  Intact without significant lesions or rashes. No jaundice. Psych:   Alert and cooperative. Normal mood and affect.  Imaging Studies: Reviewed  Assessment and Plan:   Teresa Crawford is a 38 y.o. female with history of MR, obesity, chronic GERD, endometriosis, total hysterectomy is seen in consultation for chronic symptoms of postprandial nausea, vomiting, upper abdominal discomfort, severe constipation.  Her symptoms are due to sedentary lifestyle, poor eating habits.  EGD with esophageal biopsies was unremarkable Has mild delayed gastric emptying.  Given polypharmacy, I am hesitant to prescribe prokinetic agent to her Discussed in length regarding lifestyle modification, small frequent meal, healthy eating habits Start Linzess  145 mcg daily  Follow up after above workup   Teresa Oz, MD

## 2023-08-19 NOTE — Patient Instructions (Signed)
 Take Linzess 145 mcg daily 30 minutes before first meal of the day  Take Mirilax daily as needed

## 2023-08-24 ENCOUNTER — Ambulatory Visit: Admitting: Licensed Clinical Social Worker

## 2023-08-25 ENCOUNTER — Other Ambulatory Visit: Payer: Self-pay | Admitting: Nurse Practitioner

## 2023-08-25 ENCOUNTER — Telehealth: Payer: Self-pay | Admitting: Gastroenterology

## 2023-08-25 DIAGNOSIS — E538 Deficiency of other specified B group vitamins: Secondary | ICD-10-CM

## 2023-08-25 MED ORDER — POLYETHYLENE GLYCOL 3350 17 GM/SCOOP PO POWD: 17.0000 g | Freq: Every day | ORAL | 3 refills | Status: AC

## 2023-08-25 NOTE — Telephone Encounter (Signed)
 Pt requesting miralax to be called in to [facility] she states the samples really worked

## 2023-08-25 NOTE — Addendum Note (Signed)
 Addended by: Lovie Rudder on: 08/25/2023 04:40 PM   Modules accepted: Orders

## 2023-08-25 NOTE — Telephone Encounter (Signed)
Please reivew and send

## 2023-08-26 ENCOUNTER — Telehealth: Payer: Self-pay

## 2023-08-26 NOTE — Telephone Encounter (Signed)
 Pt called back and said she is cancelling test because she does not have the money to proceed with it. Advised her she has 60 days from cancellation date (08/25/23) if she wanted to proceed.

## 2023-08-26 NOTE — Telephone Encounter (Signed)
Rx sent through [facility]  

## 2023-08-26 NOTE — Telephone Encounter (Signed)
 Received Fax from [facility] that pt's test had been cancelled due to lack of insurance coverage. Reached out to [facility] and was advised pt's insurance follows Medicare's criteria for test coverage and pt does not meet it PLUS pt does not have history of cancer. I was advised by the [facility] rep they reached out to pt on 08/18/23 to let her know and if she wanted to proceed with OOP $249 and pt said she will talk with her family and call them back. She was given one week and she has not called [facility]. Test has been cancelled. Called pt to f/u on thoughts of continuing test, no answer, LVMTRC.

## 2023-09-11 ENCOUNTER — Encounter: Payer: Self-pay | Admitting: Nurse Practitioner

## 2023-09-17 ENCOUNTER — Ambulatory Visit (INDEPENDENT_AMBULATORY_CARE_PROVIDER_SITE_OTHER): Admitting: Licensed Clinical Social Worker

## 2023-09-17 DIAGNOSIS — F330 Major depressive disorder, recurrent, mild: Secondary | ICD-10-CM | POA: Diagnosis not present

## 2023-09-17 DIAGNOSIS — F4310 Post-traumatic stress disorder, unspecified: Secondary | ICD-10-CM
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
## 2023-09-24 ENCOUNTER — Telehealth: Payer: Self-pay | Admitting: Nurse Practitioner

## 2023-09-24 NOTE — Telephone Encounter (Signed)
 Patient called requesting appointment for stomach issues, constipation, etc. I advised her to call her GI we referred her to. She stated the provider she saw is no longer there. I told to call to see if another provider can see her-Teresa Crawford

## 2023-09-27 DIAGNOSIS — R569 Unspecified convulsions: Secondary | ICD-10-CM | POA: Diagnosis not present

## 2023-09-27 DIAGNOSIS — G43809 Other migraine, not intractable, without status migrainosus: Secondary | ICD-10-CM | POA: Diagnosis not present

## 2023-09-27 DIAGNOSIS — Z1331 Encounter for screening for depression: Secondary | ICD-10-CM | POA: Diagnosis not present

## 2023-09-27 DIAGNOSIS — R42 Dizziness and giddiness: Secondary | ICD-10-CM | POA: Diagnosis not present

## 2023-09-28 ENCOUNTER — Ambulatory Visit (INDEPENDENT_AMBULATORY_CARE_PROVIDER_SITE_OTHER): Admitting: Nurse Practitioner

## 2023-09-28 ENCOUNTER — Encounter: Payer: Self-pay | Admitting: Nurse Practitioner

## 2023-09-28 VITALS — BP 124/82 | HR 100 | Temp 98.2°F | Resp 16 | Ht 63.0 in | Wt 193.4 lb

## 2023-09-28 DIAGNOSIS — K5900 Constipation, unspecified: Secondary | ICD-10-CM

## 2023-09-28 DIAGNOSIS — K56609 Unspecified intestinal obstruction, unspecified as to partial versus complete obstruction: Secondary | ICD-10-CM

## 2023-09-28 DIAGNOSIS — R1114 Bilious vomiting: Secondary | ICD-10-CM

## 2023-09-28 MED ORDER — BISACODYL 10 MG RE SUPP: 10.0000 mg | Freq: Every day | RECTAL | 4 refills | Status: DC | PRN

## 2023-09-28 MED ORDER — MAGNESIUM CITRATE PO SOLN: 0.5000 | Freq: Once | ORAL | 0 refills | Status: AC

## 2023-09-28 NOTE — Progress Notes (Signed)
 [facility] [address] [place], [place] [address]  Internal MEDICINE  Office Visit Note  Patient Name: Teresa Crawford  [id number]  [id number]  Date of Service: 09/28/2023  Chief Complaint  Patient presents with   Acute Visit    Gastric issues.      HPI Teresa Crawford presents for an acute sick visit for constipation Constipated for the past 9 days. Tried senokot-s, colace, dulcolax, increased water.      Current Medication:  Outpatient Encounter Medications as of 09/28/2023  Medication Sig   bisacodyl  (DULCOLAX) 10 MG suppository Place 1 suppository (10 mg total) rectally daily as needed for moderate constipation or severe constipation.   magnesium  citrate SOLN Take 148 mLs (0.5 Bottles total) by mouth once for 1 dose. May drink the other half of the bottle if no results within 8 hours.   acetaminophen  (TYLENOL ) 500 MG tablet Take 1 tablet (500 mg total) by mouth every 6 (six) hours as needed for mild pain (pain score 1-3) or moderate pain (pain score 4-6).   albuterol  (VENTOLIN  HFA) 108 (90 Base) MCG/ACT inhaler Inhale 1-2 puffs into the lungs every 6 (six) hours as needed for wheezing or shortness of breath.   benzonatate  (TESSALON ) 200 MG capsule Take 200 mg by mouth 2 (two) times daily as needed for cough.   buPROPion  (WELLBUTRIN  XL) 150 MG 24 hr tablet Take 1 tablet (150 mg total) by mouth in the morning. Stop Bupropion  75 mg   cetirizine  (ZYRTEC ) 10 MG tablet Take 1 tablet by mouth daily.   COMBIVENT  RESPIMAT 20-100 MCG/ACT AERS respimat Inhale 1 puff into the lungs every 6 (six) hours as needed for wheezing or shortness of breath.   cyanocobalamin  (VITAMIN B12) 1000 MCG tablet TAKE 1 TABLET BY MOUTH ONCE DAILY   diphenhydrAMINE  (BENADRYL ) 25 mg capsule Take 25 mg by mouth every 6 (six) hours as needed for allergies.   diphenoxylate -atropine  (LOMOTIL ) 2.5-0.025 MG tablet Take 1 tablet by mouth 4 (four) times daily as needed for diarrhea or loose stools.   docusate  sodium (COLACE) 100 MG capsule TAKE 1 CAPSULE BY MOUTH ONCE DAILY FOR STOOL SOFTENER   EPINEPHrine  0.3 mg/0.3 mL IJ SOAJ injection USE FOR ANAPHYLAXIS   fluticasone  (FLONASE ) 50 MCG/ACT nasal spray Place 2 sprays into both nostrils daily.   levocetirizine (XYZAL ) 5 MG tablet Take 1 tablet (5 mg total) by mouth every evening.   linaclotide  (LINZESS ) 145 MCG CAPS capsule Take 1 capsule (145 mcg total) by mouth daily before breakfast.   norgestimate -ethinyl estradiol  (ORTHO-CYCLEN) 0.25-35 MG-MCG tablet Take 1 tablet by mouth daily.   ondansetron  (ZOFRAN ) 8 MG tablet Take 8 mg by mouth every 8 (eight) hours as needed for refractory nausea / vomiting.   polyethylene glycol powder (GLYCOLAX /MIRALAX ) 17 GM/SCOOP powder Take 17 g by mouth daily.   promethazine  (PHENERGAN ) 12.5 MG suppository Place 12.5 mg rectally every 6 (six) hours as needed for nausea or vomiting.   Rimegepant Sulfate (NURTEC) 75 MG TBDP Take 1 tablet (75 mg total) by mouth every other day.   sucralfate  (CARAFATE ) 1 GM/10ML suspension Take 10 mLs (1 g total) by mouth 4 (four) times daily.   topiramate  (TOPAMAX ) 50 MG tablet Take 1 tablet (50 mg total) by mouth every evening.   traMADol  (ULTRAM ) 50 MG tablet ONE TAB PO BID PRN FOR PAIN AND HEADCAHES   triamcinolone  cream (KENALOG ) 0.1 % Apply 1 Application topically 2 (two) times daily as needed (itching and rash).   Vitamin D , Ergocalciferol , (  DRISDOL ) 1.25 MG (50000 UNIT) CAPS capsule TAKE 1 CAPSULE BY MOUTH EVERY 7 DAYS   Vonoprazan Fumarate  (VOQUEZNA ) 10 MG TABS Take 10 tablets by mouth daily.   No facility-administered encounter medications on file as of 09/28/2023.      Medical History: Past Medical History:  Diagnosis Date   B12 deficiency anemia    Chronic back pain    Chronic fatigue syndrome    Constipation    Depression    Elevated liver enzymes    Endometriosis    Fibroids    Hepatic steatosis    Hypothyroid    Last menstrual period (LMP) > 10 days ago 09/12/15    Mental retardation    Migraine    Post traumatic stress disorder    Posttraumatic stress disorder    PUD (peptic ulcer disease)    Scoliosis    Splenomegaly    Uterine fibroid      Vital Signs: BP 124/82   Pulse 100   Temp 98.2 F (36.8 C)   Resp 16   Ht 5' 3 (1.6 m)   Wt 193 lb 6.4 oz (87.7 kg)   LMP 12/06/2015   SpO2 98%   BMI 34.26 kg/m    Review of Systems  Constitutional:  Negative for fatigue and fever.  HENT:  Negative for congestion, mouth sores and postnasal drip.   Respiratory:  Negative for cough, chest tightness, shortness of breath and wheezing.   Cardiovascular:  Negative for chest pain and palpitations.  Gastrointestinal:  Positive for abdominal distention, abdominal pain, constipation, nausea and vomiting. Negative for diarrhea.       Bloating, early satiety  Genitourinary:  Negative for flank pain.  Psychiatric/Behavioral: Negative.      Physical Exam Vitals reviewed.  Constitutional:      General: She is not in acute distress.    Appearance: Normal appearance. She is obese. She is not ill-appearing or diaphoretic.  HENT:     Head: Normocephalic and atraumatic.  Eyes:     Pupils: Pupils are equal, round, and reactive to light.  Cardiovascular:     Rate and Rhythm: Normal rate and regular rhythm.  Pulmonary:     Effort: Pulmonary effort is normal. No respiratory distress.  Abdominal:     General: Bowel sounds are normal. There is distension.     Palpations: Abdomen is soft. There is no mass.     Tenderness: There is abdominal tenderness. There is no guarding or rebound.     Hernia: No hernia is present.  Neurological:     Mental Status: She is alert and oriented to person, place, and time.  Psychiatric:        Mood and Affect: Mood normal.        Behavior: Behavior normal.       Assessment/Plan: 1. Constipation, unspecified constipation type (Primary) Try mag citrate solution, try dulcolax suppository. CT abd pelvis ordered. Follow  up as needed if no relief.  - CT ABDOMEN PELVIS WO CONTRAST; Future - magnesium  citrate SOLN; Take 148 mLs (0.5 Bottles total) by mouth once for 1 dose. May drink the other half of the bottle if no results within 8 hours.  Dispense: 296 mL; Refill: 0 - bisacodyl  (DULCOLAX) 10 MG suppository; Place 1 suppository (10 mg total) rectally daily as needed for moderate constipation or severe constipation.  Dispense: 12 suppository; Refill: 4  2. Bilious vomiting with nausea CT abd pelvis ordered  - CT ABDOMEN PELVIS WO CONTRAST; Future  3. Intestinal  obstruction, unspecified cause, unspecified whether partial or complete (HCC) Rule out obstruction, CT abd pelvis ordered.  - CT ABDOMEN PELVIS WO CONTRAST; Future   General Counseling: darlyn repsher understanding of the findings of todays visit and agrees with plan of treatment. I have discussed any further diagnostic evaluation that may be needed or ordered today. We also reviewed her medications today. she has been encouraged to call the office with any questions or concerns that should arise related to todays visit.    Counseling:    Orders Placed This Encounter  Procedures   CT ABDOMEN PELVIS WO CONTRAST    Meds ordered this encounter  Medications   magnesium  citrate SOLN    Sig: Take 148 mLs (0.5 Bottles total) by mouth once for 1 dose. May drink the other half of the bottle if no results within 8 hours.    Dispense:  296 mL    Refill:  0    Fill new script today   bisacodyl  (DULCOLAX) 10 MG suppository    Sig: Place 1 suppository (10 mg total) rectally daily as needed for moderate constipation or severe constipation.    Dispense:  12 suppository    Refill:  4    Fill new script today    Return in about 3 weeks (around 10/19/2023) for F/U, Torrence Branagan PCP for CT abd/pelvis results .  [place] Controlled Substance Database was reviewed by me for overdose risk score (ORS)  Time spent:30 Minutes Time spent with patient included  reviewing progress notes, labs, imaging studies, and discussing plan for follow up.   This patient was seen by Mardy Maxin, FNP-C in collaboration with Dr. Sigrid Bathe as a part of collaborative care agreement.  Tavion Senkbeil R. Maxin, MSN, FNP-C Internal Medicine

## 2023-10-04 ENCOUNTER — Ambulatory Visit
Admission: RE | Admit: 2023-10-04 | Discharge: 2023-10-04 | Disposition: A | Discharge status: Home / Self Care | Source: Ambulatory Visit | Attending: Nurse Practitioner

## 2023-10-04 DIAGNOSIS — K5900 Constipation, unspecified: Secondary | ICD-10-CM | POA: Diagnosis not present

## 2023-10-04 DIAGNOSIS — R1114 Bilious vomiting: Secondary | ICD-10-CM

## 2023-10-04 DIAGNOSIS — K56609 Unspecified intestinal obstruction, unspecified as to partial versus complete obstruction: Secondary | ICD-10-CM

## 2023-10-04 DIAGNOSIS — N200 Calculus of kidney: Secondary | ICD-10-CM | POA: Diagnosis not present

## 2023-10-11 ENCOUNTER — Telehealth: Payer: Self-pay | Admitting: Nurse Practitioner

## 2023-10-11 NOTE — Telephone Encounter (Signed)
 Lvm w/ [facility] to return my call regarding MR request-Teresa Crawford

## 2023-10-12 ENCOUNTER — Telehealth: Payer: Self-pay | Admitting: Nurse Practitioner

## 2023-10-12 NOTE — Telephone Encounter (Signed)
 MR hand-delivered to [facility];  [address] [place], [place] [address]

## 2023-10-14 DIAGNOSIS — K3184 Gastroparesis: Secondary | ICD-10-CM | POA: Diagnosis not present

## 2023-10-14 DIAGNOSIS — K5909 Other constipation: Secondary | ICD-10-CM | POA: Diagnosis not present

## 2023-10-15 ENCOUNTER — Ambulatory Visit (INDEPENDENT_AMBULATORY_CARE_PROVIDER_SITE_OTHER): Admitting: Licensed Clinical Social Worker

## 2023-10-15 DIAGNOSIS — F4310 Post-traumatic stress disorder, unspecified: Secondary | ICD-10-CM

## 2023-10-15 DIAGNOSIS — F330 Major depressive disorder, recurrent, mild: Secondary | ICD-10-CM | POA: Diagnosis not present

## 2023-10-15 NOTE — Progress Notes (Signed)
 THERAPIST PROGRESS NOTE  Virtual Visit via Video Note  I connected with Teresa Crawford on 10/15/23 at 11:15am by a video enabled telemedicine application and verified that I am speaking with the correct person using two identifiers.  Location: Patient: Address on file  Provider: Providers address   I discussed the limitations of evaluation and management by telemedicine and the availability of in person appointments. The patient expressed understanding and agreed to proceed.   I discussed the assessment and treatment plan with the patient. The patient was provided an opportunity to ask questions and all were answered. The patient agreed with the plan and demonstrated an understanding of the instructions.   The patient was advised to call back or seek an in-person evaluation if the symptoms worsen or if the condition fails to improve as anticipated.  I provided 42 minutes of non-face-to-face time during this encounter.   Teresa KATHEE Husband, LCSW   Session Time: 11:15pm-11:58pm  Participation Level: Active  Behavioral Response: CasualAlertDepressed  Type of Therapy: Individual Therapy  Treatment Goals addressed:   Active     Anxiety     LTG: Desirai will score less than 5 on the Generalized Anxiety Disorder 7 Scale (GAD-7)      Start:  10/15/23         STG: Brittnie will practice problem solving skills 3 times per week for the next 4 weeks.      Start:  10/15/23         Perform psychoeducation regarding anxiety disorders     Start:  10/15/23         Provide Teresa with educational information and reading material on anxiety, its causes, and symptoms.      Start:  10/15/23         Coping Skills     Start:  10/15/23       Will work with the pt using CBT/DBT techniques to help the pt verbalize an understanding of the cognitive, physiological, and behavioral components of anxiety and its treatment. This will be done by using worksheets, interactive  activities, CBT/ABC thought logs, modeling, homework, role playing and journaling. Will work with pt to learn and implement coping skills that result in a reduction of anxiety and improve daily functioning per pt self-report 3 out of 5 documented sessions.        Progress Towards Goals: Progressing  Interventions: CBT, Supportive, and Reframing  Summary: Teresa Crawford is a 38 y.o. female who presents with symptoms of anxiety and bereavement. Patient identifies symptoms to include uncontrollable worry, negative self affect, low mood, and fatigue. Pt was oriented times 5. Pt was cooperative and engaged. Pt denies SI/HI/AVH.   The patient used the session to further process her chronic health conditions and the anxiety surrounding the uncertainty of her situation. The clinician assessed her for negative thoughts regarding her health. The patient expressed continuing worry about whether her health will improve, but noted that her situation has improved since she received answers to her concerns. She reported making an effort to stay busy as a positive distraction from her anxious thoughts and has been using breathing techniques to manage the physical symptoms of anxiety.  She also shared recent trauma triggers and explained how she was able to manage her emotional responses through cognitive-behavioral therapy (CBT) and reframing. The patient is being mindful of limiting her exposure to these triggers. She continued using Eye Movement Desensitization and Reprocessing (EMDR) therapy, focusing on the negative cognition I should  have done more. Through this process, she addressed her childhood trauma, which reduced her Subjective Units of Distress (SUD) score from 10 to 3. A new belief was instilled in her: I survived. The patient utilized the 7-11 breathing technique to help return to her baseline state.  Suicidal/Homicidal: Nowithout intent/plan  Therapist Response: Clinician utilized active and  supportive reflection to create a safe environment for patient to process recent life stressors. Clinician assessed for current stressors, symptoms, and safety since last session. Processed anxiety around health conditions. Began EMDR to process trauma memories from her childhood.   Plan: Return again in 4 weeks.  Diagnosis: Mild episode of recurrent major depressive disorder (HCC)  Posttraumatic stress disorder   Collaboration of Care: [facility] psychiatrist can access notes and cln. Will review psychiatrists' notes. Check in with the patient and will see LCSW per availability. Patient agreed with treatment recommendations.   Patient/Guardian was advised Release of Information must be obtained prior to any record release in order to collaborate their care with an outside provider. Patient/Guardian was advised if they have not already done so to contact the registration department to sign all necessary forms in order for us  to release information regarding their care.   Consent: Patient/Guardian gives verbal consent for treatment and assignment of benefits for services provided during this visit. Patient/Guardian expressed understanding and agreed to proceed.   Teresa KATHEE Husband, LCSW 10/15/2023

## 2023-10-15 NOTE — Addendum Note (Signed)
 Addended by: LANELL SMALL B on: 10/15/2023 11:59 AM   Modules accepted: Level of Service

## 2023-10-15 NOTE — Progress Notes (Deleted)
  Clinician attempted session via telehealth, but Teresa Crawford did not join. Per [employer], s/he will be charged a no-show fee.

## 2023-10-21 ENCOUNTER — Other Ambulatory Visit: Payer: Self-pay | Admitting: Obstetrics

## 2023-10-21 DIAGNOSIS — R102 Pelvic and perineal pain: Secondary | ICD-10-CM

## 2023-10-21 DIAGNOSIS — Z8742 Personal history of other diseases of the female genital tract: Secondary | ICD-10-CM

## 2023-10-21 NOTE — Telephone Encounter (Signed)
 Messaged patient reminding her to schedule her three month follow-up for any further refills.

## 2023-10-22 ENCOUNTER — Other Ambulatory Visit: Payer: Self-pay

## 2023-10-22 DIAGNOSIS — R102 Pelvic and perineal pain: Secondary | ICD-10-CM

## 2023-10-22 DIAGNOSIS — Z8742 Personal history of other diseases of the female genital tract: Secondary | ICD-10-CM

## 2023-10-22 MED ORDER — NORGESTIMATE-ETH ESTRADIOL 0.25-35 MG-MCG PO TABS: 1.0000 | ORAL_TABLET | Freq: Every day | ORAL | 0 refills | Status: DC

## 2023-10-28 ENCOUNTER — Encounter: Payer: Self-pay | Admitting: Nurse Practitioner

## 2023-10-29 ENCOUNTER — Other Ambulatory Visit: Payer: Self-pay | Admitting: Nurse Practitioner

## 2023-10-29 DIAGNOSIS — E559 Vitamin D deficiency, unspecified: Secondary | ICD-10-CM

## 2023-11-04 ENCOUNTER — Encounter: Payer: Self-pay | Admitting: Nurse Practitioner

## 2023-11-04 ENCOUNTER — Ambulatory Visit: Admitting: Nurse Practitioner

## 2023-11-04 ENCOUNTER — Telehealth (INDEPENDENT_AMBULATORY_CARE_PROVIDER_SITE_OTHER): Admitting: Psychiatry

## 2023-11-04 ENCOUNTER — Encounter: Payer: Self-pay | Admitting: Psychiatry

## 2023-11-04 VITALS — BP 120/84 | HR 86 | Temp 97.4°F | Resp 16 | Ht 63.0 in | Wt 192.8 lb

## 2023-11-04 DIAGNOSIS — K219 Gastro-esophageal reflux disease without esophagitis: Secondary | ICD-10-CM

## 2023-11-04 DIAGNOSIS — F3342 Major depressive disorder, recurrent, in full remission: Secondary | ICD-10-CM

## 2023-11-04 DIAGNOSIS — G43809 Other migraine, not intractable, without status migrainosus: Secondary | ICD-10-CM | POA: Diagnosis not present

## 2023-11-04 DIAGNOSIS — F79 Unspecified intellectual disabilities: Secondary | ICD-10-CM

## 2023-11-04 DIAGNOSIS — K5901 Slow transit constipation: Secondary | ICD-10-CM

## 2023-11-04 DIAGNOSIS — K30 Functional dyspepsia: Secondary | ICD-10-CM

## 2023-11-04 DIAGNOSIS — F4310 Post-traumatic stress disorder, unspecified: Secondary | ICD-10-CM

## 2023-11-04 MED ORDER — LINACLOTIDE 290 MCG PO CAPS: 290.0000 ug | ORAL_CAPSULE | Freq: Every day | ORAL | 3 refills | Status: AC

## 2023-11-04 NOTE — Progress Notes (Unsigned)
 [facility] MD OP Progress Note Virtual Visit via Video Note  I connected with Teresa Crawford on 11/04/23 at  4:00 PM EDT by a video enabled telemedicine application and verified that I am speaking with the correct person using two identifiers.  Location Provider Location : [facility] Patient Location : Home  Participants: Patient , Provider   I discussed the limitations of evaluation and management by telemedicine and the availability of in person appointments. The patient expressed understanding and agreed to proceed.    I discussed the assessment and treatment plan with the patient. The patient was provided an opportunity to ask questions and all were answered. The patient agreed with the plan and demonstrated an understanding of the instructions.   The patient was advised to call back or seek an in-person evaluation if the symptoms worsen or if the condition fails to improve as anticipated.  Video connection was lost at less than 50% of the duration of the visit, at which time the remainder of the visit was completed through audio only    11/05/2023 8:18 AM Teresa Crawford  MRN:  [id number]  Chief Complaint:  Chief Complaint  Patient presents with   Follow-up   Anxiety   Depression   Medication Refill   HPI: Teresa Crawford is a 38 year old Caucasian female, single, lives in [place], has a history of MDD, PTSD, insomnia, bereavement, history of seizure-like spells, likely psychogenic, on SSD was evaluated by audio only appointment since patient had problems with video connection.  She is currently struggling with gastrointestinal issues including constipation.  She is currently on Linzess  prescribed by gastroenterology and is taking over-the-counter fiber supplements.  She has not noticed much change although she believes nausea has improved.  She is currently trying to eat better.  She reports she is trying to find a new job since she is not as nauseous as before.  She quit her [employer] job and is planning to find another similar kind of job.  She denies any significant depression, anxiety symptoms.  She is currently compliant on the Wellbutrin , denies side effects.  She reports sleep is overall good.  She was recently started on nortriptyline by neurology which also likely helping with sleep.  She denies any suicidality, homicidality or perceptual disturbances.  She denies any other concerns today.  Visit Diagnosis:    ICD-10-CM   1. Recurrent major depressive disorder, in full remission (HCC)  F33.42     2. Posttraumatic stress disorder  F43.10     3. Intellectual disability  F79    Likely mild      Past Psychiatric History: I have reviewed past psychiatric history from progress note on 08/16/2018.  Past trials of fluoxetine .  Past Medical History:  Past Medical History:  Diagnosis Date   B12 deficiency anemia    Chronic back pain    Chronic fatigue syndrome    Constipation    Depression    Elevated liver enzymes    Endometriosis    Fibroids    Hepatic steatosis    Hypothyroid    Last menstrual period (LMP) > 10 days ago 09/12/15   Mental retardation    Migraine    Post traumatic stress disorder    Posttraumatic stress disorder    PUD (peptic ulcer disease)    Scoliosis    Splenomegaly    Uterine fibroid     Past Surgical History:  Procedure Laterality Date   ABDOMINAL HYSTERECTOMY  06/03/2016   BACK SURGERY  BIOPSY  04/29/2023   Procedure: BIOPSY;  Surgeon: Unk Corinn Skiff, MD;  Location: [facility] ENDOSCOPY;  Service: Gastroenterology;;   COLONOSCOPY  04/27/2006   Wohl-colitis, internal hemorrhoids   COLONOSCOPY  02/2013   focal right colitis, left colon biopsy negative, focal active proctitis without chronicity   COLONOSCOPY WITH PROPOFOL  N/A 07/16/2022   Procedure: COLONOSCOPY WITH PROPOFOL ;  Surgeon: Unk Corinn Skiff, MD;  Location: [facility] ENDOSCOPY;  Service: Gastroenterology;  Laterality: N/A;   ESOPHAGOGASTRODUODENOSCOPY   02/2013   multiple peptic & duodenal ulcers, negative h pylori   ESOPHAGOGASTRODUODENOSCOPY (EGD) WITH PROPOFOL  N/A 04/29/2023   Procedure: ESOPHAGOGASTRODUODENOSCOPY (EGD) WITH PROPOFOL ;  Surgeon: Unk Corinn Skiff, MD;  Location: [facility] ENDOSCOPY;  Service: Gastroenterology;  Laterality: N/A;   INTRAUTERINE DEVICE INSERTION  2012   LAPAROSCOPIC ENDOMETRIOSIS FULGURATION  2015   [name]   OVARIAN CYST REMOVAL      Family Psychiatric History: I have reviewed family psychiatric history from progress note on 08/16/2018.  Family History:  Family History  Problem Relation Age of Onset   Breast cancer Mother 75   COPD Father    Lung cancer Father 61   Dementia Father    Diabetes Sister    Diabetes Brother    Lymphoma Maternal Grandmother 13   Colon cancer Maternal Grandfather 42   Stomach cancer Paternal Grandmother    Lung cancer Paternal Grandfather    Liver disease Maternal Aunt        ?etiology   Ovarian cancer Paternal Aunt        43s   Lung cancer Other 60       maternal great grandfather   Stomach cancer Other 61       maternal great grandmother    Social History: I have reviewed social history from progress note on 08/16/2018. Social History   Socioeconomic History   Marital status: Single    Spouse name: Not on file   Number of children: 0   Years of education: Not on file   Highest education level: Not on file  Occupational History   Not on file  Tobacco Use   Smoking status: Never   Smokeless tobacco: Never  Vaping Use   Vaping status: Never Used  Substance and Sexual Activity   Alcohol use: Not Currently    Alcohol/week: 0.0 standard drinks of alcohol   Drug use: No   Sexual activity: Not Currently  Other Topics Concern   Not on file  Social History Narrative   She has brother, half brother & sister live nearby   Both parents deceased   Care managed by [facility]   Social Drivers of Health   Financial Resource Strain: Low Risk  (10/14/2023)    Received from [facility]   Overall Financial Resource Strain (CARDIA)    Difficulty of Paying Living Expenses: Not very hard  Food Insecurity: No Food Insecurity (10/14/2023)   Received from [facility]   Hunger Vital Sign    Within the past 12 months, you worried that your food would run out before you got the money to buy more.: Never true    Within the past 12 months, the food you bought just didn't last and you didn't have money to get more.: Never true  Transportation Needs: No Transportation Needs (10/14/2023)   Received from [facility] - Transportation    In the past 12 months, has lack of transportation kept you from medical appointments or from  getting medications?: No    Lack of Transportation (Non-Medical): No  Physical Activity: Inactive (02/11/2017)   Exercise Vital Sign    Days of Exercise per Week: 0 days    Minutes of Exercise per Session: 0 min  Stress: No Stress Concern Present (02/11/2017)   [employer] of Occupational Health - Occupational Stress Questionnaire    Feeling of Stress : Not at all  Social Connections: Unknown (02/11/2017)   Social Connection and Isolation Panel    Frequency of Communication with Friends and Family: Patient declined    Frequency of Social Gatherings with Friends and Family: Patient declined    Attends Religious Services: Patient declined    Database administrator or Organizations: Patient declined    Attends Banker Meetings: Patient declined    Marital Status: Patient declined    Allergies:  Allergies  Allergen Reactions   Other Anaphylaxis    Peanut butter, uses Epi pen   Peanut Butter Flavoring Agent (Non-Screening) Swelling    Throat swells Throat swells   Sertraline  Other (See Comments)    Panic attacks   Codeine  Nausea And Vomiting    nausea   Paroxetine Other (See Comments)    Panic Attack    Paroxetine Hcl Other (See Comments)    Panic  attack Other reaction(s): Other (See Comments) Panic attacks   Prednisone  & Diphenhydramine  Rash   Venlafaxine Other (See Comments)    Panic Attack    Zoloft  [Sertraline  Hcl] Other (See Comments)    Panic attack    Metabolic Disorder Labs: Lab Results  Component Value Date   HGBA1C 5.5 12/20/2019   No results found for: PROLACTIN Lab Results  Component Value Date   CHOL 155 01/15/2022   TRIG 97 01/15/2022   HDL 34 (L) 01/15/2022   CHOLHDL 4.6 (H) 01/15/2022   LDLCALC 103 (H) 01/15/2022   LDLCALC 99 12/27/2020   Lab Results  Component Value Date   TSH 2.780 05/07/2023   TSH 3.060 12/28/2022    Therapeutic Level Labs: No results found for: LITHIUM No results found for: VALPROATE No results found for: CBMZ  Current Medications: Current Outpatient Medications  Medication Sig Dispense Refill   acetaminophen  (TYLENOL ) 500 MG tablet Take 1 tablet (500 mg total) by mouth every 6 (six) hours as needed for mild pain (pain score 1-3) or moderate pain (pain score 4-6). 30 tablet 5   albuterol  (VENTOLIN  HFA) 108 (90 Base) MCG/ACT inhaler Inhale 1-2 puffs into the lungs every 6 (six) hours as needed for wheezing or shortness of breath. 8 g 5   benzonatate  (TESSALON ) 200 MG capsule Take 200 mg by mouth 2 (two) times daily as needed for cough.     buPROPion  (WELLBUTRIN  XL) 150 MG 24 hr tablet Take 1 tablet (150 mg total) by mouth in the morning. Stop Bupropion  75 mg 30 tablet 5   cetirizine  (ZYRTEC ) 10 MG tablet Take 1 tablet by mouth daily.     COMBIVENT  RESPIMAT 20-100 MCG/ACT AERS respimat Inhale 1 puff into the lungs every 6 (six) hours as needed for wheezing or shortness of breath. 4 g 5   cyanocobalamin  (VITAMIN B12) 1000 MCG tablet TAKE 1 TABLET BY MOUTH ONCE DAILY 90 tablet 2   diphenhydrAMINE  (BENADRYL ) 25 mg capsule Take 25 mg by mouth every 6 (six) hours as needed for allergies.     diphenoxylate -atropine  (LOMOTIL ) 2.5-0.025 MG tablet Take 1 tablet by mouth 4 (four)  times daily as needed for diarrhea or loose stools.  30 tablet 3   docusate sodium  (COLACE) 100 MG capsule TAKE 1 CAPSULE BY MOUTH ONCE DAILY FOR STOOL SOFTENER 90 capsule 1   EPINEPHrine  0.3 mg/0.3 mL IJ SOAJ injection USE FOR ANAPHYLAXIS 1 each 5   fluticasone  (FLONASE ) 50 MCG/ACT nasal spray Place 2 sprays into both nostrils daily. 16 g 6   levocetirizine (XYZAL ) 5 MG tablet Take 1 tablet (5 mg total) by mouth every evening. 90 tablet 1   linaclotide  (LINZESS ) 290 MCG CAPS capsule Take 1 capsule (290 mcg total) by mouth daily before breakfast. 90 capsule 3   norgestimate -ethinyl estradiol  (ESTARYLLA) 0.25-35 MG-MCG tablet Take 1 tablet by mouth daily. 84 tablet 0   nortriptyline (PAMELOR) 10 MG capsule TAKE 10 MG (1 CAPSULE) BY MOUTH FOR ONE WEEK, THEN INCREASE TO 20 MG (2 CAPSULES) NIGHTLY FOR HEADACHES     ondansetron  (ZOFRAN ) 8 MG tablet Take 8 mg by mouth every 8 (eight) hours as needed for refractory nausea / vomiting.     polyethylene glycol powder (GLYCOLAX /MIRALAX ) 17 GM/SCOOP powder Take 17 g by mouth daily. 238 g 3   Rimegepant Sulfate (NURTEC) 75 MG TBDP Take 1 tablet (75 mg total) by mouth every other day. 15 tablet 5   sucralfate  (CARAFATE ) 1 GM/10ML suspension Take 10 mLs (1 g total) by mouth 4 (four) times daily. 420 mL 0   topiramate  (TOPAMAX ) 50 MG tablet Take 1 tablet (50 mg total) by mouth every evening. 30 tablet 5   traMADol  (ULTRAM ) 50 MG tablet ONE TAB PO BID PRN FOR PAIN AND HEADCAHES 60 tablet 2   triamcinolone  cream (KENALOG ) 0.1 % Apply 1 Application topically 2 (two) times daily as needed (itching and rash). 453.6 g 1   Vitamin D , Ergocalciferol , (DRISDOL ) 1.25 MG (50000 UNIT) CAPS capsule TAKE 1 CAPSULE BY MOUTH EVERY 7 DAYS 12 capsule 0   No current facility-administered medications for this visit.     Musculoskeletal: Strength & Muscle Tone: UTA Gait & Station: UTA Patient leans: N/A  Psychiatric Specialty Exam: Review of Systems  Psychiatric/Behavioral:  Negative.      Last menstrual period 12/06/2015.There is no height or weight on file to calculate BMI.  General Appearance: UTA  Eye Contact:  UTA  Speech:  Clear and Coherent  Volume:  Normal  Mood:  Euthymic  Affect:  UTA  Thought Process:  Goal Directed and Descriptions of Associations: Intact  Orientation:  Full (Time, Place, and Person)  Thought Content: Logical   Suicidal Thoughts:  No  Homicidal Thoughts:  No  Memory:  Immediate;   Fair Recent;   Fair Remote;   Fair  Judgement:  Fair  Insight:  Fair  Psychomotor Activity:  UTA  Concentration:  Concentration: Fair and Attention Span: Fair  Recall:  [employer] of Knowledge: Fair  Language: Fair  Akathisia:  No  Handed:  Right  AIMS (if indicated): UTA  Assets:  Manufacturing systems engineer Desire for Improvement Housing Social Support Transportation  ADL's:  Intact  Cognition: WNL  Sleep:  Fair   Screenings: AIMS    Flowsheet Row Video Visit from 01/20/2022 in [facility] Video Visit from 10/22/2021 in [facility]  AIMS Total Score 0 0   GAD-7    Flowsheet Row Counselor from 06/11/2023 in [facility] Office Visit from 02/24/2023 in [facility] Office Visit from 04/21/2022 in [facility] Video Visit from 01/20/2022  in [facility] Video Visit from 10/22/2021 in [facility]  Total GAD-7 Score 10 0 0 1 0   Mini-Mental    Flowsheet Row Office Visit from 01/07/2023 in [facility], [facility] Office Visit from 01/01/2022 in [facility] Clinical Support from 12/25/2020 in [facility] Clinical Support from 12/19/2019 in [facility] Office Visit from 10/13/2017 in [facility]   Total Score (max 30 points ) 29 29 29 30 27    PHQ2-9    Flowsheet Row Counselor from 06/11/2023 in [facility] Office Visit from 03/09/2023 in [facility], [facility] Office Visit from 02/24/2023 in [facility] Office Visit from 01/07/2023 in [facility], [facility] Office Visit from 04/21/2022 in [facility]  PHQ-2 Total Score 4 0 0 3 0  PHQ-9 Total Score 14 -- 5 7 0   Flowsheet Row Video Visit from 11/04/2023 in [facility] Video Visit from 08/02/2023 in [facility] Counselor from 06/11/2023 in [facility]  C-SSRS RISK CATEGORY No Risk No Risk No Risk     Assessment and Plan: Teresa Crawford is a 38 year old Caucasian female who has a history of MDD, PTSD, insomnia, currently with ongoing GI problems although with improvement, discussed assessment and plan as noted below.  Depression-stable Currently reports overall mood symptoms are stable. Continue Wellbutrin  XL 150 mg daily Discontinue Fluoxetine -no longer taking it.  PTSD-stable Currently reports overall trauma related symptoms as improved. Continue psychotherapy sessions as needed  Intellectual disability-chronic-mild She has good support system, community resources in the community living coach. Will reevaluate in future sessions.  Follow-up Follow-up in clinic in 3 months or sooner in person.   Collaboration of Care: Collaboration of Care: Referral or follow-up with counselor/therapist AEB continue psychotherapy sessions.    I have spent at least 15 minutes non face to face with patient today.   Patient/Guardian was advised Release of Information must be obtained prior to any record release in order to collaborate their care with an outside provider. Patient/Guardian was  advised if they have not already done so to contact the registration department to sign all necessary forms in order for us  to release information regarding their care.   Consent: Patient/Guardian gives verbal consent for treatment and assignment of benefits for services provided during this visit. Patient/Guardian expressed understanding and agreed to proceed.  This note was generated in part or whole with voice recognition software. Voice recognition is usually quite accurate but there are transcription errors that can and very often do occur. I apologize for any typographical errors that were not detected and corrected.     Jaeli Grubb, MD 11/05/2023, 8:18 AM

## 2023-11-04 NOTE — Progress Notes (Signed)
 [facility] [address] [place], [place] [address]  Internal MEDICINE  Office Visit Note  Patient Name: Teresa Crawford  [id number]  [id number]  Date of Service: 11/04/2023  Chief Complaint  Patient presents with   Depression   Follow-up    Review CT     HPI Teresa Crawford presents for a follow-up visit for kidney stone, headaches, constipation and delayed gastric emptying Headaches -- significantly improved when she started nortriptyline per Dr. Lane. Nurtec was discontinued since it was not helping Constipation -- seen by Dr. Unk - gastro, restarted on linzess  at 145 mcg daily, due to increase this dose since it is not hleping yet, her constipation issue is most likely slow transit constipation and OTC products do not help. Also some mild delayed gastric emptying, was given reglan  which has helped some  CT abd/pelvis -- no significant abnormalities except for a small 4 mm nonobstructive kidney stone in the left kidney   Current Medication: Outpatient Encounter Medications as of 11/04/2023  Medication Sig   linaclotide  (LINZESS ) 290 MCG CAPS capsule Take 1 capsule (290 mcg total) by mouth daily before breakfast.   acetaminophen  (TYLENOL ) 500 MG tablet Take 1 tablet (500 mg total) by mouth every 6 (six) hours as needed for mild pain (pain score 1-3) or moderate pain (pain score 4-6).   albuterol  (VENTOLIN  HFA) 108 (90 Base) MCG/ACT inhaler Inhale 1-2 puffs into the lungs every 6 (six) hours as needed for wheezing or shortness of breath.   benzonatate  (TESSALON ) 200 MG capsule Take 200 mg by mouth 2 (two) times daily as needed for cough.   buPROPion  (WELLBUTRIN  XL) 150 MG 24 hr tablet Take 1 tablet (150 mg total) by mouth in the morning. Stop Bupropion  75 mg   cetirizine  (ZYRTEC ) 10 MG tablet Take 1 tablet by mouth daily.   COMBIVENT  RESPIMAT 20-100 MCG/ACT AERS respimat Inhale 1 puff into the lungs every 6 (six) hours as needed for wheezing or shortness of breath.    cyanocobalamin  (VITAMIN B12) 1000 MCG tablet TAKE 1 TABLET BY MOUTH ONCE DAILY   diphenhydrAMINE  (BENADRYL ) 25 mg capsule Take 25 mg by mouth every 6 (six) hours as needed for allergies.   diphenoxylate -atropine  (LOMOTIL ) 2.5-0.025 MG tablet Take 1 tablet by mouth 4 (four) times daily as needed for diarrhea or loose stools.   docusate sodium  (COLACE) 100 MG capsule TAKE 1 CAPSULE BY MOUTH ONCE DAILY FOR STOOL SOFTENER   EPINEPHrine  0.3 mg/0.3 mL IJ SOAJ injection USE FOR ANAPHYLAXIS   fluticasone  (FLONASE ) 50 MCG/ACT nasal spray Place 2 sprays into both nostrils daily.   levocetirizine (XYZAL ) 5 MG tablet Take 1 tablet (5 mg total) by mouth every evening.   norgestimate -ethinyl estradiol  (ESTARYLLA) 0.25-35 MG-MCG tablet Take 1 tablet by mouth daily.   ondansetron  (ZOFRAN ) 8 MG tablet Take 8 mg by mouth every 8 (eight) hours as needed for refractory nausea / vomiting.   polyethylene glycol powder (GLYCOLAX /MIRALAX ) 17 GM/SCOOP powder Take 17 g by mouth daily.   promethazine  (PHENERGAN ) 12.5 MG suppository Place 12.5 mg rectally every 6 (six) hours as needed for nausea or vomiting.   Rimegepant Sulfate (NURTEC) 75 MG TBDP Take 1 tablet (75 mg total) by mouth every other day.   sucralfate  (CARAFATE ) 1 GM/10ML suspension Take 10 mLs (1 g total) by mouth 4 (four) times daily.   topiramate  (TOPAMAX ) 50 MG tablet Take 1 tablet (50 mg total) by mouth every evening.   traMADol  (ULTRAM ) 50 MG tablet ONE TAB PO BID  PRN FOR PAIN AND HEADCAHES   triamcinolone  cream (KENALOG ) 0.1 % Apply 1 Application topically 2 (two) times daily as needed (itching and rash).   Vitamin D , Ergocalciferol , (DRISDOL ) 1.25 MG (50000 UNIT) CAPS capsule TAKE 1 CAPSULE BY MOUTH EVERY 7 DAYS   Vonoprazan Fumarate  (VOQUEZNA ) 10 MG TABS Take 10 tablets by mouth daily.   [DISCONTINUED] bisacodyl  (DULCOLAX) 10 MG suppository Place 1 suppository (10 mg total) rectally daily as needed for moderate constipation or severe constipation.    [DISCONTINUED] linaclotide  (LINZESS ) 145 MCG CAPS capsule Take 1 capsule (145 mcg total) by mouth daily before breakfast.   No facility-administered encounter medications on file as of 11/04/2023.    Surgical History: Past Surgical History:  Procedure Laterality Date   ABDOMINAL HYSTERECTOMY  06/03/2016   BACK SURGERY     BIOPSY  04/29/2023   Procedure: BIOPSY;  Surgeon: Unk Teresa Skiff, MD;  Location: [facility] ENDOSCOPY;  Service: Gastroenterology;;   COLONOSCOPY  04/27/2006   Wohl-colitis, internal hemorrhoids   COLONOSCOPY  02/2013   focal right colitis, left colon biopsy negative, focal active proctitis without chronicity   COLONOSCOPY WITH PROPOFOL  N/A 07/16/2022   Procedure: COLONOSCOPY WITH PROPOFOL ;  Surgeon: Unk Teresa Skiff, MD;  Location: [facility] ENDOSCOPY;  Service: Gastroenterology;  Laterality: N/A;   ESOPHAGOGASTRODUODENOSCOPY  02/2013   multiple peptic & duodenal ulcers, negative h pylori   ESOPHAGOGASTRODUODENOSCOPY (EGD) WITH PROPOFOL  N/A 04/29/2023   Procedure: ESOPHAGOGASTRODUODENOSCOPY (EGD) WITH PROPOFOL ;  Surgeon: Unk Teresa Skiff, MD;  Location: [facility] ENDOSCOPY;  Service: Gastroenterology;  Laterality: N/A;   INTRAUTERINE DEVICE INSERTION  2012   LAPAROSCOPIC ENDOMETRIOSIS FULGURATION  2015   Teresa Crawford   OVARIAN CYST REMOVAL      Medical History: Past Medical History:  Diagnosis Date   B12 deficiency anemia    Chronic back pain    Chronic fatigue syndrome    Constipation    Depression    Elevated liver enzymes    Endometriosis    Fibroids    Hepatic steatosis    Hypothyroid    Last menstrual period (LMP) > 10 days ago 09/12/15   Mental retardation    Migraine    Post traumatic stress disorder    Posttraumatic stress disorder    PUD (peptic ulcer disease)    Scoliosis    Splenomegaly    Uterine fibroid     Family History: Family History  Problem Relation Age of Onset   Breast cancer Mother 62   COPD Father    Lung cancer Father 67   Dementia  Father    Diabetes Sister    Diabetes Brother    Lymphoma Maternal Grandmother 45   Colon cancer Maternal Grandfather 110   Stomach cancer Paternal Grandmother    Lung cancer Paternal Grandfather    Liver disease Maternal Aunt        ?etiology   Ovarian cancer Paternal Aunt        46s   Lung cancer Other 21       maternal great grandfather   Stomach cancer Other 59       maternal great grandmother    Social History   Socioeconomic History   Marital status: Single    Spouse name: Not on file   Number of children: 0   Years of education: Not on file   Highest education level: Not on file  Occupational History   Not on file  Tobacco Use   Smoking status: Never   Smokeless tobacco: Never  Vaping  Use   Vaping status: Never Used  Substance and Sexual Activity   Alcohol use: Not Currently    Alcohol/week: 0.0 standard drinks of alcohol   Drug use: No   Sexual activity: Not Currently  Other Topics Concern   Not on file  Social History Narrative   She has brother, half brother & sister live nearby   Both parents deceased   Care managed by El[facility]   Social Drivers of Health   Financial Resource Strain: Low Risk  (10/14/2023)   Received from [facility]   Overall Financial Resource Strain (CARDIA)    Difficulty of Paying Living Expenses: Not very hard  Food Insecurity: No Food Insecurity (10/14/2023)   Received from [facility]   Hunger Vital Sign    Within the past 12 months, you worried that your food would run out before you got the money to buy more.: Never true    Within the past 12 months, the food you bought just didn't last and you didn't have money to get more.: Never true  Transportation Needs: No Transportation Needs (10/14/2023)   Received from [facility] - [facility]    In the past 12 months, has lack of transportation kept you from medical appointments or from getting medications?: No     Lack of Transportation (Non-Medical): No  Physical Activity: Inactive (02/11/2017)   Exercise Vital Sign    Days of Exercise per Week: 0 days    Minutes of Exercise per Session: 0 min  Stress: No Stress Concern Present (02/11/2017)   [employer] of Occupational [employer] - Occupational Stress Questionnaire    Feeling of Stress : Not at all  Social Connections: Unknown (02/11/2017)   Social Connection and Isolation Panel    Frequency of Communication with Friends and Family: Patient declined    Frequency of Social Gatherings with Friends and Family: Patient declined    Attends Religious Services: Patient declined    Database administrator or Organizations: Patient declined    Attends Banker Meetings: Patient declined    Marital Status: Patient declined  Intimate Partner Violence: Unknown (02/11/2017)   Humiliation, Afraid, Rape, and Kick questionnaire    Fear of Current or Ex-Partner: Patient declined    Emotionally Abused: Patient declined    Physically Abused: Patient declined    Sexually Abused: Patient declined      Review of Systems  Constitutional:  Negative for fatigue and fever.  HENT:  Negative for congestion, mouth sores and postnasal drip.   Respiratory:  Negative for cough, chest tightness, shortness of breath and wheezing.   Cardiovascular:  Negative for chest pain and palpitations.  Gastrointestinal:  Positive for abdominal distention, abdominal pain, constipation, nausea and vomiting. Negative for diarrhea.       Bloating, early satiety  Genitourinary:  Negative for flank pain.  Psychiatric/Behavioral: Negative.      Vital Signs: BP 120/84   Pulse 86   Temp (!) 97.4 F (36.3 C)   Resp 16   Ht 5' 3 (1.6 m)   Wt 192 lb 12.8 oz (87.5 kg)   LMP 12/06/2015   SpO2 98%   BMI 34.15 kg/m    Physical Exam Vitals reviewed.  Constitutional:      General: She is not in acute distress.    Appearance: Normal appearance. She is obese. She is not  ill-appearing or diaphoretic.  HENT:     Head: Normocephalic and atraumatic.  Eyes:     Pupils: Pupils are equal, round, and reactive to light.  Cardiovascular:     Rate and Rhythm: Normal rate and regular rhythm.  Pulmonary:     Effort: Pulmonary effort is normal. No respiratory distress.  Abdominal:     General: Bowel sounds are normal. There is distension.     Palpations: Abdomen is soft. There is no mass.     Tenderness: There is abdominal tenderness. There is no guarding or rebound.     Hernia: No hernia is present.  Neurological:     Mental Status: She is alert and oriented to person, place, and time.  Psychiatric:        Mood and Affect: Mood normal.        Behavior: Behavior normal.        Assessment/Plan: 1. Slow transit constipation (Primary) Linzess  dose increased, follow up with GI - linaclotide  (LINZESS ) 290 MCG CAPS capsule; Take 1 capsule (290 mcg total) by mouth daily before breakfast.  Dispense: 90 capsule; Refill: 3  2. Delayed gastric emptying Continue reglan  as prescribed.   3. Vestibular migraine Continue nortriptyline as prescribed and follow up with neurology  4. Gastroesophageal reflux disease without esophagitis Continue medications as prescribed and follow up with GI   General Counseling: Rosina oakland understanding of the findings of todays visit and agrees with plan of treatment. I have discussed any further diagnostic evaluation that may be needed or ordered today. We also reviewed her medications today. she has been encouraged to call the office with any questions or concerns that should arise related to todays visit.    No orders of the defined types were placed in this encounter.   Meds ordered this encounter  Medications   linaclotide  (LINZESS ) 290 MCG CAPS capsule    Sig: Take 1 capsule (290 mcg total) by mouth daily before breakfast.    Dispense:  90 capsule    Refill:  3    Note increased dose, fill new script today,  discontinue 145 mcg dose.    Return for AWV in october for next visit .   Total time spent:30 Minutes Time spent includes review of chart, medications, test results, and follow up plan with the patient.   [place] Controlled Substance Database was reviewed by me.  This patient was seen by Mardy Maxin, FNP-C in collaboration with Dr. Sigrid Bathe as a part of collaborative care agreement.   Ayrabella Labombard R. Maxin, MSN, FNP-C Internal medicine

## 2023-11-06 ENCOUNTER — Encounter: Payer: Self-pay | Admitting: Nurse Practitioner

## 2023-11-09 ENCOUNTER — Ambulatory Visit: Admitting: Nurse Practitioner

## 2023-11-26 ENCOUNTER — Encounter: Payer: Self-pay | Admitting: Licensed Clinical Social Worker

## 2023-11-26 ENCOUNTER — Ambulatory Visit (INDEPENDENT_AMBULATORY_CARE_PROVIDER_SITE_OTHER): Admitting: Licensed Clinical Social Worker

## 2023-11-26 ENCOUNTER — Other Ambulatory Visit: Payer: Self-pay | Admitting: Nurse Practitioner

## 2023-11-26 DIAGNOSIS — F4310 Post-traumatic stress disorder, unspecified: Secondary | ICD-10-CM

## 2023-11-26 DIAGNOSIS — F330 Major depressive disorder, recurrent, mild: Secondary | ICD-10-CM | POA: Diagnosis not present

## 2023-11-26 DIAGNOSIS — Z79899 Other long term (current) drug therapy: Secondary | ICD-10-CM

## 2023-11-26 NOTE — Progress Notes (Signed)
 THERAPIST PROGRESS NOTE  Virtual Visit via Video Note  I connected with Teresa Crawford on 11/26/23 at 11:00 AM EDT by a video enabled telemedicine application and verified that I am speaking with the correct person using two identifiers.  Location: Patient: Address on file  Provider: Providers address   I discussed the limitations of evaluation and management by telemedicine and the availability of in person appointments. The patient expressed understanding and agreed to proceed.  I discussed the assessment and treatment plan with the patient. The patient was provided an opportunity to ask questions and all were answered. The patient agreed with the plan and demonstrated an understanding of the instructions.   The patient was advised to call back or seek an in-person evaluation if the symptoms worsen or if the condition fails to improve as anticipated.  I provided 48 minutes of non-face-to-face time during this encounter.   [name], LCSW   Session Time: 11-11:48 am  Participation Level: Active  Behavioral Response: Casual and NeatAlertDepressed  Type of Therapy: Individual Therapy  Treatment Goals addressed:  Active     Anxiety     LTG: Donneisha will score less than 5 on the Generalized Anxiety Disorder 7 Scale (GAD-7)      Start:  10/15/23         STG: Mirel will practice problem solving skills 3 times per week for the next 4 weeks.      Start:  10/15/23         Perform psychoeducation regarding anxiety disorders     Start:  10/15/23         Provide Teresa with educational information and reading material on anxiety, its causes, and symptoms.      Start:  10/15/23         Coping Skills     Start:  10/15/23       Will work with the pt using CBT/DBT techniques to help the pt verbalize an understanding of the cognitive, physiological, and behavioral components of anxiety and its treatment. This will be done by using worksheets, interactive  activities, CBT/ABC thought logs, modeling, homework, role playing and journaling. Will work with pt to learn and implement coping skills that result in a reduction of anxiety and improve daily functioning per pt self-report 3 out of 5 documented sessions.          ProgressTowards Goals: Progressing  Interventions: Supportive and Other: Grief Work   Summary: DEZARIA METHOT is a 38 y.o. female who presents with symptoms of anxiety and bereavement. Patient identifies symptoms to include uncontrollable worry, negative self affect, low mood, and fatigue. Pt was oriented times 5. Pt was cooperative and engaged. Pt denies SI/HI/AVH.   The patient reports that she recently lost her ex-boyfriend one month ago. She feels that she has given herself the time to grieve. She mentions that she was sleeping more initially, but now she is back to not sleeping at night due to racing thoughts about his death. She shares that she is struggling with the inability to see him, especially since she used to see him every day. The patient affirms that she is doing everything within her control to care for herself during this difficult time.   Shares she has been coping by writting letters to her loved one as well as journaling about her feelings. Reports she has been disucssing her feelings with her support system. Copes by honoring him through her art work and leaning into her  spirituality.   We reflected on ways to cope with her restless nights and discussed sleep hygiene practices and possible solutions to address her restlessness.  Cln shared resources on local grief groups.  Suicidal/Homicidal: Nowithout intent/plan  Therapist Response: Clinician utilized active and supportive reflection to create a safe environment for patient to process recent life stressors. Clinician assessed for current stressors, symptoms, and safety since last session. Processed patients progress through the stages of grief. Identified  coping skills and solutions to improve sleep hygiene.   Plan: Return again in 4 weeks.  Diagnosis: Mild episode of recurrent major depressive disorder (HCC)  Posttraumatic stress disorder   Collaboration of Care: AEB psychiatrist can access notes and cln. Will review psychiatrists' notes. Check in with the patient and will see LCSW per availability. Patient agreed with treatment recommendations.   Patient/Guardian was advised Release of Information must be obtained prior to any record release in order to collaborate their care with an outside provider. Patient/Guardian was advised if they have not already done so to contact the registration department to sign all necessary forms in order for us  to release information regarding their care.   Consent: Patient/Guardian gives verbal consent for treatment and assignment of benefits for services provided during this visit. Patient/Guardian expressed understanding and agreed to proceed.   [name], LCSW 11/26/2023

## 2023-11-30 DIAGNOSIS — Z87898 Personal history of other specified conditions: Secondary | ICD-10-CM | POA: Diagnosis not present

## 2023-11-30 DIAGNOSIS — G43809 Other migraine, not intractable, without status migrainosus: Secondary | ICD-10-CM | POA: Diagnosis not present

## 2023-11-30 DIAGNOSIS — R42 Dizziness and giddiness: Secondary | ICD-10-CM | POA: Diagnosis not present

## 2023-12-06 DIAGNOSIS — H6691 Otitis media, unspecified, right ear: Secondary | ICD-10-CM | POA: Diagnosis not present

## 2023-12-06 DIAGNOSIS — Z20822 Contact with and (suspected) exposure to covid-19: Secondary | ICD-10-CM | POA: Diagnosis not present

## 2023-12-16 DIAGNOSIS — R569 Unspecified convulsions: Secondary | ICD-10-CM | POA: Diagnosis not present

## 2023-12-17 ENCOUNTER — Other Ambulatory Visit: Payer: Self-pay | Admitting: Obstetrics

## 2023-12-17 DIAGNOSIS — R102 Pelvic and perineal pain: Secondary | ICD-10-CM

## 2023-12-17 DIAGNOSIS — R569 Unspecified convulsions: Secondary | ICD-10-CM | POA: Diagnosis not present

## 2023-12-17 DIAGNOSIS — Z8742 Personal history of other diseases of the female genital tract: Secondary | ICD-10-CM

## 2023-12-20 ENCOUNTER — Other Ambulatory Visit: Payer: Self-pay | Admitting: Obstetrics

## 2023-12-20 DIAGNOSIS — R102 Pelvic and perineal pain: Secondary | ICD-10-CM

## 2023-12-20 DIAGNOSIS — Z8742 Personal history of other diseases of the female genital tract: Secondary | ICD-10-CM

## 2023-12-27 ENCOUNTER — Ambulatory Visit (INDEPENDENT_AMBULATORY_CARE_PROVIDER_SITE_OTHER): Admitting: Licensed Clinical Social Worker

## 2023-12-27 DIAGNOSIS — F330 Major depressive disorder, recurrent, mild: Secondary | ICD-10-CM | POA: Diagnosis not present

## 2023-12-27 DIAGNOSIS — F4310 Post-traumatic stress disorder, unspecified: Secondary | ICD-10-CM | POA: Diagnosis not present

## 2023-12-27 NOTE — Progress Notes (Signed)
 THERAPIST PROGRESS NOTE  Virtual Visit via Video Note  I connected with Teresa Crawford Crawford on 12/27/23 at  2:00 PM EDT by a video enabled telemedicine application and verified that I am speaking with the correct person using two identifiers.  Location: Patient: Address on file Provider: Providers address   I discussed the limitations of evaluation and management by telemedicine and the availability of in person appointments. The patient expressed understanding and agreed to proceed.   I discussed the assessment and treatment plan with the patient. The patient was provided an opportunity to ask questions and all were answered. The patient agreed with the plan and demonstrated an understanding of the instructions.   The patient was advised to call back or seek an in-person evaluation if the symptoms worsen or if the condition fails to improve as anticipated.  I provided 44 minutes of non-face-to-face time during this encounter.   Teresa Crawford KATHEE Husband, LCSW   Session Time: 2-2:44  pm  Participation Level: Active  Behavioral Response: CasualAlertEuthymic  Type of Therapy: Individual Therapy  Treatment Goals addressed:  Active     Anxiety     LTG: Teresa Crawford Crawford will score less than 5 on the Generalized Anxiety Disorder 7 Scale (GAD-7)  (Progressing)     Start:  10/15/23    Expected End:  02/26/24         STG: Teresa Crawford will practice problem solving skills 3 times per week for the next 4 weeks.  (Progressing)     Start:  10/15/23    Expected End:  02/26/24         Perform psychoeducation regarding anxiety disorders     Start:  10/15/23         Provide Teresa Crawford with educational information and reading material on anxiety, its causes, and symptoms.      Start:  10/15/23         Coping Skills     Start:  10/15/23       Will work with the pt using CBT/DBT techniques to help the pt verbalize an understanding of the cognitive, physiological, and behavioral components of anxiety  and its treatment. This will be done by using worksheets, interactive activities, CBT/ABC thought logs, modeling, homework, role playing and journaling. Will work with pt to learn and implement coping skills that result in a reduction of anxiety and improve daily functioning per pt self-report 3 out of 5 documented sessions.         ProgressTowards Goals: Progressing  Interventions: Supportive and Other: Grief work  Summary:  Teresa Crawford Crawford is a 38 y.o. female who presents with symptoms of anxiety and bereavement. Patient identifies symptoms to include uncontrollable worry, negative self affect, low mood, and fatigue. Pt was oriented times 5. Pt was cooperative and engaged. Pt denies SI/HI/AVH.   The patient reflected on the process of reframing her misplaced blame.   She discussed her grief and the ways she is coping, reporting that she has been spending time alone and engaging in hobbies she performed with loved ones. The patient reflected on her progress with grief and her past experiences of mourning the loss of loved ones. She shared new traditions she is incorporating into her routine in honor of those she has lost.  Patient expressed excitement about beginning a new job.   Suicidal/Homicidal: Nowithout intent/plan  Therapist Response: Clinician utilized active and supportive reflection to create a safe environment for patient to process recent life stressors. Clinician assessed for current stressors, symptoms,  and safety since last session. Reflected on the patients progress though grief.Reviewed self care.   Plan: Return again in 2 weeks.  Diagnosis: Mild episode of recurrent major depressive disorder (HCC)  Posttraumatic stress disorder   Collaboration of Care:  AEB psychiatrist can access notes and cln. Will review psychiatrists' notes. Check in with the patient and will see LCSW per availability. Patient agreed with treatment recommendations.   Patient/Guardian was  advised Release of Information must be obtained prior to any record release in order to collaborate their care with an outside provider. Patient/Guardian was advised if they have not already done so to contact the registration department to sign all necessary forms in order for us  to release information regarding their care.   Consent: Patient/Guardian gives verbal consent for treatment and assignment of benefits for services provided during this visit. Patient/Guardian expressed understanding and agreed to proceed.   Teresa Crawford KATHEE Husband, LCSW 12/27/2023

## 2023-12-28 ENCOUNTER — Other Ambulatory Visit: Payer: Self-pay | Admitting: Nurse Practitioner

## 2023-12-28 ENCOUNTER — Other Ambulatory Visit: Payer: Self-pay | Admitting: Psychiatry

## 2023-12-28 DIAGNOSIS — Z79899 Other long term (current) drug therapy: Secondary | ICD-10-CM

## 2023-12-28 DIAGNOSIS — F4310 Post-traumatic stress disorder, unspecified: Secondary | ICD-10-CM

## 2023-12-28 DIAGNOSIS — F3342 Major depressive disorder, recurrent, in full remission: Secondary | ICD-10-CM

## 2024-01-13 ENCOUNTER — Ambulatory Visit: Payer: Medicare Other | Admitting: Nurse Practitioner

## 2024-01-13 ENCOUNTER — Encounter: Payer: Self-pay | Admitting: Nurse Practitioner

## 2024-01-13 VITALS — BP 126/80 | HR 84 | Temp 96.1°F | Resp 16 | Ht 63.0 in | Wt 188.8 lb

## 2024-01-13 DIAGNOSIS — Z0000 Encounter for general adult medical examination without abnormal findings: Secondary | ICD-10-CM

## 2024-01-13 DIAGNOSIS — E559 Vitamin D deficiency, unspecified: Secondary | ICD-10-CM | POA: Diagnosis not present

## 2024-01-13 DIAGNOSIS — K219 Gastro-esophageal reflux disease without esophagitis: Secondary | ICD-10-CM | POA: Diagnosis not present

## 2024-01-13 DIAGNOSIS — E538 Deficiency of other specified B group vitamins: Secondary | ICD-10-CM | POA: Diagnosis not present

## 2024-01-13 DIAGNOSIS — Z23 Encounter for immunization: Secondary | ICD-10-CM | POA: Diagnosis not present

## 2024-01-13 DIAGNOSIS — J301 Allergic rhinitis due to pollen: Secondary | ICD-10-CM | POA: Diagnosis not present

## 2024-01-13 DIAGNOSIS — Z90710 Acquired absence of both cervix and uterus: Secondary | ICD-10-CM | POA: Diagnosis not present

## 2024-01-13 DIAGNOSIS — G43809 Other migraine, not intractable, without status migrainosus: Secondary | ICD-10-CM | POA: Diagnosis not present

## 2024-01-13 MED ORDER — FLUTICASONE PROPIONATE 50 MCG/ACT NA SUSP: 2.0000 | Freq: Every day | NASAL | 6 refills | Status: AC

## 2024-01-13 MED ORDER — VITAMIN D (ERGOCALCIFEROL) 1.25 MG (50000 UNIT) PO CAPS
50000.0000 [IU] | ORAL_CAPSULE | ORAL | 0 refills | Status: DC
Start: 2024-01-13 — End: 2024-02-16

## 2024-01-13 MED ORDER — COMBIVENT RESPIMAT 20-100 MCG/ACT IN AERS: 1.0000 | INHALATION_SPRAY | Freq: Four times a day (QID) | RESPIRATORY_TRACT | 5 refills | Status: AC | PRN

## 2024-01-13 MED ORDER — LEVOCETIRIZINE DIHYDROCHLORIDE 5 MG PO TABS: 5.0000 mg | ORAL_TABLET | Freq: Every evening | ORAL | 1 refills | Status: AC

## 2024-01-13 MED ORDER — ACETAMINOPHEN 500 MG PO TABS: 500.0000 mg | ORAL_TABLET | Freq: Four times a day (QID) | ORAL | 5 refills | Status: AC | PRN

## 2024-01-13 MED ORDER — VITAMIN B-12 1000 MCG PO TABS: 1000.0000 ug | ORAL_TABLET | Freq: Every day | ORAL | 2 refills | Status: AC

## 2024-01-13 MED ORDER — EPINEPHRINE 0.3 MG/0.3ML IJ SOAJ: INTRAMUSCULAR | 5 refills | Status: AC

## 2024-01-13 MED ORDER — ALBUTEROL SULFATE HFA 108 (90 BASE) MCG/ACT IN AERS
1.0000 | INHALATION_SPRAY | Freq: Four times a day (QID) | RESPIRATORY_TRACT | 5 refills | Status: AC | PRN
Start: 2024-01-13 — End: ?

## 2024-01-13 NOTE — Progress Notes (Signed)
 [facility] [address] [place], [place] [address]  Internal MEDICINE  Office Visit Note  Patient Name: Teresa Crawford  [id number]  [id number]  Date of Service: 01/13/2024  Chief Complaint  Patient presents with   Depression   Medicare Wellness    HPI Teresa Crawford presents for a medicare annual wellness visit.  Well-appearing 38 y.o. female with migraines, hypothyroidism, PTSD, depression, intellectual disability, and allergic rhinitis.  Routine CRC screening: not due yet  Routine mammogram: first one due next year.  DEXA scan: not due yet  Pap smear: discontinued, hysterectomy  Labs: due for routine labs.  New or worsening pain: none  Other concerns: none GI symptoms are improved, she denies any nausea, vomiting or diarrhea. She does have constipation but it is manageable with linzess .      01/13/2024   10:17 AM 01/07/2023   10:00 AM 01/01/2022    9:59 AM  MMSE - Mini Mental State Exam  Orientation to time 5 5 5   Orientation to Place 5 5 5   Registration 3 3 3   Attention/ Calculation 5 5 5   Recall 3 3 3   Language- name 2 objects 2 2 2   Language- repeat 1 1 1   Language- follow 3 step command 3 3 3   Language- read & follow direction 1 1 1   Write a sentence 0 0 0  Copy design 1 1 1   Total score 29 29 29     Functional Status Survey: Is the patient deaf or have difficulty hearing?: No Does the patient have difficulty seeing, even when wearing glasses/contacts?: No Does the patient have difficulty concentrating, remembering, or making decisions?: No Does the patient have difficulty walking or climbing stairs?: No Does the patient have difficulty dressing or bathing?: No Does the patient have difficulty doing errands alone such as visiting a doctor's office or shopping?: No     04/30/2022    9:44 AM 01/07/2023    9:59 AM 03/09/2023   12:03 PM 04/20/2023   10:58 AM 01/13/2024   10:16 AM  Fall Risk  Falls in the past year? 0 0 0 0 0  Was there an injury with  Fall? 0 0   0  Fall Risk Category Calculator 0 0   0  Patient at Risk for Falls Due to No Fall Risks No Fall Risks   No Fall Risks  Fall risk Follow up Falls evaluation completed  Falls evaluation completed   Falls evaluation completed     Data saved with a previous flowsheet row definition       06/11/2023   11:39 AM  Depression screen PHQ 2/9  Decreased Interest   Down, Depressed, Hopeless   PHQ - 2 Score   Altered sleeping   Tired, decreased energy   Change in appetite   Feeling bad or failure about yourself    Trouble concentrating   Moving slowly or fidgety/restless   Suicidal thoughts   PHQ-9 Score   Difficult doing work/chores      Information is confidential and restricted. Go to Review Flowsheets to unlock data.       06/11/2023   11:43 AM 02/24/2023   11:34 AM 04/21/2022    4:12 PM 01/20/2022   10:33 AM  GAD 7 : Generalized Anxiety Score  Nervous, Anxious, on Edge      Control/stop worrying      Worry too much - different things      Trouble relaxing      Restless  Easily annoyed or irritable      Afraid - awful might happen      Total GAD 7 Score      Anxiety Difficulty         Information is confidential and restricted. Go to Review Flowsheets to unlock data.      Current Medication: Outpatient Encounter Medications as of 01/13/2024  Medication Sig   acetaminophen  (TYLENOL ) 500 MG tablet Take 1 tablet (500 mg total) by mouth every 6 (six) hours as needed for mild pain (pain score 1-3) or moderate pain (pain score 4-6).   albuterol  (VENTOLIN  HFA) 108 (90 Base) MCG/ACT inhaler Inhale 1-2 puffs into the lungs every 6 (six) hours as needed for wheezing or shortness of breath.   benzonatate  (TESSALON ) 200 MG capsule Take 200 mg by mouth 2 (two) times daily as needed for cough.   buPROPion  (WELLBUTRIN  XL) 150 MG 24 hr tablet TAKE 1 TABLET BY MOUTH IN THE MORNING   COMBIVENT  RESPIMAT 20-100 MCG/ACT AERS respimat Inhale 1 puff into the lungs every 6 (six)  hours as needed for wheezing or shortness of breath.   cyanocobalamin  (VITAMIN B12) 1000 MCG tablet Take 1 tablet (1,000 mcg total) by mouth daily.   diphenhydrAMINE  (BENADRYL ) 25 mg capsule Take 25 mg by mouth every 6 (six) hours as needed for allergies.   diphenoxylate -atropine  (LOMOTIL ) 2.5-0.025 MG tablet Take 1 tablet by mouth 4 (four) times daily as needed for diarrhea or loose stools.   docusate sodium  (COLACE) 100 MG capsule TAKE 1 CAPSULE BY MOUTH ONCE DAILY FOR STOOL SOFTENER   EPINEPHrine  0.3 mg/0.3 mL IJ SOAJ injection USE FOR ANAPHYLAXIS   fluticasone  (FLONASE ) 50 MCG/ACT nasal spray Place 2 sprays into both nostrils daily.   levocetirizine (XYZAL ) 5 MG tablet Take 1 tablet (5 mg total) by mouth every evening.   linaclotide  (LINZESS ) 290 MCG CAPS capsule Take 1 capsule (290 mcg total) by mouth daily before breakfast.   nortriptyline (PAMELOR) 10 MG capsule TAKE 10 MG (1 CAPSULE) BY MOUTH FOR ONE WEEK, THEN INCREASE TO 20 MG (2 CAPSULES) NIGHTLY FOR HEADACHES   ondansetron  (ZOFRAN ) 8 MG tablet Take 8 mg by mouth every 8 (eight) hours as needed for refractory nausea / vomiting.   polyethylene glycol powder (GLYCOLAX /MIRALAX ) 17 GM/SCOOP powder Take 17 g by mouth daily.   SPRINTEC 28 0.25-35 MG-MCG tablet TAKE 1 TABLET BY MOUTH ONCE DAILY   topiramate  (TOPAMAX ) 50 MG tablet TAKE 1 TABLET BY MOUTH EVERY EVENING   traMADol  (ULTRAM ) 50 MG tablet ONE TAB PO BID PRN FOR PAIN AND HEADCAHES   triamcinolone  cream (KENALOG ) 0.1 % Apply 1 Application topically 2 (two) times daily as needed (itching and rash).   Vitamin D , Ergocalciferol , (DRISDOL ) 1.25 MG (50000 UNIT) CAPS capsule Take 1 capsule (50,000 Units total) by mouth every 7 (seven) days.   [DISCONTINUED] acetaminophen  (TYLENOL ) 500 MG tablet Take 1 tablet (500 mg total) by mouth every 6 (six) hours as needed for mild pain (pain score 1-3) or moderate pain (pain score 4-6).   [DISCONTINUED] albuterol  (VENTOLIN  HFA) 108 (90 Base) MCG/ACT  inhaler Inhale 1-2 puffs into the lungs every 6 (six) hours as needed for wheezing or shortness of breath.   [DISCONTINUED] cetirizine  (ZYRTEC ) 10 MG tablet Take 1 tablet by mouth daily.   [DISCONTINUED] COMBIVENT  RESPIMAT 20-100 MCG/ACT AERS respimat Inhale 1 puff into the lungs every 6 (six) hours as needed for wheezing or shortness of breath.   [DISCONTINUED] cyanocobalamin  (VITAMIN B12) 1000 MCG tablet  TAKE 1 TABLET BY MOUTH ONCE DAILY   [DISCONTINUED] EPINEPHrine  0.3 mg/0.3 mL IJ SOAJ injection USE FOR ANAPHYLAXIS   [DISCONTINUED] fluticasone  (FLONASE ) 50 MCG/ACT nasal spray Place 2 sprays into both nostrils daily.   [DISCONTINUED] levocetirizine (XYZAL ) 5 MG tablet Take 1 tablet (5 mg total) by mouth every evening.   [DISCONTINUED] Rimegepant Sulfate (NURTEC) 75 MG TBDP Take 1 tablet (75 mg total) by mouth every other day.   [DISCONTINUED] sucralfate  (CARAFATE ) 1 GM/10ML suspension Take 10 mLs (1 g total) by mouth 4 (four) times daily.   [DISCONTINUED] Vitamin D , Ergocalciferol , (DRISDOL ) 1.25 MG (50000 UNIT) CAPS capsule TAKE 1 CAPSULE BY MOUTH EVERY 7 DAYS   No facility-administered encounter medications on file as of 01/13/2024.    Surgical History: Past Surgical History:  Procedure Laterality Date   ABDOMINAL HYSTERECTOMY  06/03/2016   BACK SURGERY     BIOPSY  04/29/2023   Procedure: BIOPSY;  Surgeon: Unk Corinn Skiff, MD;  Location: [facility] ENDOSCOPY;  Service: Gastroenterology;;   COLONOSCOPY  04/27/2006   Wohl-colitis, internal hemorrhoids   COLONOSCOPY  02/2013   focal right colitis, left colon biopsy negative, focal active proctitis without chronicity   COLONOSCOPY WITH PROPOFOL  N/A 07/16/2022   Procedure: COLONOSCOPY WITH PROPOFOL ;  Surgeon: Unk Corinn Skiff, MD;  Location: [facility] ENDOSCOPY;  Service: Gastroenterology;  Laterality: N/A;   ESOPHAGOGASTRODUODENOSCOPY  02/2013   multiple peptic & duodenal ulcers, negative h pylori   ESOPHAGOGASTRODUODENOSCOPY (EGD) WITH  PROPOFOL  N/A 04/29/2023   Procedure: ESOPHAGOGASTRODUODENOSCOPY (EGD) WITH PROPOFOL ;  Surgeon: Unk Corinn Skiff, MD;  Location: [facility] ENDOSCOPY;  Service: Gastroenterology;  Laterality: N/A;   INTRAUTERINE DEVICE INSERTION  2012   LAPAROSCOPIC ENDOMETRIOSIS FULGURATION  2015   [name]   OVARIAN CYST REMOVAL      Medical History: Past Medical History:  Diagnosis Date   B12 deficiency anemia    Chronic back pain    Chronic fatigue syndrome    Constipation    Depression    Elevated liver enzymes    Endometriosis    Fibroids    Hepatic steatosis    Hypothyroid    Last menstrual period (LMP) > 10 days ago 09/12/15   Mental retardation    Migraine    Post traumatic stress disorder    Posttraumatic stress disorder    PUD (peptic ulcer disease)    Scoliosis    Splenomegaly    Uterine fibroid     Family History: Family History  Problem Relation Age of Onset   Breast cancer Mother 8   COPD Father    Lung cancer Father 35   Dementia Father    Diabetes Sister    Diabetes Brother    Lymphoma Maternal Grandmother 14   Colon cancer Maternal Grandfather 58   Stomach cancer Paternal Grandmother    Lung cancer Paternal Grandfather    Liver disease Maternal Aunt        ?etiology   Ovarian cancer Paternal Aunt        88s   Lung cancer Other 34       maternal great grandfather   Stomach cancer Other 58       maternal great grandmother    Social History   Socioeconomic History   Marital status: Single    Spouse name: Not on file   Number of children: 0   Years of education: Not on file   Highest education level: Not on file  Occupational History   Not on file  Tobacco Use  Smoking status: Never   Smokeless tobacco: Never  Vaping Use   Vaping status: Never Used  Substance and Sexual Activity   Alcohol use: Not Currently    Alcohol/week: 0.0 standard drinks of alcohol   Drug use: No   Sexual activity: Not Currently  Other Topics Concern   Not on file  Social  History Narrative   She has brother, half brother & sister live nearby   Both parents deceased   Care managed by Elgin Hamilton   Social Drivers of Health   Financial Resource Strain: Low Risk  (10/14/2023)   Received from [facility]   Overall Financial Resource Strain (CARDIA)    Difficulty of Paying Living Expenses: Not very hard  Food Insecurity: No Food Insecurity (10/14/2023)   Received from [facility]   Hunger Vital Sign    Within the past 12 months, you worried that your food would run out before you got the money to buy more.: Never true    Within the past 12 months, the food you bought just didn't last and you didn't have money to get more.: Never true  Transportation Needs: No Transportation Needs (10/14/2023)   Received from [facility] - Transportation    In the past 12 months, has lack of transportation kept you from medical appointments or from getting medications?: No    Lack of Transportation (Non-Medical): No  Physical Activity: Inactive (02/11/2017)   Exercise Vital Sign    Days of Exercise per Week: 0 days    Minutes of Exercise per Session: 0 min  Stress: No Stress Concern Present (02/11/2017)   [employer] - Occupational Stress Questionnaire    Feeling of Stress : Not at all  Social Connections: Unknown (02/11/2017)   Social Connection and Isolation Panel    Frequency of Communication with Friends and Family: Patient declined    Frequency of Social Gatherings with Friends and Family: Patient declined    Attends Religious Services: Patient declined    Database administrator or Organizations: Patient declined    Attends Banker Meetings: Patient declined    Marital Status: Patient declined  Intimate Partner Violence: Unknown (02/11/2017)   Humiliation, Afraid, Rape, and Kick questionnaire    Fear of Current or Ex-Partner: Patient declined    Emotionally Abused:  Patient declined    Physically Abused: Patient declined    Sexually Abused: Patient declined      Review of Systems  Constitutional:  Positive for appetite change and fatigue. Negative for activity change, chills, fever and unexpected weight change.  HENT:  Positive for congestion and postnasal drip. Negative for ear pain, rhinorrhea, sinus pain, sore throat and trouble swallowing.   Eyes: Negative.   Respiratory:  Positive for cough and shortness of breath. Negative for chest tightness and wheezing.   Cardiovascular: Negative.  Negative for chest pain and palpitations.  Gastrointestinal:  Positive for nausea and vomiting. Negative for abdominal pain, blood in stool, constipation and diarrhea.  Endocrine: Negative.   Genitourinary: Negative.  Negative for difficulty urinating, dysuria, frequency, hematuria and urgency.  Musculoskeletal: Negative.  Negative for arthralgias, back pain, joint swelling, myalgias and neck pain.  Skin: Negative.  Negative for rash and wound.  Allergic/Immunologic: Negative.  Negative for immunocompromised state.  Neurological:  Positive for dizziness and headaches. Negative for seizures and numbness.  Hematological: Negative.   Psychiatric/Behavioral:  Negative for behavioral problems, self-injury and suicidal ideas. The  patient is not nervous/anxious.     Vital Signs: BP 126/80   Pulse 84   Temp (!) 96.1 F (35.6 C)   Resp 16   Ht 5' 3 (1.6 m)   Wt 188 lb 12.8 oz (85.6 kg)   LMP 12/06/2015   SpO2 98%   BMI 33.44 kg/m    Physical Exam Vitals reviewed.  Constitutional:      General: She is awake. She is not in acute distress.    Appearance: Normal appearance. She is well-developed and well-groomed. She is obese. She is not ill-appearing or diaphoretic.  HENT:     Head: Normocephalic and atraumatic.     Right Ear: Tympanic membrane, ear canal and external ear normal.     Left Ear: Tympanic membrane, ear canal and external ear normal.     Nose:  Nose normal. No congestion or rhinorrhea.     Mouth/Throat:     Lips: Pink.     Mouth: Mucous membranes are moist.     Pharynx: Oropharynx is clear. Uvula midline. No oropharyngeal exudate or posterior oropharyngeal erythema.  Eyes:     General: Lids are normal. Vision grossly intact. Gaze aligned appropriately. No scleral icterus.       Right eye: No discharge.        Left eye: No discharge.     Extraocular Movements: Extraocular movements intact.     Conjunctiva/sclera: Conjunctivae normal.     Pupils: Pupils are equal, round, and reactive to light.     Funduscopic exam:    Right eye: Red reflex present.        Left eye: Red reflex present. Neck:     Thyroid : No thyromegaly.     Vascular: No carotid bruit or JVD.     Trachea: Trachea and phonation normal. No tracheal deviation.  Cardiovascular:     Rate and Rhythm: Normal rate and regular rhythm.     Pulses: Normal pulses.     Heart sounds: Normal heart sounds, S1 normal and S2 normal. No murmur heard.    No friction rub. No gallop.  Pulmonary:     Effort: Pulmonary effort is normal. No accessory muscle usage or respiratory distress.     Breath sounds: Normal breath sounds and air entry. No stridor. No wheezing or rales.  Chest:     Chest wall: No tenderness.  Abdominal:     General: Bowel sounds are normal. There is no distension.     Palpations: Abdomen is soft. There is no shifting dullness, fluid wave, mass or pulsatile mass.     Tenderness: There is no abdominal tenderness. There is no guarding or rebound.  Musculoskeletal:        General: No tenderness or deformity. Normal range of motion.     Cervical back: Normal range of motion and neck supple.     Right lower leg: No edema.     Left lower leg: No edema.  Lymphadenopathy:     Cervical: No cervical adenopathy.  Skin:    General: Skin is warm and dry.     Capillary Refill: Capillary refill takes less than 2 seconds.     Coloration: Skin is not pale.     Findings:  No erythema or rash.  Neurological:     Mental Status: She is alert and oriented to person, place, and time.     Cranial Nerves: No cranial nerve deficit.     Motor: No abnormal muscle tone.     Coordination: Coordination  normal.     Gait: Gait normal.     Deep Tendon Reflexes: Reflexes are normal and symmetric.  Psychiatric:        Mood and Affect: Mood and affect normal.        Behavior: Behavior normal. Behavior is cooperative.        Thought Content: Thought content normal.        Judgment: Judgment normal.        Assessment/Plan: 1. Encounter for subsequent annual wellness visit (AWV) in Medicare patient (Primary) Age-appropriate preventive screenings and vaccinations discussed Routine labs for health maintenance will be ordered. PHM updated.   - acetaminophen  (TYLENOL ) 500 MG tablet; Take 1 tablet (500 mg total) by mouth every 6 (six) hours as needed for mild pain (pain score 1-3) or moderate pain (pain score 4-6).  Dispense: 30 tablet; Refill: 5 - albuterol  (VENTOLIN  HFA) 108 (90 Base) MCG/ACT inhaler; Inhale 1-2 puffs into the lungs every 6 (six) hours as needed for wheezing or shortness of breath.  Dispense: 8 g; Refill: 5 - COMBIVENT  RESPIMAT 20-100 MCG/ACT AERS respimat; Inhale 1 puff into the lungs every 6 (six) hours as needed for wheezing or shortness of breath.  Dispense: 4 g; Refill: 5 - EPINEPHrine  0.3 mg/0.3 mL IJ SOAJ injection; USE FOR ANAPHYLAXIS  Dispense: 1 each; Refill: 5  2. Vestibular migraine Followed by neurology and is taking nortriptyline and topiramate .   3. Gastroesophageal reflux disease without esophagitis Symptoms are greatly improved. Continue medications as prescribed and continue to follow up with GI  4. Seasonal allergic rhinitis due to pollen Continue fluticasone  and levocetirizine as prescribed.  - fluticasone  (FLONASE ) 50 MCG/ACT nasal spray; Place 2 sprays into both nostrils daily.  Dispense: 16 g; Refill: 6 - levocetirizine (XYZAL ) 5 MG  tablet; Take 1 tablet (5 mg total) by mouth every evening.  Dispense: 90 tablet; Refill: 1  5. B12 deficiency Continue B12 supplement as prescribed.  - cyanocobalamin  (VITAMIN B12) 1000 MCG tablet; Take 1 tablet (1,000 mcg total) by mouth daily.  Dispense: 90 tablet; Refill: 2  6. Vitamin D  deficiency Continue weekly vitamin D  supplement as prescribed  - Vitamin D , Ergocalciferol , (DRISDOL ) 1.25 MG (50000 UNIT) CAPS capsule; Take 1 capsule (50,000 Units total) by mouth every 7 (seven) days.  Dispense: 12 capsule; Refill: 0  7. Needs flu shot Flu vaccine administered in office today  - Influenza, MDCK, trivalent, PF(Flucelvax egg-free)  8. Acquired absence of both cervix and uterus Pap smears discontinued      General Counseling: Teresa Crawford verbalizes understanding of the findings of todays visit and agrees with plan of treatment. I have discussed any further diagnostic evaluation that may be needed or ordered today. We also reviewed her medications today. she has been encouraged to call the office with any questions or concerns that should arise related to todays visit.    Orders Placed This Encounter  Procedures   Influenza, MDCK, trivalent, PF(Flucelvax egg-free)    Meds ordered this encounter  Medications   acetaminophen  (TYLENOL ) 500 MG tablet    Sig: Take 1 tablet (500 mg total) by mouth every 6 (six) hours as needed for mild pain (pain score 1-3) or moderate pain (pain score 4-6).    Dispense:  30 tablet    Refill:  5    For future refills, keep on file.   albuterol  (VENTOLIN  HFA) 108 (90 Base) MCG/ACT inhaler    Sig: Inhale 1-2 puffs into the lungs every 6 (six) hours as needed for wheezing  or shortness of breath.    Dispense:  8 g    Refill:  5   COMBIVENT  RESPIMAT 20-100 MCG/ACT AERS respimat    Sig: Inhale 1 puff into the lungs every 6 (six) hours as needed for wheezing or shortness of breath.    Dispense:  4 g    Refill:  5    For future refill, keep on file    cyanocobalamin  (VITAMIN B12) 1000 MCG tablet    Sig: Take 1 tablet (1,000 mcg total) by mouth daily.    Dispense:  90 tablet    Refill:  2    FOR FUTURE USE PLEASE   EPINEPHrine  0.3 mg/0.3 mL IJ SOAJ injection    Sig: USE FOR ANAPHYLAXIS    Dispense:  1 each    Refill:  5    WILL NEED REFILLS AUTHORIZED IF PATIENT IS TO CONTINUE TOHAVE THIS IN AN EMERGENCY.  THANKS   fluticasone  (FLONASE ) 50 MCG/ACT nasal spray    Sig: Place 2 sprays into both nostrils daily.    Dispense:  16 g    Refill:  6   levocetirizine (XYZAL ) 5 MG tablet    Sig: Take 1 tablet (5 mg total) by mouth every evening.    Dispense:  90 tablet    Refill:  1   Vitamin D , Ergocalciferol , (DRISDOL ) 1.25 MG (50000 UNIT) CAPS capsule    Sig: Take 1 capsule (50,000 Units total) by mouth every 7 (seven) days.    Dispense:  12 capsule    Refill:  0    FOR FUTURE USE PLEASE    Return in about 1 month (around 02/13/2024) for F/U, Labs, Teven Mittman PCP.   Total time spent:30 Minutes Time spent includes review of chart, medications, test results, and follow up plan with the patient.   [place] Controlled Substance Database was reviewed by me.  This patient was seen by Mardy Maxin, FNP-C in collaboration with Dr. Sigrid Bathe as a part of collaborative care agreement.  Linard Daft R. Maxin, MSN, FNP-C Internal medicine

## 2024-01-20 DIAGNOSIS — K3184 Gastroparesis: Secondary | ICD-10-CM | POA: Diagnosis not present

## 2024-01-20 DIAGNOSIS — K5909 Other constipation: Secondary | ICD-10-CM | POA: Diagnosis not present

## 2024-01-21 ENCOUNTER — Other Ambulatory Visit: Payer: Self-pay | Admitting: Nurse Practitioner

## 2024-01-21 DIAGNOSIS — E559 Vitamin D deficiency, unspecified: Secondary | ICD-10-CM | POA: Diagnosis not present

## 2024-01-21 DIAGNOSIS — E782 Mixed hyperlipidemia: Secondary | ICD-10-CM | POA: Diagnosis not present

## 2024-01-21 DIAGNOSIS — E538 Deficiency of other specified B group vitamins: Secondary | ICD-10-CM | POA: Diagnosis not present

## 2024-01-22 LAB — COMPREHENSIVE METABOLIC PANEL WITH GFR
ALT: 11 IU/L (ref 0–32)
AST: 11 IU/L (ref 0–40)
Albumin: 4 g/dL (ref 3.9–4.9)
Alkaline Phosphatase: 109 IU/L (ref 41–116)
BUN/Creatinine Ratio: 9 (ref 9–23)
BUN: 6 mg/dL (ref 6–20)
Bilirubin Total: 0.4 mg/dL (ref 0.0–1.2)
CO2: 23 mmol/L (ref 20–29)
Calcium: 8.8 mg/dL (ref 8.7–10.2)
Chloride: 105 mmol/L (ref 96–106)
Creatinine, Ser: 0.68 mg/dL (ref 0.57–1.00)
Globulin, Total: 2.8 g/dL (ref 1.5–4.5)
Glucose: 93 mg/dL (ref 70–99)
Potassium: 4.2 mmol/L (ref 3.5–5.2)
Sodium: 140 mmol/L (ref 134–144)
Total Protein: 6.8 g/dL (ref 6.0–8.5)
eGFR: 114 mL/min/1.73 (ref >59)

## 2024-01-22 LAB — LIPID PANEL
Chol/HDL Ratio: 4.6 ratio — ABNORMAL HIGH (ref 0.0–4.4)
Cholesterol, Total: 158 mg/dL (ref 100–199)
HDL: 34 mg/dL — ABNORMAL LOW (ref >39)
LDL Chol Calc (NIH): 105 mg/dL — ABNORMAL HIGH (ref 0–99)
Triglycerides: 100 mg/dL (ref 0–149)
VLDL Cholesterol Cal: 19 mg/dL (ref 5–40)

## 2024-01-22 LAB — VITAMIN D 25 HYDROXY (VIT D DEFICIENCY, FRACTURES): Vit D, 25-Hydroxy: 16.9 ng/mL — ABNORMAL LOW (ref 30.0–100.0)

## 2024-01-22 LAB — CBC WITH DIFFERENTIAL/PLATELET
Basophils Absolute: 0 x10E3/uL (ref 0.0–0.2)
Basos: 1 %
EOS (ABSOLUTE): 0 x10E3/uL (ref 0.0–0.4)
Eos: 1 %
Hematocrit: 40.2 % (ref 34.0–46.6)
Hemoglobin: 13.1 g/dL (ref 11.1–15.9)
Immature Grans (Abs): 0 x10E3/uL (ref 0.0–0.1)
Immature Granulocytes: 0 %
Lymphocytes Absolute: 1.2 x10E3/uL (ref 0.7–3.1)
Lymphs: 28 %
MCH: 28.2 pg (ref 26.6–33.0)
MCHC: 32.6 g/dL (ref 31.5–35.7)
MCV: 87 fL (ref 79–97)
Monocytes Absolute: 0.3 x10E3/uL (ref 0.1–0.9)
Monocytes: 6 %
Neutrophils Absolute: 2.8 x10E3/uL (ref 1.4–7.0)
Neutrophils: 64 %
Platelets: 187 x10E3/uL (ref 150–450)
RBC: 4.64 x10E6/uL (ref 3.77–5.28)
RDW: 13.8 % (ref 11.7–15.4)
WBC: 4.4 x10E3/uL (ref 3.4–10.8)

## 2024-01-22 LAB — B12 AND FOLATE PANEL
Folate: 7 ng/mL (ref >3.0)
Vitamin B-12: 379 pg/mL (ref 232–1245)

## 2024-01-25 ENCOUNTER — Encounter: Admitting: Obstetrics

## 2024-01-25 DIAGNOSIS — Z124 Encounter for screening for malignant neoplasm of cervix: Secondary | ICD-10-CM

## 2024-01-25 MED ORDER — NORGESTIMATE-ETH ESTRADIOL 0.25-35 MG-MCG PO TABS: 1.0000 | ORAL_TABLET | Freq: Every day | ORAL | 0 refills | Status: AC

## 2024-01-28 ENCOUNTER — Ambulatory Visit: Admitting: Licensed Clinical Social Worker

## 2024-02-03 ENCOUNTER — Other Ambulatory Visit: Payer: Self-pay

## 2024-02-03 ENCOUNTER — Encounter: Payer: Self-pay | Admitting: Psychiatry

## 2024-02-03 ENCOUNTER — Ambulatory Visit (INDEPENDENT_AMBULATORY_CARE_PROVIDER_SITE_OTHER): Admitting: Psychiatry

## 2024-02-03 VITALS — BP 101/70 | HR 87 | Temp 97.2°F | Ht 63.0 in | Wt 187.6 lb

## 2024-02-03 DIAGNOSIS — F3342 Major depressive disorder, recurrent, in full remission: Secondary | ICD-10-CM

## 2024-02-03 DIAGNOSIS — Z23 Encounter for immunization: Secondary | ICD-10-CM | POA: Diagnosis not present

## 2024-02-03 DIAGNOSIS — F79 Unspecified intellectual disabilities: Secondary | ICD-10-CM | POA: Diagnosis not present

## 2024-02-03 DIAGNOSIS — F4310 Post-traumatic stress disorder, unspecified: Secondary | ICD-10-CM

## 2024-02-03 NOTE — Progress Notes (Signed)
 [facility] MD OP Progress Note  02/03/2024 2:49 PM [name]  MRN:  [id number]  Chief Complaint:  Chief Complaint  Patient presents with   Follow-up   Anxiety   Depression   Medication Refill   Discussed the use of AI scribe software for clinical note transcription with the patient, who gave verbal consent to proceed.  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  She reports that she is doing good and is glad to be doing much better. When she experiences sadness, she utilizes coping strategies learned from her previous therapist.  Currently, she describes her sleep as good and reports eating well. Staff assist her with medication management, finances, and transportation to appointments, while she independently manages most cleaning tasks and receives help with cooking as needed.  She reports currently taking Wellbutrin  150 mg with staff providing assistance in medication management.  Staff / life coach visit daily to assist with cooking, money management, medications, and transportation to appointments and stores.  She describes her neighbors as best friends. Regular walking on the [employer] track and at the mall, often with staff and neighbors, forms part of her routine. 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 She maintains regular contact with her sister by phone and has supportive relationships with her neighbors, whom she describes as best friends.   Visit Diagnosis:    ICD-10-CM   1. Recurrent major depressive disorder, in full remission  F33.42     2. Posttraumatic stress  disorder  F43.10     3. Intellectual disability  F79    Likely mild      Past Psychiatric History: I have reviewed past psychiatric history from progress note on 08/16/2018.  Past trials of fluoxetine .  Past Medical History:  Past Medical History:  Diagnosis Date   B12 deficiency anemia    Chronic back pain    Chronic fatigue syndrome    Constipation    Depression    Elevated liver enzymes    Endometriosis    Fibroids    Hepatic steatosis    Hypothyroid    Last menstrual period (LMP) > 10 days ago 09/12/15   Mental retardation    Migraine    Post traumatic stress disorder    Posttraumatic stress disorder    PUD (peptic ulcer disease)    Scoliosis    Splenomegaly    Uterine fibroid     Past Surgical History:  Procedure Laterality Date   ABDOMINAL HYSTERECTOMY  06/03/2016   BACK SURGERY     BIOPSY  04/29/2023   Procedure: BIOPSY;  Surgeon: Unk Corinn Skiff, MD;  Location: [facility] ENDOSCOPY;  Service: Gastroenterology;;   COLONOSCOPY  04/27/2006   Wohl-colitis, internal hemorrhoids   COLONOSCOPY  02/2013   focal right colitis, left colon biopsy negative, focal active proctitis without chronicity   COLONOSCOPY WITH PROPOFOL  N/A 07/16/2022   Procedure: COLONOSCOPY WITH PROPOFOL ;  Surgeon: Unk Corinn Skiff, MD;  Location: [facility] ENDOSCOPY;  Service: Gastroenterology;  Laterality: N/A;   ESOPHAGOGASTRODUODENOSCOPY  02/2013   multiple peptic & duodenal ulcers, negative h pylori   ESOPHAGOGASTRODUODENOSCOPY (EGD) WITH PROPOFOL  N/A 04/29/2023   Procedure: ESOPHAGOGASTRODUODENOSCOPY (  EGD) WITH PROPOFOL ;  Surgeon: Unk Corinn Skiff, MD;  Location: [facility] ENDOSCOPY;  Service: Gastroenterology;  Laterality: N/A;   INTRAUTERINE DEVICE INSERTION  2012   LAPAROSCOPIC ENDOMETRIOSIS FULGURATION  2015   [name]   OVARIAN CYST REMOVAL      Family Psychiatric History: I have reviewed family psychiatric history from progress note on 08/16/2018.  Family History:  Family History  Problem  Relation Age of Onset   Breast cancer Mother 91   COPD Father    Lung cancer Father 59   Dementia Father    Diabetes Sister    Diabetes Brother    Lymphoma Maternal Grandmother 81   Colon cancer Maternal Grandfather 23   Stomach cancer Paternal Grandmother    Lung cancer Paternal Grandfather    Liver disease Maternal Aunt        ?etiology   Ovarian cancer Paternal Aunt        54s   Lung cancer Other 45       maternal great grandfather   Stomach cancer Other 4       maternal great grandmother    Social History: I have reviewed social history from progress note on 08/16/2018. Social History   Socioeconomic History   Marital status: Single    Spouse name: Not on file   Number of children: 0   Years of education: Not on file   Highest education level: 10th grade  Occupational History   Not on file  Tobacco Use   Smoking status: Never   Smokeless tobacco: Never  Vaping Use   Vaping status: Never Used  Substance and Sexual Activity   Alcohol use: Not Currently    Alcohol/week: 0.0 standard drinks of alcohol   Drug use: No   Sexual activity: Not Currently  Other Topics Concern   Not on file  Social History Narrative   She has brother, half brother & sister live nearby   Both parents deceased   Care managed by Elgin Hamilton   Social Drivers of Health   Financial Resource Strain: Low Risk  (10/14/2023)   [facility]   Overall Financial Resource Strain (CARDIA)    Difficulty of Paying Living Expenses: Not very hard  Food Insecurity: No Food Insecurity (10/14/2023)   [facility] from [facility]   Hunger Vital Sign    Within the past 12 months, you worried that your food would run out before you got the money to buy more.: Never true    Within the past 12 months, the food you bought just didn't last and you didn't have money to get more.: Never true  Transportation Needs: No Transportation Needs (10/14/2023)   Received from  [facility] - Transportation    In the past 12 months, has lack of transportation kept you from medical appointments or from getting medications?: No    Lack of Transportation (Non-Medical): No  Physical Activity: Inactive (02/11/2017)   Exercise Vital Sign    Days of Exercise per Week: 0 days    Minutes of Exercise per Session: 0 min  Stress: No Stress Concern Present (02/11/2017)   [employer] - Occupational Stress Questionnaire    Feeling of Stress : Not at all  Social Connections: Unknown (02/11/2017)   Social Connection and Isolation Panel    Frequency of Communication with Friends and Family: Patient declined    Frequency of Social Gatherings with Friends and Family: Patient declined  Attends Religious [place]: Patient declined    Active Member of Clubs or Organizations: Patient declined    Attends Banker Meetings: Patient declined    Marital Status: Patient declined    Allergies:  Allergies  Allergen Reactions   Other Anaphylaxis    Peanut butter, uses Epi pen   Peanut Butter Flavoring Agent (Non-Screening) Swelling    Throat swells Throat swells   Sertraline  Other (See Comments)    Panic attacks   Codeine  Nausea And Vomiting    nausea   Paroxetine Other (See Comments)    Panic Attack    Paroxetine Hcl Other (See Comments)    Panic attack Other reaction(s): Other (See Comments) Panic attacks   Prednisone  & Diphenhydramine  Rash   Venlafaxine Other (See Comments)    Panic Attack    Zoloft  [Sertraline  Hcl] Other (See Comments)    Panic attack    Metabolic Disorder Labs: Lab Results  Component Value Date   HGBA1C 5.5 12/20/2019   No results found for: PROLACTIN Lab Results  Component Value Date   CHOL 158 01/21/2024   TRIG 100 01/21/2024   HDL 34 (L) 01/21/2024   CHOLHDL 4.6 (H) 01/21/2024   LDLCALC 105 (H) 01/21/2024   LDLCALC 103 (H) 01/15/2022   Lab Results  Component Value  Date   TSH 2.780 05/07/2023   TSH 3.060 12/28/2022    Therapeutic Level Labs: No results found for: LITHIUM No results found for: VALPROATE No results found for: CBMZ  Current Medications: Current Outpatient Medications  Medication Sig Dispense Refill   acetaminophen  (TYLENOL ) 500 MG tablet Take 1 tablet (500 mg total) by mouth every 6 (six) hours as needed for mild pain (pain score 1-3) or moderate pain (pain score 4-6). 30 tablet 5   albuterol  (VENTOLIN  HFA) 108 (90 Base) MCG/ACT inhaler Inhale 1-2 puffs into the lungs every 6 (six) hours as needed for wheezing or shortness of breath. 8 g 5   benzonatate  (TESSALON ) 200 MG capsule Take 200 mg by mouth 2 (two) times daily as needed for cough.     buPROPion  (WELLBUTRIN  XL) 150 MG 24 hr tablet TAKE 1 TABLET BY MOUTH IN THE MORNING 30 tablet 5   COMBIVENT  RESPIMAT 20-100 MCG/ACT AERS respimat Inhale 1 puff into the lungs every 6 (six) hours as needed for wheezing or shortness of breath. 4 g 5   cyanocobalamin  (VITAMIN B12) 1000 MCG tablet Take 1 tablet (1,000 mcg total) by mouth daily. 90 tablet 2   diphenoxylate -atropine  (LOMOTIL ) 2.5-0.025 MG tablet Take 1 tablet by mouth 4 (four) times daily as needed for diarrhea or loose stools. 30 tablet 3   docusate sodium  (COLACE) 100 MG capsule TAKE 1 CAPSULE BY MOUTH ONCE DAILY FOR STOOL SOFTENER 90 capsule 1   EPINEPHrine  0.3 mg/0.3 mL IJ SOAJ injection USE FOR ANAPHYLAXIS 1 each 5   fluticasone  (FLONASE ) 50 MCG/ACT nasal spray Place 2 sprays into both nostrils daily. 16 g 6   levocetirizine (XYZAL ) 5 MG tablet Take 1 tablet (5 mg total) by mouth every evening. 90 tablet 1   linaclotide  (LINZESS ) 290 MCG CAPS capsule Take 1 capsule (290 mcg total) by mouth daily before breakfast. 90 capsule 3   norgestimate -ethinyl estradiol  (SPRINTEC 28) 0.25-35 MG-MCG tablet Take 1 tablet by mouth daily. 84 tablet 0   nortriptyline (PAMELOR) 10 MG capsule TAKE 10 MG (1 CAPSULE) BY MOUTH FOR ONE WEEK, THEN  INCREASE TO 20 MG (2 CAPSULES) NIGHTLY FOR HEADACHES     ondansetron  (ZOFRAN )  8 MG tablet Take 8 mg by mouth every 8 (eight) hours as needed for refractory nausea / vomiting.     polyethylene glycol powder (GLYCOLAX /MIRALAX ) 17 GM/SCOOP powder Take 17 g by mouth daily. 238 g 3   topiramate  (TOPAMAX ) 50 MG tablet TAKE 1 TABLET BY MOUTH EVERY EVENING 30 tablet 5   traMADol  (ULTRAM ) 50 MG tablet ONE TAB PO BID PRN FOR PAIN AND HEADCAHES 60 tablet 2   triamcinolone  cream (KENALOG ) 0.1 % Apply 1 Application topically 2 (two) times daily as needed (itching and rash). 453.6 g 1   Vitamin D , Ergocalciferol , (DRISDOL ) 1.25 MG (50000 UNIT) CAPS capsule Take 1 capsule (50,000 Units total) by mouth every 7 (seven) days. 12 capsule 0   diphenhydrAMINE  (BENADRYL ) 25 mg capsule Take 25 mg by mouth every 6 (six) hours as needed for allergies. (Patient not taking: Reported on 02/03/2024)     No current facility-administered medications for this visit.     Musculoskeletal: Strength & Muscle Tone: within normal limits Gait & Station: normal Patient leans: N/A  Psychiatric Specialty Exam: Review of Systems  Psychiatric/Behavioral: Negative.      Blood pressure 101/70, pulse 87, temperature (!) 97.2 F (36.2 C), temperature source Temporal, height 5' 3 (1.6 m), weight 187 lb 9.6 oz (85.1 kg), last menstrual period 12/06/2015.Body mass index is 33.23 kg/m.  General Appearance: Fairly Groomed  Eye Contact:  Fair  Speech:  Normal Rate  Volume:  Normal  Mood:  Euthymic  Affect:  Congruent  Thought Process:  Goal Directed and Descriptions of Associations: Intact  Orientation:  Full (Time, Place, and Person)  Thought Content: Logical   Suicidal Thoughts:  No  Homicidal Thoughts:  No  Memory:  Immediate;   Fair Recent;   Fair Remote;   limited  Judgement:  Fair  Insight:  Fair  Psychomotor Activity:  Normal  Concentration:  Concentration: Fair and Attention Span: limited  Recall:  [employer] of  Knowledge: Fair  Language: Fair  Akathisia:  No  Handed:  Left  AIMS (if indicated): not done  Assets:  Communication Skills Desire for Improvement Housing Social Support  ADL's:  Intact  Cognition: Baseline  Sleep:  Fair   Screenings: AIMS    Flowsheet Row Video Visit from 01/20/2022 in [facility] Video Visit from 10/22/2021 in [facility]  AIMS Total Score 0 0   GAD-7    Flowsheet Row Office Visit from 02/03/2024 in [facility] Counselor from 06/11/2023 in [facility] Office Visit from 02/24/2023 in [facility] Office Visit from 04/21/2022 in [facility] Video Visit from 01/20/2022 in [facility]  Total GAD-7 Score 0 10 0 0 1   Mini-Mental    Flowsheet Row Office Visit from 01/13/2024 in [facility] Office Visit from 01/07/2023 in [facility] Office Visit from 01/01/2022 in [facility] Clinical Support from 12/25/2020 in [facility] Clinical Support from 12/19/2019 in [facility]  Total Score (max 30 points ) 29 29 29 29 30    PHQ2-9    Flowsheet Row Office Visit from 02/03/2024 in [facility] Counselor from 06/11/2023 in [facility] Office Visit from 03/09/2023 in [facility], [facility] Office Visit from 02/24/2023 in [facility]  Office Visit from 01/07/2023 in [facility]  PHQ-2 Total Score 0 4 0 0 3  PHQ-9 Total Score -- 14 -- 5 7   Flowsheet Row Office Visit from 02/03/2024 in [facility] Video Visit from 11/04/2023 in [facility] Video Visit from 08/02/2023 in [facility]  C-SSRS RISK CATEGORY No Risk No Risk No Risk     Assessment and Plan: SHERISE GEERDES is a 38 year old Caucasian female who has a history of MDD, PTSD, insomnia, currently presented for a follow-up appointment, discussed assessment and plan as noted below.  1. Recurrent major depressive disorder, in full remission Reports overall mood symptoms are stable. Continue Wellbutrin  XL 150 mg daily  2. Posttraumatic stress disorder-stable Currently denies any concerns Will reevaluate in future sessions  3. Intellectual disability-chronic-mild Has good support system, community living coach.  Collateral information obtained from community coach, Harlene who was present in session today.  As per staff patient is currently doing well.  No new concerns.   Follow-up Follow-up in clinic in 3 months or sooner if needed.    Consent: Patient/Guardian gives verbal consent for treatment and assignment of benefits for [place] provided during this visit. Patient/Guardian expressed understanding and agreed to proceed.   This note was generated in part or whole with voice recognition software. Voice recognition is usually quite accurate but there are transcription errors that can and very often do occur. I apologize for any typographical errors that were not detected and corrected.    Anupama Piehl, MD 02/03/2024, 2:49 PM

## 2024-02-08 ENCOUNTER — Encounter: Payer: Self-pay | Admitting: Licensed Clinical Social Worker

## 2024-02-11 NOTE — Progress Notes (Unsigned)
 This encounter was created in error - please disregard.

## 2024-02-16 ENCOUNTER — Ambulatory Visit: Admitting: Nurse Practitioner

## 2024-02-16 ENCOUNTER — Encounter: Payer: Self-pay | Admitting: Nurse Practitioner

## 2024-02-16 VITALS — BP 124/86 | HR 73 | Temp 95.6°F | Resp 16 | Ht 63.0 in | Wt 191.4 lb

## 2024-02-16 DIAGNOSIS — E559 Vitamin D deficiency, unspecified: Secondary | ICD-10-CM | POA: Diagnosis not present

## 2024-02-16 DIAGNOSIS — G43809 Other migraine, not intractable, without status migrainosus: Secondary | ICD-10-CM

## 2024-02-16 DIAGNOSIS — E782 Mixed hyperlipidemia: Secondary | ICD-10-CM

## 2024-02-16 DIAGNOSIS — E538 Deficiency of other specified B group vitamins: Secondary | ICD-10-CM | POA: Diagnosis not present

## 2024-02-16 DIAGNOSIS — Z803 Family history of malignant neoplasm of breast: Secondary | ICD-10-CM

## 2024-02-16 DIAGNOSIS — Z1231 Encounter for screening mammogram for malignant neoplasm of breast: Secondary | ICD-10-CM

## 2024-02-16 DIAGNOSIS — K219 Gastro-esophageal reflux disease without esophagitis: Secondary | ICD-10-CM | POA: Diagnosis not present

## 2024-02-16 MED ORDER — VITAMIN D (ERGOCALCIFEROL) 1.25 MG (50000 UNIT) PO CAPS
50000.0000 [IU] | ORAL_CAPSULE | ORAL | 3 refills | Status: AC
Start: 2024-02-16 — End: ?

## 2024-02-16 NOTE — Progress Notes (Signed)
 [facility] [address] [place], [place] [address]  Internal MEDICINE  Office Visit Note  Patient Name: Teresa Crawford  [id number]  [id number]  Date of Service: 02/16/2024  Chief Complaint  Patient presents with   Depression   Follow-up    Review labs    HPI Teresa Crawford presents for a follow-up visit for lab results, migraines, GERD and breast cancer screening.  Low vitamin D  at 16.9 Elevated LDL 105, low HDL 35. Migraines -- medication from neurology is not working.  GERD -- improved, not currently on any medication for acid reflux Family history of breast cancer in mother when she was in her 30s. Her sister has breast cancer as a secondary site, cervical cancer is the primary cancer currently    Current Medication: Outpatient Encounter Medications as of 02/16/2024  Medication Sig   acetaminophen  (TYLENOL ) 500 MG tablet Take 1 tablet (500 mg total) by mouth every 6 (six) hours as needed for mild pain (pain score 1-3) or moderate pain (pain score 4-6).   albuterol  (VENTOLIN  HFA) 108 (90 Base) MCG/ACT inhaler Inhale 1-2 puffs into the lungs every 6 (six) hours as needed for wheezing or shortness of breath.   benzonatate  (TESSALON ) 200 MG capsule Take 200 mg by mouth 2 (two) times daily as needed for cough.   buPROPion  (WELLBUTRIN  XL) 150 MG 24 hr tablet TAKE 1 TABLET BY MOUTH IN THE MORNING   COMBIVENT  RESPIMAT 20-100 MCG/ACT AERS respimat Inhale 1 puff into the lungs every 6 (six) hours as needed for wheezing or shortness of breath.   cyanocobalamin  (VITAMIN B12) 1000 MCG tablet Take 1 tablet (1,000 mcg total) by mouth daily.   diphenhydrAMINE  (BENADRYL ) 25 mg capsule Take 25 mg by mouth every 6 (six) hours as needed for allergies. (Patient not taking: Reported on 02/03/2024)   diphenoxylate -atropine  (LOMOTIL ) 2.5-0.025 MG tablet Take 1 tablet by mouth 4 (four) times daily as needed for diarrhea or loose stools.   docusate sodium  (COLACE) 100 MG capsule TAKE 1 CAPSULE  BY MOUTH ONCE DAILY FOR STOOL SOFTENER   EPINEPHrine  0.3 mg/0.3 mL IJ SOAJ injection USE FOR ANAPHYLAXIS   fluticasone  (FLONASE ) 50 MCG/ACT nasal spray Place 2 sprays into both nostrils daily.   levocetirizine (XYZAL ) 5 MG tablet Take 1 tablet (5 mg total) by mouth every evening.   linaclotide  (LINZESS ) 290 MCG CAPS capsule Take 1 capsule (290 mcg total) by mouth daily before breakfast.   norgestimate -ethinyl estradiol  (SPRINTEC 28) 0.25-35 MG-MCG tablet Take 1 tablet by mouth daily.   nortriptyline (PAMELOR) 10 MG capsule TAKE 10 MG (1 CAPSULE) BY MOUTH FOR ONE WEEK, THEN INCREASE TO 20 MG (2 CAPSULES) NIGHTLY FOR HEADACHES   ondansetron  (ZOFRAN ) 8 MG tablet Take 8 mg by mouth every 8 (eight) hours as needed for refractory nausea / vomiting.   polyethylene glycol powder (GLYCOLAX /MIRALAX ) 17 GM/SCOOP powder Take 17 g by mouth daily.   topiramate  (TOPAMAX ) 50 MG tablet TAKE 1 TABLET BY MOUTH EVERY EVENING   traMADol  (ULTRAM ) 50 MG tablet ONE TAB PO BID PRN FOR PAIN AND HEADCAHES   triamcinolone  cream (KENALOG ) 0.1 % Apply 1 Application topically 2 (two) times daily as needed (itching and rash).   Vitamin D , Ergocalciferol , (DRISDOL ) 1.25 MG (50000 UNIT) CAPS capsule Take 1 capsule (50,000 Units total) by mouth every 7 (seven) days.   [DISCONTINUED] Vitamin D , Ergocalciferol , (DRISDOL ) 1.25 MG (50000 UNIT) CAPS capsule Take 1 capsule (50,000 Units total) by mouth every 7 (seven) days.   No facility-administered  encounter medications on file as of 02/16/2024.    Surgical History: Past Surgical History:  Procedure Laterality Date   ABDOMINAL HYSTERECTOMY  06/03/2016   BACK SURGERY     BIOPSY  04/29/2023   Procedure: BIOPSY;  Surgeon: Unk Corinn Skiff, MD;  Location: [facility] ENDOSCOPY;  Service: Gastroenterology;;   COLONOSCOPY  04/27/2006   Wohl-colitis, internal hemorrhoids   COLONOSCOPY  02/2013   focal right colitis, left colon biopsy negative, focal active proctitis without chronicity    COLONOSCOPY WITH PROPOFOL  N/A 07/16/2022   Procedure: COLONOSCOPY WITH PROPOFOL ;  Surgeon: Unk Corinn Skiff, MD;  Location: [facility] ENDOSCOPY;  Service: Gastroenterology;  Laterality: N/A;   ESOPHAGOGASTRODUODENOSCOPY  02/2013   multiple peptic & duodenal ulcers, negative h pylori   ESOPHAGOGASTRODUODENOSCOPY (EGD) WITH PROPOFOL  N/A 04/29/2023   Procedure: ESOPHAGOGASTRODUODENOSCOPY (EGD) WITH PROPOFOL ;  Surgeon: Unk Corinn Skiff, MD;  Location: [facility] ENDOSCOPY;  Service: Gastroenterology;  Laterality: N/A;   INTRAUTERINE DEVICE INSERTION  2012   LAPAROSCOPIC ENDOMETRIOSIS FULGURATION  2015   [name]   OVARIAN CYST REMOVAL      Medical History: Past Medical History:  Diagnosis Date   B12 deficiency anemia    Chronic back pain    Chronic fatigue syndrome    Constipation    Depression    Elevated liver enzymes    Endometriosis    Fibroids    Hepatic steatosis    Hypothyroid    Last menstrual period (LMP) > 10 days ago 09/12/15   Mental retardation    Migraine    Post traumatic stress disorder    Posttraumatic stress disorder    PUD (peptic ulcer disease)    Scoliosis    Splenomegaly    Uterine fibroid     Family History: Family History  Problem Relation Age of Onset   Breast cancer Mother 23   COPD Father    Lung cancer Father 2   Dementia Father    Diabetes Sister    Diabetes Brother    Lymphoma Maternal Grandmother 75   Colon cancer Maternal Grandfather 20   Stomach cancer Paternal Grandmother    Lung cancer Paternal Grandfather    Liver disease Maternal Aunt        ?etiology   Ovarian cancer Paternal Aunt        7s   Lung cancer Other 39       maternal great grandfather   Stomach cancer Other 2       maternal great grandmother    Social History   Socioeconomic History   Marital status: Single    Spouse name: Not on file   Number of children: 0   Years of education: Not on file   Highest education level: 10th grade  Occupational History   Not on  file  Tobacco Use   Smoking status: Never   Smokeless tobacco: Never  Vaping Use   Vaping status: Never Used  Substance and Sexual Activity   Alcohol use: Not Currently    Alcohol/week: 0.0 standard drinks of alcohol   Drug use: No   Sexual activity: Not Currently  Other Topics Concern   Not on file  Social History Narrative   She has brother, half brother & sister live nearby   Both parents deceased   Care managed by Elgin [facility]   Social Drivers of Health   Financial Resource Strain: Low Risk  (10/14/2023)   Received from [facility]   Overall Financial Resource Strain (CARDIA)    Difficulty  of Paying Living Expenses: Not very hard  Food Insecurity: No Food Insecurity (10/14/2023)   Received from [facility]   Hunger Vital Sign    Within the past 12 months, you worried that your food would run out before you got the money to buy more.: Never true    Within the past 12 months, the food you bought just didn't last and you didn't have money to get more.: Never true  Transportation Needs: No Transportation Needs (10/14/2023)   Received from [facility] - Transportation    In the past 12 months, has lack of transportation kept you from medical appointments or from getting medications?: No    Lack of Transportation (Non-Medical): No  Physical Activity: Inactive (02/11/2017)   Exercise Vital Sign    Days of Exercise per Week: 0 days    Minutes of Exercise per Session: 0 min  Stress: No Stress Concern Present (02/11/2017)   [employer] - Occupational Stress Questionnaire    Feeling of Stress : Not at all  Social Connections: Unknown (02/11/2017)   Social Connection and Isolation Panel    Frequency of Communication with Friends and Family: Patient declined    Frequency of Social Gatherings with Friends and Family: Patient declined    Attends Religious Services: Patient declined    Automotive Engineer or Organizations: Patient declined    Attends Banker Meetings: Patient declined    Marital Status: Patient declined  Intimate Partner Violence: Unknown (02/11/2017)   Humiliation, Afraid, Rape, and Kick questionnaire    Fear of Current or Ex-Partner: Patient declined    Emotionally Abused: Patient declined    Physically Abused: Patient declined    Sexually Abused: Patient declined      Review of Systems  Constitutional:  Negative for fatigue and fever.  HENT:  Negative for congestion, mouth sores and postnasal drip.   Respiratory:  Negative for cough, chest tightness, shortness of breath and wheezing.   Cardiovascular:  Negative for chest pain and palpitations.  Gastrointestinal:  Positive for abdominal distention, abdominal pain, constipation, nausea and vomiting. Negative for diarrhea.       Bloating, early satiety  Genitourinary:  Negative for flank pain.  Psychiatric/Behavioral: Negative.      Vital Signs: BP 124/86   Pulse 73   Temp (!) 95.6 F (35.3 C)   Resp 16   Ht 5' 3 (1.6 m)   Wt 191 lb 6.4 oz (86.8 kg)   LMP 12/06/2015   SpO2 99%   BMI 33.90 kg/m    Physical Exam Vitals reviewed.  Constitutional:      General: She is not in acute distress.    Appearance: Normal appearance. She is obese. She is not ill-appearing or diaphoretic.  HENT:     Head: Normocephalic and atraumatic.  Eyes:     Pupils: Pupils are equal, round, and reactive to light.  Cardiovascular:     Rate and Rhythm: Normal rate and regular rhythm.  Pulmonary:     Effort: Pulmonary effort is normal. No respiratory distress.  Abdominal:     General: Bowel sounds are normal. There is distension.     Palpations: Abdomen is soft. There is no mass.     Tenderness: There is abdominal tenderness. There is no guarding or rebound.     Hernia: No hernia is present.  Neurological:     Mental Status: She is alert and oriented to person, place,  and time.  Psychiatric:         Mood and Affect: Mood normal.        Behavior: Behavior normal.        Assessment/Plan: 1. Vestibular migraine Continue nortriptyline and continue to follow up with neurology.   2. Vitamin D  deficiency Continue vitamin D  supplement as prescribed  - Vitamin D , Ergocalciferol , (DRISDOL ) 1.25 MG (50000 UNIT) CAPS capsule; Take 1 capsule (50,000 Units total) by mouth every 7 (seven) days.  Dispense: 12 capsule; Refill: 3  3. B12 deficiency Continue oral B12 supplement as prescribed.   4. Mixed hyperlipidemia Not currently on any medication for this. Limit red meat in diet, increase lean protein intake and add fish oil supplement if desired   5. Gastroesophageal reflux disease without esophagitis Continue to follow up with GI  6. Family history of breast cancer in first degree relative (Primary) Mammogram ordered  - MM 3D SCREENING MAMMOGRAM BILATERAL BREAST; Future  7. Encounter for screening mammogram for malignant neoplasm of breast Mammogram ordered  - MM 3D SCREENING MAMMOGRAM BILATERAL BREAST; Future   General Counseling: Cecely verbalizes understanding of the findings of todays visit and agrees with plan of treatment. I have discussed any further diagnostic evaluation that may be needed or ordered today. We also reviewed her medications today. she has been encouraged to call the office with any questions or concerns that should arise related to todays visit.    Orders Placed This Encounter  Procedures   MM 3D SCREENING MAMMOGRAM BILATERAL BREAST    Meds ordered this encounter  Medications   Vitamin D , Ergocalciferol , (DRISDOL ) 1.25 MG (50000 UNIT) CAPS capsule    Sig: Take 1 capsule (50,000 Units total) by mouth every 7 (seven) days.    Dispense:  12 capsule    Refill:  3    FOR FUTURE USE PLEASE    Return in about 4 months (around 06/15/2024) for F/U, Ravon Mcilhenny PCP.   Total time spent:30 Minutes Time spent includes review of chart, medications, test  results, and follow up plan with the patient.   [place] Controlled Substance Database was reviewed by me.  This patient was seen by Mardy Maxin, FNP-C in collaboration with Dr. Sigrid Bathe as a part of collaborative care agreement.   Jozalyn Baglio R. Maxin, MSN, FNP-C Internal medicine

## 2024-02-17 ENCOUNTER — Other Ambulatory Visit: Payer: Self-pay | Admitting: Internal Medicine

## 2024-02-17 NOTE — Progress Notes (Unsigned)
   THERAPIST PROGRESS NOTE  Virtual Visit via Video Note  I connected with Teresa Crawford on 02/17/24 at 10:00 AM EST by a video enabled telemedicine application and verified that I am speaking with the correct person using two identifiers.  Location: Patient: Address on file Provider: Providers address   I discussed the limitations of evaluation and management by telemedicine and the availability of in person appointments. The patient expressed understanding and agreed to proceed.     I discussed the assessment and treatment plan with the patient. The patient was provided an opportunity to ask questions and all were answered. The patient agreed with the plan and demonstrated an understanding of the instructions.   The patient was advised to call back or seek an in-person evaluation if the symptoms worsen or if the condition fails to improve as anticipated.  I provided *** minutes of non-face-to-face time during this encounter.   [name], LCSW   Session Time: ***  Participation Level: Active  Behavioral Response: CasualAlertEuthymic  Type of Therapy: Individual Therapy  Treatment Goals addressed: ***  ProgressTowards Goals: Progressing  Interventions: {CHL AMB BH Type of Intervention:21022753}  Summary: Teresa Crawford is a 38 y.o. female who presents with symptoms of anxiety and bereavement. Patient identifies symptoms to include uncontrollable worry, negative self affect, low mood, and fatigue. Pt was oriented times 5. Pt was cooperative and engaged. Pt denies SI/HI/AVH.   Cln utilized the first half of session to review patients progress. See progress notes documented above. The patient reports***  The clinician readministered the PHQ-9 and GAD-7 assessments. The patient's anxiety scores ***creased from 5 to ***, and depression scores also ***creased from 11 to ***. The patient shares***   Suicidal/Homicidal: Nowithout intent/plan  Therapist Response:  Clinician utilized active and supportive reflection to create a safe environment for patient to process recent life stressors. Clinician assessed for current stressors, symptoms, and safety since last session.   Plan: Return again in 4 weeks.  Diagnosis: Recurrent major depressive disorder, in full remission  Posttraumatic stress disorder   Collaboration of Care: AEB psychiatrist can access notes and cln. Will review psychiatrists' notes. Check in with the patient and will see LCSW per availability. Patient agreed with treatment recommendations.   Patient/Guardian was advised Release of Information must be obtained prior to any record release in order to collaborate their care with an outside provider. Patient/Guardian was advised if they have not already done so to contact the registration department to sign all necessary forms in order for us  to release information regarding their care.   Consent: Patient/Guardian gives verbal consent for treatment and assignment of benefits for services provided during this visit. Patient/Guardian expressed understanding and agreed to proceed.   [name], LCSW 02/17/2024

## 2024-02-18 ENCOUNTER — Ambulatory Visit: Admitting: Licensed Clinical Social Worker

## 2024-02-18 ENCOUNTER — Ambulatory Visit (INDEPENDENT_AMBULATORY_CARE_PROVIDER_SITE_OTHER): Admitting: Licensed Clinical Social Worker

## 2024-02-18 DIAGNOSIS — F4310 Post-traumatic stress disorder, unspecified: Secondary | ICD-10-CM | POA: Diagnosis not present

## 2024-02-18 DIAGNOSIS — F3342 Major depressive disorder, recurrent, in full remission: Secondary | ICD-10-CM

## 2024-02-18 DIAGNOSIS — F79 Unspecified intellectual disabilities: Secondary | ICD-10-CM
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
## 2024-02-29 ENCOUNTER — Ambulatory Visit: Admitting: Licensed Clinical Social Worker

## 2024-03-06 ENCOUNTER — Ambulatory Visit: Admitting: Certified Nurse Midwife

## 2024-03-06 ENCOUNTER — Encounter: Payer: Self-pay | Admitting: Certified Nurse Midwife

## 2024-03-06 VITALS — BP 110/77 | HR 81 | Resp 16 | Ht 63.0 in | Wt 189.2 lb

## 2024-03-06 DIAGNOSIS — Z0000 Encounter for general adult medical examination without abnormal findings: Secondary | ICD-10-CM

## 2024-03-06 DIAGNOSIS — Z01419 Encounter for gynecological examination (general) (routine) without abnormal findings: Secondary | ICD-10-CM

## 2024-03-06 NOTE — Patient Instructions (Addendum)
 Preventive Care 28-39 Years Old, Female  Preventive care refers to lifestyle choices and visits with your health care provider that can promote health and wellness. Preventive care visits are also called wellness exams. What can I expect for my preventive care visit? Counseling During your preventive care visit, your health care provider may ask about your: Medical history, including: Past medical problems. Family medical history. Pregnancy history. Current health, including: Menstrual cycle. Method of birth control. Emotional well-being. Home life and relationship well-being. Sexual activity and sexual health. Lifestyle, including: Alcohol, nicotine or tobacco, and drug use. Access to firearms. Diet, exercise, and sleep habits. Work and work Astronomer. Sunscreen use. Safety issues such as seatbelt and bike helmet use. Physical exam Your health care provider may check your: Height and weight. These may be used to calculate your BMI (body mass index). BMI is a measurement that tells if you are at a healthy weight. Waist circumference. This measures the distance around your waistline. This measurement also tells if you are at a healthy weight and may help predict your risk of certain diseases, such as type 2 diabetes and high blood pressure. Heart rate and blood pressure. Body temperature. Skin for abnormal spots. What immunizations do I need?  Vaccines are usually given at various ages, according to a schedule. Your health care provider will recommend vaccines for you based on your age, medical history, and lifestyle or other factors, such as travel or where you work. What tests do I need? Screening Your health care provider may recommend screening tests for certain conditions. This may include: Pelvic exam and Pap test. Lipid and cholesterol levels. Diabetes screening. This is done by checking your blood sugar (glucose) after you have not eaten for a while  (fasting). Hepatitis B test. Hepatitis C test. HIV (human immunodeficiency virus) test. STI (sexually transmitted infection) testing, if you are at risk. BRCA-related cancer screening. This may be done if you have a family history of breast, ovarian, tubal, or peritoneal cancers. Talk with your health care provider about your test results, treatment options, and if necessary, the need for more tests. Follow these instructions at home: Eating and drinking  Eat a healthy diet that includes fresh fruits and vegetables, whole grains, lean protein, and low-fat dairy products. Take vitamin and mineral supplements as recommended by your health care provider. Do not drink alcohol if: Your health care provider tells you not to drink. You are pregnant, may be pregnant, or are planning to become pregnant. If you drink alcohol: Limit how much you have to 0-1 drink a day. Know how much alcohol is in your drink. In the [place] one drink equals one 12 oz bottle of beer (355 mL), one 5 oz glass of wine (148 mL), or one 1 oz glass of hard liquor (44 mL). Lifestyle Brush your teeth every morning and night with fluoride toothpaste. Floss one time each day. Exercise for at least 30 minutes 5 or more days each week. Do not use any products that contain nicotine or tobacco. These products include cigarettes, chewing tobacco, and vaping devices, such as e-cigarettes. If you need help quitting, ask your health care provider. Do not use drugs. If you are sexually active, practice safe sex. Use a condom or other form of protection to prevent STIs. If you do not wish to become pregnant, use a form of birth control. If you plan to become pregnant, see your health care provider for a prepregnancy visit. Find healthy ways to manage stress, such as:  Meditation, yoga, or listening to music. Journaling. Talking to a trusted person. Spending time with friends and family. Minimize exposure to UV radiation to reduce your  risk of skin cancer. Safety Always wear your seat belt while driving or riding in a vehicle. Do not drive: If you have been drinking alcohol. Do not ride with someone who has been drinking. If you have been using any mind-altering substances or drugs. While texting. When you are tired or distracted. Wear a helmet and other protective equipment during sports activities. If you have firearms in your house, make sure you follow all gun safety procedures. Seek help if you have been physically or sexually abused. What's next? Go to your health care provider once a year for an annual wellness visit. Ask your health care provider how often you should have your eyes and teeth checked. Stay up to date on all vaccines. This information is not intended to replace advice given to you by your health care provider. Make sure you discuss any questions you have with your health care provider. Document Revised: 09/18/2020 Document Reviewed: 09/18/2020 Elsevier Patient Education  2024 Elsevier Inc.     How to Do a Breast Self-Exam Doing breast self-exams can help you stay healthy. They're one way to know what's normal for your breasts. They can help you catch a problem while it's still small and can be treated. You need to: Check your breasts often. Tell your doctor about any changes. You should do breast self-exams even if you have breast implants. What you need: A mirror. A well-lit room. A pillow or other soft object. How to do a breast self-exam Look for changes  Take off all the clothes above your waist. Stand in front of a mirror in a room with good lighting. Put your hands down at your sides. Compare your breasts in the mirror. Look for difference between them, such as: Differences in shape. Differences in size. Wrinkles, dips, and bumps in one breast and not the other. Look at each breast for skin changes, such as: Redness. Scaly spots. Spots where your skin is  thicker. Dimpling. Open sores. Look for changes in your nipples, such as: Fluid coming out of a nipple. Fluid around a nipple. Bleeding. Dimpling. Redness. A nipple that looks pushed in or that has changed position. Feel for changes Lie on your back. Feel each breast. To do this: Pick a breast to feel. Place a pillow under the shoulder closest to that breast. Put the arm closest to that breast behind your head. Feel the breast using the hand of your other arm. Use the pads of your three middle fingers to make small circles starting near the nipple. Use light, medium, and firm pressure. Keep making circles, moving down over the breast. Stop when you feel your ribs. Start making circles with your fingers again, this time going up until you reach your collarbone. Then, make circles out across your breast and into your armpit area. Squeeze your nipple. Check for fluid and lumps. Do these steps again to check your other breast. Sit or stand in the tub or shower. With soapy water on your skin, feel each breast the same way you did when you were lying down. Write down what you find Writing down what you find can help you keep track of what you want to tell your doctor. Write down: What's normal for each breast. Any changes you find. Write down: The kind of change. If your breast feels tender or painful.  Any lump you find. Write down its size and where it is. When you last had your period. General tips If you're breastfeeding, the best time to check your breasts is after you feed your baby or after you use a breast pump. If you get a period, the best time to check your breasts is 5-7 days after your period ends. With time, you'll get more used to doing the self-exam. You'll also start to know if there are changes in your breasts. Contact a doctor if: You see a change in the shape or size of your breasts or nipples. You see a change in the skin of your breast or nipples. You have fluid  coming from your nipples that isn't normal. You find a new lump or thick area. You have breast pain. You have any concerns about your breast health. This information is not intended to replace advice given to you by your health care provider. Make sure you discuss any questions you have with your health care provider. Document Revised: 06/02/2023 Document Reviewed: 06/02/2023 Elsevier Patient Education  2025 [employer].

## 2024-03-06 NOTE — Progress Notes (Signed)
 GYNECOLOGY ANNUAL PHYSICAL EXAM PROGRESS NOTE  Subjective:    Teresa Crawford is a 38 y.o. G0P0000 female who presents for an annual exam.  The patient is not sexually active. The patient participates in regular exercise: yes. Has the patient ever been transfused or tattooed?: no. The patient reports that there is not domestic violence in her life.   Here today with caregiver from group home.   The patient has the following complaints today: none  Menstrual History:  Patient's last menstrual period was 12/06/2015.     Gynecologic History:  Contraception: status post hysterectomy History of STI's: Denies Last Pap: 2018. Results were: normal. Denies h/o abnormal pap smears. Last mammogram: Not age appropriate  [facility] - 03/06/24 1346       Pregnancy Intention Screening   Does the patient want to become pregnant in the next year? N/A    Does the patient's partner want to become pregnant in the next year? N/A    Would the patient like to discuss contraceptive options today? N/A      Contraception Wrap Up   Current Method Female Sterilization    End Method Female Sterilization    Contraception Counseling Provided No    How was the end contraceptive method provided? N/A            OB History  Gravida Para Term Preterm AB Living  0 0 0 0 0 0  SAB IAB Ectopic Multiple Live Births  0 0 0 0 0    Past Medical History:  Diagnosis Date   B12 deficiency anemia    Chronic back pain    Chronic fatigue syndrome    Constipation    Depression    Elevated liver enzymes    Endometriosis    Fibroids    Hepatic steatosis    Hypothyroid    Last menstrual period (LMP) > 10 days ago 09/12/15   Mental retardation    Migraine    Post traumatic stress disorder    Posttraumatic stress disorder    PUD (peptic ulcer disease)    Scoliosis    Splenomegaly    Uterine fibroid     Past Surgical History:  Procedure Laterality Date   ABDOMINAL HYSTERECTOMY  06/03/2016   BACK  SURGERY     BIOPSY  04/29/2023   Procedure: BIOPSY;  Surgeon: Unk Corinn Skiff, MD;  Location: [facility] ENDOSCOPY;  Service: Gastroenterology;;   COLONOSCOPY  04/27/2006   Wohl-colitis, internal hemorrhoids   COLONOSCOPY  02/2013   focal right colitis, left colon biopsy negative, focal active proctitis without chronicity   COLONOSCOPY WITH PROPOFOL  N/A 07/16/2022   Procedure: COLONOSCOPY WITH PROPOFOL ;  Surgeon: Unk Corinn Skiff, MD;  Location: [facility] ENDOSCOPY;  Service: Gastroenterology;  Laterality: N/A;   ESOPHAGOGASTRODUODENOSCOPY  02/2013   multiple peptic & duodenal ulcers, negative h pylori   ESOPHAGOGASTRODUODENOSCOPY (EGD) WITH PROPOFOL  N/A 04/29/2023   Procedure: ESOPHAGOGASTRODUODENOSCOPY (EGD) WITH PROPOFOL ;  Surgeon: Unk Corinn Skiff, MD;  Location: [facility] ENDOSCOPY;  Service: Gastroenterology;  Laterality: N/A;   INTRAUTERINE DEVICE INSERTION  2012   LAPAROSCOPIC ENDOMETRIOSIS FULGURATION  2015   [name]   OVARIAN CYST REMOVAL      Family History  Problem Relation Age of Onset   Breast cancer Mother 44   COPD Father    Lung cancer Father 46   Dementia Father    Diabetes Sister    Cervical cancer Sister    Diabetes Brother    Lymphoma Maternal Grandmother 46  Colon cancer Maternal Grandfather 39   Stomach cancer Paternal Grandmother    Lung cancer Paternal Grandfather    Liver disease Maternal Aunt        ?etiology   Ovarian cancer Paternal Aunt        68s   Lung cancer Other 21       maternal great grandfather   Stomach cancer Other 37       maternal great grandmother    Social History   Socioeconomic History   Marital status: Single    Spouse name: Not on file   Number of children: 0   Years of education: Not on file   Highest education level: 10th grade  Occupational History   Not on file  Tobacco Use   Smoking status: Never   Smokeless tobacco: Never  Vaping Use   Vaping status: Never Used  Substance and Sexual Activity   Alcohol use: Not  Currently    Alcohol/week: 0.0 standard drinks of alcohol   Drug use: No   Sexual activity: Not Currently  Other Topics Concern   Not on file  Social History Narrative   She has brother, half brother & sister live nearby   Both parents deceased   Care managed by [facility]   Social Drivers of Health   Financial Resource Strain: Low Risk  (10/14/2023)   Received from [facility]   Overall Financial Resource Strain (CARDIA)    Difficulty of Paying Living Expenses: Not very hard  Food Insecurity: No Food Insecurity (10/14/2023)   Received from [facility]   Hunger Vital Sign    Within the past 12 months, you worried that your food would run out before you got the money to buy more.: Never true    Within the past 12 months, the food you bought just didn't last and you didn't have money to get more.: Never true  Transportation Needs: No Transportation Needs (10/14/2023)   [facility] - [facility]    In the past 12 months, has lack of transportation kept you from medical appointments or from getting medications?: No    Lack of Transportation (Non-Medical): No  Physical Activity: Inactive (02/11/2017)   Exercise Vital Sign    Days of Exercise per Week: 0 days    Minutes of Exercise per Session: 0 min  Stress: No Stress Concern Present (02/11/2017)   [employer] - Occupational Stress Questionnaire    Feeling of Stress : Not at all  Social Connections: Unknown (02/11/2017)   Social Connection and Isolation Panel    Frequency of Communication with Friends and Family: Patient declined    Frequency of Social Gatherings with Friends and Family: Patient declined    Attends Religious Services: Patient declined    Database Administrator or Organizations: Patient declined    Attends Banker Meetings: Patient declined    Marital Status: Patient declined  Intimate Partner  Violence: Unknown (02/11/2017)   Humiliation, Afraid, Rape, and Kick questionnaire    Fear of Current or Ex-Partner: Patient declined    Emotionally Abused: Patient declined    Physically Abused: Patient declined    Sexually Abused: Patient declined    Current Outpatient Medications on File Prior to Visit  Medication Sig Dispense Refill   acetaminophen  (TYLENOL ) 500 MG tablet Take 1 tablet (500 mg total) by mouth every 6 (six) hours as needed for mild pain (pain score 1-3)  or moderate pain (pain score 4-6). 30 tablet 5   albuterol  (VENTOLIN  HFA) 108 (90 Base) MCG/ACT inhaler Inhale 1-2 puffs into the lungs every 6 (six) hours as needed for wheezing or shortness of breath. 8 g 5   benzonatate  (TESSALON ) 200 MG capsule Take 200 mg by mouth 2 (two) times daily as needed for cough.     buPROPion  (WELLBUTRIN  XL) 150 MG 24 hr tablet TAKE 1 TABLET BY MOUTH IN THE MORNING 30 tablet 5   COMBIVENT  RESPIMAT 20-100 MCG/ACT AERS respimat Inhale 1 puff into the lungs every 6 (six) hours as needed for wheezing or shortness of breath. 4 g 5   cyanocobalamin  (VITAMIN B12) 1000 MCG tablet Take 1 tablet (1,000 mcg total) by mouth daily. 90 tablet 2   diphenhydrAMINE  (BENADRYL ) 25 mg capsule Take 25 mg by mouth every 6 (six) hours as needed for allergies.     diphenoxylate -atropine  (LOMOTIL ) 2.5-0.025 MG tablet Take 1 tablet by mouth 4 (four) times daily as needed for diarrhea or loose stools. 30 tablet 3   docusate sodium  (COLACE) 100 MG capsule TAKE 1 CAPSULE BY MOUTH ONCE DAILY FOR STOOL SOFTENER 90 capsule 1   EPINEPHrine  0.3 mg/0.3 mL IJ SOAJ injection USE FOR ANAPHYLAXIS 1 each 5   fluticasone  (FLONASE ) 50 MCG/ACT nasal spray Place 2 sprays into both nostrils daily. 16 g 6   levocetirizine (XYZAL ) 5 MG tablet Take 1 tablet (5 mg total) by mouth every evening. 90 tablet 1   linaclotide  (LINZESS ) 290 MCG CAPS capsule Take 1 capsule (290 mcg total) by mouth daily before breakfast. 90 capsule 3    norgestimate -ethinyl estradiol  (SPRINTEC 28) 0.25-35 MG-MCG tablet Take 1 tablet by mouth daily. 84 tablet 0   nortriptyline (PAMELOR) 10 MG capsule TAKE 10 MG (1 CAPSULE) BY MOUTH FOR ONE WEEK, THEN INCREASE TO 20 MG (2 CAPSULES) NIGHTLY FOR HEADACHES     ondansetron  (ZOFRAN ) 8 MG tablet Take 8 mg by mouth every 8 (eight) hours as needed for refractory nausea / vomiting.     polyethylene glycol powder (GLYCOLAX /MIRALAX ) 17 GM/SCOOP powder Take 17 g by mouth daily. 238 g 3   topiramate  (TOPAMAX ) 50 MG tablet TAKE 1 TABLET BY MOUTH EVERY EVENING 30 tablet 5   traMADol  (ULTRAM ) 50 MG tablet ONE TAB PO BID PRN FOR PAIN AND HEADCAHES 60 tablet 2   triamcinolone  cream (KENALOG ) 0.1 % Apply 1 Application topically 2 (two) times daily as needed (itching and rash). 453.6 g 1   Vitamin D , Ergocalciferol , (DRISDOL ) 1.25 MG (50000 UNIT) CAPS capsule Take 1 capsule (50,000 Units total) by mouth every 7 (seven) days. 12 capsule 3   No current facility-administered medications on file prior to visit.    Allergies  Allergen Reactions   Other Anaphylaxis    Peanut butter, uses Epi pen   Peanut Butter Flavoring Agent (Non-Screening) Swelling    Throat swells Throat swells   Sertraline  Other (See Comments)    Panic attacks   Codeine  Nausea And Vomiting    nausea   Paroxetine Other (See Comments)    Panic Attack    Paroxetine Hcl Other (See Comments)    Panic attack Other reaction(s): Other (See Comments) Panic attacks   Prednisone  & Diphenhydramine  Rash   Venlafaxine Other (See Comments)    Panic Attack    Zoloft  [Sertraline  Hcl] Other (See Comments)    Panic attack     Review of Systems Constitutional: negative for chills, fatigue, fevers and sweats Eyes: negative for irritation,  redness and visual disturbance Ears, nose, mouth, throat, and face: negative for hearing loss, nasal congestion, snoring and tinnitus Respiratory: negative for asthma, cough, sputum Cardiovascular: negative for  chest pain, dyspnea, exertional chest pressure/discomfort, irregular heart beat, palpitations and syncope Gastrointestinal: negative for abdominal pain, change in bowel habits, nausea and vomiting Genitourinary: negative for abnormal menstrual periods, genital lesions, sexual problems and vaginal discharge, dysuria and urinary incontinence Integument/breast: negative for breast lump, breast tenderness and nipple discharge Hematologic/lymphatic: negative for bleeding and easy bruising Musculoskeletal:negative for back pain and muscle weakness Neurological: negative for dizziness, headaches, vertigo and weakness Endocrine: negative for diabetic symptoms including polydipsia, polyuria and skin dryness Allergic/Immunologic: negative for hay fever and urticaria      Objective:  Blood pressure 110/77, pulse 81, resp. rate 16, height 5' 3 (1.6 m), weight 189 lb 3.2 oz (85.8 kg), last menstrual period 12/06/2015. Body mass index is 33.52 kg/m.    General Appearance:    Alert, cooperative, no distress, appears stated age  Throat:   Lips, mucosa, and tongue normal; teeth and gums normal  Back:     Symmetric, no curvature, ROM normal, no CVA tenderness  Lungs:     Clear to auscultation bilaterally, respirations unlabored  Chest Wall:    No tenderness or deformity   Heart:    Regular rate and rhythm, S1 and S2 normal, no murmur, rub or gallop  Breast Exam:    No tenderness, masses, or nipple abnormality  Abdomen:     Soft, non-tender, bowel sounds active all four quadrants, no masses, no organomegaly.    Genitalia:    deferred  Rectal:    deferred   Labs:  Lab Results  Component Value Date   WBC 4.4 01/21/2024   HGB 13.1 01/21/2024   HCT 40.2 01/21/2024   MCV 87 01/21/2024   PLT 187 01/21/2024    Lab Results  Component Value Date   CREATININE 0.68 01/21/2024   BUN 6 01/21/2024   NA 140 01/21/2024   K 4.2 01/21/2024   CL 105 01/21/2024   CO2 23 01/21/2024    Lab Results  Component  Value Date   ALT 11 01/21/2024   AST 11 01/21/2024   ALKPHOS 109 01/21/2024   BILITOT 0.4 01/21/2024    Lab Results  Component Value Date   TSH 2.780 05/07/2023     Assessment:   No diagnosis found.   Plan:  Blood tests: Labs UTD. Breast self exam technique reviewed and patient encouraged to perform self-exam monthly. Contraception: status post hysterectomy. Discussed healthy lifestyle modifications. Mammogram scheduled for 12/18. Strong family history of breast cancer.  Pap smear : Not medically necessary. Follow up in 1 year for annual exam  -Will continue OCPs to suppress probable endometriosis pain and progression.   Damien Parsley, CNM [facility]

## 2024-03-15 ENCOUNTER — Telehealth: Payer: Self-pay | Admitting: Nurse Practitioner

## 2024-03-15 NOTE — Telephone Encounter (Signed)
 Patient called to f/u on [employer] and transportation form. She stated she gave them to Alyssa. She verified she has paperwork and will have them completed soon. I notified patient-Teresa Crawford

## 2024-03-16 ENCOUNTER — Telehealth: Payer: Self-pay | Admitting: Nurse Practitioner

## 2024-03-16 NOTE — Telephone Encounter (Signed)
 Lvm notifying patient that the [facility] & Transit forms are completed & she can p/u at front desk. Scanned-Toni

## 2024-03-23 ENCOUNTER — Ambulatory Visit
Admission: RE | Admit: 2024-03-23 | Discharge: 2024-03-23 | Disposition: A | Discharge status: Home / Self Care | Source: Ambulatory Visit | Attending: Nurse Practitioner | Admitting: Nurse Practitioner

## 2024-03-23 DIAGNOSIS — Z1231 Encounter for screening mammogram for malignant neoplasm of breast: Secondary | ICD-10-CM | POA: Diagnosis present

## 2024-03-23 DIAGNOSIS — Z803 Family history of malignant neoplasm of breast: Secondary | ICD-10-CM | POA: Diagnosis not present

## 2024-03-27 ENCOUNTER — Encounter: Payer: Self-pay | Admitting: Nurse Practitioner

## 2024-03-29 NOTE — Progress Notes (Unsigned)
 "  THERAPIST PROGRESS NOTE  Virtual Visit via Video Note  I connected with Teresa Crawford on 03/29/2024 at 11:00 AM EST by a video enabled telemedicine application and verified that I am speaking with the correct person using two identifiers.  Location: Patient: Address on file  Provider: Providers Address   I discussed the limitations of evaluation and management by telemedicine and the availability of in person appointments. The patient expressed understanding and agreed to proceed.  I discussed the assessment and treatment plan with the patient. The patient was provided an opportunity to ask questions and all were answered. The patient agreed with the plan and demonstrated an understanding of the instructions.   The patient was advised to call back or seek an in-person evaluation if the symptoms worsen or if the condition fails to improve as anticipated.  I provided of non-face-to-face time during this encounter.   Teresa Crawford Husband, LCSW  Session Time: 11-11:43am  Participation Level: Active  Behavioral Response: CasualAlertNegative and Depressed  Type of Therapy: Individual Therapy  Treatment Goals addressed:  Active     Anxiety     LTG: Latorria will score less than 5 on the Generalized Anxiety Disorder 7 Scale (GAD-7)  (Progressing)     Start:  10/15/23    Expected End:  05/20/24       Goal Note     02/18/24: Reports she has found positive distractions, talking through her worries with her support system, and deep breathing.            STG: [place]hee will practice problem solving skills 3 times per week for the next 4 weeks.  (Progressing)     Start:  10/15/23    Expected End:  05/20/24         Perform psychoeducation regarding anxiety disorders     Start:  10/15/23         Provide Teresa with educational information and reading material on anxiety, its causes, and symptoms.      Start:  10/15/23         Coping Skills     Start:  10/15/23        Will work with the pt using CBT/DBT techniques to help the pt verbalize an understanding of the cognitive, physiological, and behavioral components of anxiety and its treatment. This will be done by using worksheets, interactive activities, CBT/ABC thought logs, modeling, homework, role playing and journaling. Will work with pt to learn and implement coping skills that result in a reduction of anxiety and improve daily functioning per pt self-report 3 out of 5 documented sessions.          ProgressTowards Goals: Progressing  Interventions: Supportive and Other: Grief work  Summary:  Teresa Crawford is a 38 y.o. female who presents with symptoms of anxiety and bereavement. Patient identifies symptoms to include uncontrollable worry, low mood, and fatigue. Pt was oriented times 5. Pt was cooperative and engaged. Pt denies SI/HI/AVH.   The patient identified that she is stuck in a state of denial regarding her grief over the unexpected passing of her partner. She reports experiencing panic attacks whenever she recalls memories of their time together. We worked together to challenge her desire to suppress her feelings of grief. I helped her to name her feelings and thoughts related to her grief. Reflected on advice and support others have shown her this holiday season.   Reflected on improvements with her self love, self care, and acceptance of doing  things alone.   Patient reflected on improvements with her trauma memories and her trauma symptoms.    Suicidal/Homicidal: Nowithout intent/plan  Therapist Response: Clinician utilized active and supportive reflection to create a safe environment for patient to process recent life stressors. Clinician assessed for current stressors, symptoms, and safety since last session. Supported patient through reflection of her grieving process. Explored ways she can change her habits or routines when she experiences severe sadness due reminders.    Plan: Return again in 4 weeks. Cln discussed patient referred for grief therapy with patient agreeing.    Diagnosis: Recurrent major depressive disorder, in full remission  Posttraumatic stress disorder  Intellectual disability   Collaboration of Care: AEB psychiatrist can access notes and cln. Will review psychiatrists' notes. Check in with the patient and will see LCSW per availability. Patient agreed with treatment recommendations.   Patient/Guardian was advised Release of Information must be obtained prior to any record release in order to collaborate their care with an outside provider. Patient/Guardian was advised if they have not already done so to contact the registration department to sign all necessary forms in order for us  to release information regarding their care.   Consent: Patient/Guardian gives verbal consent for treatment and assignment of benefits for services provided during this visit. Patient/Guardian expressed understanding and agreed to proceed.   Teresa Crawford Husband, LCSW 03/29/2024  "

## 2024-03-31 ENCOUNTER — Ambulatory Visit: Admitting: Licensed Clinical Social Worker

## 2024-03-31 DIAGNOSIS — F79 Unspecified intellectual disabilities: Secondary | ICD-10-CM | POA: Diagnosis not present

## 2024-03-31 DIAGNOSIS — F3342 Major depressive disorder, recurrent, in full remission: Secondary | ICD-10-CM | POA: Diagnosis not present

## 2024-03-31 DIAGNOSIS — F4310 Post-traumatic stress disorder, unspecified: Secondary | ICD-10-CM

## 2024-04-03 ENCOUNTER — Encounter: Payer: Self-pay | Admitting: Licensed Clinical Social Worker

## 2024-05-05 ENCOUNTER — Ambulatory Visit (INDEPENDENT_AMBULATORY_CARE_PROVIDER_SITE_OTHER): Admitting: Licensed Clinical Social Worker

## 2024-05-05 DIAGNOSIS — F79 Unspecified intellectual disabilities: Secondary | ICD-10-CM | POA: Diagnosis not present

## 2024-05-05 DIAGNOSIS — F4310 Post-traumatic stress disorder, unspecified: Secondary | ICD-10-CM

## 2024-05-05 DIAGNOSIS — F3342 Major depressive disorder, recurrent, in full remission: Secondary | ICD-10-CM
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
## 2024-05-08 ENCOUNTER — Telehealth: Admitting: Psychiatry

## 2024-05-08 ENCOUNTER — Encounter: Payer: Self-pay | Admitting: Psychiatry

## 2024-05-08 DIAGNOSIS — F3342 Major depressive disorder, recurrent, in full remission: Secondary | ICD-10-CM | POA: Diagnosis not present

## 2024-05-08 DIAGNOSIS — F79 Unspecified intellectual disabilities: Secondary | ICD-10-CM

## 2024-05-08 DIAGNOSIS — F4310 Post-traumatic stress disorder, unspecified: Secondary | ICD-10-CM | POA: Diagnosis not present

## 2024-06-09 ENCOUNTER — Ambulatory Visit: Admitting: Licensed Clinical Social Worker

## 2024-06-12 ENCOUNTER — Ambulatory Visit: Admitting: Licensed Clinical Social Worker

## 2024-06-14 ENCOUNTER — Ambulatory Visit: Admitting: Nurse Practitioner

## 2024-09-04 ENCOUNTER — Telehealth: Admitting: Psychiatry

## 2025-01-15 ENCOUNTER — Ambulatory Visit: Admitting: Nurse Practitioner
